# Patient Record
Sex: Male | Born: 1965 | Race: Black or African American | Hispanic: No | Marital: Married | Smoking: Former smoker
Health system: Southern US, Community
[De-identification: ages and names within clinical notes are randomized; demographics above are authoritative.]

## PROBLEM LIST (undated history)

## (undated) DIAGNOSIS — D472 Monoclonal gammopathy: Secondary | ICD-10-CM

## (undated) DIAGNOSIS — E669 Obesity, unspecified: Secondary | ICD-10-CM

## (undated) DIAGNOSIS — E785 Hyperlipidemia, unspecified: Secondary | ICD-10-CM

## (undated) DIAGNOSIS — I10 Essential (primary) hypertension: Secondary | ICD-10-CM

## (undated) HISTORY — PX: EYE SURGERY: SHX253

## (undated) HISTORY — DX: Hyperlipidemia, unspecified: E78.5

## (undated) HISTORY — DX: Obesity, unspecified: E66.9

## (undated) HISTORY — DX: Essential (primary) hypertension: I10

## (undated) HISTORY — PX: TONSILLECTOMY: SUR1361

## (undated) HISTORY — DX: Monoclonal gammopathy: D47.2

## (undated) HISTORY — PX: APPENDECTOMY: SHX54

---

## 2000-11-13 ENCOUNTER — Emergency Department (HOSPITAL_COMMUNITY): Admission: EM | Admit: 2000-11-13 | Discharge: 2000-11-13 | Payer: Self-pay

## 2001-05-12 ENCOUNTER — Inpatient Hospital Stay (HOSPITAL_COMMUNITY): Admission: EM | Admit: 2001-05-12 | Discharge: 2001-05-13 | Payer: Self-pay | Admitting: Emergency Medicine

## 2001-05-12 ENCOUNTER — Encounter: Payer: Self-pay | Admitting: Emergency Medicine

## 2001-05-12 ENCOUNTER — Encounter (INDEPENDENT_AMBULATORY_CARE_PROVIDER_SITE_OTHER): Payer: Self-pay | Admitting: *Deleted

## 2001-12-08 ENCOUNTER — Emergency Department (HOSPITAL_COMMUNITY): Admission: EM | Admit: 2001-12-08 | Discharge: 2001-12-08 | Payer: Self-pay | Admitting: Emergency Medicine

## 2006-12-20 ENCOUNTER — Ambulatory Visit (HOSPITAL_BASED_OUTPATIENT_CLINIC_OR_DEPARTMENT_OTHER): Admission: RE | Admit: 2006-12-20 | Discharge: 2006-12-20 | Payer: Self-pay | Admitting: Ophthalmology

## 2007-04-18 ENCOUNTER — Ambulatory Visit (HOSPITAL_BASED_OUTPATIENT_CLINIC_OR_DEPARTMENT_OTHER): Admission: RE | Admit: 2007-04-18 | Discharge: 2007-04-18 | Payer: Self-pay | Admitting: Ophthalmology

## 2008-12-16 ENCOUNTER — Ambulatory Visit: Payer: Self-pay | Admitting: Oncology

## 2008-12-24 LAB — CBC WITH DIFFERENTIAL/PLATELET
BASO%: 0.5 % (ref 0.0–2.0)
Eosinophils Absolute: 0.1 10*3/uL (ref 0.0–0.5)
HCT: 38.8 % (ref 38.4–49.9)
LYMPH%: 31.7 % (ref 14.0–49.0)
MCHC: 34.5 g/dL (ref 32.0–36.0)
MONO#: 0.5 10*3/uL (ref 0.1–0.9)
NEUT#: 2.9 10*3/uL (ref 1.5–6.5)
Platelets: 292 10*3/uL (ref 140–400)
RBC: 4.23 10*6/uL (ref 4.20–5.82)
WBC: 5.2 10*3/uL (ref 4.0–10.3)
lymph#: 1.6 10*3/uL (ref 0.9–3.3)

## 2008-12-24 LAB — CHCC SMEAR

## 2008-12-25 ENCOUNTER — Ambulatory Visit (HOSPITAL_COMMUNITY): Admission: RE | Admit: 2008-12-25 | Discharge: 2008-12-25 | Payer: Self-pay | Admitting: Oncology

## 2008-12-26 LAB — SPEP & IFE WITH QIG
Albumin ELP: 49.8 % — ABNORMAL LOW (ref 55.8–66.1)
Beta 2: 2.6 % — ABNORMAL LOW (ref 3.2–6.5)
Beta Globulin: 4 % — ABNORMAL LOW (ref 4.7–7.2)
Gamma Globulin: 30.1 % — ABNORMAL HIGH (ref 11.1–18.8)
IgA: 163 mg/dL (ref 68–378)
IgM, Serum: 2790 mg/dL — ABNORMAL HIGH (ref 60–263)

## 2008-12-26 LAB — COMPREHENSIVE METABOLIC PANEL
ALT: 18 U/L (ref 0–53)
AST: 15 U/L (ref 0–37)
Alkaline Phosphatase: 54 U/L (ref 39–117)
BUN: 14 mg/dL (ref 6–23)
Creatinine, Ser: 1.13 mg/dL (ref 0.40–1.50)
Total Bilirubin: 0.4 mg/dL (ref 0.3–1.2)

## 2008-12-26 LAB — KAPPA/LAMBDA LIGHT CHAINS
Kappa:Lambda Ratio: 20.22 — ABNORMAL HIGH (ref 0.26–1.65)
Lambda Free Lght Chn: 1.37 mg/dL (ref 0.57–2.63)

## 2008-12-26 LAB — BETA 2 MICROGLOBULIN, SERUM: Beta-2 Microglobulin: 1.59 mg/L (ref 1.01–1.73)

## 2008-12-31 ENCOUNTER — Ambulatory Visit (HOSPITAL_COMMUNITY): Admission: RE | Admit: 2008-12-31 | Discharge: 2008-12-31 | Payer: Self-pay | Admitting: Oncology

## 2009-01-13 ENCOUNTER — Ambulatory Visit: Payer: Self-pay | Admitting: Oncology

## 2009-01-14 LAB — RHEUMATOID FACTOR: Rhuematoid fact SerPl-aCnc: <20 IU/mL (ref 0–20)

## 2009-01-14 LAB — HEPATITIS B SURFACE ANTIGEN: Hepatitis B Surface Ag: NEGATIVE

## 2009-01-14 LAB — HEPATITIS B SURFACE ANTIBODY,QUALITATIVE: Hep B S Ab: NEGATIVE

## 2009-05-08 ENCOUNTER — Ambulatory Visit: Payer: Self-pay | Admitting: Oncology

## 2009-05-13 LAB — CBC WITH DIFFERENTIAL/PLATELET
Basophils Absolute: 0 10*3/uL (ref 0.0–0.1)
Eosinophils Absolute: 0.1 10*3/uL (ref 0.0–0.5)
HCT: 39.5 % (ref 38.4–49.9)
LYMPH%: 29.4 % (ref 14.0–49.0)
MCV: 91.2 fL (ref 79.3–98.0)
MONO%: 9.9 % (ref 0.0–14.0)
NEUT#: 3.1 10*3/uL (ref 1.5–6.5)
NEUT%: 57.8 % (ref 39.0–75.0)
Platelets: 277 10*3/uL (ref 140–400)
RBC: 4.34 10*6/uL (ref 4.20–5.82)

## 2009-05-15 LAB — SPEP & IFE WITH QIG
Beta 2: 3.5 % (ref 3.2–6.5)
Beta Globulin: 4.3 % — ABNORMAL LOW (ref 4.7–7.2)
Gamma Globulin: 31.3 % — ABNORMAL HIGH (ref 11.1–18.8)
IgA: 147 mg/dL (ref 68–378)
IgM, Serum: 2320 mg/dL — ABNORMAL HIGH (ref 60–263)

## 2009-05-15 LAB — KAPPA/LAMBDA LIGHT CHAINS
Kappa free light chain: 44 mg/dL — ABNORMAL HIGH (ref 0.33–1.94)
Kappa:Lambda Ratio: 18.8 — ABNORMAL HIGH (ref 0.26–1.65)
Lambda Free Lght Chn: 2.34 mg/dL (ref 0.57–2.63)

## 2009-05-15 LAB — COMPREHENSIVE METABOLIC PANEL
Alkaline Phosphatase: 50 U/L (ref 39–117)
BUN: 16 mg/dL (ref 6–23)
CO2: 23 mEq/L (ref 19–32)
Creatinine, Ser: 1.03 mg/dL (ref 0.40–1.50)
Glucose, Bld: 167 mg/dL — ABNORMAL HIGH (ref 70–99)
Sodium: 133 mEq/L — ABNORMAL LOW (ref 135–145)
Total Bilirubin: 0.4 mg/dL (ref 0.3–1.2)

## 2009-11-13 ENCOUNTER — Ambulatory Visit: Payer: Self-pay | Admitting: Oncology

## 2009-11-17 LAB — CBC WITH DIFFERENTIAL/PLATELET
Basophils Absolute: 0 10*3/uL (ref 0.0–0.1)
EOS%: 0.8 % (ref 0.0–7.0)
Eosinophils Absolute: 0.1 10*3/uL (ref 0.0–0.5)
HGB: 12.9 g/dL — ABNORMAL LOW (ref 13.0–17.1)
MCH: 30.9 pg (ref 27.2–33.4)
MONO#: 0.6 10*3/uL (ref 0.1–0.9)
NEUT#: 3.7 10*3/uL (ref 1.5–6.5)
RDW: 14.3 % (ref 11.0–14.6)
WBC: 5.9 10*3/uL (ref 4.0–10.3)
lymph#: 1.6 10*3/uL (ref 0.9–3.3)

## 2009-11-19 LAB — COMPREHENSIVE METABOLIC PANEL
AST: 21 U/L (ref 0–37)
Albumin: 4.3 g/dL (ref 3.5–5.2)
BUN: 21 mg/dL (ref 6–23)
CO2: 22 mEq/L (ref 19–32)
Calcium: 9.7 mg/dL (ref 8.4–10.5)
Chloride: 100 mEq/L (ref 96–112)
Potassium: 4.3 mEq/L (ref 3.5–5.3)

## 2009-11-19 LAB — SPEP & IFE WITH QIG
Albumin ELP: 49 % — ABNORMAL LOW (ref 55.8–66.1)
Alpha-1-Globulin: 5.4 % — ABNORMAL HIGH (ref 2.9–4.9)
IgM, Serum: 3090 mg/dL — ABNORMAL HIGH (ref 60–263)

## 2009-11-19 LAB — KAPPA/LAMBDA LIGHT CHAINS: Lambda Free Lght Chn: 2.19 mg/dL (ref 0.57–2.63)

## 2009-11-27 ENCOUNTER — Ambulatory Visit (HOSPITAL_COMMUNITY): Admission: RE | Admit: 2009-11-27 | Discharge: 2009-11-27 | Payer: Self-pay | Admitting: Oncology

## 2010-05-17 ENCOUNTER — Ambulatory Visit: Payer: Self-pay | Admitting: Oncology

## 2010-05-26 LAB — CBC WITH DIFFERENTIAL/PLATELET
BASO%: 0.5 % (ref 0.0–2.0)
LYMPH%: 32.3 % (ref 14.0–49.0)
MCHC: 34.6 g/dL (ref 32.0–36.0)
MCV: 91.9 fL (ref 79.3–98.0)
MONO#: 0.5 10*3/uL (ref 0.1–0.9)
MONO%: 10.4 % (ref 0.0–14.0)
NEUT#: 2.5 10*3/uL (ref 1.5–6.5)
Platelets: 249 10*3/uL (ref 140–400)
RBC: 4.04 10*6/uL — ABNORMAL LOW (ref 4.20–5.82)
RDW: 15.8 % — ABNORMAL HIGH (ref 11.0–14.6)
WBC: 4.6 10*3/uL (ref 4.0–10.3)

## 2010-05-28 LAB — SPEP & IFE WITH QIG
Albumin ELP: 49.4 % — ABNORMAL LOW (ref 55.8–66.1)
IgA: 361 mg/dL (ref 68–378)
IgM, Serum: 3150 mg/dL — ABNORMAL HIGH (ref 60–263)
Total Protein, Serum Electrophoresis: 8.9 g/dL — ABNORMAL HIGH (ref 6.0–8.3)

## 2010-05-28 LAB — COMPREHENSIVE METABOLIC PANEL
ALT: 15 U/L (ref 0–53)
Albumin: 4.1 g/dL (ref 3.5–5.2)
Alkaline Phosphatase: 52 U/L (ref 39–117)
Potassium: 4.2 mEq/L (ref 3.5–5.3)
Sodium: 137 mEq/L (ref 135–145)
Total Bilirubin: 0.4 mg/dL (ref 0.3–1.2)
Total Protein: 8.9 g/dL — ABNORMAL HIGH (ref 6.0–8.3)

## 2010-11-24 ENCOUNTER — Encounter (HOSPITAL_BASED_OUTPATIENT_CLINIC_OR_DEPARTMENT_OTHER): Payer: 59 | Admitting: Oncology

## 2010-11-24 ENCOUNTER — Other Ambulatory Visit: Payer: Self-pay | Admitting: Oncology

## 2010-11-24 DIAGNOSIS — R059 Cough, unspecified: Secondary | ICD-10-CM

## 2010-11-24 DIAGNOSIS — R0609 Other forms of dyspnea: Secondary | ICD-10-CM

## 2010-11-24 DIAGNOSIS — D892 Hypergammaglobulinemia, unspecified: Secondary | ICD-10-CM

## 2010-11-24 DIAGNOSIS — D472 Monoclonal gammopathy: Secondary | ICD-10-CM

## 2010-11-24 DIAGNOSIS — R0989 Other specified symptoms and signs involving the circulatory and respiratory systems: Secondary | ICD-10-CM

## 2010-11-24 DIAGNOSIS — R05 Cough: Secondary | ICD-10-CM

## 2010-11-24 LAB — CBC WITH DIFFERENTIAL/PLATELET
Basophils Absolute: 0 10*3/uL (ref 0.0–0.1)
HCT: 35.5 % — ABNORMAL LOW (ref 38.4–49.9)
HGB: 12.2 g/dL — ABNORMAL LOW (ref 13.0–17.1)
MONO#: 0.4 10*3/uL (ref 0.1–0.9)
NEUT%: 49.9 % (ref 39.0–75.0)
WBC: 4.1 10*3/uL (ref 4.0–10.3)
lymph#: 1.5 10*3/uL (ref 0.9–3.3)

## 2010-11-26 LAB — COMPREHENSIVE METABOLIC PANEL
ALT: 17 U/L (ref 0–53)
BUN: 18 mg/dL (ref 6–23)
CO2: 24 mEq/L (ref 19–32)
Calcium: 10.1 mg/dL (ref 8.4–10.5)
Chloride: 100 mEq/L (ref 96–112)
Creatinine, Ser: 1.15 mg/dL (ref 0.40–1.50)

## 2010-11-26 LAB — SPEP & IFE WITH QIG
Alpha-1-Globulin: 3.1 % (ref 2.9–4.9)
Alpha-2-Globulin: 6.7 % — ABNORMAL LOW (ref 7.1–11.8)
Beta Globulin: 3.8 % — ABNORMAL LOW (ref 4.7–7.2)
IgG (Immunoglobin G), Serum: 1270 mg/dL (ref 694–1618)
M-Spike, %: 2.71 g/dL
Total Protein, Serum Electrophoresis: 9.5 g/dL — ABNORMAL HIGH (ref 6.0–8.3)

## 2010-11-26 LAB — KAPPA/LAMBDA LIGHT CHAINS
Kappa free light chain: 76.7 mg/dL — ABNORMAL HIGH (ref 0.33–1.94)
Kappa:Lambda Ratio: 35.51 — ABNORMAL HIGH (ref 0.26–1.65)

## 2011-01-18 NOTE — Op Note (Signed)
[name], Edward Clarke             ACCOUNT NO.:  [id number]   MEDICAL RECORD NO.:  [id number]          PATIENT TYPE:  AMB   LOCATION:  [facility]                         FACILITY:  [facility]   PHYSICIAN:  Tyrone Apple. 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  This procedure is indicated to correct the  residual exotropia.  The risks and benefits of the procedure explained  to the patient prior to procedure and informed consent was obtained.   OPERATION PERFORMED:  A left superior rectus tuck, a left inferior  rectus tuck.   SURGEON:  Tyrone Apple. Karleen Hampshire, M.D.   ANESTHESIA:  General with intubation.   POSTOPERATIVE DIAGNOSIS:  Status post left inferior rectus, left  superior rectus tuck.   DESCRIPTION OF PROCEDURE:  The patient was taken to the operating room  and placed in supine position.  The entire face was prepped and draped  in the usual sterile manner.  After the induction of general anesthesia  and establishment of endotracheal airway, my attention was first  directed to the left eye.  A lid speculum was placed and forced duction  test performed and found to be positive. An incision was then made  inferiorly in the inferior fornix and taken down to the posterior  subtenons space in the direction along the transposed left inferior  rectus tendon.  Dissection was carried progressively posteriorly very  delicately and meticulously to eventually expose the transposed tendon  and expose the left medial rectus insertion.  Once the transposed tendon  was exposed and a mark was placed on the tendon at 4 mm from its  transposed position, it was then imbricated at this point with 6-0  Vicryl suture taking two locking bites at the  ends.  It was then tucked  to the transposed insertion with the preplaced sutures.  Attention was  then directed to the left superior rectus and incision was made in the  superior medial fornix and taken down to the posterior subtenon's space  and the left superior rectus transposed tendon was carefully and  meticulously exposed in its transposed position for a distance of  approximately 4 mm.  It was then imbricated at this point on 6-0 Vicryl  suture taking two locking bites in the ends and then the transposed  tendon was then tucked on itself and reattached by the tuck to its  transposed position.  The suture was tied securely.  The conjunctiva was  repositioned.  At the conclusion of the procedure, TobraDex ointment was  instilled in inferior fornices of the left eye and there were no  apparent complications.      Casimiro Needle A. Karleen Hampshire, M.D.  Electronically Signed     MAS/MEDQ  D:  04/18/2007  T:  04/19/2007  Job:  [id number]

## 2011-01-21 NOTE — Op Note (Signed)
[place]  Patient:    Edward Clarke, Edward Clarke Visit Number: [id number] MRN: [id number]          Service Type: SUR Location: [id number] Attending Physician:  Velora Heckler Md Proc. Date: 05/12/01 Admit Date:  05/12/2001 Discharge Date: 05/13/2001                             Operative Report  PREOPERATIVE DIAGNOSIS:  Acute appendicitis.  POSTOPERATIVE DIAGNOSIS:  Acute appendicitis.  PROCEDURE: 1. Laparoscopic appendectomy. 2. Repair of small bowel enterotomy, laparoscopic technique.  SURGEON:  Velora Heckler, M.D.  ANESTHESIA:  General per Janetta Hora. Gelene Mink, M.D.  ESTIMATED BLOOD LOSS:  Minimal.  PREPARATION:  Betadine.  COMPLICATIONS:  Inadvertent enterotomy to small bowel loop which became entrapped between stapling device and left lower quadrant port.  Enterotomy was identified on the antimesenteric border, and was successfully closed using a laparoscopic stapling device.  INDICATIONS:  The patient is a 45 year old black male who presents to the emergency department with less than 24 hour history of abdominal pain localizing to the right lower quadrant.  White blood cell count was minimally elevated at 10.6, but with a significant left shift on differential.  CT scan of the abdomen identified findings consistent with acute appendicitis.  The patient is now brought to the operating room for appendectomy.  DESCRIPTION OF PROCEDURE:  The procedure was done in OR #17 at the [facility].  The patient was brought to the operating room and placed in the supine position on the operating room table.  Following the administration of general anesthesia, the patient was prepped and draped in the usual strict aseptic fashion.  After ascertaining that an adequate level of anesthesia had been obtained, a supraumbilical incision was made in the midline with a #11 blade.  Dissection was carried down through the subcutaneous  tissues.  The fascia was incised in the midline and the peritoneal cavity was entered cautiously.  An 0 Vicryl pursestring suture was placed in the fascia.  A Hasson cannula was introduced and secured with the pursestring suture.  The abdomen was insufflated with carbon dioxide.  The laparoscope was introduced and the abdomen explored.  A operative port was placed in the right upper quadrant, just below the costal margin.  Using a Glassman clamp, the cecum was mobilized.  There is an obvious inflamed, indurated appendix without evidence of perforation.  It is freely mobile.  An operative port was placed in the left lower quadrant using a 12 mm port.  A window was created at the base of the appendix.  The Endo-GIA stapling device was inserted and used to transect the base of the appendix.  The mesentery was mobilized and dissected out carefully using the electrocautery for hemostasis. A second load of the Endo-GIA with a vascular cartdrige is inserted and used to partially transect the appendiceal mesentery.  On withdrawal of the stapler from the peritoneal cavity, there is some resistance.  After removal of the stapler, and inspection of the port site, a loop of small bowel was identified and having been entrapped between the stapling device and the left lower quadrant port.  A small less than 1 cm enterotomy had been created on the antimesenteric border of the loop of small bowel.  Next, the Endo-GIA was reinserted with another vascular low cartdrige.  This was used to complete transection of the appendiceal mesentery.  The appendix  was then placed into an Endocatch bag and left in the right lower quadrant.  Next, the Endo-GIA was reinserted with a regular Endo-GIA cartridge.  The antimesenteric border enterotomy on the small bowel is grasped with a Glassman clamp and elevated.  The Endo-GIA was positioned across the antimesenteric border of the small bowel just below the enterotomy and  fired successfully. There appears to be good staple closure of the enterotomy.  The damaged tissue is excised completely.  There is no compromise of the small bowel lumen.  Good hemostasis was noted along the staple line.  Both the enterotomy closure and the site of appendectomy were irrigated copiously with warm saline which was evacuated.  Good hemostasis was noted on all staple lines.  The appendix was then extracted in the Endocatch bag through the left lower quadrant port and the port removed.  The appendix was submitted to pathology for review.  The port sites are examined as ports are removed for hemostasis.  Pneumoperitoneum was released and all ports were removed.  All three port sites are anesthetized with local anesthetic.  All the 0 Vicryl pursestring sutures tied securely.  All three wounds were closed with interrupted 4-0 Vicryl subcuticular sutures.  The wounds were washed and dried, and Benzoin and Steri-Strips were applied.  Sterile gauze dressings are applied.  The patient was awakened from anesthesia and brought to the recovery room in stable condition.  The patient tolerated the procedure well. Attending Physician:  Velora Heckler Md DD:  05/12/01 TD:  05/13/01 Job: [id number]

## 2011-01-21 NOTE — Op Note (Signed)
Edward Clarke, Edward Clarke             ACCOUNT NO.:  [id number]   MEDICAL RECORD NO.:  [id number]          PATIENT TYPE:  AMB   LOCATION:  [facility]                         FACILITY:  [facility]   PHYSICIAN:  Tyrone Apple. Karleen Hampshire, M.D.DATE OF BIRTH:  [birth year]   DATE OF PROCEDURE:  12/20/2006  DATE OF DISCHARGE:                               OPERATIVE REPORT   PREOPERATIVE DIAGNOSIS:  Left Doyne syndrome.   POSTOPERATIVE DIAGNOSIS:  Left Doyne syndrome.   OPERATIONS:  Full tendon transposition of the left inferior rectus and  left superior rectus to the left medial rectus on the spiral of Tillaux  with Malen Gauze augmentation, right lateral rectus resection with posterior  pulley fixation sutures;left lateral rectus recession with y-splitting.   SURGEON:  Tyrone Apple. Karleen Hampshire, M.D.   ANESTHESIA:  General with laryngeal mask airway.   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  This procedure is indicated to restore alignment of the visual  axis and restore single binocular vision and also to restore normal  ductions and versions in his ocular movements.  The risks and benefits  of the procedure were explained to the patient prior to the procedure  and informed consent was obtained.   DESCRIPTION OF PROCEDURE:  The patient was taken into the operating room  and placed in the supine position.  The entire face was prepped and  draped in the usual sterile manner.  After the induction of general  anesthesia and establishment of endotracheal intubation, our attention  was first directed to the right eye.  A lid speculum was placed and  forced duction tests were performed and found to be negative.  The globe  was then held in the inferior temporal quadrant.  The eye was elevated  and adducted.  An incision was made through the inferior  temporal  fornix, taken down to the posterior sub Tenons space.  The left lateral  rectus muscle was then isolated on a Stevens hook and subsequently a  Green hook.  The muscle tendon was then carefully dissected free from  its overlying muscle fascia and intramuscular septum for a distance of  approximately 16 mm.  The posterior pulley apparatus was then engaged  under direct visualization with two Stevens hooks and incorporated into  a 6-0 Vicryl suture.  This was done on the superior side of the lateral  rectus muscle at a distance of 16 mm.  The same suture was then used to  incorporate approximately 4 mm of the tendon at this point.  The suture  was tied securely.  Attention was directed to the contralateral side  where the posterior pulley fixation apparatus from the superior portion  of the same tendon was engaged on a 6-0 Vicryl suture.  It was  incorporated with the ipsilateral 4 mm segment of the tendon.  The  suture was tied securely.  The conjunctiva was repositioned.  My  attention was then directed to the recession.  The right lateral rectus  tendon was then imbricated on a 6-0 Vicryl suture taking two locking  bites of the ends.  It was then recessed exactly 5 mm and reattached to  the globe using the preplaced suture and the suture tied securely and  conjunctiva was repositioned.  My attention was then directed to the  left eye.  A lid speculum was placed.  Forced duction tests were  performed and found to be positive to adduction of that eye.  The globe  was held in the inferior nasal quadrant.  An incision was made through  the inferior nasal fornix and taken down to the posterior sub Tenon  space.  The left medial rectus muscle was isolated on a Stevens hook.  Next, the left inferior rectus muscle was then isolated on a Stevens  hook and subsequently a Green hook.  It was carefully dissected free  from its overlying muscle fascia and intramuscular septum for a distance   of approximately 18 mm taking care to carefully dissect free the capsule  palpebral fascia from the tendon.  A mark was then placed on the tendon  at 16 mm from its insertion and a 6-0 Vicryl suture was placed  incorporating the ipsilateral 4 mm segment of the tendon at this point.  This was placed on a Steri-Strip.  Next, the tendon was imbricated on a  6-0 Vicryl suture taking two locking bites at its insertion.  It was  then detached from the globe and the sutures were placed on a Steri-  Strip.  My attention was then directed to the superior rectus.  An  incision was made in the superior nasal fornix, taken down to the  posterior sub Tenon space and the left superior rectus tendon was then  carefully dissected free from its overlying muscle fascia and  intramuscular septum after being engaged on a Stevens hook and  subsequently on a Green hook.  The superior oblique tendon was also  avoided during this maneuver and the tendon was exposed free of the  superior oblique.  A mark was then placed on the tendon at 16 mm from  its insertion.  A 6-0 Vicryl suture was then placed at the 16 mm mark of  the ipsilateral aspect of the tendon.  This suture was placed on a  serrefine.  Next, the superior rectus was then imbricated on a 6-0  Vicryl suture taking two locking bites of the ends.  It was then  detached from the globe and transposed on the spiral of Tillaux at the  superior insertion of the ipsilateral medial rectus muscle.  Once the  transposition was completed, the preplaced 6-0 Vicryl suture at 16 mm of  the ipsilateral side was then used to incorporate a 4 mm segment of the  medial rectus tendon in the same position and this suture was tied  securely thereby completing the augmentation.  Next, the inferior rectus  tendon was then transposed to the inferior aspect of the medial rectus tendon.  The sutures were tied securely.  The augmentation of the  preplaced suture at 16 mm mark was  completed of the inferior rectus and  the medial rectus.  The conjunctiva was repositioned.  My attention was  then directed to the left lateral rectus tendon.  The globe was held in  the inferior temporal quadrant.  The eye was elevated and adducted.  Incision was made through the inferior temporal fornix, taken down to  the posterior sub  Tenon space.  The left lateral rectus tendon was then  isolated on a Stevens hook and subsequently on a Green hook.  This  tendon was found to be extremely tight, perhaps 4+ tightness with  multiple fibrotic bands from the insertion at the lateral rectus to the  posterior aspect of the orbit.  These bands were transected and the  tendon was exposed for a distance of approximately 16 mm.  It was then  imbricated on two 6-0 Vicryl sutures at each suture bisecting the tendon  into 5 mm segments.  The tendon was then transected free from the globe.  It was then bifurcated with sharp dissection for a distance of  approximately 16 mm.  It was then reattached to the globe in a recess  position approximately 20 mm separating between the two heads, recessed  approximately 5 mm from the native insertion of the lateral rectus  muscle.  The suture was tied securely and the conjunctiva was  repositioned.  At the conclusion of the procedure, TobraDex ointment was  instilled on the inferior fornices of both eyes.  There were no apparent  complications.      Casimiro Needle A. Karleen Hampshire, M.D.  Electronically Signed     MAS/MEDQ  D:  12/20/2006  T:  12/20/2006  Job:  [id number]

## 2011-01-21 NOTE — H&P (Signed)
[place]  Patient:    Edward Clarke, Edward Clarke Visit Number: [id number] MRN: [id number]          Service Type: SUR Location: [id number] Attending Physician:  Bonnetta Barry Dictated by:   Velora Heckler, M.D. Admit Date:  05/12/2001   CC:         Dr. _______   History and Physical  REASON FOR ADMISSION:  Acute appendicitis.  REFERRING PHYSICIAN:  Dr. ______ .  BRIEF HISTORY OF PRESENT ILLNESS:  The patient is a 45 year old black male who presents to the emergency department with less than 24-hour history of lower abdominal pain localizing to the right lower quadrant. This was associated with nausea. The patient notes chills, although he did not take his temperature at home. The patient had a similar such episode 11 months ago which resolved spontaneously. On examination, the patient was felt to have right lower quadrant tenderness. White blood cell count was minimally elevated at 10.6, but differential showed 87% segmented neutrophils. CT scan of the abdomen and pelvis was obtained which demonstrated findings consistent with acute appendicitis with a thickened 2 cm appendix in the right lower quadrant which did not fill with rectal contrast. General surgery is consulted now for appendectomy.  PAST MEDICAL HISTORY/PAST SURGICAL HISTORY:  Status post tonsillectomy, history of depression.  CURRENT MEDICATIONS:  Zoloft.  ALLERGIES:  None known.  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 He smokes a pack of cigarettes a day. 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 He does not drink alcohol. He is accompanied by his wife and stepchild.  FAMILY HISTORY:  Notable for diabetes in the patients mother.  REVIEW OF SYSTEMS:  Fifteen system review without significant other abnormality.  PHYSICAL EXAMINATION:  GENERAL:  The patient is a 45 year old, well-developed, well-nourished, black male  on a stretcher in the emergency department in mild to moderate discomfort.  VITAL SIGNS:  Temperature 98.4, pulse 72, respirations 20, blood pressure 148/89, O2 saturation 97%.  HEENT:  Normocephalic, atraumatic. Sclerae are clear. Dentition is good.  NECK:  Supple without masses. Thyroid is normal without nodularity.  LUNGS:  Clear to auscultation bilaterally.  CARDIAC:  Regular rate and rhythm without murmur.  EXTREMITIES:  Peripheral pulses are full. Nontender without edema.  ABDOMEN:  Soft. There are no surgical wounds. There are no bowel sounds present. There is tenderness to percussion across the lower abdomen but maximal at McBurneys point. There is tenderness to palpation in the right lower quadrant. There is mild rebound tenderness in the right lower quadrant. There is referred rebound tenderness from the left lower quadrant. There is no palpable mass.  GENITOURINARY:  Normal.  NEUROLOGICAL:  The patient is alert and oriented to person, place, and time without focal neurologic deficit.  LABORATORY DATA:  Complete blood count shows white count 10.6, hemoglobin 16, hematocrit 46.6%, platelet count 328,000. Differential shows 87% neutrophils, 10% lymphocytes. Chemistry profile is notable only for a glucose of 155, creatinine is normal at 0.9. Urinalysis is benign. Micro on urinalysis shows 7 to 10 white cells per high powered field and 0 to 2 red cells per high powered field.  CT scan abdomen and pelvis read by Sherrine Maples T. Fredia Sorrow, M.D. shows thickened appendix approximately 2 cm consistent with appendicitis. The appendix does not fill with rectal contrast.  IMPRESSION:  Probable acute appendicitis.  PLAN:  I had a lengthy discussion with the patient and his wife  regarding the indications for surgery. I explained laparoscopic versus open appendectomy. I quoted them approximately a 75 to 80% success rate with laparoscopic technique but a 20 to 25% chance that he would  need to be converted to an open procedure. We discussed potential postoperative complications including bleeding and infection. We discussed the potential for the need of open wound care. The patient and his wife understand and wish to proceed. We will make arrangements with the operating room for laparoscopic appendectomy this morning. Dictated by:   Velora Heckler, M.D. Attending Physician:  Bonnetta Barry DD:  05/12/01 TD:  05/12/01 Job: [id number]

## 2011-06-20 LAB — POCT I-STAT 4, (NA,K, GLUC, HGB,HCT)
Potassium: 4.5
Sodium: 139

## 2011-08-18 ENCOUNTER — Telehealth: Payer: Self-pay | Admitting: Oncology

## 2011-08-18 NOTE — Telephone Encounter (Signed)
Lvm advising appt 10/04/11 @ 3.30pm.

## 2011-09-27 ENCOUNTER — Encounter: Payer: Self-pay | Admitting: *Deleted

## 2011-10-03 ENCOUNTER — Telehealth: Payer: Self-pay | Admitting: *Deleted

## 2011-10-03 ENCOUNTER — Telehealth: Payer: Self-pay | Admitting: Oncology

## 2011-10-03 NOTE — Telephone Encounter (Signed)
pt called and asked that his appt on 01-29 be cancelled.  pt states that he will rtn call to r/s once he has insurance

## 2011-10-03 NOTE — Telephone Encounter (Signed)
Received call from pt stating that he has an appt in our office tomorrow 10/04/11 but he called months ago to cancel the appt due to change in insurance. Pt states he believes he is going to have to get medicare/medicaid to cover office visits. Informed pt to notify our office when ready to r/s. Verbalized understanding. Will notify MD

## 2011-10-04 ENCOUNTER — Telehealth: Payer: Self-pay | Admitting: Oncology

## 2011-10-04 ENCOUNTER — Other Ambulatory Visit: Payer: 59 | Admitting: Lab

## 2011-10-04 ENCOUNTER — Ambulatory Visit: Payer: 59 | Admitting: Oncology

## 2011-10-04 NOTE — Telephone Encounter (Signed)
Pt cx'd 1/29 appt. Per message from nurse called pt to see when he wants to r/s appt for. lmonvm asking pt to call office to let us know when he would like to come in,

## 2012-12-12 DIAGNOSIS — E7800 Pure hypercholesterolemia, unspecified: Secondary | ICD-10-CM | POA: Diagnosis not present

## 2012-12-12 DIAGNOSIS — I10 Essential (primary) hypertension: Secondary | ICD-10-CM | POA: Diagnosis not present

## 2012-12-12 DIAGNOSIS — D472 Monoclonal gammopathy: Secondary | ICD-10-CM | POA: Diagnosis not present

## 2012-12-12 DIAGNOSIS — H349 Unspecified retinal vascular occlusion: Secondary | ICD-10-CM | POA: Diagnosis not present

## 2012-12-12 DIAGNOSIS — IMO0001 Reserved for inherently not codable concepts without codable children: Secondary | ICD-10-CM | POA: Diagnosis not present

## 2012-12-14 ENCOUNTER — Other Ambulatory Visit: Payer: Self-pay | Admitting: *Deleted

## 2012-12-14 ENCOUNTER — Telehealth: Payer: Self-pay | Admitting: Oncology

## 2012-12-14 NOTE — Progress Notes (Signed)
Patient calling to say he now has insurance coverage and would like to re-schedule cancelled appts with dr Clelia Croft. pof to schedulers for 1st available. Patient notified that it will be sometime in may.

## 2012-12-20 DIAGNOSIS — H348192 Central retinal vein occlusion, unspecified eye, stable: Secondary | ICD-10-CM | POA: Diagnosis not present

## 2012-12-20 DIAGNOSIS — H3581 Retinal edema: Secondary | ICD-10-CM | POA: Diagnosis not present

## 2012-12-31 DIAGNOSIS — H348192 Central retinal vein occlusion, unspecified eye, stable: Secondary | ICD-10-CM | POA: Diagnosis not present

## 2013-01-08 DIAGNOSIS — D649 Anemia, unspecified: Secondary | ICD-10-CM | POA: Diagnosis not present

## 2013-01-08 DIAGNOSIS — IMO0001 Reserved for inherently not codable concepts without codable children: Secondary | ICD-10-CM | POA: Diagnosis not present

## 2013-01-10 DIAGNOSIS — IMO0001 Reserved for inherently not codable concepts without codable children: Secondary | ICD-10-CM | POA: Diagnosis not present

## 2013-01-10 DIAGNOSIS — E7800 Pure hypercholesterolemia, unspecified: Secondary | ICD-10-CM | POA: Diagnosis not present

## 2013-01-10 DIAGNOSIS — G4730 Sleep apnea, unspecified: Secondary | ICD-10-CM | POA: Diagnosis not present

## 2013-01-10 DIAGNOSIS — I10 Essential (primary) hypertension: Secondary | ICD-10-CM | POA: Diagnosis not present

## 2013-01-10 DIAGNOSIS — D649 Anemia, unspecified: Secondary | ICD-10-CM | POA: Diagnosis not present

## 2013-01-10 DIAGNOSIS — D472 Monoclonal gammopathy: Secondary | ICD-10-CM | POA: Diagnosis not present

## 2013-01-11 ENCOUNTER — Telehealth: Payer: Self-pay | Admitting: Oncology

## 2013-01-18 ENCOUNTER — Other Ambulatory Visit (HOSPITAL_BASED_OUTPATIENT_CLINIC_OR_DEPARTMENT_OTHER): Payer: Medicare Other

## 2013-01-18 ENCOUNTER — Telehealth: Payer: Self-pay | Admitting: Oncology

## 2013-01-18 ENCOUNTER — Other Ambulatory Visit: Payer: Self-pay | Admitting: Oncology

## 2013-01-18 ENCOUNTER — Ambulatory Visit (HOSPITAL_BASED_OUTPATIENT_CLINIC_OR_DEPARTMENT_OTHER): Payer: Medicare Other | Admitting: Oncology

## 2013-01-18 ENCOUNTER — Encounter (HOSPITAL_COMMUNITY)
Admission: RE | Admit: 2013-01-18 | Discharge: 2013-01-18 | Disposition: A | Discharge status: Home / Self Care | Payer: Medicare Other | Source: Ambulatory Visit | Attending: Oncology | Admitting: Oncology

## 2013-01-18 ENCOUNTER — Ambulatory Visit (HOSPITAL_BASED_OUTPATIENT_CLINIC_OR_DEPARTMENT_OTHER): Payer: Medicare Other

## 2013-01-18 VITALS — BP 148/92 | HR 66 | Temp 97.1°F | Resp 18

## 2013-01-18 VITALS — BP 117/78 | HR 83 | Temp 96.9°F | Resp 18 | Ht 73.0 in | Wt 305.0 lb

## 2013-01-18 DIAGNOSIS — D539 Nutritional anemia, unspecified: Secondary | ICD-10-CM | POA: Diagnosis not present

## 2013-01-18 DIAGNOSIS — C8583 Other specified types of non-Hodgkin lymphoma, intra-abdominal lymph nodes: Secondary | ICD-10-CM | POA: Diagnosis not present

## 2013-01-18 DIAGNOSIS — D729 Disorder of white blood cells, unspecified: Secondary | ICD-10-CM

## 2013-01-18 DIAGNOSIS — D7289 Other specified disorders of white blood cells: Secondary | ICD-10-CM

## 2013-01-18 DIAGNOSIS — D721 Eosinophilia, unspecified: Secondary | ICD-10-CM | POA: Insufficient documentation

## 2013-01-18 DIAGNOSIS — D472 Monoclonal gammopathy: Secondary | ICD-10-CM

## 2013-01-18 LAB — ABO/RH: ABO/RH(D): O POS

## 2013-01-18 LAB — CBC WITH DIFFERENTIAL/PLATELET
BASO%: 0.3 % (ref 0.0–2.0)
Basophils Absolute: 0 10*3/uL (ref 0.0–0.1)
EOS%: 2 % (ref 0.0–7.0)
HCT: 21.4 % — ABNORMAL LOW (ref 38.4–49.9)
HGB: 7.4 g/dL — ABNORMAL LOW (ref 13.0–17.1)
LYMPH%: 27 % (ref 14.0–49.0)
MCH: 32.3 pg (ref 27.2–33.4)
MCHC: 34.7 g/dL (ref 32.0–36.0)
MONO#: 0.5 10*3/uL (ref 0.1–0.9)
NEUT%: 59.6 % (ref 39.0–75.0)
Platelets: 183 10*3/uL (ref 140–400)
lymph#: 1.1 10*3/uL (ref 0.9–3.3)

## 2013-01-18 LAB — COMPREHENSIVE METABOLIC PANEL (CC13)
ALT: 18 U/L (ref 0–55)
BUN: 16.7 mg/dL (ref 7.0–26.0)
CO2: 22 mEq/L (ref 22–29)
Calcium: 11.4 mg/dL — ABNORMAL HIGH (ref 8.4–10.4)
Chloride: 99 mEq/L (ref 98–107)
Creatinine: 1 mg/dL (ref 0.7–1.3)
Total Bilirubin: 0.3 mg/dL (ref 0.20–1.20)

## 2013-01-18 MED ORDER — SODIUM CHLORIDE 0.9 % IV SOLN
250.0000 mL | Freq: Once | INTRAVENOUS | Status: AC
  Administered 2013-01-18: 250 mL via INTRAVENOUS

## 2013-01-18 NOTE — Progress Notes (Signed)
Hematology and Oncology Follow Up Visit  Edward Clarke [id number] [birth year] 47 y.o. 01/18/2013 10:21 AM No primary provider on file.No ref. 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  There is no evidence to suggest multiple myeloma with his work up at that time.  He had presented with IgM about 3 g/dL and M-spike about 2 g/dL.  His workup that includes imaging studies, CT scan of the chest, abdomen and pelvis and skeletal survey had been really unrevealing.  Hepatitis workup has also been unrevealing as well as his HIV. He was was supposed to have a bone marrow biopsy but failed to show up in the last 3 years.    Interim History:  Edward Clarke presents today for a follow-up visit.  He is a very nice man I saw back in 2011 and have not been back due to lost insurance. He has been doing poorly since I saw him last in 2011. He is reporting weakness, fatigue and DOE.  He has not reported any major appetite changes.  He has not reported any major chest pain or difficulty breathing.  He has not reported any alternation in his mental status.  He did not report any neurological symptoms.  Overall, performance status and activity level remained reasonable. 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   Medications: I have reviewed the patient's current medications.  Current Outpatient Prescriptions  Medication Sig Dispense Refill  . ezetimibe-simvastatin (VYTORIN) 10-80 MG per tablet Take 1 tablet by mouth at bedtime.      [name] glimepiride (AMARYL) 4 MG tablet Take 4 mg by mouth daily before breakfast.      . insulin aspart (NOVOLOG) 100 UNIT/ML injection Inject 18 Units into the skin at bedtime. 5-20 units      . Insulin Glargine (LANTUS SOLOSTAR) 100 UNIT/ML SOPN Inject 32 Units into the skin daily.      [name] lisinopril (PRINIVIL,ZESTRIL) 10 MG tablet Take 10 mg by mouth daily.      . metFORMIN  (GLUCOPHAGE-XR) 500 MG 24 hr tablet Take 1,000 mg by mouth 2 (two) times daily.       . simvastatin (ZOCOR) 40 MG tablet Take 40 mg by mouth every evening.       No current facility-administered medications for this visit.     Allergies:  Allergies  Allergen Reactions  . Benazepril     Past Medical History, Surgical history, Social history, and Family History were reviewed and updated.  Review of Systems: Constitutional:  Negative for fever, chills, night sweats, anorexia, weight loss, pain. Cardiovascular: no chest pain or dyspnea on exertion Respiratory: negative Neurological: negative Dermatological: negative ENT: negative Skin: Negative. Gastrointestinal: negative Genito-Urinary: negative Hematological and Lymphatic: negative Breast: negative Musculoskeletal: negative Remaining ROS negative. Physical Exam: Blood pressure 117/78, pulse 83, temperature 96.9 F (36.1 C), temperature source Oral, resp. rate 18, height 6\' 1"  (1.854 m), weight 305 lb (138.347 kg). ECOG:  General appearance: alert Head: Normocephalic, without obvious abnormality, atraumatic Neck: no adenopathy, no carotid bruit, no JVD, supple, symmetrical, trachea midline and thyroid not enlarged, symmetric, no tenderness/mass/nodules Lymph nodes: Cervical, supraclavicular, and axillary nodes normal. Heart:regular rate and rhythm, S1, S2 normal, no murmur, click, rub or gallop Lung:chest clear, no wheezing, rales, normal symmetric air entry Abdomin: soft, non-tender, without masses or organomegaly EXT:no erythema, induration, or nodules   Lab Results: Lab Results  Component Value Date   WBC 4.1 01/18/2013  HGB 7.4* 01/18/2013   HCT 21.4* 01/18/2013   MCV 92.9 01/18/2013   PLT 183 01/18/2013     Chemistry      Component Value Date/Time   NA 134* 11/24/2010 1320   K 4.7 11/24/2010 1320   CL 100 11/24/2010 1320   CO2 24 11/24/2010 1320   BUN 18 11/24/2010 1320   CREATININE 1.15 11/24/2010 1320       Component Value Date/Time   CALCIUM 10.1 11/24/2010 1320   ALKPHOS 51 11/24/2010 1320   AST 18 11/24/2010 1320   ALT 17 11/24/2010 1320   BILITOT 0.4 11/24/2010 1320         Impression and Plan:  This is a pleasant 47 year old gentleman with the following issues: 1. IgM monoclonal paraproteinemia.  Again, the differential diagnosis includes lymphoproliferative disorder such as Waldenstrm's.  I do not think he has multiple myeloma.  His CT scan images from 2011 have been normal at this time. The plan at this time is to repeat his CT scans and a bone marrow biopsy to determine the etiology of his hematological disease.  2. Anemia: likely related to #1. I will set him up for a PRBC transfusion and quick follow up to discuss the result of his work up.   [facility], MD 5/16/201410:21 AM

## 2013-01-18 NOTE — Telephone Encounter (Signed)
gv and printeda ppt sched and avs for pt.Marland KitchenMarland KitchenKasie added Blood tx....gv pt barium

## 2013-01-18 NOTE — Patient Instructions (Addendum)
Blood Transfusion Information WHAT IS A BLOOD TRANSFUSION? A transfusion is the replacement of blood or some of its parts. Blood is made up of multiple cells which provide different functions.  Red blood cells carry oxygen and are used for blood loss replacement.  White blood cells fight against infection.  Platelets control bleeding.  Plasma helps clot blood.  Other blood products are available for specialized needs, such as hemophilia or other clotting disorders. BEFORE THE TRANSFUSION  Who gives blood for transfusions?   You may be able to donate blood to be used at a later date on yourself (autologous donation).  Relatives can be asked to donate blood. This is generally not any safer than if you have received blood from a stranger. The same precautions are taken to ensure safety when a relative's blood is donated.  Healthy volunteers who are fully evaluated to make sure their blood is safe. This is blood bank blood. Transfusion therapy is the safest it has ever been in the practice of medicine. Before blood is taken from a donor, a complete history is taken to make sure that person has no history of diseases nor engages in risky social behavior (examples are intravenous drug use or sexual activity with multiple partners). The donor's travel history is screened to minimize risk of transmitting infections, such as malaria. The donated blood is tested for signs of infectious diseases, such as HIV and hepatitis. The blood is then tested to be sure it is compatible with you in order to minimize the chance of a transfusion reaction. If you or a relative donates blood, this is often done in anticipation of surgery and is not appropriate for emergency situations. It takes many days to process the donated blood. RISKS AND COMPLICATIONS Although transfusion therapy is very safe and saves many lives, the main dangers of transfusion include:   Getting an infectious disease.  Developing a  transfusion reaction. This is an allergic reaction to something in the blood you were given. Every precaution is taken to prevent this. The decision to have a blood transfusion has been considered carefully by your caregiver before blood is given. Blood is not given unless the benefits outweigh the risks. AFTER THE TRANSFUSION  Right after receiving a blood transfusion, you will usually feel much better and more energetic. This is especially true if your red blood cells have gotten low (anemic). The transfusion raises the level of the red blood cells which carry oxygen, and this usually causes an energy increase.  The nurse administering the transfusion will monitor you carefully for complications. HOME CARE INSTRUCTIONS  No special instructions are needed after a transfusion. You may find your energy is better. Speak with your caregiver about any limitations on activity for underlying diseases you may have. SEEK MEDICAL CARE IF:   Your condition is not improving after your transfusion.  You develop redness or irritation at the intravenous (IV) site. SEEK IMMEDIATE MEDICAL CARE IF:  Any of the following symptoms occur over the next 12 hours:  Shaking chills.  You have a temperature by mouth above 102 F (38.9 C), not controlled by medicine.  Chest, back, or muscle pain.  People around you feel you are not acting correctly or are confused.  Shortness of breath or difficulty breathing.  Dizziness and fainting.  You get a rash or develop hives.  You have a decrease in urine output.  Your urine turns a dark color or changes to pink, red, or brown. Any of the following   symptoms occur over the next 10 days:  You have a temperature by mouth above 102 F (38.9 C), not controlled by medicine.  Shortness of breath.  Weakness after normal activity.  The white part of the eye turns yellow (jaundice).  You have a decrease in the amount of urine or are urinating less often.  Your  urine turns a dark color or changes to pink, red, or brown. Document Released: 08/19/2000 Document Revised: 11/14/2011 Document Reviewed: 04/07/2008 Exit[employer] Patient Information 2013 [employer].  

## 2013-01-20 LAB — TYPE AND SCREEN: Unit division: 0

## 2013-01-22 ENCOUNTER — Ambulatory Visit (HOSPITAL_COMMUNITY)
Admission: RE | Admit: 2013-01-22 | Discharge: 2013-01-22 | Disposition: A | Discharge status: Home / Self Care | Payer: Medicare Other | Source: Ambulatory Visit | Attending: Oncology | Admitting: Oncology

## 2013-01-22 DIAGNOSIS — C8583 Other specified types of non-Hodgkin lymphoma, intra-abdominal lymph nodes: Secondary | ICD-10-CM

## 2013-01-22 DIAGNOSIS — C8589 Other specified types of non-Hodgkin lymphoma, extranodal and solid organ sites: Secondary | ICD-10-CM | POA: Diagnosis not present

## 2013-01-22 DIAGNOSIS — R599 Enlarged lymph nodes, unspecified: Secondary | ICD-10-CM | POA: Diagnosis not present

## 2013-01-22 DIAGNOSIS — M47817 Spondylosis without myelopathy or radiculopathy, lumbosacral region: Secondary | ICD-10-CM | POA: Diagnosis not present

## 2013-01-22 DIAGNOSIS — D729 Disorder of white blood cells, unspecified: Secondary | ICD-10-CM

## 2013-01-22 LAB — SPEP & IFE WITH QIG
Gamma Globulin: 51.5 % — ABNORMAL HIGH (ref 11.1–18.8)
IgG (Immunoglobin G), Serum: 1180 mg/dL (ref 650–1600)
IgM, Serum: 7980 mg/dL — ABNORMAL HIGH (ref 41–251)
M-Spike, %: 6.51 g/dL
Total Protein, Serum Electrophoresis: 14.3 g/dL — ABNORMAL HIGH (ref 6.0–8.3)

## 2013-01-22 LAB — KAPPA/LAMBDA LIGHT CHAINS
Kappa free light chain: 177 mg/dL — ABNORMAL HIGH (ref 0.33–1.94)
Lambda Free Lght Chn: 1.33 mg/dL (ref 0.57–2.63)

## 2013-01-22 MED ORDER — IOHEXOL 300 MG/ML  SOLN
100.0000 mL | Freq: Once | INTRAMUSCULAR | Status: AC | PRN
  Administered 2013-01-22: 100 mL via INTRAVENOUS

## 2013-01-25 ENCOUNTER — Telehealth: Payer: Self-pay | Admitting: Oncology

## 2013-01-31 ENCOUNTER — Ambulatory Visit (HOSPITAL_BASED_OUTPATIENT_CLINIC_OR_DEPARTMENT_OTHER): Payer: Medicare Other | Admitting: Lab

## 2013-01-31 ENCOUNTER — Other Ambulatory Visit: Payer: Self-pay | Admitting: Oncology

## 2013-01-31 ENCOUNTER — Other Ambulatory Visit: Payer: Self-pay | Admitting: *Deleted

## 2013-01-31 ENCOUNTER — Telehealth: Payer: Self-pay | Admitting: Oncology

## 2013-01-31 ENCOUNTER — Encounter (HOSPITAL_COMMUNITY): Payer: Self-pay | Admitting: Pharmacy Technician

## 2013-01-31 DIAGNOSIS — D539 Nutritional anemia, unspecified: Secondary | ICD-10-CM | POA: Insufficient documentation

## 2013-01-31 DIAGNOSIS — E11349 Type 2 diabetes mellitus with severe nonproliferative diabetic retinopathy without macular edema: Secondary | ICD-10-CM | POA: Diagnosis not present

## 2013-01-31 DIAGNOSIS — D649 Anemia, unspecified: Secondary | ICD-10-CM

## 2013-01-31 DIAGNOSIS — H3581 Retinal edema: Secondary | ICD-10-CM | POA: Diagnosis not present

## 2013-01-31 DIAGNOSIS — E1139 Type 2 diabetes mellitus with other diabetic ophthalmic complication: Secondary | ICD-10-CM | POA: Diagnosis not present

## 2013-01-31 DIAGNOSIS — H348192 Central retinal vein occlusion, unspecified eye, stable: Secondary | ICD-10-CM | POA: Diagnosis not present

## 2013-01-31 LAB — CBC WITH DIFFERENTIAL/PLATELET
Basophils Absolute: 0 10*3/uL (ref 0.0–0.1)
EOS%: 2 % (ref 0.0–7.0)
Eosinophils Absolute: 0.1 10*3/uL (ref 0.0–0.5)
HGB: 7.9 g/dL — ABNORMAL LOW (ref 13.0–17.1)
MCH: 30.4 pg (ref 27.2–33.4)
NEUT#: 2 10*3/uL (ref 1.5–6.5)
RDW: 19 % — ABNORMAL HIGH (ref 11.0–14.6)
WBC: 3.9 10*3/uL — ABNORMAL LOW (ref 4.0–10.3)
lymph#: 1.4 10*3/uL (ref 0.9–3.3)

## 2013-01-31 NOTE — Telephone Encounter (Signed)
S/w pt today re coming in for lb. Pt will be here by 3pm.

## 2013-02-01 ENCOUNTER — Ambulatory Visit: Payer: Medicare Other | Admitting: Oncology

## 2013-02-02 ENCOUNTER — Other Ambulatory Visit (HOSPITAL_COMMUNITY): Payer: Self-pay | Admitting: Oncology

## 2013-02-02 ENCOUNTER — Ambulatory Visit (HOSPITAL_BASED_OUTPATIENT_CLINIC_OR_DEPARTMENT_OTHER): Payer: Medicare Other

## 2013-02-02 VITALS — BP 124/78 | HR 73 | Temp 97.8°F | Resp 20

## 2013-02-02 DIAGNOSIS — D649 Anemia, unspecified: Secondary | ICD-10-CM

## 2013-02-02 MED ORDER — DIPHENHYDRAMINE HCL 25 MG PO CAPS: 25.0000 mg | ORAL_CAPSULE | Freq: Once | ORAL | Status: DC

## 2013-02-02 MED ORDER — SODIUM CHLORIDE 0.9 % IV SOLN
INTRAVENOUS | Status: DC
  Administered 2013-02-02: 08:00:00 via INTRAVENOUS

## 2013-02-02 MED ORDER — ACETAMINOPHEN 325 MG PO TABS: 650.0000 mg | ORAL_TABLET | Freq: Once | ORAL | Status: DC

## 2013-02-02 MED ORDER — SODIUM CHLORIDE 0.9 % IJ SOLN
10.0000 mL | INTRAMUSCULAR | Status: DC | PRN
  Filled 2013-02-02: qty 10

## 2013-02-02 NOTE — Patient Instructions (Addendum)
Blood Transfusion  A blood transfusion replaces your blood or some of its parts. Blood is replaced when you have lost blood because of surgery, an accident, or for severe blood conditions like anemia. You can donate blood to be used on yourself if you have a planned surgery. If you lose blood during that surgery, your own blood can be given back to you. Any blood given to you is checked to make sure it matches your blood type. Your temperature, blood pressure, and heart rate (vital signs) will be checked often.  GET HELP RIGHT AWAY IF:   You feel sick to your stomach (nauseous) or throw up (vomit).  You have watery poop (diarrhea).  You have shortness of breath or trouble breathing.  You have blood in your pee (urine) or have dark colored pee.  You have chest pain or tightness.  Your eyes or skin turn yellow (jaundice).  You have a temperature by mouth above 102 F (38.9 C), not controlled by medicine.  You start to shake and have chills.  You develop a a red rash (hives) or feel itchy.  You develop lightheadedness or feel confused.  You develop back, joint, or muscle pain.  You do not feel hungry (lost appetite).  You feel tired, restless, or nervous.  You develop belly (abdominal) cramps. Document Released: 11/18/2008 Document Revised: 11/14/2011 Document Reviewed: 11/18/2008 ExitCare Patient Information 2014 [employer].  

## 2013-02-03 LAB — TYPE AND SCREEN
ABO/RH(D): O POS
Antibody Screen: POSITIVE
Unit division: 0

## 2013-02-04 ENCOUNTER — Other Ambulatory Visit: Payer: Self-pay | Admitting: Radiology

## 2013-02-07 ENCOUNTER — Encounter (HOSPITAL_COMMUNITY): Payer: Self-pay

## 2013-02-07 ENCOUNTER — Ambulatory Visit (HOSPITAL_COMMUNITY)
Admission: RE | Admit: 2013-02-07 | Discharge: 2013-02-07 | Disposition: A | Discharge status: Home / Self Care | Payer: Medicare Other | Source: Ambulatory Visit | Attending: Oncology | Admitting: Oncology

## 2013-02-07 DIAGNOSIS — C8583 Other specified types of non-Hodgkin lymphoma, intra-abdominal lymph nodes: Secondary | ICD-10-CM

## 2013-02-07 DIAGNOSIS — C8589 Other specified types of non-Hodgkin lymphoma, extranodal and solid organ sites: Secondary | ICD-10-CM | POA: Diagnosis not present

## 2013-02-07 DIAGNOSIS — I10 Essential (primary) hypertension: Secondary | ICD-10-CM | POA: Insufficient documentation

## 2013-02-07 DIAGNOSIS — Z794 Long term (current) use of insulin: Secondary | ICD-10-CM | POA: Diagnosis not present

## 2013-02-07 DIAGNOSIS — D61818 Other pancytopenia: Secondary | ICD-10-CM | POA: Diagnosis not present

## 2013-02-07 DIAGNOSIS — E785 Hyperlipidemia, unspecified: Secondary | ICD-10-CM | POA: Insufficient documentation

## 2013-02-07 DIAGNOSIS — D472 Monoclonal gammopathy: Secondary | ICD-10-CM | POA: Insufficient documentation

## 2013-02-07 DIAGNOSIS — D729 Disorder of white blood cells, unspecified: Secondary | ICD-10-CM

## 2013-02-07 DIAGNOSIS — Z6839 Body mass index (BMI) 39.0-39.9, adult: Secondary | ICD-10-CM | POA: Diagnosis not present

## 2013-02-07 DIAGNOSIS — D649 Anemia, unspecified: Secondary | ICD-10-CM | POA: Diagnosis not present

## 2013-02-07 DIAGNOSIS — E669 Obesity, unspecified: Secondary | ICD-10-CM | POA: Diagnosis not present

## 2013-02-07 DIAGNOSIS — E119 Type 2 diabetes mellitus without complications: Secondary | ICD-10-CM | POA: Diagnosis not present

## 2013-02-07 LAB — BONE MARROW EXAM: Bone Marrow Exam: 369

## 2013-02-07 LAB — GLUCOSE, CAPILLARY
Glucose-Capillary: 83 mg/dL (ref 70–99)
Glucose-Capillary: 109 mg/dL — ABNORMAL HIGH (ref 70–99)

## 2013-02-07 LAB — CBC
MCH: 30.5 pg (ref 26.0–34.0)
MCV: 91.3 fL (ref 78.0–100.0)
Platelets: 127 10*3/uL — ABNORMAL LOW (ref 150–400)
RDW: 18.8 % — ABNORMAL HIGH (ref 11.5–15.5)
WBC: 3.8 10*3/uL — ABNORMAL LOW (ref 4.0–10.5)

## 2013-02-07 MED ORDER — SODIUM CHLORIDE 0.9 % IV SOLN
INTRAVENOUS | Status: DC
  Administered 2013-02-07: 07:00:00 via INTRAVENOUS

## 2013-02-07 MED ORDER — MIDAZOLAM HCL 2 MG/2ML IJ SOLN
INTRAMUSCULAR | Status: AC | PRN
  Administered 2013-02-07 (×2): 2 mg via INTRAVENOUS

## 2013-02-07 MED ORDER — MIDAZOLAM HCL 2 MG/2ML IJ SOLN
INTRAMUSCULAR | Status: AC
  Filled 2013-02-07: qty 4

## 2013-02-07 MED ORDER — FENTANYL CITRATE 0.05 MG/ML IJ SOLN
INTRAMUSCULAR | Status: AC
  Filled 2013-02-07: qty 4

## 2013-02-07 MED ORDER — FENTANYL CITRATE 0.05 MG/ML IJ SOLN
INTRAMUSCULAR | Status: AC | PRN
  Administered 2013-02-07 (×2): 100 ug via INTRAVENOUS

## 2013-02-07 NOTE — H&P (Signed)
Chief Complaint: "I'm here for a bone marrow biopsy" Referring Physician:Shadad HPI: Edward Clarke is an 46 y.o. male with IgM monoclonal paraproteinemia and is referred to IR for bone marrow biopsy PMHx and meds reviewed.  Past Medical History:  Past Medical History  Diagnosis Date  . Monoclonal paraproteinemia   . Diabetes mellitus   . Hypertension   . Obesity   . Hyperlipidemia     Past Surgical History:  Past Surgical History  Procedure Laterality Date  . Appendectomy    . Eye surgery    . Tonsillectomy      Family History: History reviewed. No pertinent family history.  Social History:  reports that he has quit smoking. His smoking use included Cigarettes. He smoked 0.00 packs per day. He does not have any smokeless tobacco history on file. He reports that he drinks about 1.2 ounces of alcohol per week. He reports that he uses illicit drugs (Marijuana) about 7 times per week.  Allergies:  Allergies  Allergen Reactions  . Benazepril Other (See Comments)    Reaction unknown - may have been headaches    Medications: Insulin Glargine (LANTUS SOLOSTAR) 100 UNIT/ML SOPN (Taking) Sig - Route: Inject 32 Units into the skin every morning. - Subcutaneous Class: Historical Med Number of times this order has been changed since signing: 3 [address] lisinopril (PRINIVIL,ZESTRIL) 10 MG tablet (Taking) Sig - Route: Take 10 mg by mouth every morning. - Oral Class: Historical Med Number of times this order has been changed since signing: 3 Order [address] simvastatin (ZOCOR) 40 MG tablet (Taking) Sig - Route: Take 40 mg by mouth every morning. - Oral Class: Historical Med   Please HPI for pertinent positives, otherwise complete 10 system ROS negative.  Physical Exam: BP 137/88  Pulse 75  Temp(Src) 97.2 F (36.2 C) (Oral)  Resp 18  Ht 6\' 1"  (1.854 m)  Wt 297 lb (134.718 kg)  BMI 39.19 kg/m2  SpO2 95% Body mass index is 39.19 kg/(m^2).   General Appearance:  Alert,  cooperative, no distress, appears stated age  Head:  Normocephalic, without obvious abnormality, atraumatic  ENT: Unremarkable  Neck: Supple, symmetrical, trachea midline  Lungs:   Clear to auscultation bilaterally, no w/r/r, respirations unlabored without use of accessory muscles.  Chest Wall:  No tenderness or deformity  Heart:  Regular rate and rhythm, S1, S2 normal, no murmur, rub or gallop.  Abdomen:   Soft, non-tender, non distended.  Extremities: Extremities normal, atraumatic, no cyanosis or edema  Pulses: 2+ and symmetric  Neurologic: Normal affect, no gross deficits.   Results for orders placed during the hospital encounter of 02/07/13 (from the past 48 hour(s))  APTT     Status: Abnormal   Collection Time    02/07/13  7:25 AM      Result Value Range   aPTT 38 (*) 24 - 37 seconds   Comment:            IF BASELINE aPTT IS ELEVATED,     SUGGEST PATIENT RISK ASSESSMENT     BE USED TO DETERMINE APPROPRIATE     ANTICOAGULANT THERAPY.  CBC     Status: Abnormal   Collection Time    02/07/13  7:25 AM      Result Value Range   WBC 3.8 (*) 4.0 - 10.5 K/uL   RBC 2.75 (*) 4.22 - 5.81 MIL/uL   Hemoglobin 8.4 (*) 13.0 - 17.0 g/dL   HCT 69.6 (*) 29.5 - 28.4 %  MCV 91.3  78.0 - 100.0 fL   MCH 30.5  26.0 - 34.0 pg   MCHC 33.5  30.0 - 36.0 g/dL   RDW 13.0 (*) 86.5 - 78.4 %   Platelets 127 (*) 150 - 400 K/uL  PROTIME-INR     Status: None   Collection Time    02/07/13  7:25 AM      Result Value Range   Prothrombin Time 14.2  11.6 - 15.2 seconds   INR 1.11  0.00 - 1.49   No results found.  Assessment/Plan IgM monoclonal paraproteinemia  For CT guided BM biopsy Discussed procedure, risks, complications, use of sedation Labs reviewed. Consent signed in chart  Brayton El PA-C 02/07/2013, 8:45 AM

## 2013-02-07 NOTE — Procedures (Signed)
BM aspirate and core No comp 

## 2013-02-12 ENCOUNTER — Ambulatory Visit: Payer: Medicare Other | Admitting: Oncology

## 2013-02-13 ENCOUNTER — Telehealth: Payer: Self-pay | Admitting: Oncology

## 2013-02-13 ENCOUNTER — Telehealth: Payer: Self-pay | Admitting: *Deleted

## 2013-02-13 ENCOUNTER — Encounter: Payer: Self-pay | Admitting: *Deleted

## 2013-02-13 ENCOUNTER — Ambulatory Visit (HOSPITAL_BASED_OUTPATIENT_CLINIC_OR_DEPARTMENT_OTHER): Payer: Medicare Other | Admitting: Oncology

## 2013-02-13 ENCOUNTER — Other Ambulatory Visit: Payer: Medicare Other

## 2013-02-13 VITALS — BP 135/84 | HR 75 | Temp 97.0°F | Resp 18 | Ht 73.0 in | Wt 306.4 lb

## 2013-02-13 DIAGNOSIS — R0609 Other forms of dyspnea: Secondary | ICD-10-CM

## 2013-02-13 DIAGNOSIS — R5381 Other malaise: Secondary | ICD-10-CM

## 2013-02-13 DIAGNOSIS — D472 Monoclonal gammopathy: Secondary | ICD-10-CM | POA: Diagnosis not present

## 2013-02-13 DIAGNOSIS — R5383 Other fatigue: Secondary | ICD-10-CM

## 2013-02-13 DIAGNOSIS — C8299 Follicular lymphoma, unspecified, extranodal and solid organ sites: Secondary | ICD-10-CM

## 2013-02-13 DIAGNOSIS — R0989 Other specified symptoms and signs involving the circulatory and respiratory systems: Secondary | ICD-10-CM | POA: Diagnosis not present

## 2013-02-13 DIAGNOSIS — D649 Anemia, unspecified: Secondary | ICD-10-CM

## 2013-02-13 MED ORDER — PROCHLORPERAZINE MALEATE 10 MG PO TABS: 10.0000 mg | ORAL_TABLET | Freq: Four times a day (QID) | ORAL | Status: DC | PRN

## 2013-02-13 NOTE — Telephone Encounter (Signed)
Per staff message and POF I have scheduled appts. Due to the patient having another MD visit on 7/10, unable to schedule treatment. I have sent the scheduler a message to advise a new appt.  JMW

## 2013-02-13 NOTE — Telephone Encounter (Signed)
gv and printed appt sched and avs for pt...emailed MW to add tx... °

## 2013-02-13 NOTE — Progress Notes (Signed)
Hematology and Oncology Follow Up Visit  Edward Clarke [id number] 04/08/1966 47 y.o. 02/13/2013 9:48 AM No PCP Per PatientNo ref. 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  He had presented with IgM about 3 g/dL and M-spike about 2 g/dL.  His workup that includes imaging studies, CT scan of the chest, abdomen and pelvis and skeletal survey had been really unrevealing.  Hepatitis workup has also been unrevealing as well as his HIV. He was was supposed to have a bone marrow biopsy but failed to show up in the last 3 years.   Current therapy: He is about to start chemotherapy with Bendamustine and Rituxan.   Interim History:  Edward Clarke presents today for a follow-up visit.  He is a very nice man I saw back in 2011 and have not been back due to lost insurance.I saw him back in 5 of 2014 and underwent repeat work up including CT scans and bone marrow biopsy. He has been bettersince I saw him last. He is reporting weakness, fatigue and DOE.  He has not reported any major appetite changes.  He has not reported any major chest pain or difficulty breathing.  He has not reported any alternation in his mental status.  He did not report any neurological symptoms.  Overall, performance status and activity level remained reasonable. His image is getting better as well.    Medications: I have reviewed the patient's current medications.  Current Outpatient Prescriptions  Medication Sig Dispense Refill  . aspirin EC 81 MG tablet Take 81 mg by mouth every morning.      . Glycerin-Hypromellose-PEG 400 ([facility] DRY EYE RELIEF) 0.2-0.2-1 % SOLN Place 1-2 drops into both eyes daily as needed (For dry eyes.).      [name] ibuprofen (ADVIL,MOTRIN) 200 MG tablet Take 400-800 mg by mouth every 8 (eight) hours as needed for headache.      . insulin aspart (NOVOLOG FLEXPEN) 100 unit/mL SOLN FlexPen Inject  5-20 Units into the skin See admin instructions. He uses a sliding scale.      . Insulin Glargine (LANTUS SOLOSTAR) 100 UNIT/ML SOPN Inject 32 Units into the skin every morning.       [name] lisinopril (PRINIVIL,ZESTRIL) 10 MG tablet Take 10 mg by mouth every morning.       . metFORMIN (GLUCOPHAGE) 1000 MG tablet Take 1,000 mg by mouth 2 (two) times daily.       . Multiple Vitamin (MULTIVITAMIN WITH MINERALS) TABS Take 1 tablet by mouth every morning.      . prochlorperazine (COMPAZINE) 10 MG tablet Take 1 tablet (10 mg total) by mouth every 6 (six) hours as needed.  30 tablet  0  . simvastatin (ZOCOR) 40 MG tablet Take 40 mg by mouth every morning.       . tobramycin (TOBREX) 0.3 % ophthalmic solution Place 1 drop into both eyes See admin instructions. He uses for 2 days prior to eye procedures.       No current facility-administered medications for this visit.     Allergies:  Allergies  Allergen Reactions  . Benazepril Other (See Comments)    Reaction unknown - may have been headaches    Past Medical History, Surgical history, Social history, and Family History were reviewed and updated.  Review of Systems: Constitutional:  Negative for fever, chills, night sweats, anorexia, weight loss, pain. Cardiovascular: no chest pain or dyspnea on  exertion Respiratory: negative Neurological: negative Dermatological: negative ENT: negative Skin: Negative. Gastrointestinal: negative Genito-Urinary: negative Hematological and Lymphatic: negative Breast: negative Musculoskeletal: negative Remaining ROS negative. Physical Exam: Blood pressure 135/84, pulse 75, temperature 97 F (36.1 C), temperature source Oral, resp. rate 18, height 6\' 1"  (1.854 m), weight 306 lb 6.4 oz (138.982 kg). ECOG: 1 General appearance: alert Head: Normocephalic, without obvious abnormality, atraumatic Neck: no adenopathy, no carotid bruit, no JVD, supple, symmetrical, trachea midline and thyroid not enlarged,  symmetric, no tenderness/mass/nodules Lymph nodes: Cervical, supraclavicular, and axillary nodes normal. Heart:regular rate and rhythm, S1, S2 normal, no murmur, click, rub or gallop Lung:chest clear, no wheezing, rales, normal symmetric air entry Abdomin: soft, non-tender, without masses or organomegaly EXT:no erythema, induration, or nodules   Lab Results: Lab Results  Component Value Date   WBC 3.8* 02/07/2013   HGB 8.4* 02/07/2013   HCT 25.1* 02/07/2013   MCV 91.3 02/07/2013   PLT 127* 02/07/2013     Chemistry      Component Value Date/Time   NA 131* 01/18/2013 0916   NA 134* 11/24/2010 1320   K 5.2* 01/18/2013 0916   K 4.7 11/24/2010 1320   CL 99 01/18/2013 0916   CL 100 11/24/2010 1320   CO2 22 01/18/2013 0916   CO2 24 11/24/2010 1320   BUN 16.7 01/18/2013 0916   BUN 18 11/24/2010 1320   CREATININE 1.0 01/18/2013 0916   CREATININE 1.15 11/24/2010 1320      Component Value Date/Time   CALCIUM 11.4* 01/18/2013 0916   CALCIUM 10.1 11/24/2010 1320   ALKPHOS 46 01/18/2013 0916   ALKPHOS 51 11/24/2010 1320   AST 30 01/18/2013 0916   AST 18 11/24/2010 1320   ALT 18 01/18/2013 0916   ALT 17 11/24/2010 1320   BILITOT 0.30 01/18/2013 0916   BILITOT 0.4 11/24/2010 1320      Bone marrow biopsy: The results was discussed with the reviewing pathologists and confirmed the presence of possible plasma cell disorder with infiltration of the bone marrow close to 40-50%. 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  CT scan chest abdomen and pelvis done on 01/22/2013 was also discussed and showed diffuse adenopathy very minimally changed in the last 4 years. No evidence of any splenomegaly or any bulky adenopathy.  Impression and Plan:  This is a pleasant 47 year old gentleman with the following issues: 1. IgM monoclonal paraproteinemia. This is likely due to  lymphoproliferative disorder such as  Waldenstrm's or lymphoplasmacytic lymphoma.  I do not think he has multiple myeloma.  The results of the CT scan and the bone marrow biopsy were discussed today in detail with the patient and his wife. At this time, I feel this is most likely a lymphoma rather than a multiple myeloma-type picture. Options of treatment were discussed today I explained to him and his wife that the systemic chemotherapy in combination with rituximab is probably the best approach. I discussed with him today in details the combination of rituximab with bendamustine. Risks and benefits and complications were discussed today. Complications that includes nausea, vomiting, myelosuppression, neutropenia neutropenic sepsis and possible need for growth factor support and hospitalization was discussed. Also infusion today toxicity with rituximab were discussed as well as opportunistic infections. He is willing to proceed he understands that we need to do this is known as possible. We'll set him up with chemotherapy education class I will also given him  prescription for Compazine.  2. Anemia: likely related to #1. We will try to avoid transfusions at this time.  3. Hyperviscosity syndrome: I discussed as a potential dangers complication from an IgM paraproteinemia I also discussed with him the possible need for a plasma exchange in case he develops a hyperviscosity syndrome. At this time I see no evidence of that he has no neurological symptoms or cardiovascular symptoms to suggest that. However I have educated him about it I talked to him about the possible need for a plasma exchange should that happens. I might hope is with the start of chemotherapy this paraproteinemia will decline and that becomes a less of an issue.   All his questions were answered today and we will start chemotherapy as soon as possible and we will use preferably incidentally consider Port-A-Cath placement down the line.   [facility], MD 6/11/20149:48 AM

## 2013-02-14 ENCOUNTER — Other Ambulatory Visit (HOSPITAL_BASED_OUTPATIENT_CLINIC_OR_DEPARTMENT_OTHER): Payer: Medicare Other | Admitting: Lab

## 2013-02-14 ENCOUNTER — Ambulatory Visit (HOSPITAL_BASED_OUTPATIENT_CLINIC_OR_DEPARTMENT_OTHER): Payer: Medicare Other

## 2013-02-14 VITALS — BP 132/74 | HR 91 | Temp 98.2°F | Resp 18

## 2013-02-14 DIAGNOSIS — D472 Monoclonal gammopathy: Secondary | ICD-10-CM

## 2013-02-14 DIAGNOSIS — C8299 Follicular lymphoma, unspecified, extranodal and solid organ sites: Secondary | ICD-10-CM | POA: Diagnosis not present

## 2013-02-14 DIAGNOSIS — Z5111 Encounter for antineoplastic chemotherapy: Secondary | ICD-10-CM | POA: Diagnosis not present

## 2013-02-14 DIAGNOSIS — Z5112 Encounter for antineoplastic immunotherapy: Secondary | ICD-10-CM

## 2013-02-14 LAB — CBC WITH DIFFERENTIAL/PLATELET
BASO%: 0.3 % (ref 0.0–2.0)
LYMPH%: 35.2 % (ref 14.0–49.0)
MCHC: 31.6 g/dL — ABNORMAL LOW (ref 32.0–36.0)
MCV: 92.8 fL (ref 79.3–98.0)
MONO#: 0.4 10*3/uL (ref 0.1–0.9)
MONO%: 10.9 % (ref 0.0–14.0)
Platelets: 114 10*3/uL — ABNORMAL LOW (ref 140–400)
RBC: 3.04 10*6/uL — ABNORMAL LOW (ref 4.20–5.82)
WBC: 3.8 10*3/uL — ABNORMAL LOW (ref 4.0–10.3)
nRBC: 0 % (ref 0–0)

## 2013-02-14 LAB — COMPREHENSIVE METABOLIC PANEL (CC13)
ALT: 13 U/L (ref 0–55)
AST: 26 U/L (ref 5–34)
Creatinine: 1 mg/dL (ref 0.7–1.3)
Total Bilirubin: 0.42 mg/dL (ref 0.20–1.20)

## 2013-02-14 MED ORDER — DEXAMETHASONE SODIUM PHOSPHATE 10 MG/ML IJ SOLN
10.0000 mg | Freq: Once | INTRAMUSCULAR | Status: AC
  Administered 2013-02-14: 10 mg via INTRAVENOUS

## 2013-02-14 MED ORDER — SODIUM CHLORIDE 0.9 % IV SOLN
375.0000 mg/m2 | Freq: Once | INTRAVENOUS | Status: AC
  Administered 2013-02-14: 1000 mg via INTRAVENOUS
  Filled 2013-02-14: qty 100

## 2013-02-14 MED ORDER — DIPHENHYDRAMINE HCL 25 MG PO CAPS
50.0000 mg | ORAL_CAPSULE | Freq: Once | ORAL | Status: AC
  Administered 2013-02-14: 50 mg via ORAL

## 2013-02-14 MED ORDER — SODIUM CHLORIDE 0.9 % IV SOLN
90.0000 mg/m2 | Freq: Once | INTRAVENOUS | Status: AC
  Administered 2013-02-14: 240 mg via INTRAVENOUS
  Filled 2013-02-14: qty 48

## 2013-02-14 MED ORDER — SODIUM CHLORIDE 0.9 % IV SOLN
Freq: Once | INTRAVENOUS | Status: AC
  Administered 2013-02-14: 10:00:00 via INTRAVENOUS

## 2013-02-14 MED ORDER — ONDANSETRON 8 MG/50ML IVPB (CHCC)
8.0000 mg | Freq: Once | INTRAVENOUS | Status: AC
  Administered 2013-02-14: 8 mg via INTRAVENOUS

## 2013-02-14 MED ORDER — ACETAMINOPHEN 325 MG PO TABS
650.0000 mg | ORAL_TABLET | Freq: Once | ORAL | Status: AC
  Administered 2013-02-14: 650 mg via ORAL

## 2013-02-14 NOTE — Patient Instructions (Signed)
[facility] Discharge Instructions for Patients Receiving Chemotherapy  Today you received the following chemotherapy agents Rituxan/Treanda To help prevent nausea and vomiting after your treatment, we encourage you to take your nausea medication as prescribed.  If you develop nausea and vomiting that is not controlled by your nausea medication, call the clinic.   BELOW ARE SYMPTOMS THAT SHOULD BE REPORTED IMMEDIATELY:  *FEVER GREATER THAN 100.5 F  *CHILLS WITH OR WITHOUT FEVER  NAUSEA AND VOMITING THAT IS NOT CONTROLLED WITH YOUR NAUSEA MEDICATION  *UNUSUAL SHORTNESS OF BREATH  *UNUSUAL BRUISING OR BLEEDING  TENDERNESS IN MOUTH AND THROAT WITH OR WITHOUT PRESENCE OF ULCERS  *URINARY PROBLEMS  *BOWEL PROBLEMS  UNUSUAL RASH Items with * indicate a potential emergency and should be followed up as soon as possible. Feel free to call the clinic you have any questions or concerns. The clinic phone number is [phone].    Rituximab injection What is this medicine? RITUXIMAB (ri TUX i mab) is a monoclonal antibody. This medicine changes the way the body's immune system works. It is used commonly to treat non-Hodgkin's lymphoma and other conditions. In cancer cells, this drug targets a specific protein within cancer cells and stops the cancer cells from growing. It is also used to treat rhuematoid arthritis (RA). In RA, this medicine slow the inflammatory process and help reduce joint pain and swelling. This medicine is often used with other cancer or arthritis medications. This medicine may be used for other purposes; ask your health care provider or pharmacist if you have questions. What should I tell my health care provider before I take this medicine? They need to know if you have any of these conditions: -blood disorders -heart disease -history of hepatitis B -infection (especially a virus infection such as chickenpox, cold sores, or herpes) -irregular  heartbeat -kidney disease -lung or breathing disease, like asthma -lupus -an unusual or allergic reaction to rituximab, mouse proteins, other medicines, foods, dyes, or preservatives -pregnant or trying to get pregnant -breast-feeding How should I use this medicine? This medicine is for infusion into a vein. It is administered in a hospital or clinic by a specially trained health care professional. A special MedGuide will be given to you by the pharmacist with each prescription and refill. Be sure to read this information carefully each time. Talk to your pediatrician regarding the use of this medicine in children. This medicine is not approved for use in children. Overdosage: If you think you have taken too much of this medicine contact a poison control center or emergency room at once. NOTE: This medicine is only for you. Do not share this medicine with others. What if I miss a dose? It is important not to miss a dose. Call your doctor or health care professional if you are unable to keep an appointment. What may interact with this medicine? -cisplatin -medicines for blood pressure -some other medicines for arthritis -vaccines This list may not describe all possible interactions. Give your health care provider a list of all the medicines, herbs, non-prescription drugs, or dietary supplements you use. Also tell them if you smoke, drink alcohol, or use illegal drugs. Some items may interact with your medicine. What should I watch for while using this medicine? Report any side effects that you notice during your treatment right away, such as changes in your breathing, fever, chills, dizziness or lightheadedness. These effects are more common with the first dose. Visit your prescriber or health care professional for checks on  your progress. You will need to have regular blood work. Report any other side effects. The side effects of this medicine can continue after you finish your treatment.  Continue your course of treatment even though you feel ill unless your doctor tells you to stop. Call your doctor or health care professional for advice if you get a fever, chills or sore throat, or other symptoms of a cold or flu. Do not treat yourself. This drug decreases your body's ability to fight infections. Try to avoid being around people who are sick. This medicine may increase your risk to bruise or bleed. Call your doctor or health care professional if you notice any unusual bleeding. Be careful brushing and flossing your teeth or using a toothpick because you may get an infection or bleed more easily. If you have any dental work done, tell your dentist you are receiving this medicine. Avoid taking products that contain aspirin, acetaminophen, ibuprofen, naproxen, or ketoprofen unless instructed by your doctor. These medicines may hide a fever. Do not become pregnant while taking this medicine. Women should inform their doctor if they wish to become pregnant or think they might be pregnant. There is a potential for serious side effects to an unborn child. Talk to your health care professional or pharmacist for more information. Do not breast-feed an infant while taking this medicine. What side effects may I notice from receiving this medicine? Side effects that you should report to your doctor or health care professional as soon as possible: -allergic reactions like skin rash, itching or hives, swelling of the face, lips, or tongue -low blood counts - this medicine may decrease the number of white blood cells, red blood cells and platelets. You may be at increased risk for infections and bleeding. -signs of infection - fever or chills, cough, sore throat, pain or difficulty passing urine -signs of decreased platelets or bleeding - bruising, pinpoint red spots on the skin, black, tarry stools, blood in the urine -signs of decreased red blood cells - unusually weak or tired, fainting spells,  lightheadedness -breathing problems -confused, not responsive -chest pain -fast, irregular heartbeat -feeling faint or lightheaded, falls -mouth sores -redness, blistering, peeling or loosening of the skin, including inside the mouth -stomach pain -swelling of the ankles, feet, or hands -trouble passing urine or change in the amount of urine Side effects that usually do not require medical attention (report to your doctor or other health care professional if they continue or are bothersome): -anxiety -headache -loss of appetite -muscle aches -nausea -night sweats This list may not describe all possible side effects. Call your doctor for medical advice about side effects. You may report side effects to FDA at 1-800-FDA-1088. Where should I keep my medicine? This drug is given in a hospital or clinic and will not be stored at home. NOTE: This sheet is a summary. It may not cover all possible information. If you have questions about this medicine, talk to your doctor, pharmacist, or health care provider.  2013, Elsevier/Gold Standard. (04/21/2008 2:04:59 PM)      Bendamustine Injection What is this medicine? BENDAMUSTINE (BEN da MUS teen) is a chemotherapy drug. It is used to treat chronic lymphocytic leukemia and non-Hodgkin lymphoma. This medicine may be used for other purposes; ask your health care provider or pharmacist if you have questions. What should I tell my health care provider before I take this medicine? They need to know if you have any of these conditions: -kidney disease -liver disease -an  unusual or allergic reaction to bendamustine, mannitol, other medicines, foods, dyes, or preservatives -pregnant or trying to get pregnant -breast-feeding How should I use this medicine? This medicine is for infusion into a vein. It is given by a health care professional in a hospital or clinic setting. Talk to your pediatrician regarding the use of this medicine in children.  Special care may be needed. Overdosage: If you think you have taken too much of this medicine contact a poison control center or emergency room at once. NOTE: This medicine is only for you. Do not share this medicine with others. What if I miss a dose? It is important not to miss your dose. Call your doctor or health care professional if you are unable to keep an appointment. What may interact with this medicine? Do not take this medicine with any of the following medications: -clozapine This medicine may also interact with the following medications: -atazanavir -cimetidine -ciprofloxacin -enoxacin -fluvoxamine -medicines for seizures like carbamazepine and phenobarbital -mexiletine -rifampin -tacrine -thiabendazole -zileuton This list may not describe all possible interactions. Give your health care provider a list of all the medicines, herbs, non-prescription drugs, or dietary supplements you use. Also tell them if you smoke, drink alcohol, or use illegal drugs. Some items may interact with your medicine. What should I watch for while using this medicine? Your condition will be monitored carefully while you are receiving this medicine. This drug may make you feel generally unwell. This is not uncommon, as chemotherapy can affect healthy cells as well as cancer cells. Report any side effects. Continue your course of treatment even though you feel ill unless your doctor tells you to stop. Call your doctor or health care professional for advice if you get a fever, chills or sore throat, or other symptoms of a cold or flu. Do not treat yourself. This drug decreases your body's ability to fight infections. Try to avoid being around people who are sick. This medicine may increase your risk to bruise or bleed. Call your doctor or health care professional if you notice any unusual bleeding. Be careful brushing and flossing your teeth or using a toothpick because you may get an infection or bleed  more easily. If you have any dental work done, tell your dentist you are receiving this medicine. Avoid taking products that contain aspirin, acetaminophen, ibuprofen, naproxen, or ketoprofen unless instructed by your doctor. These medicines may hide a fever. Do not become pregnant while taking this medicine. Women should inform their doctor if they wish to become pregnant or think they might be pregnant. There is a potential for serious side effects to an unborn child. Men should inform their doctors if they wish to father a child. This medicine may lower sperm counts. Talk to your health care professional or pharmacist for more information. Do not breast-feed an infant while taking this medicine. What side effects may I notice from receiving this medicine? Side effects that you should report to your doctor or health care professional as soon as possible: -allergic reactions like skin rash, itching or hives, swelling of the face, lips, or tongue -low blood counts - this medicine may decrease the number of white blood cells, red blood cells and platelets. You may be at increased risk for infections and bleeding. -signs of infection - fever or chills, cough, sore throat, pain or difficulty passing urine -signs of decreased platelets or bleeding - bruising, pinpoint red spots on the skin, black, tarry stools, blood in the urine -signs of  decreased red blood cells - unusually weak or tired, fainting spells, lightheadedness -trouble passing urine or change in the amount of urine Side effects that usually do not require medical attention (report to your doctor or health care professional if they continue or are bothersome): -diarrhea This list may not describe all possible side effects. Call your doctor for medical advice about side effects. You may report side effects to FDA at 1-800-FDA-1088. Where should I keep my medicine? This drug is given in a hospital or clinic and will not be stored at  home. NOTE: This sheet is a summary. It may not cover all possible information. If you have questions about this medicine, talk to your doctor, pharmacist, or health care provider.  2013, Elsevier/Gold Standard. (11/18/2011 2:15:47 PM)

## 2013-02-15 ENCOUNTER — Ambulatory Visit (HOSPITAL_BASED_OUTPATIENT_CLINIC_OR_DEPARTMENT_OTHER): Payer: Medicare Other

## 2013-02-15 VITALS — BP 123/77 | HR 85 | Temp 97.2°F

## 2013-02-15 DIAGNOSIS — Z5111 Encounter for antineoplastic chemotherapy: Secondary | ICD-10-CM | POA: Diagnosis not present

## 2013-02-15 DIAGNOSIS — D472 Monoclonal gammopathy: Secondary | ICD-10-CM | POA: Diagnosis not present

## 2013-02-15 MED ORDER — SODIUM CHLORIDE 0.9 % IV SOLN
90.0000 mg/m2 | Freq: Once | INTRAVENOUS | Status: AC
  Administered 2013-02-15: 240 mg via INTRAVENOUS
  Filled 2013-02-15: qty 48

## 2013-02-15 MED ORDER — ONDANSETRON 8 MG/50ML IVPB (CHCC)
8.0000 mg | Freq: Once | INTRAVENOUS | Status: AC
  Administered 2013-02-15: 8 mg via INTRAVENOUS

## 2013-02-15 MED ORDER — DEXAMETHASONE SODIUM PHOSPHATE 10 MG/ML IJ SOLN
10.0000 mg | Freq: Once | INTRAMUSCULAR | Status: AC
  Administered 2013-02-15: 10 mg via INTRAVENOUS

## 2013-02-15 MED ORDER — SODIUM CHLORIDE 0.9 % IV SOLN
Freq: Once | INTRAVENOUS | Status: AC
  Administered 2013-02-15: 13:00:00 via INTRAVENOUS

## 2013-02-15 NOTE — Patient Instructions (Addendum)
[facility] Discharge Instructions for Patients Receiving Chemotherapy  Today you received the following chemotherapy agents treanda.  To help prevent nausea and vomiting after your treatment, we encourage you to take your nausea medication compazine.   If you develop nausea and vomiting that is not controlled by your nausea medication, call the clinic.   BELOW ARE SYMPTOMS THAT SHOULD BE REPORTED IMMEDIATELY:  *FEVER GREATER THAN 100.5 F  *CHILLS WITH OR WITHOUT FEVER  NAUSEA AND VOMITING THAT IS NOT CONTROLLED WITH YOUR NAUSEA MEDICATION  *UNUSUAL SHORTNESS OF BREATH  *UNUSUAL BRUISING OR BLEEDING  TENDERNESS IN MOUTH AND THROAT WITH OR WITHOUT PRESENCE OF ULCERS  *URINARY PROBLEMS  *BOWEL PROBLEMS  UNUSUAL RASH Items with * indicate a potential emergency and should be followed up as soon as possible.  Feel free to call the clinic you have any questions or concerns. The clinic phone number is [phone].    

## 2013-02-17 ENCOUNTER — Other Ambulatory Visit: Payer: Self-pay | Admitting: Oncology

## 2013-02-18 LAB — SPEP & IFE WITH QIG
Alpha-1-Globulin: 3.8 % (ref 2.9–4.9)
Alpha-2-Globulin: 6.7 % — ABNORMAL LOW (ref 7.1–11.8)
Total Protein, Serum Electrophoresis: 12.9 g/dL — ABNORMAL HIGH (ref 6.0–8.3)

## 2013-03-01 ENCOUNTER — Ambulatory Visit (HOSPITAL_BASED_OUTPATIENT_CLINIC_OR_DEPARTMENT_OTHER): Payer: Medicare Other | Admitting: Oncology

## 2013-03-01 ENCOUNTER — Other Ambulatory Visit (HOSPITAL_BASED_OUTPATIENT_CLINIC_OR_DEPARTMENT_OTHER): Payer: Medicare Other

## 2013-03-01 ENCOUNTER — Encounter: Payer: Self-pay | Admitting: Oncology

## 2013-03-01 VITALS — BP 146/90 | HR 98 | Temp 98.1°F | Resp 20 | Ht 73.0 in | Wt 301.2 lb

## 2013-03-01 DIAGNOSIS — D472 Monoclonal gammopathy: Secondary | ICD-10-CM | POA: Diagnosis not present

## 2013-03-01 DIAGNOSIS — C8299 Follicular lymphoma, unspecified, extranodal and solid organ sites: Secondary | ICD-10-CM | POA: Diagnosis not present

## 2013-03-01 LAB — CBC WITH DIFFERENTIAL/PLATELET
BASO%: 0.4 % (ref 0.0–2.0)
Eosinophils Absolute: 0.1 10*3/uL (ref 0.0–0.5)
LYMPH%: 14.1 % (ref 14.0–49.0)
MONO#: 0.5 10*3/uL (ref 0.1–0.9)
NEUT#: 2.2 10*3/uL (ref 1.5–6.5)
Platelets: 181 10*3/uL (ref 140–400)
RBC: 2.47 10*6/uL — ABNORMAL LOW (ref 4.20–5.82)
WBC: 3.3 10*3/uL — ABNORMAL LOW (ref 4.0–10.3)
lymph#: 0.5 10*3/uL — ABNORMAL LOW (ref 0.9–3.3)

## 2013-03-01 LAB — HOLD TUBE, BLOOD BANK

## 2013-03-01 NOTE — Patient Instructions (Signed)
Constipation:  Begin Colace (docusate sodium) 1 tab twice a day. Add Miralax 1 capful mixed in water daily.

## 2013-03-01 NOTE — Progress Notes (Signed)
Hematology and Oncology Follow Up Visit  Edward Clarke [id number] 1966/04/10 47 y.o. 03/01/2013 1:23 PM No PCP Per PatientNo ref. 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  He had presented with IgM about 3 g/dL and M-spike about 2 g/dL.  His workup that includes imaging studies, CT scan of the chest, abdomen and pelvis and skeletal survey had been really unrevealing.  Hepatitis workup has also been unrevealing as well as his HIV. He was was supposed to have a bone marrow biopsy but failed to show up in the last 3 years.   Current therapy: Started chemotherapy with Bendamustine and Rituxan on 02/14/13.   Interim History:  Edward Clarke presents today for a follow-up visit.  He is a very nice man seen back in 2011 and did not been back due to lost insurance. He cameback in 01/2013 and underwent repeat work up including CT scans and bone marrow biopsy. The patient started his chemotherapy earlier this month. States his energy level is better. He is sleeping less. He reports mild dyspnea on exertion, but no shortness of breath at rest. He experienced nausea with dry heaves her once she is following his chemotherapy. Using his anti-emetics as needed. Reports that he also had difficulty with constipation around the time of his chemotherapy. Constipation is now resolved.  He has not reported any major appetite changes.  Denies chest pain. No rashes.  He has not reported any alternation in his mental status.  He did not report any neurological symptoms.  Overall, performance status and activity level remained reasonable.   Medications: I have reviewed the patient's current medications.  Current Outpatient Prescriptions  Medication Sig Dispense Refill  . aspirin EC 81 MG tablet Take 81 mg by mouth every morning.      . Glycerin-Hypromellose-PEG 400 ([facility] DRY EYE RELIEF) 0.2-0.2-1 % SOLN  Place 1-2 drops into both eyes daily as needed (For dry eyes.).      [name] ibuprofen (ADVIL,MOTRIN) 200 MG tablet Take 400-800 mg by mouth every 8 (eight) hours as needed for headache.      . insulin aspart (NOVOLOG FLEXPEN) 100 unit/mL SOLN FlexPen Inject 5-20 Units into the skin See admin instructions. He uses a sliding scale.      . Insulin Glargine (LANTUS SOLOSTAR) 100 UNIT/ML SOPN Inject 32 Units into the skin every morning.       [name] lisinopril (PRINIVIL,ZESTRIL) 10 MG tablet Take 10 mg by mouth every morning.       . metFORMIN (GLUCOPHAGE) 1000 MG tablet Take 1,000 mg by mouth 2 (two) times daily.       . Multiple Vitamin (MULTIVITAMIN WITH MINERALS) TABS Take 1 tablet by mouth every morning.      . prochlorperazine (COMPAZINE) 10 MG tablet Take 1 tablet (10 mg total) by mouth every 6 (six) hours as needed.  30 tablet  0  . simvastatin (ZOCOR) 40 MG tablet Take 40 mg by mouth every morning.       . tobramycin (TOBREX) 0.3 % ophthalmic solution Place 1 drop into both eyes See admin instructions. He uses for 2 days prior to eye procedures.       No current facility-administered medications for this visit.     Allergies:  Allergies  Allergen Reactions  . Benazepril Other (See Comments)    Reaction unknown - may have been headaches    Past Medical History, Surgical history,  Social history, and Family History were reviewed and updated.  Review of Systems: Constitutional:  Negative for fever, chills, night sweats, anorexia, weight loss, pain. Cardiovascular: no chest pain or dyspnea on exertion Respiratory: negative Neurological: negative Dermatological: negative ENT: negative Skin: Negative. Gastrointestinal: negative Genito-Urinary: negative Hematological and Lymphatic: negative Breast: negative Musculoskeletal: negative Remaining ROS negative.  Physical Exam: Blood pressure 146/90, pulse 98, temperature 98.1 F (36.7 C), temperature source Oral, resp. rate 20, height 6\' 1"   (1.854 m), weight 301 lb 3.2 oz (136.623 kg). ECOG: 1 General appearance: alert Head: Normocephalic, without obvious abnormality, atraumatic Neck: no adenopathy, no carotid bruit, no JVD, supple, symmetrical, trachea midline and thyroid not enlarged, symmetric, no tenderness/mass/nodules Lymph nodes: Cervical, supraclavicular, and axillary nodes normal. Heart:regular rate and rhythm, S1, S2 normal, no murmur, click, rub or gallop Lung:chest clear, no wheezing, rales, normal symmetric air entry Abdomen: soft, non-tender, without masses or organomegaly EXT:no erythema, induration, or nodules   Lab Results: Lab Results  Component Value Date   WBC 3.3* 03/01/2013   HGB 8.1* 03/01/2013   HCT 22.5* 03/01/2013   MCV 90.9 03/01/2013   PLT 181 03/01/2013     Chemistry      Component Value Date/Time   NA 132* 02/14/2013 0758   NA 134* 11/24/2010 1320   K 4.2 02/14/2013 0758   K 4.7 11/24/2010 1320   CL 101 02/14/2013 0758   CL 100 11/24/2010 1320   CO2 21* 02/14/2013 0758   CO2 24 11/24/2010 1320   BUN 16.9 02/14/2013 0758   BUN 18 11/24/2010 1320   CREATININE 1.0 02/14/2013 0758   CREATININE 1.15 11/24/2010 1320      Component Value Date/Time   CALCIUM 11.0* 02/14/2013 0758   CALCIUM 10.1 11/24/2010 1320   ALKPHOS 50 02/14/2013 0758   ALKPHOS 51 11/24/2010 1320   AST 26 02/14/2013 0758   AST 18 11/24/2010 1320   ALT 13 02/14/2013 0758   ALT 17 11/24/2010 1320   BILITOT 0.42 02/14/2013 0758   BILITOT 0.4 11/24/2010 1320      Impression and Plan:  This is a pleasant 47 year old gentleman with the following issues: 1. IgM monoclonal paraproteinemia. This is likely due to  lymphoproliferative disorder such as Waldenstrm's or lymphoplasmacytic lymphoma.  I do not think he has multiple myeloma.  He is status post one cycle of bendamustine and Rituxan. He has grade 1 fatigue and grade 1 nausea. Overall he tolerated the chemotherapy well with an improvement in his performance status. He will return in  about 2 weeks for evaluation prior to cycle #2. 2. Anemia: likely related to #1 and recent chemotherapy. He has no active bleeding. No transfusion is indicated at this point in time.  3. Hyperviscosity syndrome: I discussed as a potential dangers complication from an IgM paraproteinemia I also discussed with him the possible need for a plasma exchange in case he develops a hyperviscosity syndrome. At this time I see no evidence of that he has no neurological symptoms or cardiovascular symptoms to suggest that. However I have educated him about it I talked to him about the possible need for a plasma exchange should that happens. The hope is with the start of chemotherapy this paraproteinemia will decline and that becomes a less of an issue. 4.  constipation: I have advised him on a bowel regimen. 5. Nausea/vomiting prophylaxis: He has Compazine at home. 6. followup: On 03/14/2013 for cycle 2 chemotherapy.    Clenton Pare 6/27/20141:23 PM

## 2013-03-04 DIAGNOSIS — E11349 Type 2 diabetes mellitus with severe nonproliferative diabetic retinopathy without macular edema: Secondary | ICD-10-CM | POA: Diagnosis not present

## 2013-03-04 DIAGNOSIS — H348192 Central retinal vein occlusion, unspecified eye, stable: Secondary | ICD-10-CM | POA: Diagnosis not present

## 2013-03-04 DIAGNOSIS — E1139 Type 2 diabetes mellitus with other diabetic ophthalmic complication: Secondary | ICD-10-CM | POA: Diagnosis not present

## 2013-03-04 DIAGNOSIS — H3581 Retinal edema: Secondary | ICD-10-CM | POA: Diagnosis not present

## 2013-03-04 DIAGNOSIS — E1165 Type 2 diabetes mellitus with hyperglycemia: Secondary | ICD-10-CM | POA: Diagnosis not present

## 2013-03-05 ENCOUNTER — Other Ambulatory Visit: Payer: Self-pay | Admitting: Endocrinology

## 2013-03-05 DIAGNOSIS — IMO0001 Reserved for inherently not codable concepts without codable children: Secondary | ICD-10-CM

## 2013-03-12 ENCOUNTER — Other Ambulatory Visit (HOSPITAL_BASED_OUTPATIENT_CLINIC_OR_DEPARTMENT_OTHER): Payer: Medicare Other

## 2013-03-12 DIAGNOSIS — C8299 Follicular lymphoma, unspecified, extranodal and solid organ sites: Secondary | ICD-10-CM

## 2013-03-12 DIAGNOSIS — IMO0001 Reserved for inherently not codable concepts without codable children: Secondary | ICD-10-CM

## 2013-03-12 LAB — BASIC METABOLIC PANEL
CO2: 28 mEq/L (ref 19–32)
Calcium: 10.3 mg/dL (ref 8.4–10.5)
Creatinine, Ser: 1.3 mg/dL (ref 0.4–1.5)
GFR: 78.44 mL/min (ref >60.00)
Glucose, Bld: 121 mg/dL — ABNORMAL HIGH (ref 70–99)

## 2013-03-14 ENCOUNTER — Ambulatory Visit: Payer: Medicare Other | Admitting: Endocrinology

## 2013-03-14 ENCOUNTER — Other Ambulatory Visit: Payer: Medicare Other

## 2013-03-14 ENCOUNTER — Telehealth: Payer: Self-pay | Admitting: Oncology

## 2013-03-14 ENCOUNTER — Ambulatory Visit (HOSPITAL_BASED_OUTPATIENT_CLINIC_OR_DEPARTMENT_OTHER): Payer: Medicare Other

## 2013-03-14 ENCOUNTER — Ambulatory Visit (HOSPITAL_BASED_OUTPATIENT_CLINIC_OR_DEPARTMENT_OTHER): Payer: Medicare Other | Admitting: Oncology

## 2013-03-14 VITALS — BP 138/82 | HR 81 | Temp 97.1°F | Resp 18 | Ht 73.0 in | Wt 304.0 lb

## 2013-03-14 VITALS — BP 135/90 | HR 67 | Temp 97.6°F | Resp 18

## 2013-03-14 DIAGNOSIS — Z5112 Encounter for antineoplastic immunotherapy: Secondary | ICD-10-CM | POA: Diagnosis not present

## 2013-03-14 DIAGNOSIS — R112 Nausea with vomiting, unspecified: Secondary | ICD-10-CM | POA: Diagnosis not present

## 2013-03-14 DIAGNOSIS — Z5111 Encounter for antineoplastic chemotherapy: Secondary | ICD-10-CM

## 2013-03-14 DIAGNOSIS — D649 Anemia, unspecified: Secondary | ICD-10-CM | POA: Diagnosis not present

## 2013-03-14 DIAGNOSIS — D472 Monoclonal gammopathy: Secondary | ICD-10-CM

## 2013-03-14 DIAGNOSIS — K5900 Constipation, unspecified: Secondary | ICD-10-CM

## 2013-03-14 DIAGNOSIS — C8590 Non-Hodgkin lymphoma, unspecified, unspecified site: Secondary | ICD-10-CM

## 2013-03-14 LAB — CBC WITH DIFFERENTIAL/PLATELET
Basophils Absolute: 0 10*3/uL (ref 0.0–0.1)
Eosinophils Absolute: 0.1 10*3/uL (ref 0.0–0.5)
HCT: 26.9 % — ABNORMAL LOW (ref 38.4–49.9)
HGB: 8.7 g/dL — ABNORMAL LOW (ref 13.0–17.1)
MCH: 30.2 pg (ref 27.2–33.4)
MONO#: 0.3 10*3/uL (ref 0.1–0.9)
NEUT#: 1.9 10*3/uL (ref 1.5–6.5)
NEUT%: 65.5 % (ref 39.0–75.0)
RDW: 18.3 % — ABNORMAL HIGH (ref 11.0–14.6)
WBC: 2.9 10*3/uL — ABNORMAL LOW (ref 4.0–10.3)
lymph#: 0.6 10*3/uL — ABNORMAL LOW (ref 0.9–3.3)

## 2013-03-14 MED ORDER — DEXAMETHASONE SODIUM PHOSPHATE 10 MG/ML IJ SOLN
10.0000 mg | Freq: Once | INTRAMUSCULAR | Status: AC
  Administered 2013-03-14: 10 mg via INTRAVENOUS

## 2013-03-14 MED ORDER — SODIUM CHLORIDE 0.9 % IV SOLN
Freq: Once | INTRAVENOUS | Status: AC
  Administered 2013-03-14: 10:00:00 via INTRAVENOUS

## 2013-03-14 MED ORDER — SODIUM CHLORIDE 0.9 % IV SOLN
375.0000 mg/m2 | Freq: Once | INTRAVENOUS | Status: AC
  Administered 2013-03-14: 1000 mg via INTRAVENOUS
  Filled 2013-03-14: qty 100

## 2013-03-14 MED ORDER — ONDANSETRON 8 MG/50ML IVPB (CHCC)
8.0000 mg | Freq: Once | INTRAVENOUS | Status: AC
  Administered 2013-03-14: 8 mg via INTRAVENOUS

## 2013-03-14 MED ORDER — DIPHENHYDRAMINE HCL 25 MG PO CAPS
50.0000 mg | ORAL_CAPSULE | Freq: Once | ORAL | Status: AC
  Administered 2013-03-14: 50 mg via ORAL

## 2013-03-14 MED ORDER — ACETAMINOPHEN 325 MG PO TABS
650.0000 mg | ORAL_TABLET | Freq: Once | ORAL | Status: AC
  Administered 2013-03-14: 650 mg via ORAL

## 2013-03-14 MED ORDER — SODIUM CHLORIDE 0.9 % IV SOLN
90.0000 mg/m2 | Freq: Once | INTRAVENOUS | Status: AC
  Administered 2013-03-14: 240 mg via INTRAVENOUS
  Filled 2013-03-14: qty 48

## 2013-03-14 NOTE — Patient Instructions (Addendum)
[facility] Discharge Instructions for Patients Receiving Chemotherapy  Today you received the following chemotherapy agents Rituxan and Treanda.  To help prevent nausea and vomiting after your treatment, we encourage you to take your nausea medication.   If you develop nausea and vomiting that is not controlled by your nausea medication, call the clinic.   BELOW ARE SYMPTOMS THAT SHOULD BE REPORTED IMMEDIATELY:  *FEVER GREATER THAN 100.5 F  *CHILLS WITH OR WITHOUT FEVER  NAUSEA AND VOMITING THAT IS NOT CONTROLLED WITH YOUR NAUSEA MEDICATION  *UNUSUAL SHORTNESS OF BREATH  *UNUSUAL BRUISING OR BLEEDING  TENDERNESS IN MOUTH AND THROAT WITH OR WITHOUT PRESENCE OF ULCERS  *URINARY PROBLEMS  *BOWEL PROBLEMS  UNUSUAL RASH Items with * indicate a potential emergency and should be followed up as soon as possible.  Feel free to call the clinic you have any questions or concerns. The clinic phone number is [phone].    

## 2013-03-14 NOTE — Telephone Encounter (Signed)
Gave pt appt for lab and ML for August 2014 emailed Marcelino Duster regarding chemo

## 2013-03-14 NOTE — Progress Notes (Signed)
Hematology and Oncology Follow Up Visit  Edward Clarke [id number] 09-Oct-1965 47 y.o. 03/14/2013 9:10 AM [name],AJAY, MDNo ref. 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  He had presented with IgM about 3 g/dL and M-spike about 2 g/dL.  His workup that includes imaging studies, CT scan of the chest, abdomen and pelvis and skeletal survey had been really unrevealing.  Bone marrow biopsy confirmed the diagnosis.   Current therapy: Started chemotherapy with Bendamustine and Rituxan on 02/14/13. He is here for cycle 2.   Interim History:  Mr. Edward Clarke presents today for a follow-up visit. The patient started his chemotherapy in 02/2013 without complications. States his energy level is better. He is sleeping less. He reports mild dyspnea on exertion, but no shortness of breath at rest. He experienced nausea with dry heaves her once she is following his chemotherapy. Using his anti-emetics as needed. Reports that he also had difficulty with constipation around the time of his chemotherapy. Constipation is now resolved.  He has not reported any major appetite changes.  Denies chest pain. No rashes.  He has not reported any alternation in his mental status.  He did not report any neurological symptoms.  Overall, performance status and activity level remained reasonable. He reports overall improvement in his health.   Medications: I have reviewed the patient's current medications.  Current Outpatient Prescriptions  Medication Sig Dispense Refill  . aspirin EC 81 MG tablet Take 81 mg by mouth every morning.      . Glycerin-Hypromellose-PEG 400 ([facility] DRY EYE RELIEF) 0.2-0.2-1 % SOLN Place 1-2 drops into both eyes daily as needed (For dry eyes.).      [name] ibuprofen (ADVIL,MOTRIN) 200 MG tablet Take 400-800 mg by mouth every 8 (eight) hours as needed for headache.      . insulin aspart  (NOVOLOG FLEXPEN) 100 unit/mL SOLN FlexPen Inject 5-20 Units into the skin See admin instructions. He uses a sliding scale.      . Insulin Glargine (LANTUS SOLOSTAR) 100 UNIT/ML SOPN Inject 32 Units into the skin every morning.       [name] lisinopril (PRINIVIL,ZESTRIL) 10 MG tablet Take 10 mg by mouth every morning.       . metFORMIN (GLUCOPHAGE) 1000 MG tablet Take 1,000 mg by mouth 2 (two) times daily.       . Multiple Vitamin (MULTIVITAMIN WITH MINERALS) TABS Take 1 tablet by mouth every morning.      . prochlorperazine (COMPAZINE) 10 MG tablet Take 1 tablet (10 mg total) by mouth every 6 (six) hours as needed.  30 tablet  0  . simvastatin (ZOCOR) 40 MG tablet Take 40 mg by mouth every morning.       . tobramycin (TOBREX) 0.3 % ophthalmic solution Place 1 drop into both eyes See admin instructions. He uses for 2 days prior to eye procedures.       No current facility-administered medications for this visit.     Allergies:  Allergies  Allergen Reactions  . Benazepril Other (See Comments)    Reaction unknown - may have been headaches    Past Medical History, Surgical history, Social history, and Family History were reviewed and updated.  Review of Systems: Constitutional:  Negative for fever, chills, night sweats, anorexia, weight loss, pain. Cardiovascular: no chest pain or dyspnea on exertion Respiratory: negative Neurological: negative Dermatological: negative ENT: negative Skin: Negative. Gastrointestinal: negative Genito-Urinary: negative Hematological and  Lymphatic: negative Breast: negative Musculoskeletal: negative Remaining ROS negative.  Physical Exam: Blood pressure 138/82, pulse 81, temperature 97.1 F (36.2 C), temperature source Oral, resp. rate 18, height 6\' 1"  (1.854 m), weight 304 lb (137.893 kg). ECOG: 1 General appearance: alert Head: Normocephalic, without obvious abnormality, atraumatic Neck: no adenopathy, no carotid bruit, no JVD, supple, symmetrical,  trachea midline and thyroid not enlarged, symmetric, no tenderness/mass/nodules Lymph nodes: Cervical, supraclavicular, and axillary nodes normal. Heart:regular rate and rhythm, S1, S2 normal, no murmur, click, rub or gallop Lung:chest clear, no wheezing, rales, normal symmetric air entry Abdomen: soft, non-tender, without masses or organomegaly EXT:no erythema, induration, or nodules   Lab Results: Lab Results  Component Value Date   WBC 2.9* 03/14/2013   HGB 8.7* 03/14/2013   HCT 26.9* 03/14/2013   MCV 93.4 03/14/2013   PLT 131* 03/14/2013     Chemistry      Component Value Date/Time   NA 136 03/12/2013 1025   NA 132* 02/14/2013 0758   K 4.4 03/12/2013 1025   K 4.2 02/14/2013 0758   CL 102 03/12/2013 1025   CL 101 02/14/2013 0758   CO2 28 03/12/2013 1025   CO2 21* 02/14/2013 0758   BUN 14 03/12/2013 1025   BUN 16.9 02/14/2013 0758   CREATININE 1.3 03/12/2013 1025   CREATININE 1.0 02/14/2013 0758      Component Value Date/Time   CALCIUM 10.3 03/12/2013 1025   CALCIUM 11.0* 02/14/2013 0758   ALKPHOS 50 02/14/2013 0758   ALKPHOS 51 11/24/2010 1320   AST 26 02/14/2013 0758   AST 18 11/24/2010 1320   ALT 13 02/14/2013 0758   ALT 17 11/24/2010 1320   BILITOT 0.42 02/14/2013 0758   BILITOT 0.4 11/24/2010 1320      Impression and Plan:  This is a pleasant 47 year old gentleman with the following issues: 1. IgM monoclonal paraproteinemia. This is likely due to  lymphoproliferative disorder such as Waldenstrm's or lymphoplasmacytic lymphoma.  I do not think he has multiple myeloma.  He is status post one cycle of bendamustine and Rituxan. He has grade 1 fatigue and grade 1 nausea. Overall he tolerated the chemotherapy well with an improvement in his performance status. He will Proceed with cycle #2 today with any delay. 2. Anemia: likely related to #1 and recent chemotherapy. He has no active bleeding. No transfusion is indicated at this point in time.  3. Hyperviscosity syndrome: I discussed as a  potential dangers complication from an IgM paraproteinemia I also discussed with him the possible need for a plasma exchange in case he develops a hyperviscosity syndrome. At this time I see no evidence of that he has no neurological symptoms or cardiovascular symptoms to suggest that. However I have educated him about it I talked to him about the possible need for a plasma exchange should that happens.  4.  Constipation: I have advised him on a bowel regimen. 5. Nausea/vomiting prophylaxis: He has Compazine at home. 6. followup: On 04/11/2013 for cycle 3 chemotherapy.    Ether Goebel 7/10/20149:10 AM

## 2013-03-15 ENCOUNTER — Ambulatory Visit (HOSPITAL_BASED_OUTPATIENT_CLINIC_OR_DEPARTMENT_OTHER): Payer: Medicare Other

## 2013-03-15 ENCOUNTER — Ambulatory Visit (INDEPENDENT_AMBULATORY_CARE_PROVIDER_SITE_OTHER): Payer: Medicare Other | Admitting: Endocrinology

## 2013-03-15 ENCOUNTER — Encounter: Payer: Self-pay | Admitting: Endocrinology

## 2013-03-15 ENCOUNTER — Telehealth: Payer: Self-pay | Admitting: *Deleted

## 2013-03-15 VITALS — BP 130/70 | HR 77 | Temp 98.5°F | Ht 74.0 in | Wt 309.9 lb

## 2013-03-15 VITALS — BP 165/84 | HR 76 | Temp 98.1°F | Resp 20

## 2013-03-15 DIAGNOSIS — IMO0002 Reserved for concepts with insufficient information to code with codable children: Secondary | ICD-10-CM | POA: Insufficient documentation

## 2013-03-15 DIAGNOSIS — E1165 Type 2 diabetes mellitus with hyperglycemia: Secondary | ICD-10-CM | POA: Insufficient documentation

## 2013-03-15 DIAGNOSIS — Z5111 Encounter for antineoplastic chemotherapy: Secondary | ICD-10-CM | POA: Diagnosis not present

## 2013-03-15 DIAGNOSIS — D472 Monoclonal gammopathy: Secondary | ICD-10-CM | POA: Diagnosis not present

## 2013-03-15 DIAGNOSIS — I10 Essential (primary) hypertension: Secondary | ICD-10-CM

## 2013-03-15 DIAGNOSIS — IMO0001 Reserved for inherently not codable concepts without codable children: Secondary | ICD-10-CM

## 2013-03-15 DIAGNOSIS — E785 Hyperlipidemia, unspecified: Secondary | ICD-10-CM

## 2013-03-15 MED ORDER — ONDANSETRON 8 MG/50ML IVPB (CHCC)
8.0000 mg | Freq: Once | INTRAVENOUS | Status: AC
  Administered 2013-03-15: 8 mg via INTRAVENOUS

## 2013-03-15 MED ORDER — BENDAMUSTINE HCL (LYOPHILIZED PWD) CHEMO INJECTION 100MG
90.0000 mg/m2 | Freq: Once | INTRAVENOUS | Status: AC
  Administered 2013-03-15: 240 mg via INTRAVENOUS
  Filled 2013-03-15: qty 48

## 2013-03-15 MED ORDER — SODIUM CHLORIDE 0.9 % IV SOLN
Freq: Once | INTRAVENOUS | Status: AC
  Administered 2013-03-15: 10:00:00 via INTRAVENOUS

## 2013-03-15 MED ORDER — DEXAMETHASONE SODIUM PHOSPHATE 10 MG/ML IJ SOLN
10.0000 mg | Freq: Once | INTRAMUSCULAR | Status: AC
  Administered 2013-03-15: 10 mg via INTRAVENOUS

## 2013-03-15 NOTE — Patient Instructions (Signed)
May use Relion brand Regular insulin instead of Novolog'   Stay on Lantus 32 unless AM SUGAR is >130 or <90 for 3 days in a row

## 2013-03-15 NOTE — Progress Notes (Signed)
Patient ID: Edward Clarke, male   DOB: Jan 21, 1966, 47 y.o.   MRN: [id number] Reason for Appointment: Diabetes follow-up   History of Present Illness   Diagnosis: Type 2 diabetes mellitus, date of diagnosis: 2005.  Complications: none.  Difficulties with medication adherence: Frequent, financial limitations, not on prescribed metformin. Out of Novolog for 4-6 weeks Proper timing of medications in relation to meals: Yes.  Monitors blood glucose: Once a day.  Glucometer: One Touch.  Blood Glucose readings: 80-193, usually pc 140-150 .   INSULIN: Lantus 32-36 units in the evening, variable doses, NovoLog none recently  Hypoglycemia frequency: Rarely now.  Meals: Usually 2 meals a day (supper 5-9 pm) and low fat, mostly vegetables.  Carbohydrate intake: variable.  Food preferences: usually low fat.  Physical activity: exercise: minimal  Retinal exam: Most recent: 2014.   The patient is seen today in follow up of T2 DM. His glucose control is again suboptimal because of issues with compliance and not being able to afford his medications and insulin. A1c has ranged between 9 and 11.6 in the last year. He now says that he has been taking his Lantus but not the NovoLog. This is again because of financial difficulties and depending on samples. Does have some readings over 200 after meals and is taking a little more than usual. However when his blood sugar is high he takes extra Lantus and did not report problems with insulin supply. He also has had chemotherapy and on those days does not eat as much this. This did not increase his blood sugars also  The last HbgA1c was reported as 7.9, previously 8.3 in 4/14.  HYPERTENSION:  has had long-standing hypertension and this has been recently this has been easier to control. Currently on lisinopril only with good control  HYPERLIPIDEMIA:         The lipid abnormality consists of elevated LDL treated with simvastatin .    Appointment on 03/12/2013   Component Date Value Range Status  . WBC 03/14/2013 2.9* 4.0 - 10.3 10e3/uL Final  . NEUT# 03/14/2013 1.9  1.5 - 6.5 10e3/uL Final  . HGB 03/14/2013 8.7* 13.0 - 17.1 g/dL Final  . HCT 78/29/5621 26.9* 38.4 - 49.9 % Final  . Platelets 03/14/2013 131* 140 - 400 10e3/uL Final  . MCV 03/14/2013 93.4  79.3 - 98.0 fL Final  . MCH 03/14/2013 30.2  27.2 - 33.4 pg Final  . MCHC 03/14/2013 32.3  32.0 - 36.0 g/dL Final  . RBC 30/86/5784 2.88* 4.20 - 5.82 10e6/uL Final  . RDW 03/14/2013 18.3* 11.0 - 14.6 % Final  . lymph# 03/14/2013 0.6* 0.9 - 3.3 10e3/uL Final  . MONO# 03/14/2013 0.3  0.1 - 0.9 10e3/uL Final  . Eosinophils Absolute 03/14/2013 0.1  0.0 - 0.5 10e3/uL Final  . Basophils Absolute 03/14/2013 0.0  0.0 - 0.1 10e3/uL Final  . NEUT% 03/14/2013 65.5  39.0 - 75.0 % Final  . LYMPH% 03/14/2013 18.8  14.0 - 49.0 % Final  . MONO% 03/14/2013 11.6  0.0 - 14.0 % Final  . EOS% 03/14/2013 3.1  0.0 - 7.0 % Final  . BASO% 03/14/2013 1.0  0.0 - 2.0 % Final  . nRBC 03/14/2013 0  0 - 0 % Final  . Sodium 03/14/2013 136  136 - 145 mEq/L Final  . Potassium 03/14/2013 4.4  3.5 - 5.1 mEq/L Final  . Chloride 03/14/2013 103  98 - 109 mEq/L Final  . CO2 03/14/2013 24  22 - 29 mEq/L Final  .  Glucose 03/14/2013 113  70 - 140 mg/dl Final  . BUN 16/06/9603 17.4  7.0 - 26.0 mg/dL Final  . Creatinine 54/05/8118 1.4* 0.7 - 1.3 mg/dL Final  . Total Bilirubin 03/14/2013 0.30  0.20 - 1.20 mg/dL Final  . Alkaline Phosphatase 03/14/2013 46  40 - 150 U/L Final  . AST 03/14/2013 23  5 - 34 U/L Final  . ALT 03/14/2013 22  0 - 55 U/L Final  . Total Protein 03/14/2013 10.8* 6.4 - 8.3 g/dL Final  . Albumin 14/78/2956 3.4* 3.5 - 5.0 g/dL Final  . Calcium 21/30/8657 11.2* 8.4 - 10.4 mg/dL Final  . Hemoglobin Q4O 03/12/2013 7.9* 4.6 - 6.5 % Final   Glycemic Control Guidelines for People with Diabetes:Non Diabetic:  <6%Goal of Therapy: <7%Additional Action Suggested:  >8%   . Sodium 03/12/2013 136  135 - 145 mEq/L Final  .  Potassium 03/12/2013 4.4  3.5 - 5.1 mEq/L Final  . Chloride 03/12/2013 102  96 - 112 mEq/L Final  . CO2 03/12/2013 28  19 - 32 mEq/L Final  . Glucose, Bld 03/12/2013 121* 70 - 99 mg/dL Final  . BUN 96/29/5284 14  6 - 23 mg/dL Final  . Creatinine, Ser 03/12/2013 1.3  0.4 - 1.5 mg/dL Final  . Calcium 13/24/4010 10.3  8.4 - 10.5 mg/dL Final  . GFR 27/25/3664 78.44  >60.00 mL/min Final      Medication List       This list is accurate as of: 03/15/13  4:05 PM.  Always use your most recent med list.               aspirin EC 81 MG tablet  Take 81 mg by mouth every morning.     [facility] DRY EYE RELIEF 0.2-0.2-1 % Soln  Generic drug:  Glycerin-Hypromellose-PEG 400  Place 1-2 drops into both eyes daily as needed (For dry eyes.).     ibuprofen 200 MG tablet  Commonly known as:  ADVIL,MOTRIN  Take 400-800 mg by mouth every 8 (eight) hours as needed for headache.     LANTUS SOLOSTAR 100 UNIT/ML Sopn  Generic drug:  Insulin Glargine  Inject 32 Units into the skin every morning.     lisinopril 10 MG tablet  Commonly known as:  PRINIVIL,ZESTRIL  Take 10 mg by mouth every morning.     metFORMIN 1000 MG tablet  Commonly known as:  GLUCOPHAGE  Take 1,000 mg by mouth 2 (two) times daily.     multivitamin with minerals Tabs  Take 1 tablet by mouth every morning.     NOVOLOG FLEXPEN 100 UNIT/ML Sopn FlexPen  Generic drug:  insulin aspart  Inject 5-20 Units into the skin See admin instructions. He uses a sliding scale.     prochlorperazine 10 MG tablet  Commonly known as:  COMPAZINE  Take 1 tablet (10 mg total) by mouth every 6 (six) hours as needed.     simvastatin 40 MG tablet  Commonly known as:  ZOCOR  Take 40 mg by mouth every morning.     tobramycin 0.3 % ophthalmic solution  Commonly known as:  TOBREX  Place 1 drop into both eyes See admin instructions. He uses for 2 days prior to eye procedures.        Allergies:  Allergies  Allergen Reactions  . Benazepril Other (See  Comments)    Reaction unknown - may have been headaches    Past Medical History  Diagnosis Date  . Monoclonal paraproteinemia   .  Diabetes mellitus   . Hypertension   . Obesity   . Hyperlipidemia     Past Surgical History  Procedure Laterality Date  . Appendectomy    . Eye surgery    . Tonsillectomy      No family history on file.  Social History:  reports that he has quit smoking. His smoking use included Cigarettes. He smoked 0.00 packs per day. He does not have any smokeless tobacco history on file. He reports that he drinks about 1.2 ounces of alcohol per week. He reports that he uses illicit drugs (Marijuana) about 7 times per week.  Review of Systems - Cardiovascular ROS: positive for - hypertension Started chemotherapy for myeloma with Bendamustine and Rituxan on 02/14/13. Total protein level still high No history of numbness or tingling in his feet No history of recent bowel changes Physical complaining of significant fatigue   Examination:   BP 130/70  Pulse 77  Temp(Src) 98.5 F (36.9 C)  Ht 6\' 2"  (1.88 m)  Wt 309 lb 14.4 oz (140.57 kg)  BMI 39.77 kg/m2  SpO2 98%  Body mass index is 39.77 kg/(m^2).   Assesment:   1. Diabetes type 2, uncontrolled - 250.02  The patient's diabetes control appears to be reasonably well controlled despite not taking any mealtime insulin.  PROBLEMS identified: Currently not taking any mealtime coverage and blood sugars are often over 200 Overall control is still suboptimal with A1c 7.9 Not exercising Taking extra Lantus insulin instead of NovoLog when blood sugars are high Not adjusting Lantus based on fasting reading Sometimes eating high fat items like hotdogs  2. Mild increase in creatinine likely to be from multifactorial etiologies with his myeloma and hypertension and will need to be watched. Consider reducing metformin if any higher  3. Hypertension: Well controlled now, to continue same  medications  PLAN:  Discussed the problems identified above and measures to improve them  Advised him to take mealtime insulin consistently either with brand name NovoLog or OTC [employer] brand regular insulin, discussed blood sugar targets and need for checking readings before and after meals to help insulin adjustment Discussed adjusting Lantus insulin based on fasting readings by 2-4 units Continue to take NovoLog or regular insulin before meals based on the size Reduce high-fat meals and have balanced meals with reduce carbohydrate Start exercise when able to  Counseling time over 50% of today's 25 minute visit  Lynnell Fiumara 03/15/2013, 4:05 PM

## 2013-03-15 NOTE — Telephone Encounter (Signed)
Per staff message and POF I tried to schedule appts. The lab/MD visit are to late in the day for  7hr treatments. Sent scheduler to advise new appts.   JMW

## 2013-03-15 NOTE — Patient Instructions (Signed)
[facility] Discharge Instructions for Patients Receiving Chemotherapy  Today you received the following chemotherapy agents Treanda To help prevent nausea and vomiting after your treatment, we encourage you to take your nausea medication as needed   If you develop nausea and vomiting that is not controlled by your nausea medication, call the clinic.   BELOW ARE SYMPTOMS THAT SHOULD BE REPORTED IMMEDIATELY:  *FEVER GREATER THAN 100.5 F  *CHILLS WITH OR WITHOUT FEVER  NAUSEA AND VOMITING THAT IS NOT CONTROLLED WITH YOUR NAUSEA MEDICATION  *UNUSUAL SHORTNESS OF BREATH  *UNUSUAL BRUISING OR BLEEDING  TENDERNESS IN MOUTH AND THROAT WITH OR WITHOUT PRESENCE OF ULCERS  *URINARY PROBLEMS  *BOWEL PROBLEMS  UNUSUAL RASH Items with * indicate a potential emergency and should be followed up as soon as possible.  Feel free to call the clinic you have any questions or concerns. The clinic phone number is [phone].

## 2013-03-18 ENCOUNTER — Telehealth: Payer: Self-pay | Admitting: *Deleted

## 2013-03-18 DIAGNOSIS — I10 Essential (primary) hypertension: Secondary | ICD-10-CM | POA: Insufficient documentation

## 2013-03-18 DIAGNOSIS — E785 Hyperlipidemia, unspecified: Secondary | ICD-10-CM | POA: Insufficient documentation

## 2013-03-18 LAB — SPEP & IFE WITH QIG
Albumin ELP: 40.6 % — ABNORMAL LOW (ref 55.8–66.1)
Beta Globulin: 3.3 % — ABNORMAL LOW (ref 4.7–7.2)
IgA: 91 mg/dL (ref 68–379)
IgG (Immunoglobin G), Serum: 989 mg/dL (ref 650–1600)
IgM, Serum: 5570 mg/dL — ABNORMAL HIGH (ref 41–251)
M-Spike, %: 3.55 g/dL
Total Protein, Serum Electrophoresis: 10.3 g/dL — ABNORMAL HIGH (ref 6.0–8.3)

## 2013-03-18 NOTE — Telephone Encounter (Signed)
Per scheduler appts for 8/6 and 9/4 have been moved to earlier appts. I have scheduled treatment for 9/4, but MD appt for 8/6 still to late in the day. Scheduler notified new appt needed. JMW

## 2013-03-19 ENCOUNTER — Telehealth: Payer: Self-pay | Admitting: *Deleted

## 2013-03-19 ENCOUNTER — Telehealth: Payer: Self-pay | Admitting: Oncology

## 2013-03-19 NOTE — Telephone Encounter (Signed)
Per staff phone call and POF I have schedueld appts.  JMW  

## 2013-03-19 NOTE — Telephone Encounter (Signed)
Called pt and left message for 04/09/13 lab, chemo and ML advise pt to come get calendar for September

## 2013-04-01 DIAGNOSIS — H3581 Retinal edema: Secondary | ICD-10-CM | POA: Diagnosis not present

## 2013-04-01 DIAGNOSIS — H348192 Central retinal vein occlusion, unspecified eye, stable: Secondary | ICD-10-CM | POA: Diagnosis not present

## 2013-04-10 ENCOUNTER — Ambulatory Visit (HOSPITAL_BASED_OUTPATIENT_CLINIC_OR_DEPARTMENT_OTHER): Payer: Medicare Other | Admitting: Oncology

## 2013-04-10 ENCOUNTER — Other Ambulatory Visit (HOSPITAL_BASED_OUTPATIENT_CLINIC_OR_DEPARTMENT_OTHER): Payer: Medicare Other | Admitting: Lab

## 2013-04-10 ENCOUNTER — Encounter: Payer: Self-pay | Admitting: Oncology

## 2013-04-10 VITALS — BP 118/74 | HR 74 | Temp 98.0°F | Resp 20 | Ht 74.0 in | Wt 301.3 lb

## 2013-04-10 DIAGNOSIS — C8589 Other specified types of non-Hodgkin lymphoma, extranodal and solid organ sites: Secondary | ICD-10-CM | POA: Diagnosis not present

## 2013-04-10 DIAGNOSIS — D472 Monoclonal gammopathy: Secondary | ICD-10-CM | POA: Diagnosis not present

## 2013-04-10 DIAGNOSIS — C8590 Non-Hodgkin lymphoma, unspecified, unspecified site: Secondary | ICD-10-CM

## 2013-04-10 LAB — CBC WITH DIFFERENTIAL/PLATELET
BASO%: 0.4 % (ref 0.0–2.0)
Basophils Absolute: 0 10*3/uL (ref 0.0–0.1)
EOS%: 2.9 % (ref 0.0–7.0)
HCT: 28.8 % — ABNORMAL LOW (ref 38.4–49.9)
HGB: 9.6 g/dL — ABNORMAL LOW (ref 13.0–17.1)
LYMPH%: 20.6 % (ref 14.0–49.0)
MCH: 31.2 pg (ref 27.2–33.4)
MCHC: 33.3 g/dL (ref 32.0–36.0)
NEUT%: 67.3 % (ref 39.0–75.0)
Platelets: 151 10*3/uL (ref 140–400)
lymph#: 0.5 10*3/uL — ABNORMAL LOW (ref 0.9–3.3)

## 2013-04-10 LAB — COMPREHENSIVE METABOLIC PANEL (CC13)
Albumin: 3.7 g/dL (ref 3.5–5.0)
Alkaline Phosphatase: 60 U/L (ref 40–150)
BUN: 21.9 mg/dL (ref 7.0–26.0)
Calcium: 10.3 mg/dL (ref 8.4–10.4)
Chloride: 105 mEq/L (ref 98–109)
Creatinine: 1.4 mg/dL — ABNORMAL HIGH (ref 0.7–1.3)
Glucose: 161 mg/dl — ABNORMAL HIGH (ref 70–140)
Potassium: 5 mEq/L (ref 3.5–5.1)

## 2013-04-10 NOTE — Progress Notes (Signed)
Hematology and Oncology Follow Up Visit  Edward Clarke [id number] 1966/03/23 47 y.o. 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  He had presented with IgM about 3 g/dL and M-spike about 2 g/dL.  His workup that includes imaging studies, CT scan of the chest, abdomen and pelvis and skeletal survey had been really unrevealing.  Bone marrow biopsy confirmed the diagnosis.   Current therapy: Started chemotherapy with Bendamustine and Rituxan on 02/14/13. He is here for cycle 3.   Interim History:  Edward Clarke presents today for a follow-up visit. The patient started his chemotherapy in 02/2013 without complications. States his energy level is better. He is sleeping less. He reports mild dyspnea on exertion, but no shortness of breath at rest. He experienced nausea with dry heaves her once she is following his chemotherapy. Using his anti-emetics as needed. Reports that he also had difficulty with constipation around the time of his chemotherapy. Constipation is now resolved.  He has not reported any major appetite changes.  Denies chest pain. No rashes.  He has not reported any alternation in his mental status.  He did not report any neurological symptoms.  Overall, performance status and activity level remained reasonable. He reports overall improvement in his health.   Medications: I have reviewed the patient's current medications.  Current Outpatient Prescriptions  Medication Sig Dispense Refill  . aspirin EC 81 MG tablet Take 81 mg by mouth every morning.      . Bendamustine HCl (TREANDA IV) Inject 240 mg into the vein. 2 times per month      . Glycerin-Hypromellose-PEG 400 ([facility] DRY EYE RELIEF) 0.2-0.2-1 % SOLN Place 1-2 drops into both eyes daily as needed (For dry eyes.).      [name] ibuprofen (ADVIL,MOTRIN) 200 MG tablet Take 400-800 mg by  mouth every 8 (eight) hours as needed for headache.      . insulin aspart (NOVOLOG FLEXPEN) 100 unit/mL SOLN FlexPen Inject 5-20 Units into the skin See admin instructions. He uses a sliding scale.      . Insulin Glargine (LANTUS SOLOSTAR) 100 UNIT/ML SOPN Inject 32 Units into the skin every morning.       [name] lisinopril (PRINIVIL,ZESTRIL) 10 MG tablet Take 10 mg by mouth every morning.       . metFORMIN (GLUCOPHAGE) 1000 MG tablet Take 1,000 mg by mouth 2 (two) times daily.       . Multiple Vitamin (MULTIVITAMIN WITH MINERALS) TABS Take 1 tablet by mouth every morning.      . prochlorperazine (COMPAZINE) 10 MG tablet Take 1 tablet (10 mg total) by mouth every 6 (six) hours as needed.  30 tablet  0  . RiTUXimab (RITUXAN IV) Inject into the vein.      . simvastatin (ZOCOR) 40 MG tablet Take 40 mg by mouth every morning.       . tobramycin (TOBREX) 0.3 % ophthalmic solution Place 1 drop into both eyes See admin instructions. He uses for 2 days prior to eye procedures.       No current facility-administered medications for this visit.     Allergies:  Allergies  Allergen Reactions  . Benazepril Other (See Comments)    Reaction unknown - may have been headaches    Past Medical History, Surgical history, Social history, and Family History were reviewed and updated.  Review of Systems: Constitutional:  Negative for  fever, chills, night sweats, anorexia, weight loss, pain. Cardiovascular: no chest pain or dyspnea on exertion Respiratory: negative Neurological: negative Dermatological: negative ENT: negative Skin: Negative. Gastrointestinal: negative Genito-Urinary: negative Hematological and Lymphatic: negative Breast: negative Musculoskeletal: negative Remaining ROS negative.  Physical Exam: Blood pressure 118/74, pulse 74, temperature 98 F (36.7 C), temperature source Oral, resp. rate 20, height 6\' 2"  (1.88 m), weight 301 lb 4.8 oz (136.669 kg). ECOG: 1 General appearance:  alert Head: Normocephalic, without obvious abnormality, atraumatic Neck: no adenopathy, no carotid bruit, no JVD, supple, symmetrical, trachea midline and thyroid not enlarged, symmetric, no tenderness/mass/nodules Lymph nodes: Cervical, supraclavicular, and axillary nodes normal. Heart:regular rate and rhythm, S1, S2 normal, no murmur, click, rub or gallop Lung:chest clear, no wheezing, rales, normal symmetric air entry Abdomen: soft, non-tender, without masses or organomegaly EXT:no erythema, induration, or nodules   Lab Results: Lab Results  Component Value Date   WBC 2.4* 04/10/2013   HGB 9.6* 04/10/2013   HCT 28.8* 04/10/2013   MCV 93.5 04/10/2013   PLT 151 04/10/2013     Chemistry      Component Value Date/Time   NA 136 03/12/2013 1025   NA 132* 02/14/2013 0758   K 4.4 03/12/2013 1025   K 4.2 02/14/2013 0758   CL 102 03/12/2013 1025   CL 101 02/14/2013 0758   CO2 28 03/12/2013 1025   CO2 21* 02/14/2013 0758   BUN 14 03/12/2013 1025   BUN 16.9 02/14/2013 0758   CREATININE 1.3 03/12/2013 1025   CREATININE 1.0 02/14/2013 0758      Component Value Date/Time   CALCIUM 10.3 03/12/2013 1025   CALCIUM 11.0* 02/14/2013 0758   ALKPHOS 50 02/14/2013 0758   ALKPHOS 51 11/24/2010 1320   AST 26 02/14/2013 0758   AST 18 11/24/2010 1320   ALT 13 02/14/2013 0758   ALT 17 11/24/2010 1320   BILITOT 0.42 02/14/2013 0758   BILITOT 0.4 11/24/2010 1320      Impression and Plan:  This is a pleasant 47 year old gentleman with the following issues: 1. IgM monoclonal paraproteinemia. This is likely due to  lymphoproliferative disorder such as Waldenstrm's or lymphoplasmacytic lymphoma.  I do not think he has multiple myeloma.  He is status post two cycles of bendamustine and Rituxan. He has grade 1 fatigue and grade 1 nausea. Overall he tolerated the chemotherapy well with an improvement in his performance status. He will Proceed with cycle #3 tomorrow without any delay. 2. Anemia: likely related to #1 and recent  chemotherapy. He has no active bleeding. No transfusion is indicated at this point in time.  3. Hyperviscosity syndrome: I discussed as a potential dangers complication from an IgM paraproteinemia I also discussed with him the possible need for a plasma exchange in case he develops a hyperviscosity syndrome. At this time I see no evidence of that he has no neurological symptoms or cardiovascular symptoms to suggest that. However I have educated him about it I talked to him about the possible need for a plasma exchange should that happens.  4.  Constipation: Resolved. 5. Nausea/vomiting prophylaxis: He has Compazine at home. 6. followup: 05/09/13 for cycle 4.    [place], [place] 8/6/201411:21 AM

## 2013-04-11 ENCOUNTER — Ambulatory Visit: Payer: Medicare Other | Admitting: Oncology

## 2013-04-11 ENCOUNTER — Other Ambulatory Visit: Payer: Medicare Other | Admitting: Lab

## 2013-04-11 ENCOUNTER — Ambulatory Visit (HOSPITAL_BASED_OUTPATIENT_CLINIC_OR_DEPARTMENT_OTHER): Payer: Medicare Other

## 2013-04-11 VITALS — BP 114/67 | HR 67 | Temp 97.9°F | Resp 18

## 2013-04-11 DIAGNOSIS — Z5112 Encounter for antineoplastic immunotherapy: Secondary | ICD-10-CM | POA: Diagnosis not present

## 2013-04-11 DIAGNOSIS — D472 Monoclonal gammopathy: Secondary | ICD-10-CM

## 2013-04-11 DIAGNOSIS — Z5111 Encounter for antineoplastic chemotherapy: Secondary | ICD-10-CM | POA: Diagnosis not present

## 2013-04-11 MED ORDER — SODIUM CHLORIDE 0.9 % IV SOLN
Freq: Once | INTRAVENOUS | Status: AC
  Administered 2013-04-11: 09:00:00 via INTRAVENOUS

## 2013-04-11 MED ORDER — DIPHENHYDRAMINE HCL 25 MG PO CAPS
50.0000 mg | ORAL_CAPSULE | Freq: Once | ORAL | Status: AC
  Administered 2013-04-11: 50 mg via ORAL

## 2013-04-11 MED ORDER — SODIUM CHLORIDE 0.9 % IV SOLN
90.0000 mg/m2 | Freq: Once | INTRAVENOUS | Status: AC
  Administered 2013-04-11: 240 mg via INTRAVENOUS
  Filled 2013-04-11: qty 48

## 2013-04-11 MED ORDER — SODIUM CHLORIDE 0.9 % IV SOLN
375.0000 mg/m2 | Freq: Once | INTRAVENOUS | Status: AC
  Administered 2013-04-11: 1000 mg via INTRAVENOUS
  Filled 2013-04-11: qty 100

## 2013-04-11 MED ORDER — ONDANSETRON 8 MG/50ML IVPB (CHCC)
8.0000 mg | Freq: Once | INTRAVENOUS | Status: AC
  Administered 2013-04-11: 8 mg via INTRAVENOUS

## 2013-04-11 MED ORDER — ACETAMINOPHEN 325 MG PO TABS
650.0000 mg | ORAL_TABLET | Freq: Once | ORAL | Status: AC
  Administered 2013-04-11: 650 mg via ORAL

## 2013-04-11 MED ORDER — DEXAMETHASONE SODIUM PHOSPHATE 10 MG/ML IJ SOLN
10.0000 mg | Freq: Once | INTRAMUSCULAR | Status: AC
  Administered 2013-04-11: 10 mg via INTRAVENOUS

## 2013-04-11 NOTE — Patient Instructions (Addendum)
[facility] Discharge Instructions for Patients Receiving Chemotherapy  Today you received the following chemotherapy agents Rituxan and Treanda.  To help prevent nausea and vomiting after your treatment, we encourage you to take your nausea medication.   If you develop nausea and vomiting that is not controlled by your nausea medication, call the clinic.   BELOW ARE SYMPTOMS THAT SHOULD BE REPORTED IMMEDIATELY:  *FEVER GREATER THAN 100.5 F  *CHILLS WITH OR WITHOUT FEVER  NAUSEA AND VOMITING THAT IS NOT CONTROLLED WITH YOUR NAUSEA MEDICATION  *UNUSUAL SHORTNESS OF BREATH  *UNUSUAL BRUISING OR BLEEDING  TENDERNESS IN MOUTH AND THROAT WITH OR WITHOUT PRESENCE OF ULCERS  *URINARY PROBLEMS  *BOWEL PROBLEMS  UNUSUAL RASH Items with * indicate a potential emergency and should be followed up as soon as possible.  Feel free to call the clinic you have any questions or concerns. The clinic phone number is [phone].    

## 2013-04-12 ENCOUNTER — Ambulatory Visit (HOSPITAL_BASED_OUTPATIENT_CLINIC_OR_DEPARTMENT_OTHER): Payer: Medicare Other

## 2013-04-12 VITALS — BP 127/71 | HR 63 | Temp 98.1°F | Resp 20

## 2013-04-12 DIAGNOSIS — Z5111 Encounter for antineoplastic chemotherapy: Secondary | ICD-10-CM | POA: Diagnosis not present

## 2013-04-12 DIAGNOSIS — D472 Monoclonal gammopathy: Secondary | ICD-10-CM | POA: Diagnosis not present

## 2013-04-12 MED ORDER — SODIUM CHLORIDE 0.9 % IV SOLN
90.0000 mg/m2 | Freq: Once | INTRAVENOUS | Status: AC
  Administered 2013-04-12: 240 mg via INTRAVENOUS
  Filled 2013-04-12: qty 48

## 2013-04-12 MED ORDER — DEXAMETHASONE SODIUM PHOSPHATE 10 MG/ML IJ SOLN
10.0000 mg | Freq: Once | INTRAMUSCULAR | Status: AC
  Administered 2013-04-12: 10 mg via INTRAVENOUS

## 2013-04-12 MED ORDER — SODIUM CHLORIDE 0.9 % IV SOLN
Freq: Once | INTRAVENOUS | Status: AC
  Administered 2013-04-12: 17:00:00 via INTRAVENOUS

## 2013-04-12 MED ORDER — ONDANSETRON 8 MG/50ML IVPB (CHCC)
8.0000 mg | Freq: Once | INTRAVENOUS | Status: AC
  Administered 2013-04-12: 8 mg via INTRAVENOUS

## 2013-04-12 NOTE — Patient Instructions (Addendum)
[facility] Discharge Instructions for Patients Receiving Chemotherapy  Today you received the following chemotherapy agents: Treanda  To help prevent nausea and vomiting after your treatment, we encourage you to take your nausea medication as directed by your physician.   If you develop nausea and vomiting that is not controlled by your nausea medication, call the clinic.   BELOW ARE SYMPTOMS THAT SHOULD BE REPORTED IMMEDIATELY:  *FEVER GREATER THAN 100.5 F  *CHILLS WITH OR WITHOUT FEVER  NAUSEA AND VOMITING THAT IS NOT CONTROLLED WITH YOUR NAUSEA MEDICATION  *UNUSUAL SHORTNESS OF BREATH  *UNUSUAL BRUISING OR BLEEDING  TENDERNESS IN MOUTH AND THROAT WITH OR WITHOUT PRESENCE OF ULCERS  *URINARY PROBLEMS  *BOWEL PROBLEMS  UNUSUAL RASH Items with * indicate a potential emergency and should be followed up as soon as possible.  Feel free to call the clinic you have any questions or concerns. The clinic phone number is [phone].    

## 2013-04-12 NOTE — Progress Notes (Signed)
Disability form requiring MD signature given to [facility] for review and to obtain signature. Patient's requests to be called when form is signed, and if not signed, to receive further instruction. Clayborn Heron, RN

## 2013-04-15 ENCOUNTER — Encounter: Payer: Self-pay | Admitting: Oncology

## 2013-04-15 NOTE — Progress Notes (Signed)
Put disability form on nurse's desk. °

## 2013-04-16 ENCOUNTER — Telehealth: Payer: Self-pay | Admitting: *Deleted

## 2013-04-16 NOTE — Telephone Encounter (Signed)
lmoam to call this office.

## 2013-04-16 NOTE — Telephone Encounter (Signed)
Per staff message and POF I have scheduled appts.  JMW  

## 2013-04-29 ENCOUNTER — Telehealth: Payer: Self-pay | Admitting: *Deleted

## 2013-04-29 DIAGNOSIS — H3320 Serous retinal detachment, unspecified eye: Secondary | ICD-10-CM | POA: Diagnosis not present

## 2013-04-29 DIAGNOSIS — H3581 Retinal edema: Secondary | ICD-10-CM | POA: Diagnosis not present

## 2013-04-29 DIAGNOSIS — H348192 Central retinal vein occlusion, unspecified eye, stable: Secondary | ICD-10-CM | POA: Diagnosis not present

## 2013-04-29 DIAGNOSIS — E1139 Type 2 diabetes mellitus with other diabetic ophthalmic complication: Secondary | ICD-10-CM | POA: Diagnosis not present

## 2013-04-29 NOTE — Telephone Encounter (Signed)
Patient calling about insurance papers. Left at front for patient p/u.

## 2013-05-09 ENCOUNTER — Other Ambulatory Visit (HOSPITAL_BASED_OUTPATIENT_CLINIC_OR_DEPARTMENT_OTHER): Payer: Medicare Other | Admitting: Lab

## 2013-05-09 ENCOUNTER — Other Ambulatory Visit: Payer: Medicare Other | Admitting: Lab

## 2013-05-09 ENCOUNTER — Ambulatory Visit (HOSPITAL_BASED_OUTPATIENT_CLINIC_OR_DEPARTMENT_OTHER): Payer: Medicare Other

## 2013-05-09 ENCOUNTER — Ambulatory Visit: Payer: Medicare Other | Admitting: Oncology

## 2013-05-09 ENCOUNTER — Telehealth: Payer: Self-pay | Admitting: *Deleted

## 2013-05-09 ENCOUNTER — Ambulatory Visit (HOSPITAL_BASED_OUTPATIENT_CLINIC_OR_DEPARTMENT_OTHER): Payer: Medicare Other | Admitting: Oncology

## 2013-05-09 ENCOUNTER — Telehealth: Payer: Self-pay | Admitting: Oncology

## 2013-05-09 VITALS — BP 140/86 | HR 76 | Temp 97.9°F | Resp 18

## 2013-05-09 VITALS — BP 128/85 | HR 67 | Temp 97.8°F | Resp 18 | Ht 74.0 in | Wt 310.6 lb

## 2013-05-09 DIAGNOSIS — D472 Monoclonal gammopathy: Secondary | ICD-10-CM | POA: Diagnosis not present

## 2013-05-09 DIAGNOSIS — Z5112 Encounter for antineoplastic immunotherapy: Secondary | ICD-10-CM

## 2013-05-09 DIAGNOSIS — D6481 Anemia due to antineoplastic chemotherapy: Secondary | ICD-10-CM | POA: Diagnosis not present

## 2013-05-09 DIAGNOSIS — Z5111 Encounter for antineoplastic chemotherapy: Secondary | ICD-10-CM | POA: Diagnosis not present

## 2013-05-09 DIAGNOSIS — C8590 Non-Hodgkin lymphoma, unspecified, unspecified site: Secondary | ICD-10-CM

## 2013-05-09 LAB — CBC WITH DIFFERENTIAL/PLATELET
Basophils Absolute: 0 10*3/uL (ref 0.0–0.1)
Eosinophils Absolute: 0.1 10*3/uL (ref 0.0–0.5)
HCT: 33 % — ABNORMAL LOW (ref 38.4–49.9)
LYMPH%: 24.4 % (ref 14.0–49.0)
MCV: 93.2 fL (ref 79.3–98.0)
MONO#: 0.4 10*3/uL (ref 0.1–0.9)
MONO%: 23.3 % — ABNORMAL HIGH (ref 0.0–14.0)
NEUT#: 0.8 10*3/uL — ABNORMAL LOW (ref 1.5–6.5)
NEUT%: 45 % (ref 39.0–75.0)
Platelets: 190 10*3/uL (ref 140–400)
RBC: 3.54 10*6/uL — ABNORMAL LOW (ref 4.20–5.82)

## 2013-05-09 LAB — COMPREHENSIVE METABOLIC PANEL (CC13)
Alkaline Phosphatase: 70 U/L (ref 40–150)
Creatinine: 1.1 mg/dL (ref 0.7–1.3)
Glucose: 160 mg/dl — ABNORMAL HIGH (ref 70–140)
Sodium: 138 mEq/L (ref 136–145)
Total Bilirubin: 0.28 mg/dL (ref 0.20–1.20)
Total Protein: 8 g/dL (ref 6.4–8.3)

## 2013-05-09 MED ORDER — SODIUM CHLORIDE 0.9 % IV SOLN
Freq: Once | INTRAVENOUS | Status: AC
  Administered 2013-05-09: 10:00:00 via INTRAVENOUS

## 2013-05-09 MED ORDER — RITUXIMAB CHEMO INJECTION 10 MG/ML
375.0000 mg/m2 | Freq: Once | INTRAVENOUS | Status: AC
  Administered 2013-05-09: 1000 mg via INTRAVENOUS
  Filled 2013-05-09: qty 100

## 2013-05-09 MED ORDER — ONDANSETRON 8 MG/50ML IVPB (CHCC)
8.0000 mg | Freq: Once | INTRAVENOUS | Status: AC
  Administered 2013-05-09: 8 mg via INTRAVENOUS

## 2013-05-09 MED ORDER — ACETAMINOPHEN 325 MG PO TABS
650.0000 mg | ORAL_TABLET | Freq: Once | ORAL | Status: AC
  Administered 2013-05-09: 650 mg via ORAL

## 2013-05-09 MED ORDER — SODIUM CHLORIDE 0.9 % IV SOLN
90.0000 mg/m2 | Freq: Once | INTRAVENOUS | Status: AC
  Administered 2013-05-09: 240 mg via INTRAVENOUS
  Filled 2013-05-09: qty 48

## 2013-05-09 MED ORDER — DEXAMETHASONE SODIUM PHOSPHATE 10 MG/ML IJ SOLN
10.0000 mg | Freq: Once | INTRAMUSCULAR | Status: AC
  Administered 2013-05-09: 10 mg via INTRAVENOUS

## 2013-05-09 MED ORDER — DIPHENHYDRAMINE HCL 25 MG PO CAPS
50.0000 mg | ORAL_CAPSULE | Freq: Once | ORAL | Status: AC
  Administered 2013-05-09: 50 mg via ORAL

## 2013-05-09 NOTE — Patient Instructions (Addendum)
[facility] Discharge Instructions for Patients Receiving Chemotherapy  Today you received the following chemotherapy agents: Rituxan, Treanda   To help prevent nausea and vomiting after your treatment, we encourage you to take your nausea medication as directed by your physician.   If you develop nausea and vomiting that is not controlled by your nausea medication, call the clinic.   BELOW ARE SYMPTOMS THAT SHOULD BE REPORTED IMMEDIATELY:  *FEVER GREATER THAN 100.5 F  *CHILLS WITH OR WITHOUT FEVER  NAUSEA AND VOMITING THAT IS NOT CONTROLLED WITH YOUR NAUSEA MEDICATION  *UNUSUAL SHORTNESS OF BREATH  *UNUSUAL BRUISING OR BLEEDING  TENDERNESS IN MOUTH AND THROAT WITH OR WITHOUT PRESENCE OF ULCERS  *URINARY PROBLEMS  *BOWEL PROBLEMS  UNUSUAL RASH Items with * indicate a potential emergency and should be followed up as soon as possible.  Feel free to call the clinic you have any questions or concerns. The clinic phone number is [phone].

## 2013-05-09 NOTE — Telephone Encounter (Signed)
gv adn printed appt sched and avs for pt for Sept OCT and NOV....MW added tx

## 2013-05-09 NOTE — Progress Notes (Signed)
Hematology and Oncology Follow Up Visit  Edward Clarke [id number] 21-Aug-1966 47 y.o. 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  He had presented with IgM about 3 g/dL and M-spike about 2 g/dL.  His workup that includes imaging studies, CT scan of the chest, abdomen and pelvis and skeletal survey had been really unrevealing. Bone marrow biopsy confirmed the diagnosis.   Current therapy: Started chemotherapy with Bendamustine and Rituxan on 02/14/13. He is here for cycle 4.   Interim History:  Edward Clarke presents today for a follow-up visit. The patient started his chemotherapy in 02/2013 without complications. States his energy level is much better. He is sleeping less. He reports no dyspnea on exertion, but no shortness of breath at rest. He reports no side effects due to chemotherapy. Constipation is now resolved.  He has not reported any major appetite changes.  Denies chest pain. No rashes.  He has not reported any alternation in his mental status.  He did not report any neurological symptoms.  Overall, performance status and activity level remained reasonable and continue to improve.    Medications: I have reviewed the patient's current medications.  Current Outpatient Prescriptions  Medication Sig Dispense Refill  . aspirin EC 81 MG tablet Take 81 mg by mouth every morning.      . Bendamustine HCl (TREANDA IV) Inject 240 mg into the vein. 2 times per month      . Glycerin-Hypromellose-PEG 400 ([facility] DRY EYE RELIEF) 0.2-0.2-1 % SOLN Place 1-2 drops into both eyes daily as needed (For dry eyes.).      [name] ibuprofen (ADVIL,MOTRIN) 200 MG tablet Take 400-800 mg by mouth every 8 (eight) hours as needed for headache.      . insulin aspart (NOVOLOG FLEXPEN) 100 unit/mL SOLN FlexPen Inject 5-20 Units into the skin See admin instructions. He  uses a sliding scale.      . Insulin Glargine (LANTUS SOLOSTAR) 100 UNIT/ML SOPN Inject 32 Units into the skin every morning.       . metFORMIN (GLUCOPHAGE) 1000 MG tablet Take 1,000 mg by mouth 2 (two) times daily.       . Multiple Vitamin (MULTIVITAMIN WITH MINERALS) TABS Take 1 tablet by mouth every morning.      . prochlorperazine (COMPAZINE) 10 MG tablet Take 1 tablet (10 mg total) by mouth every 6 (six) hours as needed.  30 tablet  0  . RiTUXimab (RITUXAN IV) Inject into the vein.      . simvastatin (ZOCOR) 40 MG tablet Take 40 mg by mouth every morning.       [name] lisinopril (PRINIVIL,ZESTRIL) 10 MG tablet Take 10 mg by mouth every morning.       . tobramycin (TOBREX) 0.3 % ophthalmic solution Place 1 drop into both eyes See admin instructions. He uses for 2 days prior to eye procedures.       No current facility-administered medications for this visit.     Allergies:  Allergies  Allergen Reactions  . Benazepril Other (See Comments)    Reaction unknown - may have been headaches    Past Medical History, Surgical history, Social history, and Family History were reviewed and updated.  Review of Systems:  Remaining ROS negative.  Physical Exam: Blood pressure 128/85, pulse 67, temperature 97.8 F (36.6 C), temperature source Oral, resp. rate 18, height 6\' 2"  (1.88 m),  weight 310 lb 9.6 oz (140.887 kg). ECOG: 1 General appearance: alert Head: Normocephalic, without obvious abnormality, atraumatic Neck: no adenopathy, no carotid bruit, no JVD, supple, symmetrical, trachea midline and thyroid not enlarged, symmetric, no tenderness/mass/nodules Lymph nodes: Cervical, supraclavicular, and axillary nodes normal. Heart:regular rate and rhythm, S1, S2 normal, no murmur, click, rub or gallop Lung:chest clear, no wheezing, rales, normal symmetric air entry Abdomen: soft, non-tender, without masses or organomegaly EXT:no erythema, induration, or nodules   Lab Results: Lab Results   Component Value Date   WBC 1.8* 05/09/2013   HGB 11.2* 05/09/2013   HCT 33.0* 05/09/2013   MCV 93.2 05/09/2013   PLT 190 05/09/2013     Chemistry      Component Value Date/Time   NA 138 04/10/2013 1041   NA 136 03/12/2013 1025   K 5.0 04/10/2013 1041   K 4.4 03/12/2013 1025   CL 102 03/12/2013 1025   CL 101 02/14/2013 0758   CO2 25 04/10/2013 1041   CO2 28 03/12/2013 1025   BUN 21.9 04/10/2013 1041   BUN 14 03/12/2013 1025   CREATININE 1.4* 04/10/2013 1041   CREATININE 1.3 03/12/2013 1025      Component Value Date/Time   CALCIUM 10.3 04/10/2013 1041   CALCIUM 10.3 03/12/2013 1025   ALKPHOS 60 04/10/2013 1041   ALKPHOS 51 11/24/2010 1320   AST 19 04/10/2013 1041   AST 18 11/24/2010 1320   ALT 15 04/10/2013 1041   ALT 17 11/24/2010 1320   BILITOT 0.27 04/10/2013 1041   BILITOT 0.4 11/24/2010 1320     Results for Edward Clarke, Edward Clarke (MRN [id number]) as of 05/09/2013 08:15  Ref. Range 02/14/2013 07:58 03/14/2013 08:38  IgM, Serum Latest Range: 41-251 mg/dL 4098 (H) 1191 (H)   Impression and Plan:  This is a pleasant 47 year old gentleman with the following issues: 1. IgM monoclonal paraproteinemia. This is likely due to  lymphoproliferative disorder such as Waldenstrm's or lymphoplasmacytic lymphoma.  He is status post three cycles of bendamustine and Rituxan. He has grade 1 fatigue and grade 1 nausea. Overall he tolerated the chemotherapy well with an improvement in his performance status. He will Proceed with cycle #4 without any delay. 2. Anemia: likely related to #1 and recent chemotherapy. He has no active bleeding. No transfusion is indicated at this point in time. Hgb is improving rapidly.  3. Hyperviscosity syndrome: I discussed as a potential dangers complication from an IgM paraproteinemia I also discussed with him the possible need for a plasma exchange in case he develops a hyperviscosity syndrome. At this time I see no evidence of that he has no neurological symptoms or cardiovascular symptoms to suggest that.  However I have educated him about it I talked to him about the possible need for a plasma exchange should that happens.  4.  Neutropenia prophylaxis: I added Neulasta to each cycle. 5. Nausea/vomiting prophylaxis: He has Compazine at home. 6. followup: 06/06/13 for cycle 5.    SHADAD,FIRAS 9/4/20148:35 AM

## 2013-05-09 NOTE — Telephone Encounter (Signed)
Per staff message and POF I have scheduled appts.  JMW  

## 2013-05-09 NOTE — Progress Notes (Signed)
1308 Dr. Clelia Croft aware of ANC 0.8 today. Per MD, proceed with treatment as planned. Clayborn Heron, RN

## 2013-05-10 ENCOUNTER — Ambulatory Visit (HOSPITAL_BASED_OUTPATIENT_CLINIC_OR_DEPARTMENT_OTHER): Payer: Medicare Other

## 2013-05-10 VITALS — BP 128/82 | HR 89 | Temp 97.0°F | Resp 18

## 2013-05-10 DIAGNOSIS — D472 Monoclonal gammopathy: Secondary | ICD-10-CM

## 2013-05-10 DIAGNOSIS — Z5111 Encounter for antineoplastic chemotherapy: Secondary | ICD-10-CM | POA: Diagnosis not present

## 2013-05-10 MED ORDER — SODIUM CHLORIDE 0.9 % IV SOLN
90.0000 mg/m2 | Freq: Once | INTRAVENOUS | Status: AC
  Administered 2013-05-10: 240 mg via INTRAVENOUS
  Filled 2013-05-10: qty 48

## 2013-05-10 MED ORDER — DEXAMETHASONE SODIUM PHOSPHATE 10 MG/ML IJ SOLN
10.0000 mg | Freq: Once | INTRAMUSCULAR | Status: AC
  Administered 2013-05-10: 10 mg via INTRAVENOUS

## 2013-05-10 MED ORDER — ONDANSETRON 8 MG/NS 50 ML IVPB
INTRAVENOUS | Status: AC
  Filled 2013-05-10: qty 8

## 2013-05-10 MED ORDER — SODIUM CHLORIDE 0.9 % IV SOLN
Freq: Once | INTRAVENOUS | Status: AC
  Administered 2013-05-10: 12:00:00 via INTRAVENOUS

## 2013-05-10 MED ORDER — ONDANSETRON 8 MG/50ML IVPB (CHCC)
8.0000 mg | Freq: Once | INTRAVENOUS | Status: AC
  Administered 2013-05-10: 8 mg via INTRAVENOUS

## 2013-05-10 MED ORDER — DEXAMETHASONE SODIUM PHOSPHATE 10 MG/ML IJ SOLN
INTRAMUSCULAR | Status: AC
  Filled 2013-05-10: qty 1

## 2013-05-10 NOTE — Patient Instructions (Addendum)
[facility] Discharge Instructions for Patients Receiving Chemotherapy  Today you received the following chemotherapy agents:  Treanda  To help prevent nausea and vomiting after your treatment, we encourage you to take your nausea medication as ordered per MD.   If you develop nausea and vomiting that is not controlled by your nausea medication, call the clinic.   BELOW ARE SYMPTOMS THAT SHOULD BE REPORTED IMMEDIATELY:  *FEVER GREATER THAN 100.5 F  *CHILLS WITH OR WITHOUT FEVER  NAUSEA AND VOMITING THAT IS NOT CONTROLLED WITH YOUR NAUSEA MEDICATION  *UNUSUAL SHORTNESS OF BREATH  *UNUSUAL BRUISING OR BLEEDING  TENDERNESS IN MOUTH AND THROAT WITH OR WITHOUT PRESENCE OF ULCERS  *URINARY PROBLEMS  *BOWEL PROBLEMS  UNUSUAL RASH Items with * indicate a potential emergency and should be followed up as soon as possible.  Feel free to call the clinic you have any questions or concerns. The clinic phone number is [phone].    

## 2013-05-11 ENCOUNTER — Ambulatory Visit (HOSPITAL_BASED_OUTPATIENT_CLINIC_OR_DEPARTMENT_OTHER): Payer: Medicare Other

## 2013-05-11 VITALS — BP 163/72 | HR 76 | Temp 97.9°F | Resp 20

## 2013-05-11 DIAGNOSIS — D472 Monoclonal gammopathy: Secondary | ICD-10-CM

## 2013-05-11 MED ORDER — PEGFILGRASTIM INJECTION 6 MG/0.6ML
6.0000 mg | Freq: Once | SUBCUTANEOUS | Status: AC
  Administered 2013-05-11: 6 mg via SUBCUTANEOUS

## 2013-05-17 ENCOUNTER — Encounter: Payer: Self-pay | Admitting: Endocrinology

## 2013-05-17 ENCOUNTER — Ambulatory Visit (INDEPENDENT_AMBULATORY_CARE_PROVIDER_SITE_OTHER): Payer: Medicare Other | Admitting: Endocrinology

## 2013-05-17 VITALS — BP 122/62 | HR 90 | Temp 98.1°F | Resp 12 | Ht 73.0 in | Wt 311.0 lb

## 2013-05-17 DIAGNOSIS — IMO0001 Reserved for inherently not codable concepts without codable children: Secondary | ICD-10-CM

## 2013-05-17 LAB — URINALYSIS, ROUTINE W REFLEX MICROSCOPIC
Bilirubin Urine: NEGATIVE
Hgb urine dipstick: NEGATIVE
Ketones, ur: NEGATIVE
Nitrite: NEGATIVE
Total Protein, Urine: NEGATIVE
pH: 5.5 (ref 5.0–8.0)

## 2013-05-17 NOTE — Progress Notes (Signed)
Patient ID: Edward Clarke, male   DOB: 23-Nov-1965, 47 y.o.   MRN: [id number]  Reason for Appointment: Diabetes follow-up   History of Present Illness   Diagnosis: Type 2 diabetes mellitus, date of diagnosis: 2005.   He has resumed his NovoLog insulin at mealtimes since his last visit and he thinks his blood sugars are fairly good. However checking blood sugars mostly before meals and occasionally after supper In the morning he takes his Lantus at variable times depending on when he is eating breakfast If his blood sugar is fairly good he may not take NovoLog in the morning No hypoglycemia recently  Complications: none.  Difficulties with medication adherence: Frequent, financial limitations,  Proper timing of medications in relation to meals: Yes.  Monitors blood glucose: Once a day.  Glucometer: One Touch.  Blood Glucose readings: am 100-110 , acs usually pc 140-150and did not bring monitor .   INSULIN: Lantus 32 units in the morning before breakfast, NovoLog before meals, 5 units in the morning usually and 20 at supper  Hypoglycemia frequency: Rarely now.  Meals: Usually 2 meals a day (supper 5-9 pm) and low fat, mostly vegetables.  Carbohydrate intake: variable.  Food preferences: usually low fat.  Physical activity: exercise: none  Retinal exam: Most recent: 2014.   The  HbgA1c results this year have been 7.9, previously 8.3 in 4/14, last year as high as 11.6.  Lab Results  Component Value Date   HGBA1C 7.9* 03/12/2013     HYPERTENSION:  has had long-standing hypertension and this has been recently this has been easier to control. Currently off lisinopril with good control  HYPERLIPIDEMIA:         The lipid abnormality consists of elevated LDL treated with simvastatin .    No visits with results within 1 Week(s) from this visit. Latest known visit with results is:  Appointment on 05/09/2013  Component Date Value Range Status  . Sodium 05/09/2013 138  136 - 145 mEq/L  Final  . Potassium 05/09/2013 4.8  3.5 - 5.1 mEq/L Final  . Chloride 05/09/2013 109  98 - 109 mEq/L Final  . CO2 05/09/2013 21* 22 - 29 mEq/L Final  . Glucose 05/09/2013 160* 70 - 140 mg/dl Final  . BUN 40/98/1191 17.7  7.0 - 26.0 mg/dL Final  . Creatinine 47/82/9562 1.1  0.7 - 1.3 mg/dL Final  . Total Bilirubin 05/09/2013 0.28  0.20 - 1.20 mg/dL Final  . Alkaline Phosphatase 05/09/2013 70  40 - 150 U/L Final  . AST 05/09/2013 19  5 - 34 U/L Final  . ALT 05/09/2013 18  0 - 55 U/L Final  . Total Protein 05/09/2013 8.0  6.4 - 8.3 g/dL Final  . Albumin 13/04/6577 4.0  3.5 - 5.0 g/dL Final  . Calcium 46/96/2952 9.9  8.4 - 10.4 mg/dL Final  . WBC 84/13/2440 1.8* 4.0 - 10.3 10e3/uL Final  . NEUT# 05/09/2013 0.8* 1.5 - 6.5 10e3/uL Final  . HGB 05/09/2013 11.2* 13.0 - 17.1 g/dL Final  . HCT 07/02/2535 33.0* 38.4 - 49.9 % Final  . Platelets 05/09/2013 190  140 - 400 10e3/uL Final  . MCV 05/09/2013 93.2  79.3 - 98.0 fL Final  . MCH 05/09/2013 31.6  27.2 - 33.4 pg Final  . MCHC 05/09/2013 33.9  32.0 - 36.0 g/dL Final  . RBC 64/40/3474 3.54* 4.20 - 5.82 10e6/uL Final  . RDW 05/09/2013 15.0* 11.0 - 14.6 % Final  . lymph# 05/09/2013 0.4* 0.9 - 3.3 10e3/uL Final  .  MONO# 05/09/2013 0.4  0.1 - 0.9 10e3/uL Final  . Eosinophils Absolute 05/09/2013 0.1  0.0 - 0.5 10e3/uL Final  . Basophils Absolute 05/09/2013 0.0  0.0 - 0.1 10e3/uL Final  . NEUT% 05/09/2013 45.0  39.0 - 75.0 % Final  . LYMPH% 05/09/2013 24.4  14.0 - 49.0 % Final  . MONO% 05/09/2013 23.3* 0.0 - 14.0 % Final  . EOS% 05/09/2013 5.6  0.0 - 7.0 % Final  . BASO% 05/09/2013 1.7  0.0 - 2.0 % Final  . nRBC 05/09/2013 0  0 - 0 % Final      Medication List       This list is accurate as of: 05/17/13  4:04 PM.  Always use your most recent med list.               aspirin EC 81 MG tablet  Take 81 mg by mouth every morning.     [facility] DRY EYE RELIEF 0.2-0.2-1 % Soln  Generic drug:  Glycerin-Hypromellose-PEG 400  Place 1-2 drops into  both eyes daily as needed (For dry eyes.).     ibuprofen 200 MG tablet  Commonly known as:  ADVIL,MOTRIN  Take 400-800 mg by mouth every 8 (eight) hours as needed for headache.     LANTUS SOLOSTAR 100 UNIT/ML Sopn  Generic drug:  Insulin Glargine  Inject 32 Units into the skin every morning.     lisinopril 10 MG tablet  Commonly known as:  PRINIVIL,ZESTRIL  Take 10 mg by mouth every morning.     metFORMIN 1000 MG tablet  Commonly known as:  GLUCOPHAGE  Take 1,000 mg by mouth 2 (two) times daily.     multivitamin with minerals Tabs tablet  Take 1 tablet by mouth every morning.     NOVOLOG FLEXPEN 100 UNIT/ML Sopn FlexPen  Generic drug:  insulin aspart  Inject 5-20 Units into the skin See admin instructions. He uses a sliding scale.     prochlorperazine 10 MG tablet  Commonly known as:  COMPAZINE  Take 1 tablet (10 mg total) by mouth every 6 (six) hours as needed.     RITUXAN IV  Inject into the vein.     simvastatin 40 MG tablet  Commonly known as:  ZOCOR  Take 40 mg by mouth every morning.     tobramycin 0.3 % ophthalmic solution  Commonly known as:  TOBREX  Place 1 drop into both eyes See admin instructions. He uses for 2 days prior to eye procedures.     TREANDA IV  Inject 240 mg into the vein. 2 times per month        Allergies:  Allergies  Allergen Reactions  . Benazepril Other (See Comments)    Reaction unknown - may have been headaches    Past Medical History  Diagnosis Date  . Monoclonal paraproteinemia   . Diabetes mellitus   . Hypertension   . Obesity   . Hyperlipidemia     Past Surgical History  Procedure Laterality Date  . Appendectomy    . Eye surgery    . Tonsillectomy      No family history on file.  Social History:  reports that he has quit smoking. His smoking use included Cigarettes. He smoked 0.00 packs per day. He does not have any smokeless tobacco history on file. He reports that he drinks about 1.2 ounces of alcohol per  week. He reports that he uses illicit drugs (Marijuana) about 7 times per week.  Review of  Systems - Cardiovascular ROS: positive for - hypertension Started chemotherapy for myeloma with Bendamustine and Rituxan on 02/14/13.   Hyperlipidemia: He ran out of his Zocor, advised him to refill his   Examination:   BP 122/62  Pulse 90  Temp(Src) 98.1 F (36.7 C)  Resp 12  Ht 6\' 1"  (1.854 m)  Wt 311 lb (141.069 kg)  BMI 41.04 kg/m2  SpO2 96%  Body mass index is 41.04 kg/(m^2).   Assesment/plan:   1. Diabetes type 2, uncontrolled - 250.02  The patient's diabetes control appears to be reasonably well controlled and he is still taking relatively small doses of insulin, and now may be benefiting from resuming metformin also He is taking his morning Lantus at variable times and blood sugar appears to be higher in the lab late morning. He was advised to take his Lantus in the morning on waking up regardless of breakfast time Take NovoLog at breakfast if eating any carbohydrate even if blood sugar normal Also needs to take more readings after meals  2. Currently not on antihypertensives and will continue to monitor   [facility] 05/17/2013, 4:04 PM

## 2013-05-17 NOTE — Patient Instructions (Addendum)
Lantus early am  Please check blood sugars at least half the time about 2 hours after any meal and as directed on waking up. Please bring blood sugar monitor to each visit

## 2013-05-27 DIAGNOSIS — H348192 Central retinal vein occlusion, unspecified eye, stable: Secondary | ICD-10-CM | POA: Diagnosis not present

## 2013-05-27 DIAGNOSIS — E11349 Type 2 diabetes mellitus with severe nonproliferative diabetic retinopathy without macular edema: Secondary | ICD-10-CM | POA: Diagnosis not present

## 2013-05-27 DIAGNOSIS — H3320 Serous retinal detachment, unspecified eye: Secondary | ICD-10-CM | POA: Diagnosis not present

## 2013-05-27 DIAGNOSIS — H3581 Retinal edema: Secondary | ICD-10-CM | POA: Diagnosis not present

## 2013-06-04 ENCOUNTER — Other Ambulatory Visit: Payer: Self-pay | Admitting: Oncology

## 2013-06-06 ENCOUNTER — Ambulatory Visit (HOSPITAL_BASED_OUTPATIENT_CLINIC_OR_DEPARTMENT_OTHER): Payer: Medicare Other

## 2013-06-06 ENCOUNTER — Other Ambulatory Visit (HOSPITAL_BASED_OUTPATIENT_CLINIC_OR_DEPARTMENT_OTHER): Payer: Medicare Other | Admitting: Lab

## 2013-06-06 ENCOUNTER — Ambulatory Visit (HOSPITAL_BASED_OUTPATIENT_CLINIC_OR_DEPARTMENT_OTHER): Payer: Medicare Other | Admitting: Oncology

## 2013-06-06 VITALS — BP 146/94 | HR 66 | Temp 98.5°F | Resp 18

## 2013-06-06 VITALS — BP 150/90 | HR 69 | Temp 97.4°F | Resp 18 | Ht 73.0 in | Wt 311.7 lb

## 2013-06-06 DIAGNOSIS — D472 Monoclonal gammopathy: Secondary | ICD-10-CM

## 2013-06-06 DIAGNOSIS — Z5111 Encounter for antineoplastic chemotherapy: Secondary | ICD-10-CM

## 2013-06-06 DIAGNOSIS — Z5112 Encounter for antineoplastic immunotherapy: Secondary | ICD-10-CM

## 2013-06-06 DIAGNOSIS — D649 Anemia, unspecified: Secondary | ICD-10-CM | POA: Diagnosis not present

## 2013-06-06 LAB — CBC WITH DIFFERENTIAL/PLATELET
BASO%: 1.7 % (ref 0.0–2.0)
Basophils Absolute: 0 10*3/uL (ref 0.0–0.1)
Eosinophils Absolute: 0 10*3/uL (ref 0.0–0.5)
HCT: 35.8 % — ABNORMAL LOW (ref 38.4–49.9)
HGB: 12.3 g/dL — ABNORMAL LOW (ref 13.0–17.1)
LYMPH%: 32.2 % (ref 14.0–49.0)
MONO#: 0.4 10*3/uL (ref 0.1–0.9)
NEUT#: 0.7 10*3/uL — ABNORMAL LOW (ref 1.5–6.5)
NEUT%: 40.8 % (ref 39.0–75.0)
Platelets: 169 10*3/uL (ref 140–400)
WBC: 1.7 10*3/uL — ABNORMAL LOW (ref 4.0–10.3)
lymph#: 0.6 10*3/uL — ABNORMAL LOW (ref 0.9–3.3)

## 2013-06-06 LAB — COMPREHENSIVE METABOLIC PANEL (CC13)
AST: 20 U/L (ref 5–34)
Albumin: 4 g/dL (ref 3.5–5.0)
BUN: 9 mg/dL (ref 7.0–26.0)
CO2: 23 mEq/L (ref 22–29)
Calcium: 9.8 mg/dL (ref 8.4–10.4)
Chloride: 105 mEq/L (ref 98–109)
Creatinine: 1.1 mg/dL (ref 0.7–1.3)
Glucose: 175 mg/dl — ABNORMAL HIGH (ref 70–140)
Potassium: 4.5 mEq/L (ref 3.5–5.1)

## 2013-06-06 MED ORDER — SODIUM CHLORIDE 0.9 % IV SOLN
90.0000 mg/m2 | Freq: Once | INTRAVENOUS | Status: AC
  Administered 2013-06-06: 240 mg via INTRAVENOUS
  Filled 2013-06-06: qty 48

## 2013-06-06 MED ORDER — DEXAMETHASONE SODIUM PHOSPHATE 10 MG/ML IJ SOLN
INTRAMUSCULAR | Status: AC
  Filled 2013-06-06: qty 1

## 2013-06-06 MED ORDER — DEXAMETHASONE SODIUM PHOSPHATE 10 MG/ML IJ SOLN
10.0000 mg | Freq: Once | INTRAMUSCULAR | Status: AC
  Administered 2013-06-06: 10 mg via INTRAVENOUS

## 2013-06-06 MED ORDER — DIPHENHYDRAMINE HCL 25 MG PO CAPS
ORAL_CAPSULE | ORAL | Status: AC
  Filled 2013-06-06: qty 2

## 2013-06-06 MED ORDER — ACETAMINOPHEN 325 MG PO TABS
ORAL_TABLET | ORAL | Status: AC
  Filled 2013-06-06: qty 2

## 2013-06-06 MED ORDER — ONDANSETRON 8 MG/NS 50 ML IVPB
INTRAVENOUS | Status: AC
  Filled 2013-06-06: qty 8

## 2013-06-06 MED ORDER — DIPHENHYDRAMINE HCL 25 MG PO CAPS
50.0000 mg | ORAL_CAPSULE | Freq: Once | ORAL | Status: AC
  Administered 2013-06-06: 50 mg via ORAL

## 2013-06-06 MED ORDER — ONDANSETRON 8 MG/50ML IVPB (CHCC)
8.0000 mg | Freq: Once | INTRAVENOUS | Status: AC
  Administered 2013-06-06: 8 mg via INTRAVENOUS

## 2013-06-06 MED ORDER — SODIUM CHLORIDE 0.9 % IV SOLN
Freq: Once | INTRAVENOUS | Status: AC
Start: 2013-06-06 — End: 2013-06-06
  Administered 2013-06-06: 11:00:00 via INTRAVENOUS

## 2013-06-06 MED ORDER — SODIUM CHLORIDE 0.9 % IV SOLN
375.0000 mg/m2 | Freq: Once | INTRAVENOUS | Status: AC
  Administered 2013-06-06: 1000 mg via INTRAVENOUS
  Filled 2013-06-06: qty 100

## 2013-06-06 MED ORDER — ACETAMINOPHEN 325 MG PO TABS
650.0000 mg | ORAL_TABLET | Freq: Once | ORAL | Status: AC
  Administered 2013-06-06: 650 mg via ORAL

## 2013-06-06 NOTE — Progress Notes (Signed)
Hematology and Oncology Follow Up Visit  Edward Clarke [id number] 1966/04/20 47 y.o. 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  He had presented with IgM about 3 g/dL and M-spike about 2 g/dL.  His workup that includes imaging studies, CT scan of the chest, abdomen and pelvis and skeletal survey had been really unrevealing. Bone marrow biopsy confirmed the diagnosis.   Current therapy: Started chemotherapy with Bendamustine and Rituxan on 02/14/13. He is here for cycle 5.   Interim History:  [name] presents today for a follow-up visit. He continues to tolerate this chemotherapy without complications. States his energy level is much better and has continued to improve. He reports no dyspnea on exertion. He reports no side effects due to chemotherapy. Constipation is now resolved.  He has not reported any major appetite changes.  Denies chest pain. No rashes.  He has not reported any alternation in his mental status. He did not report any neurological symptoms.  Overall, performance status and activity level remained reasonable and continue to improve. He is not reporting any fevers chills or sweats. He is reporting grade 1 arthralgia and myalgias.  Medications: I have reviewed the patient's current medications.  Current Outpatient Prescriptions  Medication Sig Dispense Refill  . aspirin EC 81 MG tablet Take 81 mg by mouth every morning.      . Bendamustine HCl (TREANDA IV) Inject 240 mg into the vein. 2 times per month      . Glycerin-Hypromellose-PEG 400 ([facility] DRY EYE RELIEF) 0.2-0.2-1 % SOLN Place 1-2 drops into both eyes daily as needed (For dry eyes.).      [name] ibuprofen (ADVIL,MOTRIN) 200 MG tablet Take 400-800 mg by mouth every 8 (eight) hours as needed for headache.      . insulin aspart (NOVOLOG FLEXPEN) 100 unit/mL SOLN  FlexPen Inject 5-20 Units into the skin See admin instructions. He uses a sliding scale.      . Insulin Glargine (LANTUS SOLOSTAR) 100 UNIT/ML SOPN Inject 32 Units into the skin every morning.       [name] lisinopril (PRINIVIL,ZESTRIL) 10 MG tablet Take 10 mg by mouth every morning.       . metFORMIN (GLUCOPHAGE) 1000 MG tablet Take 1,000 mg by mouth 2 (two) times daily.       . Multiple Vitamin (MULTIVITAMIN WITH MINERALS) TABS Take 1 tablet by mouth every morning.      . RiTUXimab (RITUXAN IV) Inject into the vein.      . simvastatin (ZOCOR) 40 MG tablet Take 40 mg by mouth every morning.       . tobramycin (TOBREX) 0.3 % ophthalmic solution Place 1 drop into both eyes See admin instructions. He uses for 2 days prior to eye procedures.       No current facility-administered medications for this visit.     Allergies:  Allergies  Allergen Reactions  . Benazepril Other (See Comments)    Reaction unknown - may have been headaches    Past Medical History, Surgical history, Social history, and Family History were reviewed and updated.  Review of Systems:  Remaining ROS negative.  Physical Exam: Blood pressure 150/90, pulse 69, temperature 97.4 F (36.3 C), temperature source Oral, resp. rate 18, height 6\' 1"  (1.854 m), weight 311 lb 11.2 oz (141.386 kg). ECOG: 1 General appearance: alert Head: Normocephalic, without obvious abnormality, atraumatic Neck:  no adenopathy, no carotid bruit, no JVD, supple, symmetrical, trachea midline and thyroid not enlarged, symmetric, no tenderness/mass/nodules Lymph nodes: Cervical, supraclavicular, and axillary nodes normal. Heart:regular rate and rhythm, S1, S2 normal, no murmur, click, rub or gallop Lung:chest clear, no wheezing, rales, normal symmetric air entry Abdomen: soft, non-tender, without masses or organomegaly EXT:no erythema, induration, or nodules   Lab Results: Lab Results  Component Value Date   WBC 1.7* 06/06/2013   HGB 12.3*  06/06/2013   HCT 35.8* 06/06/2013   MCV 90.4 06/06/2013   PLT 169 06/06/2013     Chemistry      Component Value Date/Time   NA 138 05/09/2013 0759   NA 136 03/12/2013 1025   K 4.8 05/09/2013 0759   K 4.4 03/12/2013 1025   CL 102 03/12/2013 1025   CL 101 02/14/2013 0758   CO2 21* 05/09/2013 0759   CO2 28 03/12/2013 1025   BUN 17.7 05/09/2013 0759   BUN 14 03/12/2013 1025   CREATININE 1.1 05/09/2013 0759   CREATININE 1.3 03/12/2013 1025      Component Value Date/Time   CALCIUM 9.9 05/09/2013 0759   CALCIUM 10.3 03/12/2013 1025   ALKPHOS 70 05/09/2013 0759   ALKPHOS 51 11/24/2010 1320   AST 19 05/09/2013 0759   AST 18 11/24/2010 1320   ALT 18 05/09/2013 0759   ALT 17 11/24/2010 1320   BILITOT 0.28 05/09/2013 0759   BILITOT 0.4 11/24/2010 1320       Impression and Plan:  This is a pleasant 47 year old gentleman with the following issues: 1. IgM monoclonal paraproteinemia. This is likely due to  lymphoproliferative disorder such as Waldenstrm's or lymphoplasmacytic lymphoma.  He is status post four cycles of bendamustine and Rituxan. He has grade 1 fatigue and grade 1 nausea and grade 1 myalgias. Overall he tolerated the chemotherapy well with an improvement in his performance status. He will Proceed with cycle #5 without any delay. 2. Anemia: likely related to #1 and recent chemotherapy. He has no active bleeding. No transfusion is indicated at this point in time. Hgb is improving rapidly.  3. Hyperviscosity syndrome: I discussed as a potential dangers complication from an IgM paraproteinemia I also discussed with him the possible need for a plasma exchange in case he develops a hyperviscosity syndrome. At this time I see no evidence of that he has no neurological symptoms or cardiovascular symptoms to suggest that. However I have educated him about it I talked to him about the possible need for a plasma exchange should that happens. His IgM is dropping rapidly which makes this less likely possibility. 4.  Neutropenia  prophylaxis: I added Neulasta to each cycle. 5. Nausea/vomiting prophylaxis: He has Compazine at home. 6. followup: 07/04/13 for cycle 6.    Edward Clarke 10/2/20149:20 AM

## 2013-06-06 NOTE — Patient Instructions (Addendum)
[facility] Discharge Instructions for Patients Receiving Chemotherapy  Today you received the following chemotherapy agents Rituxan/Treanda.  To help prevent nausea and vomiting after your treatment, we encourage you to take your nausea medication as prescribed.   If you develop nausea and vomiting that is not controlled by your nausea medication, call the clinic.   BELOW ARE SYMPTOMS THAT SHOULD BE REPORTED IMMEDIATELY:  *FEVER GREATER THAN 100.5 F  *CHILLS WITH OR WITHOUT FEVER  NAUSEA AND VOMITING THAT IS NOT CONTROLLED WITH YOUR NAUSEA MEDICATION  *UNUSUAL SHORTNESS OF BREATH  *UNUSUAL BRUISING OR BLEEDING  TENDERNESS IN MOUTH AND THROAT WITH OR WITHOUT PRESENCE OF ULCERS  *URINARY PROBLEMS  *BOWEL PROBLEMS  UNUSUAL RASH Items with * indicate a potential emergency and should be followed up as soon as possible.  Feel free to call the clinic you have any questions or concerns. The clinic phone number is [phone].    

## 2013-06-07 ENCOUNTER — Ambulatory Visit (HOSPITAL_BASED_OUTPATIENT_CLINIC_OR_DEPARTMENT_OTHER): Payer: Medicare Other

## 2013-06-07 VITALS — BP 160/97 | HR 72 | Temp 98.3°F | Resp 20

## 2013-06-07 DIAGNOSIS — D472 Monoclonal gammopathy: Secondary | ICD-10-CM

## 2013-06-07 DIAGNOSIS — Z5111 Encounter for antineoplastic chemotherapy: Secondary | ICD-10-CM

## 2013-06-07 MED ORDER — SODIUM CHLORIDE 0.9 % IV SOLN
Freq: Once | INTRAVENOUS | Status: AC
  Administered 2013-06-07: 15:00:00 via INTRAVENOUS

## 2013-06-07 MED ORDER — ONDANSETRON 8 MG/NS 50 ML IVPB
INTRAVENOUS | Status: AC
  Filled 2013-06-07: qty 8

## 2013-06-07 MED ORDER — SODIUM CHLORIDE 0.9 % IV SOLN
90.0000 mg/m2 | Freq: Once | INTRAVENOUS | Status: AC
  Administered 2013-06-07: 240 mg via INTRAVENOUS
  Filled 2013-06-07: qty 48

## 2013-06-07 MED ORDER — ONDANSETRON 8 MG/50ML IVPB (CHCC)
8.0000 mg | Freq: Once | INTRAVENOUS | Status: AC
  Administered 2013-06-07: 8 mg via INTRAVENOUS

## 2013-06-07 MED ORDER — DEXAMETHASONE SODIUM PHOSPHATE 10 MG/ML IJ SOLN
INTRAMUSCULAR | Status: AC
  Filled 2013-06-07: qty 1

## 2013-06-07 MED ORDER — SODIUM CHLORIDE 0.9 % IJ SOLN
10.0000 mL | INTRAMUSCULAR | Status: DC | PRN
  Filled 2013-06-07: qty 10

## 2013-06-07 MED ORDER — HEPARIN SOD (PORK) LOCK FLUSH 100 UNIT/ML IV SOLN
500.0000 [IU] | Freq: Once | INTRAVENOUS | Status: DC | PRN
Start: 2013-06-07 — End: 2013-06-07
  Filled 2013-06-07: qty 5

## 2013-06-07 MED ORDER — DEXAMETHASONE SODIUM PHOSPHATE 10 MG/ML IJ SOLN
10.0000 mg | Freq: Once | INTRAMUSCULAR | Status: AC
  Administered 2013-06-07: 10 mg via INTRAVENOUS

## 2013-06-08 ENCOUNTER — Ambulatory Visit (HOSPITAL_BASED_OUTPATIENT_CLINIC_OR_DEPARTMENT_OTHER): Payer: Medicare Other

## 2013-06-08 VITALS — BP 161/94 | HR 65 | Temp 98.3°F

## 2013-06-08 DIAGNOSIS — D472 Monoclonal gammopathy: Secondary | ICD-10-CM | POA: Diagnosis not present

## 2013-06-08 DIAGNOSIS — Z5189 Encounter for other specified aftercare: Secondary | ICD-10-CM

## 2013-06-08 MED ORDER — PEGFILGRASTIM INJECTION 6 MG/0.6ML
6.0000 mg | Freq: Once | SUBCUTANEOUS | Status: AC
  Administered 2013-06-08: 6 mg via SUBCUTANEOUS

## 2013-06-08 NOTE — Patient Instructions (Signed)

## 2013-06-10 LAB — SPEP & IFE WITH QIG
Alpha-1-Globulin: 8 % — ABNORMAL HIGH (ref 2.9–4.9)
Alpha-2-Globulin: 8.7 % (ref 7.1–11.8)
Beta 2: 3.4 % (ref 3.2–6.5)
Gamma Globulin: 15 % (ref 11.1–18.8)
IgG (Immunoglobin G), Serum: 844 mg/dL (ref 650–1600)
IgM, Serum: 661 mg/dL — ABNORMAL HIGH (ref 41–251)
M-Spike, %: 0.53 g/dL

## 2013-07-03 ENCOUNTER — Other Ambulatory Visit: Payer: Self-pay | Admitting: Oncology

## 2013-07-04 ENCOUNTER — Other Ambulatory Visit (HOSPITAL_BASED_OUTPATIENT_CLINIC_OR_DEPARTMENT_OTHER): Payer: Medicare Other | Admitting: Lab

## 2013-07-04 ENCOUNTER — Ambulatory Visit (HOSPITAL_BASED_OUTPATIENT_CLINIC_OR_DEPARTMENT_OTHER): Payer: Medicare Other | Admitting: Oncology

## 2013-07-04 ENCOUNTER — Telehealth: Payer: Self-pay | Admitting: Oncology

## 2013-07-04 ENCOUNTER — Ambulatory Visit (HOSPITAL_BASED_OUTPATIENT_CLINIC_OR_DEPARTMENT_OTHER): Payer: Medicare Other

## 2013-07-04 VITALS — BP 161/96 | HR 63 | Temp 97.4°F | Resp 20 | Ht 73.0 in | Wt 327.3 lb

## 2013-07-04 VITALS — BP 142/97 | HR 60 | Temp 98.0°F | Resp 20

## 2013-07-04 DIAGNOSIS — E119 Type 2 diabetes mellitus without complications: Secondary | ICD-10-CM

## 2013-07-04 DIAGNOSIS — Z5111 Encounter for antineoplastic chemotherapy: Secondary | ICD-10-CM | POA: Diagnosis not present

## 2013-07-04 DIAGNOSIS — D472 Monoclonal gammopathy: Secondary | ICD-10-CM

## 2013-07-04 DIAGNOSIS — Z5112 Encounter for antineoplastic immunotherapy: Secondary | ICD-10-CM | POA: Diagnosis not present

## 2013-07-04 DIAGNOSIS — D649 Anemia, unspecified: Secondary | ICD-10-CM

## 2013-07-04 DIAGNOSIS — C880 Waldenstrom macroglobulinemia: Secondary | ICD-10-CM

## 2013-07-04 LAB — CBC WITH DIFFERENTIAL/PLATELET
BASO%: 1 % (ref 0.0–2.0)
Basophils Absolute: 0 10*3/uL (ref 0.0–0.1)
Eosinophils Absolute: 0.2 10*3/uL (ref 0.0–0.5)
HGB: 12.7 g/dL — ABNORMAL LOW (ref 13.0–17.1)
MCH: 31 pg (ref 27.2–33.4)
MCV: 89.3 fL (ref 79.3–98.0)
MONO%: 23.8 % — ABNORMAL HIGH (ref 0.0–14.0)
NEUT#: 1.7 10*3/uL (ref 1.5–6.5)
Platelets: 191 10*3/uL (ref 140–400)
RDW: 13.7 % (ref 11.0–14.6)
nRBC: 0 % (ref 0–0)

## 2013-07-04 LAB — COMPREHENSIVE METABOLIC PANEL (CC13)
AST: 21 U/L (ref 5–34)
Albumin: 3.9 g/dL (ref 3.5–5.0)
Anion Gap: 10 mEq/L (ref 3–11)
CO2: 23 mEq/L (ref 22–29)
Calcium: 10.1 mg/dL (ref 8.4–10.4)
Glucose: 150 mg/dl — ABNORMAL HIGH (ref 70–140)
Sodium: 139 mEq/L (ref 136–145)
Total Bilirubin: 0.57 mg/dL (ref 0.20–1.20)
Total Protein: 7.3 g/dL (ref 6.4–8.3)

## 2013-07-04 MED ORDER — DIPHENHYDRAMINE HCL 25 MG PO CAPS
50.0000 mg | ORAL_CAPSULE | Freq: Once | ORAL | Status: AC
  Administered 2013-07-04: 50 mg via ORAL

## 2013-07-04 MED ORDER — SODIUM CHLORIDE 0.9 % IV SOLN
90.0000 mg/m2 | Freq: Once | INTRAVENOUS | Status: AC
  Administered 2013-07-04: 240 mg via INTRAVENOUS
  Filled 2013-07-04: qty 48

## 2013-07-04 MED ORDER — ACETAMINOPHEN 325 MG PO TABS
ORAL_TABLET | ORAL | Status: AC
  Filled 2013-07-04: qty 2

## 2013-07-04 MED ORDER — ONDANSETRON 8 MG/50ML IVPB (CHCC)
8.0000 mg | Freq: Once | INTRAVENOUS | Status: AC
  Administered 2013-07-04: 8 mg via INTRAVENOUS

## 2013-07-04 MED ORDER — ACETAMINOPHEN 325 MG PO TABS
650.0000 mg | ORAL_TABLET | Freq: Once | ORAL | Status: AC
  Administered 2013-07-04: 650 mg via ORAL

## 2013-07-04 MED ORDER — DIPHENHYDRAMINE HCL 25 MG PO CAPS
ORAL_CAPSULE | ORAL | Status: AC
  Filled 2013-07-04: qty 2

## 2013-07-04 MED ORDER — DEXAMETHASONE SODIUM PHOSPHATE 10 MG/ML IJ SOLN
INTRAMUSCULAR | Status: AC
  Filled 2013-07-04: qty 1

## 2013-07-04 MED ORDER — ONDANSETRON 8 MG/NS 50 ML IVPB
INTRAVENOUS | Status: AC
  Filled 2013-07-04: qty 8

## 2013-07-04 MED ORDER — SODIUM CHLORIDE 0.9 % IV SOLN
375.0000 mg/m2 | Freq: Once | INTRAVENOUS | Status: AC
  Administered 2013-07-04: 1000 mg via INTRAVENOUS
  Filled 2013-07-04: qty 100

## 2013-07-04 MED ORDER — DEXAMETHASONE SODIUM PHOSPHATE 10 MG/ML IJ SOLN
10.0000 mg | Freq: Once | INTRAMUSCULAR | Status: AC
  Administered 2013-07-04: 10 mg via INTRAVENOUS

## 2013-07-04 MED ORDER — SODIUM CHLORIDE 0.9 % IV SOLN
Freq: Once | INTRAVENOUS | Status: AC
  Administered 2013-07-04: 09:00:00 via INTRAVENOUS

## 2013-07-04 NOTE — Telephone Encounter (Signed)
Gave pt appt for lab,md chemo for November and December 2014

## 2013-07-04 NOTE — Patient Instructions (Signed)
[facility] Discharge Instructions for Patients Receiving Chemotherapy  Today you received the following chemotherapy agents Treanda and Rituxan.  To help prevent nausea and vomiting after your treatment, we encourage you to take your nausea medication as prescribed.   If you develop nausea and vomiting that is not controlled by your nausea medication, call the clinic.   BELOW ARE SYMPTOMS THAT SHOULD BE REPORTED IMMEDIATELY:  *FEVER GREATER THAN 100.5 F  *CHILLS WITH OR WITHOUT FEVER  NAUSEA AND VOMITING THAT IS NOT CONTROLLED WITH YOUR NAUSEA MEDICATION  *UNUSUAL SHORTNESS OF BREATH  *UNUSUAL BRUISING OR BLEEDING  TENDERNESS IN MOUTH AND THROAT WITH OR WITHOUT PRESENCE OF ULCERS  *URINARY PROBLEMS  *BOWEL PROBLEMS  UNUSUAL RASH Items with * indicate a potential emergency and should be followed up as soon as possible.  Feel free to call the clinic you have any questions or concerns. The clinic phone number is [phone].

## 2013-07-04 NOTE — Progress Notes (Signed)
Hematology and Oncology Follow Up Visit  Edward Clarke [id number] [birth year] 47 y.o. 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  He had presented with IgM about 3 g/dL and M-spike about 2 g/dL.  His workup that includes imaging studies, CT scan of the chest, abdomen and pelvis and skeletal survey had been really unrevealing. Bone marrow biopsy confirmed the diagnosis.   Current therapy: Started chemotherapy with Bendamustine and Rituxan on 02/14/13. He is here for cycle 6.   Interim History:  Edward Clarke presents today for a follow-up visit. He continues to tolerate this chemotherapy without complications. He states his energy level is much better and has continued to improve. He reports no dyspnea on exertion. He reports no side effects due to chemotherapy. Constipation is now resolved.  He has not reported any major appetite changes.  Denies chest pain. No rashes.  He has not reported any alternation in his mental status. He did not report any neurological symptoms.  Overall, performance status and activity level remained reasonable and continue to improve. He is not reporting any fevers chills or sweats. He is reporting grade 1 arthralgia and myalgias. He reports no hospitalization or illnesses. He reports his blood sugars still rather elevated at times.  Medications: I have reviewed the patient's current medications.  Current Outpatient Prescriptions  Medication Sig Dispense Refill  . aspirin EC 81 MG tablet Take 81 mg by mouth every morning.      . Bendamustine HCl (TREANDA IV) Inject 240 mg into the vein. 2 times per month      . Glycerin-Hypromellose-PEG 400 ([facility] DRY EYE RELIEF) 0.2-0.2-1 % SOLN Place 1-2 drops into both eyes daily as needed (For dry eyes.).      [name] ibuprofen (ADVIL,MOTRIN) 200 MG tablet Take 400-800 mg by mouth  every 8 (eight) hours as needed for headache.      . insulin aspart (NOVOLOG FLEXPEN) 100 unit/mL SOLN FlexPen Inject 5-20 Units into the skin See admin instructions. He uses a sliding scale.      . Insulin Glargine (LANTUS SOLOSTAR) 100 UNIT/ML SOPN Inject 32 Units into the skin every morning.       [name] lisinopril (PRINIVIL,ZESTRIL) 10 MG tablet Take 10 mg by mouth every morning.       . metFORMIN (GLUCOPHAGE) 1000 MG tablet Take 1,000 mg by mouth 2 (two) times daily.       . Multiple Vitamin (MULTIVITAMIN WITH MINERALS) TABS Take 1 tablet by mouth every morning.      . RiTUXimab (RITUXAN IV) Inject into the vein.      . simvastatin (ZOCOR) 40 MG tablet Take 40 mg by mouth every morning.       . tobramycin (TOBREX) 0.3 % ophthalmic solution Place 1 drop into both eyes See admin instructions. He uses for 2 days prior to eye procedures.       No current facility-administered medications for this visit.     Allergies:  Allergies  Allergen Reactions  . Benazepril Other (See Comments)    Reaction unknown - may have been headaches    Past Medical History, Surgical history, Social history, and Family History were reviewed and updated.  Review of Systems:  Remaining ROS negative.  Physical Exam: Blood pressure 161/96, pulse 63, temperature 97.4 F (36.3 C), temperature source Oral, resp. rate 20, height 6\' 1"  (1.854 m), weight 327  lb 4.8 oz (148.462 kg). ECOG: 1 General appearance: alert Head: Normocephalic, without obvious abnormality, atraumatic Neck: no adenopathy, no carotid bruit, no JVD, supple, symmetrical, trachea midline and thyroid not enlarged, symmetric, no tenderness/mass/nodules Lymph nodes: Cervical, supraclavicular, and axillary nodes normal. Heart:regular rate and rhythm, S1, S2 normal, no murmur, click, rub or gallop Lung:chest clear, no wheezing, rales, normal symmetric air entry Abdomen: soft, non-tender, without masses or organomegaly EXT:no erythema, induration, or  nodules   Lab Results: Lab Results  Component Value Date   WBC 2.9* 07/04/2013   HGB 12.7* 07/04/2013   HCT 36.6* 07/04/2013   MCV 89.3 07/04/2013   PLT 191 07/04/2013     Chemistry      Component Value Date/Time   NA 139 06/06/2013 0856   NA 136 03/12/2013 1025   K 4.5 06/06/2013 0856   K 4.4 03/12/2013 1025   CL 102 03/12/2013 1025   CL 101 02/14/2013 0758   CO2 23 06/06/2013 0856   CO2 28 03/12/2013 1025   BUN 9.0 06/06/2013 0856   BUN 14 03/12/2013 1025   CREATININE 1.1 06/06/2013 0856   CREATININE 1.3 03/12/2013 1025      Component Value Date/Time   CALCIUM 9.8 06/06/2013 0856   CALCIUM 10.3 03/12/2013 1025   ALKPHOS 78 06/06/2013 0856   ALKPHOS 51 11/24/2010 1320   AST 20 06/06/2013 0856   AST 18 11/24/2010 1320   ALT 19 06/06/2013 0856   ALT 17 11/24/2010 1320   BILITOT 0.41 06/06/2013 0856   BILITOT 0.4 11/24/2010 1320     Results for SABA, NEUMAN (MRN [id number]) as of 07/04/2013 08:22  Ref. Range 02/14/2013 07:58 03/14/2013 08:38 06/06/2013 08:56  IgM, Serum Latest Range: 41-251 mg/dL 5621 (H) 3086 (H) 578 (H)   Results for Edward, Clarke (MRN [id number]) as of 07/04/2013 08:22  Ref. Range 02/14/2013 07:58 03/14/2013 08:38 06/06/2013 08:56  M-SPIKE, % No range found 5.62 3.55 0.53   Impression and Plan:  This is a pleasant 47 year old gentleman with the following issues: 1. IgM monoclonal paraproteinemia. This is likely due to  lymphoproliferative disorder such as Waldenstrm's or lymphoplasmacytic lymphoma.  He is status post five cycles of bendamustine and Rituxan. He has grade 1 fatigue and grade 1 nausea and grade 1 myalgias. Overall he tolerated the chemotherapy well with an improvement in his performance status. His protein studies were reviewed today and appeared to be nearing complete response after 5 cycles of chemotherapy. The plan is to proceed with the sixth cycle and continue with observation after that. 2. Anemia: likely related to #1 and recent chemotherapy. He has no  active bleeding. No transfusion is indicated at this point in time. Hgb is improving rapidly.  3. Hyperviscosity syndrome: This will less likely to happen at this time. 4.  Neutropenia prophylaxis: I added Neulasta to each cycle. 5. Nausea/vomiting prophylaxis: He has Compazine at home. 6.         Diabetes: Followed by his primary care physician. 7. Followup: In 6 weeks for recheck at that time.    SHADAD,FIRAS 10/30/20148:34 AM

## 2013-07-04 NOTE — Progress Notes (Signed)
Chaplain made initial visit with patient. Patient was friendly and in very good spirits. He immediately told chaplain that this was his second to last visit and that he was extremely excited about finishing treatment. He stated that his doctor said he "would be going into remission," because of the chemo. Patient went on to say that he had struggled with cancer for the past three years and that there had been a gap in his treatment because he lost his insurance after the company he worked for went out of business. Patient stated that he was in bad shape when he resumed treatment six months ago and that he had to be "almost carried" into the Cancer Center. He stated that now he is doing very well.  Patient described the difficulties of having to stay at home all the time and having little energy when he is used to being very active and social. He explained that since losing his job, he has been delivering pizzas part-time, but that he had to quit when his health declined. Patient also expressed that he has had trouble with his eyesight, especially his night vision, but that that is also improving. Patient is looking forward to working and having an active social life again. Chaplain practiced active listening and empathic presence.

## 2013-07-05 ENCOUNTER — Ambulatory Visit (HOSPITAL_BASED_OUTPATIENT_CLINIC_OR_DEPARTMENT_OTHER): Payer: Medicare Other

## 2013-07-05 VITALS — BP 156/93 | HR 63 | Temp 97.0°F | Resp 18

## 2013-07-05 DIAGNOSIS — Z5111 Encounter for antineoplastic chemotherapy: Secondary | ICD-10-CM

## 2013-07-05 DIAGNOSIS — D472 Monoclonal gammopathy: Secondary | ICD-10-CM | POA: Diagnosis not present

## 2013-07-05 MED ORDER — ONDANSETRON 8 MG/50ML IVPB (CHCC)
8.0000 mg | Freq: Once | INTRAVENOUS | Status: AC
  Administered 2013-07-05: 8 mg via INTRAVENOUS

## 2013-07-05 MED ORDER — DEXAMETHASONE SODIUM PHOSPHATE 10 MG/ML IJ SOLN
10.0000 mg | Freq: Once | INTRAMUSCULAR | Status: AC
  Administered 2013-07-05: 10 mg via INTRAVENOUS

## 2013-07-05 MED ORDER — SODIUM CHLORIDE 0.9 % IV SOLN
90.0000 mg/m2 | Freq: Once | INTRAVENOUS | Status: AC
  Administered 2013-07-05: 240 mg via INTRAVENOUS
  Filled 2013-07-05: qty 48

## 2013-07-05 MED ORDER — DEXAMETHASONE SODIUM PHOSPHATE 10 MG/ML IJ SOLN
INTRAMUSCULAR | Status: AC
  Filled 2013-07-05: qty 1

## 2013-07-05 MED ORDER — SODIUM CHLORIDE 0.9 % IV SOLN
Freq: Once | INTRAVENOUS | Status: AC
  Administered 2013-07-05: 15:00:00 via INTRAVENOUS

## 2013-07-05 MED ORDER — ONDANSETRON 8 MG/NS 50 ML IVPB
INTRAVENOUS | Status: AC
  Filled 2013-07-05: qty 8

## 2013-07-05 NOTE — Patient Instructions (Signed)
[facility] Discharge Instructions for Patients Receiving Chemotherapy  Today you received the following chemotherapy agent: Treanda   To help prevent nausea and vomiting after your treatment, we encourage you to take your nausea medication as prescribed.    If you develop nausea and vomiting that is not controlled by your nausea medication, call the clinic.   BELOW ARE SYMPTOMS THAT SHOULD BE REPORTED IMMEDIATELY:  *FEVER GREATER THAN 100.5 F  *CHILLS WITH OR WITHOUT FEVER  NAUSEA AND VOMITING THAT IS NOT CONTROLLED WITH YOUR NAUSEA MEDICATION  *UNUSUAL SHORTNESS OF BREATH  *UNUSUAL BRUISING OR BLEEDING  TENDERNESS IN MOUTH AND THROAT WITH OR WITHOUT PRESENCE OF ULCERS  *URINARY PROBLEMS  *BOWEL PROBLEMS  UNUSUAL RASH Items with * indicate a potential emergency and should be followed up as soon as possible.  Feel free to call the clinic you have any questions or concerns. The clinic phone number is [phone].    

## 2013-07-05 NOTE — Progress Notes (Signed)
Patient here today for last planned chemotherapy treatment. Upon assessment, this RN found his BP to be 156/93. Patient reports being unable to afford to refill several of his home meds, such as his BP med, metformin, and cholesterol medication. This RN called Leotis Shames, Social worker for Dr. Alver Fisher patients and left voicemail requesting she visit patient while he is here for treatment today. This RN will await call back from social work.   Per financial advocate, Salvatore Marvel, financial assistance is not available unless medication is prescribed by a cancer center MD. Patient states his BP, DM, and cholesterol meds are prescribed by his diabetes doctor, Dr. Lucianne Muss. This was explained to the patient and he voices understanding. He states he will call this doctor's office to see if financial assistance is available to him.   This RN emphasized the importance of taking his medication as prescribed and practicing proper medication safety. Patient verbalized understanding and reports he will try to find finances to refill his meds.

## 2013-07-06 ENCOUNTER — Ambulatory Visit (HOSPITAL_BASED_OUTPATIENT_CLINIC_OR_DEPARTMENT_OTHER): Payer: Medicare Other

## 2013-07-06 VITALS — BP 173/86 | HR 65 | Temp 97.8°F

## 2013-07-06 DIAGNOSIS — D472 Monoclonal gammopathy: Secondary | ICD-10-CM | POA: Diagnosis not present

## 2013-07-06 MED ORDER — PEGFILGRASTIM INJECTION 6 MG/0.6ML
6.0000 mg | Freq: Once | SUBCUTANEOUS | Status: AC
  Administered 2013-07-06: 6 mg via SUBCUTANEOUS

## 2013-07-08 LAB — SPEP & IFE WITH QIG
Alpha-2-Globulin: 8.3 % (ref 7.1–11.8)
Beta 2: 3.6 % (ref 3.2–6.5)
Beta Globulin: 4.6 % — ABNORMAL LOW (ref 4.7–7.2)
Gamma Globulin: 13.9 % (ref 11.1–18.8)
IgG (Immunoglobin G), Serum: 791 mg/dL (ref 650–1600)
M-Spike, %: 0.42 g/dL

## 2013-07-11 ENCOUNTER — Other Ambulatory Visit: Payer: Self-pay

## 2013-07-22 ENCOUNTER — Encounter: Payer: Self-pay | Admitting: Endocrinology

## 2013-07-22 ENCOUNTER — Ambulatory Visit (INDEPENDENT_AMBULATORY_CARE_PROVIDER_SITE_OTHER): Payer: Medicare Other | Admitting: Endocrinology

## 2013-07-22 VITALS — BP 134/84 | HR 65 | Temp 98.6°F | Resp 12 | Ht 73.0 in | Wt 326.0 lb

## 2013-07-22 DIAGNOSIS — IMO0001 Reserved for inherently not codable concepts without codable children: Secondary | ICD-10-CM | POA: Diagnosis not present

## 2013-07-22 DIAGNOSIS — D729 Disorder of white blood cells, unspecified: Secondary | ICD-10-CM

## 2013-07-22 DIAGNOSIS — C8583 Other specified types of non-Hodgkin lymphoma, intra-abdominal lymph nodes: Secondary | ICD-10-CM

## 2013-07-22 DIAGNOSIS — I10 Essential (primary) hypertension: Secondary | ICD-10-CM

## 2013-07-22 DIAGNOSIS — D7289 Other specified disorders of white blood cells: Secondary | ICD-10-CM | POA: Diagnosis not present

## 2013-07-22 MED ORDER — LISINOPRIL 10 MG PO TABS: 10.0000 mg | ORAL_TABLET | Freq: Every morning | ORAL | Status: DC

## 2013-07-22 NOTE — Patient Instructions (Signed)
Restart Lisinopril 10 mg daily  Please check blood sugars at least half the time about 2 hours after any meal and as directed on waking up. Please bring blood sugar monitor to each visit  Reduce Novolog at meals if sugars < 130 about 2 hrs later  Extra insulin 3-6 Novolog if > 180 before meals

## 2013-07-22 NOTE — Progress Notes (Signed)
Patient ID: Edward Clarke, male   DOB: [birth year], 47 y.o.   MRN: [id number]  Reason for Appointment: Diabetes follow-up   History of Present Illness    Diagnosis: Type 2 diabetes mellitus, date of diagnosis: 2005.   He has been on basal bolus insulin regimen the last few years along with metformin Most of his difficulty with control is related to his noncompliance with diet, medications, glucose monitoring and financial issues He is usually running out of one or the other medication and blood sugars will not be well controlled He has been out of his metformin and he thinks his blood sugars were higher with this. He has increased his NovoLog insulin significantly and not clear why. Also has gained a significant amount of weight He also did not do any blood sugar monitoring and has just picked up his test strips INSULIN: Lantus 32 units in the morning before breakfast, NovoLog before meals, 20-25 ac  No hypoglycemia recently Difficulties with medication adherence: Frequent, financial limitations,  Proper timing of medications in relation to meals: Yes.  Monitors blood glucose: none,  no strips  Glucometer: One Touch.  Blood Glucose readings: None   Hypoglycemia frequency: Rarely now.  Meals: Usually 2 meals a day (supper 5-9 pm)    Carbohydrate intake: variable.  Food preferences: usually low fat.  Physical activity: exercise: some walking  Wt Readings from Last 3 Encounters:  07/22/13 326 lb (147.873 kg)  07/04/13 327 lb 4.8 oz (148.462 kg)  06/06/13 311 lb 11.2 oz (141.386 kg)   Complications: none.  Retinal exam: Most recent: 2014.   The  HbgA1c results this year have been 7.9, previously 8.3 in 4/14, last year as high as 11.6.  Lab Results  Component Value Date   HGBA1C 9.2* 07/22/2013   HGBA1C 7.9* 03/12/2013   Lab Results  Component Value Date   CREATININE 1.0 07/22/2013    HYPERTENSION:  has had long-standing hypertension and recently has been off his lisinopril;  however blood pressure appears to be higher with his weight gain  HYPERLIPIDEMIA:  The lipid abnormality consists of elevated LDL treated with simvastatin .        Medication List       This list is accurate as of: 07/22/13 11:59 PM.  Always use your most recent med list.               aspirin EC 81 MG tablet  Take 81 mg by mouth every morning.     [facility] DRY EYE RELIEF 0.2-0.2-1 % Soln  Generic drug:  Glycerin-Hypromellose-PEG 400  Place 1-2 drops into both eyes daily as needed (For dry eyes.).     ibuprofen 200 MG tablet  Commonly known as:  ADVIL,MOTRIN  Take 400-800 mg by mouth every 8 (eight) hours as needed for headache.     LANTUS SOLOSTAR 100 UNIT/ML Sopn  Generic drug:  Insulin Glargine  Inject 32 Units into the skin every morning.     lisinopril 10 MG tablet  Commonly known as:  PRINIVIL,ZESTRIL  Take 1 tablet (10 mg total) by mouth every morning.     metFORMIN 1000 MG tablet  Commonly known as:  GLUCOPHAGE  Take 1,000 mg by mouth 2 (two) times daily.     multivitamin with minerals Tabs tablet  Take 1 tablet by mouth every morning.     NOVOLOG FLEXPEN 100 UNIT/ML Sopn FlexPen  Generic drug:  insulin aspart  Inject 5-20 Units into the skin See admin instructions. He uses  a sliding scale.     RITUXAN IV  Inject into the vein.     simvastatin 40 MG tablet  Commonly known as:  ZOCOR  Take 40 mg by mouth every morning.     tobramycin 0.3 % ophthalmic solution  Commonly known as:  TOBREX  Place 1 drop into both eyes See admin instructions. He uses for 2 days prior to eye procedures.     TREANDA IV  Inject 240 mg into the vein. 2 times per month        Allergies:  Allergies  Allergen Reactions  . Benazepril Other (See Comments)    Reaction unknown - may have been headaches    Past Medical History  Diagnosis Date  . Monoclonal paraproteinemia   . Diabetes mellitus   . Hypertension   . Obesity   . Hyperlipidemia     Past Surgical History   Procedure Laterality Date  . Appendectomy    . Eye surgery    . Tonsillectomy      No family history on file.  Social History:  reports that he has quit smoking. His smoking use included Cigarettes. He smoked 0.00 packs per day. He does not have any smokeless tobacco history on file. He reports that he drinks about 1.2 ounces of alcohol per week. He reports that he uses illicit drugs (Marijuana) about 7 times per week.  Review of Systems - Cardiovascular ROS: positive for - hypertension On chemotherapy for myeloma with Bendamustine and Rituxan on 02/14/13.   Hyperlipidemia: He has been periodically running out of his medication   Examination:   BP 134/84  Pulse 65  Temp(Src) 98.6 F (37 C)  Resp 12  Ht 6\' 1"  (1.854 m)  Wt 326 lb (147.873 kg)  BMI 43.02 kg/m2  SpO2 97%  Body mass index is 43.02 kg/(m^2).   Assesment/plan:   1. Diabetes type 2, uncontrolled - 250.02  The patient's diabetes control is difficult to assess because of lack of home glucose monitoring He has gained a lot of weight and this is likely to be from not taking his metformin as well as inconsistent diet Discussed timing of glucose monitoring, blood sugar targets and adjustment of both Lantus and NovoLog insulin  2. Currently not on antihypertensives and will resume lisinopril with blood pressure going up higher   Nanako Stopher 07/23/2013, 3:53 PM

## 2013-07-23 LAB — HEMOGLOBIN A1C: Hgb A1c MFr Bld: 9.2 % — ABNORMAL HIGH (ref 4.6–6.5)

## 2013-07-23 LAB — BASIC METABOLIC PANEL
BUN: 9 mg/dL (ref 6–23)
Calcium: 9.7 mg/dL (ref 8.4–10.5)
Chloride: 104 mEq/L (ref 96–112)
Creatinine, Ser: 1 mg/dL (ref 0.4–1.5)
GFR: 107.19 mL/min (ref >60.00)
Glucose, Bld: 143 mg/dL — ABNORMAL HIGH (ref 70–99)
Potassium: 4.2 mEq/L (ref 3.5–5.1)

## 2013-08-15 DIAGNOSIS — E1139 Type 2 diabetes mellitus with other diabetic ophthalmic complication: Secondary | ICD-10-CM | POA: Diagnosis not present

## 2013-08-15 DIAGNOSIS — E11349 Type 2 diabetes mellitus with severe nonproliferative diabetic retinopathy without macular edema: Secondary | ICD-10-CM | POA: Diagnosis not present

## 2013-08-15 DIAGNOSIS — E1039 Type 1 diabetes mellitus with other diabetic ophthalmic complication: Secondary | ICD-10-CM | POA: Diagnosis not present

## 2013-08-15 DIAGNOSIS — E11359 Type 2 diabetes mellitus with proliferative diabetic retinopathy without macular edema: Secondary | ICD-10-CM | POA: Diagnosis not present

## 2013-08-20 ENCOUNTER — Other Ambulatory Visit (HOSPITAL_BASED_OUTPATIENT_CLINIC_OR_DEPARTMENT_OTHER): Payer: Medicare Other

## 2013-08-20 DIAGNOSIS — D472 Monoclonal gammopathy: Secondary | ICD-10-CM

## 2013-08-20 LAB — CBC WITH DIFFERENTIAL/PLATELET
BASO%: 1.4 % (ref 0.0–2.0)
Basophils Absolute: 0 10*3/uL (ref 0.0–0.1)
HCT: 36.9 % — ABNORMAL LOW (ref 38.4–49.9)
HGB: 12.9 g/dL — ABNORMAL LOW (ref 13.0–17.1)
LYMPH%: 10.6 % — ABNORMAL LOW (ref 14.0–49.0)
MCH: 31.8 pg (ref 27.2–33.4)
MCHC: 35 g/dL (ref 32.0–36.0)
MONO#: 0.4 10*3/uL (ref 0.1–0.9)
MONO%: 18.2 % — ABNORMAL HIGH (ref 0.0–14.0)
NEUT%: 65.6 % (ref 39.0–75.0)
Platelets: 254 10*3/uL (ref 140–400)
RBC: 4.05 10*6/uL — ABNORMAL LOW (ref 4.20–5.82)
RDW: 14.7 % — ABNORMAL HIGH (ref 11.0–14.6)
WBC: 2.1 10*3/uL — ABNORMAL LOW (ref 4.0–10.3)
lymph#: 0.2 10*3/uL — ABNORMAL LOW (ref 0.9–3.3)

## 2013-08-20 LAB — COMPREHENSIVE METABOLIC PANEL (CC13)
Anion Gap: 9 mEq/L (ref 3–11)
BUN: 11.5 mg/dL (ref 7.0–26.0)
CO2: 26 mEq/L (ref 22–29)
Calcium: 9.8 mg/dL (ref 8.4–10.4)
Chloride: 105 mEq/L (ref 98–109)
Creatinine: 1.1 mg/dL (ref 0.7–1.3)
Total Bilirubin: 0.38 mg/dL (ref 0.20–1.20)

## 2013-08-21 ENCOUNTER — Telehealth: Payer: Self-pay | Admitting: Oncology

## 2013-08-21 ENCOUNTER — Ambulatory Visit (HOSPITAL_BASED_OUTPATIENT_CLINIC_OR_DEPARTMENT_OTHER): Payer: Medicare Other | Admitting: Oncology

## 2013-08-21 VITALS — BP 143/97 | HR 75 | Temp 98.2°F | Resp 20 | Ht 73.0 in | Wt 340.3 lb

## 2013-08-21 DIAGNOSIS — D472 Monoclonal gammopathy: Secondary | ICD-10-CM | POA: Diagnosis not present

## 2013-08-21 NOTE — Progress Notes (Signed)
Hematology and Oncology Follow Up Visit  Christophe Rising [id number] 1966/03/22 47 y.o. 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  He had presented with IgM about 3 g/dL and M-spike about 2 g/dL.  His workup that includes imaging studies, CT scan of the chest, abdomen and pelvis and skeletal survey had been really unrevealing. Bone marrow biopsy confirmed the diagnosis.    Previous therapy: Started chemotherapy with Bendamustine and Rituxan on 02/14/13. He is S/P cycle 6 on 07/04/2013 with CR.   Current therapy: Observation and surveillance.  Interim History:  Mr. Mancinas presents today for a follow-up visit. He tolerated  chemotherapy without complications. He states his energy level is much better and has continued to improve. He reports no dyspnea on exertion. He reports no side effects due to chemotherapy. Constipation is now resolved.  He has not reported any major appetite changes.  Denies chest pain. No rashes.  He has not reported any alternation in his mental status. He did not report any neurological symptoms.  Overall, performance status and activity level remained reasonable and continue to improve. He is not reporting any fevers chills or sweats. He is reporting grade 1 arthralgia and myalgias. He reports no hospitalization or illnesses. He reports his blood sugars still rather elevated at times and continues to followup with Dr. Lucianne Muss regarding that.  Medications: I have reviewed the patient's current medications.  Current Outpatient Prescriptions  Medication Sig Dispense Refill  . aspirin EC 81 MG tablet Take 81 mg by mouth every morning.      . Bendamustine HCl (TREANDA IV) Inject 240 mg into the vein. 2 times per month      . Glycerin-Hypromellose-PEG 400 ([facility] DRY EYE RELIEF) 0.2-0.2-1 % SOLN Place 1-2 drops into both  eyes daily as needed (For dry eyes.).      [name] ibuprofen (ADVIL,MOTRIN) 200 MG tablet Take 400-800 mg by mouth every 8 (eight) hours as needed for headache.      . insulin aspart (NOVOLOG FLEXPEN) 100 unit/mL SOLN FlexPen Inject 5-20 Units into the skin See admin instructions. He uses a sliding scale.      . Insulin Glargine (LANTUS SOLOSTAR) 100 UNIT/ML SOPN Inject 32 Units into the skin every morning.       [name] lisinopril (PRINIVIL,ZESTRIL) 10 MG tablet Take 1 tablet (10 mg total) by mouth every morning.  30 tablet  3  . metFORMIN (GLUCOPHAGE) 1000 MG tablet Take 1,000 mg by mouth 2 (two) times daily.       . Multiple Vitamin (MULTIVITAMIN WITH MINERALS) TABS Take 1 tablet by mouth every morning.      . RiTUXimab (RITUXAN IV) Inject into the vein.      . simvastatin (ZOCOR) 40 MG tablet Take 40 mg by mouth every morning.       . tobramycin (TOBREX) 0.3 % ophthalmic solution Place 1 drop into both eyes See admin instructions. He uses for 2 days prior to eye procedures.       No current facility-administered medications for this visit.     Allergies:  Allergies  Allergen Reactions  . Benazepril Other (See Comments)    Reaction unknown - may have been headaches    Past Medical History, Surgical history, Social history, and Family History were reviewed and updated.  Review of Systems:  Remaining ROS negative.  Physical Exam: Blood pressure 143/97,  pulse 75, temperature 98.2 F (36.8 C), temperature source Oral, resp. rate 20, height 6\' 1"  (1.854 m), weight 340 lb 4.8 oz (154.359 kg), SpO2 100.00%. ECOG: 1 General appearance: alert Head: Normocephalic, without obvious abnormality, atraumatic Neck: no adenopathy, no carotid bruit, no JVD, supple, symmetrical, trachea midline and thyroid not enlarged, symmetric, no tenderness/mass/nodules Lymph nodes: Cervical, supraclavicular, and axillary nodes normal. Heart:regular rate and rhythm, S1, S2 normal, no murmur, click, rub or  gallop Lung:chest clear, no wheezing, rales, normal symmetric air entry Abdomen: soft, non-tender, without masses or organomegaly EXT:no erythema, induration, or nodules   Lab Results: Lab Results  Component Value Date   WBC 2.1* 08/20/2013   HGB 12.9* 08/20/2013   HCT 36.9* 08/20/2013   MCV 91.0 08/20/2013   PLT 254 08/20/2013     Chemistry      Component Value Date/Time   NA 140 08/20/2013 1043   NA 138 07/22/2013 1645   K 4.4 08/20/2013 1043   K 4.2 07/22/2013 1645   CL 104 07/22/2013 1645   CL 101 02/14/2013 0758   CO2 26 08/20/2013 1043   CO2 29 07/22/2013 1645   BUN 11.5 08/20/2013 1043   BUN 9 07/22/2013 1645   CREATININE 1.1 08/20/2013 1043   CREATININE 1.0 07/22/2013 1645      Component Value Date/Time   CALCIUM 9.8 08/20/2013 1043   CALCIUM 9.7 07/22/2013 1645   ALKPHOS 74 08/20/2013 1043   ALKPHOS 51 11/24/2010 1320   AST 26 08/20/2013 1043   AST 18 11/24/2010 1320   ALT 29 08/20/2013 1043   ALT 17 11/24/2010 1320   BILITOT 0.38 08/20/2013 1043   BILITOT 0.4 11/24/2010 1320     Results for OSWALD, POTT (MRN [id number]) as of 08/21/2013 09:42  Ref. Range 03/14/2013 08:38 06/06/2013 08:56 07/04/2013 08:09  M-SPIKE, % No range found 3.55 0.53 0.42  Results for CYRUS, RAMSBURG (MRN [id number]) as of 08/21/2013 09:42  Ref. Range 03/14/2013 08:38 06/06/2013 08:56 07/04/2013 08:09  IgM, Serum Latest Range: 41-251 mg/dL 7829 (H) 562 (H) 130 (H)    Impression and Plan:  This is a pleasant 47 year old gentleman with the following issues: 1. IgM monoclonal paraproteinemia. This is likely due to  lymphoproliferative disorder such as Waldenstrm's or lymphoplasmacytic lymphoma.  He is status post 6 cycles of bendamustine and Rituxan. Therapy has been well tolerated and he had a near complete response with normalization of his IgM level and near disappearance of his M spike. For the time being we'll continue active surveillance and repeat protein studies in 3 months and  restart chemotherapy on relapse. 2. Anemia: likely related to #1 and recent chemotherapy.  His hemoglobin has normalized. 3. Hyperviscosity syndrome: This will less likely to happen at this time. 4.  Neutropenia prophylaxis: I added Neulasta to each cycle. 5. Nausea/vomiting prophylaxis: He has Compazine at home. 6.         Diabetes: Followed by his primary care physician. 7. Followup: In 12 weeks for recheck at that time.    Hiro Vipond 12/17/20149:40 AM

## 2013-08-21 NOTE — Telephone Encounter (Signed)
appts made per 12/17 POF AVS and CAL printed Office note not in yet to compare POF to shh

## 2013-08-23 LAB — SPEP & IFE WITH QIG
Beta 2: 4 % (ref 3.2–6.5)
Beta Globulin: 4.7 % (ref 4.7–7.2)
Gamma Globulin: 12.3 % (ref 11.1–18.8)
IgA: 27 mg/dL — ABNORMAL LOW (ref 68–379)
IgG (Immunoglobin G), Serum: 797 mg/dL (ref 650–1600)
IgM, Serum: 286 mg/dL — ABNORMAL HIGH (ref 41–251)
M-Spike, %: 0.37 g/dL

## 2013-09-25 ENCOUNTER — Ambulatory Visit: Payer: Medicare Other | Admitting: Endocrinology

## 2013-09-27 ENCOUNTER — Encounter: Payer: Self-pay | Admitting: Endocrinology

## 2013-09-27 ENCOUNTER — Ambulatory Visit (INDEPENDENT_AMBULATORY_CARE_PROVIDER_SITE_OTHER): Payer: Medicare Other | Admitting: Endocrinology

## 2013-09-27 VITALS — BP 142/88 | HR 71 | Temp 98.2°F | Resp 14 | Ht 73.5 in | Wt 337.4 lb

## 2013-09-27 DIAGNOSIS — I10 Essential (primary) hypertension: Secondary | ICD-10-CM

## 2013-09-27 DIAGNOSIS — IMO0001 Reserved for inherently not codable concepts without codable children: Secondary | ICD-10-CM

## 2013-09-27 DIAGNOSIS — E1165 Type 2 diabetes mellitus with hyperglycemia: Principal | ICD-10-CM

## 2013-09-27 NOTE — Progress Notes (Addendum)
Patient ID: Edward Clarke, male   DOB: Jul 26, 1966, 48 y.o.   MRN: [id number]  Reason for Appointment: Diabetes follow-up   History of Present Illness    Diagnosis: Type 2 diabetes mellitus, date of diagnosis: 2005.   He has been on basal bolus insulin regimen in the last few years along with metformin Most of the difficulty with control is related to his noncompliance with diet, medications, glucose monitoring and financial issues Previously had been  out of one or the other medication or insulin  and blood sugars will not be well controlled Recent history:  He  was  out of his metformin  on his last visit but has gone back on this and he thinks he is taking it regularly He also did not do any blood sugar monitoring before and did not bring his monitor for review today. Also appears to be doing only fasting readings recently and  Not clear how often  He still has difficulty with dietary compliance but has managed to lose 3 pounds since last visit FASTING blood sugars are still not at target and he is not understanding the need to increase Lantus to improve fasting readings Also unclear if he is getting appropriate dose of NOVOLOG insulin for his evening meal, not adjusting dose based on meal size or blood sugar level No recent labs available to assess his diabetes control but A1c was 9.2 in November  INSULIN: Lantus 32 units in the morning before breakfast, NovoLog before meals, 20  ac He may have a snack midday but does not take any NovoLog for this  Proper timing of medications in relation to meals: Yes.   Monitors blood glucose: Occasionally, has 7 readings in the last month   Glucometer: One Touch.  Blood Glucose readings: 151-180 early morning, midday 230, 215, and afternoon 163  No hypoglycemia recently  Meals: Usually 2 meals a day (supper 5-9 pm)    Carbohydrate intake: variable.  Food preferences: usually low fat.  Physical activity: exercise: none  Wt Readings from Last 3  Encounters:  09/27/13 337 lb 6.4 oz (153.044 kg)  08/21/13 340 lb 4.8 oz (154.359 kg)  07/22/13 326 lb (010.932 kg)   Complications: none.  Retinal exam: Most recent: 2014.   The  HbgA1c has been as high as 11.6 previously .  Lab Results  Component Value Date   HGBA1C 9.2* 07/22/2013   HGBA1C 7.9* 03/12/2013   Lab Results  Component Value Date   CREATININE 1.1 08/20/2013    HYPERTENSION:  has had long-standing hypertension and he thinks he is taking a pink pill, probably lisinopril; however blood pressure appears to be  still high   HYPERLIPIDEMIA:  The lipid abnormality consists of elevated LDL treated with simvastatin. Does not know if he is taking this .        Medication List       This list is accurate as of: 09/27/13  4:21 PM.  Always use your most recent med list.               aspirin EC 81 MG tablet  Take 81 mg by mouth every morning.     ibuprofen 200 MG tablet  Commonly known as:  ADVIL,MOTRIN  Take 400-800 mg by mouth every 8 (eight) hours as needed for headache.     LANTUS SOLOSTAR 100 UNIT/ML Solostar Pen  Generic drug:  Insulin Glargine  Inject 32 Units into the skin every morning.     lisinopril 10 MG  tablet  Commonly known as:  PRINIVIL,ZESTRIL  Take 1 tablet (10 mg total) by mouth every morning.     metFORMIN 1000 MG tablet  Commonly known as:  GLUCOPHAGE  Take 1,000 mg by mouth 2 (two) times daily.     multivitamin with minerals Tabs tablet  Take 1 tablet by mouth every morning.     NOVOLOG FLEXPEN 100 UNIT/ML FlexPen  Generic drug:  insulin aspart  Inject 5-20 Units into the skin See admin instructions. He uses a sliding scale.     simvastatin 40 MG tablet  Commonly known as:  ZOCOR  Take 40 mg by mouth every morning.     tobramycin 0.3 % ophthalmic solution  Commonly known as:  TOBREX  Place 1 drop into both eyes See admin instructions. He uses for 2 days prior to eye procedures.        Allergies:  Allergies  Allergen  Reactions  . Benazepril Other (See Comments)    Reaction unknown - may have been headaches    Past Medical History  Diagnosis Date  . Monoclonal paraproteinemia   . Diabetes mellitus   . Hypertension   . Obesity   . Hyperlipidemia     Past Surgical History  Procedure Laterality Date  . Appendectomy    . Eye surgery    . Tonsillectomy      No family history on file.  Social History:  reports that he has quit smoking. His smoking use included Cigarettes. He smoked 0.00 packs per day. He does not have any smokeless tobacco history on file. He reports that he drinks about 1.2 ounces of alcohol per week. He reports that he uses illicit drugs (Marijuana) about 7 times per week.  Review of Systems - Cardiovascular ROS: positive for - hypertension On chemotherapy for myeloma with Bendamustine and Rituxan on 02/14/13.   Hyperlipidemia: He has been periodically running out of his medication   Examination:   BP 142/88  Pulse 71  Temp(Src) 98.2 F (36.8 C)  Resp 14  Ht 6' 1.5" (1.867 m)  Wt 337 lb 6.4 oz (153.044 kg)  BMI 43.91 kg/m2  SpO2 97%  Body mass index is 43.91 kg/(m^2).   Assesment/plan:   1. Diabetes type 2, uncontrolled - 250.02  The patient's diabetes control is difficult to assess because of inadequate home glucose monitoring recent A1c not available Problems identified:  Although he has lost a little weight he is still not consistent with diet   Still not motivated to exercise  No post prandial monitoring  Not adjusting Lantus based on fasting blood sugars, does not understand but sugar targets  May be having high readings after his daytime snacks when he does not take NovoLog  For now he will increase his Lantus to 36 and discussed fasting blood sugar targets To start monitoring after meals and adjust his NovoLog if postprandial readings are high Continue metformin  2. Currently  blood pressure is high and may need to increase his lisinopril to 20 mg,  he will confirm that he is taking this 3. Also will need to restart his simvastatin 4. Check A1c and lipids in 6 weeks   Laird Runnion 09/27/2013, 4:21 PM

## 2013-09-27 NOTE — Patient Instructions (Addendum)
Lantus 36 units daily Please check blood sugars at least half the time about 2 hours after any meal and as directed on waking up. Please bring blood sugar monitor to each visit  Confirm doses of Lisinopril and Simvastatin  Walking or Connect!

## 2013-10-04 ENCOUNTER — Other Ambulatory Visit: Payer: Self-pay | Admitting: *Deleted

## 2013-10-04 DIAGNOSIS — D729 Disorder of white blood cells, unspecified: Secondary | ICD-10-CM

## 2013-10-04 DIAGNOSIS — C8583 Other specified types of non-Hodgkin lymphoma, intra-abdominal lymph nodes: Secondary | ICD-10-CM

## 2013-10-04 MED ORDER — INSULIN GLARGINE 100 UNIT/ML SOLOSTAR PEN: 32.0000 [IU] | PEN_INJECTOR | Freq: Every morning | SUBCUTANEOUS | Status: DC

## 2013-10-04 MED ORDER — LISINOPRIL 40 MG PO TABS: 40.0000 mg | ORAL_TABLET | Freq: Every day | ORAL | Status: DC

## 2013-10-04 MED ORDER — SIMVASTATIN 40 MG PO TABS: 40.0000 mg | ORAL_TABLET | Freq: Every morning | ORAL | Status: DC

## 2013-10-04 MED ORDER — METFORMIN HCL 1000 MG PO TABS: 1000.0000 mg | ORAL_TABLET | Freq: Two times a day (BID) | ORAL | Status: DC

## 2013-10-04 MED ORDER — INSULIN ASPART 100 UNIT/ML FLEXPEN: 5.0000 [IU] | PEN_INJECTOR | SUBCUTANEOUS | Status: DC

## 2013-11-04 ENCOUNTER — Other Ambulatory Visit (INDEPENDENT_AMBULATORY_CARE_PROVIDER_SITE_OTHER): Payer: Medicare Other

## 2013-11-04 DIAGNOSIS — IMO0001 Reserved for inherently not codable concepts without codable children: Secondary | ICD-10-CM | POA: Diagnosis not present

## 2013-11-04 DIAGNOSIS — E1165 Type 2 diabetes mellitus with hyperglycemia: Principal | ICD-10-CM

## 2013-11-04 LAB — URINALYSIS, ROUTINE W REFLEX MICROSCOPIC
BILIRUBIN URINE: NEGATIVE
Hgb urine dipstick: NEGATIVE
Ketones, ur: NEGATIVE
LEUKOCYTES UA: NEGATIVE
NITRITE: NEGATIVE
PH: 6 (ref 5.0–8.0)
Specific Gravity, Urine: 1.01 (ref 1.000–1.030)
Total Protein, Urine: NEGATIVE
UROBILINOGEN UA: 0.2 (ref 0.0–1.0)
Urine Glucose: NEGATIVE

## 2013-11-04 LAB — MICROALBUMIN / CREATININE URINE RATIO
Creatinine,U: 103.4 mg/dL
Microalb Creat Ratio: 1.1 mg/g (ref 0.0–30.0)
Microalb, Ur: 1.1 mg/dL (ref 0.0–1.9)

## 2013-11-05 LAB — LIPID PANEL
CHOLESTEROL: 173 mg/dL (ref 0–200)
HDL: 36.8 mg/dL — ABNORMAL LOW (ref >39.00)
LDL CALC: 99 mg/dL (ref 0–99)
TRIGLYCERIDES: 188 mg/dL — AB (ref 0.0–149.0)
Total CHOL/HDL Ratio: 5
VLDL: 37.6 mg/dL (ref 0.0–40.0)

## 2013-11-05 LAB — HEMOGLOBIN A1C: HEMOGLOBIN A1C: 8.2 % — AB (ref 4.6–6.5)

## 2013-11-05 LAB — GLUCOSE, RANDOM: Glucose, Bld: 111 mg/dL — ABNORMAL HIGH (ref 70–99)

## 2013-11-08 ENCOUNTER — Ambulatory Visit (INDEPENDENT_AMBULATORY_CARE_PROVIDER_SITE_OTHER): Payer: Medicare Other | Admitting: Endocrinology

## 2013-11-08 ENCOUNTER — Encounter: Payer: Self-pay | Admitting: Endocrinology

## 2013-11-08 VITALS — BP 132/88 | HR 78 | Temp 98.6°F | Resp 16 | Ht 73.5 in | Wt 339.6 lb

## 2013-11-08 DIAGNOSIS — I10 Essential (primary) hypertension: Secondary | ICD-10-CM

## 2013-11-08 DIAGNOSIS — E1165 Type 2 diabetes mellitus with hyperglycemia: Principal | ICD-10-CM

## 2013-11-08 DIAGNOSIS — IMO0001 Reserved for inherently not codable concepts without codable children: Secondary | ICD-10-CM

## 2013-11-08 NOTE — Patient Instructions (Signed)
Lantus to 36 or 38 to keep am sugar <130  Extra 1 Novolog per 20 pts over 140  Walk daily

## 2013-11-08 NOTE — Progress Notes (Signed)
Patient ID: Edward Clarke, male   DOB: 02-08-1966, 48 y.o.   MRN: [id number]   Reason for Appointment: Diabetes follow-up   History of Present Illness    Diagnosis: Type 2 diabetes mellitus, date of diagnosis: 2005.   He has been on basal bolus insulin regimen in the last few years along with metformin Most of the difficulty with control is related to his noncompliance with diet, medications, glucose monitoring and financial issues Previously had been  out of one or the other medication or insulin  and blood sugars will not be well controlled  Recent history:  He thinks he has been compliant with his insulin and metformin Although his A1c is relatively better still it is not at target and over 8% He has not checked many readings after meals lately and probably still has periodic high readings after meals and overnight Also not clear if he is taking NovoLog with all his meals and snacks consistently; however will take 10 units with his morning coffee use he has a high fasting reading Still not able to lose weight because of lack of exercise and inconsistent diet Has done a little better with taking readings later in the day He says he has no prescription coverage for his medications  INSULIN: Lantus 34 units in the morning before breakfast, NovoLog before meals, 10-20 ac Proper timing of medications in relation to meals: Yes.   Monitors blood glucose: Once daily, has 14 readings in the last month   Glucometer: One Touch.  Blood Glucose readings:   PREMEAL Breakfast Lunch Dinner Bedtime Overall  Glucose range:  135-157   129, 112   131   149,228    median:      140     No hypoglycemia recently  Meals: Usually 2 meals a day (supper 5-9 pm)    Carbohydrate intake: variable.  Food preferences: usually low fat, eating out at [place], rarely fast food  Physical activity: exercise: none  Wt Readings from Last 3 Encounters:  11/08/13 339 lb 9.6 oz (154.042 kg)  09/27/13 337 lb 6.4 oz  (153.044 kg)  08/21/13 340 lb 4.8 oz (154.359 kg)    Retinal exam: Most recent: 2014.   The  HbgA1c has been as high as 11.6 previously .  Lab Results  Component Value Date   HGBA1C 8.2* 11/04/2013   HGBA1C 9.2* 07/22/2013   HGBA1C 7.9* 03/12/2013   Lab Results  Component Value Date   MICROALBUR 1.1 11/04/2013   LDLCALC 99 11/04/2013   CREATININE 1.1 08/20/2013    HYPERTENSION:  has had long-standing hypertension and blood pressure is not adequately controlled currently with taking 40 mg lisinopril. He thinks he is compliant with this Does not always watch his salt intake but no pedal edema  HYPERLIPIDEMIA:  The lipid abnormality consists of elevated LDL treated with simvastatin. Recently taking this with good control       Medication List       This list is accurate as of: 11/08/13  4:35 PM.  Always use your most recent med list.               aspirin EC 81 MG tablet  Take 81 mg by mouth every morning.     ibuprofen 200 MG tablet  Commonly known as:  ADVIL,MOTRIN  Take 400-800 mg by mouth every 8 (eight) hours as needed for headache.     insulin aspart 100 UNIT/ML FlexPen  Commonly known as:  NOVOLOG FLEXPEN  Inject 5-20  Units into the skin See admin instructions. He uses a sliding scale.     Insulin Glargine 100 UNIT/ML Solostar Pen  Commonly known as:  LANTUS  Inject 34 Units into the skin every morning.     lisinopril 40 MG tablet  Commonly known as:  PRINIVIL,ZESTRIL  Take 1 tablet (40 mg total) by mouth daily.     metFORMIN 1000 MG tablet  Commonly known as:  GLUCOPHAGE  Take 1 tablet (1,000 mg total) by mouth 2 (two) times daily.     multivitamin with minerals Tabs tablet  Take 1 tablet by mouth every morning.     simvastatin 40 MG tablet  Commonly known as:  ZOCOR  Take 1 tablet (40 mg total) by mouth every morning.     tobramycin 0.3 % ophthalmic solution  Commonly known as:  TOBREX  Place 1 drop into both eyes See admin instructions. He uses for 2  days prior to eye procedures.        Allergies:  Allergies  Allergen Reactions  . Benazepril Other (See Comments)    Reaction unknown - may have been headaches    Past Medical History  Diagnosis Date  . Monoclonal paraproteinemia   . Diabetes mellitus   . Hypertension   . Obesity   . Hyperlipidemia     Past Surgical History  Procedure Laterality Date  . Appendectomy    . Eye surgery    . Tonsillectomy      No family history on file.  Social History:  reports that he has quit smoking. His smoking use included Cigarettes. He smoked 0.00 packs per day. He does not have any smokeless tobacco history on file. He reports that he drinks about 1.2 ounces of alcohol per week. He reports that he uses illicit drugs (Marijuana) about 7 times per week.  Review of Systems:  Has had chemotherapy for myeloma   No numbness or tingling in his feet   Examination:   BP 132/88  Pulse 78  Temp(Src) 98.6 F (37 C)  Resp 16  Ht 6' 1.5" (1.867 m)  Wt 339 lb 9.6 oz (154.042 kg)  BMI 44.19 kg/m2  SpO2 97%  Body mass index is 44.19 kg/(m^2).   Repeat blood pressure: 130/88  No pedal edema  Assesment/Plan:   1. Diabetes type 2, uncontrolled  The patient's diabetes control is somewhat better but he still has a high A1c See history of present illness for detailed discussion of current regimen, blood sugar patterns, and difficultiesidentified Still having issues as follows:  He has not been monitoring blood sugars frequently and only occasionally after meals   Still not motivated to exercise  Take NovoLog consistently based on carbohydrate intake at each meal and snack  Not adjusting Lantus based on fasting blood sugars, needs to go up at least 2 units and take insulin consistently at the same time  Some of his high A1c readings are related to periodic noncompliance with his medications and insulin because of financial reasons  For now he will increase his Lantus to 36 and  discussed fasting blood sugar targets To start monitoring after meals and adjust his NovoLog if postprandial readings are high Continue metformin He will be enrolled in the assistance program for insulin and appropriate forms were filled out  2. Currently  blood pressure is high and may need to add Norvasc   Farron Watrous 11/08/2013, 4:35 PM

## 2013-11-12 ENCOUNTER — Other Ambulatory Visit (HOSPITAL_BASED_OUTPATIENT_CLINIC_OR_DEPARTMENT_OTHER): Payer: Medicare Other

## 2013-11-12 DIAGNOSIS — D472 Monoclonal gammopathy: Secondary | ICD-10-CM

## 2013-11-12 LAB — CBC WITH DIFFERENTIAL/PLATELET
BASO%: 1.2 % (ref 0.0–2.0)
BASOS ABS: 0 10*3/uL (ref 0.0–0.1)
EOS%: 3.7 % (ref 0.0–7.0)
Eosinophils Absolute: 0.1 10*3/uL (ref 0.0–0.5)
HEMATOCRIT: 37.8 % — AB (ref 38.4–49.9)
HGB: 12.8 g/dL — ABNORMAL LOW (ref 13.0–17.1)
LYMPH%: 9.7 % — ABNORMAL LOW (ref 14.0–49.0)
MCH: 32.2 pg (ref 27.2–33.4)
MCHC: 34 g/dL (ref 32.0–36.0)
MCV: 94.7 fL (ref 79.3–98.0)
MONO#: 0.5 10*3/uL (ref 0.1–0.9)
MONO%: 13.8 % (ref 0.0–14.0)
NEUT#: 2.5 10*3/uL (ref 1.5–6.5)
NEUT%: 71.6 % (ref 39.0–75.0)
Platelets: 252 10*3/uL (ref 140–400)
RBC: 3.99 10*6/uL — ABNORMAL LOW (ref 4.20–5.82)
RDW: 13.7 % (ref 11.0–14.6)
WBC: 3.5 10*3/uL — ABNORMAL LOW (ref 4.0–10.3)
lymph#: 0.3 10*3/uL — ABNORMAL LOW (ref 0.9–3.3)

## 2013-11-12 LAB — COMPREHENSIVE METABOLIC PANEL (CC13)
ALK PHOS: 73 U/L (ref 40–150)
ALT: 19 U/L (ref 0–55)
AST: 21 U/L (ref 5–34)
Albumin: 4.2 g/dL (ref 3.5–5.0)
Anion Gap: 11 mEq/L (ref 3–11)
BILIRUBIN TOTAL: 0.33 mg/dL (ref 0.20–1.20)
BUN: 20.8 mg/dL (ref 7.0–26.0)
CALCIUM: 9.5 mg/dL (ref 8.4–10.4)
CHLORIDE: 106 meq/L (ref 98–109)
CO2: 26 mEq/L (ref 22–29)
CREATININE: 1.2 mg/dL (ref 0.7–1.3)
Glucose: 103 mg/dl (ref 70–140)
Potassium: 4.1 mEq/L (ref 3.5–5.1)
Sodium: 142 mEq/L (ref 136–145)
Total Protein: 7 g/dL (ref 6.4–8.3)

## 2013-11-14 LAB — SPEP & IFE WITH QIG
ALPHA-1-GLOBULIN: 5.2 % — AB (ref 2.9–4.9)
Albumin ELP: 64.6 % (ref 55.8–66.1)
Alpha-2-Globulin: 9.7 % (ref 7.1–11.8)
BETA 2: 4.4 % (ref 3.2–6.5)
Beta Globulin: 4.8 % (ref 4.7–7.2)
Gamma Globulin: 11.3 % (ref 11.1–18.8)
IGA: 23 mg/dL — AB (ref 68–379)
IgG (Immunoglobin G), Serum: 696 mg/dL (ref 650–1600)
IgM, Serum: 170 mg/dL (ref 41–251)
M-Spike, %: 0.26 g/dL
TOTAL PROTEIN, SERUM ELECTROPHOR: 6.6 g/dL (ref 6.0–8.3)

## 2013-11-19 ENCOUNTER — Telehealth: Payer: Self-pay | Admitting: Oncology

## 2013-11-19 ENCOUNTER — Ambulatory Visit (HOSPITAL_BASED_OUTPATIENT_CLINIC_OR_DEPARTMENT_OTHER): Payer: Medicare Other | Admitting: Oncology

## 2013-11-19 ENCOUNTER — Encounter: Payer: Self-pay | Admitting: Oncology

## 2013-11-19 VITALS — BP 146/91 | HR 69 | Temp 97.6°F | Resp 18 | Ht 73.0 in | Wt 341.9 lb

## 2013-11-19 DIAGNOSIS — D649 Anemia, unspecified: Secondary | ICD-10-CM | POA: Diagnosis not present

## 2013-11-19 DIAGNOSIS — C880 Waldenstrom macroglobulinemia: Secondary | ICD-10-CM

## 2013-11-19 DIAGNOSIS — D472 Monoclonal gammopathy: Secondary | ICD-10-CM | POA: Diagnosis not present

## 2013-11-19 DIAGNOSIS — E119 Type 2 diabetes mellitus without complications: Secondary | ICD-10-CM

## 2013-11-19 NOTE — Telephone Encounter (Signed)
gave pt appt for lab and MD for June and July 2015

## 2013-11-19 NOTE — Progress Notes (Signed)
Hematology and Oncology Follow Up Visit  Edward Clarke [id number] [birth year] 48 y.o. 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  He had presented with IgM about 3 g/dL and M-spike about 2 g/dL.  His workup that includes imaging studies, CT scan of the chest, abdomen and pelvis and skeletal survey had been really unrevealing. Bone marrow biopsy confirmed the diagnosis.    Previous therapy: Started chemotherapy with Bendamustine and Rituxan on 02/14/13. He is S/P cycle 6 on 07/04/2013 with CR.   Current therapy: Observation and surveillance.  Interim History:  Edward Clarke presents today for a follow-up visit. Since his last visit he continues to improve. He states his energy level is much better. He reports no dyspnea on exertion. He reports no delayed side effects due to chemotherapy. Denies chest pain. No rashes.  He has not reported any alternation in his mental status. He did not report any neurological symptoms.  Overall, performance status and activity level remained reasonable and continue to improve. He is not reporting any fevers chills or sweats. He is reporting grade 1 arthralgia and myalgias. He reports no hospitalization or illnesses. He has not reported any constitutional symptoms of fevers or chills or sweats. Has not reported any alteration of mental status. The report any confusion or neurological deficits. He still have some vision issues and follows with ophthalmology.  Medications: I have reviewed the patient's current medications.  Current Outpatient Prescriptions  Medication Sig Dispense Refill  . aspirin EC 81 MG tablet Take 81 mg by mouth every morning.      [name] ibuprofen (ADVIL,MOTRIN) 200 MG tablet Take 400-800 mg by mouth every 8 (eight) hours as needed for headache.      . insulin aspart (NOVOLOG FLEXPEN) 100  UNIT/ML FlexPen Inject 5-20 Units into the skin See admin instructions. He uses a sliding scale.  15 mL  3  . Insulin Glargine (LANTUS) 100 UNIT/ML Solostar Pen Inject 34 Units into the skin every morning.      [name] lisinopril (PRINIVIL,ZESTRIL) 40 MG tablet Take 1 tablet (40 mg total) by mouth daily.  30 tablet  3  . metFORMIN (GLUCOPHAGE) 1000 MG tablet Take 1 tablet (1,000 mg total) by mouth 2 (two) times daily.  60 tablet  3  . Multiple Vitamin (MULTIVITAMIN WITH MINERALS) TABS Take 1 tablet by mouth every morning.      . simvastatin (ZOCOR) 40 MG tablet Take 1 tablet (40 mg total) by mouth every morning.  30 tablet  3  . tobramycin (TOBREX) 0.3 % ophthalmic solution Place 1 drop into both eyes See admin instructions. He uses for 2 days prior to eye procedures.       No current facility-administered medications for this visit.     Allergies:  Allergies  Allergen Reactions  . Benazepril Other (See Comments)    Reaction unknown - may have been headaches    Past Medical History, Surgical history, Social history, and Family History were reviewed and updated.  Review of Systems:  Remaining ROS negative.  Physical Exam: Blood pressure 146/91, pulse 69, temperature 97.6 F (36.4 C), temperature source Oral, resp. rate 18, height '6\' 1"'  (1.854 m), weight 341 lb 14.4 oz (155.085 kg), SpO2 97.00%. ECOG: 1 General appearance: alert Head: Normocephalic, without obvious abnormality, atraumatic Neck: no adenopathy, no carotid bruit, no JVD, supple, symmetrical, trachea midline and thyroid not  enlarged, symmetric, no tenderness/mass/nodules Lymph nodes: Cervical, supraclavicular, and axillary nodes normal. Heart:regular rate and rhythm, S1, S2 normal, no murmur, click, rub or gallop Lung:chest clear, no wheezing, rales, normal symmetric air entry Abdomen: soft, non-tender, without masses or organomegaly EXT:no erythema, induration, or nodules   Lab Results: Lab Results  Component Value Date    WBC 3.5* 11/12/2013   HGB 12.8* 11/12/2013   HCT 37.8* 11/12/2013   MCV 94.7 11/12/2013   PLT 252 11/12/2013     Chemistry      Component Value Date/Time   NA 142 11/12/2013 0956   NA 138 07/22/2013 1645   K 4.1 11/12/2013 0956   K 4.2 07/22/2013 1645   CL 104 07/22/2013 1645   CL 101 02/14/2013 0758   CO2 26 11/12/2013 0956   CO2 29 07/22/2013 1645   BUN 20.8 11/12/2013 0956   BUN 9 07/22/2013 1645   CREATININE 1.2 11/12/2013 0956   CREATININE 1.0 07/22/2013 1645      Component Value Date/Time   CALCIUM 9.5 11/12/2013 0956   CALCIUM 9.7 07/22/2013 1645   ALKPHOS 73 11/12/2013 0956   ALKPHOS 51 11/24/2010 1320   AST 21 11/12/2013 0956   AST 18 11/24/2010 1320   ALT 19 11/12/2013 0956   ALT 17 11/24/2010 1320   BILITOT 0.33 11/12/2013 0956   BILITOT 0.4 11/24/2010 1320      Results for Edward Clarke, Edward Clarke (MRN [id number]) as of 11/19/2013 10:01  Ref. Range 07/04/2013 08:09 08/20/2013 10:43 11/12/2013 09:56  IgM, Serum Latest Range: 41-251 mg/dL 383 (H) 286 (H) 170   Results for Edward Clarke, Edward Clarke (MRN [id number]) as of 11/19/2013 10:01  Ref. Range 08/20/2013 10:43 11/12/2013 09:56  M-SPIKE, % No range found 0.37 0.26   Impression and Plan:  This is a pleasant 48 year old gentleman with the following issues: 1. IgM monoclonal paraproteinemia. This is likely due to  lymphoproliferative disorder such as Waldenstrm's or lymphoplasmacytic lymphoma.  He is status post 6 cycles of bendamustine and Rituxan. Therapy has been well tolerated and he had a near complete response with normalization of his IgM level and near disappearance of his M spike. For the time being we'll continue active surveillance and repeat protein studies in 3 months and restart chemotherapy on relapse. 2. Anemia: likely related to #1 and recent chemotherapy.  His hemoglobin has normalized. 3. Hyperviscosity syndrome: This will less likely to happen at this time. 4. Nausea/vomiting prophylaxis: He has Compazine at home. 5.          Diabetes: Followed by his primary care physician. 6. Follow up in 3 months.     Edward Clarke 3/17/201510:10 AM

## 2013-12-12 ENCOUNTER — Telehealth: Payer: Self-pay | Admitting: Endocrinology

## 2013-12-12 DIAGNOSIS — H348192 Central retinal vein occlusion, unspecified eye, stable: Secondary | ICD-10-CM | POA: Diagnosis not present

## 2013-12-12 DIAGNOSIS — E1065 Type 1 diabetes mellitus with hyperglycemia: Secondary | ICD-10-CM | POA: Diagnosis not present

## 2013-12-12 DIAGNOSIS — E1039 Type 1 diabetes mellitus with other diabetic ophthalmic complication: Secondary | ICD-10-CM | POA: Diagnosis not present

## 2013-12-12 DIAGNOSIS — E11349 Type 2 diabetes mellitus with severe nonproliferative diabetic retinopathy without macular edema: Secondary | ICD-10-CM | POA: Diagnosis not present

## 2013-12-12 NOTE — Telephone Encounter (Signed)
No samples, patient is aware

## 2013-12-12 NOTE — Telephone Encounter (Signed)
Pt would like to know if any samples of novolog   Call back: [phone]  Thank You :)

## 2013-12-18 IMAGING — CT CT BIOPSY
1 of 6 series · 14 of 32 positions shown, 19 images · non-contrast
Comparison: none

Clinical Data/Indication: MONOCLONAL GAMMOPATHY

CT-GUIDED BONE MARROW ASPIRATE AND CORE BIOPSY.
Sedation: Versed four mg, Fentanyl 200 mcg.
Total Moderate Sedation Time: 10 minutes.
Procedure: The procedure, risks, benefits, and alternatives were
explained to the patient. Questions regarding the procedure were
encouraged and answered. The patient understands and consents to
the procedure.
The back was prepped with betadine in a sterile fashion, and a
sterile drape was applied covering the operative field. A sterile
gown and sterile gloves were used for the procedure.
Under CT guidance, an 11 gauge needle was inserted into the right
iliac bone via posterior approach.  Aspirates were obtained.  A
core was obtained. Final imaging was performed.
Patient tolerated the procedure well without complication.  Vital
sign monitoring by nursing staff during the procedure will continue
as patient is in the special procedures unit for post procedure
observation.

[Series 2: rtn a/p w/o · axial · non-contrast · 0.64mm/px · z∈[-285,-185]mm · 14 of 24 slices shown, 19 images]
[im 2/24  soft-tissue]
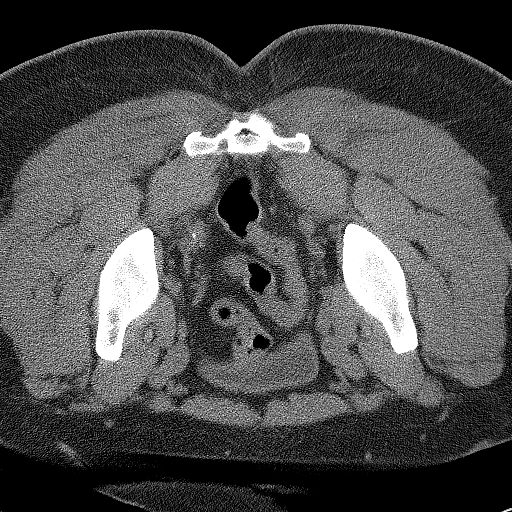
[im 2/24  bone]
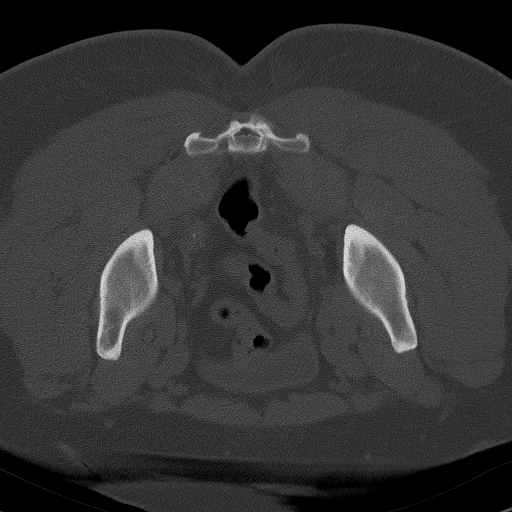
[im 3/24  soft-tissue]
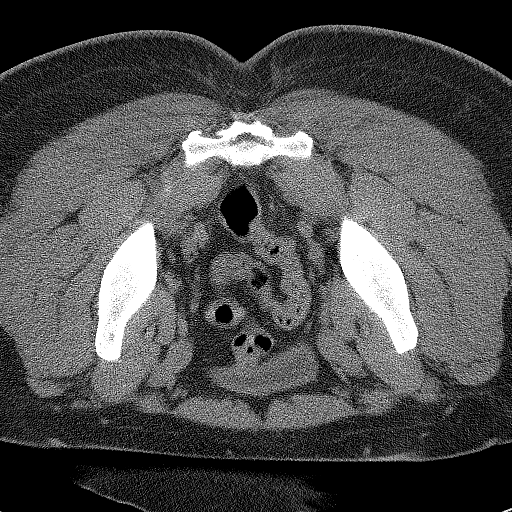
[im 6/24  soft-tissue]
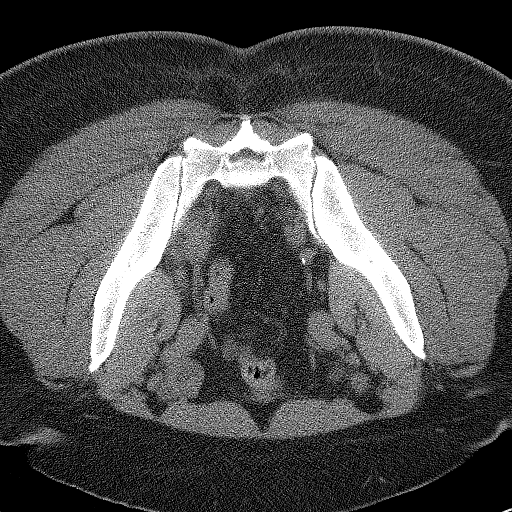
[im 7/24  soft-tissue]
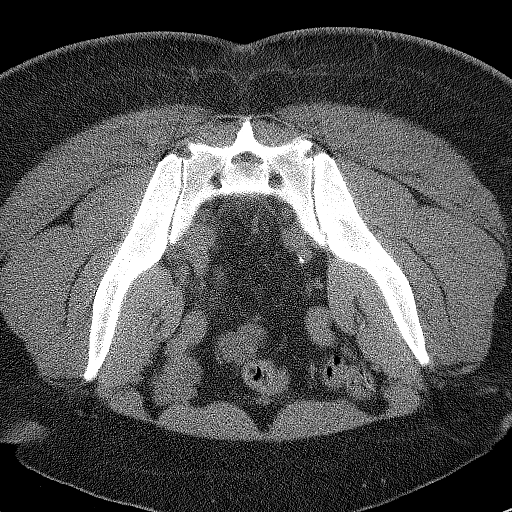
[im 8/24  soft-tissue]
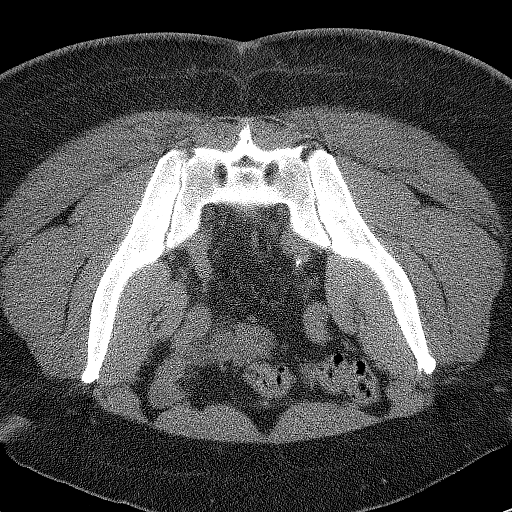
[im 11/24  soft-tissue]
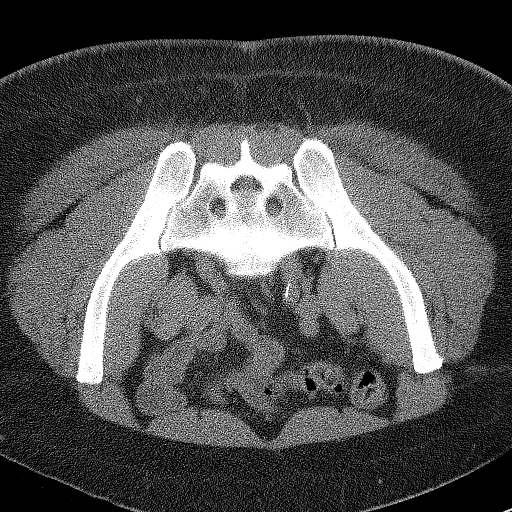
[im 12/24  soft-tissue]
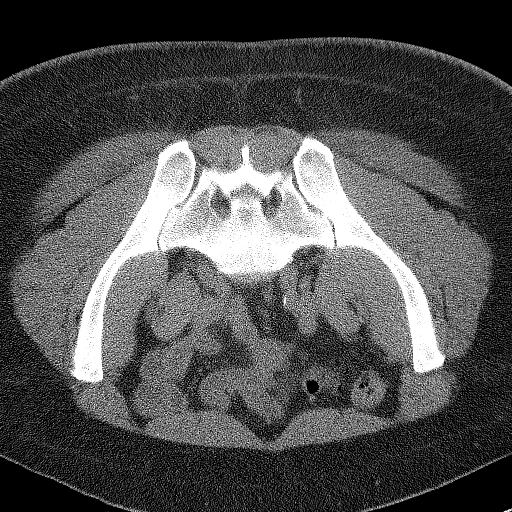
[im 13/24  soft-tissue]
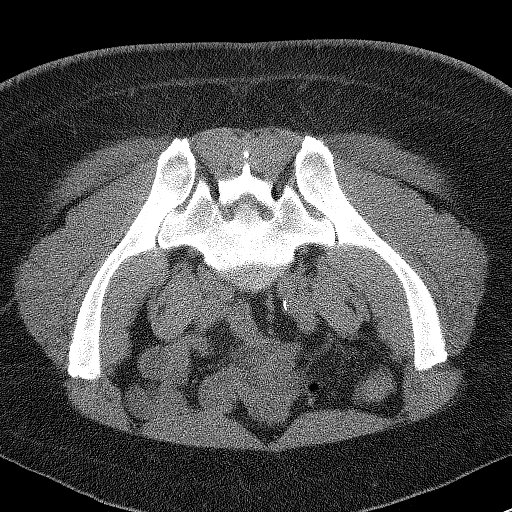
[im 16/24  soft-tissue]
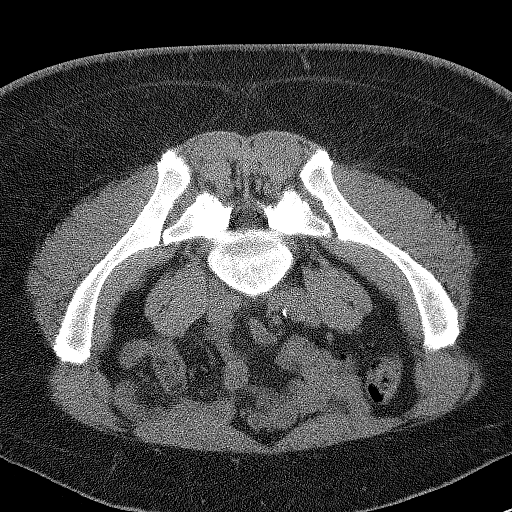
[im 16/24  bone]
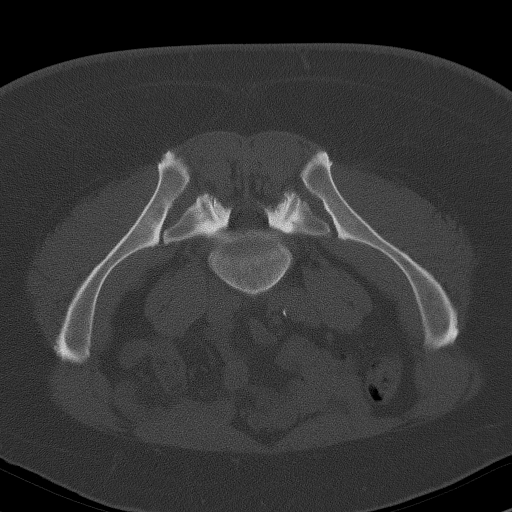
[im 17/24  soft-tissue]
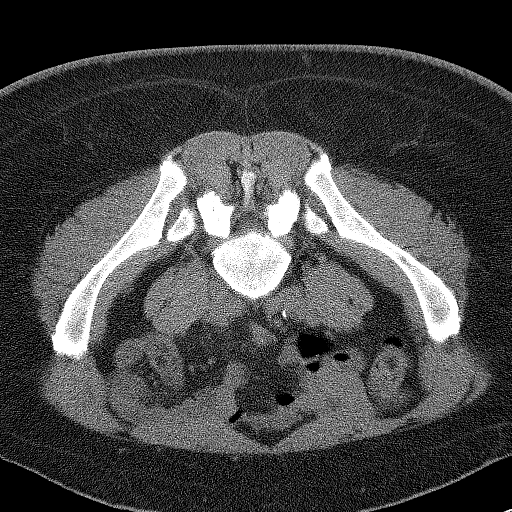
[im 18/24  soft-tissue]
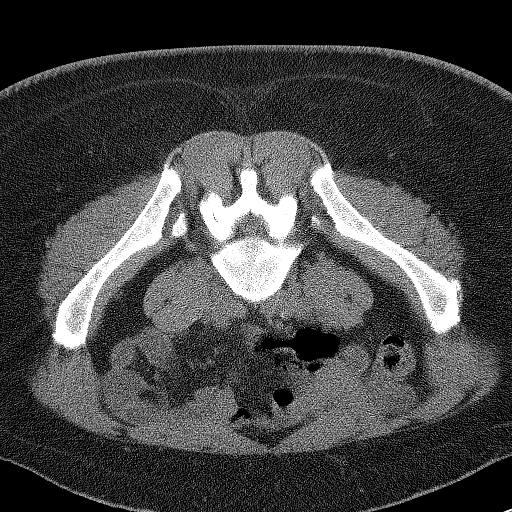
[im 18/24  lung]
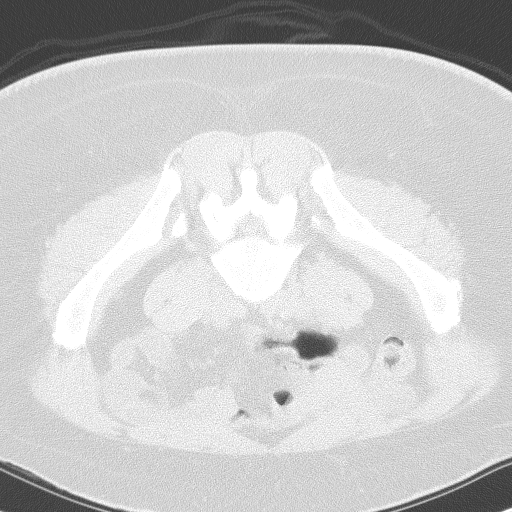
[im 20/24  lung]
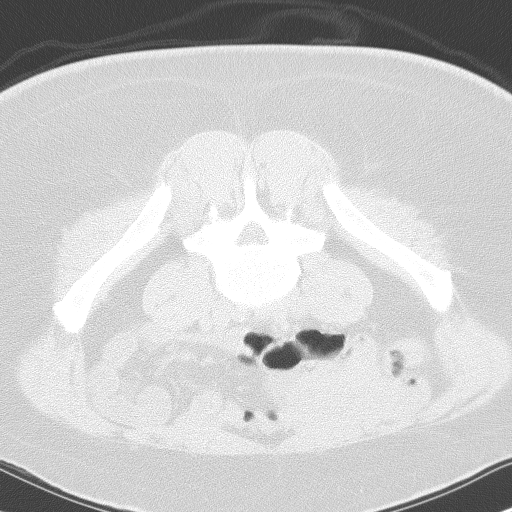
[im 21/24  soft-tissue]
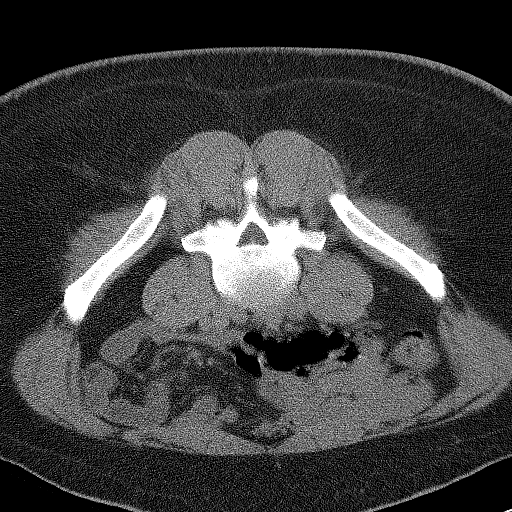
[im 21/24  lung]
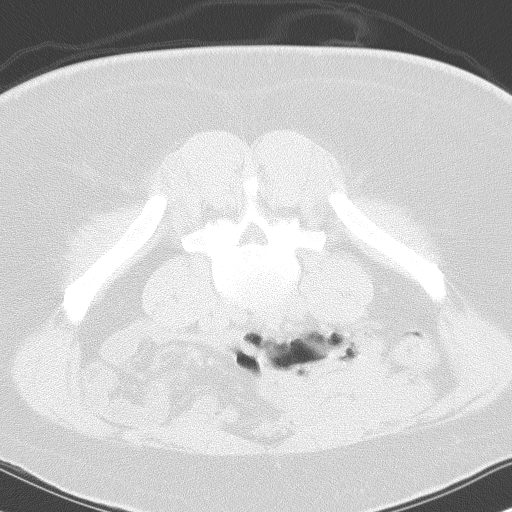
[im 22/24  soft-tissue]
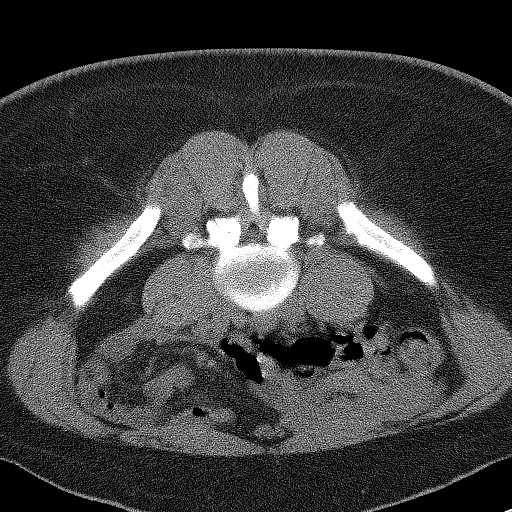
[im 22/24  lung]
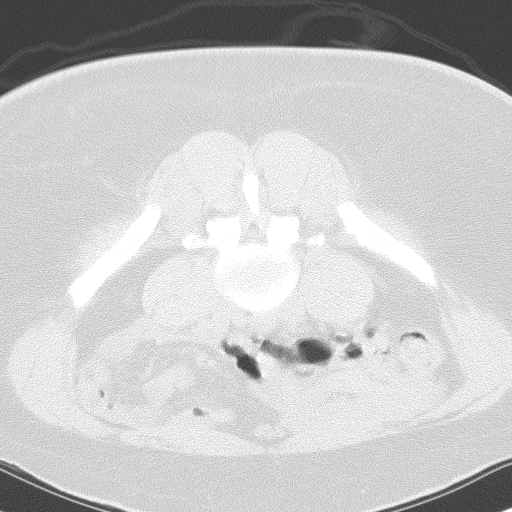

[14 of 32 positions shown; findings below may reference images not displayed]

FINDINGS: The images document guide needle placement within the
right iliac bone.  Post biopsy images demonstrate no hemorrhage.
IMPRESSION: Successful CT-guided bone marrow aspirate and core.

## 2014-02-07 ENCOUNTER — Other Ambulatory Visit (INDEPENDENT_AMBULATORY_CARE_PROVIDER_SITE_OTHER): Payer: Medicare Other

## 2014-02-07 ENCOUNTER — Other Ambulatory Visit: Payer: Medicare Other

## 2014-02-07 DIAGNOSIS — IMO0001 Reserved for inherently not codable concepts without codable children: Secondary | ICD-10-CM | POA: Diagnosis not present

## 2014-02-07 DIAGNOSIS — E1165 Type 2 diabetes mellitus with hyperglycemia: Principal | ICD-10-CM

## 2014-02-07 LAB — HEMOGLOBIN A1C: Hgb A1c MFr Bld: 10.2 % — ABNORMAL HIGH (ref 4.6–6.5)

## 2014-02-07 LAB — BASIC METABOLIC PANEL
BUN: 10 mg/dL (ref 6–23)
CALCIUM: 10.3 mg/dL (ref 8.4–10.5)
CO2: 31 mEq/L (ref 19–32)
Chloride: 101 mEq/L (ref 96–112)
Creatinine, Ser: 1.2 mg/dL (ref 0.4–1.5)
GFR: 83.47 mL/min (ref >60.00)
GLUCOSE: 185 mg/dL — AB (ref 70–99)
Potassium: 4.6 mEq/L (ref 3.5–5.1)
Sodium: 142 mEq/L (ref 135–145)

## 2014-02-10 ENCOUNTER — Other Ambulatory Visit: Payer: Self-pay | Admitting: *Deleted

## 2014-02-10 ENCOUNTER — Encounter: Payer: Self-pay | Admitting: Endocrinology

## 2014-02-10 ENCOUNTER — Ambulatory Visit (INDEPENDENT_AMBULATORY_CARE_PROVIDER_SITE_OTHER): Payer: Medicare Other | Admitting: Endocrinology

## 2014-02-10 VITALS — BP 140/100 | HR 72 | Temp 97.8°F | Resp 14 | Ht 73.5 in | Wt 335.0 lb

## 2014-02-10 DIAGNOSIS — I10 Essential (primary) hypertension: Secondary | ICD-10-CM

## 2014-02-10 DIAGNOSIS — E785 Hyperlipidemia, unspecified: Secondary | ICD-10-CM

## 2014-02-10 DIAGNOSIS — E1165 Type 2 diabetes mellitus with hyperglycemia: Principal | ICD-10-CM

## 2014-02-10 DIAGNOSIS — IMO0001 Reserved for inherently not codable concepts without codable children: Secondary | ICD-10-CM | POA: Diagnosis not present

## 2014-02-10 LAB — GLUCOSE, POCT (MANUAL RESULT ENTRY): POC Glucose: 304 mg/dl — AB (ref 70–99)

## 2014-02-10 MED ORDER — INSULIN ISOPHANE HUMAN 100 UNIT/ML KWIKPEN: PEN_INJECTOR | SUBCUTANEOUS | Status: DC

## 2014-02-10 MED ORDER — INSULIN LISPRO 100 UNIT/ML (KWIKPEN): 10.0000 [IU] | PEN_INJECTOR | Freq: Three times a day (TID) | SUBCUTANEOUS | Status: DC

## 2014-02-10 NOTE — Progress Notes (Signed)
Patient ID: Edward Clarke, male   DOB: 1966/05/11, 48 y.o.   MRN: [id number]   Reason for Appointment: Diabetes follow-up   History of Present Illness    Diagnosis: Type 2 diabetes mellitus, date of diagnosis: 2005.   He has been on basal bolus insulin regimen in the last few years along with metformin Most of the difficulty with control is related to his noncompliance with diet, medications, glucose monitoring and financial issues Previously had been  out of one or the other medication or insulin  and blood sugars will not be well controlled  Recent history: He again has run out of his medications and also NovoLog insulin and blood sugars out of control He has not been able to afford it is Humalog insulin and is getting his Lantus from a relative Also did not fill his metformin even though it is less expensive He has been thirsty with a high sugars and has lost some weight. However he is trying to drink only water A1c has gone up as expected Compliance with exercise also poor and he has no motivation Also has not checked his blood sugar as he did not get his strips recently He says he has no prescription coverage for his medications   INSULIN: Lantus 36 units in the morning before breakfast, previously taking NovoLog before meals, 10-20 units ac Proper timing of medications in relation to meals: Yes   Monitors blood glucose: None recently  Glucometer: One Touch.  Blood Glucose readings: Not done recently   No hypoglycemia recently  Meals: Usually 2 meals a day (supper 5-9 pm)    Carbohydrate intake: variable.  Food preferences: usually low fat, eating out at [place], rarely fast food  Physical activity: exercise: none   Wt Readings from Last 3 Encounters:  02/10/14 335 lb (151.955 kg)  11/19/13 341 lb 14.4 oz (155.085 kg)  11/08/13 339 lb 9.6 oz (154.042 kg)    Retinal exam: Most recent: 2014.   The HbgA1c has been as high as 11.6 previously .  Lab Results  Component  Value Date   HGBA1C 10.2* 02/07/2014   HGBA1C 8.2* 11/04/2013   HGBA1C 9.2* 07/22/2013   Lab Results  Component Value Date   MICROALBUR 1.1 11/04/2013   LDLCALC 99 11/04/2013   CREATININE 1.2 02/07/2014        Medication List       This list is accurate as of: 02/10/14  3:41 PM.  Always use your most recent med list.               aspirin EC 81 MG tablet  Take 81 mg by mouth every morning.     ibuprofen 200 MG tablet  Commonly known as:  ADVIL,MOTRIN  Take 400-800 mg by mouth every 8 (eight) hours as needed for headache.     insulin aspart 100 UNIT/ML FlexPen  Commonly known as:  NOVOLOG FLEXPEN  Inject 5-20 Units into the skin See admin instructions. He uses a sliding scale.     Insulin Glargine 100 UNIT/ML Solostar Pen  Commonly known as:  LANTUS  Inject 34 Units into the skin every morning.     lisinopril 40 MG tablet  Commonly known as:  PRINIVIL,ZESTRIL  Take 1 tablet (40 mg total) by mouth daily.     metFORMIN 1000 MG tablet  Commonly known as:  GLUCOPHAGE  Take 1 tablet (1,000 mg total) by mouth 2 (two) times daily.     multivitamin with minerals Tabs tablet  Take  1 tablet by mouth every morning.     simvastatin 40 MG tablet  Commonly known as:  ZOCOR  Take 1 tablet (40 mg total) by mouth every morning.     tobramycin 0.3 % ophthalmic solution  Commonly known as:  TOBREX  Place 1 drop into both eyes See admin instructions. He uses for 2 days prior to eye procedures.        Allergies:  Allergies  Allergen Reactions  . Benazepril Other (See Comments)    Reaction unknown - may have been headaches    Past Medical History  Diagnosis Date  . Monoclonal paraproteinemia   . Diabetes mellitus   . Hypertension   . Obesity   . Hyperlipidemia     Past Surgical History  Procedure Laterality Date  . Appendectomy    . Eye surgery    . Tonsillectomy      No family history on file.  Social History:  reports that he has quit smoking. His smoking use  included Cigarettes. He smoked 0.00 packs per day. He does not have any smokeless tobacco history on file. He reports that he drinks about 1.2 ounces of alcohol per week. He reports that he uses illicit drugs (Marijuana) about 7 times per week.  Review of Systems:  HYPERTENSION:  has had long-standing hypertension and blood pressure is not adequately controlled. Again has not out of his medication and blood pressure is significantly higher Does not always watch his salt intake but no pedal edema  HYPERLIPIDEMIA:  The lipid abnormality consists of elevated LDL treated with simvastatin. Recently not taking this, previously was compliant with good control  Lab Results  Component Value Date   CHOL 173 11/04/2013   HDL 36.80* 11/04/2013   LDLCALC 99 11/04/2013   TRIG 188.0* 11/04/2013   CHOLHDL 5 11/04/2013    Has had chemotherapy for myeloma   No numbness or tingling in his feet   Examination:   BP 140/100  Pulse 72  Temp(Src) 97.8 F (36.6 C)  Resp 14  Ht 6' 1.5" (1.867 m)  Wt 335 lb (151.955 kg)  BMI 43.59 kg/m2  SpO2 95%  Body mass index is 43.59 kg/(m^2).   No pedal edema  Assesment/Plan:   1. Diabetes type 2, uncontrolled  The patient's diabetes control is poor now with noncompliance with his mealtime insulin and metformin Even though he has been given assistance forms for support from pharmaceutical companies he is not following up on this He is symptomatic with high readings  For now he will continue his Lantus unchanged but have given him a coupon to get free Humalog and Humulin NPH NPH will be started when he runs out of Lantus Discussed how NPH works, need for taking it on waking up in bedtime I advised him on how to adjust the bedtime dose based on fasting blood sugar every 3-4 days For now will empirically start him on 26 in the morning and 14 at night He will probably start off with the same dose of Humalog as NovoLog After his medication runs out he can get supplies  from the pharmaceutical company directly  To start monitoring blood sugars as his strips was recovered, he will need to do some readings after meals also and adjust his NovoLog if postprandial readings are high Restart metformin Encouraged him to start walking regularly He will be enrolled in the assistance program for insulin and appropriate forms were filled out  2. Currently  blood pressure is high and  advised him to start back on lisinopril and call if it is not [facility]4 plan  3. He will will also need to refill his Zocor when able to  Counseling time over 50% of today's 25 minute visit  Elayne Snare 02/10/2014, 3:41 PM

## 2014-02-10 NOTE — Patient Instructions (Addendum)
Walk 20 min daily  Get strips, Please check blood sugars at least half the time about 2 hours after any meal and as directed on waking up. Please bring blood sugar monitor to each visit  Novolin N, 26 units in am waking  and 14 at bedtime   Humalog replaces Novolog  Restart Lisinopril and METFORMIN

## 2014-02-25 ENCOUNTER — Telehealth: Payer: Self-pay | Admitting: Oncology

## 2014-02-25 NOTE — Telephone Encounter (Signed)
returned pt call and r/s appt per pt request...pt aware of new d.t °

## 2014-02-26 ENCOUNTER — Other Ambulatory Visit (HOSPITAL_BASED_OUTPATIENT_CLINIC_OR_DEPARTMENT_OTHER): Payer: Medicare Other

## 2014-02-26 ENCOUNTER — Other Ambulatory Visit: Payer: Medicare Other

## 2014-02-26 DIAGNOSIS — D472 Monoclonal gammopathy: Secondary | ICD-10-CM | POA: Diagnosis not present

## 2014-02-26 LAB — COMPREHENSIVE METABOLIC PANEL (CC13)
ALT: 15 U/L (ref 0–55)
AST: 16 U/L (ref 5–34)
Albumin: 3.9 g/dL (ref 3.5–5.0)
Alkaline Phosphatase: 74 U/L (ref 40–150)
Anion Gap: 8 mEq/L (ref 3–11)
BILIRUBIN TOTAL: 0.39 mg/dL (ref 0.20–1.20)
BUN: 5.9 mg/dL — ABNORMAL LOW (ref 7.0–26.0)
CO2: 28 mEq/L (ref 22–29)
Calcium: 9.2 mg/dL (ref 8.4–10.4)
Chloride: 102 mEq/L (ref 98–109)
Creatinine: 1.2 mg/dL (ref 0.7–1.3)
Glucose: 214 mg/dl — ABNORMAL HIGH (ref 70–140)
Potassium: 3.6 mEq/L (ref 3.5–5.1)
SODIUM: 139 meq/L (ref 136–145)
TOTAL PROTEIN: 6.5 g/dL (ref 6.4–8.3)

## 2014-02-26 LAB — CBC WITH DIFFERENTIAL/PLATELET
BASO%: 0.9 % (ref 0.0–2.0)
BASOS ABS: 0 10*3/uL (ref 0.0–0.1)
EOS%: 3.6 % (ref 0.0–7.0)
Eosinophils Absolute: 0.1 10*3/uL (ref 0.0–0.5)
HCT: 36.3 % — ABNORMAL LOW (ref 38.4–49.9)
HGB: 12.6 g/dL — ABNORMAL LOW (ref 13.0–17.1)
LYMPH%: 18.3 % (ref 14.0–49.0)
MCH: 30.8 pg (ref 27.2–33.4)
MCHC: 34.7 g/dL (ref 32.0–36.0)
MCV: 88.8 fL (ref 79.3–98.0)
MONO#: 0.2 10*3/uL (ref 0.1–0.9)
MONO%: 10.3 % (ref 0.0–14.0)
NEUT#: 1.5 10*3/uL (ref 1.5–6.5)
NEUT%: 66.9 % (ref 39.0–75.0)
NRBC: 0 % (ref 0–0)
Platelets: 217 10*3/uL (ref 140–400)
RBC: 4.09 10*6/uL — AB (ref 4.20–5.82)
RDW: 12.9 % (ref 11.0–14.6)
WBC: 2.2 10*3/uL — ABNORMAL LOW (ref 4.0–10.3)
lymph#: 0.4 10*3/uL — ABNORMAL LOW (ref 0.9–3.3)

## 2014-02-28 LAB — KAPPA/LAMBDA LIGHT CHAINS
Kappa free light chain: 1.52 mg/dL (ref 0.33–1.94)
Kappa:Lambda Ratio: 1.77 — ABNORMAL HIGH (ref 0.26–1.65)
Lambda Free Lght Chn: 0.86 mg/dL (ref 0.57–2.63)

## 2014-02-28 LAB — SPEP & IFE WITH QIG
Albumin ELP: 64.3 % (ref 55.8–66.1)
Alpha-1-Globulin: 8.4 % — ABNORMAL HIGH (ref 2.9–4.9)
Alpha-2-Globulin: 9.1 % (ref 7.1–11.8)
BETA GLOBULIN: 4.9 % (ref 4.7–7.2)
Beta 2: 3.2 % (ref 3.2–6.5)
GAMMA GLOBULIN: 10.1 % — AB (ref 11.1–18.8)
IGA: 26 mg/dL — AB (ref 68–379)
IGG (IMMUNOGLOBIN G), SERUM: 727 mg/dL (ref 650–1600)
IGM, SERUM: 146 mg/dL (ref 41–251)
M-SPIKE, %: 0.22 g/dL
Total Protein, Serum Electrophoresis: 6.3 g/dL (ref 6.0–8.3)

## 2014-03-04 ENCOUNTER — Telehealth: Payer: Self-pay | Admitting: Oncology

## 2014-03-04 NOTE — Telephone Encounter (Signed)
s.w pt and confirmed appt. °

## 2014-03-05 ENCOUNTER — Ambulatory Visit: Payer: Medicare Other | Admitting: Oncology

## 2014-03-06 ENCOUNTER — Ambulatory Visit (HOSPITAL_BASED_OUTPATIENT_CLINIC_OR_DEPARTMENT_OTHER): Payer: Medicare Other | Admitting: Oncology

## 2014-03-06 ENCOUNTER — Encounter: Payer: Self-pay | Admitting: Oncology

## 2014-03-06 VITALS — BP 152/87 | HR 78 | Temp 97.8°F | Resp 18 | Ht 73.5 in | Wt 334.8 lb

## 2014-03-06 DIAGNOSIS — D47Z9 Other specified neoplasms of uncertain behavior of lymphoid, hematopoietic and related tissue: Secondary | ICD-10-CM | POA: Diagnosis not present

## 2014-03-06 DIAGNOSIS — I10 Essential (primary) hypertension: Secondary | ICD-10-CM

## 2014-03-06 DIAGNOSIS — D472 Monoclonal gammopathy: Secondary | ICD-10-CM

## 2014-03-06 NOTE — Progress Notes (Signed)
Hematology and Oncology Follow Up Visit  Edward Clarke [id number] August 13, 1966 48 y.o. 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  He had presented with IgM about 3 g/dL and M-spike about 2 g/dL.  His workup that includes imaging studies, CT scan of the chest, abdomen and pelvis and skeletal survey had been really unrevealing. Bone marrow biopsy confirmed the diagnosis.    Previous therapy: Started chemotherapy with Bendamustine and Rituxan on 02/14/13. He is S/P cycle 6 on 07/04/2013 with CR.   Current therapy: Observation and surveillance.  Interim History:  Mr. Achor presents today for a follow-up visit by him self. Since his last visit, he is not reporting any new symptoms. He states his energy level is back to normal. He reports no dyspnea on exertion. He reports no delayed side effects due to chemotherapy. Denies chest pain. No rashes.  He has not reported any alternation in his mental status. He did not report any neurological symptoms.  Overall, performance status and activity level is back to baseline. He is not reporting any fevers chills or sweats. He reports no hospitalization or illnesses. He has not reported any constitutional symptoms of fevers or chills or sweats. Has not reported any alteration of mental status. The report any confusion or neurological deficits. Rest of his review of system is unremarkable.  Medications: I have reviewed the patient's current medications.  Current Outpatient Prescriptions  Medication Sig Dispense Refill  . aspirin EC 81 MG tablet Take 81 mg by mouth every morning.      [name] ibuprofen (ADVIL,MOTRIN) 200 MG tablet Take 400-800 mg by mouth every 8 (eight) hours as needed for headache.      . insulin aspart (NOVOLOG FLEXPEN) 100 UNIT/ML FlexPen Inject 5-20 Units into the skin See admin  instructions. He uses a sliding scale.  15 mL  3  . Insulin Glargine (LANTUS) 100 UNIT/ML Solostar Pen Inject 34 Units into the skin every morning.      . insulin lispro (HUMALOG) 100 UNIT/ML KiwkPen Inject 0.1-0.2 mLs (10-20 Units total) into the skin 3 (three) times daily.  15 mL  1  . Insulin NPH, Human,, Isophane, (HUMULIN N) 100 UNIT/ML Kiwkpen Inject 26 units in the morning and 14 units at night  15 mL  3  . lisinopril (PRINIVIL,ZESTRIL) 40 MG tablet Take 1 tablet (40 mg total) by mouth daily.  30 tablet  3  . metFORMIN (GLUCOPHAGE) 1000 MG tablet Take 1 tablet (1,000 mg total) by mouth 2 (two) times daily.  60 tablet  3  . Multiple Vitamin (MULTIVITAMIN WITH MINERALS) TABS Take 1 tablet by mouth every morning.      . simvastatin (ZOCOR) 40 MG tablet Take 1 tablet (40 mg total) by mouth every morning.  30 tablet  3  . tobramycin (TOBREX) 0.3 % ophthalmic solution Place 1 drop into both eyes See admin instructions. He uses for 2 days prior to eye procedures.       No current facility-administered medications for this visit.     Allergies:  Allergies  Allergen Reactions  . Benazepril Other (See Comments)    Reaction unknown - may have been headaches    Past Medical History, Surgical history, Social history, and Family History were reviewed and updated.  Review of Systems:  Remaining ROS negative.  Physical Exam: Blood pressure 152/87, pulse 78, temperature 97.8 F (  36.6 C), temperature source Oral, resp. rate 18, height 6' 1.5" (1.867 m), weight 334 lb 12.8 oz (151.864 kg). ECOG: 1 General appearance: alert Head: Normocephalic, without obvious abnormality, atraumatic Neck: no adenopathy Lymph nodes: Cervical, supraclavicular, and axillary nodes normal. Heart:regular rate and rhythm, S1, S2 normal, no murmur, click, rub or gallop Lung:chest clear, no wheezing, rales, normal symmetric air entry Abdomen: soft, non-tender, without masses or organomegaly EXT:no erythema,  induration, or nodules   Lab Results: Lab Results  Component Value Date   WBC 2.2* 02/26/2014   HGB 12.6* 02/26/2014   HCT 36.3* 02/26/2014   MCV 88.8 02/26/2014   PLT 217 02/26/2014     Chemistry      Component Value Date/Time   NA 139 02/26/2014 1509   NA 142 02/07/2014 1455   K 3.6 02/26/2014 1509   K 4.6 02/07/2014 1455   CL 101 02/07/2014 1455   CL 101 02/14/2013 0758   CO2 28 02/26/2014 1509   CO2 31 02/07/2014 1455   BUN 5.9* 02/26/2014 1509   BUN 10 02/07/2014 1455   CREATININE 1.2 02/26/2014 1509   CREATININE 1.2 02/07/2014 1455      Component Value Date/Time   CALCIUM 9.2 02/26/2014 1509   CALCIUM 10.3 02/07/2014 1455   ALKPHOS 74 02/26/2014 1509   ALKPHOS 51 11/24/2010 1320   AST 16 02/26/2014 1509   AST 18 11/24/2010 1320   ALT 15 02/26/2014 1509   ALT 17 11/24/2010 1320   BILITOT 0.39 02/26/2014 1509   BILITOT 0.4 11/24/2010 1320      Results for PAOLO, OKANE (MRN [id number]) as of 03/06/2014 13:13  Ref. Range 08/20/2013 10:43 11/12/2013 09:56 02/26/2014 15:09  M-SPIKE, % No range found 0.37 0.26 0.22  Results for TIDUS, UPCHURCH (MRN [id number]) as of 03/06/2014 13:13  Ref. Range 11/12/2013 09:56 02/26/2014 15:09  IgG (Immunoglobin G), Serum Latest Range: [phone] mg/dL [phone]  IgA Latest Range: 68-379 mg/dL 23 (L) 26 (L)  IgM, Serum Latest Range: 41-251 mg/dL 170 146    Impression and Plan:  This is a 48 year old gentleman with the following issues: 1. IgM monoclonal paraproteinemia. This is due to  lymphoproliferative disorder such as Waldenstrm's or lymphoplasmacytic lymphoma.  He is status post 6 cycles of bendamustine and Rituxan. Therapy has been well tolerated and he had a near complete response with normalization of his IgM level and near disappearance of his M spike. For the time being we'll continue active surveillance and repeat protein studies in 4 months and restart chemotherapy on relapse. 2. Anemia: likely related to #1 and recent chemotherapy.  His hemoglobin has  normalized. 3. Hyperviscosity syndrome: This will less likely to happen at this time. He has no clinical signs of symptoms at this time. 4. Follow up in 4 months.     [facility] 7/2/20151:29 PM

## 2014-03-10 ENCOUNTER — Telehealth: Payer: Self-pay | Admitting: Oncology

## 2014-03-10 NOTE — Telephone Encounter (Signed)
Lft msg for pt per 03/06/14 P[facility] Labs/11/03 OV 11/10, also mailed ltr KJ

## 2014-03-22 ENCOUNTER — Other Ambulatory Visit: Payer: Self-pay | Admitting: Endocrinology

## 2014-03-24 ENCOUNTER — Other Ambulatory Visit: Payer: Self-pay | Admitting: *Deleted

## 2014-03-24 MED ORDER — SIMVASTATIN 40 MG PO TABS: ORAL_TABLET | ORAL | Status: DC

## 2014-04-01 ENCOUNTER — Telehealth: Payer: Self-pay | Admitting: Endocrinology

## 2014-04-01 NOTE — Telephone Encounter (Signed)
Pt needs call back regarding progam for the humalog

## 2014-04-11 ENCOUNTER — Other Ambulatory Visit (INDEPENDENT_AMBULATORY_CARE_PROVIDER_SITE_OTHER): Payer: Medicare Other

## 2014-04-11 DIAGNOSIS — IMO0001 Reserved for inherently not codable concepts without codable children: Secondary | ICD-10-CM

## 2014-04-11 DIAGNOSIS — E1165 Type 2 diabetes mellitus with hyperglycemia: Principal | ICD-10-CM

## 2014-04-11 LAB — BASIC METABOLIC PANEL
BUN: 13 mg/dL (ref 6–23)
CALCIUM: 9.3 mg/dL (ref 8.4–10.5)
CO2: 27 meq/L (ref 19–32)
CREATININE: 1.1 mg/dL (ref 0.4–1.5)
Chloride: 99 mEq/L (ref 96–112)
GFR: 92.31 mL/min (ref >60.00)
Glucose, Bld: 234 mg/dL — ABNORMAL HIGH (ref 70–99)
Potassium: 4 mEq/L (ref 3.5–5.1)
SODIUM: 133 meq/L — AB (ref 135–145)

## 2014-04-11 LAB — LDL CHOLESTEROL, DIRECT: Direct LDL: 185.5 mg/dL

## 2014-04-14 ENCOUNTER — Ambulatory Visit (INDEPENDENT_AMBULATORY_CARE_PROVIDER_SITE_OTHER): Payer: Medicare Other | Admitting: Endocrinology

## 2014-04-14 ENCOUNTER — Encounter: Payer: Self-pay | Admitting: Endocrinology

## 2014-04-14 ENCOUNTER — Encounter: Payer: Medicare Other | Attending: Endocrinology | Admitting: Nutrition

## 2014-04-14 VITALS — BP 148/90 | HR 64 | Temp 98.2°F | Resp 16 | Ht 73.5 in | Wt 338.2 lb

## 2014-04-14 DIAGNOSIS — I10 Essential (primary) hypertension: Secondary | ICD-10-CM

## 2014-04-14 DIAGNOSIS — E785 Hyperlipidemia, unspecified: Secondary | ICD-10-CM

## 2014-04-14 DIAGNOSIS — IMO0001 Reserved for inherently not codable concepts without codable children: Secondary | ICD-10-CM

## 2014-04-14 DIAGNOSIS — E1165 Type 2 diabetes mellitus with hyperglycemia: Secondary | ICD-10-CM

## 2014-04-14 DIAGNOSIS — Z713 Dietary counseling and surveillance: Secondary | ICD-10-CM | POA: Insufficient documentation

## 2014-04-14 DIAGNOSIS — Z794 Long term (current) use of insulin: Secondary | ICD-10-CM | POA: Diagnosis not present

## 2014-04-14 LAB — FRUCTOSAMINE: Fructosamine: 405 umol/L — ABNORMAL HIGH (ref 190–270)

## 2014-04-14 LAB — GLUCOSE, POCT (MANUAL RESULT ENTRY): POC GLUCOSE: 348 mg/dL — AB (ref 70–99)

## 2014-04-14 NOTE — Patient Instructions (Signed)
Walk daily Please check blood sugars at least half the time about 2 hours after any meal and 3 times per week on waking up.  Please bring blood sugar monitor to each visit  Restart all Meds Increase pm Levemir to 22 units  Start VICTOZA injection with the pen once daily at the same time of the day.  Dial the dose to 0.6 mg for the first week.  You may possibly experience nausea in the first few days which usually gets better in a couple of days After 1 week increase the dose to 1.2mg  daily if no nausea present.  You may inject in the stomach, thigh or arm.   You will feel fullness of the stomach with starting the medication and should try to keep portions of food small.  Call us or the [facility] helpline at [phone] or visit [email or web address] for any questions

## 2014-04-14 NOTE — Patient Instructions (Addendum)
Increase the evening dose of Levemir by 2u every 4 days until the fasting blood sugar drops below 130. Subtract 100 from your blood sugar, divide that number by 10, and that is the correction dose of Novolog that you take, plus your meal time insulin dose of Novolog.  Take 0.6 Victoza once a day for 7 days.  If no nausea, increase the dose to 1.2.

## 2014-04-14 NOTE — Progress Notes (Signed)
Patient and his wife were instructed on the use of the Victoza pen.  We discussed the frequency, side effects and how to adjust the dose.  He had no questions, and reported good understanding of this. He was given a starter kit with this information as well.  He promised to start this as soon as he hears from [employer], if he can get financial assistance.  We also discussed how to adjust his levemir insulin.  He says that his machine is never charged, and so he can not get a reading some times.  He was given a Verio meter to use, since this is battery powered.  He can use either one, and was told to bring in both meters at his next visit, if he is using both of them.  He agreed to do this.  He was told to increase his PM dose of Levemir by 2u q 3 days, until his FBSs come down below 130.  He reported good understanding of this.   We also discussed the new injection sites, and the need to rotate those sites.  He says he is getting insulin leaking back from the sites of injection, and that it is sometimes hard to inject the medication under his skin.  He is not changing his needles at all.  He is using one needle for the use of the pen, and leaving it on the pen all of the time.  I explained that this can cause the insulin/blood whatever, to clog the needle, and encouraged him to use a new needle each injection.  He was given some pen needles to use for this.    We also discussed how to adjust the Novolog dose for meal time coverage.  We reviewed what carbs were, and the need to take more meal time insulin for meals that have more carbs in them.  He also questioned how to do a correction dose.  He says that Dr. Dwyane Dee said that he is 1u for 10 point blood sugar rise, but did not know how to do this.  We reviewed how to do this, and he was given written instructions for how to do a correction dose.    They had no final questions.

## 2014-04-14 NOTE — Progress Notes (Signed)
Patient ID: Edward Clarke, male   DOB: October 28, 1965, 48 y.o.   MRN: [id number]   Reason for Appointment: Diabetes follow-up   History of Present Illness    Diagnosis: Type 2 diabetes mellitus, date of diagnosis: 2005.   He has been on basal bolus insulin regimen in the last few years along with metformin Most of the difficulty with control is related to his noncompliance with diet, medications, glucose monitoring and financial issues Previously had been  out of one or the other medication or insulin  and blood sugars will not be well controlled  Recent history: He has again has been inconsistent with his medications He was going to get NPH insulin to replace his Lantus However he has not been able to get Levemir from the company indigent program; he has misunderstood the instructions for the dose and is taking the dose that was supposed to be for NPH However even with the higher dose of basal insulin his blood sugars are out of control including fasting Not clear what his postprandial readings are since he does not monitor after meals He continues to be noncompliant and not motivated to check his blood sugar even though he knows that he can get his test strips on Medicare Last A1c was 10.2 Also did not fill his metformin even though it is less expensive Compliance with exercise is also poor and he has no motivation, has been reminded to start walking on each visit  INSULIN: Levemir 26 units--16 units p.m.,  taking NovoLog before meals, 15-20 units ac Proper timing of medications in relation to meals: Yes   Monitors blood glucose: Minimal recently  Glucometer: One Touch.  Blood Glucose readings:  done recently, 210 in am a few days ago  No hypoglycemia recently  Meals: Usually 2 meals a day (supper 5-9 pm)    Carbohydrate intake: not controlled.  Food preferences: High carbohydrate and high fat, eating out at [facility] sometimes but not controlling portions  Physical activity: exercise:  minimal  Wt Readings from Last 3 Encounters:  04/14/14 338 lb 3.2 oz (153.407 kg)  03/06/14 334 lb 12.8 oz (151.864 kg)  02/10/14 335 lb (151.955 kg)    Retinal exam: Most recent: 2014.   The HbgA1c has been as high as 11.6 previously .  Lab Results  Component Value Date   HGBA1C 10.2* 02/07/2014   HGBA1C 8.2* 11/04/2013   HGBA1C 9.2* 07/22/2013   Lab Results  Component Value Date   MICROALBUR 1.1 11/04/2013   LDLCALC 99 11/04/2013   CREATININE 1.1 04/11/2014        Medication List       This list is accurate as of: 04/14/14  8:39 AM.  Always use your most recent med list.               aspirin EC 81 MG tablet  Take 81 mg by mouth every morning.     ibuprofen 200 MG tablet  Commonly known as:  ADVIL,MOTRIN  Take 400-800 mg by mouth every 8 (eight) hours as needed for headache.     insulin aspart 100 UNIT/ML FlexPen  Commonly known as:  NOVOLOG FLEXPEN  Inject 5-20 Units into the skin See admin instructions. He uses a sliding scale.     Insulin Glargine 100 UNIT/ML Solostar Pen  Commonly known as:  LANTUS  Inject 34 Units into the skin every morning.     insulin lispro 100 UNIT/ML KiwkPen  Commonly known as:  HUMALOG  Inject 0.1-0.2 mLs (  10-20 Units total) into the skin 3 (three) times daily.     Insulin NPH (Human) (Isophane) 100 UNIT/ML Kiwkpen  Commonly known as:  HUMULIN N  Inject 26 units in the morning and 14 units at night     lisinopril 40 MG tablet  Commonly known as:  PRINIVIL,ZESTRIL  TAKE ONE TABLET BY MOUTH ONCE DAILY     metFORMIN 1000 MG tablet  Commonly known as:  GLUCOPHAGE  TAKE ONE TABLET BY MOUTH TWICE DAILY     multivitamin with minerals Tabs tablet  Take 1 tablet by mouth every morning.     simvastatin 40 MG tablet  Commonly known as:  ZOCOR  Take one tablet by mouth at NIGHT.     tobramycin 0.3 % ophthalmic solution  Commonly known as:  TOBREX  Place 1 drop into both eyes See admin instructions. He uses for 2 days prior to eye  procedures.        Allergies:  Allergies  Allergen Reactions  . Benazepril Other (See Comments)    Reaction unknown - may have been headaches    Past Medical History  Diagnosis Date  . Monoclonal paraproteinemia   . Diabetes mellitus   . Hypertension   . Obesity   . Hyperlipidemia     Past Surgical History  Procedure Laterality Date  . Appendectomy    . Eye surgery    . Tonsillectomy      No family history on file.  Social History:  reports that he has quit smoking. His smoking use included Cigarettes. He smoked 0.00 packs per day. He does not have any smokeless tobacco history on file. He reports that he drinks about 1.2 ounces of alcohol per week. He reports that he uses illicit drugs (Marijuana) about 7 times per week.  Review of Systems:  HYPERTENSION:  has had long-standing hypertension and blood pressure is not adequately controlled.  Again has not taken his lisinopril because of cost and blood pressure is significantly higher  HYPERLIPIDEMIA:  The lipid abnormality consists of elevated LDL treated with simvastatin.  Again not taking this, previously was compliant with good control   Lab Results  Component Value Date   CHOL 173 11/04/2013   HDL 36.80* 11/04/2013   LDLCALC 99 11/04/2013   LDLDIRECT 185.5 04/11/2014   TRIG 188.0* 11/04/2013   CHOLHDL 5 11/04/2013    Has had chemotherapy for myeloma     Examination:   BP 148/90  Pulse 64  Temp(Src) 98.2 F (36.8 C)  Resp 16  Ht 6' 1.5" (1.867 m)  Wt 338 lb 3.2 oz (153.407 kg)  BMI 44.01 kg/m2  SpO2 96%  Body mass index is 44.01 kg/(m^2).   No pedal edema  Assesment/Plan:   1. Diabetes type 2, uncontrolled  The patient's diabetes control is poor again with noncompliance with self-care and metformin Even though he has started back on his insulin with Levemir and NovoLog his blood sugars appear to be significantly high including in the morning Not clear if he is completely compliant with all his insulin  doses See history of present illness for discussion on current problems He has difficulty losing weight He appears to be responding less well to Levemir compared to Lantus; also may be benefiting from metformin when he takes it regularly Since his main difficulty has been with consistent compliance with diet and weight loss he is a good candidate for Victoza which he has not tried before. He should be able to get this from  the manufacturer now Discussed with the patient the nature of GLP-1 drugs, the action on various organ systems, how they benefit blood glucose control, as well as the benefit of weight loss and  increase satiety . Explained possible side effects especially nausea and vomiting; discussed safety information in package insert. Described injection technique and dosage titration of Victoza  starting with 0.6 mg once a day at the same time for the first week and then increasing to 1.2 mg if no symptoms of nausea. Patient brochure on Victoza and co-pay card given  Today he will be discussing his regimen with the nurse educator and also his wife will be present to help him understand and remember what he needs to do He needs to start checking his blood sugar consistently Restart metformin  Recommendations made as in patient instructions  Patient Instructions  Walk daily Please check blood sugars at least half the time about 2 hours after any meal and 3 times per week on waking up.  Please bring blood sugar monitor to each visit  Restart all Meds Increase pm Levemir to 22 units  Start VICTOZA injection with the pen once daily at the same time of the day.  Dial the dose to 0.6 mg for the first week.  You may possibly experience nausea in the first few days which usually gets better in a couple of days After 1 week increase the dose to 1.2mg  daily if no nausea present.  You may inject in the stomach, thigh or arm.   You will feel fullness of the stomach with starting the medication and  should try to keep portions of food small.  Call us or the [place] helpline at [phone] or visit [email or web address] for any questions      2. Currently  blood pressure is high and advised him to start back on lisinopril   3. He will will also need to refill his Zocor    Counseling time over 50% of today's 25 minute visit  Makyla Bye 04/14/2014, 8:39 AM

## 2014-04-24 DIAGNOSIS — E11349 Type 2 diabetes mellitus with severe nonproliferative diabetic retinopathy without macular edema: Secondary | ICD-10-CM | POA: Diagnosis not present

## 2014-04-24 DIAGNOSIS — E1165 Type 2 diabetes mellitus with hyperglycemia: Secondary | ICD-10-CM | POA: Diagnosis not present

## 2014-04-24 DIAGNOSIS — H348192 Central retinal vein occlusion, unspecified eye, stable: Secondary | ICD-10-CM | POA: Diagnosis not present

## 2014-04-24 DIAGNOSIS — H3581 Retinal edema: Secondary | ICD-10-CM | POA: Diagnosis not present

## 2014-04-24 DIAGNOSIS — E1139 Type 2 diabetes mellitus with other diabetic ophthalmic complication: Secondary | ICD-10-CM | POA: Diagnosis not present

## 2014-04-29 ENCOUNTER — Other Ambulatory Visit: Payer: Self-pay | Admitting: *Deleted

## 2014-04-29 MED ORDER — METFORMIN HCL 1000 MG PO TABS: ORAL_TABLET | ORAL | Status: DC

## 2014-04-29 MED ORDER — SIMVASTATIN 40 MG PO TABS: ORAL_TABLET | ORAL | Status: DC

## 2014-04-29 MED ORDER — LISINOPRIL 40 MG PO TABS: ORAL_TABLET | ORAL | Status: DC

## 2014-05-08 ENCOUNTER — Telehealth: Payer: Self-pay | Admitting: Endocrinology

## 2014-05-08 ENCOUNTER — Other Ambulatory Visit: Payer: Self-pay | Admitting: *Deleted

## 2014-05-08 MED ORDER — GLUCOSE BLOOD VI STRP: ORAL_STRIP | Status: DC

## 2014-05-08 NOTE — Telephone Encounter (Addendum)
Patient need prescription fax to pharmacy for Verio one touch test strips in order for insurance to cover it,

## 2014-05-08 NOTE — Telephone Encounter (Signed)
Lm for patient to call back and let me know exactly what kind of test strips he needs

## 2014-05-21 ENCOUNTER — Other Ambulatory Visit (INDEPENDENT_AMBULATORY_CARE_PROVIDER_SITE_OTHER): Payer: Medicare Other

## 2014-05-21 DIAGNOSIS — IMO0001 Reserved for inherently not codable concepts without codable children: Secondary | ICD-10-CM

## 2014-05-21 DIAGNOSIS — E1165 Type 2 diabetes mellitus with hyperglycemia: Principal | ICD-10-CM

## 2014-05-21 LAB — HEMOGLOBIN A1C: HEMOGLOBIN A1C: 8.5 % — AB (ref 4.6–6.5)

## 2014-05-22 LAB — BASIC METABOLIC PANEL
BUN: 11 mg/dL (ref 6–23)
CALCIUM: 9.4 mg/dL (ref 8.4–10.5)
CO2: 25 mEq/L (ref 19–32)
CREATININE: 1.4 mg/dL (ref 0.4–1.5)
Chloride: 104 mEq/L (ref 96–112)
GFR: 68 mL/min (ref >60.00)
Glucose, Bld: 98 mg/dL (ref 70–99)
Potassium: 4.4 mEq/L (ref 3.5–5.1)
SODIUM: 138 meq/L (ref 135–145)

## 2014-05-26 ENCOUNTER — Ambulatory Visit: Payer: Medicare Other | Admitting: Endocrinology

## 2014-05-29 DIAGNOSIS — E1165 Type 2 diabetes mellitus with hyperglycemia: Secondary | ICD-10-CM | POA: Diagnosis not present

## 2014-05-29 DIAGNOSIS — E11339 Type 2 diabetes mellitus with moderate nonproliferative diabetic retinopathy without macular edema: Secondary | ICD-10-CM | POA: Diagnosis not present

## 2014-05-29 DIAGNOSIS — E1139 Type 2 diabetes mellitus with other diabetic ophthalmic complication: Secondary | ICD-10-CM | POA: Diagnosis not present

## 2014-05-29 DIAGNOSIS — E11311 Type 2 diabetes mellitus with unspecified diabetic retinopathy with macular edema: Secondary | ICD-10-CM | POA: Diagnosis not present

## 2014-06-10 ENCOUNTER — Encounter: Payer: Self-pay | Admitting: Endocrinology

## 2014-06-10 ENCOUNTER — Ambulatory Visit (INDEPENDENT_AMBULATORY_CARE_PROVIDER_SITE_OTHER): Payer: Medicare Other | Admitting: Endocrinology

## 2014-06-10 ENCOUNTER — Ambulatory Visit: Payer: Medicare Other | Admitting: Endocrinology

## 2014-06-10 ENCOUNTER — Other Ambulatory Visit: Payer: Self-pay | Admitting: *Deleted

## 2014-06-10 VITALS — BP 139/90 | HR 66 | Temp 97.7°F | Resp 16 | Ht 73.5 in | Wt 337.0 lb

## 2014-06-10 DIAGNOSIS — IMO0002 Reserved for concepts with insufficient information to code with codable children: Secondary | ICD-10-CM

## 2014-06-10 DIAGNOSIS — E1165 Type 2 diabetes mellitus with hyperglycemia: Secondary | ICD-10-CM | POA: Diagnosis not present

## 2014-06-10 DIAGNOSIS — I10 Essential (primary) hypertension: Secondary | ICD-10-CM

## 2014-06-10 DIAGNOSIS — E785 Hyperlipidemia, unspecified: Secondary | ICD-10-CM

## 2014-06-10 NOTE — Patient Instructions (Signed)
Victoza 0.6mg  daily  Levemir 20 in pm; change dose only if am sugar stays < 90 or 140   Please check blood sugars at least half the time about 2 hours after any meal and times per week on waking up. Please bring blood sugar monitor to each visit  Do not run out of BP Meds

## 2014-06-10 NOTE — Progress Notes (Signed)
Patient ID: Edward Clarke, male   DOB: April 02, 1966, 48 y.o.   MRN: [id number]   Reason for Appointment: Diabetes follow-up   History of Present Illness    Diagnosis: Type 2 diabetes mellitus, date of diagnosis: 2005.   He has been on basal bolus insulin regimen in the last few years along with metformin Most of the difficulty with control is related to his noncompliance with diet, medications, glucose monitoring and financial issues Previously had been running out of one or the other medication or insulin  and blood sugars would not be well controlled  Recent history:  He has finally been able to get financial assistance for his medications and has been on his basal bolus insulin regimen consistently Also he was given a trial of Victoza which he titrated to 1.2 mg. Because of nausea and vomiting he stopped this about a week ago; however he was having some nausea even with the 0.6 mg but increased the dose despite instructions not to do so He thinks he was losing weight with Victoza and was having good portion control but now is tending to have large meals and snacks He will take NovoLog before meals and even for his morning coffee he will take 10-20 units Blood sugars are somewhat variable after evening meals but not consistently high except recently Currently taking Levemir twice a day but he is adjusting the evening dose based on his blood sugar at night rather than fasting Last A1c was better in 9/15 Also back on his metformin  Has started checking his blood sugars since he has no difficulty getting these covered also Compliance with exercise is also poor and he has only a little better motivation   INSULIN: Levemir 20 units-- 20/25 units p.m.,  taking NovoLog before meals, 20-25 units ac Proper timing of medications in relation to meals: Yes   Monitors blood glucose: Minimal recently  Glucometer: One Touch.  Blood Glucose readings:    PREMEAL Breakfast Lunch  3-6 PM  Bedtime  Overall  Glucose range:  110- 146   122, 307   95-188   111-264    Mean/median:  130      135     No hypoglycemia recently  Meals: Usually 2 meals a day (supper 5-9 pm)    Carbohydrate intake: not controlled.  Food preferences: High carbohydrate; eating out at [facility] sometimes but not controlling portions  Physical activity: exercise: a little  Wt Readings from Last 3 Encounters:  06/10/14 337 lb (152.862 kg)  04/14/14 338 lb 3.2 oz (153.407 kg)  03/06/14 334 lb 12.8 oz (151.864 kg)    Retinal exam: Most recent: 2014.   The HbgA1c has been as high as 11.6 previously .  Lab Results  Component Value Date   HGBA1C 8.5* 05/21/2014   HGBA1C 10.2* 02/07/2014   HGBA1C 8.2* 11/04/2013   Lab Results  Component Value Date   MICROALBUR 1.1 11/04/2013   LDLCALC 99 11/04/2013   CREATININE 1.4 05/21/2014        Medication List       This list is accurate as of: 06/10/14  1:09 PM.  Always use your most recent med list.               aspirin EC 81 MG tablet  Take 81 mg by mouth every morning.     glucose blood test strip  Commonly known as:  ONETOUCH VERIO  Use as instructed to check blood sugar 4 times per day dx code  250.02     ibuprofen 200 MG tablet  Commonly known as:  ADVIL,MOTRIN  Take 400-800 mg by mouth every 8 (eight) hours as needed for headache.     insulin aspart 100 UNIT/ML FlexPen  Commonly known as:  NOVOLOG FLEXPEN  Inject 5-20 Units into the skin See admin instructions. He uses a sliding scale.     LEVEMIR FLEXTOUCH [place]  Inject 20 Units into the skin 2 (two) times daily.     lisinopril 40 MG tablet  Commonly known as:  PRINIVIL,ZESTRIL  TAKE ONE TABLET BY MOUTH ONCE DAILY     metFORMIN 1000 MG tablet  Commonly known as:  GLUCOPHAGE  TAKE ONE TABLET BY MOUTH TWICE DAILY     multivitamin with minerals Tabs tablet  Take 1 tablet by mouth every morning.     simvastatin 40 MG tablet  Commonly known as:  ZOCOR  Take one tablet by mouth at NIGHT.      tobramycin 0.3 % ophthalmic solution  Commonly known as:  TOBREX  Place 1 drop into both eyes See admin instructions. He uses for 2 days prior to eye procedures.        Allergies:  Allergies  Allergen Reactions  . Benazepril Other (See Comments)    Reaction unknown - may have been headaches    Past Medical History  Diagnosis Date  . Monoclonal paraproteinemia   . Diabetes mellitus   . Hypertension   . Obesity   . Hyperlipidemia     Past Surgical History  Procedure Laterality Date  . Appendectomy    . Eye surgery    . Tonsillectomy      No family history on file.  Social History:  reports that he has quit smoking. His smoking use included Cigarettes. He smoked 0.00 packs per day. He does not have any smokeless tobacco history on file. He reports that he drinks about 1.2 ounces of alcohol per week. He reports that he uses illicit drugs (Marijuana) about 7 times per week.  Review of Systems:  HYPERTENSION:  has had long-standing hypertension and blood pressure is not adequately controlled.  Again has not taken his lisinopril for 2 days and blood pressure is significantly high  HYPERLIPIDEMIA:  The lipid abnormality consists of elevated LDL treated with simvastatin.  Again not taking this as of 8/15, previously was compliant with good control   Lab Results  Component Value Date   CHOL 173 11/04/2013   HDL 36.80* 11/04/2013   LDLCALC 99 11/04/2013   LDLDIRECT 185.5 04/11/2014   TRIG 188.0* 11/04/2013   CHOLHDL 5 11/04/2013    Has had chemotherapy for myeloma previously    Examination:   BP 139/90  Pulse 66  Temp(Src) 97.7 F (36.5 C)  Resp 16  Ht 6' 1.5" (1.867 m)  Wt 337 lb (152.862 kg)  BMI 43.85 kg/m2  SpO2 97%  Body mass index is 43.85 kg/(m^2).   No pedal edema  Assesment/Plan:   1. Diabetes type 2, uncontrolled  The patient's diabetes control is improving overall with his being able to get his insulin supplies  However he still has some high readings  after meals mostly from inadequate control of diet  Fasting blood sugars are usually consistent  He has difficulty losing weight He does not appear to understand the concept of adjusting evening Levemir based on fasting blood sugars and is adjusting it based more on the nighttime readings Currently taking relatively large amounts of mealtime insulin up to 25 units  He was given Victoza and since he is intolerant to 1.2 mg he may do well with a smaller dose  Recommendations made today include: Try Victoza again starting with less than the 0.6 mg for the first 2 days and then continue with 0.6 mg until the nausea resolves; he may try additional 5 clicks subsequently Consistent dose of Levemir, to try taking 20 units twice a day for now and increase evening dose only fasting blood sugars are higher than target Adjust the NovoLog based on meal size and may take higher doses at dinner if the readings are over 180 after eating He needs to walk more consistently and this was emphasized Continue monitoring blood sugars at various times  Patient Instructions  Victoza 0.6mg  daily  Levemir 20 in pm; change dose only if am sugar stays < 90 or 140   Please check blood sugars at least half the time about 2 hours after any meal and times per week on waking up. Please bring blood sugar monitor to each visit  Do not run out of BP Meds  2. Currently  blood pressure is high and advised him to start back on lisinopril and be consistent with this   3. He will will also need to be regular with his Zocor  and have his lipids checked on the next visit  Counseling time over 50% of today's 25 minute visit  Sosha Shepherd 06/10/2014, 1:09 PM

## 2014-06-26 DIAGNOSIS — E11331 Type 2 diabetes mellitus with moderate nonproliferative diabetic retinopathy with macular edema: Secondary | ICD-10-CM | POA: Diagnosis not present

## 2014-07-08 ENCOUNTER — Other Ambulatory Visit (HOSPITAL_BASED_OUTPATIENT_CLINIC_OR_DEPARTMENT_OTHER): Payer: Medicare Other

## 2014-07-08 DIAGNOSIS — D472 Monoclonal gammopathy: Secondary | ICD-10-CM | POA: Diagnosis not present

## 2014-07-08 LAB — CBC WITH DIFFERENTIAL/PLATELET
BASO%: 0.8 % (ref 0.0–2.0)
BASOS ABS: 0 10*3/uL (ref 0.0–0.1)
EOS ABS: 0.2 10*3/uL (ref 0.0–0.5)
EOS%: 3.8 % (ref 0.0–7.0)
HEMATOCRIT: 36.2 % — AB (ref 38.4–49.9)
HEMOGLOBIN: 12.1 g/dL — AB (ref 13.0–17.1)
LYMPH%: 20.9 % (ref 14.0–49.0)
MCH: 30.8 pg (ref 27.2–33.4)
MCHC: 33.5 g/dL (ref 32.0–36.0)
MCV: 91.8 fL (ref 79.3–98.0)
MONO#: 0.4 10*3/uL (ref 0.1–0.9)
MONO%: 10.3 % (ref 0.0–14.0)
NEUT#: 2.8 10*3/uL (ref 1.5–6.5)
NEUT%: 64.2 % (ref 39.0–75.0)
Platelets: 257 10*3/uL (ref 140–400)
RBC: 3.94 10*6/uL — ABNORMAL LOW (ref 4.20–5.82)
RDW: 14.4 % (ref 11.0–14.6)
WBC: 4.3 10*3/uL (ref 4.0–10.3)
lymph#: 0.9 10*3/uL (ref 0.9–3.3)

## 2014-07-08 LAB — COMPREHENSIVE METABOLIC PANEL (CC13)
ALT: 19 U/L (ref 0–55)
ANION GAP: 8 meq/L (ref 3–11)
AST: 16 U/L (ref 5–34)
Albumin: 3.8 g/dL (ref 3.5–5.0)
Alkaline Phosphatase: 67 U/L (ref 40–150)
BUN: 6.9 mg/dL — ABNORMAL LOW (ref 7.0–26.0)
CALCIUM: 8.6 mg/dL (ref 8.4–10.4)
CHLORIDE: 108 meq/L (ref 98–109)
CO2: 22 mEq/L (ref 22–29)
Creatinine: 1.1 mg/dL (ref 0.7–1.3)
GLUCOSE: 150 mg/dL — AB (ref 70–140)
Potassium: 4.1 mEq/L (ref 3.5–5.1)
Sodium: 138 mEq/L (ref 136–145)
Total Bilirubin: 0.49 mg/dL (ref 0.20–1.20)
Total Protein: 6.4 g/dL (ref 6.4–8.3)

## 2014-07-10 LAB — SPEP & IFE WITH QIG
ALPHA-1-GLOBULIN: 8.4 % — AB (ref 2.9–4.9)
Albumin ELP: 62.4 % (ref 55.8–66.1)
Alpha-2-Globulin: 9.6 % (ref 7.1–11.8)
BETA 2: 4.5 % (ref 3.2–6.5)
Beta Globulin: 4.7 % (ref 4.7–7.2)
GAMMA GLOBULIN: 10.4 % — AB (ref 11.1–18.8)
IGA: 17 mg/dL — AB (ref 68–379)
IGM, SERUM: 89 mg/dL (ref 41–251)
IgG (Immunoglobin G), Serum: 710 mg/dL (ref 650–1600)
M-Spike, %: 0.17 g/dL
Total Protein, Serum Electrophoresis: 6.1 g/dL (ref 6.0–8.3)

## 2014-07-15 ENCOUNTER — Ambulatory Visit (HOSPITAL_BASED_OUTPATIENT_CLINIC_OR_DEPARTMENT_OTHER): Payer: Medicare Other | Admitting: Oncology

## 2014-07-15 ENCOUNTER — Telehealth: Payer: Self-pay | Admitting: Oncology

## 2014-07-15 VITALS — BP 132/77 | HR 67 | Temp 97.8°F | Resp 18 | Ht 73.5 in | Wt 340.1 lb

## 2014-07-15 DIAGNOSIS — D649 Anemia, unspecified: Secondary | ICD-10-CM

## 2014-07-15 DIAGNOSIS — D472 Monoclonal gammopathy: Secondary | ICD-10-CM | POA: Diagnosis not present

## 2014-07-15 NOTE — Telephone Encounter (Signed)
gv adn printed appt sched and avs foer pt for May 2016

## 2014-07-15 NOTE — Progress Notes (Signed)
Hematology and Oncology Follow Up Visit  Edward Clarke [id number] 04/21/1966 48 y.o. 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  He had presented with IgM about 3 g/dL and M-spike about 2 g/dL.  His workup that includes imaging studies, CT scan of the chest, abdomen and pelvis and skeletal survey had been really unrevealing. Bone marrow biopsy confirmed the diagnosis.    Previous therapy: Started chemotherapy with Bendamustine and Rituxan on 02/14/13. He is S/P cycle 6 on 07/04/2013 with CR.   Current therapy: Observation and surveillance.  Interim History:  [name] presents today for a follow-up visit with his wife. Since his last visit, he is not reporting any new symptoms. He states his energy level is back to normal. He reports no dyspnea on exertion. He is reporting erectile dysfunction and will discuss that with his primary care physician.Denies chest pain. No rashes.  He has not reported any alternation in his mental status. He did not report any neurological symptoms.  Overall, performance status and activity level is back to baseline. He is not reporting any fevers chills or sweats. He reports no hospitalization or illnesses. He has not reported any constitutional symptoms of fevers or chills or sweats. Has not reported any alteration of mental status. The report any confusion or neurological deficits. Rest of his review of system is unremarkable.  Medications: I have reviewed the patient's current medications.  Current Outpatient Prescriptions  Medication Sig Dispense Refill  . Liraglutide (VICTOZA [place]) Inject 0.6 Units into the skin daily. Pt unsure of dosage    . aspirin EC 81 MG tablet Take 81 mg by mouth every morning.    [name] glucose blood (ONETOUCH VERIO) test strip Use as instructed to check blood sugar 4 times per  day dx code 250.02 125 each 5  . ibuprofen (ADVIL,MOTRIN) 200 MG tablet Take 400-800 mg by mouth every 8 (eight) hours as needed for headache.    . insulin aspart (NOVOLOG FLEXPEN) 100 UNIT/ML FlexPen Inject 5-20 Units into the skin See admin instructions. He uses a sliding scale. 15 mL 3  . Insulin Detemir (LEVEMIR FLEXTOUCH ) Inject 20 Units into the skin 2 (two) times daily.    [name] lisinopril (PRINIVIL,ZESTRIL) 40 MG tablet TAKE ONE TABLET BY MOUTH ONCE DAILY 30 tablet 3  . metFORMIN (GLUCOPHAGE) 1000 MG tablet TAKE ONE TABLET BY MOUTH TWICE DAILY 60 tablet 3  . Multiple Vitamin (MULTIVITAMIN WITH MINERALS) TABS Take 1 tablet by mouth every morning.    . simvastatin (ZOCOR) 40 MG tablet Take one tablet by mouth at NIGHT. 30 tablet 3  . tobramycin (TOBREX) 0.3 % ophthalmic solution Place 1 drop into both eyes See admin instructions. He uses for 2 days prior to eye procedures.     No current facility-administered medications for this visit.     Allergies:  Allergies  Allergen Reactions  . Benazepril Other (See Comments)    Reaction unknown - may have been headaches    Past Medical History, Surgical history, Social history, and Family History were reviewed and updated.  Review of Systems:  Remaining ROS negative.  Physical Exam: Blood pressure 132/77, pulse 67, temperature 97.8 F (36.6 C), temperature source Oral, resp. rate 18, height 6' 1.5" (1.867 m), weight 340 lb 1.6 oz (154.268 kg). ECOG: 1 General appearance: alert Head: Normocephalic, without obvious abnormality, atraumatic Neck: no adenopathy  Lymph nodes: Cervical, supraclavicular, and axillary nodes normal. Heart:regular rate and rhythm, S1, S2 normal, no murmur, click, rub or gallop Lung:chest clear, no wheezing, rales, normal symmetric air entry Abdomen: soft, non-tender, without masses or organomegaly EXT:no erythema, induration, or nodules   Lab Results: Lab Results  Component Value Date   WBC 4.3 07/08/2014    HGB 12.1* 07/08/2014   HCT 36.2* 07/08/2014   MCV 91.8 07/08/2014   PLT 257 07/08/2014     Chemistry      Component Value Date/Time   NA 138 07/08/2014 1036   NA 138 05/21/2014 0958   K 4.1 07/08/2014 1036   K 4.4 05/21/2014 0958   CL 104 05/21/2014 0958   CL 101 02/14/2013 0758   CO2 22 07/08/2014 1036   CO2 25 05/21/2014 0958   BUN 6.9* 07/08/2014 1036   BUN 11 05/21/2014 0958   CREATININE 1.1 07/08/2014 1036   CREATININE 1.4 05/21/2014 0958      Component Value Date/Time   CALCIUM 8.6 07/08/2014 1036   CALCIUM 9.4 05/21/2014 0958   ALKPHOS 67 07/08/2014 1036   ALKPHOS 51 11/24/2010 1320   AST 16 07/08/2014 1036   AST 18 11/24/2010 1320   ALT 19 07/08/2014 1036   ALT 17 11/24/2010 1320   BILITOT 0.49 07/08/2014 1036   BILITOT 0.4 11/24/2010 1320      Results for Edward, Clarke (MRN [id number]) as of 07/15/2014 10:22  Ref. Range 02/26/2014 15:09 07/08/2014 10:36  M-SPIKE, % No range found 0.22 0.17  SPE Interp. No range found * *  IgG (Immunoglobin G), Serum Latest Range: 212-412-0935 mg/dL 727 710   Results for Edward, Clarke (MRN [id number]) as of 07/15/2014 10:22  Ref. Range 08/20/2013 10:43 11/12/2013 09:56 02/26/2014 15:09 07/08/2014 10:36  IgM, Serum Latest Range: 41-251 mg/dL 286 (H) 170 146 89    Impression and Plan:  This is a 48 year old gentleman with the following issues: 1. IgM monoclonal paraproteinemia. This is due to  lymphoproliferative disorder such as [place]rm's or lymphoplasmacytic lymphoma.  He is status post 6 cycles of bendamustine and Rituxan. Therapy has been well tolerated and he had a near complete response with normalization of his IgM level and near disappearance of his M spike. His protein studies were reviewed today and continues to show decline in his M spike. It is barely detectable at this time. His IgM level of also normalized. 2. Anemia: likely related to #1 and recent chemotherapy.  His hemoglobin has  normalized. 3. Hyperviscosity syndrome: This will less likely to happen at this time. He has no clinical signs of symptoms at this time. 4. Follow up in 6 months.     ZHGDJM,EQAST 11/10/201510:34 AM

## 2014-07-17 ENCOUNTER — Other Ambulatory Visit (INDEPENDENT_AMBULATORY_CARE_PROVIDER_SITE_OTHER): Payer: Medicare Other

## 2014-07-17 DIAGNOSIS — E785 Hyperlipidemia, unspecified: Secondary | ICD-10-CM | POA: Diagnosis not present

## 2014-07-17 DIAGNOSIS — E1165 Type 2 diabetes mellitus with hyperglycemia: Secondary | ICD-10-CM | POA: Diagnosis not present

## 2014-07-17 DIAGNOSIS — IMO0002 Reserved for concepts with insufficient information to code with codable children: Secondary | ICD-10-CM

## 2014-07-17 DIAGNOSIS — E119 Type 2 diabetes mellitus without complications: Secondary | ICD-10-CM | POA: Diagnosis not present

## 2014-07-17 LAB — LIPID PANEL
CHOL/HDL RATIO: 8
Cholesterol: 255 mg/dL — ABNORMAL HIGH (ref 0–200)
HDL: 30.5 mg/dL — ABNORMAL LOW (ref >39.00)
LDL CALC: 191 mg/dL — AB (ref 0–99)
NonHDL: 224.5
Triglycerides: 170 mg/dL — ABNORMAL HIGH (ref 0.0–149.0)
VLDL: 34 mg/dL (ref 0.0–40.0)

## 2014-07-17 LAB — COMPREHENSIVE METABOLIC PANEL
ALBUMIN: 3.7 g/dL (ref 3.5–5.2)
ALT: 16 U/L (ref 0–53)
AST: 15 U/L (ref 0–37)
Alkaline Phosphatase: 69 U/L (ref 39–117)
BUN: 11 mg/dL (ref 6–23)
CHLORIDE: 105 meq/L (ref 96–112)
CO2: 21 mEq/L (ref 19–32)
Calcium: 9 mg/dL (ref 8.4–10.5)
Creatinine, Ser: 1.2 mg/dL (ref 0.4–1.5)
GFR: 80.96 mL/min (ref >60.00)
Glucose, Bld: 175 mg/dL — ABNORMAL HIGH (ref 70–99)
Potassium: 4 mEq/L (ref 3.5–5.1)
SODIUM: 136 meq/L (ref 135–145)
Total Bilirubin: 0.6 mg/dL (ref 0.2–1.2)
Total Protein: 6.8 g/dL (ref 6.0–8.3)

## 2014-07-17 LAB — MICROALBUMIN / CREATININE URINE RATIO
CREATININE, U: 104.5 mg/dL
Microalb Creat Ratio: 0.9 mg/g (ref 0.0–30.0)
Microalb, Ur: 0.9 mg/dL (ref 0.0–1.9)

## 2014-07-21 ENCOUNTER — Telehealth: Payer: Self-pay | Admitting: Endocrinology

## 2014-07-21 NOTE — Telephone Encounter (Signed)
Pt needs levemir sent in through pt assistance please

## 2014-07-22 ENCOUNTER — Encounter: Payer: Self-pay | Admitting: Endocrinology

## 2014-07-22 ENCOUNTER — Ambulatory Visit (INDEPENDENT_AMBULATORY_CARE_PROVIDER_SITE_OTHER): Payer: Medicare Other | Admitting: Endocrinology

## 2014-07-22 VITALS — BP 171/106 | HR 74 | Temp 98.3°F | Resp 16 | Ht 73.5 in | Wt 341.6 lb

## 2014-07-22 DIAGNOSIS — E785 Hyperlipidemia, unspecified: Secondary | ICD-10-CM | POA: Diagnosis not present

## 2014-07-22 DIAGNOSIS — I10 Essential (primary) hypertension: Secondary | ICD-10-CM | POA: Diagnosis not present

## 2014-07-22 DIAGNOSIS — IMO0002 Reserved for concepts with insufficient information to code with codable children: Secondary | ICD-10-CM

## 2014-07-22 DIAGNOSIS — E1165 Type 2 diabetes mellitus with hyperglycemia: Secondary | ICD-10-CM | POA: Diagnosis not present

## 2014-07-22 LAB — FRUCTOSAMINE: Fructosamine: 288 umol/L — ABNORMAL HIGH (ref 190–270)

## 2014-07-22 MED ORDER — HYDROCHLOROTHIAZIDE 25 MG PO TABS: 25.0000 mg | ORAL_TABLET | Freq: Every day | ORAL | Status: DC

## 2014-07-22 NOTE — Patient Instructions (Signed)
Start exercise  EXTRA POTASSIUM in diet  Please check blood sugars at least half the time about 2 hours after any meal and times per week on waking up. Please bring blood sugar monitor to each visit

## 2014-07-22 NOTE — Progress Notes (Deleted)
Patient ID: Edward Clarke, male   DOB: [birth year], 48 y.o.   MRN: [id number]   Reason for Appointment: Diabetes follow-up   History of Present Illness    Diagnosis: Type 2 diabetes mellitus, date of diagnosis: 2005.   He has been on basal bolus insulin regimen in the last few years along with metformin Most of the difficulty with control is related to his noncompliance with diet, medications, glucose monitoring and financial issues Previously had been running out of one or the other medication or insulin  and blood sugars would not be well controlled  Recent history:  He has been able to get financial assistance for his medications and has been on his basal bolus insulin regimen Also he was started on Victoza which he titrated to 1.2 mg. Which caused significant anorexia and some nausea and he stopped it Although he thinks his blood sugars are fairly good he did not bring his monitor for review today He was told to restart Victoza which she has increased from the 0.6 mg up to nearly halfway up to the 1.2 Mark However he still does not think it has reduced his appetite and he is unable to control portions again Levemir: He is taking this twice a day and has increased evening dose to 25 units as directed but ran out 2 days ago Mealtime insulin: He is still taking 20-25 units but does not monitor postprandial readings to assess level of postprandial control Recently has not been able to afford his metformin and has not taken it His wife says that he is not doing any exercise and has not been motivated despite reminders No change in weight  INSULIN: Levemir 20 units--  25 units p.m.,  taking NovoLog before meals, 20-25 units ac Proper timing of medications in relation to meals: Yes   Monitors blood glucose: occasionally recently  Glucometer: One Touch.  Blood Glucose readings by recall:   Am 120-150; non fasting 150; ?pcs    No hypoglycemia recently  Meals: Usually 2 meals a day (supper  5-9 pm)    Carbohydrate intake: not controlled.  Food preferences: High carbohydrate; eating out at [facility] sometimes but not controlling portions  Physical activity: exercise: minimal  Wt Readings from Last 3 Encounters:  07/22/14 341 lb 9.6 oz (154.949 kg)  07/15/14 340 lb 1.6 oz (154.268 kg)  06/10/14 337 lb (152.862 kg)    Retinal exam: Most recent: 2014.   The HbgA1c has been as high as 11.6 previously .  Lab Results  Component Value Date   HGBA1C 8.5* 05/21/2014   HGBA1C 10.2* 02/07/2014   HGBA1C 8.2* 11/04/2013   Lab Results  Component Value Date   MICROALBUR 0.9 07/17/2014   LDLCALC 191* 07/17/2014   CREATININE 1.2 07/17/2014        Medication List       This list is accurate as of: 07/22/14  7:15 PM.  Always use your most recent med list.               aspirin EC 81 MG tablet  Take 81 mg by mouth every morning.     glucose blood test strip  Commonly known as:  ONETOUCH VERIO  Use as instructed to check blood sugar 4 times per day dx code 250.02     hydrochlorothiazide 25 MG tablet  Commonly known as:  HYDRODIURIL  Take 1 tablet (25 mg total) by mouth daily.     ibuprofen 200 MG tablet  Commonly known as:  ADVIL,MOTRIN  Take 400-800 mg by mouth every 8 (eight) hours as needed for headache.     insulin aspart 100 UNIT/ML FlexPen  Commonly known as:  NOVOLOG FLEXPEN  Inject 5-20 Units into the skin See admin instructions. He uses a sliding scale.     LEVEMIR FLEXTOUCH [place]  Inject 20 Units into the skin 2 (two) times daily.     lisinopril 40 MG tablet  Commonly known as:  PRINIVIL,ZESTRIL  TAKE ONE TABLET BY MOUTH ONCE DAILY     metFORMIN 1000 MG tablet  Commonly known as:  GLUCOPHAGE  TAKE ONE TABLET BY MOUTH TWICE DAILY     multivitamin with minerals Tabs tablet  Take 1 tablet by mouth every morning.     simvastatin 40 MG tablet  Commonly known as:  ZOCOR  Take one tablet by mouth at NIGHT.     tobramycin 0.3 % ophthalmic solution   Commonly known as:  TOBREX  Place 1 drop into both eyes See admin instructions. He uses for 2 days prior to eye procedures.     VICTOZA [place]  Inject 1.2 Units into the skin daily. Pt unsure of dosage        Allergies:  Allergies  Allergen Reactions  . Benazepril Other (See Comments)    Reaction unknown - may have been headaches    Past Medical History  Diagnosis Date  . Monoclonal paraproteinemia   . Diabetes mellitus   . Hypertension   . Obesity   . Hyperlipidemia     Past Surgical History  Procedure Laterality Date  . Appendectomy    . Eye surgery    . Tonsillectomy      No family history on file.  Social History:  reports that he has quit smoking. His smoking use included Cigarettes. He smoked 0.00 packs per day. He does not have any smokeless tobacco history on file. He reports that he drinks about 1.2 oz of alcohol per week. He reports that he uses illicit drugs (Marijuana) about 7 times per week.  Review of Systems:  HYPERTENSION:  has had long-standing hypertension and blood pressure is not controlled.  Again has not taken his lisinopril for several days because of cost and blood pressure is significantly high  HYPERLIPIDEMIA:  The lipid abnormality consists of elevated LDL treated with simvastatin.  Again not taking this and lipids are poorly controlled  Lab Results  Component Value Date   CHOL 255* 07/17/2014   HDL 30.50* 07/17/2014   LDLCALC 191* 07/17/2014   LDLDIRECT 185.5 04/11/2014   TRIG 170.0* 07/17/2014   CHOLHDL 8 07/17/2014    Has had chemotherapy for myeloma previously and has had good results from recent lab studies by hematologist    Examination:   BP 171/106 mmHg  Pulse 74  Temp(Src) 98.3 F (36.8 C)  Resp 16  Ht 6' 1.5" (1.867 m)  Wt 341 lb 9.6 oz (154.949 kg)  BMI 44.45 kg/m2  SpO2 97%  Body mass index is 44.45 kg/(m^2).   No pedal edema  Assesment/Plan:   1. Diabetes type 2, uncontrolled  The patient's diabetes  control is difficult to assess as he did not bring his monitor for download Howeverimproving overall with his being able to get his insulin supplies  However he still has some high readings after meals mostly from inadequate control of diet  Fasting blood sugars are usually consistent  He has difficulty losing weight He does not appear to understand the concept of adjusting evening Levemir  based on fasting blood sugars and is adjusting it based more on the nighttime readings Currently taking relatively large amounts of mealtime insulin up to 25 units He was given Victoza and since he is intolerant to 1.2 mg he may do well with a smaller dose  Recommendations made today include: Try Victoza again starting with less than the 0.6 mg for the first 2 days and then continue with 0.6 mg until the nausea resolves; he may try additional 5 clicks subsequently Consistent dose of Levemir, to try taking 20 units twice a day for now and increase evening dose only fasting blood sugars are higher than target Adjust the NovoLog based on meal size and may take higher doses at dinner if the readings are over 180 after eating He needs to walk more consistently and this was emphasized Continue monitoring blood sugars at various times  Patient Instructions  Start exercise  EXTRA POTASSIUM in diet  Please check blood sugars at least half the time about 2 hours after any meal and times per week on waking up. Please bring blood sugar monitor to each visit   2. Currently  blood pressure is high and advised him to start back on lisinopril and be consistent with this   3. He will will also need to be regular with his Zocor  and have his lipids checked on the next visit  Counseling time over 50% of today's 25 minute visit  Malike Foglio 07/22/2014, 7:15 PM

## 2014-07-22 NOTE — Progress Notes (Signed)
Patient ID: Edward Clarke, male   DOB: [birth year], 48 y.o.   MRN: [id number]   Reason for Appointment: Diabetes follow-up   History of Present Illness    Diagnosis: Type 2 diabetes mellitus, date of diagnosis: 2005.   He has been on basal bolus insulin regimen in the last few years along with metformin Most of the difficulty with control is related to his noncompliance with diet, medications, glucose monitoring and financial issues Previously had been running out of one or the other medication or insulin  and blood sugars would not be well controlled  Recent history:  He has been able to get financial assistance for his medications and has been on his basal bolus insulin regimen Also he was started on Victoza which he titrated to 1.2 mg. Which caused significant anorexia and some nausea and he stopped it Although he thinks his blood sugars are fairly good he did not bring his monitor for review today He was told to restart Victoza which she has increased from the 0.6 mg up to nearly halfway up to the 1.2 Mark However he still does not think it has reduced his appetite and he is unable to control portions again Levemir: He is taking this twice a day and has increased evening dose to 25 units as directed but ran out 2 days ago Mealtime insulin: He is still taking 20-25 units but does not monitor postprandial readings to assess level of postprandial control Recently has not been able to afford his metformin and has not taken it His wife says that he is not doing any exercise and has not been motivated despite reminders No change in weight  INSULIN: Levemir 20 units--  25 units p.m.,  taking NovoLog before meals, 20-25 units ac Proper timing of medications in relation to meals: Yes   Monitors blood glucose: occasionally recently  Glucometer: One Touch.  Blood Glucose readings by recall:   Am 120-150; non fasting 150; ?pcs    No hypoglycemia recently  Meals: Usually 2 meals a day (supper  5-9 pm)    Carbohydrate intake: not controlled.  Food preferences: High carbohydrate; eating out at [facility] sometimes but not controlling portions  Physical activity: exercise: minimal  Wt Readings from Last 3 Encounters:  07/22/14 341 lb 9.6 oz (154.949 kg)  07/15/14 340 lb 1.6 oz (154.268 kg)  06/10/14 337 lb (152.862 kg)    Retinal exam: Most recent: 2014.   The HbgA1c has been as high as 11.6 previously .  Lab Results  Component Value Date   HGBA1C 8.5* 05/21/2014   HGBA1C 10.2* 02/07/2014   HGBA1C 8.2* 11/04/2013   Lab Results  Component Value Date   MICROALBUR 0.9 07/17/2014   LDLCALC 191* 07/17/2014   CREATININE 1.2 07/17/2014        Medication List       This list is accurate as of: 07/22/14  7:24 PM.  Always use your most recent med list.               aspirin EC 81 MG tablet  Take 81 mg by mouth every morning.     glucose blood test strip  Commonly known as:  ONETOUCH VERIO  Use as instructed to check blood sugar 4 times per day dx code 250.02     hydrochlorothiazide 25 MG tablet  Commonly known as:  HYDRODIURIL  Take 1 tablet (25 mg total) by mouth daily.     ibuprofen 200 MG tablet  Commonly known as:  ADVIL,MOTRIN  Take 400-800 mg by mouth every 8 (eight) hours as needed for headache.     insulin aspart 100 UNIT/ML FlexPen  Commonly known as:  NOVOLOG FLEXPEN  Inject 5-20 Units into the skin See admin instructions. He uses a sliding scale.     LEVEMIR FLEXTOUCH [place]  Inject 20 Units into the skin 2 (two) times daily.     lisinopril 40 MG tablet  Commonly known as:  PRINIVIL,ZESTRIL  TAKE ONE TABLET BY MOUTH ONCE DAILY     metFORMIN 1000 MG tablet  Commonly known as:  GLUCOPHAGE  TAKE ONE TABLET BY MOUTH TWICE DAILY     multivitamin with minerals Tabs tablet  Take 1 tablet by mouth every morning.     simvastatin 40 MG tablet  Commonly known as:  ZOCOR  Take one tablet by mouth at NIGHT.     tobramycin 0.3 % ophthalmic solution   Commonly known as:  TOBREX  Place 1 drop into both eyes See admin instructions. He uses for 2 days prior to eye procedures.     VICTOZA [place]  Inject 1.2 Units into the skin daily. Pt unsure of dosage        Allergies:  Allergies  Allergen Reactions  . Benazepril Other (See Comments)    Reaction unknown - may have been headaches    Past Medical History  Diagnosis Date  . Monoclonal paraproteinemia   . Diabetes mellitus   . Hypertension   . Obesity   . Hyperlipidemia     Past Surgical History  Procedure Laterality Date  . Appendectomy    . Eye surgery    . Tonsillectomy      No family history on file.  Social History:  reports that he has quit smoking. His smoking use included Cigarettes. He smoked 0.00 packs per day. He does not have any smokeless tobacco history on file. He reports that he drinks about 1.2 oz of alcohol per week. He reports that he uses illicit drugs (Marijuana) about 7 times per week.  Review of Systems:  HYPERTENSION:  has had long-standing hypertension and blood pressure is not controlled.  Again has not taken his lisinopril for several days because of cost and blood pressure is significantly high  HYPERLIPIDEMIA:  The lipid abnormality consists of elevated LDL treated with simvastatin.  Again not taking this and lipids are poorly controlled  Lab Results  Component Value Date   CHOL 255* 07/17/2014   HDL 30.50* 07/17/2014   LDLCALC 191* 07/17/2014   LDLDIRECT 185.5 04/11/2014   TRIG 170.0* 07/17/2014   CHOLHDL 8 07/17/2014    Has had chemotherapy for myeloma previously and has had good results from recent lab studies by hematologist    Examination:   BP 171/106 mmHg  Pulse 74  Temp(Src) 98.3 F (36.8 C)  Resp 16  Ht 6' 1.5" (1.867 m)  Wt 341 lb 9.6 oz (154.949 kg)  BMI 44.45 kg/m2  SpO2 97%  Body mass index is 44.45 kg/(m^2).   No pedal edema  Assesment/Plan:   1. Diabetes type 2, uncontrolled  The patient's diabetes  control is fair overall with his improved compliance with injectables  He has difficulty losing weight and needs to titrate up the Victoza Currently taking relatively large amounts of mealtime insulin up to 25 units  Recommendations made today include: Titrate Victoza gradually upto maximally tolerated dose or 1.2 mg  Adjust the NovoLog based on meal size and may take higher doses at dinner if  the readings are over 180 after eating He needs to walk or do home video exercises; again this was emphasized Need to be monitoring blood sugars at various times including after meals  2. HYPERTENSION: Currently  blood pressure is significantly high and since he cannot afford lisinopril will have him start HCTZ which will be inexpensive  He will also increase oral potassium intake  3. Hyperlipidemia:He will will also need to be regular with his Zocor once he is able to get this on insurance and have his lipids checked on the next visit  Patient Instructions  Start exercise  EXTRA POTASSIUM in diet  Please check blood sugars at least half the time about 2 hours after any meal and times per week on waking up. Please bring blood sugar monitor to each visit      Armanii Pressnell 07/22/2014, 7:24 PM

## 2014-07-22 NOTE — Progress Notes (Deleted)
Patient ID: Edward Clarke, male   DOB: [birth year], 48 y.o.   MRN: [id number]   Reason for Appointment: Diabetes follow-up   History of Present Illness    Diagnosis: Type 2 diabetes mellitus, date of diagnosis: 2005.   He has been on basal bolus insulin regimen in the last few years along with metformin Most of the difficulty with control is related to his noncompliance with diet, medications, glucose monitoring and financial issues Previously had been running out of one or the other medication or insulin  and blood sugars would not be well controlled  Recent history:  He has finally been able to get financial assistance for his medications and has been on his basal bolus insulin regimen consistently Also he was given a trial of Victoza which he titrated to 1.2 mg. Victoza 0.9  He thinks he was losing weight with Victoza and was having good portion control but now is tending to have large meals and snacks He will take NovoLog before meals and even for his morning coffee he will take 10-20 units Blood sugars are somewhat variable after evening meals but not consistently high except recently Currently taking Levemir twice a day but he is adjusting the evening dose based on his blood sugar at night rather than fasting Last A1c was better in 9/15 Also back on his metformin  Has started checking his blood sugars since he has no difficulty getting these covered also Compliance with exercise is also poor and he has only a little better motivation   INSULIN: Levemir 20 units--  25 units p.m.,  taking NovoLog before meals, 20-25 units ac Proper timing of medications in relation to meals: Yes   Monitors blood glucose: Minimal recently  Glucometer: One Touch.  Blood Glucose readings:   Am 120-150; non fasting 150; ?pcs    PREMEAL Breakfast Lunch  3-6 PM  Bedtime Overall  Glucose range:  110- 146   122, 307   95-188   111-264    Mean/median:  130      135     No hypoglycemia  recently  Meals: Usually 2 meals a day (supper 5-9 pm)    Carbohydrate intake: not controlled.  Food preferences: High carbohydrate; eating out at [facility] sometimes but not controlling portions  Physical activity: exercise: minimal  Wt Readings from Last 3 Encounters:  07/22/14 341 lb 9.6 oz (154.949 kg)  07/15/14 340 lb 1.6 oz (154.268 kg)  06/10/14 337 lb (152.862 kg)    Retinal exam: Most recent: 2014.   The HbgA1c has been as high as 11.6 previously .  Lab Results  Component Value Date   HGBA1C 8.5* 05/21/2014   HGBA1C 10.2* 02/07/2014   HGBA1C 8.2* 11/04/2013   Lab Results  Component Value Date   MICROALBUR 0.9 07/17/2014   LDLCALC 191* 07/17/2014   CREATININE 1.2 07/17/2014        Medication List       This list is accurate as of: 07/22/14  7:20 PM.  Always use your most recent med list.               aspirin EC 81 MG tablet  Take 81 mg by mouth every morning.     glucose blood test strip  Commonly known as:  ONETOUCH VERIO  Use as instructed to check blood sugar 4 times per day dx code 250.02     hydrochlorothiazide 25 MG tablet  Commonly known as:  HYDRODIURIL  Take 1 tablet (25 mg total)  by mouth daily.     ibuprofen 200 MG tablet  Commonly known as:  ADVIL,MOTRIN  Take 400-800 mg by mouth every 8 (eight) hours as needed for headache.     insulin aspart 100 UNIT/ML FlexPen  Commonly known as:  NOVOLOG FLEXPEN  Inject 5-20 Units into the skin See admin instructions. He uses a sliding scale.     LEVEMIR FLEXTOUCH [place]  Inject 20 Units into the skin 2 (two) times daily.     lisinopril 40 MG tablet  Commonly known as:  PRINIVIL,ZESTRIL  TAKE ONE TABLET BY MOUTH ONCE DAILY     metFORMIN 1000 MG tablet  Commonly known as:  GLUCOPHAGE  TAKE ONE TABLET BY MOUTH TWICE DAILY     multivitamin with minerals Tabs tablet  Take 1 tablet by mouth every morning.     simvastatin 40 MG tablet  Commonly known as:  ZOCOR  Take one tablet by mouth at NIGHT.      tobramycin 0.3 % ophthalmic solution  Commonly known as:  TOBREX  Place 1 drop into both eyes See admin instructions. He uses for 2 days prior to eye procedures.     VICTOZA [place]  Inject 1.2 Units into the skin daily. Pt unsure of dosage        Allergies:  Allergies  Allergen Reactions  . Benazepril Other (See Comments)    Reaction unknown - may have been headaches    Past Medical History  Diagnosis Date  . Monoclonal paraproteinemia   . Diabetes mellitus   . Hypertension   . Obesity   . Hyperlipidemia     Past Surgical History  Procedure Laterality Date  . Appendectomy    . Eye surgery    . Tonsillectomy      No family history on file.  Social History:  reports that he has quit smoking. His smoking use included Cigarettes. He smoked 0.00 packs per day. He does not have any smokeless tobacco history on file. He reports that he drinks about 1.2 oz of alcohol per week. He reports that he uses illicit drugs (Marijuana) about 7 times per week.  Review of Systems:  HYPERTENSION:  has had long-standing hypertension and blood pressure is not adequately controlled.  Again has not taken his lisinopril for 2 days and blood pressure is significantly high  HYPERLIPIDEMIA:  The lipid abnormality consists of elevated LDL treated with simvastatin.  Again not taking this as of 8/15, previously was compliant with good control   Lab Results  Component Value Date   CHOL 255* 07/17/2014   HDL 30.50* 07/17/2014   LDLCALC 191* 07/17/2014   LDLDIRECT 185.5 04/11/2014   TRIG 170.0* 07/17/2014   CHOLHDL 8 07/17/2014    Has had chemotherapy for myeloma previously    Examination:   BP 171/106 mmHg  Pulse 74  Temp(Src) 98.3 F (36.8 C)  Resp 16  Ht 6' 1.5" (1.867 m)  Wt 341 lb 9.6 oz (154.949 kg)  BMI 44.45 kg/m2  SpO2 97%  Body mass index is 44.45 kg/(m^2).   No pedal edema  Assesment/Plan:     Patient Instructions  Start exercise  EXTRA POTASSIUM in  diet  Please check blood sugars at least half the time about 2 hours after any meal and times per week on waking up. Please bring blood sugar monitor to each visit   2. Currently  blood pressure is high and will give him HCTZ 25 mg as he cannot afford  to start back  on lisinopril       Kell Ferris 07/22/2014, 7:20 PM

## 2014-07-22 NOTE — Progress Notes (Deleted)
Patient ID: Edward Clarke, male   DOB: 09-10-1965, 49 y.o.   MRN: [id number]   Reason for Appointment: Diabetes follow-up   History of Present Illness    Diagnosis: Type 2 diabetes mellitus, date of diagnosis: 2005.   He has been on basal bolus insulin regimen in the last few years along with metformin Most of the difficulty with control is related to his noncompliance with diet, medications, glucose monitoring and financial issues Previously had been running out of one or the other medication or insulin  and blood sugars would not be well controlled  Recent history:  He has finally been able to get financial assistance for his medications and has been on his basal bolus insulin regimen consistently Also he was given a trial of Victoza which he titrated to 1.2 mg. Victoza 0.9  He thinks he was losing weight with Victoza and was having good portion control but now is tending to have large meals and snacks He will take NovoLog before meals and even for his morning coffee he will take 10-20 units Blood sugars are somewhat variable after evening meals but not consistently high except recently Currently taking Levemir twice a day but he is adjusting the evening dose based on his blood sugar at night rather than fasting Last A1c was better in 9/15 Also back on his metformin  Has started checking his blood sugars since he has no difficulty getting these covered also Compliance with exercise is also poor and he has only a little better motivation   INSULIN: Levemir 20 units--  25 units p.m.,  taking NovoLog before meals, 20-25 units ac Proper timing of medications in relation to meals: Yes   Monitors blood glucose: Minimal recently  Glucometer: One Touch.  Blood Glucose readings:   Am 120-150; non fasting 150; ?pcs    PREMEAL Breakfast Lunch  3-6 PM  Bedtime Overall  Glucose range:  110- 146   122, 307   95-188   111-264    Mean/median:  130      135     No hypoglycemia  recently  Meals: Usually 2 meals a day (supper 5-9 pm)    Carbohydrate intake: not controlled.  Food preferences: High carbohydrate; eating out at [facility] sometimes but not controlling portions  Physical activity: exercise: minimal  Wt Readings from Last 3 Encounters:  07/22/14 341 lb 9.6 oz (154.949 kg)  07/15/14 340 lb 1.6 oz (154.268 kg)  06/10/14 337 lb (152.862 kg)    Retinal exam: Most recent: 2014.   The HbgA1c has been as high as 11.6 previously .  Lab Results  Component Value Date   HGBA1C 8.5* 05/21/2014   HGBA1C 10.2* 02/07/2014   HGBA1C 8.2* 11/04/2013   Lab Results  Component Value Date   MICROALBUR 0.9 07/17/2014   LDLCALC 191* 07/17/2014   CREATININE 1.2 07/17/2014        Medication List       This list is accurate as of: 07/22/14  7:08 PM.  Always use your most recent med list.               aspirin EC 81 MG tablet  Take 81 mg by mouth every morning.     glucose blood test strip  Commonly known as:  ONETOUCH VERIO  Use as instructed to check blood sugar 4 times per day dx code 250.02     hydrochlorothiazide 25 MG tablet  Commonly known as:  HYDRODIURIL  Take 1 tablet (25 mg total)  by mouth daily.     ibuprofen 200 MG tablet  Commonly known as:  ADVIL,MOTRIN  Take 400-800 mg by mouth every 8 (eight) hours as needed for headache.     insulin aspart 100 UNIT/ML FlexPen  Commonly known as:  NOVOLOG FLEXPEN  Inject 5-20 Units into the skin See admin instructions. He uses a sliding scale.     LEVEMIR FLEXTOUCH [place]  Inject 20 Units into the skin 2 (two) times daily.     lisinopril 40 MG tablet  Commonly known as:  PRINIVIL,ZESTRIL  TAKE ONE TABLET BY MOUTH ONCE DAILY     metFORMIN 1000 MG tablet  Commonly known as:  GLUCOPHAGE  TAKE ONE TABLET BY MOUTH TWICE DAILY     multivitamin with minerals Tabs tablet  Take 1 tablet by mouth every morning.     simvastatin 40 MG tablet  Commonly known as:  ZOCOR  Take one tablet by mouth at NIGHT.      tobramycin 0.3 % ophthalmic solution  Commonly known as:  TOBREX  Place 1 drop into both eyes See admin instructions. He uses for 2 days prior to eye procedures.     VICTOZA [place]  Inject 1.2 Units into the skin daily. Pt unsure of dosage        Allergies:  Allergies  Allergen Reactions  . Benazepril Other (See Comments)    Reaction unknown - may have been headaches    Past Medical History  Diagnosis Date  . Monoclonal paraproteinemia   . Diabetes mellitus   . Hypertension   . Obesity   . Hyperlipidemia     Past Surgical History  Procedure Laterality Date  . Appendectomy    . Eye surgery    . Tonsillectomy      No family history on file.  Social History:  reports that he has quit smoking. His smoking use included Cigarettes. He smoked 0.00 packs per day. He does not have any smokeless tobacco history on file. He reports that he drinks about 1.2 oz of alcohol per week. He reports that he uses illicit drugs (Marijuana) about 7 times per week.  Review of Systems:  HYPERTENSION:  has had long-standing hypertension and blood pressure is not adequately controlled.  Again has not taken his lisinopril for 2 days and blood pressure is significantly high  HYPERLIPIDEMIA:  The lipid abnormality consists of elevated LDL treated with simvastatin.  Again not taking this as of 8/15, previously was compliant with good control   Lab Results  Component Value Date   CHOL 255* 07/17/2014   HDL 30.50* 07/17/2014   LDLCALC 191* 07/17/2014   LDLDIRECT 185.5 04/11/2014   TRIG 170.0* 07/17/2014   CHOLHDL 8 07/17/2014    Has had chemotherapy for myeloma previously    Examination:   BP 171/106 mmHg  Pulse 74  Temp(Src) 98.3 F (36.8 C)  Resp 16  Ht 6' 1.5" (1.867 m)  Wt 341 lb 9.6 oz (154.949 kg)  BMI 44.45 kg/m2  SpO2 97%  Body mass index is 44.45 kg/(m^2).   No pedal edema  Assesment/Plan:   1. Diabetes type 2, uncontrolled  The patient's diabetes control is fair  overall with his improved compliance with injectables  He has difficulty losing weight and needs to titrate up the Victoza Currently taking relatively large amounts of mealtime insulin up to 25 units  Recommendations made today include: Titrate Victoza gradually upto maximally tolerated dose or 1.2 mg  Adjust the NovoLog based on meal size  and may take higher doses at dinner if the readings are over 180 after eating He needs to walk or do home video exercises; again this was emphasized Need to be monitoring blood sugars at various times including after meals  Patient Instructions  Start exercise  EXTRA POTASSIUM in diet  Please check blood sugars at least half the time about 2 hours after any meal and times per week on waking up. Please bring blood sugar monitor to each visit   2. Currently  blood pressure is high and will give him HCTZ 25 mg as he cannot afford  to start back on lisinopril       Leilynn Pilat 07/22/2014, 7:08 PM

## 2014-08-25 ENCOUNTER — Other Ambulatory Visit: Payer: Medicare Other

## 2014-09-02 ENCOUNTER — Ambulatory Visit: Payer: Medicare Other | Admitting: Endocrinology

## 2014-09-15 ENCOUNTER — Other Ambulatory Visit (INDEPENDENT_AMBULATORY_CARE_PROVIDER_SITE_OTHER): Payer: Medicare Other

## 2014-09-15 DIAGNOSIS — E1165 Type 2 diabetes mellitus with hyperglycemia: Secondary | ICD-10-CM | POA: Diagnosis not present

## 2014-09-15 DIAGNOSIS — IMO0002 Reserved for concepts with insufficient information to code with codable children: Secondary | ICD-10-CM

## 2014-09-15 LAB — BASIC METABOLIC PANEL
BUN: 18 mg/dL (ref 6–23)
CHLORIDE: 106 meq/L (ref 96–112)
CO2: 24 mEq/L (ref 19–32)
CREATININE: 1.4 mg/dL (ref 0.4–1.5)
Calcium: 9.6 mg/dL (ref 8.4–10.5)
GFR: 69.6 mL/min (ref >60.00)
Glucose, Bld: 130 mg/dL — ABNORMAL HIGH (ref 70–99)
Potassium: 4.7 mEq/L (ref 3.5–5.1)
Sodium: 137 mEq/L (ref 135–145)

## 2014-09-15 LAB — HEMOGLOBIN A1C: HEMOGLOBIN A1C: 7.6 % — AB (ref 4.6–6.5)

## 2014-09-18 ENCOUNTER — Encounter: Payer: Self-pay | Admitting: Endocrinology

## 2014-09-18 ENCOUNTER — Ambulatory Visit (INDEPENDENT_AMBULATORY_CARE_PROVIDER_SITE_OTHER): Payer: Medicare Other | Admitting: Endocrinology

## 2014-09-18 VITALS — BP 118/72 | HR 78 | Temp 98.0°F | Resp 14 | Ht 73.5 in | Wt 337.8 lb

## 2014-09-18 DIAGNOSIS — E785 Hyperlipidemia, unspecified: Secondary | ICD-10-CM | POA: Diagnosis not present

## 2014-09-18 DIAGNOSIS — E1165 Type 2 diabetes mellitus with hyperglycemia: Secondary | ICD-10-CM

## 2014-09-18 DIAGNOSIS — IMO0002 Reserved for concepts with insufficient information to code with codable children: Secondary | ICD-10-CM

## 2014-09-18 DIAGNOSIS — I10 Essential (primary) hypertension: Secondary | ICD-10-CM

## 2014-09-18 DIAGNOSIS — Z23 Encounter for immunization: Secondary | ICD-10-CM

## 2014-09-18 MED ORDER — SILDENAFIL CITRATE 50 MG PO TABS
50.0000 mg | ORAL_TABLET | Freq: Every day | ORAL | Status: DC | PRN
Start: 2014-09-18 — End: 2016-01-11

## 2014-09-18 NOTE — Patient Instructions (Addendum)
Check coverage for Invokana and Tanzeum  Victoza 0.6mg  for 1 week and then go up 1 click every 3 days till maximally tolerated dose  Levemir 30 on pm dose to keep amsugar in 90-130 range  If sugars are up >180 after meals next week go up 5 on Novolog  HCTZ take 1/2 daily

## 2014-09-18 NOTE — Progress Notes (Signed)
Patient ID: Edward Clarke, male   DOB: Oct 03, 1965, 49 y.o.   MRN: [id number]   Reason for Appointment: Diabetes follow-up   History of Present Illness    Diagnosis: Type 2 diabetes mellitus, date of diagnosis: 2005.   He has been on basal bolus insulin regimen in the last few years along with metformin Most of the difficulty with control is related to his noncompliance with diet, medications, glucose monitoring and financial issues Previously had been running out of one or the other medication or insulin  and blood sugars would not be well controlled  Recent history:  He has been able to get for his medications and has been on his basal bolus insulin regimen Also he was started on Victoza which he he has had difficulty taking consistently On his visit in 11/17 he had been taking about 0.9 mg without side effects However with going up to 1.2 mg he was having significant nausea and anorexia and he stopped it 2 weeks ago However his blood sugars were significantly better when he was taking it and has been higher since then He has not adjusted his Levemir and NovoLog regimen despite recent high sugars and only takes extra NovoLog when blood sugars are high He was able to lose weight previously but now is gaining this back. He continues to take metformin but not clear if this is benefiting him A1c is excellent now at 7.6 compared to previous readings Glucose readings: He is again checking blood sugars mostly fasting and has only a few readings later in the day, mostly high late at night Has been reminded several times to exercise but he is not doing any formal exercise  INSULIN: Levemir 20 units--  25 units p.m.,  taking NovoLog before meals, 20-25 units ac Proper timing of medications in relation to meals: Yes   Monitors blood glucose:  1.3 times a day  Glucometer: One Touch.  Blood Glucose readings by download:   Am 025-427 with average about 150 Before lunch and supper 102-176 and  bedtime recently 183-351 OVERALL AVERAGE 155   No hypoglycemia recently  Meals: Usually 2 meals a day (supper 5-9 pm)    Carbohydrate intake: not controlled.  Food preferences: High carbohydrate; eating out at [facility] sometimes but not controlling portions  Physical activity: exercise: minimal  Wt Readings from Last 3 Encounters:  09/18/14 337 lb 12.8 oz (153.225 kg)  07/22/14 341 lb 9.6 oz (154.949 kg)  07/15/14 340 lb 1.6 oz (154.268 kg)    Retinal exam: Most recent: 2014.   The HbgA1c has been as high as 11.6 previously .  Lab Results  Component Value Date   HGBA1C 7.6* 09/15/2014   HGBA1C 8.5* 05/21/2014   HGBA1C 10.2* 02/07/2014   Lab Results  Component Value Date   MICROALBUR 0.9 07/17/2014   LDLCALC 191* 07/17/2014   CREATININE 1.4 09/15/2014        Medication List       This list is accurate as of: 09/18/14 11:59 PM.  Always use your most recent med list.               aspirin EC 81 MG tablet  Take 81 mg by mouth every morning.     glucose blood test strip  Commonly known as:  ONETOUCH VERIO  Use as instructed to check blood sugar 4 times per day dx code 250.02     hydrochlorothiazide 25 MG tablet  Commonly known as:  HYDRODIURIL  Take 1 tablet (25  mg total) by mouth daily.     ibuprofen 200 MG tablet  Commonly known as:  ADVIL,MOTRIN  Take 400-800 mg by mouth every 8 (eight) hours as needed for headache.     insulin aspart 100 UNIT/ML FlexPen  Commonly known as:  NOVOLOG FLEXPEN  Inject 5-20 Units into the skin See admin instructions. He uses a sliding scale.     LEVEMIR FLEXTOUCH [place]  Inject 25 Units into the skin 2 (two) times daily.     lisinopril 40 MG tablet  Commonly known as:  PRINIVIL,ZESTRIL  TAKE ONE TABLET BY MOUTH ONCE DAILY     metFORMIN 1000 MG tablet  Commonly known as:  GLUCOPHAGE  TAKE ONE TABLET BY MOUTH TWICE DAILY     multivitamin with minerals Tabs tablet  Take 1 tablet by mouth every morning.     sildenafil 50 MG  tablet  Commonly known as:  VIAGRA  Take 1 tablet (50 mg total) by mouth daily as needed for erectile dysfunction.     simvastatin 40 MG tablet  Commonly known as:  ZOCOR  Take one tablet by mouth at NIGHT.     tobramycin 0.3 % ophthalmic solution  Commonly known as:  TOBREX  Place 1 drop into both eyes See admin instructions. He uses for 2 days prior to eye procedures.     VICTOZA [place]  Inject 1.2 Units into the skin daily. Pt unsure of dosage        Allergies:  Allergies  Allergen Reactions  . Benazepril Other (See Comments)    Reaction unknown - may have been headaches    Past Medical History  Diagnosis Date  . Monoclonal paraproteinemia   . Diabetes mellitus   . Hypertension   . Obesity   . Hyperlipidemia     Past Surgical History  Procedure Laterality Date  . Appendectomy    . Eye surgery    . Tonsillectomy      No family history on file.  Social History:  reports that he has quit smoking. His smoking use included Cigarettes. He does not have any smokeless tobacco history on file. He reports that he drinks about 1.2 oz of alcohol per week. He reports that he uses illicit drugs (Marijuana) about 7 times per week.  Review of Systems:  HYPERTENSION:  has had long-standing hypertension and blood pressure is now controlled with resuming his medications He is taking HCTZ and maximum dose lisinopril  He is asking about treatment for erectile dysfunction which he has had for the last several months and he thinks it started after chemotherapy No change in libido  HYPERLIPIDEMIA:  The lipid abnormality consists of elevated LDL treated with simvastatin.  Has not had levels checked since he resumed his medication   Lab Results  Component Value Date   CHOL 255* 07/17/2014   HDL 30.50* 07/17/2014   LDLCALC 191* 07/17/2014   LDLDIRECT 185.5 04/11/2014   TRIG 170.0* 07/17/2014   CHOLHDL 8 07/17/2014    Has had chemotherapy for myeloma previously and has had good  results from recent lab studies by hematologist    Examination:   BP 118/72 mmHg  Pulse 78  Temp(Src) 98 F (36.7 C)  Resp 14  Ht 6' 1.5" (1.867 m)  Wt 337 lb 12.8 oz (153.225 kg)  BMI 43.96 kg/m2  SpO2 96%  Body mass index is 43.96 kg/(m^2).   No pedal edema  Assesment/Plan:   1. Diabetes type 2, uncontrolled  The patient's diabetes control  is fair overall with his improved compliance with insulin However with stopping his Victoza his blood sugars appear to be higher, recently over 200 fasting and as much as 358 postprandial He does not watch his diet consistently with not taking Victoza Asking about Invokana but this will not help him change his eating patterns Also unclear whether he has coverage for brand-name drugs with his current plan Discussed that unless he is able to take Victoza we will have to keep increasing his Levemir at least in the evening and probably suppertime NovoLog also.  Recommendations made today include: Titrate Victoza gradually starting with 0.6 mg upto maximally tolerated dose or 1.2 mg  Adjust the NovoLog based on meal size and may take higher doses at dinner if the readings are over 180 after eating He needs to walk or start Xbox video exercises; again reminded him about the importance of this Needs to be monitoring blood sugars at various times especially including after evening meal  2. HYPERTENSION: Currently  blood pressure is better controlled with having him take all his medications  3. Hyperlipidemia:He will will also need to be rechecked since he is able to get his Zocor now  4.  Erectile dysfunction.  Discussed treatment with Viagra and how this works and how to take it.  He will start with 50 mg.  Patient Instructions  Check coverage for Invokana and Tanzeum  Victoza 0.6mg  for 1 week and then go up 1 click every 3 days till maximally tolerated dose  Levemir 30 on pm dose to keep amsugar in 90-130 range  If sugars are up >180  after meals next week go up 5 on Novolog  HCTZ take 1/2 daily     Nakaila Freeze 09/19/2014, 8:07 AM

## 2014-10-10 ENCOUNTER — Telehealth: Payer: Self-pay | Admitting: Endocrinology

## 2014-10-10 ENCOUNTER — Other Ambulatory Visit: Payer: Self-pay | Admitting: Endocrinology

## 2014-10-10 NOTE — Telephone Encounter (Signed)
Please call in the replacement med for his metformin to [facility]

## 2014-10-10 NOTE — Telephone Encounter (Signed)
I looked in his last OV note but couldn't find what med you wanted to replace metformin with?  Please advise

## 2014-10-10 NOTE — Telephone Encounter (Signed)
Pt also needs new rx for hydrocorithiazide

## 2014-10-13 ENCOUNTER — Other Ambulatory Visit: Payer: Self-pay | Admitting: *Deleted

## 2014-10-13 MED ORDER — HYDROCHLOROTHIAZIDE 25 MG PO TABS: ORAL_TABLET | ORAL | Status: DC

## 2014-10-13 MED ORDER — METFORMIN HCL 1000 MG PO TABS: ORAL_TABLET | ORAL | Status: DC

## 2014-10-13 NOTE — Telephone Encounter (Signed)
He needs to continue metformin unchanged, HCTZ was supposed to be half of 25 mg or 12.5 mg daily

## 2014-10-20 ENCOUNTER — Telehealth: Payer: Self-pay | Admitting: Endocrinology

## 2014-10-20 NOTE — Telephone Encounter (Signed)
Pt is stopping Victoza he cannot take it anymore it makes him sick everyday

## 2014-10-23 NOTE — Telephone Encounter (Signed)
Called pt and advised him per Dr Arman Filter note. Pt understood. Pt to call on Monday with his b/s readings. Be advised.

## 2014-10-23 NOTE — Telephone Encounter (Signed)
OK, let us know about sugars on Monday. May need compensatory increase in insulin.

## 2014-10-23 NOTE — Telephone Encounter (Signed)
Please advise in Dr Ronnie Derby absence. Thank you.

## 2014-10-27 ENCOUNTER — Other Ambulatory Visit: Payer: Self-pay | Admitting: *Deleted

## 2014-10-27 MED ORDER — INSULIN DETEMIR 100 UNIT/ML FLEXPEN: 25.0000 [IU] | PEN_INJECTOR | Freq: Two times a day (BID) | SUBCUTANEOUS | Status: DC

## 2014-10-27 MED ORDER — INSULIN ASPART 100 UNIT/ML FLEXPEN
10.0000 [IU] | PEN_INJECTOR | SUBCUTANEOUS | Status: DC
Start: 2014-10-27 — End: 2014-10-27

## 2014-10-27 MED ORDER — INSULIN ASPART 100 UNIT/ML FLEXPEN: PEN_INJECTOR | SUBCUTANEOUS | Status: DC

## 2014-10-28 ENCOUNTER — Telehealth: Payer: Self-pay

## 2014-10-28 NOTE — Telephone Encounter (Signed)
Received a fax from [employer]. Novolog is not longer covered preferred alternative is Humalog.  Ok to change?

## 2014-10-28 NOTE — Telephone Encounter (Signed)
yes

## 2014-10-28 NOTE — Telephone Encounter (Signed)
Pt called to give prices to rhonda That rhonda requested levenmer 79.95 (copay)  And thew novo;;pg thaey couldn't give price because there wasn't a pecific unit pr day put on script

## 2014-10-31 ENCOUNTER — Telehealth: Payer: Self-pay | Admitting: Endocrinology

## 2014-10-31 MED ORDER — INSULIN LISPRO 100 UNIT/ML (KWIKPEN)
PEN_INJECTOR | SUBCUTANEOUS | Status: DC
Start: 2014-10-31 — End: 2014-11-11

## 2014-10-31 NOTE — Addendum Note (Signed)
Addended by: Moody Bruins E on: 10/31/2014 09:51 AM   Modules accepted: Orders

## 2014-10-31 NOTE — Telephone Encounter (Signed)
Pt out of levemir and with insurance it cost $75. Please suggest alternate. Please advise

## 2014-10-31 NOTE — Telephone Encounter (Signed)
See note below and please advise, thanks!  

## 2014-10-31 NOTE — Telephone Encounter (Signed)
Check the cost of Lantus or Toujeo

## 2014-10-31 NOTE — Telephone Encounter (Signed)
Lvom advising pt rx for Humalog has been sent because insurance no longer covers Novolog.

## 2014-11-04 NOTE — Telephone Encounter (Signed)
See below, I was unable to contact about price of Lantus or Toujeo.

## 2014-11-11 ENCOUNTER — Other Ambulatory Visit: Payer: Self-pay | Admitting: *Deleted

## 2014-11-11 MED ORDER — HYDROCHLOROTHIAZIDE 25 MG PO TABS: ORAL_TABLET | ORAL | Status: DC

## 2014-11-11 MED ORDER — SIMVASTATIN 40 MG PO TABS: ORAL_TABLET | ORAL | Status: DC

## 2014-11-11 MED ORDER — INSULIN LISPRO 100 UNIT/ML (KWIKPEN): PEN_INJECTOR | SUBCUTANEOUS | Status: DC

## 2014-11-11 MED ORDER — LISINOPRIL 40 MG PO TABS: ORAL_TABLET | ORAL | Status: DC

## 2014-11-11 NOTE — Telephone Encounter (Signed)
Patient will call to check and see if those meds are covered.

## 2014-11-12 ENCOUNTER — Telehealth: Payer: Self-pay | Admitting: Endocrinology

## 2014-11-12 NOTE — Telephone Encounter (Signed)
Call pt back about humalog says its too expensive

## 2014-11-12 NOTE — Telephone Encounter (Signed)
Check the cost of NovoLog and [facility] brand of Novolin R

## 2014-11-12 NOTE — Telephone Encounter (Signed)
Any advice?

## 2014-11-12 NOTE — Telephone Encounter (Signed)
lmtcb

## 2014-11-13 ENCOUNTER — Other Ambulatory Visit (INDEPENDENT_AMBULATORY_CARE_PROVIDER_SITE_OTHER): Payer: Medicare Other

## 2014-11-13 DIAGNOSIS — E785 Hyperlipidemia, unspecified: Secondary | ICD-10-CM | POA: Diagnosis not present

## 2014-11-13 DIAGNOSIS — E119 Type 2 diabetes mellitus without complications: Secondary | ICD-10-CM | POA: Diagnosis not present

## 2014-11-13 DIAGNOSIS — IMO0002 Reserved for concepts with insufficient information to code with codable children: Secondary | ICD-10-CM

## 2014-11-13 DIAGNOSIS — E1165 Type 2 diabetes mellitus with hyperglycemia: Secondary | ICD-10-CM | POA: Diagnosis not present

## 2014-11-13 LAB — BASIC METABOLIC PANEL
BUN: 13 mg/dL (ref 6–23)
CALCIUM: 9.8 mg/dL (ref 8.4–10.5)
CO2: 29 mEq/L (ref 19–32)
Chloride: 101 mEq/L (ref 96–112)
Creatinine, Ser: 1.45 mg/dL (ref 0.40–1.50)
GFR: 66.25 mL/min (ref >60.00)
GLUCOSE: 163 mg/dL — AB (ref 70–99)
POTASSIUM: 4.9 meq/L (ref 3.5–5.1)
SODIUM: 134 meq/L — AB (ref 135–145)

## 2014-11-13 LAB — LIPID PANEL
CHOL/HDL RATIO: 5
Cholesterol: 187 mg/dL (ref 0–200)
HDL: 40.1 mg/dL (ref >39.00)
NONHDL: 146.9
Triglycerides: 248 mg/dL — ABNORMAL HIGH (ref 0.0–149.0)
VLDL: 49.6 mg/dL — ABNORMAL HIGH (ref 0.0–40.0)

## 2014-11-13 LAB — LDL CHOLESTEROL, DIRECT: LDL DIRECT: 118 mg/dL

## 2014-11-17 LAB — FRUCTOSAMINE: Fructosamine: 307 umol/L — ABNORMAL HIGH (ref 190–270)

## 2014-11-20 ENCOUNTER — Encounter: Payer: Self-pay | Admitting: Endocrinology

## 2014-11-20 ENCOUNTER — Ambulatory Visit (INDEPENDENT_AMBULATORY_CARE_PROVIDER_SITE_OTHER): Payer: Medicare Other | Admitting: Endocrinology

## 2014-11-20 VITALS — BP 132/82 | HR 61 | Temp 97.7°F | Ht 73.5 in | Wt 334.0 lb

## 2014-11-20 DIAGNOSIS — I10 Essential (primary) hypertension: Secondary | ICD-10-CM

## 2014-11-20 DIAGNOSIS — E785 Hyperlipidemia, unspecified: Secondary | ICD-10-CM | POA: Diagnosis not present

## 2014-11-20 DIAGNOSIS — E1165 Type 2 diabetes mellitus with hyperglycemia: Secondary | ICD-10-CM | POA: Diagnosis not present

## 2014-11-20 DIAGNOSIS — IMO0002 Reserved for concepts with insufficient information to code with codable children: Secondary | ICD-10-CM

## 2014-11-20 NOTE — Progress Notes (Signed)
Patient ID: Edward Clarke, male   DOB: Aug 12, 1966, 49 y.o.   MRN: [id number]   Reason for Appointment: Diabetes follow-up   History of Present Illness    Diagnosis: Type 2 diabetes mellitus, date of diagnosis: 2005.   He has been on basal bolus insulin regimen in the last few years along with metformin Most of the difficulty with control is related to his noncompliance with diet, medications, glucose monitoring and financial issues Previously had been running out of one or the other medication or insulin  and blood sugars would not be well controlled  Recent history:  He has again had issues with getting his insulin because of cost factors He was previously taking Levemir and NovoLog No he says that he cannot afford his insulin and for the last month or so has not taken any mealtime insulin He is not taking only Lantus and he is getting this from a relative Because of not taking his mealtime insulin and increase the Lantus to 3 times a day Victoza: He thinks he was having side effects from this even at 0.6 mg and could not tolerate it because of nausea, has not taken this  His blood sugars have been quite erratic; was having readings as high as 311 about 10 days ago but more recently sugars have been mostly below 200 He has checked his blood sugars randomly and has only a few high readings in the last week His fructosamine still indicates overall high readings  However he has been able to start exercising for about a month and his weight is coming down. Diet: He thinks he is generally watching his meats but is using a lot of cheese regularly  INSULIN: Lantus 26 tid; was taking NovoLog before meals, 20-25 units ac Proper timing of medications in relation to meals: Yes   Monitors blood glucose:  1.3 times a day  Glucometer: One Touch.  Blood Glucose readings by download:    PRE-MEAL Breakfast Lunch Dinner Bedtime Overall  Glucose range: 114-311   153-243   156-238   139-205     Median:  181      179     No hypoglycemia recently  Meals: Usually 2 meals a day (supper 5-9 pm)    Carbohydrate intake: not controlled.  Food preferences: High carbohydrate; eating out at [facility] sometimes but not controlling portions  Physical activity: exercise: Recumbent bike for about a month almost daily  Wt Readings from Last 3 Encounters:  11/20/14 334 lb (151.501 kg)  09/18/14 337 lb 12.8 oz (153.225 kg)  07/22/14 341 lb 9.6 oz (154.949 kg)    Retinal exam: Most recent: 2014.   The HbgA1c has been as high as 11.6 previously .  Lab Results  Component Value Date   HGBA1C 7.6* 09/15/2014   HGBA1C 8.5* 05/21/2014   HGBA1C 10.2* 02/07/2014   Lab Results  Component Value Date   MICROALBUR 0.9 07/17/2014   LDLCALC 191* 07/17/2014   CREATININE 1.45 11/13/2014   No visits with results within 1 Week(s) from this visit. Latest known visit with results is:  Lab on 11/13/2014  Component Date Value Ref Range Status  . Sodium 11/13/2014 134* 135 - 145 mEq/L Final  . Potassium 11/13/2014 4.9  3.5 - 5.1 mEq/L Final  . Chloride 11/13/2014 101  96 - 112 mEq/L Final  . CO2 11/13/2014 29  19 - 32 mEq/L Final  . Glucose, Bld 11/13/2014 163* 70 - 99 mg/dL Final  . BUN 11/13/2014 13  6 - 23 mg/dL Final  . Creatinine, Ser 11/13/2014 1.45  0.40 - 1.50 mg/dL Final  . Calcium 11/13/2014 9.8  8.4 - 10.5 mg/dL Final  . GFR 11/13/2014 66.25  >60.00 mL/min Final  . Fructosamine 11/13/2014 307* 190 - 270 umol/L Final  . Cholesterol 11/13/2014 187  0 - 200 mg/dL Final   ATP III Classification       Desirable:  < 200 mg/dL               Borderline High:  200 - 239 mg/dL          High:  > = 240 mg/dL  . Triglycerides 11/13/2014 248.0* 0.0 - 149.0 mg/dL Final   Normal:  <150 mg/dLBorderline High:  150 - 199 mg/dL  . HDL 11/13/2014 40.10  >39.00 mg/dL Final  . VLDL 11/13/2014 49.6* 0.0 - 40.0 mg/dL Final  . Total CHOL/HDL Ratio 11/13/2014 5   Final                  Men          Women1/2  Average Risk     3.4          3.3Average Risk          5.0          4.42X Average Risk          9.6          7.13X Average Risk          15.0          11.0                      . NonHDL 11/13/2014 146.90   Final   NOTE:  Non-HDL goal should be 30 mg/dL higher than patient's LDL goal (i.e. LDL goal of < 70 mg/dL, would have non-HDL goal of < 100 mg/dL)  . Direct LDL 11/13/2014 118.0   Final   Optimal:  <100 mg/dLNear or Above Optimal:  100-129 mg/dLBorderline High:  130-159 mg/dLHigh:  160-189 mg/dLVery High:  >190 mg/dL        Medication List       This list is accurate as of: 11/20/14  5:11 PM.  Always use your most recent med list.               aspirin EC 81 MG tablet  Take 81 mg by mouth every morning.     glucose blood test strip  Commonly known as:  ONETOUCH VERIO  Use as instructed to check blood sugar 4 times per day dx code 250.02     hydrochlorothiazide 25 MG tablet  Commonly known as:  HYDRODIURIL  Take 1/2 tablet daily     ibuprofen 200 MG tablet  Commonly known as:  ADVIL,MOTRIN  Take 400-800 mg by mouth every 8 (eight) hours as needed for headache.     insulin aspart 100 UNIT/ML FlexPen  Commonly known as:  NOVOLOG FLEXPEN  Use max 60 units per day     Insulin Detemir 100 UNIT/ML Pen  Commonly known as:  LEVEMIR FLEXTOUCH  Inject 25 Units into the skin 2 (two) times daily.     insulin lispro 100 UNIT/ML KiwkPen  Commonly known as:  HUMALOG KWIKPEN  Use max of 60 units per day.     lisinopril 40 MG tablet  Commonly known as:  PRINIVIL,ZESTRIL  TAKE ONE TABLET BY MOUTH ONCE DAILY     metFORMIN  1000 MG tablet  Commonly known as:  GLUCOPHAGE  TAKE ONE TABLET BY MOUTH TWICE DAILY     multivitamin with minerals Tabs tablet  Take 1 tablet by mouth every morning.     sildenafil 50 MG tablet  Commonly known as:  VIAGRA  Take 1 tablet (50 mg total) by mouth daily as needed for erectile dysfunction.     simvastatin 40 MG tablet  Commonly known as:   ZOCOR  Take one tablet by mouth at NIGHT.     tobramycin 0.3 % ophthalmic solution  Commonly known as:  TOBREX  Place 1 drop into both eyes See admin instructions. He uses for 2 days prior to eye procedures.        Allergies:  Allergies  Allergen Reactions  . Benazepril Other (See Comments)    Reaction unknown - may have been headaches    Past Medical History  Diagnosis Date  . Monoclonal paraproteinemia   . Diabetes mellitus   . Hypertension   . Obesity   . Hyperlipidemia     Past Surgical History  Procedure Laterality Date  . Appendectomy    . Eye surgery    . Tonsillectomy      No family history on file.  Social History:  reports that he has quit smoking. His smoking use included Cigarettes. He does not have any smokeless tobacco history on file. He reports that he drinks about 1.2 oz of alcohol per week. He reports that he uses illicit drugs (Marijuana) about 7 times per week.  Review of Systems:  HYPERTENSION:  has had long-standing hypertension and blood pressure is now controlled with resuming his medications He is taking HCTZ and maximum dose lisinopril  He is asking about treatment for erectile dysfunction which he has had for the last several months and he thinks it started after chemotherapy No change in libido  HYPERLIPIDEMIA:  The lipid abnormality consists of elevated LDL treated with simvastatin.  Has not had levels checked since he resumed his medication   Lab Results  Component Value Date   CHOL 187 11/13/2014   HDL 40.10 11/13/2014   LDLCALC 191* 07/17/2014   LDLDIRECT 118.0 11/13/2014   TRIG 248.0* 11/13/2014   CHOLHDL 5 11/13/2014    Has had chemotherapy for myeloma previously and has had good results from recent lab studies by hematologist    Examination:   BP 132/82 mmHg  Pulse 61  Temp(Src) 97.7 F (36.5 C) (Oral)  Ht 6' 1.5" (1.867 m)  Wt 334 lb (151.501 kg)  BMI 43.46 kg/m2  SpO2 97%  Body mass index is 43.46 kg/(m^2).    No pedal edema  Assesment/Plan:   1. Diabetes type 2, uncontrolled  The patient's diabetes control is fair overall but has difficulty again getting his insulin consistently He has now stopped taking his mealtime insulin and taking higher doses of basal insulin without consistent control However he appears to be benefiting from starting his exercise program with less tendency to hyperglycemia and some weight loss He does not want to take Victoza which she thinks causes nausea even at the lowest dose See history of present illness for current blood sugar patterns and problems identified  Recommendations made today:  Start mealtime insulin as before; discussed postprandial targets and may adjust the dose based on his size and carbohydrate intake  Most likely needs about 15-20 units at meals based on the meal size Reduce Lantus to twice daily and discussed how to adjust the dose based  on fasting reading Lower fat diet. Check coverage for Bydureon which may be helpful with his diabetes control and weight loss especially with his having difficulty controlling portions Avoid excessive snacks at night but may need coverage for large or snacks if he has any, this was discussed also  2. HYPERTENSION: Currently  blood pressure is  controlled with his being better compliant with his medications  3. Hyperlipidemia:He will possibly need to change to Lipitor as he still has a high LDL despite reporting better compliance with his medication  4.  Renal function is borderline and will need to continue monitoring this.  Consider reducing lisinopril   Patient Instructions  Check coverage for Bydureon  Get all low fat cheeses  Humalog before meals and large snacks  Please check blood sugars at least half the time about 2 hours after any meal and 3 times per week on waking up. Please bring blood sugar monitor to each visit. Recommended blood sugar levels about 2 hours after meal is 140-180 and on  waking up 90-130  Reduce lantus when am sugar <110   Counseling time over 50% of today's 25 minute visit  Corbin Falck 11/20/2014, 5:11 PM

## 2014-11-20 NOTE — Patient Instructions (Addendum)
Check coverage for Bydureon  Get all low fat cheeses  Humalog before meals and large snacks  Please check blood sugars at least half the time about 2 hours after any meal and 3 times per week on waking up. Please bring blood sugar monitor to each visit. Recommended blood sugar levels about 2 hours after meal is 140-180 and on waking up 90-130  Reduce lantus when am sugar <110

## 2014-12-12 DIAGNOSIS — H34813 Central retinal vein occlusion, bilateral: Secondary | ICD-10-CM | POA: Diagnosis not present

## 2014-12-12 DIAGNOSIS — E11349 Type 2 diabetes mellitus with severe nonproliferative diabetic retinopathy without macular edema: Secondary | ICD-10-CM | POA: Diagnosis not present

## 2014-12-12 DIAGNOSIS — H3581 Retinal edema: Secondary | ICD-10-CM | POA: Diagnosis not present

## 2015-01-14 ENCOUNTER — Ambulatory Visit (HOSPITAL_BASED_OUTPATIENT_CLINIC_OR_DEPARTMENT_OTHER): Payer: Medicare Other | Admitting: Oncology

## 2015-01-14 ENCOUNTER — Other Ambulatory Visit (HOSPITAL_BASED_OUTPATIENT_CLINIC_OR_DEPARTMENT_OTHER): Payer: Medicare Other

## 2015-01-14 VITALS — BP 133/92 | HR 60 | Temp 97.7°F | Resp 18 | Ht 73.5 in | Wt 336.8 lb

## 2015-01-14 DIAGNOSIS — D472 Monoclonal gammopathy: Secondary | ICD-10-CM

## 2015-01-14 DIAGNOSIS — D6481 Anemia due to antineoplastic chemotherapy: Secondary | ICD-10-CM | POA: Diagnosis not present

## 2015-01-14 DIAGNOSIS — C880 Waldenstrom macroglobulinemia: Secondary | ICD-10-CM

## 2015-01-14 LAB — COMPREHENSIVE METABOLIC PANEL (CC13)
ALT: 21 U/L (ref 0–55)
ANION GAP: 7 meq/L (ref 3–11)
AST: 19 U/L (ref 5–34)
Albumin: 3.9 g/dL (ref 3.5–5.0)
Alkaline Phosphatase: 80 U/L (ref 40–150)
BILIRUBIN TOTAL: 0.44 mg/dL (ref 0.20–1.20)
BUN: 8.1 mg/dL (ref 7.0–26.0)
CO2: 27 meq/L (ref 22–29)
CREATININE: 1.2 mg/dL (ref 0.7–1.3)
Calcium: 9 mg/dL (ref 8.4–10.4)
Chloride: 105 mEq/L (ref 98–109)
EGFR: 79 mL/min/{1.73_m2} — ABNORMAL LOW (ref >90)
GLUCOSE: 109 mg/dL (ref 70–140)
Potassium: 4.3 mEq/L (ref 3.5–5.1)
Sodium: 139 mEq/L (ref 136–145)
Total Protein: 6.4 g/dL (ref 6.4–8.3)

## 2015-01-14 LAB — CBC WITH DIFFERENTIAL/PLATELET
BASO%: 0.9 % (ref 0.0–2.0)
BASOS ABS: 0 10*3/uL (ref 0.0–0.1)
EOS ABS: 0.3 10*3/uL (ref 0.0–0.5)
EOS%: 8.2 % — ABNORMAL HIGH (ref 0.0–7.0)
HCT: 37.6 % — ABNORMAL LOW (ref 38.4–49.9)
HEMOGLOBIN: 12.9 g/dL — AB (ref 13.0–17.1)
LYMPH%: 35.3 % (ref 14.0–49.0)
MCH: 31.1 pg (ref 27.2–33.4)
MCHC: 34.3 g/dL (ref 32.0–36.0)
MCV: 90.6 fL (ref 79.3–98.0)
MONO#: 0.5 10*3/uL (ref 0.1–0.9)
MONO%: 13.8 % (ref 0.0–14.0)
NEUT%: 41.8 % (ref 39.0–75.0)
NEUTROS ABS: 1.4 10*3/uL — AB (ref 1.5–6.5)
Platelets: 198 10*3/uL (ref 140–400)
RBC: 4.15 10*6/uL — ABNORMAL LOW (ref 4.20–5.82)
RDW: 13.1 % (ref 11.0–14.6)
WBC: 3.4 10*3/uL — ABNORMAL LOW (ref 4.0–10.3)
lymph#: 1.2 10*3/uL (ref 0.9–3.3)

## 2015-01-14 NOTE — Progress Notes (Signed)
Hematology and Oncology Follow Up Visit  Edward Clarke [id number] 03-24-1966 49 y.o. 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  He had presented with IgM about 3 g/dL and M-spike about 2 g/dL.  His workup that includes imaging studies, CT scan of the chest, abdomen and pelvis and skeletal survey had been really unrevealing. Bone marrow biopsy confirmed the diagnosis.    Previous therapy: Started chemotherapy with Bendamustine and Rituxan on 02/14/13. He is S/P cycle 6 on 07/04/2013 with CR.   Current therapy: Observation and surveillance.  Interim History:  Edward Clarke presents today for a follow-up visit with his wife. Since his last visit, he continues to do very well. He is exercising more at this time and of lost some weight. He is only on Lantus for his diabetes and his blood sugars have been in excellent control. He states his energy level is back to normal. He reports no dyspnea on exertion. He Denies chest pain. No rashes.  He has not reported any alternation in his mental status. He did not report any neurological symptoms.  Overall, performance status and activity level is back to baseline. He is not reporting any fevers chills or sweats. He reports no hospitalization or illnesses. He has not reported any constitutional symptoms of fevers or chills or sweats. Has not reported any alteration of mental status. The report any confusion or neurological deficits. Rest of his review of system is unremarkable.  Medications: I have reviewed the patient's current medications.  Current Outpatient Prescriptions  Medication Sig Dispense Refill  . aspirin EC 81 MG tablet Take 81 mg by mouth every morning.    [name] glucose blood (ONETOUCH VERIO) test strip Use as instructed to check blood sugar 4 times per day dx code 250.02 125 each 5  .  hydrochlorothiazide (HYDRODIURIL) 25 MG tablet Take 1/2 tablet daily 15 tablet 3  . ibuprofen (ADVIL,MOTRIN) 200 MG tablet Take 400-800 mg by mouth every 8 (eight) hours as needed for headache.    . Insulin Detemir (LEVEMIR FLEXTOUCH) 100 UNIT/ML Pen Inject 25 Units into the skin 2 (two) times daily. 15 mL 3  . insulin lispro (HUMALOG KWIKPEN) 100 UNIT/ML KiwkPen Use max of 60 units per day. 30 mL 2  . lisinopril (PRINIVIL,ZESTRIL) 40 MG tablet TAKE ONE TABLET BY MOUTH ONCE DAILY 30 tablet 3  . metFORMIN (GLUCOPHAGE) 1000 MG tablet TAKE ONE TABLET BY MOUTH TWICE DAILY 60 tablet 3  . Multiple Vitamin (MULTIVITAMIN WITH MINERALS) TABS Take 1 tablet by mouth every morning.    . sildenafil (VIAGRA) 50 MG tablet Take 1 tablet (50 mg total) by mouth daily as needed for erectile dysfunction. 10 tablet 0  . simvastatin (ZOCOR) 40 MG tablet Take one tablet by mouth at NIGHT. 30 tablet 3  . tobramycin (TOBREX) 0.3 % ophthalmic solution Place 1 drop into both eyes See admin instructions. He uses for 2 days prior to eye procedures.     No current facility-administered medications for this visit.     Allergies:  Allergies  Allergen Reactions  . Benazepril Other (See Comments)    Reaction unknown - may have been headaches    Past Medical History, Surgical history, Social history, and Family History were reviewed and updated.    Physical Exam: Blood pressure 133/92, pulse 60, temperature 97.7 F (36.5 C), temperature source Oral, resp. rate 18,  height 6' 1.5" (1.867 m), weight 336 lb 12.8 oz (152.771 kg), SpO2 99 %. ECOG: 1 General appearance: alert awake not in any distress. Head: Normocephalic, without obvious abnormality, atraumatic Neck: no adenopathy Lymph nodes: Cervical, supraclavicular, and axillary nodes normal. Heart:regular rate and rhythm, S1, S2 normal, no murmur, click, rub or gallop Lung:chest clear, no wheezing, rales, normal symmetric air entry Abdomen: soft, non-tender, without  masses or organomegaly EXT:no erythema, induration, or nodules   Lab Results: Lab Results  Component Value Date   WBC 3.4* 01/14/2015   HGB 12.9* 01/14/2015   HCT 37.6* 01/14/2015   MCV 90.6 01/14/2015   PLT 198 01/14/2015     Chemistry      Component Value Date/Time   NA 134* 11/13/2014 1548   NA 138 07/08/2014 1036   K 4.9 11/13/2014 1548   K 4.1 07/08/2014 1036   CL 101 11/13/2014 1548   CL 101 02/14/2013 0758   CO2 29 11/13/2014 1548   CO2 22 07/08/2014 1036   BUN 13 11/13/2014 1548   BUN 6.9* 07/08/2014 1036   CREATININE 1.45 11/13/2014 1548   CREATININE 1.1 07/08/2014 1036      Component Value Date/Time   CALCIUM 9.8 11/13/2014 1548   CALCIUM 8.6 07/08/2014 1036   ALKPHOS 69 07/17/2014 1139   ALKPHOS 67 07/08/2014 1036   AST 15 07/17/2014 1139   AST 16 07/08/2014 1036   ALT 16 07/17/2014 1139   ALT 19 07/08/2014 1036   BILITOT 0.6 07/17/2014 1139   BILITOT 0.49 07/08/2014 1036      Results for Edward Clarke (MRN [id number]) as of Clarke 10:32  Ref. Range 08/20/2013 10:43 11/12/2013 09:56 02/26/2014 15:09 07/08/2014 10:36  M-SPIKE, % Latest Units: g/dL 0.37 0.26 0.22 0.17  SPE Interp. Unknown * * * *  IgG (Immunoglobin G), Serum Latest Ref Range: [phone] mg/dL [phone]     Impression and Plan:  This is a 49 year old gentleman with the following issues: 1. 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  He is status post 6 cycles of bendamustine and Rituxan completed in October 2014 and achieved complete response. His protein studies from November 2015 showed complete normalization at this time. The plan is to continue with active surveillance and institute chemotherapy only upon relapse. 2. Anemia: likely related to #1 and recent chemotherapy.  His hemoglobin has normalized. 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 4. Follow up in 12 months.     Edward Clarke 5/11/201610:47 AM

## 2015-01-16 LAB — SPEP & IFE WITH QIG
ALPHA-2-GLOBULIN: 0.6 g/dL (ref 0.5–0.9)
Abnormal Protein Band1: 0.2 g/dL
Albumin ELP: 4.4 g/dL (ref 3.8–4.8)
Alpha-1-Globulin: 0.3 g/dL (ref 0.2–0.3)
BETA 2: 0.3 g/dL (ref 0.2–0.5)
Beta Globulin: 0.3 g/dL — ABNORMAL LOW (ref 0.4–0.6)
Gamma Globulin: 0.6 g/dL — ABNORMAL LOW (ref 0.8–1.7)
IGA: 19 mg/dL — AB (ref 68–379)
IGG (IMMUNOGLOBIN G), SERUM: 766 mg/dL (ref 650–1600)
IgM, Serum: 73 mg/dL (ref 41–251)
Total Protein, Serum Electrophoresis: 6.5 g/dL (ref 6.1–8.1)

## 2015-01-19 ENCOUNTER — Other Ambulatory Visit: Payer: Medicare Other

## 2015-01-22 ENCOUNTER — Ambulatory Visit (INDEPENDENT_AMBULATORY_CARE_PROVIDER_SITE_OTHER): Payer: Medicare Other | Admitting: Endocrinology

## 2015-01-22 ENCOUNTER — Encounter: Payer: Self-pay | Admitting: Endocrinology

## 2015-01-22 VITALS — BP 152/98 | HR 65 | Temp 97.8°F | Resp 16 | Ht 73.5 in | Wt 334.0 lb

## 2015-01-22 DIAGNOSIS — I10 Essential (primary) hypertension: Secondary | ICD-10-CM

## 2015-01-22 DIAGNOSIS — E785 Hyperlipidemia, unspecified: Secondary | ICD-10-CM | POA: Diagnosis not present

## 2015-01-22 DIAGNOSIS — IMO0002 Reserved for concepts with insufficient information to code with codable children: Secondary | ICD-10-CM

## 2015-01-22 DIAGNOSIS — E1165 Type 2 diabetes mellitus with hyperglycemia: Secondary | ICD-10-CM | POA: Diagnosis not present

## 2015-01-22 LAB — HEMOGLOBIN A1C: Hgb A1c MFr Bld: 7.9 % — ABNORMAL HIGH (ref 4.6–6.5)

## 2015-01-22 NOTE — Progress Notes (Signed)
Patient ID: Edward Clarke, male   DOB: Nov 25, 1965, 49 y.o.   MRN: [id number]   Reason for Appointment: Diabetes follow-up   History of Present Illness    Diagnosis: Type 2 diabetes mellitus, date of diagnosis: 2005.   He has been on basal bolus insulin regimen in the last few years along with metformin Most of the difficulty with control is related to his noncompliance with diet, medications, glucose monitoring and financial issues Previously had been running out of one or the other medication or insulin  and blood sugars would not be well controlled  Recent history:  He has again has issues with getting his insulin because of cost factors He was previously taking LANTUS and NovoLog No he says that he cannot afford his insulin and since his last visit has not the Novolog He is still on only Lantus and he is getting this from a relative Because of not taking his mealtime insulin he is taking the Lantus 3 times a day He was told to check on the cost of Bydureon but he did not do so and most likely cannot afford any brand-name medications  Current blood sugar patterns and problems:  He is checking blood sugars primarily in the morning and only randomly later in the day at night  He does not watch his diet and portions especially carbohydrates and blood sugar this morning was 307 from poor diet last night.  Sometimes will eat a lot of cheese or high-fat meals or snacks  However occasionally his blood sugars can be as low as 99 fasting  Blood sugars are variable nonfasting but mostly high  No hypoglycemia  He still not motivated to watch his diet or lose weight although he thinks he is trying to exercise His A1c is pending but generally has been over 8%  His wife is present today and was able to contribute to his discussion  INSULIN: Lantus 26 tid; was taking NovoLog before meals, 20-25 units ac Proper timing of medications in relation to meals: Yes   Monitors blood  glucose:  1.3 times a day  Glucometer: One Touch.  Blood Glucose readings by download:    PRE-MEAL Breakfast Lunch Dinner Bedtime Overall  Glucose range:  99-307     114-336    Mean/median:  170     260   164     No hypoglycemia recently  Meals: Usually 2 meals a day (supper 5-9 pm)    Carbohydrate intake: not controlled.  Food preferences: variable eating out at [facility] sometimes but not controlling portions  Physical activity: exercise: Recumbent bike almost daily  Wt Readings from Last 3 Encounters:  01/22/15 334 lb (151.501 kg)  01/14/15 336 lb 12.8 oz (152.771 kg)  11/20/14 334 lb (151.501 kg)     The HbgA1c has been as high as 11.6 previously .  Lab Results  Component Value Date   HGBA1C 7.9* 01/22/2015   HGBA1C 7.6* 09/15/2014   HGBA1C 8.5* 05/21/2014   Lab Results  Component Value Date   MICROALBUR 0.9 07/17/2014   LDLCALC 191* 07/17/2014   CREATININE 1.2 01/14/2015         Medication List       This list is accurate as of: 01/22/15  5:05 PM.  Always use your most recent med list.               aspirin EC 81 MG tablet  Take 81 mg by mouth every morning.  glucose blood test strip  Commonly known as:  ONETOUCH VERIO  Use as instructed to check blood sugar 4 times per day dx code 250.02     ibuprofen 200 MG tablet  Commonly known as:  ADVIL,MOTRIN  Take 400-800 mg by mouth every 8 (eight) hours as needed for headache.     Insulin Detemir 100 UNIT/ML Pen  Commonly known as:  LEVEMIR FLEXTOUCH  Inject 25 Units into the skin 2 (two) times daily.     Insulin Glargine 100 UNIT/ML Solostar Pen  Commonly known as:  LANTUS  Inject 25 Units into the skin 2 (two) times daily.     insulin lispro 100 UNIT/ML KiwkPen  Commonly known as:  HUMALOG KWIKPEN  Use max of 60 units per day.     sildenafil 50 MG tablet  Commonly known as:  VIAGRA  Take 1 tablet (50 mg total) by mouth daily as needed for erectile dysfunction.     tobramycin 0.3 % ophthalmic  solution  Commonly known as:  TOBREX  Place 1 drop into both eyes See admin instructions. He uses for 2 days prior to eye procedures.        Allergies:  Allergies  Allergen Reactions  . Benazepril Other (See Comments)    Reaction unknown - may have been headaches    Past Medical History  Diagnosis Date  . Monoclonal paraproteinemia   . Diabetes mellitus   . Hypertension   . Obesity   . Hyperlipidemia     Past Surgical History  Procedure Laterality Date  . Appendectomy    . Eye surgery    . Tonsillectomy      Family History  Problem Relation Age of Onset  . Diabetes Sister     Social History:  reports that he has quit smoking. His smoking use included Cigarettes. He does not have any smokeless tobacco history on file. He reports that he drinks about 1.2 oz of alcohol per week. He reports that he uses illicit drugs (Marijuana) about 7 times per week.  Review of Systems:  HYPERTENSION:  has had long-standing hypertension and blood pressure is now not controlled with his stopping his medications because he could not afford them even though they are genetic He was taking HCTZ and maximum dose lisinopril  HYPERLIPIDEMIA:  The lipid abnormality consists of elevated LDL treated with simvastatin.  Has relatively better level in March but still not at target   Lab Results  Component Value Date   CHOL 187 11/13/2014   HDL 40.10 11/13/2014   LDLCALC 191* 07/17/2014   LDLDIRECT 118.0 11/13/2014   TRIG 248.0* 11/13/2014   CHOLHDL 5 11/13/2014    Has had chemotherapy for myeloma previously and has had good results from recent lab studies by hematologist    Examination:   BP 152/98 mmHg  Pulse 65  Temp(Src) 97.8 F (36.6 C)  Resp 16  Ht 6' 1.5" (1.867 m)  Wt 334 lb (151.501 kg)  BMI 43.46 kg/m2  SpO2 97%  Body mass index is 43.46 kg/(m^2).   No pedal edema  Assesment/Plan:   1. Diabetes type 2, uncontrolled 2.  See history of present illness for current  blood sugar patterns and problems identified  The patient's diabetes control is inconsistent He is taking large doses of basal insulin and not taking any mealtime coverage causing mostly hypoattenuating readings Also as discussed in detail above he is having variable readings in the mornings and most of his testing is in the morning  only He has not been taking mealtime coverage and discussed need to do this.  He is agreeable to taking the [employer] brand regular insulin Also discussed option of V-go pump and described this in detail as an better option for him and how this would work  Recommendations made today:  Start mealtime insulin with Regular Insulin when he can afford it. Again discussed postprandial targets and may adjust the dose based on his size and carbohydrate intake  Most likely needs about 15-20 units at meals based on the meal size Reduce Lantus to twice daily when starting regular insulin Lower fat diet.  Check coverage for V-go pump, the form was faxed to the company and he can start this if it is affordable  2. HYPERTENSION: Currently  blood pressure is poorly controlled with his being poorly compliant with his medications and nourished to restart them  3. Hyperlipidemia: He will need repeat levels    Patient Instructions  Check blood sugars on waking up ..  .. times a week Also check blood sugars about 2 hours after a meal and do this after different meals by rotation  Recommended blood sugar levels on waking up is 90-130 and about 2 hours after meal is 140-180 Please bring blood sugar monitor to each visit.  Regular insulin 15-20 before meals and take Lantus 2x daily  Schedule V-go pump with Vaughan Basta     Counseling time over 50% of today's 25 minute visit on diabetes management and compliance and other issues as above  Arleigh Odowd 01/22/2015, 5:05 PM   Note: This office note was prepared with Estate agent. Any transcriptional  errors that result from this process are unintentional.

## 2015-01-22 NOTE — Patient Instructions (Addendum)
Check blood sugars on waking up ..  .. times a week Also check blood sugars about 2 hours after a meal and do this after different meals by rotation  Recommended blood sugar levels on waking up is 90-130 and about 2 hours after meal is 140-180 Please bring blood sugar monitor to each visit.  Regular insulin 15-20 before meals and take Lantus 2x daily  Schedule V-go pump with Edward Clarke

## 2015-02-05 ENCOUNTER — Ambulatory Visit: Payer: Medicare Other | Admitting: Endocrinology

## 2015-02-10 ENCOUNTER — Other Ambulatory Visit: Payer: Self-pay | Admitting: *Deleted

## 2015-02-10 MED ORDER — GLUCOSE BLOOD VI STRP: ORAL_STRIP | Status: DC

## 2015-02-13 ENCOUNTER — Other Ambulatory Visit: Payer: Self-pay | Admitting: *Deleted

## 2015-02-13 ENCOUNTER — Encounter: Payer: Self-pay | Admitting: Endocrinology

## 2015-02-13 ENCOUNTER — Ambulatory Visit (INDEPENDENT_AMBULATORY_CARE_PROVIDER_SITE_OTHER): Payer: Medicare Other | Admitting: Endocrinology

## 2015-02-13 VITALS — BP 160/103 | HR 67 | Temp 98.0°F | Resp 16 | Ht 73.5 in | Wt 334.6 lb

## 2015-02-13 DIAGNOSIS — I10 Essential (primary) hypertension: Secondary | ICD-10-CM

## 2015-02-13 DIAGNOSIS — E1165 Type 2 diabetes mellitus with hyperglycemia: Secondary | ICD-10-CM

## 2015-02-13 DIAGNOSIS — E785 Hyperlipidemia, unspecified: Secondary | ICD-10-CM | POA: Diagnosis not present

## 2015-02-13 DIAGNOSIS — IMO0002 Reserved for concepts with insufficient information to code with codable children: Secondary | ICD-10-CM

## 2015-02-13 MED ORDER — LISINOPRIL 10 MG PO TABS: 10.0000 mg | ORAL_TABLET | Freq: Every day | ORAL | Status: DC

## 2015-02-13 MED ORDER — METFORMIN HCL 1000 MG PO TABS: 1000.0000 mg | ORAL_TABLET | Freq: Two times a day (BID) | ORAL | Status: DC

## 2015-02-13 NOTE — Patient Instructions (Signed)
Take 35 units Lantus twice daily  Check blood sugars on waking up .. 4 .. times a week Also check blood sugars about 2 hours after a meal and do this after different meals by rotation  Recommended blood sugar levels on waking up is 90-130 and about 2 hours after meal is 140-180 Please bring blood sugar monitor to each visit.

## 2015-02-13 NOTE — Progress Notes (Signed)
Patient ID: Edward Clarke, male   DOB: 06/20/1966, 49 y.o.   MRN: [id number]   Reason for Appointment: Diabetes follow-up   History of Present Illness    Diagnosis: Type 2 diabetes mellitus, date of diagnosis: 2005.   He has been on basal bolus insulin regimen in the last few years along with metformin Most of the difficulty with control is related to his noncompliance with diet, medications, glucose monitoring and financial issues Previously had been running out of one or the other medication or insulin  and blood sugars would not be well controlled  Recent history:  He was previously taking LANTUS and NovoLog along with metformin Again he says that he cannot afford his insulin and still has not started on mealtime insulin, was advised to start taking the [employer] brand He is still on only Lantus and he is getting this from a relative; despite asking him to take this only twice a day he is still taking it 3 times a day and will adjust the dose and timing based on his blood sugar at various times Again not able to afford a GLP-1 drug which likely would help him Weight loss is still difficult for him  Current blood sugar patterns and problems:  He is checking blood sugars more often but these are quite variable in the evenings  However his blood sugars are surprisingly overall better with average 144  He and his wife think that he is probably getting too many carbohydrates at times and occasionally has blood sugars over 300 after evening meal  Usually not hypoglycemia with current regimen of large doses of Lantus but occasionally before evening meal he may get a little shaky   primarily in the morning and only randomly later in the day at night  He does not watch his diet and portions especially carbohydrates and blood sugar this morning was 307 from poor diet last night.  Sometimes will eat a lot of cheese or high-fat meals or snacks  However occasionally his blood sugars  can be as low as 99 fasting  Blood sugars are variable nonfasting but mostly high  No hypoglycemia  He still not motivated to watch his diet or lose weight although he thinks he is trying to exercise His A1c is pending but generally has been over 8%  His wife is present today and was able to contribute to his discussion  INSULIN: Lantus 26 tid; was taking NovoLog before meals, 20-25 units ac Proper timing of medications in relation to meals: Yes   Monitors blood glucose: About 2 or more times a day Glucometer: One Touch.  Blood Glucose readings by download:    Mean values apply above for all meters except median for One Touch  PRE-MEAL Fasting Lunch Dinner Bedtime Overall  Glucose range:  97-170   90-226   96-239   74-319    Mean/median:  130     191   127+/-54    Meals: Usually 2 meals a day (supper 5-9 pm)    Carbohydrate intake: Variable  Food preferences: variable eating out at [place] sometimes but not controlling portions  Physical activity: exercise: Recumbent bike periodically  Wt Readings from Last 3 Encounters:  02/13/15 334 lb 9.6 oz (151.774 kg)  01/22/15 334 lb (151.501 kg)  01/14/15 336 lb 12.8 oz (152.771 kg)     The HbgA1c has been as high as 11.6 previously .  Lab Results  Component Value Date   HGBA1C 7.9* 01/22/2015  HGBA1C 7.6* 09/15/2014   HGBA1C 8.5* 05/21/2014   Lab Results  Component Value Date   MICROALBUR 0.9 07/17/2014   LDLCALC 191* 07/17/2014   CREATININE 1.2 01/14/2015         Medication List       This list is accurate as of: 02/13/15 11:59 PM.  Always use your most recent med list.               aspirin EC 81 MG tablet  Take 81 mg by mouth every morning.     glucose blood test strip  Commonly known as:  ONETOUCH VERIO  Use as instructed to check blood sugar 4 times per day dx code E11.65     ibuprofen 200 MG tablet  Commonly known as:  ADVIL,MOTRIN  Take 400-800 mg by mouth every 8 (eight) hours as needed for  headache.     Insulin Detemir 100 UNIT/ML Pen  Commonly known as:  LEVEMIR FLEXTOUCH  Inject 25 Units into the skin 2 (two) times daily.     Insulin Glargine 100 UNIT/ML Solostar Pen  Commonly known as:  LANTUS  Inject 25 Units into the skin 2 (two) times daily.     insulin lispro 100 UNIT/ML KiwkPen  Commonly known as:  HUMALOG KWIKPEN  Use max of 60 units per day.     lisinopril 10 MG tablet  Commonly known as:  PRINIVIL,ZESTRIL  Take 1 tablet (10 mg total) by mouth daily.     metFORMIN 1000 MG tablet  Commonly known as:  GLUCOPHAGE  Take 1 tablet (1,000 mg total) by mouth 2 (two) times daily with a meal.     sildenafil 50 MG tablet  Commonly known as:  VIAGRA  Take 1 tablet (50 mg total) by mouth daily as needed for erectile dysfunction.     tobramycin 0.3 % ophthalmic solution  Commonly known as:  TOBREX  Place 1 drop into both eyes See admin instructions. He uses for 2 days prior to eye procedures.        Allergies:  Allergies  Allergen Reactions  . Benazepril Other (See Comments)    Reaction unknown - may have been headaches    Past Medical History  Diagnosis Date  . Monoclonal paraproteinemia   . Diabetes mellitus   . Hypertension   . Obesity   . Hyperlipidemia     Past Surgical History  Procedure Laterality Date  . Appendectomy    . Eye surgery    . Tonsillectomy      Family History  Problem Relation Age of Onset  . Diabetes Sister     Social History:  reports that he has quit smoking. His smoking use included Cigarettes. He does not have any smokeless tobacco history on file. He reports that he drinks about 1.2 oz of alcohol per week. He reports that he uses illicit drugs (Marijuana) about 7 times per week.  Review of Systems:  HYPERTENSION:  has had long-standing hypertension and blood pressure is now not controlled with his still not getting his medications refilled He says he cannot afford the generic co-pay also He was taking HCTZ and  maximum dose lisinopril previously  HYPERLIPIDEMIA:  The lipid abnormality consists of elevated LDL treated with simvastatin.  Has relatively better level in March but still not at target   Lab Results  Component Value Date   CHOL 187 11/13/2014   HDL 40.10 11/13/2014   LDLCALC 191* 07/17/2014   LDLDIRECT 118.0 11/13/2014   TRIG  248.0* 11/13/2014   CHOLHDL 5 11/13/2014    Has had chemotherapy for myeloma previously and has had good results from recent lab studies by hematologist    Examination:   BP 160/103 mmHg  Pulse 67  Temp(Src) 98 F (36.7 C)  Resp 16  Ht 6' 1.5" (1.867 m)  Wt 334 lb 9.6 oz (151.774 kg)  BMI 43.54 kg/m2  SpO2 95%  Body mass index is 43.54 kg/(m^2).   No pedal edema  Assesment/Plan:   1. Diabetes type 2, uncontrolled  See history of present illness for current blood sugar patterns and problems identified  The patient's diabetes control is still inadequate with inconsistent control of postprandial readings by not taking any mealtime insulin Also still taking large amounts of Lantus without any insulin sensitizer and has difficulty losing weight Since he is getting Lantus free currently he wants to continue this but does not want to pay for any other medications even generic He also does not want to try the V-go pump Last A1c was 7.9  Recommendations made today:  Start mealtime insulin with Regular Insulin when he can afford it. Again discussed postprandial targets and may adjust the dose based on his size and carbohydrate intake  Most likely needs about 15-20 units at meals based on the meal size Reduce Lantus to twice daily starting with 35 units twice a day, discussed adjusting the morning dose based on after known readings and evening dose based on fasting blood sugar trend Discussed that he may still need mealtime coverage of his postprandial readings are still consistently high and he can take Novolin N from [place] He does need to restart  metformin and discussed benefits of doing this along with insulin and recent studies supporting this Lower carbohydrate intake More consistent exercise.  2. HYPERTENSION: Currently  blood pressure is poorly controlled with his being poorly compliant with his medications He agrees to start lisinopril Will need to continue monitoring renal function  3. Hyperlipidemia: He will need repeat levels on the next visit to ensure compliance; reminded him of the need to consistently control lipids and use statin drugs    Patient Instructions  Take 35 units Lantus twice daily  Check blood sugars on waking up .. 4 .. times a week Also check blood sugars about 2 hours after a meal and do this after different meals by rotation  Recommended blood sugar levels on waking up is 90-130 and about 2 hours after meal is 140-180 Please bring blood sugar monitor to each visit.    Counseling time over 50% of today's 25 minute visit on diabetes management and compliance and medications, glucose monitoring and other issues as above  Asya Derryberry 02/16/2015, 8:01 AM   Note: This office note was prepared with Estate agent. Any transcriptional errors that result from this process are unintentional.

## 2015-03-16 ENCOUNTER — Telehealth: Payer: Self-pay | Admitting: Endocrinology

## 2015-03-16 NOTE — Telephone Encounter (Signed)
Regular if he is going to combine it with Lantus

## 2015-03-16 NOTE — Telephone Encounter (Signed)
Noted, patient aware

## 2015-03-16 NOTE — Telephone Encounter (Signed)
Please advise pt of an alternate that he can get otc with no rx, for the insulin

## 2015-03-16 NOTE — Telephone Encounter (Signed)
Please advise if he needs relion R or N?

## 2015-04-13 ENCOUNTER — Other Ambulatory Visit: Payer: Self-pay | Admitting: *Deleted

## 2015-04-13 ENCOUNTER — Telehealth: Payer: Self-pay | Admitting: Endocrinology

## 2015-04-13 MED ORDER — GLUCOSE BLOOD VI STRP: ORAL_STRIP | Status: DC

## 2015-04-13 NOTE — Telephone Encounter (Signed)
rx resent  

## 2015-04-13 NOTE — Telephone Encounter (Signed)
Patient called stating that he needs a refill on his medication   Rx: Verio One Touch test strips   Pharmacy: [facility]   Thank you

## 2015-04-30 ENCOUNTER — Other Ambulatory Visit: Payer: Self-pay | Admitting: *Deleted

## 2015-04-30 MED ORDER — INSULIN REGULAR HUMAN 100 UNIT/ML IJ SOLN: INTRAMUSCULAR | Status: DC

## 2015-05-13 ENCOUNTER — Ambulatory Visit (INDEPENDENT_AMBULATORY_CARE_PROVIDER_SITE_OTHER): Payer: Medicare Other | Admitting: Endocrinology

## 2015-05-13 ENCOUNTER — Other Ambulatory Visit: Payer: Self-pay | Admitting: *Deleted

## 2015-05-13 ENCOUNTER — Encounter: Payer: Self-pay | Admitting: Endocrinology

## 2015-05-13 VITALS — BP 126/84 | HR 80 | Temp 98.1°F | Resp 16 | Ht 73.5 in | Wt 337.0 lb

## 2015-05-13 DIAGNOSIS — E1165 Type 2 diabetes mellitus with hyperglycemia: Secondary | ICD-10-CM | POA: Diagnosis not present

## 2015-05-13 DIAGNOSIS — IMO0002 Reserved for concepts with insufficient information to code with codable children: Secondary | ICD-10-CM

## 2015-05-13 LAB — POCT GLYCOSYLATED HEMOGLOBIN (HGB A1C): Hemoglobin A1C: 8

## 2015-05-13 MED ORDER — INSULIN REGULAR HUMAN 100 UNIT/ML IJ SOLN: INTRAMUSCULAR | Status: DC

## 2015-05-13 MED ORDER — SIMVASTATIN 40 MG PO TABS: ORAL_TABLET | ORAL | Status: DC

## 2015-05-13 NOTE — Patient Instructions (Addendum)
Novolin relion N insulin , 20 in am and bedtime Adjust bedtime dose based on am sugars  Restart Simvastatin

## 2015-05-13 NOTE — Progress Notes (Signed)
Patient ID: Edward Clarke, male   DOB: 1966-01-15, 49 y.o.   MRN: [id number]   Reason for Appointment: Diabetes follow-up   History of Present Illness    Diagnosis: Type 2 diabetes mellitus, date of diagnosis: 2005.   He has been on basal bolus insulin regimen in the last few years along with metformin Most of the difficulty with control is related to his noncompliance with diet, medications, glucose monitoring and financial issues Frequently will had been running out of one or the other medication or insulin  and blood sugars would not be well controlled  Recent history:  He is supposed to be taking LANTUS and NovoLog along with metformin  Again he says that he cannot afford his insulin and was using Lantus that he was getting from his sister and was taking this 3 times a day without any mealtime coverage Now he has run out of Lantus about 3 weeks ago and is taking only the [employer] brand Regular Insulin usually twice a day Again not able to afford a GLP-1 drug which likely would help him Weight loss is still out of reach for him  Current blood sugar patterns and problems:  He is checking blood sugars usually about twice a day  Without Lantus insulin his fasting readings are fairly consistently high but also has somewhat higher readings in the evenings before supper  He is taking 20-25 units of regular insulin based on his blood sugar level at mealtimes  He had been previously exercising but has not been motivated to do so  Again his compliance with diet is variable more often but these are quite variable in the evenings his average blood sugar is now 192 compared to 127 previously with taking relatively large doses of Lantus only   Rarely will have improved blood sugars with taking his morning insulin but no hypoglycemia  His wife is present today and  had some questions also  INSULIN: Lantus 26 tid; was taking NovoLog before meals, 20-25 units ac Proper timing of  medications in relation to meals: Yes   Monitors blood glucose: About 2 or more times a day Glucometer: One Touch.  Blood Glucose readings by download:    Mean values apply above for all meters except median for One Touch  PRE-MEAL Fasting Lunch Dinner Bedtime Overall  Glucose range: 135-263  193-267 162-264   Mean/median: 180  225  193+/-41   Meals: Usually 2 meals a day ( lunch 1 PM, supper 7-9 pm)    Carbohydrate intake: Variable  Food preferences: variable eating out at [place] sometimes but not controlling portions  Physical activity: exercise: was on Recumbent bike   Wt Readings from Last 3 Encounters:  05/13/15 337 lb (152.862 kg)  02/13/15 334 lb 9.6 oz (151.774 kg)  01/22/15 334 lb (151.501 kg)     The HbgA1c has been as high as 11.6 previously .  Lab Results  Component Value Date   HGBA1C 8.0 05/13/2015   HGBA1C 7.9* 01/22/2015   HGBA1C 7.6* 09/15/2014   Lab Results  Component Value Date   MICROALBUR 0.9 07/17/2014   LDLCALC 191* 07/17/2014   CREATININE 1.2 01/14/2015        Medication List       This list is accurate as of: 05/13/15  8:55 PM.  Always use your most recent med list.               aspirin EC 81 MG tablet  Take 81 mg by  mouth every morning.     glucose blood test strip  Commonly known as:  ONETOUCH VERIO  Use as instructed to check blood sugar 4 times per day dx code E11.65     ibuprofen 200 MG tablet  Commonly known as:  ADVIL,MOTRIN  Take 400-800 mg by mouth every 8 (eight) hours as needed for headache.     Insulin Glargine 100 UNIT/ML Solostar Pen  Commonly known as:  LANTUS  Inject 25 Units into the skin 2 (two) times daily.     insulin regular 100 units/mL injection  Commonly known as:  NOVOLIN R RELION  Inject 20 units two times a day with meals     lisinopril 10 MG tablet  Commonly known as:  PRINIVIL,ZESTRIL  Take 1 tablet (10 mg total) by mouth daily.     metFORMIN 1000 MG tablet  Commonly known as:  GLUCOPHAGE    Take 1 tablet (1,000 mg total) by mouth 2 (two) times daily with a meal.     sildenafil 50 MG tablet  Commonly known as:  VIAGRA  Take 1 tablet (50 mg total) by mouth daily as needed for erectile dysfunction.     simvastatin 40 MG tablet  Commonly known as:  ZOCOR  Take one tablet daily     tobramycin 0.3 % ophthalmic solution  Commonly known as:  TOBREX  Place 1 drop into both eyes See admin instructions. He uses for 2 days prior to eye procedures.        Allergies:  Allergies  Allergen Reactions  . Benazepril Other (See Comments)    Reaction unknown - may have been headaches    Past Medical History  Diagnosis Date  . Monoclonal paraproteinemia   . Diabetes mellitus   . Hypertension   . Obesity   . Hyperlipidemia     Past Surgical History  Procedure Laterality Date  . Appendectomy    . Eye surgery    . Tonsillectomy      Family History  Problem Relation Age of Onset  . Diabetes Sister     Social History:  reports that he has quit smoking. His smoking use included Cigarettes. He does not have any smokeless tobacco history on file. He reports that he drinks about 1.2 oz of alcohol per week. He reports that he uses illicit drugs (Marijuana) about 7 times per week.  Review of Systems:  HYPERTENSION:  has had long-standing hypertension and blood pressure is now  better controlled with starting back on lisinopril   HYPERLIPIDEMIA:  The lipid abnormality consists of elevated LDL treated with simvastatin.  He says he does not have any insurance and cannot afford to buy any simvastatin    Lab Results  Component Value Date   CHOL 187 11/13/2014   HDL 40.10 11/13/2014   LDLCALC 191* 07/17/2014   LDLDIRECT 118.0 11/13/2014   TRIG 248.0* 11/13/2014   CHOLHDL 5 11/13/2014    Has had chemotherapy for myeloma previously and has had good results, last protein level normal    Examination:   BP 126/84 mmHg  Pulse 80  Temp(Src) 98.1 F (36.7 C)  Resp 16  Ht  6' 1.5" (1.867 m)  Wt 337 lb (152.862 kg)  BMI 43.85 kg/m2  SpO2 95%  Body mass index is 43.85 kg/(m^2).   No pedal edema  Assesment/Plan:   1. Diabetes type 2, uncontrolled  See history of present illness for current blood sugar patterns, management  and problems identified  The patient's diabetes  control is  again for because of not getting basal insulin Although at one time he was taking NPH insulin he did not realize he could go back on it in addition to his regular insulin Previously with Lantus alone he had reasonably good control even without much mealtime doses Although his A1c is still around 8% his average blood sugar is nearly 200 at home recently He is fairly non-motivated to exercise or try to lose weight  Recommendations made today:  Discussed that  he can take Novolin N from [place] which will cost him about the same with the regular insulin Discussed actions of NPH insulin, timing of doses, adjustment of bedtime dose based on fasting readings and given him flowsheet to work on the titration.  He can mix to morning dose with the breakfast insulin otherwise if he is not eating his first meal early with taking NPH anyway on waking up Assistance form for getting Lantus was filled out today for him Continue regular insulin unchanged  He does need to  continue metformin Lower carbohydrate intake Need to start  consistent exercise.  2. HYPERTENSION: Currently  blood pressure is Better controlled with lisinopril   3. Hyperlipidemia: He will need  order a statin drug taken get on the $4 [employer] plan Advised him to check on this and call us back Continue metformin also    Patient Instructions  Novolin relion N insulin , 20 in am and bedtime Adjust bedtime dose based on am sugars  Restart Simvastatin   Counseling time over 50% of today's 25 minute visit   Nyema Hachey 05/13/2015, 8:55 PM   Note: This office note was prepared with Teacher, early years/pre. Any transcriptional errors that result from this process are unintentional.

## 2015-06-20 ENCOUNTER — Other Ambulatory Visit: Payer: Self-pay | Admitting: Endocrinology

## 2015-06-22 ENCOUNTER — Telehealth: Payer: Self-pay | Admitting: Endocrinology

## 2015-06-22 ENCOUNTER — Other Ambulatory Visit: Payer: Self-pay | Admitting: *Deleted

## 2015-06-22 MED ORDER — GLUCOSE BLOOD VI STRP: ORAL_STRIP | Status: DC

## 2015-06-22 NOTE — Telephone Encounter (Signed)
rx resent  

## 2015-06-22 NOTE — Telephone Encounter (Signed)
Pt needs new RX for verio one touch test strips with diagnostic code on it please so they will fill it  Please call into [facility] [address]

## 2015-06-23 ENCOUNTER — Other Ambulatory Visit (INDEPENDENT_AMBULATORY_CARE_PROVIDER_SITE_OTHER): Payer: Medicare Other

## 2015-06-23 ENCOUNTER — Other Ambulatory Visit: Payer: Medicare Other

## 2015-06-23 DIAGNOSIS — IMO0002 Reserved for concepts with insufficient information to code with codable children: Secondary | ICD-10-CM

## 2015-06-23 DIAGNOSIS — E1165 Type 2 diabetes mellitus with hyperglycemia: Secondary | ICD-10-CM

## 2015-06-23 LAB — COMPREHENSIVE METABOLIC PANEL
ALBUMIN: 4.6 g/dL (ref 3.5–5.2)
ALK PHOS: 68 U/L (ref 39–117)
ALT: 18 U/L (ref 0–53)
AST: 15 U/L (ref 0–37)
BUN: 13 mg/dL (ref 6–23)
CHLORIDE: 102 meq/L (ref 96–112)
CO2: 31 mEq/L (ref 19–32)
CREATININE: 1.33 mg/dL (ref 0.40–1.50)
Calcium: 9.8 mg/dL (ref 8.4–10.5)
GFR: 73.01 mL/min (ref >60.00)
GLUCOSE: 187 mg/dL — AB (ref 70–99)
POTASSIUM: 4.6 meq/L (ref 3.5–5.1)
Sodium: 137 mEq/L (ref 135–145)
TOTAL PROTEIN: 7.1 g/dL (ref 6.0–8.3)
Total Bilirubin: 0.4 mg/dL (ref 0.2–1.2)

## 2015-06-23 LAB — LIPID PANEL
CHOLESTEROL: 215 mg/dL — AB (ref 0–200)
HDL: 34.5 mg/dL — ABNORMAL LOW (ref >39.00)
LDL CALC: 144 mg/dL — AB (ref 0–99)
NONHDL: 180.98
Total CHOL/HDL Ratio: 6
Triglycerides: 184 mg/dL — ABNORMAL HIGH (ref 0.0–149.0)
VLDL: 36.8 mg/dL (ref 0.0–40.0)

## 2015-06-23 LAB — MICROALBUMIN / CREATININE URINE RATIO
Creatinine,U: 58 mg/dL
Microalb Creat Ratio: 1.2 mg/g (ref 0.0–30.0)

## 2015-06-26 ENCOUNTER — Ambulatory Visit (INDEPENDENT_AMBULATORY_CARE_PROVIDER_SITE_OTHER): Payer: Medicare Other | Admitting: Endocrinology

## 2015-06-26 ENCOUNTER — Encounter: Payer: Self-pay | Admitting: Endocrinology

## 2015-06-26 VITALS — BP 150/95 | HR 67 | Temp 98.4°F | Resp 16 | Ht 73.5 in | Wt 331.2 lb

## 2015-06-26 DIAGNOSIS — R6882 Decreased libido: Secondary | ICD-10-CM

## 2015-06-26 DIAGNOSIS — E1165 Type 2 diabetes mellitus with hyperglycemia: Secondary | ICD-10-CM | POA: Diagnosis not present

## 2015-06-26 DIAGNOSIS — I10 Essential (primary) hypertension: Secondary | ICD-10-CM

## 2015-06-26 DIAGNOSIS — Z794 Long term (current) use of insulin: Secondary | ICD-10-CM

## 2015-06-26 NOTE — Progress Notes (Signed)
Patient ID: Edward Clarke, male   DOB: 1965/09/13, 49 y.o.   MRN: [id number]   Reason for Appointment: Diabetes follow-up   History of Present Illness    Diagnosis: Type 2 diabetes mellitus, date of diagnosis: 2005.   He has been on basal bolus insulin regimen in the last few years along with metformin Most of the difficulty with control is related to his noncompliance with diet, medications, glucose monitoring and financial issues Frequently will had been running out of one or the other medication or insulin  and blood sugars would not be well controlled  Recent history:   INSULIN: Lantus 35 units bid; regular insulin rarely  He now has been able to get Lantus through the patient assistance program However he is very confused about whether he is taking another insulin when his blood sugar is high; not clear if he is taking regular or NPH but most likely is not taking any other insulin He is not understanding the actions of various kinds of insulin Last A1c was 8%  Current blood sugar patterns and problems:  He is checking blood sugars less frequently and mostly in the morning or late at night,: Not clear which readings are after meals at night  His fasting blood sugars have come down to near normal with Lantus  However he is taking 70 units a day, previously Lantus he was taking 52  He thinks that he is cutting back on starchy foods like notables and is able to lose a little weight  However is still not motivated to exercise  He takes his metformin irregularly and keeps running out of it  Proper timing of medications in relation to meals: Yes   Monitors blood glucose: About 1-2 times a day Glucometer: One Touch.  Blood Glucose readings by download:    Mean values apply above for all meters except median for One Touch  PRE-MEAL Fasting Lunch  3-6 PM  Bedtime Overall  Glucose range:  92-135    152, 193   167-265    Mean/median:     127 +/-50     Meals:  Usually 2 meals a day ( lunch 1 PM, supper 9 pm)     Carbohydrate intake: Variable  Food preferences: variable eating out at [place] sometimes but not controlling portions  Physical activity: exercise: not now, was on Recumbent bike previously his wife says that he is too busy playing video games   Wt Readings from Last 3 Encounters:  06/26/15 331 lb 3.2 oz (150.231 kg)  05/13/15 337 lb (152.862 kg)  02/13/15 334 lb 9.6 oz (151.774 kg)     The HbgA1c has been as high as 11.6 previously .  Lab Results  Component Value Date   HGBA1C 8.0 05/13/2015   HGBA1C 7.9* 01/22/2015   HGBA1C 7.6* 09/15/2014   Lab Results  Component Value Date   MICROALBUR <0.7 06/23/2015   LDLCALC 144* 06/23/2015   CREATININE 1.33 06/23/2015        Medication List       This list is accurate as of: 06/26/15  1:54 PM.  Always use your most recent med list.               aspirin EC 81 MG tablet  Take 81 mg by mouth every morning.     glucose blood test strip  Commonly known as:  ONETOUCH VERIO  Use as instructed to check blood sugar 4 times per day dx code E11.65  ibuprofen 200 MG tablet  Commonly known as:  ADVIL,MOTRIN  Take 400-800 mg by mouth every 8 (eight) hours as needed for headache.     Insulin Glargine 100 UNIT/ML Solostar Pen  Commonly known as:  LANTUS  Inject 35 Units into the skin 2 (two) times daily.     insulin regular 100 units/mL injection  Commonly known as:  NOVOLIN R RELION  Inject 20 units two times a day with meals     lisinopril 10 MG tablet  Commonly known as:  PRINIVIL,ZESTRIL  TAKE ONE TABLET BY MOUTH ONCE DAILY     metFORMIN 1000 MG tablet  Commonly known as:  GLUCOPHAGE  TAKE ONE TABLET BY MOUTH TWICE DAILY WITH MEALS     sildenafil 50 MG tablet  Commonly known as:  VIAGRA  Take 1 tablet (50 mg total) by mouth daily as needed for erectile dysfunction.     simvastatin 40 MG tablet  Commonly known as:  ZOCOR  Take one tablet daily     tobramycin  0.3 % ophthalmic solution  Commonly known as:  TOBREX  Place 1 drop into both eyes See admin instructions. He uses for 2 days prior to eye procedures.        Allergies:  Allergies  Allergen Reactions  . Benazepril Other (See Comments)    Reaction unknown - may have been headaches    Past Medical History  Diagnosis Date  . Monoclonal paraproteinemia   . Diabetes mellitus   . Hypertension   . Obesity   . Hyperlipidemia     Past Surgical History  Procedure Laterality Date  . Appendectomy    . Eye surgery    . Tonsillectomy      Family History  Problem Relation Age of Onset  . Diabetes Sister     Social History:  reports that he has quit smoking. His smoking use included Cigarettes. He does not have any smokeless tobacco history on file. He reports that he drinks about 1.2 oz of alcohol per week. He reports that he uses illicit drugs (Marijuana) about 7 times per week.  Review of Systems:  He has been complaining about lack of motivation and recently he thinks that his muscle strength and masses reduced.  Also has significantly decreased libido.  Has not had a testosterone level checked previously  HYPERTENSION:  has had long-standing hypertension and blood pressure is again higher as he ran out of lisinopril, was better controlled on his last visit  HYPERLIPIDEMIA:  The lipid abnormality consists of elevated LDL treated with simvastatin.  He does not take his medication consistently and does not refill it when it runs out  Lab Results  Component Value Date   CHOL 215* 06/23/2015   HDL 34.50* 06/23/2015   LDLCALC 144* 06/23/2015   LDLDIRECT 118.0 11/13/2014   TRIG 184.0* 06/23/2015   CHOLHDL 6 06/23/2015    Has had chemotherapy for myeloma previously and has had good results, now on annual follow-up    Examination:   BP 150/95 mmHg  Pulse 67  Temp(Src) 98.4 F (36.9 C)  Resp 16  Ht 6' 1.5" (1.867 m)  Wt 331 lb 3.2 oz (150.231 kg)  BMI 43.10 kg/m2  SpO2  98%  Body mass index is 43.1 kg/(m^2).   No pedal edema  Assesment/Plan:   1. Diabetes type 2, uncontrolled  See history of present illness for current blood sugar patterns, management  and problems identified  His blood sugars are somewhat better with starting  Lantus consistently However his postprandial readings are consistently high and he does not take any mealtime insulin As discussed above he does not understand the actions of various kind of insulins Although he is trying to lose weight but somewhat improved diet but he is not motivated to exercise even though he has a recumbent, bike at home  The patient's diabetes control is  again for because of not getting basal insulin Although at one time he was taking NPH insulin he did not realize he could go back on it in addition to his regular insulin Previously with Lantus alone he had reasonably good control even without much mealtime doses Although his A1c is still around 8% his average blood sugar is nearly 200 at home recently He is fairly non-motivated to exercise or try to lose weight  Recommendations made today:  Start taking regular insulin as directed, most likely will need 10-15 units based on carbohydrate intake.  May need larger doses for his evening meal Reduce morning Lantus by 5 units and may need to reduce evening dose also with starting evening Regular Insulin Follow-up with nurse educator Start exercising regularly Continue improving diet More blood sugars 2 hours after meals  2. HYPERTENSION: Currently  blood pressure is not controlled with his running out of lisinopril   3. Hyperlipidemia: He will need to take his medication more consistently as LDL is again high.  4.  Decreased energy, weakness and decreased libido.  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 and will have them check fasting labs, discussed possible treatments    Patient Instructions  LANTUS 30 in am and 35 in pm  Regular  insulin 10-15 units, 30 min before meals based on Carb intake  Start walking or biking  Check blood sugars on waking up 3-4  times a week Also check blood sugars about 2 hours after a meal and do this after different meals by rotation  Recommended blood sugar levels on waking up is 90-130 and about 2 hours after meal is 130-160  Please bring your blood sugar monitor to each visit, thank you    Counseling time on subjects discussed above is over 50% of today's 25 minute visit  Domonic Hiscox 06/26/2015, 1:54 PM   Note: This office note was prepared with Dragon voice recognition system technology. Any transcriptional errors that result from this process are unintentional.

## 2015-06-26 NOTE — Patient Instructions (Addendum)
LANTUS 30 in am and 35 in pm  Regular insulin 10-15 units, 30 min before meals based on Carb intake  Start walking or biking  Check blood sugars on waking up 3-4  times a week Also check blood sugars about 2 hours after a meal and do this after different meals by rotation  Recommended blood sugar levels on waking up is 90-130 and about 2 hours after meal is 130-160  Please bring your blood sugar monitor to each visit, thank you

## 2015-06-29 ENCOUNTER — Other Ambulatory Visit (INDEPENDENT_AMBULATORY_CARE_PROVIDER_SITE_OTHER): Payer: Medicare Other

## 2015-06-29 ENCOUNTER — Ambulatory Visit: Payer: Medicare Other | Admitting: Endocrinology

## 2015-06-29 DIAGNOSIS — R6882 Decreased libido: Secondary | ICD-10-CM

## 2015-06-29 LAB — LUTEINIZING HORMONE: LH: 5.66 m[IU]/mL (ref 1.50–9.30)

## 2015-06-30 LAB — TESTOSTERONE, FREE, TOTAL, SHBG
Sex Hormone Binding: 48 nmol/L (ref 10–50)
TESTOSTERONE-% FREE: 1.7 % (ref 1.6–2.9)
Testosterone, Free: 97.2 pg/mL (ref 47.0–244.0)
Testosterone: 577 ng/dL (ref 300–890)

## 2015-06-30 LAB — PROLACTIN: PROLACTIN: 7.7 ng/mL (ref 2.1–17.1)

## 2015-06-30 NOTE — Progress Notes (Signed)
Quick Note:  Please let patient know that the testosterone levels are normal ______

## 2015-07-23 ENCOUNTER — Telehealth: Payer: Self-pay | Admitting: *Deleted

## 2015-07-23 NOTE — Telephone Encounter (Signed)
I left a message on Mr. Edward Clarke phone letting him know he needs to check more sugars after meals and less in the morning per Dr. Dwyane Dee.

## 2015-08-03 ENCOUNTER — Telehealth: Payer: Self-pay | Admitting: Endocrinology

## 2015-08-03 NOTE — Telephone Encounter (Signed)
Pt is almost out of lantus please assist him with the pt assistance asap he is almost out

## 2015-08-04 NOTE — Telephone Encounter (Signed)
Refill form faxed to [employer] on 08/03/15

## 2015-08-13 ENCOUNTER — Telehealth: Payer: Self-pay | Admitting: Endocrinology

## 2015-08-13 NOTE — Telephone Encounter (Signed)
Error

## 2015-08-25 ENCOUNTER — Ambulatory Visit: Payer: Medicare Other | Admitting: Endocrinology

## 2015-09-09 ENCOUNTER — Ambulatory Visit (INDEPENDENT_AMBULATORY_CARE_PROVIDER_SITE_OTHER): Payer: Medicare Other | Admitting: Endocrinology

## 2015-09-09 ENCOUNTER — Encounter: Payer: Self-pay | Admitting: Endocrinology

## 2015-09-09 VITALS — BP 120/82 | HR 85 | Temp 98.2°F | Resp 16 | Ht 73.5 in | Wt 337.8 lb

## 2015-09-09 DIAGNOSIS — E1165 Type 2 diabetes mellitus with hyperglycemia: Secondary | ICD-10-CM | POA: Diagnosis not present

## 2015-09-09 DIAGNOSIS — E669 Obesity, unspecified: Secondary | ICD-10-CM

## 2015-09-09 DIAGNOSIS — Z794 Long term (current) use of insulin: Secondary | ICD-10-CM | POA: Diagnosis not present

## 2015-09-09 DIAGNOSIS — I10 Essential (primary) hypertension: Secondary | ICD-10-CM

## 2015-09-09 DIAGNOSIS — IMO0001 Reserved for inherently not codable concepts without codable children: Secondary | ICD-10-CM

## 2015-09-09 LAB — POCT GLYCOSYLATED HEMOGLOBIN (HGB A1C): Hemoglobin A1C: 7.2

## 2015-09-09 NOTE — Progress Notes (Signed)
Patient ID: Edward Clarke, male   DOB: [birth year], 50 y.o.   MRN: [id number]   Reason for Appointment: Diabetes follow-up   History of Present Illness    Diagnosis: Type 2 diabetes mellitus, date of diagnosis: 2005.   He has been on basal bolus insulin regimen in the last few years along with metformin Most of the difficulty with control is related to his noncompliance with diet, medications, glucose monitoring and financial issues Frequently will had been running out of one or the other medication or insulin  and blood sugars would not be well controlled  Recent history:   INSULIN: Lantus 20-35 units bid; regular insulin prn  His blood sugar control has been dependent on his getting his insulin supplies consistently He has been able to get Lantus through the patient assistance program although he was out of this for about 3 days His A1c is surprisingly better than usual today at 7.2, usually 7.6 % or more  Current blood sugar patterns and problems:  He is checking blood sugars mostly when he wakes up and sporadically later in the day, mostly in the evenings  His monitor does not allow him to marked the postprandial readings and not clear which readings are after eating  He consistently does not understand how to adjust basal and bolus insulin regimens and is using arbitrary doses.  He will adjust his Lantus in the morning based on his fasting reading and will take anywhere between 20-35 units  He thinks that if he takes 35 units for normal readings in the morning he will get hypoglycemic.  Usually not adjusting evening Lantus  He does not understand the need to take REGULAR insulin before eating and he will take it only if the blood sugar is high which he is doing randomly in the evenings  He has gained weight  However is still not motivated to exercise  He takes his metformin regularly and this may be helping his control   Proper timing of medications in relation  to meals: Yes   Monitors blood glucose: About 1-2 times a day Glucometer: One Touch Verio.  Blood Glucose readings by download:    Mean values apply above for all meters except median for One Touch  PRE-MEAL Fasting Lunch Dinner  8- 11:30 PM  Overall  Glucose range:  80-220    146   113-290    Mean/median:  107     176     Meals: Usually 2 meals a day ( lunch 1 PM, supper 9 pm)     Carbohydrate intake: Variable  Food preferences: variable eating out at [place] sometimes but not controlling portions   Physical activity: exercise: not now, was on Recumbent bike previously his wife says that he is too busy playing video games   Wt Readings from Last 3 Encounters:  09/09/15 337 lb 12.8 oz (153.225 kg)  06/26/15 331 lb 3.2 oz (150.231 kg)  05/13/15 337 lb (152.862 kg)     The HbgA1c has been as high as 11.6 previously .  Lab Results  Component Value Date   HGBA1C 7.2 09/09/2015   HGBA1C 8.0 05/13/2015   HGBA1C 7.9* 01/22/2015   Lab Results  Component Value Date   MICROALBUR <0.7 06/23/2015   LDLCALC 144* 06/23/2015   CREATININE 1.33 06/23/2015        Medication List       This list is accurate as of: 09/09/15  8:23 PM.  Always use your most recent  med list.               aspirin EC 81 MG tablet  Take 81 mg by mouth every morning.     glucose blood test strip  Commonly known as:  ONETOUCH VERIO  Use as instructed to check blood sugar 4 times per day dx code E11.65     ibuprofen 200 MG tablet  Commonly known as:  ADVIL,MOTRIN  Take 400-800 mg by mouth every 8 (eight) hours as needed for headache.     Insulin Glargine 100 UNIT/ML Solostar Pen  Commonly known as:  LANTUS  Inject 35 Units into the skin 2 (two) times daily.     insulin regular 100 units/mL injection  Commonly known as:  NOVOLIN R RELION  Inject 20 units two times a day with meals     lisinopril 10 MG tablet  Commonly known as:  PRINIVIL,ZESTRIL  TAKE ONE TABLET BY MOUTH ONCE DAILY      metFORMIN 1000 MG tablet  Commonly known as:  GLUCOPHAGE  TAKE ONE TABLET BY MOUTH TWICE DAILY WITH MEALS     sildenafil 50 MG tablet  Commonly known as:  VIAGRA  Take 1 tablet (50 mg total) by mouth daily as needed for erectile dysfunction.     simvastatin 40 MG tablet  Commonly known as:  ZOCOR  Take one tablet daily     tobramycin 0.3 % ophthalmic solution  Commonly known as:  TOBREX  Place 1 drop into both eyes See admin instructions. He uses for 2 days prior to eye procedures.        Allergies:  Allergies  Allergen Reactions  . Benazepril Other (See Comments)    Reaction unknown - may have been headaches    Past Medical History  Diagnosis Date  . Monoclonal paraproteinemia   . Diabetes mellitus   . Hypertension   . Obesity   . Hyperlipidemia     Past Surgical History  Procedure Laterality Date  . Appendectomy    . Eye surgery    . Tonsillectomy      Family History  Problem Relation Age of Onset  . Diabetes Sister     Social History:  reports that he has quit smoking. His smoking use included Cigarettes. He does not have any smokeless tobacco history on file. He reports that he drinks about 1.2 oz of alcohol per week. He reports that he uses illicit drugs (Marijuana) about 7 times per week.  Review of Systems:   HYPERTENSION:  has had long-standing hypertension and blood pressure is better with restarting lisinopril  HYPERLIPIDEMIA:  The lipid abnormality consists of elevated LDL treated with simvastatin.  He does not take his medication consistently and does not refill it when it runs out  Lab Results  Component Value Date   CHOL 215* 06/23/2015   HDL 34.50* 06/23/2015   LDLCALC 144* 06/23/2015   LDLDIRECT 118.0 11/13/2014   TRIG 184.0* 06/23/2015   CHOLHDL 6 06/23/2015       Examination:   BP 120/82 mmHg  Pulse 85  Temp(Src) 98.2 F (36.8 C)  Resp 16  Ht 6' 1.5" (1.867 m)  Wt 337 lb 12.8 oz (153.225 kg)  BMI 43.96 kg/m2  SpO2 98%   Body mass index is 43.96 kg/(m^2).    Assesment/Plan:   1. Diabetes type 2, uncontrolled  See history of present illness for current blood sugar patterns, management  and problems identified  His blood sugars are somewhat better with adding  metformin and also at least correcting his high readings with Regular Insulin However his blood sugars are as high as 290 when he does not take Regular Insulin proactively before meals His diet has been very inconsistent recently and is trying to limit carbohydrates and more recently but still has not lost weight Does not exercise  Despite consistent instructions on how to adjust Lantus insulin and what this does he is still arbitrarily adjusting the morning dose based on fasting reading Not taking mealtime dose before eating based on his meal and taking only occasionally at times when blood sugars are high; also did not follow-up with nurse educator as directed  Recommendations made today:   Start taking regular insulin as directed, most likely will need 15-20 units based on carbohydrate intake  Given him a low chart to adjust the Lantus insulin starting with 30 units twice a day and he will have his wife adjust this the dose based on every 3 day blood sugar patterns  Balanced meals with some protein and carbohydrate, given a booklet on carbohydrate counting  Check blood sugar at least once either after lunch or supper and sometimes before supper  2. HYPERTENSION: Currently  blood pressure is better Continue lisinopril  3. Hyperlipidemia: He will need to have lipids rechecked on the next visit    Patient Instructions  Take 30 Lantus twice daily for 3 days straight then adjust  Reg insulin for Carbs and hi fat meals  Check blood sugars on waking up 3-4  times a week Also check blood sugars about 2 hours after a meal and do this after different meals by rotation  Recommended blood sugar levels on waking up is 90-130 and about 2 hours  after meal is 130-160  Please bring your blood sugar monitor to each visit, thank you    Counseling time on subjects discussed above is over 50% of today's 25 minute visit  Edward Clarke 09/09/2015, 8:23 PM   Note: This office note was prepared with Dragon voice recognition system technology. Any transcriptional errors that result from this process are unintentional.

## 2015-09-09 NOTE — Patient Instructions (Signed)
Take 30 Lantus twice daily for 3 days straight then adjust  Reg insulin for Carbs and hi fat meals  Check blood sugars on waking up 3-4  times a week Also check blood sugars about 2 hours after a meal and do this after different meals by rotation  Recommended blood sugar levels on waking up is 90-130 and about 2 hours after meal is 130-160  Please bring your blood sugar monitor to each visit, thank you

## 2015-09-23 ENCOUNTER — Encounter: Payer: Medicare Other | Attending: Endocrinology | Admitting: Nutrition

## 2015-09-23 ENCOUNTER — Telehealth: Payer: Self-pay | Admitting: Nutrition

## 2015-09-23 DIAGNOSIS — E1165 Type 2 diabetes mellitus with hyperglycemia: Secondary | ICD-10-CM

## 2015-09-23 NOTE — Telephone Encounter (Signed)
Ok--same dose. 

## 2015-09-23 NOTE — Telephone Encounter (Signed)
Patient is on R insulin before meals, and is not waiting 30 min. After injecting his insulin. He is getting his Lantus from [facility], and was wondering if we can order him some Apidra as well

## 2015-09-30 NOTE — Telephone Encounter (Signed)
Suanne Marker, can you order this for him?

## 2015-09-30 NOTE — Patient Instructions (Signed)
1.  Test blood sugar for me before breakfast-(2PM), and test acS, and test HS 2.  Call me results in 1 week 3.  Start exercising on stationary bike for 10-15 min. 2X/day and work up to 68min. Twice a day.

## 2015-09-30 NOTE — Progress Notes (Signed)
Patient is here with his wife, and did not bring his meter.   SBGM: Says is testing 1-2 times/day--FBSs 80-120, acS: 160-170s.  Exercise: none  Plays video games all day. Insulin dose:  Lantus 30u q AM, and PM, Humulin R-20u 5-10 min before meals.   Typical day: 8-10 get up, coffee with creamer  Takes Lantus  2-3PM; takes Humalin R and eats:  2 sandwiches, Bologna and cheese with mayo and mustard, no chips, water to drink 5PM: fruit, or chips , or few cookies, or sandwich--no insulin 6-7Supper, Pasta with Veg.  Or 2 sandwiches (PB&J) 10PM: snack: cookies, or sandwich, or chips--no insulin Plan:  1.  Test blood sugar for me before breakfast-(2PM), and test acS, and test HS 2.  Call me results in 1 weeks.  Verio test strips given for this.   3.  Discussed the importance of exercise, to prevent stroke, and HA.  He agreed and agreed to start at 15 min. On his stationary bike 2X/day and work up to 44min. BID.

## 2015-10-01 NOTE — Telephone Encounter (Signed)
His apidra came in today from patient assistance, I left him a message to come pick it up

## 2015-10-16 ENCOUNTER — Other Ambulatory Visit (INDEPENDENT_AMBULATORY_CARE_PROVIDER_SITE_OTHER): Payer: Medicare Other

## 2015-10-16 DIAGNOSIS — E1165 Type 2 diabetes mellitus with hyperglycemia: Secondary | ICD-10-CM | POA: Diagnosis not present

## 2015-10-16 DIAGNOSIS — Z794 Long term (current) use of insulin: Secondary | ICD-10-CM

## 2015-10-16 DIAGNOSIS — E785 Hyperlipidemia, unspecified: Secondary | ICD-10-CM | POA: Diagnosis not present

## 2015-10-16 LAB — BASIC METABOLIC PANEL
BUN: 15 mg/dL (ref 6–23)
CO2: 27 meq/L (ref 19–32)
CREATININE: 1.34 mg/dL (ref 0.40–1.50)
Calcium: 10 mg/dL (ref 8.4–10.5)
Chloride: 101 mEq/L (ref 96–112)
GFR: 72.29 mL/min (ref >60.00)
Glucose, Bld: 122 mg/dL — ABNORMAL HIGH (ref 70–99)
Potassium: 4.8 mEq/L (ref 3.5–5.1)
Sodium: 137 mEq/L (ref 135–145)

## 2015-10-16 LAB — LIPID PANEL
Cholesterol: 228 mg/dL — ABNORMAL HIGH (ref 0–200)
HDL: 37.6 mg/dL — ABNORMAL LOW (ref >39.00)
NONHDL: 190.35
TRIGLYCERIDES: 377 mg/dL — AB (ref 0.0–149.0)
Total CHOL/HDL Ratio: 6
VLDL: 75.4 mg/dL — ABNORMAL HIGH (ref 0.0–40.0)

## 2015-10-17 LAB — LDL CHOLESTEROL, DIRECT: Direct LDL: 147 mg/dL

## 2015-10-21 ENCOUNTER — Encounter: Payer: Self-pay | Admitting: Endocrinology

## 2015-10-21 ENCOUNTER — Ambulatory Visit (INDEPENDENT_AMBULATORY_CARE_PROVIDER_SITE_OTHER): Payer: Medicare Other | Admitting: Endocrinology

## 2015-10-21 VITALS — BP 110/82 | HR 85 | Temp 98.0°F | Resp 16 | Ht 73.5 in | Wt 338.4 lb

## 2015-10-21 DIAGNOSIS — E1165 Type 2 diabetes mellitus with hyperglycemia: Secondary | ICD-10-CM | POA: Diagnosis not present

## 2015-10-21 DIAGNOSIS — E785 Hyperlipidemia, unspecified: Secondary | ICD-10-CM

## 2015-10-21 DIAGNOSIS — Z794 Long term (current) use of insulin: Secondary | ICD-10-CM | POA: Diagnosis not present

## 2015-10-21 NOTE — Progress Notes (Signed)
Patient ID: Edward Clarke, male   DOB: 1966/05/12, 50 y.o.   MRN: [id number]   Reason for Appointment: Diabetes follow-up   History of Present Illness    Diagnosis: Type 2 diabetes mellitus, date of diagnosis: 2005.   He has been on basal bolus insulin regimen in the last few years along with metformin Most of the difficulty with control is related to his noncompliance with diet, medications, glucose monitoring and financial issues Frequently will had been running out of one or the other medication or insulin  and blood sugars would not be well controlled  Recent history:   INSULIN: Lantus units 30  bid; Apidra insulin 20 units before meals  His blood sugar control has been dependent on his getting his insulin supplies consistently He has been able to get Lantus through the patient assistance program and now is able to get Apidra also However again has not had consistent supplies of Apidra Last A1c was 7.2  Current blood sugar patterns and problems:  He is checking blood sugars some after meals as directed but not clear how often  He did not bring his monitor for download today  He is not adjusting his Apidra based on how much he is eating and is generally taking about the same amount  Not clear what his level of control is overall recently, no labs available for this  Although he is trying to start some exercise with a recumbent bike he has not lost any weight  He takes his metformin regularly and this may be helping his control   He has been periodically improving his diet and cutting back on meats, getting more rice and beans  Proper timing of medications in relation to meals: Yes   Monitors blood glucose: About 1-2 times a day Glucometer: One Touch Verio.  Blood Glucose readings by recall: Fasting recently as low as 120, higher when he ran out of Lantus After meals generally about 160, occasionally 180 HYPOGLYCEMIA: He had 1 episode at 3 AM with eating  lighter meal the night before   Meals: Usually 2 meals a day ( lunch 1 PM, supper 9 pm)     Carbohydrate intake: Variable  Food preferences: variable eating out at [facility] sometimes   Physical activity: exercise:  Recumbent bike   Wt Readings from Last 3 Encounters:  10/21/15 338 lb 6.4 oz (153.497 kg)  09/09/15 337 lb 12.8 oz (153.225 kg)  06/26/15 331 lb 3.2 oz (150.231 kg)     The HbgA1c has been as high as 11.6 previously .  Lab Results  Component Value Date   HGBA1C 7.2 09/09/2015   HGBA1C 8.0 05/13/2015   HGBA1C 7.9* 01/22/2015   Lab Results  Component Value Date   MICROALBUR <0.7 06/23/2015   LDLCALC 144* 06/23/2015   CREATININE 1.34 10/16/2015        Medication List       This list is accurate as of: 10/21/15  8:20 PM.  Always use your most recent med list.               APIDRA SOLOSTAR 100 UNIT/ML Solostar Pen  Generic drug:  Insulin Glulisine  Inject 20 Units into the skin 2 (two) times daily.     aspirin EC 81 MG tablet  Take 81 mg by mouth every morning.     glucose blood test strip  Commonly known as:  ONETOUCH VERIO  Use as instructed to check blood sugar 4 times per day dx  code E11.65     ibuprofen 200 MG tablet  Commonly known as:  ADVIL,MOTRIN  Take 400-800 mg by mouth every 8 (eight) hours as needed for headache.     Insulin Glargine 100 UNIT/ML Solostar Pen  Commonly known as:  LANTUS  Inject 30 Units into the skin 2 (two) times daily.     lisinopril 10 MG tablet  Commonly known as:  PRINIVIL,ZESTRIL  TAKE ONE TABLET BY MOUTH ONCE DAILY     metFORMIN 1000 MG tablet  Commonly known as:  GLUCOPHAGE  TAKE ONE TABLET BY MOUTH TWICE DAILY WITH MEALS     sildenafil 50 MG tablet  Commonly known as:  VIAGRA  Take 1 tablet (50 mg total) by mouth daily as needed for erectile dysfunction.     simvastatin 40 MG tablet  Commonly known as:  ZOCOR  Take one tablet daily     tobramycin 0.3 % ophthalmic solution  Commonly known as:   TOBREX  Place 1 drop into both eyes See admin instructions. He uses for 2 days prior to eye procedures.        Allergies:  Allergies  Allergen Reactions  . Benazepril Other (See Comments)    Reaction unknown - may have been headaches    Past Medical History  Diagnosis Date  . Monoclonal paraproteinemia   . Diabetes mellitus   . Hypertension   . Obesity   . Hyperlipidemia     Past Surgical History  Procedure Laterality Date  . Appendectomy    . Eye surgery    . Tonsillectomy      Family History  Problem Relation Age of Onset  . Diabetes Sister     Social History:  reports that he has quit smoking. His smoking use included Cigarettes. He does not have any smokeless tobacco history on file. He reports that he drinks about 1.2 oz of alcohol per week. He reports that he uses illicit drugs (Marijuana) about 7 times per week.  Review of Systems:   HYPERTENSION:  has had long-standing hypertension and blood pressure is better with restarting lisinopril  HYPERLIPIDEMIA:  The lipid abnormality consists of elevated LDL treated with simvastatin.  He does not take his medication consistently and does not refill it when it runs out He says he cannot afford the co-pay  Lab Results  Component Value Date   CHOL 228* 10/16/2015   HDL 37.60* 10/16/2015   LDLCALC 144* 06/23/2015   LDLDIRECT 147.0 10/16/2015   TRIG 377.0* 10/16/2015   CHOLHDL 6 10/16/2015       Examination:   BP 110/82 mmHg  Pulse 85  Temp(Src) 98 F (36.7 C)  Resp 16  Ht 6' 1.5" (1.867 m)  Wt 338 lb 6.4 oz (153.497 kg)  BMI 44.04 kg/m2  SpO2 95%  Body mass index is 44.04 kg/(m^2).    Assesment/Plan:   1. Diabetes type 2, uncontrolled  See history of present illness for current blood sugar patterns, management  and problems identified He thinks his blood sugars are better although he did not bring his monitor for download He has been somewhat better with diet although not consistent Also trying  a little exercise However has not lost any weight He thinks his postprandial readings are also good but not clear how often is monitoring them Since his fasting readings are reportedly better recently will continue the same dose of Lantus Continue take Apidra at mealtimes and discussed blood sugar targets  Also discussed treatments for hypoglycemia but he  says he cannot buy glucose tablets Encouraged him to exercise more A1c on the next visit  2. HYPERTENSION:  Continue lisinopril  3. Hyperlipidemia: He will last the pharmacist for cheaper alternatives to simvastatin and call us back  He will need to have lipids rechecked on the next visit    Patient Instructions  Check cost of Lipitor and Crestor  For cholesterol  Check blood sugars on waking up 3  times a week Also check blood sugars about 2 hours after a meal and do this after different meals by rotation  Recommended blood sugar levels on waking up is 90-130 and about 2 hours after meal is 130-160  Please bring your blood sugar monitor to each visit, thank you    Counseling time on subjects discussed above is over 50% of today's 25 minute visit  Ellery Meroney 10/21/2015, 8:20 PM   Note: This office note was prepared with Dragon voice recognition system technology. Any transcriptional errors that result from this process are unintentional.

## 2015-10-21 NOTE — Patient Instructions (Addendum)
Check cost of Lipitor and Crestor  For cholesterol  Check blood sugars on waking up 3  times a week Also check blood sugars about 2 hours after a meal and do this after different meals by rotation  Recommended blood sugar levels on waking up is 90-130 and about 2 hours after meal is 130-160  Please bring your blood sugar monitor to each visit, thank you

## 2015-11-11 ENCOUNTER — Other Ambulatory Visit: Payer: Self-pay | Admitting: Endocrinology

## 2015-12-03 ENCOUNTER — Telehealth: Payer: Self-pay

## 2015-12-03 NOTE — Telephone Encounter (Signed)
Left message for patient to return call regarding flu shot call/survey

## 2015-12-11 ENCOUNTER — Telehealth: Payer: Self-pay | Admitting: Endocrinology

## 2015-12-11 NOTE — Telephone Encounter (Signed)
Refill form was faxed to [facility].

## 2015-12-11 NOTE — Telephone Encounter (Signed)
Patient need refill of Lantus.  Send to his address.

## 2015-12-21 ENCOUNTER — Other Ambulatory Visit: Payer: Medicare Other

## 2015-12-24 ENCOUNTER — Ambulatory Visit: Payer: Medicare Other | Admitting: Endocrinology

## 2016-01-07 ENCOUNTER — Other Ambulatory Visit (INDEPENDENT_AMBULATORY_CARE_PROVIDER_SITE_OTHER): Payer: Medicare Other

## 2016-01-07 DIAGNOSIS — E1165 Type 2 diabetes mellitus with hyperglycemia: Secondary | ICD-10-CM

## 2016-01-07 DIAGNOSIS — E785 Hyperlipidemia, unspecified: Secondary | ICD-10-CM | POA: Diagnosis not present

## 2016-01-07 DIAGNOSIS — Z794 Long term (current) use of insulin: Secondary | ICD-10-CM

## 2016-01-07 LAB — LIPID PANEL
CHOLESTEROL: 238 mg/dL — AB (ref 0–200)
HDL: 42.3 mg/dL (ref >39.00)
NonHDL: 195.56
Total CHOL/HDL Ratio: 6
Triglycerides: 221 mg/dL — ABNORMAL HIGH (ref 0.0–149.0)
VLDL: 44.2 mg/dL — AB (ref 0.0–40.0)

## 2016-01-07 LAB — BASIC METABOLIC PANEL
BUN: 19 mg/dL (ref 6–23)
CO2: 26 mEq/L (ref 19–32)
Calcium: 9.8 mg/dL (ref 8.4–10.5)
Chloride: 102 mEq/L (ref 96–112)
Creatinine, Ser: 1.4 mg/dL (ref 0.40–1.50)
GFR: 68.67 mL/min (ref >60.00)
Glucose, Bld: 121 mg/dL — ABNORMAL HIGH (ref 70–99)
POTASSIUM: 4.2 meq/L (ref 3.5–5.1)
Sodium: 138 mEq/L (ref 135–145)

## 2016-01-07 LAB — LDL CHOLESTEROL, DIRECT: Direct LDL: 160 mg/dL

## 2016-01-07 LAB — HEMOGLOBIN A1C: Hgb A1c MFr Bld: 7.2 % — ABNORMAL HIGH (ref 4.6–6.5)

## 2016-01-11 ENCOUNTER — Ambulatory Visit (INDEPENDENT_AMBULATORY_CARE_PROVIDER_SITE_OTHER): Payer: Medicare Other | Admitting: Endocrinology

## 2016-01-11 ENCOUNTER — Encounter: Payer: Self-pay | Admitting: Endocrinology

## 2016-01-11 ENCOUNTER — Telehealth: Payer: Self-pay | Admitting: Endocrinology

## 2016-01-11 VITALS — BP 134/82 | HR 82 | Temp 97.9°F | Ht 73.5 in | Wt 344.0 lb

## 2016-01-11 DIAGNOSIS — E1165 Type 2 diabetes mellitus with hyperglycemia: Secondary | ICD-10-CM

## 2016-01-11 DIAGNOSIS — Z794 Long term (current) use of insulin: Secondary | ICD-10-CM

## 2016-01-11 MED ORDER — PRAVASTATIN SODIUM 40 MG PO TABS: 40.0000 mg | ORAL_TABLET | Freq: Every day | ORAL | Status: DC

## 2016-01-11 MED ORDER — CANAGLIFLOZIN-METFORMIN HCL ER 50-1000 MG PO TB24: 2.0000 | ORAL_TABLET | Freq: Every day | ORAL | Status: DC

## 2016-01-11 NOTE — Telephone Encounter (Signed)
Prescription sent but he needs to let us know if he starts taking it for instructions on his medications and insulin and follow-up

## 2016-01-11 NOTE — Telephone Encounter (Signed)
Please read and advise

## 2016-01-11 NOTE — Telephone Encounter (Signed)
rx has to be called into [facility] on [address] for the invokamet for him to see how much it is

## 2016-01-11 NOTE — Progress Notes (Signed)
Patient ID: Edward Clarke, male   DOB: Oct 26, 1965, 50 y.o.   MRN: [id number]   Reason for Appointment: Diabetes follow-up   History of Present Illness    Diagnosis: Type 2 diabetes mellitus, date of diagnosis: 2005.   He has been on basal bolus insulin regimen in the last few years along with metformin Most of the difficulty with control is related to his noncompliance with diet, medications, glucose monitoring and financial issues Frequently will had been running out of one or the other medication or insulin  and blood sugars would not be well controlled  Recent history:   INSULIN: Lantus units 30  bid; Apidra insulin 20 units before meals  His blood sugar control has been dependent on his getting his insulin supplies consistently He has been able to get Lantus and Apidra through the patient assistance program  However again has not had consistent supplies of Lantus, blood sugars were higher last month because of this Last A1c was 7.2, unchanged  Current management, blood sugar patterns and problems:  He did have high readings both 3 morning and evening last month when he was out of Lantus  He now says that he will occasionally feel hungry during the day but does not check his blood sugar before having a snack  Has had symptomatic low blood sugar once at 5:30 AM otherwise usually none.  More recently he has check blood sugars primarily fasting and these are excellent usually  He has a scattered couple of readings before lunch and supper which are also near normal  He thinks that he is adjusting his daytime Apidra based on his carbohydrate intake and will take 10 units if eating just a small bowl of cereal.  Usually for full meal he will take 20 units  Proper timing of medications in relation to meals: Yes   Monitors blood glucose: About 1-2 times a day Glucometer: One Touch Verio.  Blood Glucose readings by download for the last 4 weeks:  Mean values apply above  for all meters except median for One Touch  PRE-MEAL Fasting Lunch Dinner Bedtime Overall  Glucose range: 65-192   108, 190  135-236    Mean/median:     142   HYPOGLYCEMIA: He had 1 episode at 5 AM, symptoms are: dark spots, dizzy feeling. Treatment of hypoglycemia: Usually coffee creamer or food.  Does have glucose tablets at home but does not use them  Meals: Usually 2 meals a day ( lunch 1 PM, supper 9 pm)     Carbohydrate intake: Variable   Physical activity: exercise:  Recumbent bike, some walking, less recently   Wt Readings from Last 3 Encounters:  01/11/16 344 lb (156.037 kg)  10/21/15 338 lb 6.4 oz (153.497 kg)  09/09/15 337 lb 12.8 oz (153.225 kg)     The HbgA1c has been as high as 11.6 previously .  Lab Results  Component Value Date   HGBA1C 7.2* 01/07/2016   HGBA1C 7.2 09/09/2015   HGBA1C 8.0 05/13/2015   Lab Results  Component Value Date   MICROALBUR <0.7 06/23/2015   LDLCALC 144* 06/23/2015   CREATININE 1.40 01/07/2016        Medication List       This list is accurate as of: 01/11/16  5:02 PM.  Always use your most recent med list.               APIDRA SOLOSTAR 100 UNIT/ML Solostar Pen  Generic drug:  Insulin Glulisine  Inject 20 Units into the skin 2 (two) times daily.     aspirin EC 81 MG tablet  Take 81 mg by mouth every morning.     glucose blood test strip  Commonly known as:  ONETOUCH VERIO  Use as instructed to check blood sugar 4 times per day dx code E11.65     ibuprofen 200 MG tablet  Commonly known as:  ADVIL,MOTRIN  Take 400-800 mg by mouth every 8 (eight) hours as needed for headache.     Insulin Glargine 100 UNIT/ML Solostar Pen  Commonly known as:  LANTUS  Inject 30 Units into the skin 2 (two) times daily.     lisinopril 10 MG tablet  Commonly known as:  PRINIVIL,ZESTRIL  TAKE ONE TABLET BY MOUTH ONCE DAILY     metFORMIN 1000 MG tablet  Commonly known as:  GLUCOPHAGE  TAKE ONE TABLET BY MOUTH TWICE DAILY WITH MEALS       pravastatin 40 MG tablet  Commonly known as:  PRAVACHOL  Take 1 tablet (40 mg total) by mouth daily.     tobramycin 0.3 % ophthalmic solution  Commonly known as:  TOBREX  Place 1 drop into both eyes See admin instructions. Reported on 01/11/2016        Allergies:  Allergies  Allergen Reactions  . Benazepril Other (See Comments)    Reaction unknown - may have been headaches    Past Medical History  Diagnosis Date  . Monoclonal paraproteinemia   . Diabetes mellitus   . Hypertension   . Obesity   . Hyperlipidemia     Past Surgical History  Procedure Laterality Date  . Appendectomy    . Eye surgery    . Tonsillectomy      Family History  Problem Relation Age of Onset  . Diabetes Sister     Social History:  reports that he has quit smoking. His smoking use included Cigarettes. He does not have any smokeless tobacco history on file. He reports that he drinks about 1.2 oz of alcohol per week. He reports that he uses illicit drugs (Marijuana) about 7 times per week.  Review of Systems:   HYPERTENSION:  has had long-standing hypertension and blood pressure is Controlled with lisinopril alone  HYPERLIPIDEMIA:  The lipid abnormality consists of elevated LDL treated previously with simvastatin.  He does not take his medication He says he cannot afford the co-pay  Lab Results  Component Value Date   CHOL 238* 01/07/2016   HDL 42.30 01/07/2016   LDLCALC 144* 06/23/2015   LDLDIRECT 160.0 01/07/2016   TRIG 221.0* 01/07/2016   CHOLHDL 6 01/07/2016    He is due to get his eye exam next month     Examination:   BP 134/82 mmHg  Pulse 82  Temp(Src) 97.9 F (36.6 C) (Oral)  Ht 6' 1.5" (1.867 m)  Wt 344 lb (156.037 kg)  BMI 44.77 kg/m2  SpO2 96%  Body mass index is 44.77 kg/(m^2).    Assesment/Plan:   1. Diabetes type 2, uncontrolled with obesity  See history of present illness for current blood sugar patterns, management  and problems identified His A1c  is reasonably good at 7.2 and stable He does have periodic high readings when he is not able to get his insulin consistently  Currently difficult to know if he has any postprandial hyperglycemia as he is checking blood sugars primarily fasting Also has gained weight; has difficulty with compliance with consistent exercise and diet May also have  some tendency to low blood sugars at breakfast and lunchtime from his Lantus regimen of 30 units twice a day  Encouraged him to exercise more consistently He will see the dietitian for formal meal planning Discussed options for treatment for hypoglycemia instead of food or coffee creamer  Medication changes: He will reduce Lantus to 28 units twice a day He will at least try to check his blood sugars at bedtime to help adjust the Apidra Given information on Invokamet and he will check on the insurance coverage for this  2. HYPERTENSION:  Continue lisinopril  3. Hyperlipidemia: He will try to get pravastatin as he cannot pay for the cost of Zocor and cholesterol is consistently high    Patient Instructions  Lantus 28 twice daily     Counseling time on subjects discussed above is over 50% of today's 25 minute visit  Glendi Mohiuddin 01/11/2016, 5:02 PM   Note: This office note was prepared with Dragon voice recognition system technology. Any transcriptional errors that result from this process are unintentional.

## 2016-01-11 NOTE — Patient Instructions (Signed)
Lantus 28 twice daily

## 2016-01-12 NOTE — Telephone Encounter (Signed)
Patient was unable to get the invokamet.  He does not have prescription drug coverage and it was going to cost over $400.  I am mailing him a patient assistance application.

## 2016-01-15 ENCOUNTER — Telehealth: Payer: Self-pay | Admitting: Endocrinology

## 2016-01-15 NOTE — Telephone Encounter (Signed)
I called and spoke with him and answered his questions.

## 2016-01-15 NOTE — Telephone Encounter (Signed)
PT has questions about the forms for PT assistance that he received in the mail.

## 2016-02-05 ENCOUNTER — Other Ambulatory Visit: Payer: Self-pay | Admitting: *Deleted

## 2016-02-05 MED ORDER — CANAGLIFLOZIN-METFORMIN HCL ER 50-1000 MG PO TB24: 2.0000 | ORAL_TABLET | Freq: Every day | ORAL | Status: DC

## 2016-02-18 ENCOUNTER — Encounter: Payer: Medicare Other | Attending: Endocrinology | Admitting: Dietician

## 2016-02-18 ENCOUNTER — Encounter: Payer: Self-pay | Admitting: Dietician

## 2016-02-18 VITALS — Ht 73.5 in | Wt 338.0 lb

## 2016-02-18 DIAGNOSIS — E1165 Type 2 diabetes mellitus with hyperglycemia: Secondary | ICD-10-CM | POA: Insufficient documentation

## 2016-02-18 DIAGNOSIS — Z794 Long term (current) use of insulin: Secondary | ICD-10-CM

## 2016-02-18 DIAGNOSIS — E118 Type 2 diabetes mellitus with unspecified complications: Secondary | ICD-10-CM

## 2016-02-18 NOTE — Progress Notes (Signed)
Medical Nutrition Therapy:  Appt start time: 1330 end time:  1500. Late due to patient arriving at wrong location.  Assessment:  Primary concerns today: Patient is here today with his wife.  He states that he knows what to do but does not have the habits and wants increased motivation.  Hx includes type 2 Diabetes since 2005, Hyperlipidemia, and HTN. He had a hx of lymphoma 2 years ago and has been on disability since.  He also has increased problems with his vision.   His last HgbA1C was 7.2%, cholesterol 238, triglycerides 221, HDL 42 (01/07/2016).  He checks his blood sugar consistently each a (88-150) but generally not other ties of the day.  He is not taking any of his medications other than the insulin at this time.  He states that his wife is getting ready to go back to school and they are saving money wherever they can.  Weight hx: 310 lbs prior to chemo 2 years ago 270's during chemo 338 lbs highest weight (today) 240 lbs lowest weight in 20's  Patient lives with his wife.  She does the shopping and the cooking.  He is on disability and plays video games most of the day.  He lacks motivation for exercise and gets very little.  He has no meal schedule and skips breakfast as he has no taste for food in the morning (most likely due to increased intake at night).  His wife is Vegan which influences his overall diet.  Preferred Learning Style:   Auditory  Visual  Hands on  No preference indicated   Learning Readiness:   Not ready  Contemplating  Ready  Change in progress  MEDICATIONS: He is only taking Insulin Glulisine 20 units with dinner if he is eating "enough carbohydrates" and Lantus 28 units bid.  He has been prescribed Invokamet and has not started this yet and is not taking the Metformin.  Message given to [facility] to check his financial coverage for the Invokamet.   DIETARY INTAKE: Dental problem make it hard to tolerate fresh fruits and vegetables. Usual eating  pattern includes 1-2 meals and 1-2 snacks per day.  Avoided foods include soda.    24-hr recall:  B ( AM): coffee with sugar free creamer or occasional "breakfast cookies" or muffin "eating breakfast makes me eat less throughout the day" Snk ( AM): none  L (2-3 PM): leftovers, fruit OR peanut butter sandwich, fruit Snk (PM):  none D (9 PM): pasta, rice and beans, vegetables Snk ( PM): peanut butter and jelly sandwich, pasta,  Beverages: water, drinks sugar free creamer for low blood sugar,   Usual physical activity:  recumbent  bike 2 times per week 5-15 minutes  Estimated energy needs: 2200 calories 248 g carbohydrates 138 g protein 73 g fat  Progress Towards Goal(s):  In progress.   Nutritional Diagnosis:  NB-1.1 Food and nutrition-related knowledge deficit As related to balance of carbohydrate, protein, and fat.  As evidenced by diet hx.    Intervention:  Nutrition counseling/education to increase motivation for increased self care (exercise, regular meal schedule).   Educated on carbohydrates and portion sizes, importance of regular exercise and regularly scheduled meals as well as medication compliance.    Get a routine.  Meals, medicine, etc. What motivates you for exercise?  Be as active as possible most days of the week.  Start slowly and increase gradually.  Start with 5 minutes 3 times per week and increase to 10 minutes the following  week etc.   Consider adding breakfast Aim for 4 Carb Choices per meal (60 grams) +/- 1 either way  Aim for 0-2 Carbs per snack if hungry  Include protein in moderation with your meals and snacks Consider reading food labels for Total Carbohydrate and Fat Grams of foods Consider checking BG at alternate times per day as directed by MD  Consider taking medication as directed by MD  Teaching Method Utilized:  Visual Auditory Hands on  Handouts given during visit include: Living Well with Diabetes Carb Counting and Food Label  handouts Meal Plan Card Label reading  Snack list  Barriers to learning/adherence to lifestyle change: lack of motivation  Demonstrated degree of understanding via:  Teach Back   Monitoring/Evaluation:  Dietary intake, exercise, label reading, and body weight prn.

## 2016-02-18 NOTE — Patient Instructions (Addendum)
Get a routine.  Meals, medicine, etc. What motivates you for exercise?  Be as active as possible most days of the week.  Start slowly and increase gradually.  Start with 5 minutes 3 times per week and increase to 10 minutes the following week etc.   Consider adding breakfast Aim for 4 Carb Choices per meal (60 grams) +/- 1 either way  Aim for 0-2 Carbs per snack if hungry  Include protein in moderation with your meals and snacks Consider reading food labels for Total Carbohydrate and Fat Grams of foods Consider checking BG at alternate times per day as directed by MD  Consider taking medication as directed by MD

## 2016-03-01 ENCOUNTER — Telehealth: Payer: Self-pay | Admitting: Endocrinology

## 2016-03-01 MED ORDER — CANAGLIFLOZIN-METFORMIN HCL ER 50-1000 MG PO TB24: 2.0000 | ORAL_TABLET | Freq: Every day | ORAL | Status: DC

## 2016-03-01 NOTE — Telephone Encounter (Signed)
Patient was just approved for a discounted medication called Invokamet, but he needs for the prescription to be sent to his pharmacy [facility] on [address]). Patient also wants to know if the new medication replaces the Metformin.

## 2016-03-01 NOTE — Telephone Encounter (Signed)
I contacted the pt and advised of note and instructions below. Pt voiced understanding, but declined to make an appointment at this time. Pt stated he had to check on his wife's schedule.

## 2016-03-01 NOTE — Telephone Encounter (Signed)
He can get Invokamet XR 50/1000, 2 tablets daily and this was replaced metformin He will need to reduce his insulin doses by about 4 units if blood sugars start coming down. Also needs to reschedule his follow-up for 3 weeks from now since will need to see how this medication is working

## 2016-03-17 MED ORDER — INSULIN GLARGINE 100 UNIT/ML SOLOSTAR PEN: PEN_INJECTOR | SUBCUTANEOUS | Status: DC

## 2016-03-17 NOTE — Telephone Encounter (Signed)
Rx submitted per pt's request.  

## 2016-03-17 NOTE — Telephone Encounter (Signed)
Patient stated that he is ready to order his Insulin Glargine (LANTUS) 100 UNIT/ML Solostar Pen.

## 2016-03-29 ENCOUNTER — Telehealth: Payer: Self-pay | Admitting: Endocrinology

## 2016-03-29 ENCOUNTER — Other Ambulatory Visit: Payer: Self-pay

## 2016-03-29 ENCOUNTER — Telehealth: Payer: Self-pay

## 2016-03-29 NOTE — Telephone Encounter (Signed)
Called patient back to ask about insulin issues. I advised patient that we had sent it to the pharmacy, patient stated he would go pick it up; he was just curious as to where to go pick it up. No other questions or concerns.

## 2016-03-29 NOTE — Telephone Encounter (Signed)
PT is on the Patient Assistance for his Lantus, he stated he is down to two pens now and would like to know the status of where his insulin is. Requests call back.

## 2016-03-29 NOTE — Telephone Encounter (Signed)
Patient states he can not get the medication from pharmacy, I will try and call the manufacture of lantus to get them to send the medication here because patient is on the assistance program and it is more expensive.

## 2016-04-12 ENCOUNTER — Other Ambulatory Visit: Payer: Medicare Other

## 2016-04-13 ENCOUNTER — Other Ambulatory Visit: Payer: Medicare Other

## 2016-04-14 ENCOUNTER — Ambulatory Visit: Payer: Medicare Other | Admitting: Endocrinology

## 2016-05-12 ENCOUNTER — Other Ambulatory Visit: Payer: Medicare Other

## 2016-05-16 ENCOUNTER — Ambulatory Visit: Payer: Medicare Other | Admitting: Endocrinology

## 2016-05-27 ENCOUNTER — Other Ambulatory Visit (INDEPENDENT_AMBULATORY_CARE_PROVIDER_SITE_OTHER): Payer: Medicare Other

## 2016-05-27 DIAGNOSIS — E1165 Type 2 diabetes mellitus with hyperglycemia: Secondary | ICD-10-CM

## 2016-05-27 DIAGNOSIS — Z794 Long term (current) use of insulin: Secondary | ICD-10-CM | POA: Diagnosis not present

## 2016-05-27 LAB — COMPREHENSIVE METABOLIC PANEL
ALBUMIN: 4.2 g/dL (ref 3.5–5.2)
ALT: 15 U/L (ref 0–53)
AST: 15 U/L (ref 0–37)
Alkaline Phosphatase: 73 U/L (ref 39–117)
BUN: 16 mg/dL (ref 6–23)
CALCIUM: 9.3 mg/dL (ref 8.4–10.5)
CHLORIDE: 104 meq/L (ref 96–112)
CO2: 30 meq/L (ref 19–32)
Creatinine, Ser: 1.42 mg/dL (ref 0.40–1.50)
GFR: 67.45 mL/min (ref >60.00)
Glucose, Bld: 104 mg/dL — ABNORMAL HIGH (ref 70–99)
POTASSIUM: 4.9 meq/L (ref 3.5–5.1)
Sodium: 139 mEq/L (ref 135–145)
Total Bilirubin: 0.4 mg/dL (ref 0.2–1.2)
Total Protein: 6.9 g/dL (ref 6.0–8.3)

## 2016-05-27 LAB — LIPID PANEL
CHOL/HDL RATIO: 7
CHOLESTEROL: 244 mg/dL — AB (ref 0–200)
HDL: 35.1 mg/dL — AB (ref >39.00)
NonHDL: 208.66
Triglycerides: 287 mg/dL — ABNORMAL HIGH (ref 0.0–149.0)
VLDL: 57.4 mg/dL — AB (ref 0.0–40.0)

## 2016-05-27 LAB — LDL CHOLESTEROL, DIRECT: LDL DIRECT: 174 mg/dL

## 2016-05-27 LAB — HEMOGLOBIN A1C: HEMOGLOBIN A1C: 6.4 % (ref 4.6–6.5)

## 2016-06-01 ENCOUNTER — Ambulatory Visit (INDEPENDENT_AMBULATORY_CARE_PROVIDER_SITE_OTHER): Payer: Medicare Other | Admitting: Endocrinology

## 2016-06-01 ENCOUNTER — Encounter: Payer: Self-pay | Admitting: Endocrinology

## 2016-06-01 VITALS — BP 116/82 | HR 79 | Temp 98.0°F | Resp 16 | Ht 74.0 in | Wt 322.4 lb

## 2016-06-01 DIAGNOSIS — Z23 Encounter for immunization: Secondary | ICD-10-CM | POA: Diagnosis not present

## 2016-06-01 DIAGNOSIS — E1165 Type 2 diabetes mellitus with hyperglycemia: Secondary | ICD-10-CM

## 2016-06-01 DIAGNOSIS — Z794 Long term (current) use of insulin: Secondary | ICD-10-CM | POA: Diagnosis not present

## 2016-06-01 DIAGNOSIS — E785 Hyperlipidemia, unspecified: Secondary | ICD-10-CM | POA: Diagnosis not present

## 2016-06-01 MED ORDER — PRAVASTATIN SODIUM 40 MG PO TABS: 40.0000 mg | ORAL_TABLET | Freq: Every day | ORAL | 3 refills | Status: DC

## 2016-06-01 NOTE — Progress Notes (Signed)
Patient ID: Edward Clarke, male   DOB: [birth year], 50 y.o.   MRN: [id number]   Reason for Appointment: Diabetes follow-up   History of Present Illness    Diagnosis: Type 2 diabetes mellitus, date of diagnosis: 2005.   He has been on basal bolus insulin regimen in the last few years along with metformin Most of the difficulty with control is related to his noncompliance with diet, medications, glucose monitoring and financial issues Frequently will had been running out of one or the other medication or insulin  and blood sugars would not be well controlled  Recent history:   INSULIN: Lantus units 28  bid; Apidra insulin 20 units before meals  He has been able to get Lantus and Apidra through the patient assistance program consistently now  His A1c is surprisingly better at 6.4, previously 7.2 He was able to get Invokamet covered and is taking this since about June 2017  Current management, blood sugar patterns and problems:  He says that he has really tried to improve his diet with the help of his wife and is cutting back on meats and cheeses  Also is trying to be consistent with exercise although he is mostly doing jumping jacks rather than walking ordered, and bike.  He has lost significant amount of weight with starting Invokamet and tolerated this well  Has not changed his insulin dose significantly, only down 2 units on his Lantus  He says that if he takes his Lantus in the morning and does not eat for some time he may feel a little hypoglycemic midday  Again quite irregular with his mealtimes  Most of his blood sugars are being checked in the mornings on these are on an average fairly good but sporadically higher   Proper timing of medications in relation to meals: Yes   Monitors blood glucose: About 1-2 times a day Glucometer: One Touch Verio.  Blood Glucose readings by download for the last 4 weeks:  Mean values apply above for all meters except median  for One Touch  PRE-MEAL Fasting Lunch Dinner Bedtime Overall  Glucose range: 91-188   181  112    Mean/median: 122    118     HYPOGLYCEMIA:   Treatment of hypoglycemia: Usually coffee creamer or food.  Does have glucose tablets at home but does not use them  Meals: Usually 2 meals a day ( lunch 12-1 PM, supper 9 pm)     Carbohydrate intake: Variable, less meat and cheese.  Physical activity: exercise:  Recumbent bike, some walking, jumping jacks   Wt Readings from Last 3 Encounters:  06/01/16 (!) 322 lb 6.4 oz (146.2 kg)  02/18/16 (!) 338 lb (153.3 kg)  01/11/16 (!) 344 lb (156 kg)     The HbgA1c has been as high as 11.6 previously .  Lab Results  Component Value Date   HGBA1C 6.4 05/27/2016   HGBA1C 7.2 (H) 01/07/2016   HGBA1C 7.2 09/09/2015   Lab Results  Component Value Date   MICROALBUR <0.7 06/23/2015   LDLCALC 144 (H) 06/23/2015   CREATININE 1.42 05/27/2016    Lab on 05/27/2016  Component Date Value Ref Range Status  . Hgb A1c MFr Bld 05/27/2016 6.4  4.6 - 6.5 % Final  . Cholesterol 05/27/2016 244* 0 - 200 mg/dL Final  . Triglycerides 05/27/2016 287.0* 0.0 - 149.0 mg/dL Final  . HDL 05/27/2016 35.10* >39.00 mg/dL Final  . VLDL 05/27/2016 57.4* 0.0 - 40.0 mg/dL Final  .  Total CHOL/HDL Ratio 05/27/2016 7   Final  . NonHDL 05/27/2016 208.66   Final  . Sodium 05/27/2016 139  135 - 145 mEq/L Final  . Potassium 05/27/2016 4.9  3.5 - 5.1 mEq/L Final  . Chloride 05/27/2016 104  96 - 112 mEq/L Final  . CO2 05/27/2016 30  19 - 32 mEq/L Final  . Glucose, Bld 05/27/2016 104* 70 - 99 mg/dL Final  . BUN 65/78/469609/22/2017 16  6 - 23 mg/dL Final  . Creatinine, Ser 05/27/2016 1.42  0.40 - 1.50 mg/dL Final  . Total Bilirubin 05/27/2016 0.4  0.2 - 1.2 mg/dL Final  . Alkaline Phosphatase 05/27/2016 73  39 - 117 U/L Final  . AST 05/27/2016 15  0 - 37 U/L Final  . ALT 05/27/2016 15  0 - 53 U/L Final  . Total Protein 05/27/2016 6.9  6.0 - 8.3 g/dL Final  . Albumin 29/52/841309/22/2017 4.2   3.5 - 5.2 g/dL Final  . Calcium 24/40/102709/22/2017 9.3  8.4 - 10.5 mg/dL Final  . GFR 25/36/644009/22/2017 67.45  >60.00 mL/min Final  . Direct LDL 05/27/2016 174.0  mg/dL Final       Medication List       Accurate as of 06/01/16 11:59 PM. Always use your most recent med list.          APIDRA SOLOSTAR 100 UNIT/ML Solostar Pen Generic drug:  Insulin Glulisine Inject 20 Units into the skin 2 (two) times daily.   aspirin EC 81 MG tablet Take 81 mg by mouth every morning. Reported on 02/18/2016   Canagliflozin-Metformin HCl ER 50-1000 MG Tb24 Commonly known as:  INVOKAMET XR Take 2 tablets by mouth daily.   glucose blood test strip Commonly known as:  ONETOUCH VERIO Use as instructed to check blood sugar 4 times per day dx code E11.65   ibuprofen 200 MG tablet Commonly known as:  ADVIL,MOTRIN Take 400-800 mg by mouth every 8 (eight) hours as needed for headache. Reported on 02/18/2016   Insulin Glargine 100 UNIT/ML Solostar Pen Commonly known as:  LANTUS 30 units 2 times per day.   lisinopril 10 MG tablet Commonly known as:  PRINIVIL,ZESTRIL TAKE ONE TABLET BY MOUTH ONCE DAILY   metFORMIN 1000 MG tablet Commonly known as:  GLUCOPHAGE TAKE ONE TABLET BY MOUTH TWICE DAILY WITH MEALS   pravastatin 40 MG tablet Commonly known as:  PRAVACHOL Take 1 tablet (40 mg total) by mouth daily.   tobramycin 0.3 % ophthalmic solution Commonly known as:  TOBREX Place 1 drop into both eyes See admin instructions. Reported on 02/18/2016       Allergies:  Allergies  Allergen Reactions  . Benazepril Other (See Comments)    Reaction unknown - may have been headaches    Past Medical History:  Diagnosis Date  . Diabetes mellitus   . Hyperlipidemia   . Hypertension   . Monoclonal paraproteinemia   . Obesity     Past Surgical History:  Procedure Laterality Date  . APPENDECTOMY    . EYE SURGERY    . TONSILLECTOMY      Family History  Problem Relation Age of Onset  . Diabetes Sister      Social History:  reports that he has quit smoking. His smoking use included Cigarettes. He does not have any smokeless tobacco history on file. He reports that he drinks about 1.2 oz of alcohol per week . He reports that he uses drugs, including Marijuana, about 7 times per week.  Review of Systems:  HYPERTENSION:  has had long-standing hypertension and blood pressure is Controlled with Invokana now  HYPERLIPIDEMIA:  The lipid abnormality consists of elevated LDL treated previously with simvastatin.  He cannot afford the co-pay and probably did not afford the pravastatin that was called in but not clear why he has not taken this  Lab Results  Component Value Date   CHOL 244 (H) 05/27/2016   HDL 35.10 (L) 05/27/2016   LDLCALC 144 (H) 06/23/2015   LDLDIRECT 174.0 05/27/2016   TRIG 287.0 (H) 05/27/2016   CHOLHDL 7 05/27/2016       Examination:   BP 116/82 (Cuff Size: Large)   Pulse 79   Temp 98 F (36.7 C)   Resp 16   Ht 6\' 2"  (1.88 m)   Wt (!) 322 lb 6.4 oz (146.2 kg)   SpO2 96%   BMI 41.39 kg/m   Body mass index is 41.39 kg/m.    Assesment/Plan:   1. Diabetes type 2,  with obesity  See history of present illness for current blood sugar patterns, management  and problems identified His A1c is Much better at 6.4 and he has lost weight with starting Invokana However is taking about the same amount of insulin Currently checking mostly fasting readings and not enough after meals He also thinks that he tends to feel hypoglycemic midday feet takes Lantus in the morning His renal function and blood pressure are excellent with continuing Invokana  He is good candidate for switching from Lantus to Toujeo and this was discussed in detail Most likely he will need about 50 units of this once a day He will continue Apidra unchanged but discussed need to adjust this based on what he is eating and also try to do more postprandial readings which should be at least under 180 He  does need to check more readings after meals Encouraged him to do more aerobic physical activity   2. HYPERTENSION: Not needing blood pressure medications currently but will need to continue monitoring microalbumin    3. Hyperlipidemia: Poorly controlled This is despite his trying to improve his diet He will try to get pravastatin and let us know if he cannot afford this Discussed need for addressing lipids aggressively with his other risk factors    Patient Instructions  Check blood sugars on waking up 4-5x per week  Also check blood sugars about 2 hours after a meal and do this after different meals by rotation  Recommended blood sugar levels on waking up is 90-130 and about 2 hours after meal is 130-160  Please bring your blood sugar monitor to each visit, thank you  Stay on same Lantus daily   Counseling time on subjects discussed above is over 50% of today's 25 minute visit  Kaemon Barnett 06/02/2016, 11:04 AM   Note: This office note was prepared with Estate agent. Any transcriptional errors that result from this process are unintentional.

## 2016-06-01 NOTE — Patient Instructions (Addendum)
Check blood sugars on waking up 4-5x per week  Also check blood sugars about 2 hours after a meal and do this after different meals by rotation  Recommended blood sugar levels on waking up is 90-130 and about 2 hours after meal is 130-160  Please bring your blood sugar monitor to each visit, thank you  Stay on same Lantus daily

## 2016-07-03 ENCOUNTER — Other Ambulatory Visit: Payer: Self-pay | Admitting: Endocrinology

## 2016-07-07 ENCOUNTER — Telehealth: Payer: Self-pay

## 2016-07-07 NOTE — Telephone Encounter (Signed)
Error

## 2016-07-07 NOTE — Telephone Encounter (Signed)
I contacted the patient and advised we have received 4 boxes of the toujeo patient assistance. Patient was advised per Dr. Ronnie Derby last office note on 06/01/2016 to D/C the lantus and to start 50 units of the toujeo daily. Patient verbalized understanding on this and had no further questions at this time.

## 2016-07-14 ENCOUNTER — Other Ambulatory Visit: Payer: Self-pay

## 2016-07-14 MED ORDER — GLUCOSE BLOOD VI STRP: ORAL_STRIP | 5 refills | Status: DC

## 2016-07-22 ENCOUNTER — Telehealth: Payer: Self-pay | Admitting: Endocrinology

## 2016-07-22 NOTE — Telephone Encounter (Signed)
Toujeo 50 units once daily to replace Lantus.  Has he been able to get this ?

## 2016-07-22 NOTE — Telephone Encounter (Signed)
Please clarify the directions for the pt to take the toujeo, in the note it states for toujeo to replace lantus but also a little further down in the note it says to stay on the same lantus dose, please advise

## 2016-07-25 NOTE — Telephone Encounter (Signed)
Pt was notified of instructions, pt verbalized understanding.   

## 2016-08-20 ENCOUNTER — Other Ambulatory Visit: Payer: Self-pay | Admitting: Endocrinology

## 2016-09-02 ENCOUNTER — Other Ambulatory Visit: Payer: Medicare Other

## 2016-09-07 ENCOUNTER — Ambulatory Visit: Payer: Medicare Other | Admitting: Endocrinology

## 2016-09-12 ENCOUNTER — Other Ambulatory Visit: Payer: Self-pay | Admitting: Nurse Practitioner

## 2016-09-13 ENCOUNTER — Telehealth: Payer: Self-pay | Admitting: Endocrinology

## 2016-09-13 NOTE — Telephone Encounter (Signed)
Pt is requesting a call back for clarification of meds please

## 2016-09-19 NOTE — Telephone Encounter (Signed)
Pt needs pravastatin called into [facility]

## 2016-09-19 NOTE — Telephone Encounter (Signed)
Pt clarification question resolved, he needed to see what med he wanted refilled as below

## 2016-09-23 ENCOUNTER — Other Ambulatory Visit: Payer: Self-pay

## 2016-09-23 MED ORDER — INSULIN GLARGINE 300 UNIT/ML ~~LOC~~ SOPN: 50.0000 [IU] | PEN_INJECTOR | Freq: Every day | SUBCUTANEOUS | 2 refills | Status: DC

## 2016-09-26 ENCOUNTER — Telehealth: Payer: Self-pay | Admitting: Endocrinology

## 2016-09-26 NOTE — Telephone Encounter (Signed)
Please call in the pravastatin, the relion pen needles 31 g, invokamet, call into [facility] please

## 2016-09-27 ENCOUNTER — Other Ambulatory Visit: Payer: Self-pay

## 2016-09-27 MED ORDER — PRAVASTATIN SODIUM 40 MG PO TABS: 40.0000 mg | ORAL_TABLET | Freq: Every day | ORAL | 3 refills | Status: DC

## 2016-09-27 MED ORDER — CANAGLIFLOZIN-METFORMIN HCL ER 50-1000 MG PO TB24: 2.0000 | ORAL_TABLET | Freq: Every day | ORAL | 1 refills | Status: DC

## 2016-09-27 MED ORDER — INSULIN PEN NEEDLE 31G X 5 MM MISC: 3 refills | Status: DC

## 2016-09-27 NOTE — Telephone Encounter (Signed)
Ordered 09/27/16 

## 2016-10-10 ENCOUNTER — Other Ambulatory Visit (INDEPENDENT_AMBULATORY_CARE_PROVIDER_SITE_OTHER): Payer: Medicare Other

## 2016-10-10 DIAGNOSIS — E1165 Type 2 diabetes mellitus with hyperglycemia: Secondary | ICD-10-CM

## 2016-10-10 DIAGNOSIS — Z794 Long term (current) use of insulin: Secondary | ICD-10-CM | POA: Diagnosis not present

## 2016-10-10 DIAGNOSIS — E785 Hyperlipidemia, unspecified: Secondary | ICD-10-CM | POA: Diagnosis not present

## 2016-10-10 LAB — URINALYSIS, ROUTINE W REFLEX MICROSCOPIC
Bilirubin Urine: NEGATIVE
HGB URINE DIPSTICK: NEGATIVE
Ketones, ur: NEGATIVE
Leukocytes, UA: NEGATIVE
NITRITE: NEGATIVE
RBC / HPF: NONE SEEN (ref 0–?)
Specific Gravity, Urine: 1.015 (ref 1.000–1.030)
Total Protein, Urine: NEGATIVE
Urobilinogen, UA: 0.2 (ref 0.0–1.0)
WBC UA: NONE SEEN (ref 0–?)
pH: 5.5 (ref 5.0–8.0)

## 2016-10-10 LAB — BASIC METABOLIC PANEL
BUN: 20 mg/dL (ref 6–23)
CO2: 27 meq/L (ref 19–32)
Calcium: 9.6 mg/dL (ref 8.4–10.5)
Chloride: 107 mEq/L (ref 96–112)
Creatinine, Ser: 1.45 mg/dL (ref 0.40–1.50)
GFR: 65.74 mL/min (ref >60.00)
GLUCOSE: 107 mg/dL — AB (ref 70–99)
POTASSIUM: 5 meq/L (ref 3.5–5.1)
SODIUM: 138 meq/L (ref 135–145)

## 2016-10-10 LAB — LIPID PANEL
CHOL/HDL RATIO: 6
Cholesterol: 210 mg/dL — ABNORMAL HIGH (ref 0–200)
HDL: 36.2 mg/dL — AB (ref >39.00)
LDL CALC: 151 mg/dL — AB (ref 0–99)
NONHDL: 174
Triglycerides: 115 mg/dL (ref 0.0–149.0)
VLDL: 23 mg/dL (ref 0.0–40.0)

## 2016-10-10 LAB — MICROALBUMIN / CREATININE URINE RATIO
Creatinine,U: 99.6 mg/dL
Microalb Creat Ratio: 0.7 mg/g (ref 0.0–30.0)
Microalb, Ur: <0.7 mg/dL (ref 0.0–1.9)

## 2016-10-10 LAB — HEMOGLOBIN A1C: Hgb A1c MFr Bld: 6.7 % — ABNORMAL HIGH (ref 4.6–6.5)

## 2016-10-11 DIAGNOSIS — F6381 Intermittent explosive disorder: Secondary | ICD-10-CM | POA: Diagnosis not present

## 2016-10-14 ENCOUNTER — Encounter: Payer: Self-pay | Admitting: Endocrinology

## 2016-10-14 ENCOUNTER — Other Ambulatory Visit: Payer: Self-pay

## 2016-10-14 ENCOUNTER — Ambulatory Visit (INDEPENDENT_AMBULATORY_CARE_PROVIDER_SITE_OTHER): Payer: Medicare Other | Admitting: Endocrinology

## 2016-10-14 VITALS — BP 140/88 | HR 61 | Ht 74.0 in | Wt 323.0 lb

## 2016-10-14 DIAGNOSIS — Z794 Long term (current) use of insulin: Secondary | ICD-10-CM

## 2016-10-14 DIAGNOSIS — I10 Essential (primary) hypertension: Secondary | ICD-10-CM | POA: Diagnosis not present

## 2016-10-14 DIAGNOSIS — E782 Mixed hyperlipidemia: Secondary | ICD-10-CM | POA: Diagnosis not present

## 2016-10-14 DIAGNOSIS — E1165 Type 2 diabetes mellitus with hyperglycemia: Secondary | ICD-10-CM

## 2016-10-14 MED ORDER — PRAVASTATIN SODIUM 40 MG PO TABS: 40.0000 mg | ORAL_TABLET | Freq: Every day | ORAL | 3 refills | Status: DC

## 2016-10-14 MED ORDER — LISINOPRIL 20 MG PO TABS: 20.0000 mg | ORAL_TABLET | Freq: Every day | ORAL | 3 refills | Status: DC

## 2016-10-14 NOTE — Progress Notes (Signed)
Patient ID: Edward Clarke, male   DOB: 1966/04/10, 51 y.o.   MRN: [id number]   Reason for Appointment: Diabetes follow-up   History of Present Illness    Diagnosis: Type 2 diabetes mellitus, date of diagnosis: 2005.   He has been on basal bolus insulin regimen in the last few years along with metformin Most of the difficulty with control is related to his noncompliance with diet, medications, glucose monitoring and financial issues Frequently will had been running out of one or the other medication or insulin  and blood sugars would not be well controlled  Recent history:   INSULIN: Toujeo units 50 units once daily ; Apidra insulin 20 units before meals  He has been able to get Toujeo and Apidra through the patient assistance program consistently   His A1c is slightly higher at 6.7, previously 6.4 He was able to get Invokamet consistently also and is taking this since about June 2017  Current management, blood sugar patterns and problems:  He does not have his monitor today for download  Although he had previously started losing weight with improved diet this has leveled off; he appears to be getting large portions  Doing only brief exercises and no consistent aerobic program  Currently taking the same dose of APIDRA with his meals without adjustment based on his portions of carbohydrate; in the morning if he is eating less bread he will feel a little shaky a couple of later  FASTING readings reportedly are fairly consistently good without overnight hypoglycemia on his regimen of once a day Toujeo now; previously taking 56 units of Lantus of day  He does not remember his readings after meals but sporadically may have readings well above 200 based on his compliance with diet or occasionally forgetting his insulin before eating  Taking Invokamet, 2 tablets consistently without side effects   Proper timing of medications in relation to meals: Yes   Monitors blood  glucose: About 1-2 times a day Glucometer: One Touch Verio.  Blood Glucose readings by recall:   Am 78-107 After meals upto 270  HYPOGLYCEMIA:   Treatment of hypoglycemia: Usually coffee creamer or food.  Does have glucose tablets at home    Meals: Usually 2 meals a day ( lunch 12-1 PM, supper 9 pm)     Carbohydrate intake: Variable   Physical activity: exercise: some walking, jumping jacks   Wt Readings from Last 3 Encounters:  10/14/16 (!) 323 lb (146.5 kg)  06/01/16 (!) 322 lb 6.4 oz (146.2 kg)  02/18/16 (!) 338 lb (153.3 kg)     The HbgA1c has been as high as 11.6 previously .  Lab Results  Component Value Date   HGBA1C 6.7 (H) 10/10/2016   HGBA1C 6.4 05/27/2016   HGBA1C 7.2 (H) 01/07/2016   Lab Results  Component Value Date   MICROALBUR <0.7 10/10/2016   LDLCALC 151 (H) 10/10/2016   CREATININE 1.45 10/10/2016    Lab on 10/10/2016  Component Date Value Ref Range Status  . Hgb A1c MFr Bld 10/10/2016 6.7* 4.6 - 6.5 % Final  . Sodium 10/10/2016 138  135 - 145 mEq/L Final  . Potassium 10/10/2016 5.0  3.5 - 5.1 mEq/L Final  . Chloride 10/10/2016 107  96 - 112 mEq/L Final  . CO2 10/10/2016 27  19 - 32 mEq/L Final  . Glucose, Bld 10/10/2016 107* 70 - 99 mg/dL Final  . BUN 10/10/2016 20  6 - 23 mg/dL Final  . Creatinine, Ser 10/10/2016  1.45  0.40 - 1.50 mg/dL Final  . Calcium 10/10/2016 9.6  8.4 - 10.5 mg/dL Final  . GFR 10/10/2016 65.74  >60.00 mL/min Final  . Cholesterol 10/10/2016 210* 0 - 200 mg/dL Final  . Triglycerides 10/10/2016 115.0  0.0 - 149.0 mg/dL Final  . HDL 10/10/2016 36.20* >39.00 mg/dL Final  . VLDL 10/10/2016 23.0  0.0 - 40.0 mg/dL Final  . LDL Cholesterol 10/10/2016 151* 0 - 99 mg/dL Final  . Total CHOL/HDL Ratio 10/10/2016 6   Final  . NonHDL 10/10/2016 174.00   Final  . Microalb, Ur 10/10/2016 <0.7  0.0 - 1.9 mg/dL Final  . Creatinine,U 10/10/2016 99.6  mg/dL Final  . Microalb Creat Ratio 10/10/2016 0.7  0.0 - 30.0 mg/g Final  . Color,  Urine 10/10/2016 YELLOW  Yellow;Lt. Yellow Final  . APPearance 10/10/2016 CLEAR  Clear Final  . Specific Gravity, Urine 10/10/2016 1.015  1.000 - 1.030 Final  . pH 10/10/2016 5.5  5.0 - 8.0 Final  . Total Protein, Urine 10/10/2016 NEGATIVE  Negative Final  . Urine Glucose 10/10/2016 >=1000* Negative Final  . Ketones, ur 10/10/2016 NEGATIVE  Negative Final  . Bilirubin Urine 10/10/2016 NEGATIVE  Negative Final  . Hgb urine dipstick 10/10/2016 NEGATIVE  Negative Final  . Urobilinogen, UA 10/10/2016 0.2  0.0 - 1.0 Final  . Leukocytes, UA 10/10/2016 NEGATIVE  Negative Final  . Nitrite 10/10/2016 NEGATIVE  Negative Final  . WBC, UA 10/10/2016 none seen  0-2/hpf Final  . RBC / HPF 10/10/2016 none seen  0-2/hpf Final  . Squamous Epithelial / LPF 10/10/2016 Rare(0-4/hpf)  Rare(0-4/hpf) Final     Allergies as of 10/14/2016      Reactions   Benazepril Other (See Comments)   Reaction unknown - may have been headaches      Medication List       Accurate as of 10/14/16 11:59 PM. Always use your most recent med list.          APIDRA SOLOSTAR 100 UNIT/ML Solostar Pen Generic drug:  Insulin Glulisine Inject 20 Units into the skin 2 (two) times daily.   aspirin EC 81 MG tablet Take 81 mg by mouth every morning. Reported on 02/18/2016   Canagliflozin-Metformin HCl ER 50-1000 MG Tb24 Commonly known as:  INVOKAMET XR Take 2 tablets by mouth daily.   glucose blood test strip Commonly known as:  ONETOUCH VERIO USE ONE STRIP TO CHECK GLUCOSE 4 TIMES DAILY Dx code E11.65   ibuprofen 200 MG tablet Commonly known as:  ADVIL,MOTRIN Take 400-800 mg by mouth every 8 (eight) hours as needed for headache. Reported on 02/18/2016   Insulin Glargine 300 UNIT/ML Sopn Commonly known as:  TOUJEO SOLOSTAR Inject 50 Units into the skin daily.   Insulin Pen Needle 31G X 5 MM Misc Use to inject insulin once daily   lisinopril 20 MG tablet Commonly known as:  PRINIVIL,ZESTRIL Take 1 tablet (20 mg total)  by mouth daily.   pravastatin 40 MG tablet Commonly known as:  PRAVACHOL Take 1 tablet (40 mg total) by mouth daily.   tobramycin 0.3 % ophthalmic solution Commonly known as:  TOBREX Place 1 drop into both eyes See admin instructions. Reported on 02/18/2016       Allergies:  Allergies  Allergen Reactions  . Benazepril Other (See Comments)    Reaction unknown - may have been headaches    Past Medical History:  Diagnosis Date  . Diabetes mellitus   . Hyperlipidemia   . Hypertension   .  Monoclonal paraproteinemia   . Obesity     Past Surgical History:  Procedure Laterality Date  . APPENDECTOMY    . EYE SURGERY    . TONSILLECTOMY      Family History  Problem Relation Age of Onset  . Diabetes Sister     Social History:  reports that he has quit smoking. His smoking use included Cigarettes. He has never used smokeless tobacco. He reports that he drinks about 1.2 oz of alcohol per week . He reports that he uses drugs, including Marijuana, about 7 times per week.  Review of Systems:  His vision is getting somewhat better  HYPERTENSION:  has had long-standing hypertension and blood pressure is being treated with low-dose lisinopril and blood pressure.  Relatively higher than usual today This is despite continuing Invokana  HYPERLIPIDEMIA:  The lipid abnormality consists of elevated LDL treated previously with simvastatin.  He cannot afford the co-pay and did not pick up the prescription of pravastatin that was called into [facility]   Lab Results  Component Value Date   CHOL 210 (H) 10/10/2016   HDL 36.20 (L) 10/10/2016   LDLCALC 151 (H) 10/10/2016   LDLDIRECT 174.0 05/27/2016   TRIG 115.0 10/10/2016   CHOLHDL 6 10/10/2016       Examination:   BP 140/88   Pulse 61   Ht 6\' 2"  (1.88 m)   Wt (!) 323 lb (146.5 kg)   SpO2 97%   BMI 41.47 kg/m   Body mass index is 41.47 kg/m.    Assesment/Plan:   1. Diabetes type 2,  with obesity  See history of present  illness for current blood sugar patterns, management  and problems identified  His A1c is still reasonably controlled at 6.7 Overall has been able to control blood sugars better with continuing Invokana Unable to review his blood sugars at home and not clear what his blood sugar patterns are Also doing better with TOUJEO compared to Lantus and fasting readings are reportedly fairly stable  RECOMMENDATIONS:  Cut back on portions, this may enable him to take relatively less insulin at mealtimes also  More consistent exercise.  More readings after meals and bring monitor for download on each visit  No change in Toujeo as long as fasting readings are consistent.    2. HYPERTENSION: Not controlled, he thinks he is taking lisinopril 10 mg daily Will go up to 20 mg Also reminded him to be consistent with sodium intake especially with packaged foods    3. Hyperlipidemia: Poorly controlled Discussed need to take cholesterol-lowering medications long-term and he will try to get the pravastatin or alternatives from his drugstore now  Discussed LDL targets   Counseling time on subjects discussed above is over 50% of today's 25 minute visit   Patient Instructions  2 Lisinopril  More exercise  Check on Pravastain     [facility] 10/16/2016, 11:07 AM   Note: This office note was prepared with Estate agent. Any transcriptional errors that result from this process are unintentional.

## 2016-10-14 NOTE — Patient Instructions (Signed)
2 Lisinopril  More exercise  Check on Pravastain

## 2016-10-18 ENCOUNTER — Other Ambulatory Visit: Payer: Self-pay

## 2016-10-18 ENCOUNTER — Telehealth: Payer: Self-pay | Admitting: Endocrinology

## 2016-10-18 MED ORDER — INSULIN GLULISINE 100 UNIT/ML SOLOSTAR PEN: 20.0000 [IU] | PEN_INJECTOR | Freq: Two times a day (BID) | SUBCUTANEOUS | 2 refills | Status: DC

## 2016-10-18 NOTE — Telephone Encounter (Signed)
Ordered 10/18/16 

## 2016-10-18 NOTE — Telephone Encounter (Signed)
Patient need refill of medication of Insulin Glulisine (APIDRA SOLOSTAR) 100 UNIT/ML Solostar Pen

## 2016-10-31 NOTE — Telephone Encounter (Signed)
Pending the receipt of the apidra and toujeo from his pt assistance program please call him as soon as this arrives, he is completely out.

## 2016-10-31 NOTE — Telephone Encounter (Signed)
Spoke with [employer] rep and she informed me that patient asistance has run out for apidra and I am waiting for a form for the toujeo- I will fax a new application for this patient today

## 2016-11-17 ENCOUNTER — Telehealth: Payer: Self-pay | Admitting: Endocrinology

## 2016-11-17 NOTE — Telephone Encounter (Signed)
I called patient to inform him that his Nelva Nay is here in the office, his Apidra is still not in.

## 2016-11-17 NOTE — Telephone Encounter (Signed)
Pt called in to see if his order had arrived, he said he has been out of the Toujeo a week ago and now the Apidra since yesterday.  He wants to know what to do about it since his levels will be so far off.  He would also like to know if home delivery is an option.

## 2016-12-12 ENCOUNTER — Other Ambulatory Visit: Payer: Medicare Other

## 2016-12-12 ENCOUNTER — Other Ambulatory Visit: Payer: Self-pay

## 2016-12-12 ENCOUNTER — Telehealth: Payer: Self-pay | Admitting: Endocrinology

## 2016-12-12 MED ORDER — CANAGLIFLOZIN-METFORMIN HCL ER 50-1000 MG PO TB24: 2.0000 | ORAL_TABLET | Freq: Every day | ORAL | 1 refills | Status: DC

## 2016-12-12 NOTE — Telephone Encounter (Signed)
Pt called today about a refill for Canagliflozin-Metformin HCl ER (INVOKAMET XR) 50-1000 MG TB24.  He wants to make sure that it is sent in.

## 2016-12-12 NOTE — Telephone Encounter (Signed)
Pt also stated that he hasn't heard about picking up his Insulin Glulisine (APIDRA SOLOSTAR) 100 UNIT/ML Solostar Pen since he is on [employer] assistance program.

## 2016-12-12 NOTE — Telephone Encounter (Signed)
Invokamet has been ordered and Apidra is not here yet

## 2016-12-15 ENCOUNTER — Ambulatory Visit: Payer: Medicare Other | Admitting: Endocrinology

## 2017-01-02 ENCOUNTER — Other Ambulatory Visit (INDEPENDENT_AMBULATORY_CARE_PROVIDER_SITE_OTHER): Payer: Medicare Other

## 2017-01-02 DIAGNOSIS — E1165 Type 2 diabetes mellitus with hyperglycemia: Secondary | ICD-10-CM | POA: Diagnosis not present

## 2017-01-02 DIAGNOSIS — Z794 Long term (current) use of insulin: Secondary | ICD-10-CM

## 2017-01-02 LAB — LIPID PANEL
CHOL/HDL RATIO: 6
Cholesterol: 222 mg/dL — ABNORMAL HIGH (ref 0–200)
HDL: 36.5 mg/dL — AB (ref >39.00)
LDL Cholesterol: 166 mg/dL — ABNORMAL HIGH (ref 0–99)
NONHDL: 185.78
Triglycerides: 101 mg/dL (ref 0.0–149.0)
VLDL: 20.2 mg/dL (ref 0.0–40.0)

## 2017-01-02 LAB — COMPREHENSIVE METABOLIC PANEL
ALBUMIN: 4.4 g/dL (ref 3.5–5.2)
ALK PHOS: 65 U/L (ref 39–117)
ALT: 14 U/L (ref 0–53)
AST: 14 U/L (ref 0–37)
BILIRUBIN TOTAL: 0.5 mg/dL (ref 0.2–1.2)
BUN: 17 mg/dL (ref 6–23)
CALCIUM: 9.9 mg/dL (ref 8.4–10.5)
CHLORIDE: 103 meq/L (ref 96–112)
CO2: 29 mEq/L (ref 19–32)
CREATININE: 1.5 mg/dL (ref 0.40–1.50)
GFR: 63.16 mL/min (ref >60.00)
Glucose, Bld: 148 mg/dL — ABNORMAL HIGH (ref 70–99)
POTASSIUM: 5.2 meq/L — AB (ref 3.5–5.1)
Sodium: 137 mEq/L (ref 135–145)
Total Protein: 7 g/dL (ref 6.0–8.3)

## 2017-01-03 LAB — FRUCTOSAMINE: FRUCTOSAMINE: 337 umol/L — AB (ref 0–285)

## 2017-01-04 ENCOUNTER — Ambulatory Visit (INDEPENDENT_AMBULATORY_CARE_PROVIDER_SITE_OTHER): Payer: Medicare Other | Admitting: Endocrinology

## 2017-01-04 ENCOUNTER — Encounter: Payer: Self-pay | Admitting: Endocrinology

## 2017-01-04 ENCOUNTER — Other Ambulatory Visit: Payer: Self-pay

## 2017-01-04 VITALS — BP 124/88 | HR 68 | Ht 74.0 in | Wt 319.4 lb

## 2017-01-04 DIAGNOSIS — E1165 Type 2 diabetes mellitus with hyperglycemia: Secondary | ICD-10-CM | POA: Diagnosis not present

## 2017-01-04 DIAGNOSIS — Z794 Long term (current) use of insulin: Secondary | ICD-10-CM

## 2017-01-04 MED ORDER — PRAVASTATIN SODIUM 80 MG PO TABS: 80.0000 mg | ORAL_TABLET | Freq: Every day | ORAL | 0 refills | Status: DC

## 2017-01-04 MED ORDER — CANAGLIFLOZIN-METFORMIN HCL ER 50-1000 MG PO TB24: 2.0000 | ORAL_TABLET | Freq: Every day | ORAL | 0 refills | Status: DC

## 2017-01-04 MED ORDER — GLUCOSE BLOOD VI STRP: ORAL_STRIP | 0 refills | Status: DC

## 2017-01-04 MED ORDER — LISINOPRIL 20 MG PO TABS: 20.0000 mg | ORAL_TABLET | Freq: Every day | ORAL | 0 refills | Status: DC

## 2017-01-04 NOTE — Progress Notes (Signed)
Patient ID: Edward Clarke, male   DOB: 23-Dec-1965, 51 y.o.   MRN: [id number]   Reason for Appointment: Diabetes follow-up   History of Present Illness    Diagnosis: Type 2 diabetes mellitus, date of diagnosis: 2005.   He has been on basal bolus insulin regimen in the last few years along with metformin Most of the difficulty with control is related to his noncompliance with diet, medications, glucose monitoring and financial issues Frequently will had been running out of one or the other medication or insulin  and blood sugars would not be well controlled  Recent history:   INSULIN: Toujeo units 50-70 units once daily ; Apidra insulin 0 units before meals  He has been able to get Toujeo but not Apidra through the patient assistance program consistently   His A1c on the last visit but 6.7 but his fructosamine is hired 337 none  He was able to get Invokamet consistently but ran out about a week ago  Current management, blood sugar patterns and problems:  He does have his monitor today for download  He says that he has not been able to get his Apidra to the assistance program for about 3 months  Not checking blood sugars after meals  Blood sugars are variable and he will take extra 20 units of Toujeo in the evening at times  He has not been motivated to exercise  He thinks he is watching his diet except eating a lot of cheese  Taking Invokamet, 2 tablets without side effects but not for the last week or so  Monitors blood glucose: About 1-2 times a day Glucometer: One Touch Verio.   Blood Glucose readings by   Am blood sugar range 94-1 91, bedtime 214 and overall MEDIAN 148  HYPOGLYCEMIA:   Treatment of hypoglycemia: Usually coffee creamer or food.  Does have glucose tablets at home    Meals: Usually 2 meals a day ( lunch 12-1 PM, supper 9 pm)     Carbohydrate intake: Variable   Physical activity: exercise: some jumping jacks   Wt Readings from Last 3  Encounters:  01/04/17 (!) 319 lb 6.4 oz (144.9 kg)  10/14/16 (!) 323 lb (146.5 kg)  06/01/16 (!) 322 lb 6.4 oz (146.2 kg)     The HbgA1c has been as high as 11.6 previously .  Lab Results  Component Value Date   HGBA1C 6.7 (H) 10/10/2016   HGBA1C 6.4 05/27/2016   HGBA1C 7.2 (H) 01/07/2016   Lab Results  Component Value Date   MICROALBUR <0.7 10/10/2016   LDLCALC 166 (H) 01/02/2017   CREATININE 1.50 01/02/2017    Lab on 01/02/2017  Component Date Value Ref Range Status  . Sodium 01/02/2017 137  135 - 145 mEq/L Final  . Potassium 01/02/2017 5.2* 3.5 - 5.1 mEq/L Final  . Chloride 01/02/2017 103  96 - 112 mEq/L Final  . CO2 01/02/2017 29  19 - 32 mEq/L Final  . Glucose, Bld 01/02/2017 148* 70 - 99 mg/dL Final  . BUN 01/02/2017 17  6 - 23 mg/dL Final  . Creatinine, Ser 01/02/2017 1.50  0.40 - 1.50 mg/dL Final  . Total Bilirubin 01/02/2017 0.5  0.2 - 1.2 mg/dL Final  . Alkaline Phosphatase 01/02/2017 65  39 - 117 U/L Final  . AST 01/02/2017 14  0 - 37 U/L Final  . ALT 01/02/2017 14  0 - 53 U/L Final  . Total Protein 01/02/2017 7.0  6.0 - 8.3 g/dL Final  .  Albumin 01/02/2017 4.4  3.5 - 5.2 g/dL Final  . Calcium 01/02/2017 9.9  8.4 - 10.5 mg/dL Final  . GFR 01/02/2017 63.16  >60.00 mL/min Final  . Fructosamine 01/02/2017 337* 0 - 285 umol/L Final   Comment: Published reference interval for apparently healthy subjects between age 24 and 39 is 76 - 285 umol/L and in a poorly controlled diabetic population is 228 - 563 umol/L with a mean of 396 umol/L.   [name] Cholesterol 01/02/2017 222* 0 - 200 mg/dL Final  . Triglycerides 01/02/2017 101.0  0.0 - 149.0 mg/dL Final  . HDL 01/02/2017 36.50* >39.00 mg/dL Final  . VLDL 01/02/2017 20.2  0.0 - 40.0 mg/dL Final  . LDL Cholesterol 01/02/2017 166* 0 - 99 mg/dL Final  . Total CHOL/HDL Ratio 01/02/2017 6   Final  . NonHDL 01/02/2017 185.78   Final     Allergies as of 01/04/2017      Reactions   Benazepril Other (See Comments)    Reaction unknown - may have been headaches      Medication List       Accurate as of 01/04/17 10:58 AM. Always use your most recent med list.          aspirin EC 81 MG tablet Take 81 mg by mouth every morning. Reported on 02/18/2016   Canagliflozin-Metformin HCl ER 50-1000 MG Tb24 Commonly known as:  INVOKAMET XR Take 2 tablets by mouth daily.   glucose blood test strip Commonly known as:  ONETOUCH VERIO USE ONE STRIP TO CHECK GLUCOSE 4 TIMES DAILY Dx code E11.65   ibuprofen 200 MG tablet Commonly known as:  ADVIL,MOTRIN Take 400-800 mg by mouth every 8 (eight) hours as needed for headache. Reported on 02/18/2016   Insulin Glargine 300 UNIT/ML Sopn Commonly known as:  TOUJEO SOLOSTAR Inject 50 Units into the skin daily.   Insulin Glulisine 100 UNIT/ML Solostar Pen Commonly known as:  APIDRA SOLOSTAR Inject 20 Units into the skin 2 (two) times daily.   Insulin Pen Needle 31G X 5 MM Misc Use to inject insulin once daily   lisinopril 20 MG tablet Commonly known as:  PRINIVIL,ZESTRIL Take 1 tablet (20 mg total) by mouth daily.   pravastatin 40 MG tablet Commonly known as:  PRAVACHOL Take 1 tablet (40 mg total) by mouth daily.   tobramycin 0.3 % ophthalmic solution Commonly known as:  TOBREX Place 1 drop into both eyes See admin instructions. Reported on 02/18/2016       Allergies:  Allergies  Allergen Reactions  . Benazepril Other (See Comments)    Reaction unknown - may have been headaches    Past Medical History:  Diagnosis Date  . Diabetes mellitus   . Hyperlipidemia   . Hypertension   . Monoclonal paraproteinemia   . Obesity     Past Surgical History:  Procedure Laterality Date  . APPENDECTOMY    . EYE SURGERY    . TONSILLECTOMY      Family History  Problem Relation Age of Onset  . Diabetes Sister     Social History:  reports that he has quit smoking. His smoking use included Cigarettes. He has never used smokeless tobacco. He reports that he  drinks about 1.2 oz of alcohol per week . He reports that he uses drugs, including Marijuana, about 7 times per week.  Review of Systems:   HYPERTENSION:  has had long-standing hypertension and blood pressure is being treated with low-dose lisinopril and blood pressure.   Relatively  higher blood pressure but he has been out of his lisinopril for at least a week   HYPERLIPIDEMIA:  The lipid abnormality consists of elevated LDL treated with pravastatin However he thinks he has not taken this for at least a week and his LDL is significantly high    Lab Results  Component Value Date   CHOL 222 (H) 01/02/2017   HDL 36.50 (L) 01/02/2017   LDLCALC 166 (H) 01/02/2017   LDLDIRECT 174.0 05/27/2016   TRIG 101.0 01/02/2017   CHOLHDL 6 01/02/2017       Examination:   BP (!) 148/112   Pulse 68   Ht 6\' 2"  (1.88 m)   Wt (!) 319 lb 6.4 oz (144.9 kg)   SpO2 95%   BMI 41.01 kg/m   Body mass index is 41.01 kg/m.    Assesment/Plan:   1. Diabetes type 2,  with obesity  See history of present illness for current blood sugar patterns, management  and problems identified  His Blood sugars are not controlled because of running out of Apidra Fasting readings are variable probably dependent on how his blood sugars at the night before Although he has lost a little weight and is benefiting from Invokamet he is not exercising Also can be consistent with diet also  RECOMMENDATIONS:  More consistent exercise.  More readings after meals and bring monitor for download on each visit  No change in Toujeo unless fasting blood sugars start getting out of)  Will try to get his Apidra reordered   2. HYPERTENSION: Not controlled, he needs to take his lisinopril consistently   3. Hyperlipidemia: Poorly controlled  Although he says he has been taking pravastatin not clear how long he has not taken this and LDL is 166 Will increase the dose to 80 mg    Patient Instructions  Walk briskly  daily  Check blood sugars on waking up 3x weekly  Also check blood sugars about 2 hours after a meal and do this after different meals by rotation  Recommended blood sugar levels on waking up is 90-130 and about 2 hours after meal is 130-160  Please bring your blood sugar monitor to each visit, thank you    [facility] 01/04/2017, 10:58 AM   Note: This office note was prepared with Dragon voice recognition system technology. Any transcriptional errors that result from this process are unintentional.

## 2017-01-04 NOTE — Patient Instructions (Signed)
Walk briskly daily  Check blood sugars on waking up 3x weekly  Also check blood sugars about 2 hours after a meal and do this after different meals by rotation  Recommended blood sugar levels on waking up is 90-130 and about 2 hours after meal is 130-160  Please bring your blood sugar monitor to each visit, thank you

## 2017-01-13 DIAGNOSIS — F6381 Intermittent explosive disorder: Secondary | ICD-10-CM | POA: Diagnosis not present

## 2017-02-04 DIAGNOSIS — E119 Type 2 diabetes mellitus without complications: Secondary | ICD-10-CM | POA: Diagnosis not present

## 2017-02-04 DIAGNOSIS — H40033 Anatomical narrow angle, bilateral: Secondary | ICD-10-CM | POA: Diagnosis not present

## 2017-02-16 ENCOUNTER — Telehealth: Payer: Self-pay | Admitting: Endocrinology

## 2017-02-16 ENCOUNTER — Telehealth: Payer: Self-pay

## 2017-02-16 NOTE — Telephone Encounter (Signed)
Called patient and left a voice message to let him know that his insulin Toujeo pens came in to our office and they are ready for him to pick up.

## 2017-02-16 NOTE — Telephone Encounter (Signed)
**  Remind patient they can make refill requests via MyChart**  Medication refill request (Name & Dosage):  Insulin Glargine (TOUJEO SOLOSTAR) 300 UNIT/ML SOPN  Preferred pharmacy (Name & Address):  Contact manufacturer to have order placed and delivered to [facility].    Other comments (if applicable):   Call patient once the rx is at the office for pick up

## 2017-02-17 NOTE — Telephone Encounter (Signed)
Have spoken to the patient and he will be bringing in the necessary paperwork on Monday so that it can be filled out and faxed

## 2017-02-20 DIAGNOSIS — F6381 Intermittent explosive disorder: Secondary | ICD-10-CM | POA: Diagnosis not present

## 2017-03-16 ENCOUNTER — Telehealth: Payer: Self-pay

## 2017-03-16 NOTE — Telephone Encounter (Signed)
No

## 2017-03-16 NOTE — Telephone Encounter (Signed)
Please advise. Do you have the paperwork?

## 2017-03-16 NOTE — Telephone Encounter (Signed)
Patient called in states that he dropped off some paperwork for his patient assistance forms (from manufacture) for the medication Apidra. (he has the Toujeo already), states he has not heard back from this and he is completely out and has not taken it in a couple of days. Also patient states that the pharmacy advised him when he went to get his refill for the invokamet that it also needed to have authorization done as they have sent [name] notifications and nothing has been done. He is almost out of this as well.   Patient advised that he could no longer take the Pravastatin medication as it was too expensive, that he would have to start eating healthier because he could not pay the amount on it every month. He wanted Dr.[name] to be aware.   Please advise as soon as possible on this paperwork and get it submitted, and notify patient when it is faxed and completed.

## 2017-03-17 NOTE — Telephone Encounter (Signed)
Called patient and let him know that I have not seen the paperwork and if he has a copy to please bring to me on Monday and I will have Dr. Dwyane Dee fill out for him.

## 2017-03-20 NOTE — Telephone Encounter (Signed)
Called patient back after [facility] re-run his invokamet xr and it is still not covered I explained to him that he needs to call insurance company and find out what is covered- patient stated an understanding and stated he would call back tomorrow with that information

## 2017-03-20 NOTE — Telephone Encounter (Signed)
Patient called me this morning and stated that he did not find his paperwork. He thinks from [employer]. Once I check through the paperwork again, if I do not find the packet I will call and let him know.

## 2017-03-20 NOTE — Telephone Encounter (Signed)
Spoke with the patient and he is aware that his paperwork was already faxed out on 02/01/17 he asked if we can re-fax it and he will wait a couple of days and call [employer] and Wynetta Emery and [employer] to make sure these went through- patient also state that the invokamet prescription that is asking for a PA only needs to have the pharmacy re-run it under medicare- this issue has been resolved

## 2017-03-21 ENCOUNTER — Telehealth: Payer: Self-pay

## 2017-03-21 NOTE — Telephone Encounter (Signed)
Called patient and left a voice message that I have made copies of the [employer] and the [employer] paperwork and they are waiting up front for him to pick up and also I have re-faxed both of these paperwork to each place again.

## 2017-03-21 NOTE — Telephone Encounter (Signed)
Patient called to let us know that he called Medicare and they told us to call the Provider Line to see what is covered if the Invokamet is not covered. Not sure what other information Edward Clarke has on this. Please advise.

## 2017-03-23 NOTE — Telephone Encounter (Signed)
Left vm informing patient that he needs to find out from insurance company what is covered

## 2017-03-31 ENCOUNTER — Telehealth: Payer: Self-pay | Admitting: Endocrinology

## 2017-03-31 NOTE — Telephone Encounter (Signed)
Jake from [facility] calling in reference to check the status of medical records being sent for patient.   Fax number [phone]

## 2017-04-03 ENCOUNTER — Other Ambulatory Visit: Payer: Self-pay

## 2017-04-03 MED ORDER — CANAGLIFLOZIN-METFORMIN HCL ER 50-1000 MG PO TB24: 2.0000 | ORAL_TABLET | Freq: Every day | ORAL | 0 refills | Status: DC

## 2017-04-03 NOTE — Telephone Encounter (Signed)
Colletta [place], please advise. I accidentally sent to Dr. Dwyane Dee. Thanks!

## 2017-04-03 NOTE — Telephone Encounter (Signed)
Do you know if these were sent? I am not sure if this was faxed to medical records?

## 2017-04-05 ENCOUNTER — Telehealth: Payer: Self-pay | Admitting: Endocrinology

## 2017-04-05 NOTE — Telephone Encounter (Signed)
[facility] second time calling to check on status of medical records for patient. Please call and advise.

## 2017-04-07 ENCOUNTER — Other Ambulatory Visit: Payer: Medicare Other

## 2017-04-07 NOTE — Telephone Encounter (Signed)
Please forward this call to the Medical Records dept so they can address the concern. I do not have the number for [employer] in order to tell them this.

## 2017-04-12 ENCOUNTER — Ambulatory Visit (INDEPENDENT_AMBULATORY_CARE_PROVIDER_SITE_OTHER): Payer: Medicare Other | Admitting: Endocrinology

## 2017-04-12 ENCOUNTER — Encounter: Payer: Self-pay | Admitting: Endocrinology

## 2017-04-12 ENCOUNTER — Other Ambulatory Visit (INDEPENDENT_AMBULATORY_CARE_PROVIDER_SITE_OTHER): Payer: Medicare Other

## 2017-04-12 VITALS — BP 115/82 | HR 69 | Ht 74.0 in | Wt 311.8 lb

## 2017-04-12 DIAGNOSIS — E1165 Type 2 diabetes mellitus with hyperglycemia: Secondary | ICD-10-CM

## 2017-04-12 DIAGNOSIS — Z794 Long term (current) use of insulin: Secondary | ICD-10-CM | POA: Diagnosis not present

## 2017-04-12 DIAGNOSIS — I10 Essential (primary) hypertension: Secondary | ICD-10-CM | POA: Diagnosis not present

## 2017-04-12 DIAGNOSIS — E782 Mixed hyperlipidemia: Secondary | ICD-10-CM | POA: Diagnosis not present

## 2017-04-12 LAB — LIPID PANEL
CHOL/HDL RATIO: 5
Cholesterol: 237 mg/dL — ABNORMAL HIGH (ref 0–200)
HDL: 44.1 mg/dL (ref >39.00)
LDL Cholesterol: 170 mg/dL — ABNORMAL HIGH (ref 0–99)
NonHDL: 192.78
TRIGLYCERIDES: 112 mg/dL (ref 0.0–149.0)
VLDL: 22.4 mg/dL (ref 0.0–40.0)

## 2017-04-12 LAB — POCT GLYCOSYLATED HEMOGLOBIN (HGB A1C): Hemoglobin A1C: 6.8

## 2017-04-12 LAB — COMPREHENSIVE METABOLIC PANEL
ALBUMIN: 4.4 g/dL (ref 3.5–5.2)
ALK PHOS: 56 U/L (ref 39–117)
ALT: 12 U/L (ref 0–53)
AST: 14 U/L (ref 0–37)
BUN: 27 mg/dL — ABNORMAL HIGH (ref 6–23)
CALCIUM: 9.9 mg/dL (ref 8.4–10.5)
CO2: 29 meq/L (ref 19–32)
CREATININE: 1.58 mg/dL — AB (ref 0.40–1.50)
Chloride: 101 mEq/L (ref 96–112)
GFR: 59.42 mL/min — AB (ref >60.00)
Glucose, Bld: 139 mg/dL — ABNORMAL HIGH (ref 70–99)
Potassium: 4.7 mEq/L (ref 3.5–5.1)
Sodium: 135 mEq/L (ref 135–145)
TOTAL PROTEIN: 7 g/dL (ref 6.0–8.3)
Total Bilirubin: 0.4 mg/dL (ref 0.2–1.2)

## 2017-04-12 MED ORDER — METFORMIN HCL 1000 MG PO TABS: 1000.0000 mg | ORAL_TABLET | Freq: Two times a day (BID) | ORAL | 3 refills | Status: DC

## 2017-04-12 MED ORDER — ATORVASTATIN CALCIUM 10 MG PO TABS: 10.0000 mg | ORAL_TABLET | Freq: Every day | ORAL | 3 refills | Status: DC

## 2017-04-12 NOTE — Patient Instructions (Signed)
Check blood sugars on waking up  3/7 days  Also check blood sugars about 2 hours after a meal and do this after different meals by rotation  Recommended blood sugar levels on waking up is 90-130 and about 2 hours after meal is 130-160  Please bring your blood sugar monitor to each visit, thank you   

## 2017-04-12 NOTE — Progress Notes (Signed)
Patient ID: Edward Clarke, male   DOB: May 25, 1966, 51 y.o.   MRN: [id number]   Reason for Appointment: Diabetes follow-up   History of Present Illness    Diagnosis: Type 2 diabetes mellitus, date of diagnosis: 2005.   He has been on basal bolus insulin regimen in the last few years along with metformin Most of the difficulty with control is related to his noncompliance with diet, medications, glucose monitoring and financial issues Frequently will had been running out of one or the other medication or insulin  and blood sugars would not be well controlled  Recent history:   INSULIN: Toujeo units 50 units once daily ; Apidra insulin 0 units before meals  He has been able to get Toujeo but not Apidra through the patient assistance program consistently    His A1c on the last visit but 6.7 now 6.8  He was not able to get Invokamet for about the last month  Current management, blood sugar patterns and problems:  Even with not taking Invokamet and mealtime insulin his blood sugars overall are not higher  However he does not check readings after meals  He is continuing to lose weight although not clear if he lost any weight in the last month without Invokana  Still not motivated to exercise  However has been generally watching his portions and high-fat meals more recently  FASTING readings are generally fairly good with median reading under 130  Monitors blood glucose: About 1-2 times a day Glucometer: One Touch Verio.   Blood Glucose readings by   Am blood sugar range 87-1 69 with median about 129, late afternoon 125, 154  HYPOGLYCEMIA:  None Treatment of hypoglycemia: Usually coffee creamer or food.  Does have glucose tablets at home    Meals: Usually 2 meals a day ( lunch 12-1 PM, supper 9 pm) Rare fast food  Physical activity: exercise: some   Wt Readings from Last 3 Encounters:  04/12/17 (!) 311 lb 12.8 oz (141.4 kg)  01/04/17 (!) 319 lb 6.4 oz (144.9  kg)  10/14/16 (!) 323 lb (146.5 kg)     The HbgA1c has been as high as 11.6 previously .  Lab Results  Component Value Date   HGBA1C 6.8 04/12/2017   HGBA1C 6.7 (H) 10/10/2016   HGBA1C 6.4 05/27/2016   Lab Results  Component Value Date   MICROALBUR <0.7 10/10/2016   LDLCALC 170 (H) 04/12/2017   CREATININE 1.58 (H) 04/12/2017       Allergies as of 04/12/2017      Reactions   Benazepril Other (See Comments)   Reaction unknown - may have been headaches      Medication List       Accurate as of 04/12/17  8:59 PM. Always use your most recent med list.          aspirin EC 81 MG tablet Take 81 mg by mouth every morning. Reported on 02/18/2016   Canagliflozin-Metformin HCl ER 50-1000 MG Tb24 Commonly known as:  INVOKAMET XR Take 2 tablets by mouth daily.   glucose blood test strip Commonly known as:  ONETOUCH VERIO USE ONE STRIP TO CHECK GLUCOSE 4 TIMES DAILY Dx code E11.65   ibuprofen 200 MG tablet Commonly known as:  ADVIL,MOTRIN Take 400-800 mg by mouth every 8 (eight) hours as needed for headache. Reported on 02/18/2016   Insulin Glargine 300 UNIT/ML Sopn Commonly known as:  TOUJEO SOLOSTAR Inject 50 Units into the skin daily.   Insulin Glulisine  100 UNIT/ML Solostar Pen Commonly known as:  APIDRA SOLOSTAR Inject 20 Units into the skin 2 (two) times daily.   Insulin Pen Needle 31G X 5 MM Misc Use to inject insulin once daily   lisinopril 20 MG tablet Commonly known as:  PRINIVIL,ZESTRIL Take 1 tablet (20 mg total) by mouth daily.       Allergies:  Allergies  Allergen Reactions  . Benazepril Other (See Comments)    Reaction unknown - may have been headaches    Past Medical History:  Diagnosis Date  . Diabetes mellitus   . Hyperlipidemia   . Hypertension   . Monoclonal paraproteinemia   . Obesity     Past Surgical History:  Procedure Laterality Date  . APPENDECTOMY    . EYE SURGERY    . TONSILLECTOMY      Family History  Problem  Relation Age of Onset  . Diabetes Sister     Social History:  reports that he has quit smoking. His smoking use included Cigarettes. He has never used smokeless tobacco. He reports that he drinks about 1.2 oz of alcohol per week . He reports that he uses drugs, including Marijuana, about 7 times per week.  Review of Systems:   HYPERTENSION:  has had long-standing hypertension and blood pressure is being treated with lisinopril and blood pressure Is fairly good.   Blood pressure does not appear to have increased with stopping Invokana  HYPERLIPIDEMIA:  The lipid abnormality consists of elevated LDL treated with pravastatin However he thinks he has not taken this because of high cost LDL is higher as expected  Lab Results  Component Value Date   CHOL 237 (H) 04/12/2017   HDL 44.10 04/12/2017   LDLCALC 170 (H) 04/12/2017   LDLDIRECT 174.0 05/27/2016   TRIG 112.0 04/12/2017   CHOLHDL 5 04/12/2017       Examination:   BP 115/82 (Cuff Size: Large)   Pulse 69   Ht 6\' 2"  (1.88 m)   Wt (!) 311 lb 12.8 oz (141.4 kg)   SpO2 97%   BMI 40.03 kg/m   Body mass index is 40.03 kg/m.   No pedal edema present  Assesment/Plan:   1. Diabetes type 2,  with obesity  See history of present illness for current blood sugar patterns, management  and problems identified  Surprisingly his A1c is still under 7% with only taking basal insulin recently Previously on mealtime insulin also He may be more insulin sensitive with his weight loss and continued improved diet  RECOMMENDATIONS:  Encouraged him to start consistent exercise.  He needs to start checking blood sugar readings after meals especially in the evening when he is eating more  Explained to him that [employer] does not cover evening postprandial readings and need to be clear if he has high readings after eating  Discussed postprandial blood sugar targets  He needs to have consistent diet with some protein at each meal and low-fat  intake  He will watch snacks late at night  He will again try to get Apidra from the patient assistance program  Will start metformin for now and consider adding Invokana later if covered    2. HYPERTENSION: Better controlled with taking lisinopril consistently Blood pressure needs to be checked with large cuff   3. Hyperlipidemia: Poorly controlled.  Will try prescribing Lipitor 10 mg instead of pravastatin Discussed importance of good LDL controlled for prevention of cardiovascular events with his diabetes    Patient Instructions  Check  blood sugars on waking up  3/7 days  Also check blood sugars about 2 hours after a meal and do this after different meals by rotation  Recommended blood sugar levels on waking up is 90-130 and about 2 hours after meal is 130-160  Please bring your blood sugar monitor to each visit, thank you   Counseling time on subjects discussed in assessment and plan sections is over 50% of today's 25 minute visit  Zackari Ruane 04/12/2017, 8:59 PM   Note: This office note was prepared with Dragon voice recognition system technology. Any transcriptional errors that result from this process are unintentional.  Addendum: Creatinine 1.58, will start metformin 2 g daily

## 2017-04-17 ENCOUNTER — Telehealth: Payer: Self-pay | Admitting: Endocrinology

## 2017-04-17 ENCOUNTER — Other Ambulatory Visit: Payer: Self-pay

## 2017-04-17 NOTE — Telephone Encounter (Signed)
MEDICATION: Insulin Glulisine (Apidra solostar) 100 unit/ml sopn  PHARMACY: Patient Assistance (you fax Patient Assistance and they mail script to our office)   IS THIS A 90 DAY SUPPLY: yes(might be more)   IS PATIENT OUT OF MEDICTAION: yes  IF NOT; HOW MUCH IS LEFT:   LAST APPOINTMENT DATE: 08/08  NEXT APPOINTMENT DATE:  11/08  OTHER COMMENTS: Patient would like a call back from [facility]   **Let patient know to contact pharmacy at the end of the day to make sure medication is ready. **  ** Please notify patient to allow 48-72 hours to process**  **Encourage patient to contact the pharmacy for refills or they can request refills through [facility]**

## 2017-04-17 NOTE — Telephone Encounter (Signed)
Called patient and left a voice message to please call me back with the Patient Assistance program fax number so I can have it in the notes to send in his prescription. I have looked back in previous notes and I do not see it. I will also call him back tomorrow.

## 2017-04-18 ENCOUNTER — Telehealth: Payer: Self-pay | Admitting: Oncology

## 2017-04-18 NOTE — Telephone Encounter (Signed)
Faxed records to consumer attorney records services [phone]

## 2017-04-20 ENCOUNTER — Other Ambulatory Visit: Payer: Self-pay

## 2017-04-20 MED ORDER — INSULIN GLULISINE 100 UNIT/ML SOLOSTAR PEN: 20.0000 [IU] | PEN_INJECTOR | Freq: Two times a day (BID) | SUBCUTANEOUS | 2 refills | Status: DC

## 2017-04-20 NOTE — Telephone Encounter (Signed)
Called patient and I let him know that I faxed this prescription to the Assist Patient Program for him.

## 2017-04-20 NOTE — Telephone Encounter (Signed)
Fax number for patient assistance is [phone]. Pleas send.  Thanks!

## 2017-04-21 DIAGNOSIS — F6381 Intermittent explosive disorder: Secondary | ICD-10-CM | POA: Diagnosis not present

## 2017-05-26 ENCOUNTER — Other Ambulatory Visit: Payer: Self-pay

## 2017-05-26 ENCOUNTER — Telehealth: Payer: Self-pay | Admitting: Endocrinology

## 2017-05-26 MED ORDER — INSULIN GLARGINE 300 UNIT/ML ~~LOC~~ SOPN: 50.0000 [IU] | PEN_INJECTOR | Freq: Every day | SUBCUTANEOUS | 2 refills | Status: DC

## 2017-05-26 MED ORDER — INSULIN GLULISINE 100 UNIT/ML SOLOSTAR PEN: 20.0000 [IU] | PEN_INJECTOR | Freq: Two times a day (BID) | SUBCUTANEOUS | 2 refills | Status: DC

## 2017-05-26 NOTE — Telephone Encounter (Signed)
Called patient and left a voice message to let him know that I have faxed over both of these prescriptions for him to the fax number that was provided a while back for the patient assistance program.

## 2017-05-26 NOTE — Telephone Encounter (Signed)
MEDICATION: Insulin Glargine (TOUJEO SOLOSTAR) 300 UNIT/ML SOPN Insulin Glulisine (APIDRA SOLOSTAR) 100 UNIT/ML Sol[facility] Pen  PHARMACY:  [facility]  IS THIS A 90 DAY SUPPLY : N/A  IS PATIENT OUT OF MEDICATION: N  IF NOT; HOW MUCH IS LEFT: 2.5 pens left   LAST APPOINTMENT DATE: 04/12/17  NEXT APPOINTMENT DATE: 07/13/17  OTHER COMMENTS: Patient stated he is on patient assistance program and does not have any info to get this sent to [facility]. Please contact patient with any questions. OK to leave message.    **Let patient know to contact pharmacy at the end of the day to make sure medication is ready. **  ** Please notify patient to allow 48-72 hours to process**  **Encourage patient to contact the pharmacy for refills or they can request refills through [facility]**

## 2017-06-16 ENCOUNTER — Telehealth: Payer: Self-pay | Admitting: Endocrinology

## 2017-06-16 NOTE — Telephone Encounter (Signed)
Pt is almost out of his toujeo and apidra  This is thru patient assistance

## 2017-06-19 NOTE — Telephone Encounter (Signed)
Patient is caliing on the status of his medication and when is it ready to be picked up??  The apidra and toujeo  Please advise

## 2017-06-21 ENCOUNTER — Other Ambulatory Visit: Payer: Self-pay

## 2017-06-21 ENCOUNTER — Telehealth: Payer: Self-pay | Admitting: Endocrinology

## 2017-06-21 MED ORDER — INSULIN GLARGINE 300 UNIT/ML ~~LOC~~ SOPN: 50.0000 [IU] | PEN_INJECTOR | Freq: Every day | SUBCUTANEOUS | 2 refills | Status: DC

## 2017-06-21 MED ORDER — INSULIN GLULISINE 100 UNIT/ML SOLOSTAR PEN: 20.0000 [IU] | PEN_INJECTOR | Freq: Two times a day (BID) | SUBCUTANEOUS | 2 refills | Status: AC

## 2017-06-21 NOTE — Telephone Encounter (Signed)
Tammy from [employer] need a letter with this patient information to go forward. Fax # [phone]

## 2017-06-21 NOTE — Telephone Encounter (Signed)
Called patient and let him know that I have faxed over his prescriptions to the patient assistance program and he stated that he is currently out of insulin so I am leaving Humalog and Toujeo in the refrigerator for him to pick up tomorrow until he can get his regular supplies.

## 2017-06-22 NOTE — Telephone Encounter (Signed)
Pt signed medical records release for [name] to send records to this company. It has been faxed to [employer]

## 2017-06-22 NOTE — Telephone Encounter (Signed)
Spoke to the patient and let him know we will not fax records without a signed release from the patient and he stated an understanding

## 2017-06-30 ENCOUNTER — Telehealth: Payer: Self-pay

## 2017-06-30 NOTE — Telephone Encounter (Signed)
If he is not having any insulin currently he will need to go to [facility] and use the [employer] brand NPH and regular insulin with a syringe.  NPH insulin will be half the Toujeo dose twice a day

## 2017-06-30 NOTE — Telephone Encounter (Signed)
Left voice mail to call back 

## 2017-06-30 NOTE — Telephone Encounter (Signed)
Left voice mail to call back it is time for patient to apply for [facility] Patient Assistance Progam Call [phone] or visit [email or web address]

## 2017-06-30 NOTE — Telephone Encounter (Addendum)
Spoke with patient advised he was due to re-apply for patient assistance .  He stated that he had not received any medication from patient assistance.   I gave him phone number for patient assistance for [employer] and printed out patient assistance application.   He stated that he had been out of Toujeo and and Apidra except for samples that office had been provided him .    Next office visit 07/10/17

## 2017-07-10 ENCOUNTER — Other Ambulatory Visit: Payer: Medicare Other

## 2017-07-11 ENCOUNTER — Other Ambulatory Visit (INDEPENDENT_AMBULATORY_CARE_PROVIDER_SITE_OTHER): Payer: Medicare Other

## 2017-07-11 DIAGNOSIS — Z794 Long term (current) use of insulin: Secondary | ICD-10-CM

## 2017-07-11 DIAGNOSIS — E1165 Type 2 diabetes mellitus with hyperglycemia: Secondary | ICD-10-CM

## 2017-07-11 LAB — COMPREHENSIVE METABOLIC PANEL
ALBUMIN: 4.3 g/dL (ref 3.5–5.2)
ALK PHOS: 56 U/L (ref 39–117)
ALT: 12 U/L (ref 0–53)
AST: 14 U/L (ref 0–37)
BILIRUBIN TOTAL: 0.5 mg/dL (ref 0.2–1.2)
BUN: 13 mg/dL (ref 6–23)
CALCIUM: 10.1 mg/dL (ref 8.4–10.5)
CO2: 28 meq/L (ref 19–32)
CREATININE: 1.33 mg/dL (ref 0.40–1.50)
Chloride: 103 mEq/L (ref 96–112)
GFR: 72.42 mL/min (ref >60.00)
Glucose, Bld: 139 mg/dL — ABNORMAL HIGH (ref 70–99)
Potassium: 4.8 mEq/L (ref 3.5–5.1)
Sodium: 136 mEq/L (ref 135–145)
Total Protein: 6.7 g/dL (ref 6.0–8.3)

## 2017-07-11 LAB — LIPID PANEL
CHOL/HDL RATIO: 6
Cholesterol: 206 mg/dL — ABNORMAL HIGH (ref 0–200)
HDL: 34.7 mg/dL — AB (ref >39.00)
LDL Cholesterol: 146 mg/dL — ABNORMAL HIGH (ref 0–99)
NonHDL: 171.73
TRIGLYCERIDES: 127 mg/dL (ref 0.0–149.0)
VLDL: 25.4 mg/dL (ref 0.0–40.0)

## 2017-07-11 LAB — HEMOGLOBIN A1C: Hgb A1c MFr Bld: 7.2 % — ABNORMAL HIGH (ref 4.6–6.5)

## 2017-07-13 ENCOUNTER — Encounter: Payer: Self-pay | Admitting: Endocrinology

## 2017-07-13 ENCOUNTER — Telehealth: Payer: Self-pay | Admitting: Endocrinology

## 2017-07-13 ENCOUNTER — Ambulatory Visit (INDEPENDENT_AMBULATORY_CARE_PROVIDER_SITE_OTHER): Payer: Medicare Other | Admitting: Endocrinology

## 2017-07-13 VITALS — BP 138/88 | HR 73 | Wt 308.0 lb

## 2017-07-13 DIAGNOSIS — E1165 Type 2 diabetes mellitus with hyperglycemia: Secondary | ICD-10-CM | POA: Diagnosis not present

## 2017-07-13 DIAGNOSIS — E782 Mixed hyperlipidemia: Secondary | ICD-10-CM

## 2017-07-13 DIAGNOSIS — I10 Essential (primary) hypertension: Secondary | ICD-10-CM

## 2017-07-13 DIAGNOSIS — Z794 Long term (current) use of insulin: Secondary | ICD-10-CM | POA: Diagnosis not present

## 2017-07-13 DIAGNOSIS — Z23 Encounter for immunization: Secondary | ICD-10-CM | POA: Diagnosis not present

## 2017-07-13 NOTE — Telephone Encounter (Signed)
Patient stated he talked with you, about an application patient assistance form. something sent in wrong. He asked you to call him.

## 2017-07-13 NOTE — Progress Notes (Signed)
Patient ID: Edward Clarke, male   DOB: 08-25-1966, 51 y.o.   MRN: [id number]   Reason for Appointment: Diabetes follow-up   History of Present Illness    Diagnosis: Type 2 diabetes mellitus, date of diagnosis: 2005.   He has been on basal bolus insulin regimen in the last few years along with metformin Most of the difficulty with control is related to his noncompliance with diet, medications, glucose monitoring and financial issues Frequently will had been running out of one or the other medication or insulin  and blood sugars would not be well controlled  Recent history:   INSULIN: Toujeo units 50 units once daily ; Apidra insulin 0 units before meals  He has not been able to get his insulin supplies through the patient assistance program consistently    His A1c on the last visit was 6.8 and now 7.2  Current management, blood sugar patterns and problems:  Again he did not bring his monitor for download  Although he claims that his blood sugars are fairly good after meals his A1c has gone up  He states that he does not take Apidra now because he is afraid he will run out of his Toujeo and will need Apidra at that time  His fasting readings are reportedly high and he has not changed his Toujeo insulin   Lab glucose was 139 fasting as before  He claims his blood sugars are not high after his meals but not clear how often he is checking these  Is still not motivated to exercise  However despite not taking Invokana currently because of lack of patient assistance he has started losing weight  He thinks he is generally trying to watch high fat foods and eating less frequently however occasionally may snack during the night including last night causing his morning sugar to be 180  Monitors blood glucose: About 1-2 times a day Glucometer: One Touch Verio.   Blood Glucose readings by recall  Am 140 av PC 150  Am blood sugar range 87-1 69 with median about 129, late  afternoon 125, 154  HYPOGLYCEMIA:  None Treatment of hypoglycemia: Usually coffee creamer or food.  Does have glucose tablets at home    Meals: Usually 2 meals a day ( lunch 12-1 PM, supper 9 pm)   Physical activity: exercise: some   Wt Readings from Last 3 Encounters:  07/13/17 (!) 308 lb (139.7 kg)  04/12/17 (!) 311 lb 12.8 oz (141.4 kg)  01/04/17 (!) 319 lb 6.4 oz (144.9 kg)     The HbgA1c has been as high as 11.6 previously .  Lab Results  Component Value Date   HGBA1C 7.2 (H) 07/11/2017   HGBA1C 6.8 04/12/2017   HGBA1C 6.7 (H) 10/10/2016   Lab Results  Component Value Date   MICROALBUR <0.7 10/10/2016   LDLCALC 146 (H) 07/11/2017   CREATININE 1.33 07/11/2017       Allergies as of 07/13/2017      Reactions   Benazepril Other (See Comments)   Reaction unknown - may have been headaches      Medication List        Accurate as of 07/13/17 12:55 PM. Always use your most recent med list.          aspirin EC 81 MG tablet Take 81 mg by mouth every morning. Reported on 02/18/2016   atorvastatin 10 MG tablet Commonly known as:  LIPITOR Take 1 tablet (10 mg total) by mouth daily.  glucose blood test strip Commonly known as:  ONETOUCH VERIO USE ONE STRIP TO CHECK GLUCOSE 4 TIMES DAILY Dx code E11.65   ibuprofen 200 MG tablet Commonly known as:  ADVIL,MOTRIN Take 400-800 mg by mouth every 8 (eight) hours as needed for headache. Reported on 02/18/2016   Insulin Glargine 300 UNIT/ML Sopn Commonly known as:  TOUJEO SOLOSTAR Inject 50 Units into the skin daily.   Insulin Glulisine 100 UNIT/ML Solostar Pen Commonly known as:  APIDRA SOLOSTAR Inject 20 Units into the skin 2 (two) times daily.   Insulin Pen Needle 31G X 5 MM Misc Use to inject insulin once daily   lisinopril 20 MG tablet Commonly known as:  PRINIVIL,ZESTRIL Take 1 tablet (20 mg total) by mouth daily.   metFORMIN 1000 MG tablet Commonly known as:  GLUCOPHAGE Take 1 tablet (1,000 mg total)  by mouth 2 (two) times daily with a meal.       Allergies:  Allergies  Allergen Reactions  . Benazepril Other (See Comments)    Reaction unknown - may have been headaches    Past Medical History:  Diagnosis Date  . Diabetes mellitus   . Hyperlipidemia   . Hypertension   . Monoclonal paraproteinemia   . Obesity     Past Surgical History:  Procedure Laterality Date  . APPENDECTOMY    . EYE SURGERY    . TONSILLECTOMY      Family History  Problem Relation Age of Onset  . Diabetes Sister     Social History:  reports that he has quit smoking. His smoking use included cigarettes. he has never used smokeless tobacco. He reports that he drinks about 1.2 oz of alcohol per week. He reports that he uses drugs. Drug: Marijuana. Frequency: 7.00 times per week.  Review of Systems:   HYPERTENSION:  has had long-standing hypertension and blood pressure is being treated with lisinopril and blood pressure Is fairly good.   Blood pressure does not appear to have increased with stopping Invokana  HYPERLIPIDEMIA:  The lipid abnormality consists of elevated LDL treated with  LDL is higher as expected  Lab Results  Component Value Date   CHOL 206 (H) 07/11/2017   HDL 34.70 (L) 07/11/2017   LDLCALC 146 (H) 07/11/2017   LDLDIRECT 174.0 05/27/2016   TRIG 127.0 07/11/2017   CHOLHDL 6 07/11/2017   Renal dysfunction: His creatinine was 1.58 on the last visit and now improved    Examination:   BP 138/88 (BP Location: Left Arm, Patient Position: Sitting)   Pulse 73   Wt (!) 308 lb (139.7 kg)   SpO2 95%   BMI 39.54 kg/m   Body mass index is 39.54 kg/m.      Assesment/Plan:   1. Diabetes type 2,  with obesity  See history of present illness for current blood sugar patterns, management  and problems identified  His A1c is 7.2 However not clear what his blood sugar patterns are as he did not bring his monitor Currently with only taking metformin and basal insulin if blood  sugars are still reasonably controlled but most likely must be having high postprandial readings which does not monitor regularly; today glucose was high 1 80 at home after eating pasta He does appear to be benefiting from continuing to improve diet and losing weight  RECOMMENDATIONS:  Encouraged him to start consistent exercise using exercise bike.  He needs to start checking blood sugar readings more regularly after meals  He may need to increase Toujeo  if fasting blood sugars continue to stay high  He will bring his monitor for download tomorrow and will review his blood sugars  Discussed blood sugar targets and to avoid high carbohydrate unbalanced meals    2. HYPERTENSION: Blood pressure is higher today from his running out of lisinopril Discussed need to take this consistently   3. Hyperlipidemia: Still not controlled.   Will try Lipitor 20 mg instead of 10 mg to get his lipids to target and emphasized the need to take this regularly as he does not always refill his prescription on time Most likely since he is trying to eat lower fat foods and lose weight his diet is adequate  4.  Preventive care: Emphasized the need to see his ophthalmologist for follow-up as he has not been regular with this and is at high-risk for retinopathy and macular edema  Patient Instructions  Get eye exam, [facility]  Retina Dr. Baird [facility]  Exercise Bike daily  Take 6-10 Apidra at meals  Counseling time on subjects discussed in assessment and plan sections is over 50% of today's 25 minute visit  Gazelle Towe 07/13/2017, 12:55 PM   Note: This office note was prepared with Dragon voice recognition system technology. Any transcriptional errors that result from this process are unintentional.

## 2017-07-13 NOTE — Patient Instructions (Addendum)
Get eye exam, [facility]  Retina Dr. Baird Cancer  Exercise Bike daily  Take 6-10 Apidra at meals

## 2017-07-14 NOTE — Telephone Encounter (Signed)
Did you work on this when you were rooming for Dwyane Dee I did not see this

## 2017-07-18 NOTE — Telephone Encounter (Signed)
I don't recall his name being familiar. I see where Cyril Mourning had given him the number for [employer] for patient assistance, we would normally received it back from the patient assistance if something was not done correctly. We may need to call and see if they could send it back to [name] so we can fix the issue.

## 2017-07-19 ENCOUNTER — Other Ambulatory Visit: Payer: Self-pay | Admitting: Oncology

## 2017-07-19 ENCOUNTER — Telehealth: Payer: Self-pay | Admitting: *Deleted

## 2017-07-19 DIAGNOSIS — D472 Monoclonal gammopathy: Secondary | ICD-10-CM

## 2017-07-19 NOTE — Telephone Encounter (Signed)
Received voice mail message from patient stating,"I think it's time to have an appointment with  Dr. Alen Blew. (Last appointment was 01/14/15). Also, I have paperwork to be filled out. My return number is [phone]."

## 2017-07-19 NOTE — Telephone Encounter (Signed)
Please send a message to scheduling. Lab + MD in 2 months.

## 2017-07-22 ENCOUNTER — Other Ambulatory Visit: Payer: Self-pay | Admitting: Endocrinology

## 2017-07-25 ENCOUNTER — Telehealth: Payer: Self-pay | Admitting: Oncology

## 2017-07-25 ENCOUNTER — Telehealth: Payer: Self-pay | Admitting: Endocrinology

## 2017-07-25 NOTE — Telephone Encounter (Signed)
Patient requests you call him at ph# [phone] to discuss future blood work AND He needs Lantis-he ran out on Friday and his numbers have tripled since

## 2017-07-25 NOTE — Telephone Encounter (Signed)
I called the patient and left a voice message to make sure he received his Toujeo when he came into the office today and to make sure his question was answered about his blood work.

## 2017-07-25 NOTE — Telephone Encounter (Signed)
Left voicemail for patient regarding appt added per 11/16 sch msg.

## 2017-08-01 NOTE — Telephone Encounter (Signed)
Form on Dr. Ronnie Derby desk awaiting signature

## 2017-08-02 NOTE — Telephone Encounter (Signed)
This was faxed yesterday

## 2017-08-03 NOTE — Telephone Encounter (Signed)
Spoke with [employer] and they need prescription for lantus but I do not see this in his chart please advise

## 2017-08-03 NOTE — Telephone Encounter (Signed)
He is supposed to be taking Toujeo but they can be interchangeable

## 2017-08-07 ENCOUNTER — Telehealth: Payer: Self-pay | Admitting: Endocrinology

## 2017-08-07 NOTE — Telephone Encounter (Signed)
This has been done and refaxed

## 2017-08-07 NOTE — Telephone Encounter (Signed)
APIDRA application was received with the ICD10 code missing. Please send new applicaton with this code on it for processing. Call with questions [phone].

## 2017-08-07 NOTE — Telephone Encounter (Signed)
Please see message. °

## 2017-08-07 NOTE — Telephone Encounter (Signed)
Please fax ICD10 code for Edward Clarke (LANTIS also needs dosage and frequency). Fax# [phone]

## 2017-08-18 NOTE — Telephone Encounter (Signed)
Please see message. Not sure if you have sent again?  Thank you!

## 2017-08-18 NOTE — Telephone Encounter (Signed)
Application sent into [employer]  Pt stated One of the medication sent on it was sent wrong.    Please advise.

## 2017-08-21 NOTE — Telephone Encounter (Signed)
This has been resolved- Toujeo paperwork sent to [employer]

## 2017-09-14 ENCOUNTER — Telehealth: Payer: Self-pay | Admitting: Endocrinology

## 2017-09-18 NOTE — Telephone Encounter (Signed)
This patient was approved for Apidra through [place] thru 08/09/18

## 2017-09-19 ENCOUNTER — Other Ambulatory Visit: Payer: Medicare Other

## 2017-09-20 ENCOUNTER — Other Ambulatory Visit (INDEPENDENT_AMBULATORY_CARE_PROVIDER_SITE_OTHER): Payer: Medicare Other

## 2017-09-20 DIAGNOSIS — Z794 Long term (current) use of insulin: Secondary | ICD-10-CM | POA: Diagnosis not present

## 2017-09-20 DIAGNOSIS — E1165 Type 2 diabetes mellitus with hyperglycemia: Secondary | ICD-10-CM | POA: Diagnosis not present

## 2017-09-20 LAB — COMPREHENSIVE METABOLIC PANEL
ALBUMIN: 4.4 g/dL (ref 3.5–5.2)
ALK PHOS: 52 U/L (ref 39–117)
ALT: 11 U/L (ref 0–53)
AST: 14 U/L (ref 0–37)
BUN: 23 mg/dL (ref 6–23)
CO2: 28 mEq/L (ref 19–32)
Calcium: 9.3 mg/dL (ref 8.4–10.5)
Chloride: 103 mEq/L (ref 96–112)
Creatinine, Ser: 1.42 mg/dL (ref 0.40–1.50)
GFR: 67.1 mL/min (ref >60.00)
GLUCOSE: 169 mg/dL — AB (ref 70–99)
Potassium: 4.8 mEq/L (ref 3.5–5.1)
SODIUM: 138 meq/L (ref 135–145)
TOTAL PROTEIN: 6.8 g/dL (ref 6.0–8.3)
Total Bilirubin: 0.4 mg/dL (ref 0.2–1.2)

## 2017-09-20 LAB — MICROALBUMIN / CREATININE URINE RATIO
Creatinine,U: 112.2 mg/dL
MICROALB/CREAT RATIO: 0.6 mg/g (ref 0.0–30.0)

## 2017-09-20 LAB — LIPID PANEL
CHOLESTEROL: 235 mg/dL — AB (ref 0–200)
HDL: 40 mg/dL (ref >39.00)
LDL Cholesterol: 162 mg/dL — ABNORMAL HIGH (ref 0–99)
NonHDL: 194.51
Total CHOL/HDL Ratio: 6
Triglycerides: 163 mg/dL — ABNORMAL HIGH (ref 0.0–149.0)
VLDL: 32.6 mg/dL (ref 0.0–40.0)

## 2017-09-21 LAB — FRUCTOSAMINE: Fructosamine: 317 umol/L — ABNORMAL HIGH (ref 0–285)

## 2017-09-22 ENCOUNTER — Ambulatory Visit (INDEPENDENT_AMBULATORY_CARE_PROVIDER_SITE_OTHER): Payer: Medicare Other | Admitting: Endocrinology

## 2017-09-22 ENCOUNTER — Encounter: Payer: Self-pay | Admitting: Endocrinology

## 2017-09-22 VITALS — BP 108/74 | HR 62 | Ht 74.0 in | Wt 308.0 lb

## 2017-09-22 DIAGNOSIS — E1165 Type 2 diabetes mellitus with hyperglycemia: Secondary | ICD-10-CM

## 2017-09-22 DIAGNOSIS — Z794 Long term (current) use of insulin: Secondary | ICD-10-CM

## 2017-09-22 NOTE — Patient Instructions (Addendum)
Exercise daily  Refill atorvastatin  see ophthalmologist for follow-up

## 2017-09-22 NOTE — Progress Notes (Addendum)
Patient ID: Edward Clarke, male   DOB: [birth year], 52 y.o.   MRN: [id number]   Reason for Appointment: Diabetes follow-up   History of Present Illness    Diagnosis: Type 2 diabetes mellitus, date of diagnosis: 2005.   He has been on basal bolus insulin regimen in the last few years along with metformin Most of the difficulty with control is related to his noncompliance with diet, medications, glucose monitoring and financial issues Frequently will had been running out of one or the other medication or insulin  and blood sugars would not be well controlled  Recent history:   INSULIN: Toujeo units 50 units once daily ; Apidra insulin 0 units before meals  He has not been able to get his insulin supplies through the patient assistance program consistently, currently does not have Apidra    His A1c was last 7.2 Fructosamine was high at 317  Current management, blood sugar patterns and problems:  He states that he does not take Apidra still because of lack of patient assistance supply  Despite this his blood sugars are only around 200 postprandial but not consistently  Difficult to assess his patterns as he checks his blood sugars mostly in the morning and only 0.6 times a day  Still does not like to exercise despite repeated reminders  Also his weight has improved  He is somewhat inconsistent with getting his prescription refill especially if he has a co-pay    Glucometer: One Probation [employer].   Blood Glucose readings by download analysis:  Mean values apply above for all meters except median for One Touch  PRE-MEAL Fasting Lunch Dinner Bedtime Overall  Glucose range: 87-191     127-230   Mean/median: 135     155    Am blood sugar range 87-1 69 with median about 129, late afternoon 125, 154  HYPOGLYCEMIA:  None Treatment of hypoglycemia: Usually coffee creamer or food.  Does have glucose tablets at home    Meals: Usually 2 meals a day ( lunch 12-1 PM, supper 9  pm)   Physical activity: exercise: some   Wt Readings from Last 3 Encounters:  09/22/17 (!) 308 lb (139.7 kg)  07/13/17 (!) 308 lb (139.7 kg)  04/12/17 (!) 311 lb 12.8 oz (141.4 kg)     The HbgA1c has been as high as 11.6 previously .  Lab Results  Component Value Date   HGBA1C 7.2 (H) 07/11/2017   HGBA1C 6.8 04/12/2017   HGBA1C 6.7 (H) 10/10/2016   Lab Results  Component Value Date   MICROALBUR <0.7 09/20/2017   LDLCALC 162 (H) 09/20/2017   CREATININE 1.42 09/20/2017    Lab Results  Component Value Date   FRUCTOSAMINE 317 (H) 09/20/2017   FRUCTOSAMINE 337 (H) 01/02/2017   FRUCTOSAMINE 307 (H) 11/13/2014      Allergies as of 09/22/2017      Reactions   Benazepril Other (See Comments)   Reaction unknown - may have been headaches      Medication List        Accurate as of 09/22/17 11:59 PM. Always use your most recent med list.          aspirin EC 81 MG tablet Take 81 mg by mouth every morning. Reported on 02/18/2016   atorvastatin 10 MG tablet Commonly known as:  LIPITOR Take 1 tablet (10 mg total) by mouth daily.   glucose blood test strip Commonly known as:  ONETOUCH VERIO USE ONE STRIP TO CHECK GLUCOSE  4 TIMES DAILY Dx code E11.65   ibuprofen 200 MG tablet Commonly known as:  ADVIL,MOTRIN Take 400-800 mg by mouth every 8 (eight) hours as needed for headache. Reported on 02/18/2016   Insulin Glargine 300 UNIT/ML Sopn Commonly known as:  TOUJEO SOLOSTAR Inject 50 Units into the skin daily.   Insulin Glulisine 100 UNIT/ML Solostar Pen Commonly known as:  APIDRA SOLOSTAR Inject 20 Units into the skin 2 (two) times daily.   Insulin Pen Needle 31G X 5 MM Misc Use to inject insulin once daily   lisinopril 20 MG tablet Commonly known as:  PRINIVIL,ZESTRIL TAKE 1 TABLET BY MOUTH ONCE DAILY   metFORMIN 1000 MG tablet Commonly known as:  GLUCOPHAGE Take 1 tablet (1,000 mg total) by mouth 2 (two) times daily with a meal.       Allergies:    Allergies  Allergen Reactions  . Benazepril Other (See Comments)    Reaction unknown - may have been headaches    Past Medical History:  Diagnosis Date  . Diabetes mellitus   . Hyperlipidemia   . Hypertension   . Monoclonal paraproteinemia   . Obesity     Past Surgical History:  Procedure Laterality Date  . APPENDECTOMY    . EYE SURGERY    . TONSILLECTOMY      Family History  Problem Relation Age of Onset  . Diabetes Sister     Social History:  reports that he has quit smoking. His smoking use included cigarettes. he has never used smokeless tobacco. He reports that he drinks about 1.2 oz of alcohol per week. He reports that he uses drugs. Drug: Marijuana. Frequency: 7.00 times per week.  Review of Systems:   HYPERTENSION:  has had long-standing hypertension and blood pressure is being treated with lisinopril and recent blood pressure Is fairly good.     HYPERLIPIDEMIA:  The lipid abnormality consists of elevated LDL treated with Lipitor He is again irregular with getting his refills  LDL is higher as expected  Lab Results  Component Value Date   CHOL 235 (H) 09/20/2017   HDL 40.00 09/20/2017   LDLCALC 162 (H) 09/20/2017   LDLDIRECT 174.0 05/27/2016   TRIG 163.0 (H) 09/20/2017   CHOLHDL 6 09/20/2017   Renal dysfunction: His creatinine was 1.58 in 2018 but now improved   Lab Results  Component Value Date   CREATININE 1.42 09/20/2017   CREATININE 1.33 07/11/2017   CREATININE 1.58 (H) 04/12/2017    Still has not gone for his eye exam because of cost    Examination:   BP 108/74   Pulse 62   Ht 6\' 2"  (1.88 m)   Wt (!) 308 lb (139.7 kg)   SpO2 97%   BMI 39.54 kg/m   Body mass index is 39.54 kg/m.      Assesment/Plan:   1. Diabetes type 2,  with morbid obesity  See history of present illness for current blood sugar patterns, management  and problems identified  He is on insulin and metformin Previously had benefited from [place] but cannot  afford brand name medications Although his blood sugars are reasonably good overall in the morning  He has high post dinner readings from not taking Apidra He also has not been exercising or being consistent with his diet  RECOMMENDATIONS:  He needs to start checking his blood sugars more consistently at different times  Encouraged him to be consistent with exercise also even if he does jumping jacks or exercise bike  He will look into getting his Apidra from the patient assistance program  Will also apply for patient assistance for Invokana as this will help him with control and weight loss also    2. HYPERTENSION: Blood pressure is controlled when he takes his lisinopril regimen regularly Microalbumin is again negative  3. Hyperlipidemia: Still not controlled, also sometimes irregular with his Lipitor refills.    Will try Lipitor 20 mg instead of 10 mg to get his lipids to target     4.  Preventive care: He will be referred for an eye exam    Patient Instructions  Exercise daily  Refill atorvastatin  see ophthalmologist for follow-up        Elayne Snare 09/24/2017, 11:29 AM   Note: This office note was prepared with Dragon voice recognition system technology. Any transcriptional errors that result from this process are unintentional.

## 2017-09-24 MED ORDER — METFORMIN HCL 1000 MG PO TABS: 1000.0000 mg | ORAL_TABLET | Freq: Two times a day (BID) | ORAL | 3 refills | Status: DC

## 2017-09-26 ENCOUNTER — Other Ambulatory Visit: Payer: Medicare Other

## 2017-09-26 ENCOUNTER — Inpatient Hospital Stay (HOSPITAL_BASED_OUTPATIENT_CLINIC_OR_DEPARTMENT_OTHER): Payer: Medicare Other | Admitting: Oncology

## 2017-09-26 ENCOUNTER — Telehealth: Payer: Self-pay | Admitting: Endocrinology

## 2017-09-26 ENCOUNTER — Ambulatory Visit: Payer: Medicare Other | Admitting: Oncology

## 2017-09-26 ENCOUNTER — Telehealth: Payer: Self-pay | Admitting: Oncology

## 2017-09-26 ENCOUNTER — Inpatient Hospital Stay: Payer: Medicare Other | Attending: Oncology

## 2017-09-26 VITALS — BP 131/88 | HR 64 | Temp 97.8°F | Resp 18 | Ht 74.0 in | Wt 308.3 lb

## 2017-09-26 DIAGNOSIS — Z8572 Personal history of non-Hodgkin lymphomas: Secondary | ICD-10-CM

## 2017-09-26 DIAGNOSIS — D649 Anemia, unspecified: Secondary | ICD-10-CM | POA: Insufficient documentation

## 2017-09-26 DIAGNOSIS — Z794 Long term (current) use of insulin: Secondary | ICD-10-CM | POA: Diagnosis not present

## 2017-09-26 DIAGNOSIS — Z9221 Personal history of antineoplastic chemotherapy: Secondary | ICD-10-CM | POA: Insufficient documentation

## 2017-09-26 DIAGNOSIS — E119 Type 2 diabetes mellitus without complications: Secondary | ICD-10-CM | POA: Diagnosis not present

## 2017-09-26 DIAGNOSIS — D472 Monoclonal gammopathy: Secondary | ICD-10-CM

## 2017-09-26 DIAGNOSIS — D696 Thrombocytopenia, unspecified: Secondary | ICD-10-CM

## 2017-09-26 DIAGNOSIS — Z7982 Long term (current) use of aspirin: Secondary | ICD-10-CM | POA: Insufficient documentation

## 2017-09-26 DIAGNOSIS — Z79899 Other long term (current) drug therapy: Secondary | ICD-10-CM | POA: Insufficient documentation

## 2017-09-26 LAB — CBC WITH DIFFERENTIAL/PLATELET
BASOS ABS: 0 10*3/uL (ref 0.0–0.1)
Basophils Relative: 1 %
EOS ABS: 0.2 10*3/uL (ref 0.0–0.5)
Eosinophils Relative: 5 %
HCT: 39.9 % (ref 38.4–49.9)
HEMOGLOBIN: 13.8 g/dL (ref 13.0–17.1)
LYMPHS PCT: 36 %
Lymphs Abs: 1.4 10*3/uL (ref 0.9–3.3)
MCH: 29.5 pg (ref 27.2–33.4)
MCHC: 34.6 g/dL (ref 32.0–36.0)
MCV: 85.3 fL (ref 79.3–98.0)
Monocytes Absolute: 0.3 10*3/uL (ref 0.1–0.9)
Monocytes Relative: 8 %
NEUTROS PCT: 50 %
Neutro Abs: 2.1 10*3/uL (ref 1.5–6.5)
PLATELETS: 1 10*3/uL — AB (ref 140–400)
RBC: 4.68 MIL/uL (ref 4.20–5.82)
RDW: 13.6 % (ref 11.0–15.6)
WBC: 4 10*3/uL (ref 4.0–10.3)

## 2017-09-26 LAB — COMPREHENSIVE METABOLIC PANEL
ALT: 15 U/L (ref 0–55)
AST: 17 U/L (ref 5–34)
Albumin: 4.3 g/dL (ref 3.5–5.0)
Alkaline Phosphatase: 58 U/L (ref 40–150)
Anion gap: 7 (ref 3–11)
BILIRUBIN TOTAL: 0.5 mg/dL (ref 0.2–1.2)
BUN: 14 mg/dL (ref 7–26)
CHLORIDE: 104 mmol/L (ref 98–109)
CO2: 26 mmol/L (ref 22–29)
CREATININE: 1.53 mg/dL — AB (ref 0.70–1.30)
Calcium: 10.1 mg/dL (ref 8.4–10.4)
GFR, EST AFRICAN AMERICAN: 59 mL/min — AB (ref >60)
GFR, EST NON AFRICAN AMERICAN: 51 mL/min — AB (ref >60)
Glucose, Bld: 155 mg/dL — ABNORMAL HIGH (ref 70–140)
POTASSIUM: 5 mmol/L (ref 3.5–5.1)
Sodium: 137 mmol/L (ref 136–145)
TOTAL PROTEIN: 6.9 g/dL (ref 6.4–8.3)

## 2017-09-26 MED ORDER — PREDNISONE 20 MG PO TABS: ORAL_TABLET | ORAL | 1 refills | Status: DC

## 2017-09-26 NOTE — Progress Notes (Signed)
Hematology and Oncology Follow Up Visit  Edward Clarke [id number] [birth year] 52 y.o. 09/26/2017 3:39 PM Patient, No Pcp PerNo ref. provider found   Principle Diagnosis: 52 year old gentleman with:  1. Lymphoplasmacytic lymphoma agonist in 2014.  He had presented with IgM about 3 g/dL and M-spike about 2 g/dL.  His workup that includes imaging studies, CT scan of the chest, abdomen and pelvis and skeletal survey had been really unrevealing. Bone marrow biopsy confirmed the diagnosis.   2.  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  Previous therapy: Chemotherapy with Bendamustine and Rituxan on 02/14/13. He is S/P cycle 6 on 07/04/2013 with CR.   Current therapy: Observation and surveillance.  Interim History:  Edward Clarke is here for a follow-up with his wife.  He has not been seen here since 2016 but wanted reestablish care.  Since his last visit, he reported feeling well without any recent complaints.  He did notice a dark bruise on the inner aspect of his right arm while he was attempting to carry a speaker.  He did have some easy bruising after a phlebotomy but no active bleeding.  He denies any hematochezia or melena.  He denies any hemoptysis.  His activity level, performance status remained within normal range.  He denies any any neurological deficits or change in his mentation.  His diabetes has been under reasonable control.  He does not report any headaches, blurry vision, syncope or seizures. Does not report any fevers, chills or sweats.  Does not report any cough, wheezing or hemoptysis.  Does not report any chest pain, palpitation, orthopnea or leg edema.  Does not report any nausea, vomiting or abdominal pain.  She does not report any constipation or diarrhea.  Does not report any skeletal complaints.    Does not report frequency, urgency or hematuria.  Does not report any skin rashes or lesions. Does not report any heat or cold intolerance.  Does not report any lymphadenopathy or  petechiae.  Does not report any anxiety or depression.  Remaining review of systems is negative.  .  Medications: I have reviewed the patient's current medications.  Current Outpatient Medications  Medication Sig Dispense Refill  . aspirin EC 81 MG tablet Take 81 mg by mouth every morning. Reported on 02/18/2016    . atorvastatin (LIPITOR) 10 MG tablet Take 1 tablet (10 mg total) by mouth daily. 30 tablet 3  . glucose blood (ONETOUCH VERIO) test strip USE ONE STRIP TO CHECK GLUCOSE 4 TIMES DAILY Dx code E11.65 400 each 0  . ibuprofen (ADVIL,MOTRIN) 200 MG tablet Take 400-800 mg by mouth every 8 (eight) hours as needed for headache. Reported on 02/18/2016    . Insulin Glargine (TOUJEO SOLOSTAR) 300 UNIT/ML SOPN Inject 50 Units into the skin daily. 5 pen 2  . Insulin Glulisine (APIDRA SOLOSTAR) 100 UNIT/ML Solostar Pen Inject 20 Units into the skin 2 (two) times daily. 5 pen 2  . Insulin Pen Needle 31G X 5 MM MISC Use to inject insulin once daily 30 each 3  . lisinopril (PRINIVIL,ZESTRIL) 20 MG tablet TAKE 1 TABLET BY MOUTH ONCE DAILY 90 tablet 3  . metFORMIN (GLUCOPHAGE) 1000 MG tablet Take 1 tablet (1,000 mg total) by mouth 2 (two) times daily with a meal. 60 tablet 3  . predniSONE (DELTASONE) 20 MG tablet Take 4 tablets daily with food. 120 tablet 1   No current facility-administered medications for this visit.      Allergies:  Allergies  Allergen Reactions  . Benazepril Other (See  Comments)    Reaction unknown - may have been headaches    Past Medical History, Surgical history, Social history, and Family History were reviewed and updated.    Physical Exam: Blood pressure 131/88, pulse 64, temperature 97.8 F (36.6 C), temperature source Oral, resp. rate 18, height '6\' 2"'  (1.88 m), weight (!) 308 lb 4.8 oz (139.8 kg), SpO2 100 %. ECOG: 1 General appearance: Well-appearing gentleman appeared without distress. Head: Normocephalic, without obvious abnormality.  Oral mucosa: Without  thrush or ulcers. Eyes: No scleral icterus. Lymph nodes: Cervical, supraclavicular, and axillary nodes normal. Heart:regular rate and rhythm, S1, S2 normal, no murmur, click, rub or gallop Lung:chest clear, no wheezing, rales, normal symmetric air entry Abdomen: soft, non-tender, without masses or organomegaly Skin: 1 large ecchymosis noted on his right arm.  No petechial rash noted. Neurological: Appropriate mental status.  No confusion or weakness noted.   Lab Results: Lab Results  Component Value Date   WBC 4.0 09/26/2017   HGB 13.8 09/26/2017   HCT 39.9 09/26/2017   MCV 85.3 09/26/2017   PLT 1 (LL) 09/26/2017     Chemistry      Component Value Date/Time   NA 137 09/26/2017 1433   NA 139 01/14/2015 1003   K 5.0 09/26/2017 1433   K 4.3 01/14/2015 1003   CL 104 09/26/2017 1433   CL 101 02/14/2013 0758   CO2 26 09/26/2017 1433   CO2 27 01/14/2015 1003   BUN 14 09/26/2017 1433   BUN 8.1 01/14/2015 1003   CREATININE 1.53 (H) 09/26/2017 1433   CREATININE 1.2 01/14/2015 1003      Component Value Date/Time   CALCIUM 10.1 09/26/2017 1433   CALCIUM 9.0 01/14/2015 1003   ALKPHOS 58 09/26/2017 1433   ALKPHOS 80 01/14/2015 1003   AST 17 09/26/2017 1433   AST 19 01/14/2015 1003   ALT 15 09/26/2017 1433   ALT 21 01/14/2015 1003   BILITOT 0.5 09/26/2017 1433   BILITOT 0.44 01/14/2015 1003      Results for Edward Clarke (MRN [id number]) as of 09/26/2017 15:10  Ref. Range 07/08/2014 10:36 01/14/2015 10:03  IgG (Immunoglobin G), Serum Latest Ref Range: 650 - 1,600 mg/dL 710 766  IgA Latest Ref Range: 68 - 379 mg/dL 17 (L) 19 (L)  IgM, Serum Latest Ref Range: 41 - 251 mg/dL 89 73  Total Protein, Serum Electrophoresis Latest Ref Range: 6.1 - 8.1 g/dL 6.1 6.5     Impression and Plan:  52 year old gentleman with the following issues: 1. Lymphoplasmacytic lymphoma diagnosed in 2014.  He is status post 6 cycles of bendamustine and Rituxan completed in October 2014 and achieved  complere response.  He continues to be in remission at this time without any evidence of relapse.  His protein studies in 2016 all within normal range.  His hemoglobin is within normal range at this time with no evidence of endorgan damage.  2. Anemia: His hemoglobin has normalized and have been within normal range since he achieved remission.  3.Thrombocytopenia: This is a new finding noted today.  He presented with a large ecchymosis on his arm and his platelet count today is very low.  He has no active bleeding noted at this time.  His peripheral smear was personally reviewed today and showed profoundly decreased platelet count.  No evidence of platelet clumping.  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  This is elated to his lymphoproliferative disorder and should be treated as such.  Treatment options  would include steroids, IVIG or rituximab.  We will start with 80 mg of prednisone and monitor his platelet counts closely.  If he achieves a remission, we will start prednisone taper after that.  Additional therapy can be recommended if he does not achieve remission.  He was given clear instructions to present to the emergency department if he develops any active bleeding.  4.  Diabetes: We will continue to monitor his blood sugar closely on steroids.  5.  Follow-up: Will be weekly for labs and in 3 weeks for MD follow-up.  25  minutes was spent with the patient face-to-face today.  More than 50% of time was dedicated to patient counseling, education and coordination of the patient's multifaceted care.     Zola Button 1/22/20193:39 PM

## 2017-09-26 NOTE — Telephone Encounter (Signed)
Please call Patient assistance for [employer] at ph# [phone] [facility]) per patient

## 2017-09-26 NOTE — Telephone Encounter (Signed)
Gave patient AVS  W/ Calendar appts

## 2017-09-28 ENCOUNTER — Telehealth: Payer: Self-pay | Admitting: Oncology

## 2017-09-28 LAB — MULTIPLE MYELOMA PANEL, SERUM
ALBUMIN SERPL ELPH-MCNC: 3.8 g/dL (ref 2.9–4.4)
ALBUMIN/GLOB SERPL: 1.5 (ref 0.7–1.7)
ALPHA 1: 0.3 g/dL (ref 0.0–0.4)
Alpha2 Glob SerPl Elph-Mcnc: 0.6 g/dL (ref 0.4–1.0)
B-Globulin SerPl Elph-Mcnc: 1 g/dL (ref 0.7–1.3)
Gamma Glob SerPl Elph-Mcnc: 0.7 g/dL (ref 0.4–1.8)
Globulin, Total: 2.6 g/dL (ref 2.2–3.9)
IGA: 12 mg/dL — AB (ref 90–386)
IGM (IMMUNOGLOBULIN M), SRM: 42 mg/dL (ref 20–172)
IgG (Immunoglobin G), Serum: 750 mg/dL (ref 700–1600)
Total Protein ELP: 6.4 g/dL (ref 6.0–8.5)

## 2017-09-28 NOTE — Telephone Encounter (Signed)
09/28/17 @ 1:14 pm called and spoke with patient informing him his Disability paperwork was complete and ready for pick-up.  It will be held at front desk reception area for patient to pick up and he's aware of this.

## 2017-10-03 ENCOUNTER — Other Ambulatory Visit: Payer: Medicare Other

## 2017-10-03 ENCOUNTER — Telehealth: Payer: Self-pay | Admitting: *Deleted

## 2017-10-03 ENCOUNTER — Ambulatory Visit: Payer: Medicare Other | Admitting: Oncology

## 2017-10-03 ENCOUNTER — Inpatient Hospital Stay: Payer: Medicare Other

## 2017-10-03 DIAGNOSIS — D649 Anemia, unspecified: Secondary | ICD-10-CM | POA: Diagnosis not present

## 2017-10-03 DIAGNOSIS — E119 Type 2 diabetes mellitus without complications: Secondary | ICD-10-CM | POA: Diagnosis not present

## 2017-10-03 DIAGNOSIS — Z9221 Personal history of antineoplastic chemotherapy: Secondary | ICD-10-CM | POA: Diagnosis not present

## 2017-10-03 DIAGNOSIS — Z7982 Long term (current) use of aspirin: Secondary | ICD-10-CM | POA: Diagnosis not present

## 2017-10-03 DIAGNOSIS — D696 Thrombocytopenia, unspecified: Secondary | ICD-10-CM | POA: Diagnosis not present

## 2017-10-03 DIAGNOSIS — Z8572 Personal history of non-Hodgkin lymphomas: Secondary | ICD-10-CM | POA: Diagnosis not present

## 2017-10-03 LAB — CBC WITH DIFFERENTIAL (CANCER CENTER ONLY)
BASOS ABS: 0 10*3/uL (ref 0.0–0.1)
Basophils Relative: 1 %
Eosinophils Absolute: 0 10*3/uL (ref 0.0–0.5)
Eosinophils Relative: 0 %
HEMATOCRIT: 41.8 % (ref 38.4–49.9)
HEMOGLOBIN: 14 g/dL (ref 13.0–17.1)
LYMPHS ABS: 2.8 10*3/uL (ref 0.9–3.3)
LYMPHS PCT: 31 %
MCH: 29.3 pg (ref 27.2–33.4)
MCHC: 33.4 g/dL (ref 32.0–36.0)
MCV: 87.7 fL (ref 79.3–98.0)
Monocytes Absolute: 0.7 10*3/uL (ref 0.1–0.9)
Monocytes Relative: 8 %
NEUTROS ABS: 5.5 10*3/uL (ref 1.5–6.5)
Neutrophils Relative %: 60 %
Platelet Count: 13 10*3/uL — ABNORMAL LOW (ref 140–400)
RBC: 4.77 MIL/uL (ref 4.20–5.82)
RDW: 14.1 % (ref 11.0–15.6)
WBC: 9.1 10*3/uL (ref 4.0–10.3)

## 2017-10-03 NOTE — Telephone Encounter (Signed)
Lm on patient s cell to call me tomorrow after 8:30 am. Will discuss lab results then.

## 2017-10-03 NOTE — Telephone Encounter (Signed)
-----   Message from Wyatt Portela, MD sent at 10/03/2017 12:05 PM EST ----- Please let him know his platelets are improving on schedule. Keep prednisone the same.

## 2017-10-04 ENCOUNTER — Telehealth: Payer: Self-pay | Admitting: *Deleted

## 2017-10-04 NOTE — Telephone Encounter (Signed)
-----   Message from Wyatt Portela, MD sent at 10/03/2017 12:05 PM EST ----- Please let him know his platelets are improving on schedule. Keep prednisone the same.

## 2017-10-04 NOTE — Telephone Encounter (Signed)
Lm for patient to call me Re: labs

## 2017-10-04 NOTE — Telephone Encounter (Signed)
Spoke with patient gave results of last lab

## 2017-10-10 ENCOUNTER — Inpatient Hospital Stay: Payer: Medicare Other | Attending: Oncology

## 2017-10-10 ENCOUNTER — Telehealth: Payer: Self-pay | Admitting: *Deleted

## 2017-10-10 DIAGNOSIS — Z7982 Long term (current) use of aspirin: Secondary | ICD-10-CM | POA: Diagnosis not present

## 2017-10-10 DIAGNOSIS — D693 Immune thrombocytopenic purpura: Secondary | ICD-10-CM | POA: Insufficient documentation

## 2017-10-10 DIAGNOSIS — D696 Thrombocytopenia, unspecified: Secondary | ICD-10-CM

## 2017-10-10 DIAGNOSIS — Z794 Long term (current) use of insulin: Secondary | ICD-10-CM | POA: Diagnosis not present

## 2017-10-10 DIAGNOSIS — E1165 Type 2 diabetes mellitus with hyperglycemia: Secondary | ICD-10-CM | POA: Insufficient documentation

## 2017-10-10 DIAGNOSIS — Z9221 Personal history of antineoplastic chemotherapy: Secondary | ICD-10-CM | POA: Diagnosis not present

## 2017-10-10 DIAGNOSIS — Z79899 Other long term (current) drug therapy: Secondary | ICD-10-CM | POA: Diagnosis not present

## 2017-10-10 DIAGNOSIS — Z8572 Personal history of non-Hodgkin lymphomas: Secondary | ICD-10-CM | POA: Diagnosis not present

## 2017-10-10 LAB — CBC WITH DIFFERENTIAL (CANCER CENTER ONLY)
BASOS ABS: 0 10*3/uL (ref 0.0–0.1)
BASOS PCT: 0 %
EOS ABS: 0 10*3/uL (ref 0.0–0.5)
EOS PCT: 0 %
HCT: 41.1 % (ref 38.4–49.9)
Hemoglobin: 13.7 g/dL (ref 13.0–17.1)
LYMPHS PCT: 33 %
Lymphs Abs: 3.2 10*3/uL (ref 0.9–3.3)
MCH: 29.1 pg (ref 27.2–33.4)
MCHC: 33.2 g/dL (ref 32.0–36.0)
MCV: 87.6 fL (ref 79.3–98.0)
MONO ABS: 0.8 10*3/uL (ref 0.1–0.9)
Monocytes Relative: 8 %
Neutro Abs: 5.6 10*3/uL (ref 1.5–6.5)
Neutrophils Relative %: 59 %
PLATELETS: 31 10*3/uL — AB (ref 140–400)
RBC: 4.69 MIL/uL (ref 4.20–5.82)
RDW: 14.1 % (ref 11.0–14.6)
WBC: 9.6 10*3/uL (ref 4.0–10.3)

## 2017-10-10 NOTE — Telephone Encounter (Signed)
-----   Message from Wyatt Portela, MD sent at 10/10/2017 11:35 AM EST ----- Please let him know his platelets are improving quick. No change in prednisone yet.

## 2017-10-10 NOTE — Telephone Encounter (Signed)
As noted below by Dr. Alen Blew, I informed patient of his platelet count. Also, there is no change in his prednisone dose. He verbalized understanding.

## 2017-10-16 ENCOUNTER — Inpatient Hospital Stay (HOSPITAL_BASED_OUTPATIENT_CLINIC_OR_DEPARTMENT_OTHER): Payer: Medicare Other | Admitting: Oncology

## 2017-10-16 ENCOUNTER — Telehealth: Payer: Self-pay

## 2017-10-16 ENCOUNTER — Inpatient Hospital Stay: Payer: Medicare Other

## 2017-10-16 DIAGNOSIS — Z9221 Personal history of antineoplastic chemotherapy: Secondary | ICD-10-CM | POA: Diagnosis not present

## 2017-10-16 DIAGNOSIS — Z7982 Long term (current) use of aspirin: Secondary | ICD-10-CM

## 2017-10-16 DIAGNOSIS — Z79899 Other long term (current) drug therapy: Secondary | ICD-10-CM

## 2017-10-16 DIAGNOSIS — Z794 Long term (current) use of insulin: Secondary | ICD-10-CM | POA: Diagnosis not present

## 2017-10-16 DIAGNOSIS — Z8572 Personal history of non-Hodgkin lymphomas: Secondary | ICD-10-CM | POA: Diagnosis not present

## 2017-10-16 DIAGNOSIS — D693 Immune thrombocytopenic purpura: Secondary | ICD-10-CM | POA: Insufficient documentation

## 2017-10-16 DIAGNOSIS — E1165 Type 2 diabetes mellitus with hyperglycemia: Secondary | ICD-10-CM | POA: Diagnosis not present

## 2017-10-16 DIAGNOSIS — D696 Thrombocytopenia, unspecified: Secondary | ICD-10-CM

## 2017-10-16 LAB — CBC WITH DIFFERENTIAL (CANCER CENTER ONLY)
BASOS PCT: 0 %
Basophils Absolute: 0 10*3/uL (ref 0.0–0.1)
EOS ABS: 0.1 10*3/uL (ref 0.0–0.5)
EOS PCT: 1 %
HCT: 39.8 % (ref 38.4–49.9)
Hemoglobin: 13.7 g/dL (ref 13.0–17.1)
LYMPHS ABS: 4.9 10*3/uL — AB (ref 0.9–3.3)
Lymphocytes Relative: 40 %
MCH: 29.5 pg (ref 27.2–33.4)
MCHC: 34.4 g/dL (ref 32.0–36.0)
MCV: 85.6 fL (ref 79.3–98.0)
MONOS PCT: 6 %
Monocytes Absolute: 0.7 10*3/uL (ref 0.1–0.9)
Neutro Abs: 6.7 10*3/uL — ABNORMAL HIGH (ref 1.5–6.5)
Neutrophils Relative %: 53 %
PLATELETS: 6 10*3/uL — AB (ref 140–400)
RBC: 4.65 MIL/uL (ref 4.20–5.82)
RDW: 13.8 % (ref 11.0–14.6)
WBC: 12.4 10*3/uL — AB (ref 4.0–10.3)

## 2017-10-16 NOTE — Telephone Encounter (Signed)
Printed avs and calender for upcoming appointment.per 2/11 los

## 2017-10-16 NOTE — Telephone Encounter (Signed)
Can you start this for the patient to make sure it is complete? Thank you!

## 2017-10-16 NOTE — Progress Notes (Signed)
Hematology and Oncology Follow Up Visit  Edward Clarke [id number] Jun 13, 1966 52 y.o. 10/16/2017 10:16 AM Patient, No Pcp PerNo ref. provider found   Principle Diagnosis: 52 year old man with:  1. Lymphoplasmacytic lymphoma agonist in 2014.  His disease currently in remission after he presented with IgM about 3 g/dL and M-spike about 2 g/dL.  Protein studies in January 2019 showed no M spike observed and normal quantitative immunoglobulins.  2.  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  He presented with a platelet count of 1000 on September 26, 2017.  Previous therapy:   Chemotherapy with Bendamustine and Rituxan on 02/14/13. He is S/P cycle 6 on 07/04/2013 with CR.   Current therapy: Prednisone 80 mg daily started on September 26, 2017.  Interim History:  Edward Clarke is here for a follow-up by himself.  Since the last visit, he started prednisone at 80 mg daily and tolerated very well.  He is performance status and activity level remain excellent.  He reported his energy as well as appetite remained reasonable.  His blood sugar slightly elevated but remained manageable.  He denies any active bleeding or easy bruising.  He denies any changes in his mental status.  He reported his activity has increased on prednisone.   He does not report any headaches, blurry vision, syncope or seizures. Does not report any fevers, chills or sweats.  Does not report any cough, wheezing or hemoptysis.  Does not report any chest pain, palpitation, orthopnea or leg edema.  Does not report any nausea, vomiting or abdominal pain.  She does not report any constipation or diarrhea.  Does not report any bone pain or pathological fractures.   Does not report frequency, urgency or hematuria.  Does not report any skin rashes or lesions. Does not report any heat or cold intolerance.  Does not report any lymphadenopathy.  He does report easy bruising.  Does not report any anxiety or depression.  Remaining review of systems is negative.   .  Medications: I have reviewed the patient's current medications.  Current Outpatient Medications  Medication Sig Dispense Refill  . aspirin EC 81 MG tablet Take 81 mg by mouth every morning. Reported on 02/18/2016    . atorvastatin (LIPITOR) 10 MG tablet Take 1 tablet (10 mg total) by mouth daily. 30 tablet 3  . glucose blood (ONETOUCH VERIO) test strip USE ONE STRIP TO CHECK GLUCOSE 4 TIMES DAILY Dx code E11.65 400 each 0  . ibuprofen (ADVIL,MOTRIN) 200 MG tablet Take 400-800 mg by mouth every 8 (eight) hours as needed for headache. Reported on 02/18/2016    . Insulin Glargine (TOUJEO SOLOSTAR) 300 UNIT/ML SOPN Inject 50 Units into the skin daily. 5 pen 2  . Insulin Glulisine (APIDRA SOLOSTAR) 100 UNIT/ML Solostar Pen Inject 20 Units into the skin 2 (two) times daily. 5 pen 2  . Insulin Pen Needle 31G X 5 MM MISC Use to inject insulin once daily 30 each 3  . lisinopril (PRINIVIL,ZESTRIL) 20 MG tablet TAKE 1 TABLET BY MOUTH ONCE DAILY 90 tablet 3  . metFORMIN (GLUCOPHAGE) 1000 MG tablet Take 1 tablet (1,000 mg total) by mouth 2 (two) times daily with a meal. 60 tablet 3  . predniSONE (DELTASONE) 20 MG tablet Take 4 tablets daily with food. 120 tablet 1   No current facility-administered medications for this visit.      Allergies:  Allergies  Allergen Reactions  . Benazepril Other (See Comments)    Reaction unknown - may have been headaches  Past Medical History, Surgical history, Social history, and Family History reviewed today.    Physical Exam: Blood pressure 123/84, pulse 63, temperature 98.2 F (36.8 C), temperature source Oral, resp. rate 20, height 6\' 2"  (1.88 m), weight (!) 305 lb 4.8 oz (138.5 kg), SpO2 100 %. ECOG: 1 General appearance: Alert, awake pleasant gentleman without distress. Head: Normocephalic, atraumatic. Mucous membranes without blood blisters or erythema.   Eyes: Pupils are equal and round and reactive to light. Lymph nodes: Cervical,  supraclavicular, and axillary nodes normal. Heart:regular rate and rhythm, S1, S2 normal, no murmur, click, rub or gallop Lung:chest clear, no wheezing, rales, normal symmetric air entry Abdomen: soft, non-tender, without masses or organomegaly without shifting dullness or ascites.  Good bowel sounds. Skin: Could not appreciate any ecchymosis or petechiae. Neurological: Alert and oriented without any motor or sensory deficits.   Lab Results: Lab Results  Component Value Date   WBC 12.4 (H) 10/16/2017   HGB 13.8 09/26/2017   HCT 39.8 10/16/2017   MCV 85.6 10/16/2017   PLT 6 (LL) 10/16/2017     Chemistry      Component Value Date/Time   NA 137 09/26/2017 1433   NA 139 01/14/2015 1003   K 5.0 09/26/2017 1433   K 4.3 01/14/2015 1003   CL 104 09/26/2017 1433   CL 101 02/14/2013 0758   CO2 26 09/26/2017 1433   CO2 27 01/14/2015 1003   BUN 14 09/26/2017 1433   BUN 8.1 01/14/2015 1003   CREATININE 1.53 (H) 09/26/2017 1433   CREATININE 1.2 01/14/2015 1003      Component Value Date/Time   CALCIUM 10.1 09/26/2017 1433   CALCIUM 9.0 01/14/2015 1003   ALKPHOS 58 09/26/2017 1433   ALKPHOS 80 01/14/2015 1003   AST 17 09/26/2017 1433   AST 19 01/14/2015 1003   ALT 15 09/26/2017 1433   ALT 21 01/14/2015 1003   BILITOT 0.5 09/26/2017 1433   BILITOT 0.44 01/14/2015 1003       Results for Edward Clarke, Edward Clarke (MRN [id number]) as of 10/16/2017 10:17  Ref. Range 01/14/2015 10:03 09/26/2017 14:32  M Protein SerPl Elph-Mcnc Latest Ref Range: Not Observed g/dL  Not Observed  COMMENT (PROTEIN ELECTROPHOR) Unknown *   IFE 1 Unknown  Comment  Globulin, Total Latest Ref Range: 2.2 - 3.9 g/dL  2.6  Alpha-1-Globulin Latest Ref Range: 0.2 - 0.3 g/dL 0.3   Alpha-2-Globulin Latest Ref Range: 0.5 - 0.9 g/dL 0.6   Beta Globulin Latest Ref Range: 0.4 - 0.6 g/dL 0.3 (L)   B-Globulin SerPl Elph-Mcnc Latest Ref Range: 0.7 - 1.3 g/dL  1.0  Beta 2 Latest Ref Range: 0.2 - 0.5 g/dL 0.3   Gamma Globulin Latest  Ref Range: 0.8 - 1.7 g/dL 0.6 (L)   Abnormal Protein Band1 Latest Units: g/dL 0.2   Abnormal Protein Band2 Latest Units: g/dL NOT DET   Abnormal Protein Band3 Latest Units: g/dL NOT DET   SPE Interp. Unknown *   IgG (Immunoglobin G), Serum Latest Ref Range: 700 - 1,600 mg/dL 766 750  IgA Latest Ref Range: 90 - 386 mg/dL 19 (L) 12 (L)  IgM, Serum Latest Ref Range: 41 - 251 mg/dL 73   IgM (Immunoglobulin M), Srm Latest Ref Range: 20 - 172 mg/dL  42  Total Protein, Serum Electrophoresis Latest Ref Range: 6.1 - 8.1 g/dL 6.5      Impression and Plan:  52 year old gentleman with the following issues:  1. Lymphoplasmacytic lymphoma diagnosed in 2014.  Remains in remission  after completing 6 cycles of bendamustine and Rituxan in October 2014.  Protein studies obtained on September 26, 2017 were personally reviewed and discussed with the patient today.  Continues to show complete response.  2.ITP: Acute in nature diagnosed on September 26, 2017.  He presented with a pleural count of 1000.  He started prednisone at 80 mg daily and had an excellent response initially platelet count up to 31,000 on October 10, 2017.  His platelet count today on 10/16/2017 was 6000.  Treatment options were reviewed again given his quick relapse which include IVIG, rituximab and splenectomy.  Given his quick response to prednisone and the possibility that he might still respond to prednisone, I have recommended proceeding with IVIG initially and continue to monitor him very closely.  Rituximab will be his next option.  Complication associated with IVIG were reviewed today which include infusion related complications, arthralgias, myalgias and rarely anaphylaxis.  I gave him clear instructions to present to emergency department if he develops any active bleeding symptoms.  3.  Diabetes: His blood sugar has been slightly elevated but I urged him to continue to monitor this closely.  4.  Follow-up: Will be weekly for labs and  MD follow-up in the next few weeks.  25  minutes was spent with the patient face-to-face today.  More than 50% of time was dedicated to patient counseling, education and answering question regarding his 2 medical conditions.  Also significant time was spent on coordination of his multifaceted care.     Zola Button 2/11/201910:16 AM

## 2017-10-17 NOTE — Telephone Encounter (Signed)
Have filled out form and placed it on Dr. Ronnie Derby desk

## 2017-10-19 ENCOUNTER — Inpatient Hospital Stay: Payer: Medicare Other

## 2017-10-19 VITALS — BP 117/63 | HR 70 | Temp 98.1°F | Resp 20

## 2017-10-19 DIAGNOSIS — Z7982 Long term (current) use of aspirin: Secondary | ICD-10-CM | POA: Diagnosis not present

## 2017-10-19 DIAGNOSIS — Z8572 Personal history of non-Hodgkin lymphomas: Secondary | ICD-10-CM | POA: Diagnosis not present

## 2017-10-19 DIAGNOSIS — E1165 Type 2 diabetes mellitus with hyperglycemia: Secondary | ICD-10-CM | POA: Diagnosis not present

## 2017-10-19 DIAGNOSIS — D693 Immune thrombocytopenic purpura: Secondary | ICD-10-CM

## 2017-10-19 DIAGNOSIS — Z9221 Personal history of antineoplastic chemotherapy: Secondary | ICD-10-CM | POA: Diagnosis not present

## 2017-10-19 DIAGNOSIS — Z79899 Other long term (current) drug therapy: Secondary | ICD-10-CM | POA: Diagnosis not present

## 2017-10-19 MED ORDER — IMMUNE GLOBULIN (HUMAN) 20 GM/200ML IV SOLN
1.0000 g/kg | INTRAVENOUS | Status: DC
  Administered 2017-10-19: 140 g via INTRAVENOUS
  Filled 2017-10-19: qty 1200

## 2017-10-19 MED ORDER — SODIUM CHLORIDE 0.9 % IV SOLN
Freq: Once | INTRAVENOUS | Status: AC
  Administered 2017-10-19: 10:00:00 via INTRAVENOUS

## 2017-10-19 MED ORDER — ACETAMINOPHEN 325 MG PO TABS
650.0000 mg | ORAL_TABLET | Freq: Once | ORAL | Status: AC
  Administered 2017-10-19: 650 mg via ORAL

## 2017-10-19 MED ORDER — DIPHENHYDRAMINE HCL 25 MG PO TABS
25.0000 mg | ORAL_TABLET | Freq: Once | ORAL | Status: AC
  Administered 2017-10-19: 25 mg via ORAL
  Filled 2017-10-19: qty 1

## 2017-10-19 NOTE — Patient Instructions (Signed)

## 2017-10-20 ENCOUNTER — Other Ambulatory Visit: Payer: Self-pay

## 2017-10-20 ENCOUNTER — Inpatient Hospital Stay: Payer: Medicare Other

## 2017-10-20 ENCOUNTER — Telehealth: Payer: Self-pay

## 2017-10-20 DIAGNOSIS — E1165 Type 2 diabetes mellitus with hyperglycemia: Secondary | ICD-10-CM | POA: Diagnosis not present

## 2017-10-20 DIAGNOSIS — Z9221 Personal history of antineoplastic chemotherapy: Secondary | ICD-10-CM | POA: Diagnosis not present

## 2017-10-20 DIAGNOSIS — Z7982 Long term (current) use of aspirin: Secondary | ICD-10-CM | POA: Diagnosis not present

## 2017-10-20 DIAGNOSIS — D693 Immune thrombocytopenic purpura: Secondary | ICD-10-CM | POA: Diagnosis not present

## 2017-10-20 DIAGNOSIS — Z79899 Other long term (current) drug therapy: Secondary | ICD-10-CM | POA: Diagnosis not present

## 2017-10-20 DIAGNOSIS — Z8572 Personal history of non-Hodgkin lymphomas: Secondary | ICD-10-CM | POA: Diagnosis not present

## 2017-10-20 MED ORDER — DIPHENHYDRAMINE HCL 25 MG PO CAPS
ORAL_CAPSULE | ORAL | Status: AC
  Filled 2017-10-20: qty 1

## 2017-10-20 MED ORDER — ACETAMINOPHEN 325 MG PO TABS
650.0000 mg | ORAL_TABLET | Freq: Once | ORAL | Status: AC
  Administered 2017-10-20: 650 mg via ORAL

## 2017-10-20 MED ORDER — DIPHENHYDRAMINE HCL 25 MG PO TABS
25.0000 mg | ORAL_TABLET | Freq: Once | ORAL | Status: AC
  Administered 2017-10-20: 25 mg via ORAL
  Filled 2017-10-20: qty 1

## 2017-10-20 MED ORDER — ACETAMINOPHEN 325 MG PO TABS
ORAL_TABLET | ORAL | Status: AC
  Filled 2017-10-20: qty 2

## 2017-10-20 MED ORDER — IMMUNE GLOBULIN (HUMAN) 40 GM/400ML IV SOLN
140.0000 g | Freq: Once | INTRAVENOUS | Status: AC
  Administered 2017-10-20: 140 g via INTRAVENOUS
  Filled 2017-10-20: qty 200

## 2017-10-20 MED ORDER — SODIUM CHLORIDE 0.9 % IV SOLN
INTRAVENOUS | Status: DC
  Administered 2017-10-20: 11:00:00 via INTRAVENOUS

## 2017-10-20 NOTE — Patient Instructions (Signed)

## 2017-10-20 NOTE — Telephone Encounter (Signed)
Pt is aware that his forms can not be sent until he fills out his copy of personal information. Form is in my Dont Forget pile waiting on him to return the forms to then fax. Pt is aware of all of this information and mailed him a copy of his form today 10/20/17

## 2017-10-20 NOTE — Progress Notes (Signed)
1150-IVIG increased to 138 ml/hr x 35 ml.-pt without complaints at this time 1205-IVIG increased to 276 ml/hr x 69 ml-pt without complaints at this time 1220-IVIG increased to 552 ml/hr x 138 ml.'s-pt without complaints at this time 1500-IVIG complete

## 2017-10-23 ENCOUNTER — Inpatient Hospital Stay: Payer: Medicare Other

## 2017-10-23 ENCOUNTER — Telehealth: Payer: Self-pay | Admitting: Medical Oncology

## 2017-10-23 ENCOUNTER — Encounter: Payer: Self-pay | Admitting: Oncology

## 2017-10-23 DIAGNOSIS — E1165 Type 2 diabetes mellitus with hyperglycemia: Secondary | ICD-10-CM | POA: Diagnosis not present

## 2017-10-23 DIAGNOSIS — Z8572 Personal history of non-Hodgkin lymphomas: Secondary | ICD-10-CM | POA: Diagnosis not present

## 2017-10-23 DIAGNOSIS — Z9221 Personal history of antineoplastic chemotherapy: Secondary | ICD-10-CM | POA: Diagnosis not present

## 2017-10-23 DIAGNOSIS — D693 Immune thrombocytopenic purpura: Secondary | ICD-10-CM | POA: Diagnosis not present

## 2017-10-23 DIAGNOSIS — Z79899 Other long term (current) drug therapy: Secondary | ICD-10-CM | POA: Diagnosis not present

## 2017-10-23 DIAGNOSIS — Z7982 Long term (current) use of aspirin: Secondary | ICD-10-CM | POA: Diagnosis not present

## 2017-10-23 DIAGNOSIS — D696 Thrombocytopenia, unspecified: Secondary | ICD-10-CM

## 2017-10-23 LAB — CBC WITH DIFFERENTIAL (CANCER CENTER ONLY)
Basophils Absolute: 0 10*3/uL (ref 0.0–0.1)
Basophils Relative: 0 %
EOS ABS: 0 10*3/uL (ref 0.0–0.5)
Eosinophils Relative: 1 %
HCT: 36.9 % — ABNORMAL LOW (ref 38.4–49.9)
HEMOGLOBIN: 12.3 g/dL — AB (ref 13.0–17.1)
LYMPHS ABS: 2.6 10*3/uL (ref 0.9–3.3)
LYMPHS PCT: 33 %
MCH: 29 pg (ref 27.2–33.4)
MCHC: 33.2 g/dL (ref 32.0–36.0)
MCV: 87.3 fL (ref 79.3–98.0)
MONOS PCT: 7 %
Monocytes Absolute: 0.6 10*3/uL (ref 0.1–0.9)
NEUTROS PCT: 59 %
Neutro Abs: 4.6 10*3/uL (ref 1.5–6.5)
Platelet Count: 146 10*3/uL (ref 140–400)
RBC: 4.23 MIL/uL (ref 4.20–5.82)
RDW: 14.5 % (ref 11.0–14.6)
WBC Count: 7.8 10*3/uL (ref 4.0–10.3)

## 2017-10-23 NOTE — Telephone Encounter (Signed)
Wants cbc results. I told pt to check mychart tomorrow for cbc results as Dr Alen Blew not available today.

## 2017-10-24 ENCOUNTER — Telehealth: Payer: Self-pay | Admitting: *Deleted

## 2017-10-24 NOTE — Telephone Encounter (Signed)
As noted below by Dr. Alen Blew, I informed patient of his platelet count. Also, continue on prednisone for now. He verbalized understanding.

## 2017-10-24 NOTE — Telephone Encounter (Signed)
-----   Message from Wyatt Portela, MD sent at 10/24/2017  7:36 AM EST ----- Please let him know his platelets are normal now. Keep on prednisone for now as is.

## 2017-10-30 ENCOUNTER — Inpatient Hospital Stay: Payer: Medicare Other

## 2017-10-30 DIAGNOSIS — Z8572 Personal history of non-Hodgkin lymphomas: Secondary | ICD-10-CM | POA: Diagnosis not present

## 2017-10-30 DIAGNOSIS — D693 Immune thrombocytopenic purpura: Secondary | ICD-10-CM | POA: Diagnosis not present

## 2017-10-30 DIAGNOSIS — Z79899 Other long term (current) drug therapy: Secondary | ICD-10-CM | POA: Diagnosis not present

## 2017-10-30 DIAGNOSIS — E1165 Type 2 diabetes mellitus with hyperglycemia: Secondary | ICD-10-CM | POA: Diagnosis not present

## 2017-10-30 DIAGNOSIS — D696 Thrombocytopenia, unspecified: Secondary | ICD-10-CM

## 2017-10-30 DIAGNOSIS — Z7982 Long term (current) use of aspirin: Secondary | ICD-10-CM | POA: Diagnosis not present

## 2017-10-30 DIAGNOSIS — Z9221 Personal history of antineoplastic chemotherapy: Secondary | ICD-10-CM | POA: Diagnosis not present

## 2017-10-30 LAB — CBC WITH DIFFERENTIAL (CANCER CENTER ONLY)
BASOS ABS: 0 10*3/uL (ref 0.0–0.1)
Basophils Relative: 1 %
EOS PCT: 1 %
Eosinophils Absolute: 0 10*3/uL (ref 0.0–0.5)
HCT: 38 % — ABNORMAL LOW (ref 38.4–49.9)
HEMOGLOBIN: 12.7 g/dL — AB (ref 13.0–17.1)
LYMPHS ABS: 2.4 10*3/uL (ref 0.9–3.3)
LYMPHS PCT: 33 %
MCH: 29 pg (ref 27.2–33.4)
MCHC: 33.4 g/dL (ref 32.0–36.0)
MCV: 86.8 fL (ref 79.3–98.0)
Monocytes Absolute: 0.5 10*3/uL (ref 0.1–0.9)
Monocytes Relative: 7 %
NEUTROS PCT: 58 %
Neutro Abs: 4.2 10*3/uL (ref 1.5–6.5)
PLATELETS: 74 10*3/uL — AB (ref 140–400)
RBC: 4.37 MIL/uL (ref 4.20–5.82)
RDW: 14.3 % (ref 11.0–14.6)
WBC: 7.2 10*3/uL (ref 4.0–10.3)

## 2017-10-31 ENCOUNTER — Telehealth: Payer: Self-pay | Admitting: *Deleted

## 2017-10-31 NOTE — Telephone Encounter (Signed)
Spoke with patient, let him know his platelets are down but still good. Keep prednisone dose the same. Patient verbalized understanding.

## 2017-10-31 NOTE — Telephone Encounter (Signed)
-----   Message from Wyatt Portela, MD sent at 10/31/2017  8:05 AM EST ----- Please let him know his platelets are down but still good. Keep prednisone the same for now.

## 2017-11-06 ENCOUNTER — Inpatient Hospital Stay: Payer: Medicare Other | Attending: Oncology

## 2017-11-06 DIAGNOSIS — Z9221 Personal history of antineoplastic chemotherapy: Secondary | ICD-10-CM | POA: Diagnosis not present

## 2017-11-06 DIAGNOSIS — D696 Thrombocytopenia, unspecified: Secondary | ICD-10-CM

## 2017-11-06 DIAGNOSIS — E119 Type 2 diabetes mellitus without complications: Secondary | ICD-10-CM | POA: Insufficient documentation

## 2017-11-06 DIAGNOSIS — D693 Immune thrombocytopenic purpura: Secondary | ICD-10-CM | POA: Diagnosis not present

## 2017-11-06 DIAGNOSIS — Z79899 Other long term (current) drug therapy: Secondary | ICD-10-CM | POA: Diagnosis not present

## 2017-11-06 DIAGNOSIS — Z794 Long term (current) use of insulin: Secondary | ICD-10-CM | POA: Insufficient documentation

## 2017-11-06 DIAGNOSIS — Z7982 Long term (current) use of aspirin: Secondary | ICD-10-CM | POA: Diagnosis not present

## 2017-11-06 DIAGNOSIS — Z8572 Personal history of non-Hodgkin lymphomas: Secondary | ICD-10-CM | POA: Diagnosis not present

## 2017-11-06 LAB — CBC WITH DIFFERENTIAL (CANCER CENTER ONLY)
Basophils Absolute: 0 10*3/uL (ref 0.0–0.1)
Basophils Relative: 0 %
EOS PCT: 1 %
Eosinophils Absolute: 0.1 10*3/uL (ref 0.0–0.5)
HEMATOCRIT: 40.4 % (ref 38.4–49.9)
Hemoglobin: 13.3 g/dL (ref 13.0–17.1)
LYMPHS ABS: 2.8 10*3/uL (ref 0.9–3.3)
LYMPHS PCT: 43 %
MCH: 28.5 pg (ref 27.2–33.4)
MCHC: 32.8 g/dL (ref 32.0–36.0)
MCV: 86.9 fL (ref 79.3–98.0)
MONO ABS: 0.5 10*3/uL (ref 0.1–0.9)
MONOS PCT: 7 %
Neutro Abs: 3.2 10*3/uL (ref 1.5–6.5)
Neutrophils Relative %: 49 %
PLATELETS: 46 10*3/uL — AB (ref 140–400)
RBC: 4.65 MIL/uL (ref 4.20–5.82)
RDW: 14.3 % (ref 11.0–14.6)
WBC Count: 6.5 10*3/uL (ref 4.0–10.3)

## 2017-11-09 ENCOUNTER — Telehealth: Payer: Self-pay | Admitting: *Deleted

## 2017-11-09 NOTE — Telephone Encounter (Signed)
Received call from pt stating that he has not seen results on MyChart for CBC & asked what his platelets are.  Reviewed chart & informed that platelets were 47k on Monday.  He reports no bleeding but he thought that his count was down by the way he felt.  He states he just doesn't feel as well as he did.  He has an appt on Mon to see Dr Alen Blew.  Informed to call if any bleeding/bruising noted.  Message to Dr Corliss Parish RN

## 2017-11-13 ENCOUNTER — Inpatient Hospital Stay (HOSPITAL_BASED_OUTPATIENT_CLINIC_OR_DEPARTMENT_OTHER): Payer: Medicare Other | Admitting: Oncology

## 2017-11-13 ENCOUNTER — Inpatient Hospital Stay: Payer: Medicare Other

## 2017-11-13 ENCOUNTER — Telehealth: Payer: Self-pay | Admitting: Oncology

## 2017-11-13 VITALS — BP 131/88 | HR 65 | Temp 98.1°F | Resp 18 | Ht 74.0 in | Wt 305.1 lb

## 2017-11-13 DIAGNOSIS — D696 Thrombocytopenia, unspecified: Secondary | ICD-10-CM

## 2017-11-13 DIAGNOSIS — Z79899 Other long term (current) drug therapy: Secondary | ICD-10-CM

## 2017-11-13 DIAGNOSIS — E119 Type 2 diabetes mellitus without complications: Secondary | ICD-10-CM | POA: Diagnosis not present

## 2017-11-13 DIAGNOSIS — Z7982 Long term (current) use of aspirin: Secondary | ICD-10-CM | POA: Diagnosis not present

## 2017-11-13 DIAGNOSIS — Z794 Long term (current) use of insulin: Secondary | ICD-10-CM

## 2017-11-13 DIAGNOSIS — Z8572 Personal history of non-Hodgkin lymphomas: Secondary | ICD-10-CM | POA: Diagnosis not present

## 2017-11-13 DIAGNOSIS — D693 Immune thrombocytopenic purpura: Secondary | ICD-10-CM

## 2017-11-13 DIAGNOSIS — Z9221 Personal history of antineoplastic chemotherapy: Secondary | ICD-10-CM

## 2017-11-13 LAB — CBC WITH DIFFERENTIAL (CANCER CENTER ONLY)
BASOS ABS: 0 10*3/uL (ref 0.0–0.1)
BASOS PCT: 0 %
EOS ABS: 0 10*3/uL (ref 0.0–0.5)
Eosinophils Relative: 1 %
HEMATOCRIT: 39.1 % (ref 38.4–49.9)
Hemoglobin: 13.5 g/dL (ref 13.0–17.1)
Lymphocytes Relative: 39 %
Lymphs Abs: 2.7 10*3/uL (ref 0.9–3.3)
MCH: 29.6 pg (ref 27.2–33.4)
MCHC: 34.5 g/dL (ref 32.0–36.0)
MCV: 85.7 fL (ref 79.3–98.0)
MONO ABS: 0.5 10*3/uL (ref 0.1–0.9)
MONOS PCT: 7 %
NEUTROS ABS: 3.7 10*3/uL (ref 1.5–6.5)
Neutrophils Relative %: 53 %
Platelet Count: 55 10*3/uL — ABNORMAL LOW (ref 140–400)
RBC: 4.56 MIL/uL (ref 4.20–5.82)
RDW: 14.2 % (ref 11.0–14.6)
WBC Count: 6.9 10*3/uL (ref 4.0–10.3)

## 2017-11-13 NOTE — Telephone Encounter (Signed)
Appointments scheduled AVS/Calendar printed per 3/11 los °

## 2017-11-13 NOTE — Progress Notes (Signed)
Hematology and Oncology Follow Up Visit  Edward Clarke [id number] 1966-08-13 52 y.o. 11/13/2017 10:01 AM Patient, No Pcp PerNo ref. provider found   Principle Diagnosis: 51 year old man with:  1. Lymphoplasmacytic lymphoma (Waldenstrm's macroglobulinemia) diagnosed in 2014.  He presented with IgM about 3 g/dL and M-spike about 2 g/dL. He remains in remission at this time.  2.  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  He has achieved partial response on steroid and IVIG.  Previous therapy:   Chemotherapy with Bendamustine and Rituxan on 02/14/13. He is S/P cycle 6 on 07/04/2013 with CR.  Status post IVIG completed on 10/20/2017.  Current therapy:   Prednisone 80 mg daily started on September 26, 2017.  Interim History:  Edward Clarke presents today for a follow-up visit. Since the last visit, he receives IVIG and continues to be on prednisone. He reports no active bleeding at this time and has tolerated prednisone reasonably well. He is more hyper and have been more energetic. He denied any insomnia or palpitation. He is a blood sugar continues to be maintained at this time. His performance status and quality of life is not changed. He denied any lymphadenopathy or petechiae.  He does not report any headaches, blurry vision, syncope or seizures. Does not report any fevers, chills or sweats.  Does not report any cough, wheezing or hemoptysis.  Does not report any chest pain, palpitation, orthopnea or leg edema.  Does not report any nausea, vomiting or abdominal pain.  She does not report any constipation or diarrhea.  Does not report any bone pain or pathological fractures.   Does not report frequency, urgency or hematuria.  Does not report any skin rashes or lesions. Does not report any heat or cold intolerance.  Does not report any lymphadenopathy.  He does report easy bruising.  Does not report any anxiety or depression.  Remaining review of systems is negative.  .  Medications: I have reviewed the  patient's current medications.  Current Outpatient Medications  Medication Sig Dispense Refill  . aspirin EC 81 MG tablet Take 81 mg by mouth every morning. Reported on 02/18/2016    . atorvastatin (LIPITOR) 10 MG tablet Take 1 tablet (10 mg total) by mouth daily. 30 tablet 3  . glucose blood (ONETOUCH VERIO) test strip USE ONE STRIP TO CHECK GLUCOSE 4 TIMES DAILY Dx code E11.65 400 each 0  . ibuprofen (ADVIL,MOTRIN) 200 MG tablet Take 400-800 mg by mouth every 8 (eight) hours as needed for headache. Reported on 02/18/2016    . Insulin Glargine (TOUJEO SOLOSTAR) 300 UNIT/ML SOPN Inject 50 Units into the skin daily. 5 pen 2  . Insulin Glulisine (APIDRA SOLOSTAR) 100 UNIT/ML Solostar Pen Inject 20 Units into the skin 2 (two) times daily. 5 pen 2  . Insulin Pen Needle 31G X 5 MM MISC Use to inject insulin once daily 30 each 3  . lisinopril (PRINIVIL,ZESTRIL) 20 MG tablet TAKE 1 TABLET BY MOUTH ONCE DAILY 90 tablet 3  . metFORMIN (GLUCOPHAGE) 1000 MG tablet Take 1 tablet (1,000 mg total) by mouth 2 (two) times daily with a meal. 60 tablet 3  . predniSONE (DELTASONE) 20 MG tablet Take 4 tablets daily with food. 120 tablet 1   No current facility-administered medications for this visit.      Allergies:  Allergies  Allergen Reactions  . Benazepril Other (See Comments)    Reaction unknown - may have been headaches    Past Medical History, Surgical history, Social history, and Family History reviewed  today.    Physical Exam: He also follows personally reviewed today. Blood pressure is 131/80, pulse is 65 and he is afebrile.  ECOG: 1 General appearance: Well-appearing gentleman appeared comfortable. Head: Without any abnormalities. Oropharynx: Without thrush or ulcers. Eyes: Sclerae anicteric. Lymph nodes: Cervical, supraclavicular, and axillary nodes normal. Heart: Regular rate and rhythm without murmurs or gallops. Lung: Clear to auscultation without any rhonchi, wheezes or nodules to  percussion. Abdomen: Soft, nontender without any rebound or guarding. No shifting dullness or ascites. Skin: No petechiae or rash. Neurological: No motor or sensory deficits.   Lab Results: Lab Results  Component Value Date   WBC 6.9 11/13/2017   HGB 13.8 09/26/2017   HCT 39.1 11/13/2017   MCV 85.7 11/13/2017   PLT 55 (L) 11/13/2017     Chemistry      Component Value Date/Time   NA 137 09/26/2017 1433   NA 139 01/14/2015 1003   K 5.0 09/26/2017 1433   K 4.3 01/14/2015 1003   CL 104 09/26/2017 1433   CL 101 02/14/2013 0758   CO2 26 09/26/2017 1433   CO2 27 01/14/2015 1003   BUN 14 09/26/2017 1433   BUN 8.1 01/14/2015 1003   CREATININE 1.53 (H) 09/26/2017 1433   CREATININE 1.2 01/14/2015 1003      Component Value Date/Time   CALCIUM 10.1 09/26/2017 1433   CALCIUM 9.0 01/14/2015 1003   ALKPHOS 58 09/26/2017 1433   ALKPHOS 80 01/14/2015 1003   AST 17 09/26/2017 1433   AST 19 01/14/2015 1003   ALT 15 09/26/2017 1433   ALT 21 01/14/2015 1003   BILITOT 0.5 09/26/2017 1433   BILITOT 0.44 01/14/2015 1003         Impression and Plan:  52 year old man with:  1. Lymphoplasmacytic lymphoma diagnosed in 2014.  He achieved remission after definitive therapy outlined above. Protein studies in January 2019 showed no evidence of relapse.   2. Acute ITP: He responded reasonably well to IVIG and prednisone. His platelet count currently has been stable around 50,000. The plan is to continue with steroid and start taper of 10 mg every week. He will take 70 mg this week and will check his platelets weekly.   I continue to emphasize of reporting any active bleeding between visits.  3.  Diabetes: Blood sugar has been maintained reasonably well at this time.  4.  Follow-up: Will be weekly for labs and MD follow-up in 5 weeks.  15  minutes was spent with the patient face-to-face today.  More than 50% of time was dedicated to patient counseling, education and coordination of his  care.    Roxy Cedar Khasir Woodrome 3/11/201910:01 AM

## 2017-11-21 ENCOUNTER — Inpatient Hospital Stay: Payer: Medicare Other

## 2017-11-21 DIAGNOSIS — Z7982 Long term (current) use of aspirin: Secondary | ICD-10-CM | POA: Diagnosis not present

## 2017-11-21 DIAGNOSIS — D696 Thrombocytopenia, unspecified: Secondary | ICD-10-CM

## 2017-11-21 DIAGNOSIS — D693 Immune thrombocytopenic purpura: Secondary | ICD-10-CM | POA: Diagnosis not present

## 2017-11-21 DIAGNOSIS — Z9221 Personal history of antineoplastic chemotherapy: Secondary | ICD-10-CM | POA: Diagnosis not present

## 2017-11-21 DIAGNOSIS — E119 Type 2 diabetes mellitus without complications: Secondary | ICD-10-CM | POA: Diagnosis not present

## 2017-11-21 DIAGNOSIS — Z794 Long term (current) use of insulin: Secondary | ICD-10-CM | POA: Diagnosis not present

## 2017-11-21 DIAGNOSIS — Z8572 Personal history of non-Hodgkin lymphomas: Secondary | ICD-10-CM | POA: Diagnosis not present

## 2017-11-21 LAB — CBC WITH DIFFERENTIAL (CANCER CENTER ONLY)
Basophils Absolute: 0 10*3/uL (ref 0.0–0.1)
Basophils Relative: 0 %
EOS PCT: 1 %
Eosinophils Absolute: 0.1 10*3/uL (ref 0.0–0.5)
HEMATOCRIT: 41.1 % (ref 38.4–49.9)
Hemoglobin: 14.2 g/dL (ref 13.0–17.1)
LYMPHS PCT: 37 %
Lymphs Abs: 2.9 10*3/uL (ref 0.9–3.3)
MCH: 29.8 pg (ref 27.2–33.4)
MCHC: 34.5 g/dL (ref 32.0–36.0)
MCV: 86.2 fL (ref 79.3–98.0)
MONO ABS: 0.6 10*3/uL (ref 0.1–0.9)
MONOS PCT: 8 %
NEUTROS ABS: 4.3 10*3/uL (ref 1.5–6.5)
Neutrophils Relative %: 54 %
PLATELETS: 34 10*3/uL — AB (ref 140–400)
RBC: 4.77 MIL/uL (ref 4.20–5.82)
RDW: 14.1 % (ref 11.0–14.6)
WBC Count: 7.8 10*3/uL (ref 4.0–10.3)

## 2017-11-27 ENCOUNTER — Other Ambulatory Visit: Payer: Self-pay

## 2017-11-27 ENCOUNTER — Telehealth: Payer: Self-pay | Admitting: Endocrinology

## 2017-11-27 MED ORDER — INSULIN PEN NEEDLE 31G X 5 MM MISC: 3 refills | Status: DC

## 2017-11-27 MED ORDER — ATORVASTATIN CALCIUM 10 MG PO TABS: 10.0000 mg | ORAL_TABLET | Freq: Every day | ORAL | 3 refills | Status: DC

## 2017-11-27 MED ORDER — METFORMIN HCL 1000 MG PO TABS: 1000.0000 mg | ORAL_TABLET | Freq: Two times a day (BID) | ORAL | 3 refills | Status: DC

## 2017-11-27 MED ORDER — GLUCOSE BLOOD VI STRP: ORAL_STRIP | 0 refills | Status: DC

## 2017-11-27 NOTE — Telephone Encounter (Signed)
This has been done.

## 2017-11-27 NOTE — Telephone Encounter (Signed)
Patient need dx code for test strips  Original Order:  glucose blood [facility] VERIO) test strip [[id number]]    Pharmacy:  [facility], [place]     asap he asked

## 2017-11-28 ENCOUNTER — Inpatient Hospital Stay: Payer: Medicare Other

## 2017-11-28 ENCOUNTER — Telehealth: Payer: Self-pay | Admitting: *Deleted

## 2017-11-28 DIAGNOSIS — Z794 Long term (current) use of insulin: Secondary | ICD-10-CM | POA: Diagnosis not present

## 2017-11-28 DIAGNOSIS — D693 Immune thrombocytopenic purpura: Secondary | ICD-10-CM | POA: Diagnosis not present

## 2017-11-28 DIAGNOSIS — Z8572 Personal history of non-Hodgkin lymphomas: Secondary | ICD-10-CM | POA: Diagnosis not present

## 2017-11-28 DIAGNOSIS — D696 Thrombocytopenia, unspecified: Secondary | ICD-10-CM

## 2017-11-28 DIAGNOSIS — Z9221 Personal history of antineoplastic chemotherapy: Secondary | ICD-10-CM | POA: Diagnosis not present

## 2017-11-28 DIAGNOSIS — E119 Type 2 diabetes mellitus without complications: Secondary | ICD-10-CM | POA: Diagnosis not present

## 2017-11-28 DIAGNOSIS — Z7982 Long term (current) use of aspirin: Secondary | ICD-10-CM | POA: Diagnosis not present

## 2017-11-28 LAB — CBC WITH DIFFERENTIAL (CANCER CENTER ONLY)
Basophils Absolute: 0.1 10*3/uL (ref 0.0–0.1)
Basophils Relative: 1 %
EOS PCT: 1 %
Eosinophils Absolute: 0.1 10*3/uL (ref 0.0–0.5)
HEMATOCRIT: 39.8 % (ref 38.4–49.9)
HEMOGLOBIN: 13.4 g/dL (ref 13.0–17.1)
LYMPHS ABS: 3.5 10*3/uL — AB (ref 0.9–3.3)
LYMPHS PCT: 42 %
MCH: 29.1 pg (ref 27.2–33.4)
MCHC: 33.7 g/dL (ref 32.0–36.0)
MCV: 86.6 fL (ref 79.3–98.0)
Monocytes Absolute: 0.5 10*3/uL (ref 0.1–0.9)
Monocytes Relative: 7 %
NEUTROS ABS: 4.1 10*3/uL (ref 1.5–6.5)
NEUTROS PCT: 49 %
Platelet Count: 38 10*3/uL — ABNORMAL LOW (ref 140–400)
RBC: 4.6 MIL/uL (ref 4.20–5.82)
RDW: 14.8 % — ABNORMAL HIGH (ref 11.0–14.6)
WBC: 8.3 10*3/uL (ref 4.0–10.3)

## 2017-11-28 NOTE — Telephone Encounter (Signed)
-----   Message from Wyatt Portela, MD sent at 11/28/2017 10:22 AM EDT ----- Please let him know Platelets are stable. Continue with Prednisone taper.

## 2017-11-28 NOTE — Telephone Encounter (Signed)
Spoke with patient, per dr Alen Blew, platelets stable, continue with prednisone taper.

## 2017-11-30 ENCOUNTER — Encounter: Payer: Self-pay | Admitting: Oncology

## 2017-12-01 ENCOUNTER — Other Ambulatory Visit: Payer: Self-pay | Admitting: Oncology

## 2017-12-01 ENCOUNTER — Telehealth: Payer: Self-pay | Admitting: *Deleted

## 2017-12-01 NOTE — Telephone Encounter (Signed)
Patient sent message in my chart stating he has low energy and skin feels funny, per dr Alen Blew, this is due to the prednisone taper and dr Alen Blew plans to try something else soon. Patient verbalized understanding.

## 2017-12-05 ENCOUNTER — Other Ambulatory Visit: Payer: Self-pay | Admitting: Oncology

## 2017-12-05 ENCOUNTER — Telehealth: Payer: Self-pay | Admitting: *Deleted

## 2017-12-05 ENCOUNTER — Inpatient Hospital Stay: Payer: Medicare Other | Attending: Oncology

## 2017-12-05 DIAGNOSIS — Z5112 Encounter for antineoplastic immunotherapy: Secondary | ICD-10-CM | POA: Insufficient documentation

## 2017-12-05 DIAGNOSIS — Z79899 Other long term (current) drug therapy: Secondary | ICD-10-CM | POA: Diagnosis not present

## 2017-12-05 DIAGNOSIS — Z8572 Personal history of non-Hodgkin lymphomas: Secondary | ICD-10-CM | POA: Diagnosis not present

## 2017-12-05 DIAGNOSIS — D693 Immune thrombocytopenic purpura: Secondary | ICD-10-CM | POA: Diagnosis not present

## 2017-12-05 DIAGNOSIS — Z794 Long term (current) use of insulin: Secondary | ICD-10-CM | POA: Insufficient documentation

## 2017-12-05 DIAGNOSIS — E119 Type 2 diabetes mellitus without complications: Secondary | ICD-10-CM | POA: Insufficient documentation

## 2017-12-05 DIAGNOSIS — D696 Thrombocytopenia, unspecified: Secondary | ICD-10-CM

## 2017-12-05 LAB — CBC WITH DIFFERENTIAL (CANCER CENTER ONLY)
BASOS PCT: 0 %
Basophils Absolute: 0 10*3/uL (ref 0.0–0.1)
EOS ABS: 0.1 10*3/uL (ref 0.0–0.5)
EOS PCT: 1 %
HCT: 40.9 % (ref 38.4–49.9)
Hemoglobin: 14.2 g/dL (ref 13.0–17.1)
Lymphocytes Relative: 36 %
Lymphs Abs: 3.5 10*3/uL — ABNORMAL HIGH (ref 0.9–3.3)
MCH: 29.8 pg (ref 27.2–33.4)
MCHC: 34.7 g/dL (ref 32.0–36.0)
MCV: 85.7 fL (ref 79.3–98.0)
MONOS PCT: 8 %
Monocytes Absolute: 0.8 10*3/uL (ref 0.1–0.9)
NEUTROS PCT: 55 %
Neutro Abs: 5.3 10*3/uL (ref 1.5–6.5)
PLATELETS: 23 10*3/uL — AB (ref 140–400)
RBC: 4.77 MIL/uL (ref 4.20–5.82)
RDW: 14 % (ref 11.0–14.6)
WBC Count: 9.7 10*3/uL (ref 4.0–10.3)

## 2017-12-05 NOTE — Progress Notes (Signed)
DISCONTINUE ON PATHWAY REGIMEN - [Other Dx]     START OFF PATHWAY REGIMEN - [Other Dx]   [id number]:Rituximab (Weekly):   Administer weekly:     Rituximab   **Always confirm dose/schedule in your pharmacy ordering system**    Patient Characteristics: Intent of Therapy: Curative Intent, Discussed with Patient 

## 2017-12-05 NOTE — Telephone Encounter (Signed)
-----   Message from Wyatt Portela, MD sent at 12/05/2017  9:55 AM EDT ----- Please let him know the results. He is to continue steroid taper.  We will schedule Rituxan treatment. Weekly for 4 treatment.

## 2017-12-05 NOTE — Telephone Encounter (Signed)
As noted below by Dr. Alen Blew, I informed him of his platelet count and told him to continue the steroid taper. Also, scheduling will be calling him to schedule weekly Rituxan treatments for four weeks. He verbalized understanding.

## 2017-12-06 ENCOUNTER — Encounter: Payer: Self-pay | Admitting: Oncology

## 2017-12-07 ENCOUNTER — Other Ambulatory Visit: Payer: Self-pay | Admitting: *Deleted

## 2017-12-07 MED ORDER — PREDNISONE 20 MG PO TABS: ORAL_TABLET | ORAL | 0 refills | Status: DC

## 2017-12-11 ENCOUNTER — Other Ambulatory Visit: Payer: Self-pay

## 2017-12-11 DIAGNOSIS — E1165 Type 2 diabetes mellitus with hyperglycemia: Secondary | ICD-10-CM

## 2017-12-11 DIAGNOSIS — Z794 Long term (current) use of insulin: Secondary | ICD-10-CM

## 2017-12-11 MED ORDER — GLUCOSE BLOOD VI STRP
ORAL_STRIP | 0 refills | Status: AC
Start: 2017-12-11 — End: ?

## 2017-12-11 MED ORDER — INSULIN PEN NEEDLE 31G X 5 MM MISC: 3 refills | Status: AC

## 2017-12-12 ENCOUNTER — Encounter: Payer: Self-pay | Admitting: Oncology

## 2017-12-12 ENCOUNTER — Inpatient Hospital Stay: Payer: Medicare Other

## 2017-12-12 VITALS — BP 140/75 | HR 81 | Temp 98.8°F | Resp 20

## 2017-12-12 DIAGNOSIS — Z8572 Personal history of non-Hodgkin lymphomas: Secondary | ICD-10-CM | POA: Diagnosis not present

## 2017-12-12 DIAGNOSIS — Z794 Long term (current) use of insulin: Secondary | ICD-10-CM | POA: Diagnosis not present

## 2017-12-12 DIAGNOSIS — D696 Thrombocytopenia, unspecified: Secondary | ICD-10-CM

## 2017-12-12 DIAGNOSIS — D693 Immune thrombocytopenic purpura: Secondary | ICD-10-CM | POA: Diagnosis not present

## 2017-12-12 DIAGNOSIS — E119 Type 2 diabetes mellitus without complications: Secondary | ICD-10-CM | POA: Diagnosis not present

## 2017-12-12 DIAGNOSIS — Z5112 Encounter for antineoplastic immunotherapy: Secondary | ICD-10-CM | POA: Diagnosis not present

## 2017-12-12 DIAGNOSIS — Z79899 Other long term (current) drug therapy: Secondary | ICD-10-CM | POA: Diagnosis not present

## 2017-12-12 LAB — CBC WITH DIFFERENTIAL (CANCER CENTER ONLY)
BASOS ABS: 0.1 10*3/uL (ref 0.0–0.1)
BASOS PCT: 1 %
Eosinophils Absolute: 0.1 10*3/uL (ref 0.0–0.5)
Eosinophils Relative: 1 %
HCT: 39.8 % (ref 38.4–49.9)
Hemoglobin: 13.8 g/dL (ref 13.0–17.1)
Lymphocytes Relative: 31 %
Lymphs Abs: 2.6 10*3/uL (ref 0.9–3.3)
MCH: 29.7 pg (ref 27.2–33.4)
MCHC: 34.7 g/dL (ref 32.0–36.0)
MCV: 85.6 fL (ref 79.3–98.0)
MONO ABS: 0.7 10*3/uL (ref 0.1–0.9)
MONOS PCT: 8 %
NEUTROS ABS: 5 10*3/uL (ref 1.5–6.5)
Neutrophils Relative %: 59 %
PLATELETS: 23 10*3/uL — AB (ref 140–400)
RBC: 4.65 MIL/uL (ref 4.20–5.82)
RDW: 14 % (ref 11.0–14.6)
WBC Count: 8.4 10*3/uL (ref 4.0–10.3)

## 2017-12-12 MED ORDER — SODIUM CHLORIDE 0.9 % IV SOLN
Freq: Once | INTRAVENOUS | Status: AC
  Administered 2017-12-12: 09:00:00 via INTRAVENOUS

## 2017-12-12 MED ORDER — ACETAMINOPHEN 325 MG PO TABS
650.0000 mg | ORAL_TABLET | Freq: Once | ORAL | Status: AC
  Administered 2017-12-12: 650 mg via ORAL

## 2017-12-12 MED ORDER — DIPHENHYDRAMINE HCL 25 MG PO CAPS
ORAL_CAPSULE | ORAL | Status: AC
  Filled 2017-12-12: qty 2

## 2017-12-12 MED ORDER — ACETAMINOPHEN 325 MG PO TABS
ORAL_TABLET | ORAL | Status: AC
  Filled 2017-12-12: qty 2

## 2017-12-12 MED ORDER — SODIUM CHLORIDE 0.9 % IV SOLN
375.0000 mg/m2 | Freq: Once | INTRAVENOUS | Status: AC
  Administered 2017-12-12: 1000 mg via INTRAVENOUS
  Filled 2017-12-12: qty 100

## 2017-12-12 MED ORDER — DIPHENHYDRAMINE HCL 25 MG PO CAPS
50.0000 mg | ORAL_CAPSULE | Freq: Once | ORAL | Status: AC
  Administered 2017-12-12: 50 mg via ORAL

## 2017-12-12 NOTE — Progress Notes (Signed)
1230.  Pt states his throat feels a little scratchy and his temp is increased.  Will keep rate the same for another 30 minutes and observe closely.

## 2017-12-12 NOTE — Patient Instructions (Signed)
Rituximab injection What is this medicine? RITUXIMAB (ri TUX i mab) is a monoclonal antibody. It is used to treat certain types of cancer like non-Hodgkin lymphoma and chronic lymphocytic leukemia. It is also used to treat rheumatoid arthritis, granulomatosis with polyangiitis (or Wegener's granulomatosis), and microscopic polyangiitis. This medicine may be used for other purposes; ask your health care provider or pharmacist if you have questions. COMMON BRAND NAME(S): Rituxan What should I tell my health care provider before I take this medicine? They need to know if you have any of these conditions: -heart disease -infection (especially a virus infection such as hepatitis B, chickenpox, cold sores, or herpes) -immune system problems -irregular heartbeat -kidney disease -lung or breathing disease, like asthma -recently received or scheduled to receive a vaccine -an unusual or allergic reaction to rituximab, mouse proteins, other medicines, foods, dyes, or preservatives -pregnant or trying to get pregnant -breast-feeding How should I use this medicine? This medicine is for infusion into a vein. It is administered in a hospital or clinic by a specially trained health care professional. A special MedGuide will be given to you by the pharmacist with each prescription and refill. Be sure to read this information carefully each time. Talk to your pediatrician regarding the use of this medicine in children. This medicine is not approved for use in children. Overdosage: If you think you have taken too much of this medicine contact a poison control center or emergency room at once. NOTE: This medicine is only for you. Do not share this medicine with others. What if I miss a dose? It is important not to miss a dose. Call your doctor or health care professional if you are unable to keep an appointment. What may interact with this medicine? -cisplatin -other medicines for arthritis like disease  modifying antirheumatic drugs or tumor necrosis factor inhibitors -live virus vaccines This list may not describe all possible interactions. Give your health care provider a list of all the medicines, herbs, non-prescription drugs, or dietary supplements you use. Also tell them if you smoke, drink alcohol, or use illegal drugs. Some items may interact with your medicine. What should I watch for while using this medicine? Your condition will be monitored carefully while you are receiving this medicine. You may need blood work done while you are taking this medicine. This medicine can cause serious allergic reactions. To reduce your risk you may need to take medicine before treatment with this medicine. Take your medicine as directed. In some patients, this medicine may cause a serious brain infection that may cause death. If you have any problems seeing, thinking, speaking, walking, or standing, tell your doctor right away. If you cannot reach your doctor, urgently seek other source of medical care. Call your doctor or health care professional for advice if you get a fever, chills or sore throat, or other symptoms of a cold or flu. Do not treat yourself. This drug decreases your body's ability to fight infections. Try to avoid being around people who are sick. Do not become pregnant while taking this medicine or for 12 months after stopping it. Women should inform their doctor if they wish to become pregnant or think they might be pregnant. There is a potential for serious side effects to an unborn child. Talk to your health care professional or pharmacist for more information. What side effects may I notice from receiving this medicine? Side effects that you should report to your doctor or health care professional as soon as possible: -breathing   problems -chest pain -dizziness or feeling faint -fast, irregular heartbeat -low blood counts - this medicine may decrease the number of white blood cells,  red blood cells and platelets. You may be at increased risk for infections and bleeding. -mouth sores -redness, blistering, peeling or loosening of the skin, including inside the mouth (this can be added for any serious or exfoliative rash that could lead to hospitalization) -signs of infection - fever or chills, cough, sore throat, pain or difficulty passing urine -signs and symptoms of kidney injury like trouble passing urine or change in the amount of urine -signs and symptoms of liver injury like dark yellow or brown urine; general ill feeling or flu-like symptoms; light-colored stools; loss of appetite; nausea; right upper belly pain; unusually weak or tired; yellowing of the eyes or skin -stomach pain -vomiting Side effects that usually do not require medical attention (report to your doctor or health care professional if they continue or are bothersome): -headache -joint pain -muscle cramps or muscle pain This list may not describe all possible side effects. Call your doctor for medical advice about side effects. You may report side effects to FDA at 1-800-FDA-1088. Where should I keep my medicine? This drug is given in a hospital or clinic and will not be stored at home. NOTE: This sheet is a summary. It may not cover all possible information. If you have questions about this medicine, talk to your doctor, pharmacist, or health care provider.  2018 Elsevier/Gold Standard (2016-03-30 15:28:09) [facility] Discharge Instructions for Patients Receiving Chemotherapy  Today you received the following chemotherapy agents rituxan.  To help prevent nausea and vomiting after your treatment, we encourage you to take your nausea medidication.   If you develop nausea and vomiting that is not controlled by your nausea medication, call the clinic.   BELOW ARE SYMPTOMS THAT SHOULD BE REPORTED IMMEDIATELY:  *FEVER GREATER THAN 100.5 F  *CHILLS WITH OR WITHOUT FEVER  NAUSEA AND  VOMITING THAT IS NOT CONTROLLED WITH YOUR NAUSEA MEDICATION  *UNUSUAL SHORTNESS OF BREATH  *UNUSUAL BRUISING OR BLEEDING  TENDERNESS IN MOUTH AND THROAT WITH OR WITHOUT PRESENCE OF ULCERS  *URINARY PROBLEMS  *BOWEL PROBLEMS  UNUSUAL RASH Items with * indicate a potential emergency and should be followed up as soon as possible.  Feel free to call the clinic should you have any questions or concerns. The clinic phone number is [phone].  Please show the [facility] at check-in to the Emergency Department and triage nurse.

## 2017-12-18 ENCOUNTER — Inpatient Hospital Stay (HOSPITAL_BASED_OUTPATIENT_CLINIC_OR_DEPARTMENT_OTHER): Payer: Medicare Other | Admitting: Oncology

## 2017-12-18 ENCOUNTER — Telehealth: Payer: Self-pay

## 2017-12-18 ENCOUNTER — Inpatient Hospital Stay: Payer: Medicare Other

## 2017-12-18 VITALS — BP 145/88 | HR 67 | Temp 97.7°F | Resp 22 | Ht 74.0 in | Wt 310.3 lb

## 2017-12-18 DIAGNOSIS — Z5112 Encounter for antineoplastic immunotherapy: Secondary | ICD-10-CM | POA: Diagnosis not present

## 2017-12-18 DIAGNOSIS — Z79899 Other long term (current) drug therapy: Secondary | ICD-10-CM | POA: Diagnosis not present

## 2017-12-18 DIAGNOSIS — D693 Immune thrombocytopenic purpura: Secondary | ICD-10-CM

## 2017-12-18 DIAGNOSIS — D696 Thrombocytopenia, unspecified: Secondary | ICD-10-CM

## 2017-12-18 DIAGNOSIS — Z794 Long term (current) use of insulin: Secondary | ICD-10-CM | POA: Diagnosis not present

## 2017-12-18 DIAGNOSIS — E119 Type 2 diabetes mellitus without complications: Secondary | ICD-10-CM

## 2017-12-18 DIAGNOSIS — Z8572 Personal history of non-Hodgkin lymphomas: Secondary | ICD-10-CM

## 2017-12-18 LAB — CBC WITH DIFFERENTIAL (CANCER CENTER ONLY)
Basophils Absolute: 0 10*3/uL (ref 0.0–0.1)
Basophils Relative: 0 %
Eosinophils Absolute: 0 10*3/uL (ref 0.0–0.5)
Eosinophils Relative: 0 %
HEMATOCRIT: 39.4 % (ref 38.4–49.9)
HEMOGLOBIN: 13.4 g/dL (ref 13.0–17.1)
LYMPHS ABS: 0.9 10*3/uL (ref 0.9–3.3)
LYMPHS PCT: 12 %
MCH: 29.2 pg (ref 27.2–33.4)
MCHC: 34 g/dL (ref 32.0–36.0)
MCV: 85.8 fL (ref 79.3–98.0)
MONO ABS: 0.5 10*3/uL (ref 0.1–0.9)
MONOS PCT: 7 %
NEUTROS ABS: 6 10*3/uL (ref 1.5–6.5)
NEUTROS PCT: 81 %
Platelet Count: 45 10*3/uL — ABNORMAL LOW (ref 140–400)
RBC: 4.59 MIL/uL (ref 4.20–5.82)
RDW: 14 % (ref 11.0–14.6)
WBC Count: 7.4 10*3/uL (ref 4.0–10.3)

## 2017-12-18 LAB — CMP (CANCER CENTER ONLY)
ALK PHOS: 46 U/L (ref 40–150)
ALT: 28 U/L (ref 0–55)
ANION GAP: 8 (ref 3–11)
AST: 19 U/L (ref 5–34)
Albumin: 3.7 g/dL (ref 3.5–5.0)
BUN: 15 mg/dL (ref 7–26)
CALCIUM: 9.7 mg/dL (ref 8.4–10.4)
CO2: 27 mmol/L (ref 22–29)
Chloride: 101 mmol/L (ref 98–109)
Creatinine: 1.4 mg/dL — ABNORMAL HIGH (ref 0.70–1.30)
GFR, EST NON AFRICAN AMERICAN: 56 mL/min — AB (ref >60)
GLUCOSE: 172 mg/dL — AB (ref 70–140)
POTASSIUM: 5.3 mmol/L — AB (ref 3.5–5.1)
Sodium: 136 mmol/L (ref 136–145)
TOTAL PROTEIN: 6.5 g/dL (ref 6.4–8.3)
Total Bilirubin: 0.3 mg/dL (ref 0.2–1.2)

## 2017-12-18 NOTE — Telephone Encounter (Signed)
Printed avs and calender of upcoming appointment. Per 4/15 los 

## 2017-12-18 NOTE — Progress Notes (Signed)
Hematology and Oncology Follow Up Visit  Edward Clarke [id number] 12-08-1965 52 y.o. 12/18/2017 9:08 AM Patient, No Pcp PerNo ref. provider found   Principle Diagnosis: 52 year old man with:  1. Lymphoplasmacytic lymphoma (Waldenstrm's macroglobulinemia) presented with IgM about 3 g/dL and M-spike about 2 g/dL in 2014 and has been in remission after treatment. .  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  Previous therapy:   Chemotherapy with Bendamustine and Rituxan on 02/14/13. He is S/P cycle 6 on 07/04/2013 with CR.   Status post IVIG completed on 10/20/2017.  Current therapy:   Prednisone 80 mg daily started on September 26, 2017.  He is currently on ongoing taper he is taking 20 mg daily.  Rituximab weekly status post first week of treatment on December 12, 2017.  Interim History:  Edward Clarke is here for a follow-up.  Since the last visit he received the first treatment and tolerated it well.  He denies any nausea, fatigue or arthralgias.  He denies any infusion related complications.  He continues to have issues with prednisone taper including mood disturbances and tiredness at times.  His blood sugar remained elevated at times but overall controlled.  He denies any active bleeding including hematochezia or melena.  He denies any bruising.  He denies any ecchymosis or petechiae.  He does not report any headaches, blurry vision, syncope or seizures. Does not report any fevers, chills or sweats.  Does not report any cough, wheezing or hemoptysis.  Does not report any chest pain, palpitation, orthopnea or leg edema.  Does not report any nausea, vomiting or abdominal pain.  She does not report any constipation or diarrhea.  Does not report any bone pain or pathological fractures.   Does not report frequency, urgency or hematuria.   Does not report any lymphadenopathy.  Does not report any anxiety or depression.  Remaining review of systems is negative.  .  Medications: I have reviewed the patient's  current medications.  Current Outpatient Medications  Medication Sig Dispense Refill  . atorvastatin (LIPITOR) 10 MG tablet Take 1 tablet (10 mg total) by mouth daily. 30 tablet 3  . glucose blood (ONETOUCH VERIO) test strip USE ONE STRIP TO CHECK GLUCOSE 4 TIMES DAILY Dx code E11.65 400 each 0  . ibuprofen (ADVIL,MOTRIN) 200 MG tablet Take 400-800 mg by mouth every 8 (eight) hours as needed for headache. Reported on 02/18/2016    . Insulin Glargine (TOUJEO SOLOSTAR) 300 UNIT/ML SOPN Inject 50 Units into the skin daily. 5 pen 2  . Insulin Glulisine (APIDRA SOLOSTAR) 100 UNIT/ML Solostar Pen Inject 20 Units into the skin 2 (two) times daily. 5 pen 2  . Insulin Pen Needle 31G X 5 MM MISC Use to inject insulin once daily DX code E11.65 90 each 3  . lisinopril (PRINIVIL,ZESTRIL) 20 MG tablet TAKE 1 TABLET BY MOUTH ONCE DAILY 90 tablet 3  . metFORMIN (GLUCOPHAGE) 1000 MG tablet Take 1 tablet (1,000 mg total) by mouth 2 (two) times daily with a meal. 60 tablet 3  . predniSONE (DELTASONE) 20 MG tablet Take as directed. 45 tablet 0   No current facility-administered medications for this visit.      Allergies:  Allergies  Allergen Reactions  . Benazepril Other (See Comments)    Reaction unknown - may have been headaches    Past Medical History, Surgical history, Social history, and Family History reviewed today.    Physical Exam: Blood pressure (!) 145/88, pulse 67, temperature 97.7 F (36.5 C), temperature source  Oral, resp. rate (!) 22, height 6\' 2"  (1.88 m), weight (!) 310 lb 4.8 oz (140.8 kg), SpO2 100 %.    ECOG: 1 General appearance: Alert, awake gentleman appeared comfortable. Head: Atraumatic without abnormalities. Oropharynx: No ulcers or thrush. Eyes: Pupils are equal and round reactive to light. Lymph nodes: No lymphadenopathy palpated today in the cervical, axillary or supraclavicular regions. Heart: Regular rate without murmurs rubs or gallops. Lung: Clear without any  rhonchi, wheezes or dullness to percussion. Abdomen: Soft, no rebound or guarding.  Good bowel sounds auscultated. Skin: No ecchymosis or petechiae. Neurological: No deficits noted on exam including motor, sensory or deep tendon reflex.  Fluent speech and normal gait.   Lab Results: Lab Results  Component Value Date   WBC 7.4 12/18/2017   HGB 13.8 09/26/2017   HCT 39.4 12/18/2017   MCV 85.8 12/18/2017   PLT 45 (L) 12/18/2017     Chemistry      Component Value Date/Time   NA 137 09/26/2017 1433   NA 139 01/14/2015 1003   K 5.0 09/26/2017 1433   K 4.3 01/14/2015 1003   CL 104 09/26/2017 1433   CL 101 02/14/2013 0758   CO2 26 09/26/2017 1433   CO2 27 01/14/2015 1003   BUN 14 09/26/2017 1433   BUN 8.1 01/14/2015 1003   CREATININE 1.53 (H) 09/26/2017 1433   CREATININE 1.2 01/14/2015 1003      Component Value Date/Time   CALCIUM 10.1 09/26/2017 1433   CALCIUM 9.0 01/14/2015 1003   ALKPHOS 58 09/26/2017 1433   ALKPHOS 80 01/14/2015 1003   AST 17 09/26/2017 1433   AST 19 01/14/2015 1003   ALT 15 09/26/2017 1433   ALT 21 01/14/2015 1003   BILITOT 0.5 09/26/2017 1433   BILITOT 0.44 01/14/2015 1003         Impression and Plan:  52 year old man with:  1. 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  He had a quick relapse after prednisone therapy and currently receiving prednisone taper with his prednisone dose of 20 mg daily.  He is receiving rituximab weekly without complications so far.  Risks and benefits of continuing rituximab was discussed today and is agreeable to continue.  He will receive week 2 on 12/19/2017 and will complete 4 treatments on January 02, 2018.  His CBC was personally reviewed today and showed improvement in his platelet count.  We will continue to monitor his counts weekly.  I see no need for transfusion or growth factor support. 2. Lymphoplasmacytic lymphoma diagnosed in 2014 and remains in remission after receiving definitive therapy at the time.  Protein  studies will continue to be monitored periodically.  3.  Diabetes: Blood sugar h monitored by Dr. Dwyane Dee and has a follow-up in the near future.  4.  Follow-up: Will be weekly for rituximab treatment and 5 and MD follow-up after completing his course of rituximab.  25  minutes was spent with the patient face-to-face today.  More than 50% of time was dedicated to patient counseling, education and answering questions regarding his future plan of care.    Zola Button 4/15/20199:08 AM

## 2017-12-19 ENCOUNTER — Inpatient Hospital Stay: Payer: Medicare Other

## 2017-12-19 ENCOUNTER — Other Ambulatory Visit: Payer: Medicare Other

## 2017-12-19 VITALS — BP 129/76 | HR 70 | Temp 98.8°F | Resp 18

## 2017-12-19 DIAGNOSIS — Z794 Long term (current) use of insulin: Secondary | ICD-10-CM | POA: Diagnosis not present

## 2017-12-19 DIAGNOSIS — Z79899 Other long term (current) drug therapy: Secondary | ICD-10-CM | POA: Diagnosis not present

## 2017-12-19 DIAGNOSIS — E119 Type 2 diabetes mellitus without complications: Secondary | ICD-10-CM | POA: Diagnosis not present

## 2017-12-19 DIAGNOSIS — D693 Immune thrombocytopenic purpura: Secondary | ICD-10-CM

## 2017-12-19 DIAGNOSIS — Z8572 Personal history of non-Hodgkin lymphomas: Secondary | ICD-10-CM | POA: Diagnosis not present

## 2017-12-19 DIAGNOSIS — Z5112 Encounter for antineoplastic immunotherapy: Secondary | ICD-10-CM | POA: Diagnosis not present

## 2017-12-19 MED ORDER — RITUXIMAB CHEMO INJECTION 500 MG/50ML
375.0000 mg/m2 | Freq: Once | INTRAVENOUS | Status: AC
  Administered 2017-12-19: 1000 mg via INTRAVENOUS
  Filled 2017-12-19: qty 100

## 2017-12-19 MED ORDER — DIPHENHYDRAMINE HCL 25 MG PO CAPS
ORAL_CAPSULE | ORAL | Status: AC
  Filled 2017-12-19: qty 2

## 2017-12-19 MED ORDER — DIPHENHYDRAMINE HCL 25 MG PO CAPS
50.0000 mg | ORAL_CAPSULE | Freq: Once | ORAL | Status: AC
  Administered 2017-12-19: 50 mg via ORAL

## 2017-12-19 MED ORDER — SODIUM CHLORIDE 0.9 % IV SOLN
Freq: Once | INTRAVENOUS | Status: AC
  Administered 2017-12-19: 10:00:00 via INTRAVENOUS

## 2017-12-19 MED ORDER — ACETAMINOPHEN 325 MG PO TABS
650.0000 mg | ORAL_TABLET | Freq: Once | ORAL | Status: AC
  Administered 2017-12-19: 650 mg via ORAL

## 2017-12-19 MED ORDER — ACETAMINOPHEN 325 MG PO TABS
ORAL_TABLET | ORAL | Status: AC
  Filled 2017-12-19: qty 2

## 2017-12-19 NOTE — Patient Instructions (Signed)
[facility] Discharge Instructions for Patients Receiving Chemotherapy  Today you received the following chemotherapy agent:  Rituxan  To help prevent nausea and vomiting after your treatment, we encourage you to take your nausea medication as prescribed.   If you develop nausea and vomiting that is not controlled by your nausea medication, call the clinic.   BELOW ARE SYMPTOMS THAT SHOULD BE REPORTED IMMEDIATELY:  *FEVER GREATER THAN 100.5 F  *CHILLS WITH OR WITHOUT FEVER  NAUSEA AND VOMITING THAT IS NOT CONTROLLED WITH YOUR NAUSEA MEDICATION  *UNUSUAL SHORTNESS OF BREATH  *UNUSUAL BRUISING OR BLEEDING  TENDERNESS IN MOUTH AND THROAT WITH OR WITHOUT PRESENCE OF ULCERS  *URINARY PROBLEMS  *BOWEL PROBLEMS  UNUSUAL RASH Items with * indicate a potential emergency and should be followed up as soon as possible.  Feel free to call the clinic should you have any questions or concerns. The clinic phone number is [phone].  Please show the CHEMO ALERT CARD at check-in to the Emergency Department and triage nurse.   

## 2017-12-20 ENCOUNTER — Ambulatory Visit (INDEPENDENT_AMBULATORY_CARE_PROVIDER_SITE_OTHER): Payer: Medicare Other | Admitting: Endocrinology

## 2017-12-20 ENCOUNTER — Ambulatory Visit: Payer: Medicare Other | Admitting: Internal Medicine

## 2017-12-20 ENCOUNTER — Telehealth: Payer: Self-pay

## 2017-12-20 ENCOUNTER — Encounter: Payer: Self-pay | Admitting: Endocrinology

## 2017-12-20 ENCOUNTER — Encounter: Payer: Self-pay | Admitting: Oncology

## 2017-12-20 VITALS — BP 136/80 | HR 76 | Ht 74.0 in | Wt 311.6 lb

## 2017-12-20 DIAGNOSIS — Z794 Long term (current) use of insulin: Secondary | ICD-10-CM | POA: Diagnosis not present

## 2017-12-20 DIAGNOSIS — E1165 Type 2 diabetes mellitus with hyperglycemia: Secondary | ICD-10-CM

## 2017-12-20 LAB — POCT GLYCOSYLATED HEMOGLOBIN (HGB A1C): HEMOGLOBIN A1C: 8.7

## 2017-12-20 MED ORDER — ATORVASTATIN CALCIUM 20 MG PO TABS: 20.0000 mg | ORAL_TABLET | Freq: Every day | ORAL | 3 refills | Status: DC

## 2017-12-20 NOTE — Patient Instructions (Addendum)
Check blood sugars on waking up  4/7 days  Also check blood sugars about 2 hours after a meal and do this after different meals by rotation  Recommended blood sugar levels on waking up is 90-130 and about 2 hours after meal is 130-160  Please bring your blood sugar monitor to each visit, thank you  Take 30 units with each meal, and more if high

## 2017-12-20 NOTE — Progress Notes (Signed)
Patient ID: Edward Clarke, male   DOB: [birth year], 52 y.o.   MRN: [id number]   Reason for Appointment: Diabetes follow-up   History of Present Illness    Diagnosis: Type 2 diabetes mellitus, date of diagnosis: 2005.   He has been on basal bolus insulin regimen in the last few years along with metformin Most of the difficulty with control is related to his noncompliance with diet, medications, glucose monitoring and financial issues Frequently will had been running out of one or the other medication or insulin  and blood sugars would not be well controlled  Recent history:   INSULIN: Toujeo units 50 units once daily ; Apidra insulin 20-30 units before meals  His A1c was last 7.2 and is now 8.7   Current management, blood sugar patterns and problems:    Despite his being on high-dose prednisone since late January and having very high blood sugars he did not called to report problems with his blood sugars  Although he has been able to get his Toujeo and ability to consistently through patient assistance he is not checking his blood sugars enough and still continues to have very high blood sugars especially later in the evening despite his prednisone dose now being 20 mg  Although he has mostly high fasting reading he still has a couple of near normal readings in the morning also, may be taking extra insulin the night before to correct the high readings  He does not increase his blood sugars in proportion to his blood sugars especially since he is not checking blood sugar before eating consistently  More recently has been less active and less motivated to watch his diet or control his portions  Overall has gained a little weight  He does take his metformin fairly regularly   Glucometer: One Touch Verio.  Checking blood sugar about once a day  Blood Glucose readings by download analysis:  Mean values apply above for all meters except median for One Touch  PRE-MEAL  Fasting  midday Dinner  evening Overall  Glucose range:  96-232    199-529  72-529  Mean/median:  170  149   270  217    HYPOGLYCEMIA:  None Treatment oh hypoglycemia: Usually coffee creamer or food.  Does have glucose tablets at home    Meals: Usually 2 meals a day ( lunch 12-1 PM, supper 9 pm)   Physical activity: exercise: Very little recently   Wt Readings from Last 3 Encounters:  12/20/17 (!) 311 lb 9.6 oz (141.3 kg)  12/18/17 (!) 310 lb 4.8 oz (140.8 kg)  11/13/17 (!) 305 lb 1.6 oz (138.4 kg)     The HbgA1c has been as high as 11.6 previously .  Lab Results  Component Value Date   HGBA1C 8.7 12/20/2017   HGBA1C 7.2 (H) 07/11/2017   HGBA1C 6.8 04/12/2017   Lab Results  Component Value Date   MICROALBUR <0.7 09/20/2017   LDLCALC 162 (H) 09/20/2017   CREATININE 1.40 (H) 12/18/2017    Lab Results  Component Value Date   FRUCTOSAMINE 317 (H) 09/20/2017   FRUCTOSAMINE 337 (H) 01/02/2017   FRUCTOSAMINE 307 (H) 11/13/2014      Allergies as of 12/20/2017      Reactions   Benazepril Other (See Comments)   Reaction unknown - may have been headaches      Medication List        Accurate as of 12/20/17  8:46 PM. Always use your most recent med  list.          atorvastatin 20 MG tablet Commonly known as:  LIPITOR Take 1 tablet (20 mg total) by mouth daily.   glucose blood test strip Commonly known as:  ONETOUCH VERIO USE ONE STRIP TO CHECK GLUCOSE 4 TIMES DAILY Dx code E11.65   ibuprofen 200 MG tablet Commonly known as:  ADVIL,MOTRIN Take 400-800 mg by mouth every 8 (eight) hours as needed for headache. Reported on 02/18/2016   Insulin Glargine 300 UNIT/ML Sopn Commonly known as:  TOUJEO SOLOSTAR Inject 50 Units into the skin daily.   Insulin Glulisine 100 UNIT/ML Solostar Pen Commonly known as:  APIDRA SOLOSTAR Inject 20 Units into the skin 2 (two) times daily.   Insulin Pen Needle 31G X 5 MM Misc Use to inject insulin once daily DX code E11.65     lisinopril 20 MG tablet Commonly known as:  PRINIVIL,ZESTRIL TAKE 1 TABLET BY MOUTH ONCE DAILY   metFORMIN 1000 MG tablet Commonly known as:  GLUCOPHAGE Take 1 tablet (1,000 mg total) by mouth 2 (two) times daily with a meal.   predniSONE 20 MG tablet Commonly known as:  DELTASONE Take as directed.       Allergies:  Allergies  Allergen Reactions  . Benazepril Other (See Comments)    Reaction unknown - may have been headaches    Past Medical History:  Diagnosis Date  . Diabetes mellitus   . Hyperlipidemia   . Hypertension   . Monoclonal paraproteinemia   . Obesity     Past Surgical History:  Procedure Laterality Date  . APPENDECTOMY    . EYE SURGERY    . TONSILLECTOMY      Family History  Problem Relation Age of Onset  . Diabetes Sister     Social History:  reports that he has quit smoking. His smoking use included cigarettes. He has never used smokeless tobacco. He reports that he drinks about 1.2 oz of alcohol per week. He reports that he has current or past drug history. Drug: Marijuana. Frequency: 7.00 times per week.  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 and platelet count is now up to 45,000   HYPERLIPIDEMIA:  The lipid abnormality consists of elevated LDL treated with Lipitor  LDL is higher on the last visit with 10 mg dosage  Lab Results  Component Value Date   CHOL 235 (H) 09/20/2017   HDL 40.00 09/20/2017   LDLCALC 162 (H) 09/20/2017   LDLDIRECT 174.0 05/27/2016   TRIG 163.0 (H) 09/20/2017   CHOLHDL 6 09/20/2017   Renal dysfunction: His creatinine was slightly better   Lab Results  Component Value Date   CREATININE 1.40 (H) 12/18/2017   CREATININE 1.53 (H) 09/26/2017   CREATININE 1.42 09/20/2017    He does not get his eyes checked because of cost of the exam    Examination:   BP  136/80 (BP Location: Right Arm, Patient Position: Sitting, Cuff Size: Large)   Pulse 76   Ht 6\' 2"  (1.88 m)   Wt (!) 311 lb 9.6 oz (141.3 kg)   SpO2 96%   BMI 40.01 kg/m   Body mass index is 40.01 kg/m.      Assesment/Plan:   1. Diabetes type 2,  with morbid obesity  See history of present illness for current blood sugar patterns, management  and problems identified  His blood sugars are quite out of control as discussed above because of his being on prednisone He is really not paying attention to his blood sugars are adjusting insulin appropriately or aggressively enough while on prednisone Also not exercising or watching his diet consistently  RECOMMENDATIONS:  He needs to check his blood sugars at least twice a day while he is on prednisone and take it before each meal and also some after meals  He would need to take extra 5-10 units of Apidra if the blood sugars are high before eating  Discussed blood sugar targets  Also may take as much is 35 units at suppertime if he is finding his readings after supper are consistently high  No change in Toujeo at this time unless fasting readings are consistently high, also reminded him that he has to take his blood sugars on waking up at least every other day  If his blood sugars go up significantly with drinking his coffee in the morning he will need to take 5-10 units of Apidra with diet also  He does need to start exercising regularly to help insulin resistance and his tendency to weight gain  No change of metformin since kidney function is adequate  Again consider adding Invokana with patient assistance program if blood sugars are not improving and if kidney function is further improved    2. HYPERTENSION: Blood pressure is controlled recently and he will stay on lisinopril  3. Hyperlipidemia: On the last measurement lipids were not controlled, he thinks he is taking Lipitor regularly    Will try Lipitor 20 mg instead of 10  mg, new prescription sent      Patient Instructions  Check blood sugars on waking up  4/7 days  Also check blood sugars about 2 hours after a meal and do this after different meals by rotation  Recommended blood sugar levels on waking up is 90-130 and about 2 hours after meal is 130-160  Please bring your blood sugar monitor to each visit, thank you  Take 30 units with each meal, and more if high    Counseling time on subjects discussed in assessment and plan sections is over 50% of today's 25 minute visit     Elayne Snare 12/20/2017, 8:46 PM   Note: This office note was prepared with Dragon voice recognition system technology. Any transcriptional errors that result from this process are unintentional.

## 2017-12-20 NOTE — Telephone Encounter (Signed)
Spoke with pt regarding Toujeo and Apidra medications. Pt was made aware that medication was at office awaiting his pickup. Pt stated that he would be here by Friday to pick up medication.

## 2017-12-26 ENCOUNTER — Inpatient Hospital Stay: Payer: Medicare Other

## 2017-12-26 VITALS — BP 141/73 | HR 84 | Temp 98.4°F | Resp 18

## 2017-12-26 DIAGNOSIS — E119 Type 2 diabetes mellitus without complications: Secondary | ICD-10-CM | POA: Diagnosis not present

## 2017-12-26 DIAGNOSIS — Z794 Long term (current) use of insulin: Secondary | ICD-10-CM | POA: Diagnosis not present

## 2017-12-26 DIAGNOSIS — Z79899 Other long term (current) drug therapy: Secondary | ICD-10-CM | POA: Diagnosis not present

## 2017-12-26 DIAGNOSIS — Z5112 Encounter for antineoplastic immunotherapy: Secondary | ICD-10-CM | POA: Diagnosis not present

## 2017-12-26 DIAGNOSIS — Z8572 Personal history of non-Hodgkin lymphomas: Secondary | ICD-10-CM | POA: Diagnosis not present

## 2017-12-26 DIAGNOSIS — D693 Immune thrombocytopenic purpura: Secondary | ICD-10-CM

## 2017-12-26 DIAGNOSIS — D696 Thrombocytopenia, unspecified: Secondary | ICD-10-CM

## 2017-12-26 LAB — CMP (CANCER CENTER ONLY)
ALT: 28 U/L (ref 0–55)
AST: 21 U/L (ref 5–34)
Albumin: 3.9 g/dL (ref 3.5–5.0)
Alkaline Phosphatase: 46 U/L (ref 40–150)
Anion gap: 11 (ref 3–11)
BUN: 16 mg/dL (ref 7–26)
CO2: 24 mmol/L (ref 22–29)
Calcium: 9.8 mg/dL (ref 8.4–10.4)
Chloride: 103 mmol/L (ref 98–109)
Creatinine: 1.52 mg/dL — ABNORMAL HIGH (ref 0.70–1.30)
GFR, Est AFR Am: 59 mL/min — ABNORMAL LOW (ref >60)
GFR, Estimated: 51 mL/min — ABNORMAL LOW (ref >60)
Glucose, Bld: 145 mg/dL — ABNORMAL HIGH (ref 70–140)
Potassium: 4.3 mmol/L (ref 3.5–5.1)
Sodium: 138 mmol/L (ref 136–145)
Total Bilirubin: 0.5 mg/dL (ref 0.2–1.2)
Total Protein: 6.7 g/dL (ref 6.4–8.3)

## 2017-12-26 LAB — CBC WITH DIFFERENTIAL (CANCER CENTER ONLY)
Basophils Absolute: 0.1 K/uL (ref 0.0–0.1)
Basophils Relative: 1 %
Eosinophils Absolute: 0.1 K/uL (ref 0.0–0.5)
Eosinophils Relative: 2 %
HCT: 42.5 % (ref 38.4–49.9)
Hemoglobin: 14.2 g/dL (ref 13.0–17.1)
Lymphocytes Relative: 23 %
Lymphs Abs: 1.4 K/uL (ref 0.9–3.3)
MCH: 29.1 pg (ref 27.2–33.4)
MCHC: 33.4 g/dL (ref 32.0–36.0)
MCV: 87.2 fL (ref 79.3–98.0)
Monocytes Absolute: 0.5 K/uL (ref 0.1–0.9)
Monocytes Relative: 9 %
Neutro Abs: 4 K/uL (ref 1.5–6.5)
Neutrophils Relative %: 65 %
Platelet Count: 25 K/uL — ABNORMAL LOW (ref 140–400)
RBC: 4.87 MIL/uL (ref 4.20–5.82)
RDW: 14.9 % — ABNORMAL HIGH (ref 11.0–14.6)
WBC Count: 6.2 K/uL (ref 4.0–10.3)

## 2017-12-26 MED ORDER — DIPHENHYDRAMINE HCL 25 MG PO CAPS
50.0000 mg | ORAL_CAPSULE | Freq: Once | ORAL | Status: AC
  Administered 2017-12-26: 50 mg via ORAL

## 2017-12-26 MED ORDER — SODIUM CHLORIDE 0.9 % IV SOLN
Freq: Once | INTRAVENOUS | Status: AC
  Administered 2017-12-26: 13:00:00 via INTRAVENOUS

## 2017-12-26 MED ORDER — DIPHENHYDRAMINE HCL 25 MG PO CAPS
ORAL_CAPSULE | ORAL | Status: AC
  Filled 2017-12-26: qty 2

## 2017-12-26 MED ORDER — ACETAMINOPHEN 325 MG PO TABS
650.0000 mg | ORAL_TABLET | Freq: Once | ORAL | Status: AC
  Administered 2017-12-26: 650 mg via ORAL

## 2017-12-26 MED ORDER — ACETAMINOPHEN 325 MG PO TABS
ORAL_TABLET | ORAL | Status: AC
  Filled 2017-12-26: qty 2

## 2017-12-26 MED ORDER — SODIUM CHLORIDE 0.9 % IV SOLN
375.0000 mg/m2 | Freq: Once | INTRAVENOUS | Status: AC
  Administered 2017-12-26: 1000 mg via INTRAVENOUS
  Filled 2017-12-26: qty 100

## 2017-12-26 NOTE — Patient Instructions (Signed)
[facility] Discharge Instructions for Patients Receiving Chemotherapy  Today you received the following chemotherapy agent:  Rituxan  To help prevent nausea and vomiting after your treatment, we encourage you to take your nausea medication as prescribed.   If you develop nausea and vomiting that is not controlled by your nausea medication, call the clinic.   BELOW ARE SYMPTOMS THAT SHOULD BE REPORTED IMMEDIATELY:  *FEVER GREATER THAN 100.5 F  *CHILLS WITH OR WITHOUT FEVER  NAUSEA AND VOMITING THAT IS NOT CONTROLLED WITH YOUR NAUSEA MEDICATION  *UNUSUAL SHORTNESS OF BREATH  *UNUSUAL BRUISING OR BLEEDING  TENDERNESS IN MOUTH AND THROAT WITH OR WITHOUT PRESENCE OF ULCERS  *URINARY PROBLEMS  *BOWEL PROBLEMS  UNUSUAL RASH Items with * indicate a potential emergency and should be followed up as soon as possible.  Feel free to call the clinic should you have any questions or concerns. The clinic phone number is [phone].  Please show the CHEMO ALERT CARD at check-in to the Emergency Department and triage nurse.   

## 2018-01-02 ENCOUNTER — Inpatient Hospital Stay: Payer: Medicare Other

## 2018-01-02 VITALS — BP 127/79 | HR 70 | Temp 98.4°F | Resp 16

## 2018-01-02 DIAGNOSIS — Z79899 Other long term (current) drug therapy: Secondary | ICD-10-CM | POA: Diagnosis not present

## 2018-01-02 DIAGNOSIS — Z8572 Personal history of non-Hodgkin lymphomas: Secondary | ICD-10-CM | POA: Diagnosis not present

## 2018-01-02 DIAGNOSIS — Z5112 Encounter for antineoplastic immunotherapy: Secondary | ICD-10-CM | POA: Diagnosis not present

## 2018-01-02 DIAGNOSIS — D696 Thrombocytopenia, unspecified: Secondary | ICD-10-CM

## 2018-01-02 DIAGNOSIS — Z794 Long term (current) use of insulin: Secondary | ICD-10-CM | POA: Diagnosis not present

## 2018-01-02 DIAGNOSIS — D693 Immune thrombocytopenic purpura: Secondary | ICD-10-CM

## 2018-01-02 DIAGNOSIS — E119 Type 2 diabetes mellitus without complications: Secondary | ICD-10-CM | POA: Diagnosis not present

## 2018-01-02 LAB — CBC WITH DIFFERENTIAL (CANCER CENTER ONLY)
BASOS PCT: 1 %
Basophils Absolute: 0.1 10*3/uL (ref 0.0–0.1)
EOS ABS: 0.2 10*3/uL (ref 0.0–0.5)
EOS PCT: 3 %
HCT: 44.8 % (ref 38.4–49.9)
Hemoglobin: 15 g/dL (ref 13.0–17.1)
Lymphocytes Relative: 18 %
Lymphs Abs: 1 10*3/uL (ref 0.9–3.3)
MCH: 29.2 pg (ref 27.2–33.4)
MCHC: 33.5 g/dL (ref 32.0–36.0)
MCV: 87.2 fL (ref 79.3–98.0)
MONOS PCT: 9 %
Monocytes Absolute: 0.5 10*3/uL (ref 0.1–0.9)
Neutro Abs: 3.9 10*3/uL (ref 1.5–6.5)
Neutrophils Relative %: 69 %
PLATELETS: 32 10*3/uL — AB (ref 140–400)
RBC: 5.13 MIL/uL (ref 4.20–5.82)
RDW: 14.7 % — AB (ref 11.0–14.6)
WBC Count: 5.7 10*3/uL (ref 4.0–10.3)

## 2018-01-02 LAB — CMP (CANCER CENTER ONLY)
ALK PHOS: 47 U/L (ref 40–150)
ALT: 25 U/L (ref 0–55)
AST: 24 U/L (ref 5–34)
Albumin: 4.4 g/dL (ref 3.5–5.0)
Anion gap: 11 (ref 3–11)
BILIRUBIN TOTAL: 0.6 mg/dL (ref 0.2–1.2)
BUN: 24 mg/dL (ref 7–26)
CALCIUM: 10.5 mg/dL — AB (ref 8.4–10.4)
CO2: 21 mmol/L — AB (ref 22–29)
Chloride: 105 mmol/L (ref 98–109)
Creatinine: 1.61 mg/dL — ABNORMAL HIGH (ref 0.70–1.30)
GFR, EST AFRICAN AMERICAN: 55 mL/min — AB (ref >60)
GFR, Estimated: 47 mL/min — ABNORMAL LOW (ref >60)
Glucose, Bld: 130 mg/dL (ref 70–140)
Potassium: 4.9 mmol/L (ref 3.5–5.1)
SODIUM: 137 mmol/L (ref 136–145)
Total Protein: 7.3 g/dL (ref 6.4–8.3)

## 2018-01-02 MED ORDER — SODIUM CHLORIDE 0.9 % IV SOLN
375.0000 mg/m2 | Freq: Once | INTRAVENOUS | Status: AC
  Administered 2018-01-02: 1000 mg via INTRAVENOUS
  Filled 2018-01-02: qty 100

## 2018-01-02 MED ORDER — SODIUM CHLORIDE 0.9 % IV SOLN
Freq: Once | INTRAVENOUS | Status: AC
  Administered 2018-01-02: 12:00:00 via INTRAVENOUS

## 2018-01-02 MED ORDER — DIPHENHYDRAMINE HCL 25 MG PO CAPS
ORAL_CAPSULE | ORAL | Status: AC
  Filled 2018-01-02: qty 2

## 2018-01-02 MED ORDER — ACETAMINOPHEN 325 MG PO TABS
650.0000 mg | ORAL_TABLET | Freq: Once | ORAL | Status: AC
  Administered 2018-01-02: 650 mg via ORAL

## 2018-01-02 MED ORDER — ACETAMINOPHEN 325 MG PO TABS
ORAL_TABLET | ORAL | Status: AC
  Filled 2018-01-02: qty 2

## 2018-01-02 MED ORDER — DIPHENHYDRAMINE HCL 25 MG PO CAPS
50.0000 mg | ORAL_CAPSULE | Freq: Once | ORAL | Status: AC
  Administered 2018-01-02: 50 mg via ORAL

## 2018-01-02 NOTE — Patient Instructions (Signed)
[facility] Discharge Instructions for Patients Receiving Chemotherapy  Today you received the following chemotherapy agents:  Rituxan.  To help prevent nausea and vomiting after your treatment, we encourage you to take your nausea medication as directed.   If you develop nausea and vomiting that is not controlled by your nausea medication, call the clinic.   BELOW ARE SYMPTOMS THAT SHOULD BE REPORTED IMMEDIATELY:  *FEVER GREATER THAN 100.5 F  *CHILLS WITH OR WITHOUT FEVER  NAUSEA AND VOMITING THAT IS NOT CONTROLLED WITH YOUR NAUSEA MEDICATION  *UNUSUAL SHORTNESS OF BREATH  *UNUSUAL BRUISING OR BLEEDING  TENDERNESS IN MOUTH AND THROAT WITH OR WITHOUT PRESENCE OF ULCERS  *URINARY PROBLEMS  *BOWEL PROBLEMS  UNUSUAL RASH Items with * indicate a potential emergency and should be followed up as soon as possible.  Feel free to call the clinic should you have any questions or concerns. The clinic phone number is [phone].  Please show the CHEMO ALERT CARD at check-in to the Emergency Department and triage nurse.   

## 2018-01-08 ENCOUNTER — Inpatient Hospital Stay: Payer: Medicare Other

## 2018-01-08 ENCOUNTER — Inpatient Hospital Stay: Payer: Medicare Other | Attending: Oncology | Admitting: Oncology

## 2018-01-08 ENCOUNTER — Telehealth: Payer: Self-pay | Admitting: Oncology

## 2018-01-08 VITALS — BP 123/75 | HR 73 | Temp 98.8°F | Resp 20 | Ht 74.0 in | Wt 312.4 lb

## 2018-01-08 DIAGNOSIS — E119 Type 2 diabetes mellitus without complications: Secondary | ICD-10-CM | POA: Diagnosis not present

## 2018-01-08 DIAGNOSIS — R5383 Other fatigue: Secondary | ICD-10-CM | POA: Insufficient documentation

## 2018-01-08 DIAGNOSIS — Z8572 Personal history of non-Hodgkin lymphomas: Secondary | ICD-10-CM | POA: Insufficient documentation

## 2018-01-08 DIAGNOSIS — Z7952 Long term (current) use of systemic steroids: Secondary | ICD-10-CM | POA: Insufficient documentation

## 2018-01-08 DIAGNOSIS — Z794 Long term (current) use of insulin: Secondary | ICD-10-CM | POA: Insufficient documentation

## 2018-01-08 DIAGNOSIS — Z79899 Other long term (current) drug therapy: Secondary | ICD-10-CM | POA: Diagnosis not present

## 2018-01-08 DIAGNOSIS — D693 Immune thrombocytopenic purpura: Secondary | ICD-10-CM | POA: Diagnosis not present

## 2018-01-08 DIAGNOSIS — Z9221 Personal history of antineoplastic chemotherapy: Secondary | ICD-10-CM | POA: Insufficient documentation

## 2018-01-08 DIAGNOSIS — D696 Thrombocytopenia, unspecified: Secondary | ICD-10-CM

## 2018-01-08 LAB — CMP (CANCER CENTER ONLY)
ALT: 22 U/L (ref 0–55)
ANION GAP: 4 (ref 3–11)
AST: 22 U/L (ref 5–34)
Albumin: 3.9 g/dL (ref 3.5–5.0)
Alkaline Phosphatase: 44 U/L (ref 40–150)
BUN: 15 mg/dL (ref 7–26)
CHLORIDE: 106 mmol/L (ref 98–109)
CO2: 25 mmol/L (ref 22–29)
Calcium: 9.5 mg/dL (ref 8.4–10.4)
Creatinine: 1.47 mg/dL — ABNORMAL HIGH (ref 0.70–1.30)
GFR, EST NON AFRICAN AMERICAN: 53 mL/min — AB (ref >60)
GFR, Est AFR Am: >60 mL/min (ref >60)
Glucose, Bld: 137 mg/dL (ref 70–140)
POTASSIUM: 5 mmol/L (ref 3.5–5.1)
Sodium: 135 mmol/L — ABNORMAL LOW (ref 136–145)
Total Bilirubin: 0.3 mg/dL (ref 0.2–1.2)
Total Protein: 6.4 g/dL (ref 6.4–8.3)

## 2018-01-08 LAB — CBC WITH DIFFERENTIAL (CANCER CENTER ONLY)
BASOS ABS: 0 10*3/uL (ref 0.0–0.1)
Basophils Relative: 0 %
EOS PCT: 5 %
Eosinophils Absolute: 0.2 10*3/uL (ref 0.0–0.5)
HCT: 39.8 % (ref 38.4–49.9)
Hemoglobin: 13.3 g/dL (ref 13.0–17.1)
LYMPHS ABS: 0.7 10*3/uL — AB (ref 0.9–3.3)
LYMPHS PCT: 18 %
MCH: 29.2 pg (ref 27.2–33.4)
MCHC: 33.5 g/dL (ref 32.0–36.0)
MCV: 87 fL (ref 79.3–98.0)
MONO ABS: 0.6 10*3/uL (ref 0.1–0.9)
Monocytes Relative: 14 %
Neutro Abs: 2.6 10*3/uL (ref 1.5–6.5)
Neutrophils Relative %: 63 %
PLATELETS: 25 10*3/uL — AB (ref 140–400)
RBC: 4.57 MIL/uL (ref 4.20–5.82)
RDW: 14.7 % — AB (ref 11.0–14.6)
WBC Count: 4.1 10*3/uL (ref 4.0–10.3)

## 2018-01-08 NOTE — Telephone Encounter (Signed)
Gave patient avs report and appointments for May and June. [facility] referral pushed to [facility] system. [facility] will contact patient.

## 2018-01-08 NOTE — Progress Notes (Signed)
Hematology and Oncology Follow Up Visit  Edward Clarke [id number] 01-27-1966 52 y.o. 01/08/2018 9:54 AM Patient, No Pcp PerNo ref. provider found   Principle Diagnosis: 52 year old man with:  1. Lymphoplasmacytic lymphoma (Waldenstrm's macroglobulinemia) diagnosed in 2014 after presenting with IgM about 3 g/dL and M-spike about 2 g/dL his disease remains in remission.  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  Previous therapy:   Chemotherapy with Bendamustine and Rituxan on 02/14/13. He is S/P cycle 6 on 07/04/2013 with CR.   Status post IVIG completed on 10/20/2017.  Prednisone 80 mg daily started on September 26, 2017.  He is completely tapered off in April 2019.  Rituximab weekly for 4 weeks started on December 12, 2017.  Current therapy:   Active surveillance only  Interim History:  Edward Clarke presents today for a follow-up.  Since last visit, he is completely off prednisone and recovering reasonably well at this time.  He also tolerated rituximab infusion without other issues.  He denies any infusion related complications, nausea or vomiting.  He did report some mild fatigue.  He has regained all activities of daily living without any decline.  He denies any bleeding episodes.  He denies any hematochezia or melena.  He does report bruising very infrequently.  He does not report any headaches, blurry vision, syncope or seizures.  He denies any alteration in mental status or neuropathy.  Does not report any fevers, chills or sweats.  Does not report any cough, wheezing or hemoptysis.  Does not report any chest pain, palpitation, orthopnea or leg edema.  Does not report any nausea, vomiting or abdominal pain.  She does not report any constipation or diarrhea.    Denies any bone pain.  Does not report frequency, urgency or hematuria.   Does not report any lymphadenopathy.  Does not report any anxiety or depression.  Remaining review of systems is negative.  .  Medications: I have reviewed the  patient's current medications.  Current Outpatient Medications  Medication Sig Dispense Refill  . atorvastatin (LIPITOR) 20 MG tablet Take 1 tablet (20 mg total) by mouth daily. 30 tablet 3  . glucose blood (ONETOUCH VERIO) test strip USE ONE STRIP TO CHECK GLUCOSE 4 TIMES DAILY Dx code E11.65 400 each 0  . ibuprofen (ADVIL,MOTRIN) 200 MG tablet Take 400-800 mg by mouth every 8 (eight) hours as needed for headache. Reported on 02/18/2016    . Insulin Glargine (TOUJEO SOLOSTAR) 300 UNIT/ML SOPN Inject 50 Units into the skin daily. 5 pen 2  . Insulin Glulisine (APIDRA SOLOSTAR) 100 UNIT/ML Solostar Pen Inject 20 Units into the skin 2 (two) times daily. 5 pen 2  . Insulin Pen Needle 31G X 5 MM MISC Use to inject insulin once daily DX code E11.65 90 each 3  . lisinopril (PRINIVIL,ZESTRIL) 20 MG tablet TAKE 1 TABLET BY MOUTH ONCE DAILY 90 tablet 3  . metFORMIN (GLUCOPHAGE) 1000 MG tablet Take 1 tablet (1,000 mg total) by mouth 2 (two) times daily with a meal. 60 tablet 3  . predniSONE (DELTASONE) 20 MG tablet Take as directed. 45 tablet 0   No current facility-administered medications for this visit.      Allergies:  Allergies  Allergen Reactions  . Benazepril Other (See Comments)    Reaction unknown - may have been headaches    Past Medical History, Surgical history, Social history, and Family History reviewed today.    Physical Exam: Blood pressure 123/75, pulse 73, temperature 98.8 F (37.1 C), temperature source Oral,  resp. rate 20, height 6\' 2"  (1.88 m), weight (!) 312 lb 6.4 oz (141.7 kg), SpO2 98 %.    ECOG: 1 General appearance: Well-appearing gentleman without distress. Head: Cephalic without abnormalities. Oropharynx: No oral ulcers or lesions. Eyes: Sclera anicteric. Lymph nodes: No cervical, axillary or supraclavicular regions lymphadenopathy. Heart: Regular rate without any murmurs or gallops. Lung: Clear without any rhonchi, wheezes or dullness to  percussion. Abdomen: Soft, nondistended without any rebound or guarding.  No splenomegaly. Skin: No petechiae noted.  Very faint ecchymosis noted on his arms. Neurological: No other, sensory deficits.  Intact deep tendon reflexes. Psychiatric: Appropriate mood and affect.   Lab Results: Lab Results  Component Value Date   WBC 4.1 01/08/2018   HGB 13.3 01/08/2018   HCT 39.8 01/08/2018   MCV 87.0 01/08/2018   PLT 25 (L) 01/08/2018     Chemistry      Component Value Date/Time   NA 135 (L) 01/08/2018 0902   NA 139 01/14/2015 1003   K 5.0 01/08/2018 0902   K 4.3 01/14/2015 1003   CL 106 01/08/2018 0902   CL 101 02/14/2013 0758   CO2 25 01/08/2018 0902   CO2 27 01/14/2015 1003   BUN 15 01/08/2018 0902   BUN 8.1 01/14/2015 1003   CREATININE 1.47 (H) 01/08/2018 0902   CREATININE 1.2 01/14/2015 1003      Component Value Date/Time   CALCIUM 9.5 01/08/2018 0902   CALCIUM 9.0 01/14/2015 1003   ALKPHOS 44 01/08/2018 0902   ALKPHOS 80 01/14/2015 1003   AST 22 01/08/2018 0902   AST 19 01/14/2015 1003   ALT 22 01/08/2018 0902   ALT 21 01/14/2015 1003   BILITOT 0.3 01/08/2018 0902   BILITOT 0.44 01/14/2015 1003         Impression and Plan:  52 year old man with:  1. 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  Presented with platelet count of 1000 and had a transient response to prednisone and IVIG.  He completed 4 weeks of rituximab and his response remains marginal.  The natural course of this disease was reviewed again today with the patient and his wife.  Treatment options were reviewed at this time which include continued observation and surveillance and await the full response of rituximab, evaluation for splenectomy or platelet stimulating agents.  Risks and benefits of all these approaches her weight today and after discussion, we will proceed with general surgery consultation for possible splenectomy in the meantime we will continue to monitor his platelet closely.  If his  platelets rise above 50,000, we will hold off on splenectomy at this time.  Clearly if his platelets drop then splenectomy would be his best option.   2. Lymphoplasmacytic lymphoma that has been in remission since 2014.  See no evidence of relapse at this time.  3.  Diabetes: Continues to be under reasonable control despite being on prednisone.  Prednisone taper off which helped his blood sugar at this time.  4.  Follow-up: Will be weekly labs and will have a an MD follow-up in 4 weeks.  25  minutes was spent with the patient face-to-face today.  More than 50% of time was dedicated to patient counseling, education and discussing the natural course of this disease as well as future treatment options.  Lynne Leader 5/6/20199:54 AM

## 2018-01-15 ENCOUNTER — Inpatient Hospital Stay: Payer: Medicare Other

## 2018-01-15 DIAGNOSIS — Z79899 Other long term (current) drug therapy: Secondary | ICD-10-CM | POA: Diagnosis not present

## 2018-01-15 DIAGNOSIS — D693 Immune thrombocytopenic purpura: Secondary | ICD-10-CM | POA: Diagnosis not present

## 2018-01-15 DIAGNOSIS — E119 Type 2 diabetes mellitus without complications: Secondary | ICD-10-CM | POA: Diagnosis not present

## 2018-01-15 DIAGNOSIS — Z8572 Personal history of non-Hodgkin lymphomas: Secondary | ICD-10-CM | POA: Diagnosis not present

## 2018-01-15 DIAGNOSIS — D696 Thrombocytopenia, unspecified: Secondary | ICD-10-CM

## 2018-01-15 DIAGNOSIS — Z794 Long term (current) use of insulin: Secondary | ICD-10-CM | POA: Diagnosis not present

## 2018-01-15 DIAGNOSIS — R5383 Other fatigue: Secondary | ICD-10-CM | POA: Diagnosis not present

## 2018-01-15 LAB — CBC WITH DIFFERENTIAL (CANCER CENTER ONLY)
Basophils Absolute: 0 10*3/uL (ref 0.0–0.1)
Basophils Relative: 0 %
EOS ABS: 0.2 10*3/uL (ref 0.0–0.5)
Eosinophils Relative: 4 %
HCT: 39.1 % (ref 38.4–49.9)
HEMOGLOBIN: 13.7 g/dL (ref 13.0–17.1)
LYMPHS ABS: 1.1 10*3/uL (ref 0.9–3.3)
Lymphocytes Relative: 22 %
MCH: 30.1 pg (ref 27.2–33.4)
MCHC: 35 g/dL (ref 32.0–36.0)
MCV: 85.9 fL (ref 79.3–98.0)
MONOS PCT: 12 %
Monocytes Absolute: 0.6 10*3/uL (ref 0.1–0.9)
Neutro Abs: 3.1 10*3/uL (ref 1.5–6.5)
Neutrophils Relative %: 62 %
Platelet Count: 20 10*3/uL — ABNORMAL LOW (ref 140–400)
RBC: 4.55 MIL/uL (ref 4.20–5.82)
RDW: 14.5 % (ref 11.0–14.6)
WBC Count: 5.1 10*3/uL (ref 4.0–10.3)

## 2018-01-22 ENCOUNTER — Inpatient Hospital Stay: Payer: Medicare Other

## 2018-01-22 DIAGNOSIS — E119 Type 2 diabetes mellitus without complications: Secondary | ICD-10-CM | POA: Diagnosis not present

## 2018-01-22 DIAGNOSIS — Z79899 Other long term (current) drug therapy: Secondary | ICD-10-CM | POA: Diagnosis not present

## 2018-01-22 DIAGNOSIS — D696 Thrombocytopenia, unspecified: Secondary | ICD-10-CM

## 2018-01-22 DIAGNOSIS — D693 Immune thrombocytopenic purpura: Secondary | ICD-10-CM | POA: Diagnosis not present

## 2018-01-22 DIAGNOSIS — Z8572 Personal history of non-Hodgkin lymphomas: Secondary | ICD-10-CM | POA: Diagnosis not present

## 2018-01-22 DIAGNOSIS — Z794 Long term (current) use of insulin: Secondary | ICD-10-CM | POA: Diagnosis not present

## 2018-01-22 DIAGNOSIS — R5383 Other fatigue: Secondary | ICD-10-CM | POA: Diagnosis not present

## 2018-01-22 LAB — CBC WITH DIFFERENTIAL (CANCER CENTER ONLY)
Basophils Absolute: 0.1 10*3/uL (ref 0.0–0.1)
Basophils Relative: 1 %
EOS ABS: 0.3 10*3/uL (ref 0.0–0.5)
EOS PCT: 5 %
HCT: 40.2 % (ref 38.4–49.9)
Hemoglobin: 13.4 g/dL (ref 13.0–17.1)
LYMPHS ABS: 1.1 10*3/uL (ref 0.9–3.3)
LYMPHS PCT: 21 %
MCH: 29.2 pg (ref 27.2–33.4)
MCHC: 33.4 g/dL (ref 32.0–36.0)
MCV: 87.3 fL (ref 79.3–98.0)
MONO ABS: 0.6 10*3/uL (ref 0.1–0.9)
MONOS PCT: 12 %
Neutro Abs: 3.2 10*3/uL (ref 1.5–6.5)
Neutrophils Relative %: 61 %
PLATELETS: 22 10*3/uL — AB (ref 140–400)
RBC: 4.6 MIL/uL (ref 4.20–5.82)
RDW: 14.7 % — AB (ref 11.0–14.6)
WBC Count: 5.2 10*3/uL (ref 4.0–10.3)

## 2018-01-23 DIAGNOSIS — C8300 Small cell B-cell lymphoma, unspecified site: Secondary | ICD-10-CM | POA: Diagnosis not present

## 2018-01-23 DIAGNOSIS — F3170 Bipolar disorder, currently in remission, most recent episode unspecified: Secondary | ICD-10-CM | POA: Diagnosis not present

## 2018-01-23 DIAGNOSIS — Z794 Long term (current) use of insulin: Secondary | ICD-10-CM | POA: Diagnosis not present

## 2018-01-23 DIAGNOSIS — Z9049 Acquired absence of other specified parts of digestive tract: Secondary | ICD-10-CM | POA: Diagnosis not present

## 2018-01-23 DIAGNOSIS — E119 Type 2 diabetes mellitus without complications: Secondary | ICD-10-CM | POA: Diagnosis not present

## 2018-01-23 DIAGNOSIS — D693 Immune thrombocytopenic purpura: Secondary | ICD-10-CM | POA: Diagnosis not present

## 2018-01-23 DIAGNOSIS — Z6841 Body Mass Index (BMI) 40.0 and over, adult: Secondary | ICD-10-CM | POA: Diagnosis not present

## 2018-01-25 ENCOUNTER — Other Ambulatory Visit: Payer: Self-pay | Admitting: General Surgery

## 2018-01-25 DIAGNOSIS — D693 Immune thrombocytopenic purpura: Secondary | ICD-10-CM

## 2018-01-26 ENCOUNTER — Encounter: Payer: Self-pay | Admitting: Oncology

## 2018-01-30 ENCOUNTER — Inpatient Hospital Stay: Payer: Medicare Other

## 2018-01-30 DIAGNOSIS — Z8572 Personal history of non-Hodgkin lymphomas: Secondary | ICD-10-CM | POA: Diagnosis not present

## 2018-01-30 DIAGNOSIS — R5383 Other fatigue: Secondary | ICD-10-CM | POA: Diagnosis not present

## 2018-01-30 DIAGNOSIS — Z794 Long term (current) use of insulin: Secondary | ICD-10-CM | POA: Diagnosis not present

## 2018-01-30 DIAGNOSIS — Z79899 Other long term (current) drug therapy: Secondary | ICD-10-CM | POA: Diagnosis not present

## 2018-01-30 DIAGNOSIS — D693 Immune thrombocytopenic purpura: Secondary | ICD-10-CM | POA: Diagnosis not present

## 2018-01-30 DIAGNOSIS — E119 Type 2 diabetes mellitus without complications: Secondary | ICD-10-CM | POA: Diagnosis not present

## 2018-01-30 DIAGNOSIS — D696 Thrombocytopenia, unspecified: Secondary | ICD-10-CM

## 2018-01-30 LAB — CBC WITH DIFFERENTIAL (CANCER CENTER ONLY)
Basophils Absolute: 0 10*3/uL (ref 0.0–0.1)
Basophils Relative: 1 %
EOS PCT: 5 %
Eosinophils Absolute: 0.2 10*3/uL (ref 0.0–0.5)
HCT: 38.2 % — ABNORMAL LOW (ref 38.4–49.9)
Hemoglobin: 13.2 g/dL (ref 13.0–17.1)
LYMPHS ABS: 1 10*3/uL (ref 0.9–3.3)
LYMPHS PCT: 22 %
MCH: 29.7 pg (ref 27.2–33.4)
MCHC: 34.6 g/dL (ref 32.0–36.0)
MCV: 85.8 fL (ref 79.3–98.0)
MONO ABS: 0.5 10*3/uL (ref 0.1–0.9)
MONOS PCT: 11 %
Neutro Abs: 2.8 10*3/uL (ref 1.5–6.5)
Neutrophils Relative %: 61 %
PLATELETS: 27 10*3/uL — AB (ref 140–400)
RBC: 4.45 MIL/uL (ref 4.20–5.82)
RDW: 14 % (ref 11.0–14.6)
WBC Count: 4.5 10*3/uL (ref 4.0–10.3)

## 2018-02-05 ENCOUNTER — Inpatient Hospital Stay: Payer: Medicare Other | Attending: Oncology

## 2018-02-05 DIAGNOSIS — D696 Thrombocytopenia, unspecified: Secondary | ICD-10-CM

## 2018-02-05 DIAGNOSIS — E119 Type 2 diabetes mellitus without complications: Secondary | ICD-10-CM | POA: Insufficient documentation

## 2018-02-05 DIAGNOSIS — Z794 Long term (current) use of insulin: Secondary | ICD-10-CM | POA: Diagnosis not present

## 2018-02-05 DIAGNOSIS — Z9221 Personal history of antineoplastic chemotherapy: Secondary | ICD-10-CM | POA: Diagnosis not present

## 2018-02-05 DIAGNOSIS — Z79899 Other long term (current) drug therapy: Secondary | ICD-10-CM | POA: Insufficient documentation

## 2018-02-05 DIAGNOSIS — C880 Waldenstrom macroglobulinemia: Secondary | ICD-10-CM | POA: Insufficient documentation

## 2018-02-05 DIAGNOSIS — D693 Immune thrombocytopenic purpura: Secondary | ICD-10-CM | POA: Insufficient documentation

## 2018-02-05 LAB — CBC WITH DIFFERENTIAL (CANCER CENTER ONLY)
Basophils Absolute: 0 10*3/uL (ref 0.0–0.1)
Basophils Relative: 1 %
EOS ABS: 0.4 10*3/uL (ref 0.0–0.5)
Eosinophils Relative: 7 %
HCT: 39.9 % (ref 38.4–49.9)
HEMOGLOBIN: 13.8 g/dL (ref 13.0–17.1)
LYMPHS ABS: 1.1 10*3/uL (ref 0.9–3.3)
Lymphocytes Relative: 21 %
MCH: 30.1 pg (ref 27.2–33.4)
MCHC: 34.6 g/dL (ref 32.0–36.0)
MCV: 86.9 fL (ref 79.3–98.0)
MONOS PCT: 11 %
Monocytes Absolute: 0.6 10*3/uL (ref 0.1–0.9)
NEUTROS PCT: 60 %
Neutro Abs: 3.2 10*3/uL (ref 1.5–6.5)
Platelet Count: 33 10*3/uL — ABNORMAL LOW (ref 140–400)
RBC: 4.59 MIL/uL (ref 4.20–5.82)
RDW: 14.1 % (ref 11.0–14.6)
WBC Count: 5.4 10*3/uL (ref 4.0–10.3)

## 2018-02-07 ENCOUNTER — Other Ambulatory Visit: Payer: Self-pay

## 2018-02-07 ENCOUNTER — Telehealth: Payer: Self-pay

## 2018-02-07 NOTE — Telephone Encounter (Signed)
Received 2 boxes of Apidra from [employer] pt connection for patient. He was called and notified of this.

## 2018-02-09 ENCOUNTER — Other Ambulatory Visit: Payer: Medicare Other

## 2018-02-12 ENCOUNTER — Inpatient Hospital Stay (HOSPITAL_BASED_OUTPATIENT_CLINIC_OR_DEPARTMENT_OTHER): Payer: Medicare Other | Admitting: Oncology

## 2018-02-12 ENCOUNTER — Telehealth: Payer: Self-pay | Admitting: Oncology

## 2018-02-12 ENCOUNTER — Inpatient Hospital Stay: Payer: Medicare Other

## 2018-02-12 VITALS — BP 142/91 | HR 60 | Temp 98.2°F | Resp 18 | Ht 74.0 in | Wt 318.1 lb

## 2018-02-12 DIAGNOSIS — C880 Waldenstrom macroglobulinemia: Secondary | ICD-10-CM | POA: Diagnosis not present

## 2018-02-12 DIAGNOSIS — D693 Immune thrombocytopenic purpura: Secondary | ICD-10-CM | POA: Diagnosis not present

## 2018-02-12 DIAGNOSIS — D696 Thrombocytopenia, unspecified: Secondary | ICD-10-CM

## 2018-02-12 DIAGNOSIS — E119 Type 2 diabetes mellitus without complications: Secondary | ICD-10-CM | POA: Diagnosis not present

## 2018-02-12 DIAGNOSIS — Z794 Long term (current) use of insulin: Secondary | ICD-10-CM | POA: Diagnosis not present

## 2018-02-12 DIAGNOSIS — Z79899 Other long term (current) drug therapy: Secondary | ICD-10-CM | POA: Diagnosis not present

## 2018-02-12 DIAGNOSIS — Z9221 Personal history of antineoplastic chemotherapy: Secondary | ICD-10-CM

## 2018-02-12 LAB — CBC WITH DIFFERENTIAL (CANCER CENTER ONLY)
BASOS ABS: 0.1 10*3/uL (ref 0.0–0.1)
BASOS PCT: 1 %
EOS PCT: 8 %
Eosinophils Absolute: 0.4 10*3/uL (ref 0.0–0.5)
HCT: 38.7 % (ref 38.4–49.9)
Hemoglobin: 13.2 g/dL (ref 13.0–17.1)
Lymphocytes Relative: 21 %
Lymphs Abs: 1 10*3/uL (ref 0.9–3.3)
MCH: 29.9 pg (ref 27.2–33.4)
MCHC: 34.1 g/dL (ref 32.0–36.0)
MCV: 87.6 fL (ref 79.3–98.0)
MONO ABS: 0.5 10*3/uL (ref 0.1–0.9)
MONOS PCT: 10 %
Neutro Abs: 2.8 10*3/uL (ref 1.5–6.5)
Neutrophils Relative %: 60 %
PLATELETS: 39 10*3/uL — AB (ref 140–400)
RBC: 4.42 MIL/uL (ref 4.20–5.82)
RDW: 13.7 % (ref 11.0–14.6)
WBC Count: 4.7 10*3/uL (ref 4.0–10.3)

## 2018-02-12 NOTE — Telephone Encounter (Signed)
Appointments scheduled AVS/Calendr printed per 6/10 los

## 2018-02-12 NOTE — Progress Notes (Signed)
Hematology and Oncology Follow Up Visit  Edward Clarke [id number] 25-Feb-1966 52 y.o. 02/12/2018 9:30 AM Patient, No Pcp PerNo ref. provider found   Principle Diagnosis: 52 year old man with:  1. Waldenstrm's macroglobulinemia diagnosed in 2014.  That time he was found to have IgM of 3 g/dL and M-spike about 2 g/dL   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  Previous therapy:   Chemotherapy with Bendamustine and Rituxan on 02/14/13. He is S/P cycle 6 on 07/04/2013 with CR.   Status post IVIG completed on 10/20/2017.  Prednisone 80 mg daily started on September 26, 2017.  He is completely tapered off in April 2019.  Rituximab weekly for 4 weeks started on December 12, 2017.  Current therapy:   Active surveillance.   Interim History:  Edward Clarke is here for a follow-up visit.  Since the last visit, he was evaluated by general surgery for possible splenectomy and for the time being like to defer that option.  He denies any active bleeding episodes including hemoptysis or hematemesis.  He denies any easy bruising or hematochezia.  He continues to be active and attends activities of daily living.  He denies any hospitalization or illnesses.  He denies any constitutional symptoms or weight loss.  He does not report any headaches, blurry vision, syncope or seizures.  He denies any confusion or depression.  Does not report any fevers, chills or sweats.  Does not report any cough, wheezing or hemoptysis.  Does not report any chest pain, palpitation, orthopnea or leg edema.  Does not report any nausea, vomiting or abdominal pain.  She does not report change in his bowel habits.   Denies any paralysis or myalgias.  Does not report frequency, urgency or hematuria.   Does not report any lymphadenopathy.  He denies any easy bruising.  Remaining review of systems is negative.  .  Medications: I have reviewed the patient's current medications.  Current Outpatient Medications  Medication Sig Dispense Refill  .  atorvastatin (LIPITOR) 20 MG tablet Take 1 tablet (20 mg total) by mouth daily. 30 tablet 3  . glucose blood (ONETOUCH VERIO) test strip USE ONE STRIP TO CHECK GLUCOSE 4 TIMES DAILY Dx code E11.65 400 each 0  . ibuprofen (ADVIL,MOTRIN) 200 MG tablet Take 400-800 mg by mouth every 8 (eight) hours as needed for headache. Reported on 02/18/2016    . Insulin Glargine (TOUJEO SOLOSTAR) 300 UNIT/ML SOPN Inject 50 Units into the skin daily. 5 pen 2  . Insulin Glulisine (APIDRA SOLOSTAR) 100 UNIT/ML Solostar Pen Inject 20 Units into the skin 2 (two) times daily. 5 pen 2  . Insulin Pen Needle 31G X 5 MM MISC Use to inject insulin once daily DX code E11.65 90 each 3  . lisinopril (PRINIVIL,ZESTRIL) 20 MG tablet TAKE 1 TABLET BY MOUTH ONCE DAILY 90 tablet 3  . metFORMIN (GLUCOPHAGE) 1000 MG tablet Take 1 tablet (1,000 mg total) by mouth 2 (two) times daily with a meal. 60 tablet 3  . predniSONE (DELTASONE) 20 MG tablet Take as directed. 45 tablet 0   No current facility-administered medications for this visit.      Allergies:  Allergies  Allergen Reactions  . Benazepril Other (See Comments)    Reaction unknown - may have been headaches    Past Medical History, Surgical history, Social history, and Family History reviewed today.    Physical Exam: Blood pressure (!) 142/91, pulse 60, temperature 98.2 F (36.8 C), temperature source Oral, resp. rate 18, height 6\' 2"  (  1.88 m), weight (!) 318 lb 1.6 oz (144.3 kg), SpO2 100 %.    ECOG: 1 General appearance: Alert, awake gentleman without distress. Head: Normocephalic without abnormalities. Oropharynx: No oral ulcers or thrush. Eyes: Pupils are equal and round reactive to light. Lymph nodes: No lymphadenopathy noted in the cervical, axillary or supraclavicular lymph nodes. Heart: Regular rate any murmurs or gallops. Lung: Clear that any rhonchi, wheezes or dullness to percussion. Abdomen: Soft, any rebound or guarding.  No shifting dullness or  ascites. Skin: No ecchymosis or petechiae. Neurological: Intact motor, sensory and deep tendon reflexes.    Lab Results: Lab Results  Component Value Date   WBC 4.7 02/12/2018   HGB 13.2 02/12/2018   HCT 38.7 02/12/2018   MCV 87.6 02/12/2018   PLT 39 (L) 02/12/2018     Chemistry      Component Value Date/Time   NA 135 (L) 01/08/2018 0902   NA 139 01/14/2015 1003   K 5.0 01/08/2018 0902   K 4.3 01/14/2015 1003   CL 106 01/08/2018 0902   CL 101 02/14/2013 0758   CO2 25 01/08/2018 0902   CO2 27 01/14/2015 1003   BUN 15 01/08/2018 0902   BUN 8.1 01/14/2015 1003   CREATININE 1.47 (H) 01/08/2018 0902   CREATININE 1.2 01/14/2015 1003      Component Value Date/Time   CALCIUM 9.5 01/08/2018 0902   CALCIUM 9.0 01/14/2015 1003   ALKPHOS 44 01/08/2018 0902   ALKPHOS 80 01/14/2015 1003   AST 22 01/08/2018 0902   AST 19 01/14/2015 1003   ALT 22 01/08/2018 0902   ALT 21 01/14/2015 1003   BILITOT 0.3 01/08/2018 0902   BILITOT 0.44 01/14/2015 1003         Impression and Plan:  52 year old man with:  1.  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  He is status post therapy outlined above most recently rituximab weekly for 4 weeks.  He appears to have a reasonable response to therapy with platelet count slowly improving over.  Of time.  He denies any active bleeding at this time. The risks and benefits of proceeding with a splenectomy was discussed today for the time being he would like to defer that option.  I have recommended continued observation and surveillance for the time being and will continue to monitor his platelets periodically.  And the option of splenectomy will be revisited if he develops any relapse.   2.  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  Protein studies will be checked periodically.  3.  Diabetes: Under control and followed by his primary care physician.  4.  Follow-up: Will be every 2 weeks and have MD follow-up in 6 weeks.  15   minutes was spent with the patient face-to-face today.  More than 50% of time was dedicated to patient counseling, education and discussing future treatment options and coordinating his care.    Jeffery Bachmeier 6/10/20199:30 AM

## 2018-02-16 ENCOUNTER — Other Ambulatory Visit: Payer: Medicare Other

## 2018-02-21 ENCOUNTER — Encounter: Payer: Self-pay | Admitting: Oncology

## 2018-02-23 ENCOUNTER — Other Ambulatory Visit: Payer: Medicare Other

## 2018-02-27 ENCOUNTER — Inpatient Hospital Stay: Payer: Medicare Other

## 2018-02-27 DIAGNOSIS — C880 Waldenstrom macroglobulinemia: Secondary | ICD-10-CM | POA: Diagnosis not present

## 2018-02-27 DIAGNOSIS — Z794 Long term (current) use of insulin: Secondary | ICD-10-CM | POA: Diagnosis not present

## 2018-02-27 DIAGNOSIS — D693 Immune thrombocytopenic purpura: Secondary | ICD-10-CM | POA: Diagnosis not present

## 2018-02-27 DIAGNOSIS — Z79899 Other long term (current) drug therapy: Secondary | ICD-10-CM | POA: Diagnosis not present

## 2018-02-27 DIAGNOSIS — E119 Type 2 diabetes mellitus without complications: Secondary | ICD-10-CM | POA: Diagnosis not present

## 2018-02-27 DIAGNOSIS — Z9221 Personal history of antineoplastic chemotherapy: Secondary | ICD-10-CM | POA: Diagnosis not present

## 2018-02-27 LAB — CBC WITH DIFFERENTIAL (CANCER CENTER ONLY)
BASOS ABS: 0 10*3/uL (ref 0.0–0.1)
BASOS PCT: 0 %
EOS ABS: 0.4 10*3/uL (ref 0.0–0.5)
EOS PCT: 10 %
HCT: 42.2 % (ref 38.4–49.9)
HEMOGLOBIN: 14.1 g/dL (ref 13.0–17.1)
Lymphocytes Relative: 21 %
Lymphs Abs: 0.9 10*3/uL (ref 0.9–3.3)
MCH: 29.5 pg (ref 27.2–33.4)
MCHC: 33.3 g/dL (ref 32.0–36.0)
MCV: 88.6 fL (ref 79.3–98.0)
Monocytes Absolute: 0.5 10*3/uL (ref 0.1–0.9)
Monocytes Relative: 10 %
NEUTROS PCT: 59 %
Neutro Abs: 2.6 10*3/uL (ref 1.5–6.5)
Platelet Count: 44 10*3/uL — ABNORMAL LOW (ref 140–400)
RBC: 4.76 MIL/uL (ref 4.20–5.82)
RDW: 13.7 % (ref 11.0–14.6)
WBC: 4.5 10*3/uL (ref 4.0–10.3)

## 2018-03-02 ENCOUNTER — Other Ambulatory Visit (INDEPENDENT_AMBULATORY_CARE_PROVIDER_SITE_OTHER): Payer: Medicare Other

## 2018-03-02 DIAGNOSIS — Z794 Long term (current) use of insulin: Secondary | ICD-10-CM

## 2018-03-02 DIAGNOSIS — E1165 Type 2 diabetes mellitus with hyperglycemia: Secondary | ICD-10-CM

## 2018-03-02 LAB — LIPID PANEL
Cholesterol: 160 mg/dL (ref 0–200)
HDL: 38.8 mg/dL — AB (ref >39.00)
LDL Cholesterol: 100 mg/dL — ABNORMAL HIGH (ref 0–99)
NonHDL: 121.67
TRIGLYCERIDES: 106 mg/dL (ref 0.0–149.0)
Total CHOL/HDL Ratio: 4
VLDL: 21.2 mg/dL (ref 0.0–40.0)

## 2018-03-02 LAB — COMPREHENSIVE METABOLIC PANEL
ALBUMIN: 4.5 g/dL (ref 3.5–5.2)
ALT: 14 U/L (ref 0–53)
AST: 16 U/L (ref 0–37)
Alkaline Phosphatase: 50 U/L (ref 39–117)
BILIRUBIN TOTAL: 0.5 mg/dL (ref 0.2–1.2)
BUN: 13 mg/dL (ref 6–23)
CALCIUM: 9.8 mg/dL (ref 8.4–10.5)
CO2: 27 meq/L (ref 19–32)
CREATININE: 1.29 mg/dL (ref 0.40–1.50)
Chloride: 105 mEq/L (ref 96–112)
GFR: 74.84 mL/min (ref >60.00)
Glucose, Bld: 148 mg/dL — ABNORMAL HIGH (ref 70–99)
Potassium: 4.8 mEq/L (ref 3.5–5.1)
Sodium: 138 mEq/L (ref 135–145)
Total Protein: 6.5 g/dL (ref 6.0–8.3)

## 2018-03-02 LAB — HEMOGLOBIN A1C: Hgb A1c MFr Bld: 7.3 % — ABNORMAL HIGH (ref 4.6–6.5)

## 2018-03-03 LAB — FRUCTOSAMINE: Fructosamine: 287 umol/L — ABNORMAL HIGH (ref 0–285)

## 2018-03-05 ENCOUNTER — Encounter: Payer: Self-pay | Admitting: Oncology

## 2018-03-06 ENCOUNTER — Encounter: Payer: Self-pay | Admitting: Endocrinology

## 2018-03-06 ENCOUNTER — Ambulatory Visit (INDEPENDENT_AMBULATORY_CARE_PROVIDER_SITE_OTHER): Payer: Medicare Other | Admitting: Endocrinology

## 2018-03-06 VITALS — BP 130/82 | HR 74 | Ht 74.0 in | Wt 316.2 lb

## 2018-03-06 DIAGNOSIS — E1165 Type 2 diabetes mellitus with hyperglycemia: Secondary | ICD-10-CM

## 2018-03-06 DIAGNOSIS — Z794 Long term (current) use of insulin: Secondary | ICD-10-CM | POA: Diagnosis not present

## 2018-03-06 DIAGNOSIS — I10 Essential (primary) hypertension: Secondary | ICD-10-CM | POA: Diagnosis not present

## 2018-03-06 DIAGNOSIS — E782 Mixed hyperlipidemia: Secondary | ICD-10-CM | POA: Diagnosis not present

## 2018-03-06 NOTE — Progress Notes (Signed)
Patient ID: Juandaniel Manfredo, male   DOB: 30-Jan-1966, 52 y.o.   MRN: [id number]   Reason for Appointment: Diabetes follow-up   History of Present Illness    Diagnosis: Type 2 diabetes mellitus, date of diagnosis: 2005.   He has been on basal bolus insulin regimen in the last few years along with metformin Most of the difficulty with control is related to his noncompliance with diet, medications, glucose monitoring and financial issues Frequently will had been running out of one or the other medication or insulin  and blood sugars would not be well controlled  Recent history:   INSULIN: Toujeo units 50 units once daily ; Apidra insulin 20-30 units before supper  His A1c was 8.7 previously and now 7.3  Current management, blood sugar patterns and problems:  His A1c was probably high previously because of being on prednisone  Also now he is able to get his Toujeo and Apidra consistently and is taking this  However he does not take Apidra at breakfast time which is late morning and will frequently eat cereal  Overall his blood sugars have been excellent with median 139 but was higher this past week and because of not taking insulin  He has been taking metformin although recently ran out  FASTING readings are difficult to assess that he monitors them irregularly and at different times but they appear to be only mildly increased  Has not had any further prednisone  He has a few readings later at night which are usually fairly good including a relatively low sugar of 67 which he thinks may have been before dinner  He says has been generally more active but moving to a new apartment and also now living on the second floor but despite reminders does not do any formal exercise  He is trying to do a little better with diet, however still can do better with reducing portions and fats such as with large sandwiches at [employer] and frozen pizza  More recently has been less active and  less motivated to watch his diet or control his portions  Overall has gained a little weight  No significant hypoglycemia other than the one reading of 67 but he is afraid to take his morning region because of fear of hypoglycemia   Glucometer: One Touch Verio.  Checking blood sugar about once a day  Blood Glucose readings by download analysis:  Mean values apply above for all meters except median for One Touch  PRE-MEAL Fasting Lunch Dinner Bedtime Overall  Glucose range:  101-254    67-411  Mean/median:  136    139   POST-MEAL PC Breakfast PC Lunch PC Dinner  Glucose range:   ?  67-411  Mean/median:       Treatment of hypoglycemia: Usually coffee creamer or food.  Does have glucose tablets at home    Meals: Usually 2 meals a day ( lunch 12-1 PM, supper 9 pm)   Physical activity: exercise: Very little recently   Wt Readings from Last 3 Encounters:  03/06/18 (!) 316 lb 3.2 oz (143.4 kg)  02/12/18 (!) 318 lb 1.6 oz (144.3 kg)  01/08/18 (!) 312 lb 6.4 oz (141.7 kg)     The HbgA1c has been as high as 11.6 previously .  Lab Results  Component Value Date   HGBA1C 7.3 (H) 03/02/2018   HGBA1C 8.7 12/20/2017   HGBA1C 7.2 (H) 07/11/2017   Lab Results  Component Value Date   MICROALBUR <0.7 09/20/2017  LDLCALC 100 (H) 03/02/2018   CREATININE 1.29 03/02/2018    Lab Results  Component Value Date   FRUCTOSAMINE 287 (H) 03/02/2018   FRUCTOSAMINE 317 (H) 09/20/2017   FRUCTOSAMINE 337 (H) 01/02/2017      Allergies as of 03/06/2018      Reactions   Benazepril Other (See Comments)   Reaction unknown - may have been headaches      Medication List        Accurate as of 03/06/18  1:51 PM. Always use your most recent med list.          atorvastatin 20 MG tablet Commonly known as:  LIPITOR Take 1 tablet (20 mg total) by mouth daily.   glucose blood test strip Commonly known as:  ONETOUCH VERIO USE ONE STRIP TO CHECK GLUCOSE 4 TIMES DAILY Dx code E11.65     ibuprofen 200 MG tablet Commonly known as:  ADVIL,MOTRIN Take 400-800 mg by mouth every 8 (eight) hours as needed for headache. Reported on 02/18/2016   Insulin Glargine 300 UNIT/ML Sopn Commonly known as:  TOUJEO SOLOSTAR Inject 50 Units into the skin daily.   Insulin Glulisine 100 UNIT/ML Solostar Pen Commonly known as:  APIDRA SOLOSTAR Inject 20 Units into the skin 2 (two) times daily.   Insulin Pen Needle 31G X 5 MM Misc Use to inject insulin once daily DX code E11.65   lisinopril 20 MG tablet Commonly known as:  PRINIVIL,ZESTRIL TAKE 1 TABLET BY MOUTH ONCE DAILY   metFORMIN 1000 MG tablet Commonly known as:  GLUCOPHAGE Take 1 tablet (1,000 mg total) by mouth 2 (two) times daily with a meal.   predniSONE 20 MG tablet Commonly known as:  DELTASONE Take as directed.       Allergies:  Allergies  Allergen Reactions  . Benazepril Other (See Comments)    Reaction unknown - may have been headaches    Past Medical History:  Diagnosis Date  . Diabetes mellitus   . Hyperlipidemia   . Hypertension   . Monoclonal paraproteinemia   . Obesity     Past Surgical History:  Procedure Laterality Date  . APPENDECTOMY    . EYE SURGERY    . TONSILLECTOMY      Family History  Problem Relation Age of Onset  . Diabetes Sister     Social History:  reports that he has quit smoking. His smoking use included cigarettes. He has never used smokeless tobacco. He reports that he drinks about 1.2 oz of alcohol per week. He reports that he has current or past drug history. Drug: Marijuana. Frequency: 7.00 times per week.  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    HYPERLIPIDEMIA:  The lipid abnormality consists of elevated LDL treated with Lipitor  LDL is improved, now taking 20 mg dose   Lab Results  Component Value Date   CHOL 160 03/02/2018    HDL 38.80 (L) 03/02/2018   LDLCALC 100 (H) 03/02/2018   LDLDIRECT 174.0 05/27/2016   TRIG 106.0 03/02/2018   CHOLHDL 4 03/02/2018   Renal dysfunction: His creatinine is slightly better again   Lab Results  Component Value Date   CREATININE 1.29 03/02/2018   CREATININE 1.47 (H) 01/08/2018   CREATININE 1.61 (H) 01/02/2018    He does not get his eyes checked because of cost of the exam    Examination:  BP 130/82 (BP Location: Left Arm, Patient Position: Sitting, Cuff Size: Normal)   Pulse 74   Ht 6\' 2"  (1.88 m)   Wt (!) 316 lb 3.2 oz (143.4 kg)   SpO2 96%   BMI 40.60 kg/m   Body mass index is 40.6 kg/m.      Assesment/Plan:   1. Diabetes type 2,  with morbid obesity  See history of present illness for current blood sugar patterns, management  and problems identified  His A1c is down to 7.3  He is on insulin and metformin and taking bolus insulin only at suppertime currently FASTING blood sugars are appearing fairly good with current regimen of Toujeo 50 units Although he is doing more readings in the evening sometimes he may not be doing readings after eating consistently and not clear which sugars are after meals at night He will randomly have high blood sugars based on what he is eating or occasionally not taking insulin consistently He still has difficulty losing weight because of periodic high calorie intake and lack of formal exercise  His day-to-day management including diet, timing of insulin, blood sugar targets, adjusting of insulin based on meal size and carbohydrates, ways to get his weight down more discussed  RECOMMENDATIONS:  He needs to take at least 5 units of Apidra at breakfast  To check more readings after evening meal and labeled them on his meter  Take metformin consistently  Encouraged him to start walking or other regular exercise  Cut back on higher fat options when eating out or getting ready to eat foods    2. HYPERTENSION: Blood  pressure is controlled recently and he will stay on lisinopril  3. Hyperlipidemia: Better controlled now and encouraged him to continue 20 mg Lipitor  Recommended scheduling eye exam and discussed importance of doing this for his various problems and diabetes   Patient Instructions  Check blood sugars on waking up 3/7 days  Also check blood sugars about 2 hours after a meal and do this after different meals by rotation  Recommended blood sugar levels on waking up is 90-130 and about 2 hours after meal is 130-160  Please bring your blood sugar monitor to each visit, thank you  Exercise  At least 5 U for cereal    Counseling time on subjects discussed in assessment and plan sections is over 50% of today's 25 minute visit      Elayne Snare 03/06/2018, 1:51 PM   Note: This office note was prepared with Dragon voice recognition system technology. Any transcriptional errors that result from this process are unintentional.

## 2018-03-06 NOTE — Patient Instructions (Addendum)
Check blood sugars on waking up 3/7 days  Also check blood sugars about 2 hours after a meal and do this after different meals by rotation  Recommended blood sugar levels on waking up is 90-130 and about 2 hours after meal is 130-160  Please bring your blood sugar monitor to each visit, thank you  Exercise  At least 5 U for cereal

## 2018-03-14 ENCOUNTER — Inpatient Hospital Stay: Payer: Medicare Other | Attending: Oncology

## 2018-03-14 DIAGNOSIS — E119 Type 2 diabetes mellitus without complications: Secondary | ICD-10-CM | POA: Diagnosis not present

## 2018-03-14 DIAGNOSIS — Z79899 Other long term (current) drug therapy: Secondary | ICD-10-CM | POA: Insufficient documentation

## 2018-03-14 DIAGNOSIS — C880 Waldenstrom macroglobulinemia: Secondary | ICD-10-CM | POA: Diagnosis not present

## 2018-03-14 DIAGNOSIS — Z9221 Personal history of antineoplastic chemotherapy: Secondary | ICD-10-CM | POA: Diagnosis not present

## 2018-03-14 DIAGNOSIS — D693 Immune thrombocytopenic purpura: Secondary | ICD-10-CM | POA: Diagnosis not present

## 2018-03-14 DIAGNOSIS — Z8572 Personal history of non-Hodgkin lymphomas: Secondary | ICD-10-CM | POA: Diagnosis not present

## 2018-03-14 DIAGNOSIS — Z794 Long term (current) use of insulin: Secondary | ICD-10-CM | POA: Insufficient documentation

## 2018-03-14 LAB — CBC WITH DIFFERENTIAL (CANCER CENTER ONLY)
Basophils Absolute: 0 10*3/uL (ref 0.0–0.1)
Basophils Relative: 1 %
EOS ABS: 0.4 10*3/uL (ref 0.0–0.5)
Eosinophils Relative: 10 %
HCT: 39.9 % (ref 38.4–49.9)
HEMOGLOBIN: 13.7 g/dL (ref 13.0–17.1)
LYMPHS ABS: 1.1 10*3/uL (ref 0.9–3.3)
Lymphocytes Relative: 25 %
MCH: 30 pg (ref 27.2–33.4)
MCHC: 34.3 g/dL (ref 32.0–36.0)
MCV: 87.5 fL (ref 79.3–98.0)
Monocytes Absolute: 0.3 10*3/uL (ref 0.1–0.9)
Monocytes Relative: 8 %
NEUTROS ABS: 2.4 10*3/uL (ref 1.5–6.5)
NEUTROS PCT: 56 %
Platelet Count: 44 10*3/uL — ABNORMAL LOW (ref 140–400)
RBC: 4.56 MIL/uL (ref 4.20–5.82)
RDW: 12.7 % (ref 11.0–14.6)
WBC: 4.3 10*3/uL (ref 4.0–10.3)

## 2018-03-26 ENCOUNTER — Inpatient Hospital Stay (HOSPITAL_BASED_OUTPATIENT_CLINIC_OR_DEPARTMENT_OTHER): Payer: Medicare Other | Admitting: Oncology

## 2018-03-26 ENCOUNTER — Inpatient Hospital Stay: Payer: Medicare Other

## 2018-03-26 ENCOUNTER — Telehealth: Payer: Self-pay

## 2018-03-26 VITALS — BP 139/94 | HR 59 | Temp 98.0°F | Resp 18 | Ht 74.0 in | Wt 319.5 lb

## 2018-03-26 DIAGNOSIS — Z79899 Other long term (current) drug therapy: Secondary | ICD-10-CM | POA: Diagnosis not present

## 2018-03-26 DIAGNOSIS — E119 Type 2 diabetes mellitus without complications: Secondary | ICD-10-CM

## 2018-03-26 DIAGNOSIS — Z8572 Personal history of non-Hodgkin lymphomas: Secondary | ICD-10-CM | POA: Diagnosis not present

## 2018-03-26 DIAGNOSIS — D693 Immune thrombocytopenic purpura: Secondary | ICD-10-CM | POA: Diagnosis not present

## 2018-03-26 DIAGNOSIS — C880 Waldenstrom macroglobulinemia: Secondary | ICD-10-CM

## 2018-03-26 DIAGNOSIS — Z794 Long term (current) use of insulin: Secondary | ICD-10-CM

## 2018-03-26 DIAGNOSIS — Z9221 Personal history of antineoplastic chemotherapy: Secondary | ICD-10-CM

## 2018-03-26 LAB — CBC WITH DIFFERENTIAL (CANCER CENTER ONLY)
BASOS ABS: 0.1 10*3/uL (ref 0.0–0.1)
BASOS PCT: 1 %
EOS ABS: 0.3 10*3/uL (ref 0.0–0.5)
Eosinophils Relative: 8 %
HCT: 42.5 % (ref 38.4–49.9)
HEMOGLOBIN: 14.4 g/dL (ref 13.0–17.1)
Lymphocytes Relative: 26 %
Lymphs Abs: 1.1 10*3/uL (ref 0.9–3.3)
MCH: 29.7 pg (ref 27.2–33.4)
MCHC: 33.9 g/dL (ref 32.0–36.0)
MCV: 87.6 fL (ref 79.3–98.0)
Monocytes Absolute: 0.5 10*3/uL (ref 0.1–0.9)
Monocytes Relative: 11 %
NEUTROS PCT: 54 %
Neutro Abs: 2.3 10*3/uL (ref 1.5–6.5)
Platelet Count: 42 10*3/uL — ABNORMAL LOW (ref 140–400)
RBC: 4.85 MIL/uL (ref 4.20–5.82)
RDW: 13 % (ref 11.0–14.6)
WBC: 4.2 10*3/uL (ref 4.0–10.3)

## 2018-03-26 NOTE — Telephone Encounter (Signed)
Printed avs and calender of upcoming appointment. Per 7/22 los 

## 2018-03-26 NOTE — Progress Notes (Signed)
Hematology and Oncology Follow Up Visit  Edward Clarke [id number] 02/15/1966 52 y.o. 03/26/2018 8:37 AM Patient, No Pcp PerNo ref. provider found   Principle Diagnosis: 52 year old man with:  1. Waldenstrm's macroglobulinemia diagnosed in 2014.  He presented with worsening anemia and elevated IgM levels.  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  He presented with the severe thrombocytopenia platelet count of less than 4 and petechiae.  Previous therapy:   Chemotherapy with Bendamustine and Rituxan on 02/14/13. He is S/P cycle 6 on 07/04/2013 with CR.   Status post IVIG completed on 10/20/2017.  Prednisone 80 mg daily started on September 26, 2017.  He is completely tapered off in April 2019.  Rituximab weekly for 4 weeks started on December 12, 2017.  Current therapy:   Active surveillance.   Interim History:  Mr. Edward Clarke presents today for a follow-up visit.  Since last visit, he has been completely asymptomatic.  He denies any bleeding episodes including epistaxis, hematochezia or melena.  He continues to attend to activities of daily living without any decline.  He denies any recent hospitalization or illnesses.  He denies any excessive fatigue or tiredness.  He denies any visual changes or confusion.  He does not report any headaches, blurry vision, syncope or seizures.  He denies any alteration in mental status or dizziness.  Does not report any fevers, chills or sweats. Does not report any chest pain, palpitation, orthopnea or leg edema.    He denies any cough, wheezing or hemoptysis.  Does not report any nausea, vomiting or abdominal pain.  She does not report change in his bowel habits.   Denies any bone pain or pathological fractures.  Does not report frequency, urgency or hematuria.   Does not report any easy bruising or petechiae.  He denies any mood changes.  Remaining review of systems is negative.  .  Medications: I have reviewed the patient's current medications.  Current Outpatient  Medications  Medication Sig Dispense Refill  . atorvastatin (LIPITOR) 20 MG tablet Take 1 tablet (20 mg total) by mouth daily. 30 tablet 3  . glucose blood (ONETOUCH VERIO) test strip USE ONE STRIP TO CHECK GLUCOSE 4 TIMES DAILY Dx code E11.65 400 each 0  . ibuprofen (ADVIL,MOTRIN) 200 MG tablet Take 400-800 mg by mouth every 8 (eight) hours as needed for headache. Reported on 02/18/2016    . Insulin Glargine (TOUJEO SOLOSTAR) 300 UNIT/ML SOPN Inject 50 Units into the skin daily. 5 pen 2  . Insulin Glulisine (APIDRA SOLOSTAR) 100 UNIT/ML Solostar Pen Inject 20 Units into the skin 2 (two) times daily. 5 pen 2  . Insulin Pen Needle 31G X 5 MM MISC Use to inject insulin once daily DX code E11.65 90 each 3  . lisinopril (PRINIVIL,ZESTRIL) 20 MG tablet TAKE 1 TABLET BY MOUTH ONCE DAILY 90 tablet 3  . metFORMIN (GLUCOPHAGE) 1000 MG tablet Take 1 tablet (1,000 mg total) by mouth 2 (two) times daily with a meal. 60 tablet 3  . predniSONE (DELTASONE) 20 MG tablet Take as directed. 45 tablet 0   No current facility-administered medications for this visit.      Allergies:  Allergies  Allergen Reactions  . Benazepril Other (See Comments)    Reaction unknown - may have been headaches    Past Medical History, Surgical history, Social history, and Family History reviewed today.    Physical Exam:   Blood pressure (!) 139/94, pulse (!) 59, temperature 98 F (36.7 C), temperature source Oral, resp. rate  18, height 6\' 2"  (1.88 m), weight (!) 319 lb 8 oz (144.9 kg), SpO2 98 %.    ECOG: 1 General appearance: Comfortable appearing gentleman without distress. Head: Atraumatic without abnormalities. Oropharynx: No oral ulcers or lesions. Eyes: Sclera anicteric. Lymph nodes: No cervical, axillary or supraclavicular lymphadenopathy. Heart: Regular rate and rhythm without any murmurs. Lung: Clear without any rhonchi, wheezes or dullness to percussion. Abdomen: Soft, nontender with good bowel sounds.   No shifting dullness or ascites. Skin: No skin rashes or lesions. Neurological: No motor or sensory deficits.    Lab Results: Lab Results  Component Value Date   WBC 4.3 03/14/2018   HGB 13.7 03/14/2018   HCT 39.9 03/14/2018   MCV 87.5 03/14/2018   PLT 44 (L) 03/14/2018     Chemistry      Component Value Date/Time   NA 138 03/02/2018 1129   NA 139 01/14/2015 1003   K 4.8 03/02/2018 1129   K 4.3 01/14/2015 1003   CL 105 03/02/2018 1129   CL 101 02/14/2013 0758   CO2 27 03/02/2018 1129   CO2 27 01/14/2015 1003   BUN 13 03/02/2018 1129   BUN 8.1 01/14/2015 1003   CREATININE 1.29 03/02/2018 1129   CREATININE 1.47 (H) 01/08/2018 0902   CREATININE 1.2 01/14/2015 1003      Component Value Date/Time   CALCIUM 9.8 03/02/2018 1129   CALCIUM 9.0 01/14/2015 1003   ALKPHOS 50 03/02/2018 1129   ALKPHOS 80 01/14/2015 1003   AST 16 03/02/2018 1129   AST 22 01/08/2018 0902   AST 19 01/14/2015 1003   ALT 14 03/02/2018 1129   ALT 22 01/08/2018 0902   ALT 21 01/14/2015 1003   BILITOT 0.5 03/02/2018 1129   BILITOT 0.3 01/08/2018 0902   BILITOT 0.44 01/14/2015 1003         Impression and Plan:  52 year old man with:  1.  ITP presented with severe thrombocytopenia in January 2019.  He is status post steroid therapy as well as rituximab and currently platelet count remains stable.  He does not report any active bleeding or recent exacerbation.  The natural course of this disease was reviewed today and his CBC from today was discussed.  He continues to show stabilization of his platelet count above 40,000 without any active bleeding.  I recommended continued observation and surveillance at this time and use different salvage therapy in the future if needed.  The could be in the form of splenectomy or Nplate.  2.  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  No evidence of relapse or recurrence at this time.  Protein studies will continue to be checked  periodically.  3.  Diabetes: No recent complications exacerbations noted.  4.  Follow-up: Will be every 2 weeks and he will have MD follow-up in September 2019.  15  minutes was spent with the patient face-to-face today.  More than 50% of time was dedicated to patient counseling, education and discussing the natural course of this disease and future treatment options.    Zola Button 7/22/20198:37 AM

## 2018-04-09 ENCOUNTER — Encounter: Payer: Self-pay | Admitting: Endocrinology

## 2018-04-09 ENCOUNTER — Other Ambulatory Visit: Payer: Self-pay

## 2018-04-09 MED ORDER — INSULIN GLARGINE 300 UNIT/ML ~~LOC~~ SOPN: 50.0000 [IU] | PEN_INJECTOR | Freq: Every day | SUBCUTANEOUS | 2 refills | Status: DC

## 2018-04-10 ENCOUNTER — Inpatient Hospital Stay: Payer: Medicare Other | Attending: Oncology

## 2018-04-10 DIAGNOSIS — C880 Waldenstrom macroglobulinemia: Secondary | ICD-10-CM | POA: Diagnosis not present

## 2018-04-10 DIAGNOSIS — D693 Immune thrombocytopenic purpura: Secondary | ICD-10-CM

## 2018-04-10 LAB — CBC WITH DIFFERENTIAL (CANCER CENTER ONLY)
BASOS PCT: 1 %
Basophils Absolute: 0 10*3/uL (ref 0.0–0.1)
EOS ABS: 0.3 10*3/uL (ref 0.0–0.5)
Eosinophils Relative: 7 %
HCT: 41.1 % (ref 38.4–49.9)
HEMOGLOBIN: 13.9 g/dL (ref 13.0–17.1)
LYMPHS ABS: 1.2 10*3/uL (ref 0.9–3.3)
Lymphocytes Relative: 25 %
MCH: 29.6 pg (ref 27.2–33.4)
MCHC: 33.8 g/dL (ref 32.0–36.0)
MCV: 87.6 fL (ref 79.3–98.0)
MONO ABS: 0.5 10*3/uL (ref 0.1–0.9)
MONOS PCT: 10 %
Neutro Abs: 2.7 10*3/uL (ref 1.5–6.5)
Neutrophils Relative %: 57 %
Platelet Count: 34 10*3/uL — ABNORMAL LOW (ref 140–400)
RBC: 4.69 MIL/uL (ref 4.20–5.82)
RDW: 13.3 % (ref 11.0–14.6)
WBC Count: 4.7 10*3/uL (ref 4.0–10.3)

## 2018-04-24 ENCOUNTER — Inpatient Hospital Stay: Payer: Medicare Other

## 2018-04-24 DIAGNOSIS — C880 Waldenstrom macroglobulinemia: Secondary | ICD-10-CM | POA: Diagnosis not present

## 2018-04-24 DIAGNOSIS — D693 Immune thrombocytopenic purpura: Secondary | ICD-10-CM

## 2018-04-24 LAB — CBC WITH DIFFERENTIAL (CANCER CENTER ONLY)
Basophils Absolute: 0 10*3/uL (ref 0.0–0.1)
Basophils Relative: 1 %
EOS PCT: 9 %
Eosinophils Absolute: 0.4 10*3/uL (ref 0.0–0.5)
HCT: 43.7 % (ref 38.4–49.9)
Hemoglobin: 15.2 g/dL (ref 13.0–17.1)
LYMPHS ABS: 1.1 10*3/uL (ref 0.9–3.3)
LYMPHS PCT: 26 %
MCH: 29.8 pg (ref 27.2–33.4)
MCHC: 34.8 g/dL (ref 32.0–36.0)
MCV: 85.7 fL (ref 79.3–98.0)
MONO ABS: 0.4 10*3/uL (ref 0.1–0.9)
Monocytes Relative: 10 %
Neutro Abs: 2.3 10*3/uL (ref 1.5–6.5)
Neutrophils Relative %: 54 %
PLATELETS: 21 10*3/uL — AB (ref 140–400)
RBC: 5.1 MIL/uL (ref 4.20–5.82)
RDW: 13.1 % (ref 11.0–14.6)
WBC Count: 4.3 10*3/uL (ref 4.0–10.3)

## 2018-05-08 ENCOUNTER — Inpatient Hospital Stay: Payer: Medicare Other | Attending: Oncology

## 2018-05-08 ENCOUNTER — Inpatient Hospital Stay (HOSPITAL_BASED_OUTPATIENT_CLINIC_OR_DEPARTMENT_OTHER): Payer: Medicare Other | Admitting: Oncology

## 2018-05-08 ENCOUNTER — Telehealth: Payer: Self-pay | Admitting: Oncology

## 2018-05-08 VITALS — BP 136/98 | HR 66 | Temp 98.2°F | Resp 18 | Ht 74.0 in | Wt 322.7 lb

## 2018-05-08 DIAGNOSIS — D693 Immune thrombocytopenic purpura: Secondary | ICD-10-CM | POA: Insufficient documentation

## 2018-05-08 DIAGNOSIS — C880 Waldenstrom macroglobulinemia: Secondary | ICD-10-CM | POA: Diagnosis not present

## 2018-05-08 DIAGNOSIS — Z9221 Personal history of antineoplastic chemotherapy: Secondary | ICD-10-CM | POA: Insufficient documentation

## 2018-05-08 DIAGNOSIS — E119 Type 2 diabetes mellitus without complications: Secondary | ICD-10-CM | POA: Insufficient documentation

## 2018-05-08 DIAGNOSIS — C8599 Non-Hodgkin lymphoma, unspecified, extranodal and solid organ sites: Secondary | ICD-10-CM

## 2018-05-08 LAB — CBC WITH DIFFERENTIAL (CANCER CENTER ONLY)
BASOS ABS: 0 10*3/uL (ref 0.0–0.1)
Basophils Relative: 0 %
EOS ABS: 0.4 10*3/uL (ref 0.0–0.5)
EOS PCT: 9 %
HCT: 41.5 % (ref 38.4–49.9)
Hemoglobin: 14.1 g/dL (ref 13.0–17.1)
Lymphocytes Relative: 20 %
Lymphs Abs: 0.8 10*3/uL — ABNORMAL LOW (ref 0.9–3.3)
MCH: 29.2 pg (ref 27.2–33.4)
MCHC: 34 g/dL (ref 32.0–36.0)
MCV: 85.7 fL (ref 79.3–98.0)
MONO ABS: 0.4 10*3/uL (ref 0.1–0.9)
Monocytes Relative: 11 %
Neutro Abs: 2.3 10*3/uL (ref 1.5–6.5)
Neutrophils Relative %: 60 %
PLATELETS: 31 10*3/uL — AB (ref 140–400)
RBC: 4.84 MIL/uL (ref 4.20–5.82)
RDW: 13.3 % (ref 11.0–14.6)
WBC: 3.9 10*3/uL — AB (ref 4.0–10.3)

## 2018-05-08 NOTE — Telephone Encounter (Signed)
Scheduled appt per 9/3 los -gave patient AVS and calender per los.  

## 2018-05-08 NOTE — Progress Notes (Signed)
Hematology and Oncology Follow Up Visit  Edward Clarke [id number] 10-01-1965 52 y.o. 05/08/2018 9:17 AM Patient, No Pcp PerNo ref. provider found   Principle Diagnosis: 52 year old man with:  1. Waldenstrm's macroglobulinemia diagnosed in 2014.  He is currently in remission after he presented with worsening anemia and elevated IgM levels and was treated with systemic therapy as outlined below.  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    Previous therapy:   Chemotherapy with Bendamustine and Rituxan on 02/14/13. He is S/P cycle 6 on 07/04/2013 with CR.   Status post IVIG completed on 10/20/2017.  Prednisone 80 mg daily started on September 26, 2017.  He is completely tapered off in April 2019.  Rituximab weekly for 4 weeks started on December 12, 2017.  Current therapy:   Active surveillance.   Interim History:  Edward Clarke is here for a follow-up visit.  Since the last visit, he reports no major changes in his health.  He denies any excessive bleeding including hemoptysis, hematemesis or epistaxis.  His appetite and performance status remains unchanged.  He denies any constitutional symptoms or lack of appetite.  He is eating well and have gained weight.  He denies any recent hospitalizations or illnesses.  He does not report any headaches, blurry vision, syncope or seizures.  He denies any dizziness or confusion.  Does not report any fevers, chills or sweats. Does not report any chest pain, palpitation, orthopnea or leg edema.    He denies any cough, wheezing or hemoptysis.  Does not report any nausea, vomiting or early satiety. She does not report any constipation or diarrhea.   Denies any arthralgias or myalgias.  Does not report frequency, urgency or hematuria.   Does not report any ecchymosis or bleeding tendency.  He denies any anxiety or depression.  Remaining review of systems is negative.  .  Medications: I have reviewed the patient's current medications.  Current Outpatient Medications   Medication Sig Dispense Refill  . atorvastatin (LIPITOR) 20 MG tablet Take 1 tablet (20 mg total) by mouth daily. 30 tablet 3  . glucose blood (ONETOUCH VERIO) test strip USE ONE STRIP TO CHECK GLUCOSE 4 TIMES DAILY Dx code E11.65 400 each 0  . ibuprofen (ADVIL,MOTRIN) 200 MG tablet Take 400-800 mg by mouth every 8 (eight) hours as needed for headache. Reported on 02/18/2016    . Insulin Glargine (TOUJEO SOLOSTAR) 300 UNIT/ML SOPN Inject 50 Units into the skin daily. 5 pen 2  . Insulin Glulisine (APIDRA SOLOSTAR) 100 UNIT/ML Solostar Pen Inject 20 Units into the skin 2 (two) times daily. 5 pen 2  . Insulin Pen Needle 31G X 5 MM MISC Use to inject insulin once daily DX code E11.65 90 each 3  . lisinopril (PRINIVIL,ZESTRIL) 20 MG tablet TAKE 1 TABLET BY MOUTH ONCE DAILY 90 tablet 3  . metFORMIN (GLUCOPHAGE) 1000 MG tablet Take 1 tablet (1,000 mg total) by mouth 2 (two) times daily with a meal. 60 tablet 3  . predniSONE (DELTASONE) 20 MG tablet Take as directed. 45 tablet 0   No current facility-administered medications for this visit.      Allergies:  Allergies  Allergen Reactions  . Benazepril Other (See Comments)    Reaction unknown - may have been headaches    Past Medical History, Surgical history, Social history, and Family History reviewed today.    Physical Exam:  Blood pressure (!) 136/98, pulse 66, temperature 98.2 F (36.8 C), temperature source Oral, resp. rate 18, height 6\' 2"  (  1.88 m), weight (!) 322 lb 11.2 oz (146.4 kg), SpO2 98 %.     ECOG: 1   General appearance: Alert, awake without any distress. Head:  Normocephalic without any lesions. Oropharynx: Without any thrush or ulcers. Eyes: No scleral icterus. Lymph nodes: No lymphadenopathy noted in the cervical, supraclavicular, or axillary nodes Heart:regular rate and rhythm, without any murmurs or gallops.   Lung: Clear to auscultation without any rhonchi, wheezes or dullness to percussion. Abdomin: Soft,  nontender without any shifting dullness or ascites. Musculoskeletal: No clubbing or cyanosis. Neurological: No motor or sensory deficits. Skin: No rashes or lesions. Psychiatric: Mood and affect appeared normal.     Lab Results: Lab Results  Component Value Date   WBC 4.3 04/24/2018   HGB 15.2 04/24/2018   HCT 43.7 04/24/2018   MCV 85.7 04/24/2018   PLT 21 (L) 04/24/2018     Chemistry      Component Value Date/Time   NA 138 03/02/2018 1129   NA 139 01/14/2015 1003   K 4.8 03/02/2018 1129   K 4.3 01/14/2015 1003   CL 105 03/02/2018 1129   CL 101 02/14/2013 0758   CO2 27 03/02/2018 1129   CO2 27 01/14/2015 1003   BUN 13 03/02/2018 1129   BUN 8.1 01/14/2015 1003   CREATININE 1.29 03/02/2018 1129   CREATININE 1.47 (H) 01/08/2018 0902   CREATININE 1.2 01/14/2015 1003      Component Value Date/Time   CALCIUM 9.8 03/02/2018 1129   CALCIUM 9.0 01/14/2015 1003   ALKPHOS 50 03/02/2018 1129   ALKPHOS 80 01/14/2015 1003   AST 16 03/02/2018 1129   AST 22 01/08/2018 0902   AST 19 01/14/2015 1003   ALT 14 03/02/2018 1129   ALT 22 01/08/2018 0902   ALT 21 01/14/2015 1003   BILITOT 0.5 03/02/2018 1129   BILITOT 0.3 01/08/2018 0902   BILITOT 0.44 01/14/2015 1003         Impression and Plan:  52 year old man with:  1.  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  He has been steroid refractory and received rituximab with platelet count currently between 30 and 40,000.  His platelet count has dropped to 20,000 last week.  The natural course of this disease was reviewed again and treatment options were reviewed as well.  Splenectomy as well as growth factor support in the form of Nplate was reviewed today.  He has been seen by Edward Clarke for possible splenectomy and I will refer him back to him for evaluation.  We will obtain CT scan of the abdomen and pelvis to assess his spleen size.  2.  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  He  has no signs or symptoms of disease progression and will continue to monitor him periodically.  3.  Diabetes: No issues reported at this time.  He is off steroid therapy.  4.  Follow-up: We will be weekly for labs and 11 MD follow-up and 4 to 6 weeks.  15  minutes was spent with the patient face-to-face today.  More than 50% of time was dedicated to discussing the natural course of his disease, treatment options and coordinating his care.    Zola Button 9/3/20199:17 AM

## 2018-05-15 ENCOUNTER — Inpatient Hospital Stay: Payer: Medicare Other

## 2018-05-15 DIAGNOSIS — D693 Immune thrombocytopenic purpura: Secondary | ICD-10-CM | POA: Diagnosis not present

## 2018-05-15 DIAGNOSIS — E119 Type 2 diabetes mellitus without complications: Secondary | ICD-10-CM | POA: Diagnosis not present

## 2018-05-15 DIAGNOSIS — C880 Waldenstrom macroglobulinemia: Secondary | ICD-10-CM | POA: Diagnosis not present

## 2018-05-15 DIAGNOSIS — Z9221 Personal history of antineoplastic chemotherapy: Secondary | ICD-10-CM | POA: Diagnosis not present

## 2018-05-15 LAB — CBC WITH DIFFERENTIAL (CANCER CENTER ONLY)
BASOS ABS: 0 10*3/uL (ref 0.0–0.1)
BASOS PCT: 0 %
EOS ABS: 0.2 10*3/uL (ref 0.0–0.5)
EOS PCT: 6 %
HCT: 42.8 % (ref 38.4–49.9)
Hemoglobin: 14.1 g/dL (ref 13.0–17.1)
Lymphocytes Relative: 21 %
Lymphs Abs: 0.8 10*3/uL — ABNORMAL LOW (ref 0.9–3.3)
MCH: 28.5 pg (ref 27.2–33.4)
MCHC: 33 g/dL (ref 32.0–36.0)
MCV: 86.4 fL (ref 79.3–98.0)
MONO ABS: 0.5 10*3/uL (ref 0.1–0.9)
Monocytes Relative: 12 %
Neutro Abs: 2.4 10*3/uL (ref 1.5–6.5)
Neutrophils Relative %: 61 %
PLATELETS: 36 10*3/uL — AB (ref 140–400)
RBC: 4.95 MIL/uL (ref 4.20–5.82)
RDW: 13.5 % (ref 11.0–14.6)
WBC Count: 4 10*3/uL (ref 4.0–10.3)

## 2018-05-16 ENCOUNTER — Telehealth: Payer: Self-pay | Admitting: Endocrinology

## 2018-05-16 NOTE — Telephone Encounter (Signed)
Patient requests Edward Clarke call him at [phone] to discuss/go over his medications

## 2018-05-17 ENCOUNTER — Telehealth: Payer: Self-pay | Admitting: *Deleted

## 2018-05-17 NOTE — Telephone Encounter (Signed)
Los to scheduler for Lab appt to be moved on 9/17 from 11:15 to 8:00 am prior to patient's scan at 9:00.

## 2018-05-18 ENCOUNTER — Telehealth: Payer: Self-pay | Admitting: Oncology

## 2018-05-18 NOTE — Telephone Encounter (Signed)
Spoke to pt regarding upcoming appts per 9/12 sch message   °

## 2018-05-22 ENCOUNTER — Inpatient Hospital Stay: Payer: Medicare Other

## 2018-05-22 ENCOUNTER — Other Ambulatory Visit: Payer: Medicare Other

## 2018-05-22 ENCOUNTER — Ambulatory Visit (HOSPITAL_COMMUNITY)
Admission: RE | Admit: 2018-05-22 | Discharge: 2018-05-22 | Disposition: A | Discharge status: Home / Self Care | Payer: Medicare Other | Source: Ambulatory Visit | Attending: Oncology | Admitting: Oncology

## 2018-05-22 ENCOUNTER — Other Ambulatory Visit: Payer: Self-pay | Admitting: *Deleted

## 2018-05-22 ENCOUNTER — Encounter (HOSPITAL_COMMUNITY): Payer: Self-pay

## 2018-05-22 DIAGNOSIS — D693 Immune thrombocytopenic purpura: Secondary | ICD-10-CM

## 2018-05-22 DIAGNOSIS — C8599 Non-Hodgkin lymphoma, unspecified, extranodal and solid organ sites: Secondary | ICD-10-CM

## 2018-05-22 DIAGNOSIS — Z9221 Personal history of antineoplastic chemotherapy: Secondary | ICD-10-CM | POA: Diagnosis not present

## 2018-05-22 DIAGNOSIS — C880 Waldenstrom macroglobulinemia: Secondary | ICD-10-CM | POA: Diagnosis not present

## 2018-05-22 DIAGNOSIS — E119 Type 2 diabetes mellitus without complications: Secondary | ICD-10-CM | POA: Diagnosis not present

## 2018-05-22 DIAGNOSIS — Z8572 Personal history of non-Hodgkin lymphomas: Secondary | ICD-10-CM | POA: Diagnosis not present

## 2018-05-22 LAB — CBC WITH DIFFERENTIAL (CANCER CENTER ONLY)
BASOS ABS: 0 10*3/uL (ref 0.0–0.1)
Basophils Relative: 0 %
Eosinophils Absolute: 0.4 10*3/uL (ref 0.0–0.5)
Eosinophils Relative: 9 %
HCT: 42.3 % (ref 38.4–49.9)
Hemoglobin: 14.1 g/dL (ref 13.0–17.1)
LYMPHS ABS: 0.9 10*3/uL (ref 0.9–3.3)
Lymphocytes Relative: 22 %
MCH: 28.6 pg (ref 27.2–33.4)
MCHC: 33.4 g/dL (ref 32.0–36.0)
MCV: 85.4 fL (ref 79.3–98.0)
MONO ABS: 0.5 10*3/uL (ref 0.1–0.9)
Monocytes Relative: 12 %
NEUTROS ABS: 2.2 10*3/uL (ref 1.5–6.5)
Neutrophils Relative %: 57 %
Platelet Count: 33 10*3/uL — ABNORMAL LOW (ref 140–400)
RBC: 4.95 MIL/uL (ref 4.20–5.82)
RDW: 13.7 % (ref 11.0–14.6)
WBC Count: 3.9 10*3/uL — ABNORMAL LOW (ref 4.0–10.3)

## 2018-05-22 LAB — CMP (CANCER CENTER ONLY)
ALT: 15 U/L (ref 0–44)
AST: 19 U/L (ref 15–41)
Albumin: 4.1 g/dL (ref 3.5–5.0)
Alkaline Phosphatase: 65 U/L (ref 38–126)
Anion gap: 8 (ref 5–15)
BILIRUBIN TOTAL: 0.4 mg/dL (ref 0.3–1.2)
BUN: 12 mg/dL (ref 6–20)
CO2: 25 mmol/L (ref 22–32)
Calcium: 9.7 mg/dL (ref 8.9–10.3)
Chloride: 103 mmol/L (ref 98–111)
Creatinine: 1.46 mg/dL — ABNORMAL HIGH (ref 0.61–1.24)
GFR, EST NON AFRICAN AMERICAN: 53 mL/min — AB (ref >60)
GFR, Est AFR Am: >60 mL/min (ref >60)
Glucose, Bld: 268 mg/dL — ABNORMAL HIGH (ref 70–99)
Potassium: 4.8 mmol/L (ref 3.5–5.1)
Sodium: 136 mmol/L (ref 135–145)
Total Protein: 6.5 g/dL (ref 6.5–8.1)

## 2018-05-22 MED ORDER — SODIUM CHLORIDE 0.9 % IJ SOLN
INTRAMUSCULAR | Status: AC
  Filled 2018-05-22: qty 50

## 2018-05-22 MED ORDER — IOHEXOL 300 MG/ML  SOLN
100.0000 mL | Freq: Once | INTRAMUSCULAR | Status: AC | PRN
  Administered 2018-05-22: 100 mL via INTRAVENOUS

## 2018-05-29 ENCOUNTER — Inpatient Hospital Stay: Payer: Medicare Other

## 2018-05-29 DIAGNOSIS — D693 Immune thrombocytopenic purpura: Secondary | ICD-10-CM

## 2018-05-29 DIAGNOSIS — C880 Waldenstrom macroglobulinemia: Secondary | ICD-10-CM | POA: Diagnosis not present

## 2018-05-29 DIAGNOSIS — E119 Type 2 diabetes mellitus without complications: Secondary | ICD-10-CM | POA: Diagnosis not present

## 2018-05-29 DIAGNOSIS — Z9221 Personal history of antineoplastic chemotherapy: Secondary | ICD-10-CM | POA: Diagnosis not present

## 2018-05-29 LAB — CBC WITH DIFFERENTIAL (CANCER CENTER ONLY)
BASOS PCT: 1 %
Basophils Absolute: 0 10*3/uL (ref 0.0–0.1)
Eosinophils Absolute: 0.3 10*3/uL (ref 0.0–0.5)
Eosinophils Relative: 8 %
HEMATOCRIT: 41.6 % (ref 38.4–49.9)
Hemoglobin: 14.5 g/dL (ref 13.0–17.1)
LYMPHS PCT: 21 %
Lymphs Abs: 0.8 10*3/uL — ABNORMAL LOW (ref 0.9–3.3)
MCH: 29.5 pg (ref 27.2–33.4)
MCHC: 34.9 g/dL (ref 32.0–36.0)
MCV: 84.7 fL (ref 79.3–98.0)
MONO ABS: 0.5 10*3/uL (ref 0.1–0.9)
MONOS PCT: 15 %
NEUTROS ABS: 2 10*3/uL (ref 1.5–6.5)
Neutrophils Relative %: 55 %
Platelet Count: 45 10*3/uL — ABNORMAL LOW (ref 140–400)
RBC: 4.91 MIL/uL (ref 4.20–5.82)
RDW: 13.7 % (ref 11.0–14.6)
WBC: 3.5 10*3/uL — AB (ref 4.0–10.3)

## 2018-05-30 ENCOUNTER — Telehealth: Payer: Self-pay | Admitting: Oncology

## 2018-05-30 NOTE — Telephone Encounter (Signed)
CALL DAY - moved from 10/8 to 10/9. Spoke with patient.

## 2018-06-05 ENCOUNTER — Inpatient Hospital Stay: Payer: Medicare Other | Attending: Oncology

## 2018-06-05 DIAGNOSIS — Z79899 Other long term (current) drug therapy: Secondary | ICD-10-CM | POA: Insufficient documentation

## 2018-06-05 DIAGNOSIS — C880 Waldenstrom macroglobulinemia: Secondary | ICD-10-CM | POA: Diagnosis not present

## 2018-06-05 DIAGNOSIS — Z794 Long term (current) use of insulin: Secondary | ICD-10-CM | POA: Insufficient documentation

## 2018-06-05 DIAGNOSIS — D693 Immune thrombocytopenic purpura: Secondary | ICD-10-CM | POA: Diagnosis not present

## 2018-06-05 DIAGNOSIS — E119 Type 2 diabetes mellitus without complications: Secondary | ICD-10-CM | POA: Diagnosis not present

## 2018-06-05 LAB — CBC WITH DIFFERENTIAL (CANCER CENTER ONLY)
BASOS ABS: 0 10*3/uL (ref 0.0–0.1)
BASOS PCT: 1 %
EOS ABS: 0.3 10*3/uL (ref 0.0–0.5)
EOS PCT: 6 %
HCT: 42.5 % (ref 38.4–49.9)
Hemoglobin: 14.8 g/dL (ref 13.0–17.1)
LYMPHS PCT: 26 %
Lymphs Abs: 1.1 10*3/uL (ref 0.9–3.3)
MCH: 29.2 pg (ref 27.2–33.4)
MCHC: 34.8 g/dL (ref 32.0–36.0)
MCV: 84 fL (ref 79.3–98.0)
MONO ABS: 0.4 10*3/uL (ref 0.1–0.9)
Monocytes Relative: 10 %
Neutro Abs: 2.5 10*3/uL (ref 1.5–6.5)
Neutrophils Relative %: 57 %
PLATELETS: 32 10*3/uL — AB (ref 140–400)
RBC: 5.06 MIL/uL (ref 4.20–5.82)
RDW: 13.8 % (ref 11.0–14.6)
WBC: 4.3 10*3/uL (ref 4.0–10.3)

## 2018-06-11 ENCOUNTER — Other Ambulatory Visit: Payer: Self-pay | Admitting: Endocrinology

## 2018-06-11 ENCOUNTER — Other Ambulatory Visit (INDEPENDENT_AMBULATORY_CARE_PROVIDER_SITE_OTHER): Payer: Medicare Other

## 2018-06-11 ENCOUNTER — Telehealth: Payer: Self-pay | Admitting: *Deleted

## 2018-06-11 ENCOUNTER — Encounter: Payer: Self-pay | Admitting: *Deleted

## 2018-06-11 DIAGNOSIS — Z794 Long term (current) use of insulin: Secondary | ICD-10-CM | POA: Diagnosis not present

## 2018-06-11 DIAGNOSIS — E1165 Type 2 diabetes mellitus with hyperglycemia: Secondary | ICD-10-CM

## 2018-06-11 DIAGNOSIS — D693 Immune thrombocytopenic purpura: Secondary | ICD-10-CM

## 2018-06-11 LAB — CBC WITH DIFFERENTIAL/PLATELET
BASOS PCT: 1.3 % (ref 0.0–3.0)
Basophils Absolute: 0 10*3/uL (ref 0.0–0.1)
Eosinophils Absolute: 0.3 10*3/uL (ref 0.0–0.7)
Eosinophils Relative: 8.1 % — ABNORMAL HIGH (ref 0.0–5.0)
HEMATOCRIT: 43.3 % (ref 39.0–52.0)
Hemoglobin: 14.5 g/dL (ref 13.0–17.0)
LYMPHS PCT: 27.3 % (ref 12.0–46.0)
Lymphs Abs: 1.1 10*3/uL (ref 0.7–4.0)
MCHC: 33.6 g/dL (ref 30.0–36.0)
MCV: 86.5 fl (ref 78.0–100.0)
MONOS PCT: 11.9 % (ref 3.0–12.0)
Monocytes Absolute: 0.5 10*3/uL (ref 0.1–1.0)
NEUTROS ABS: 2 10*3/uL (ref 1.4–7.7)
NEUTROS PCT: 51.4 % (ref 43.0–77.0)
RBC: 5.01 Mil/uL (ref 4.22–5.81)
RDW: 14.1 % (ref 11.5–15.5)
WBC: 3.9 10*3/uL — ABNORMAL LOW (ref 4.0–10.5)

## 2018-06-11 LAB — COMPREHENSIVE METABOLIC PANEL
ALT: 13 U/L (ref 0–53)
AST: 19 U/L (ref 0–37)
Albumin: 4.4 g/dL (ref 3.5–5.2)
Alkaline Phosphatase: 54 U/L (ref 39–117)
BILIRUBIN TOTAL: 0.4 mg/dL (ref 0.2–1.2)
BUN: 18 mg/dL (ref 6–23)
CO2: 29 meq/L (ref 19–32)
Calcium: 9.7 mg/dL (ref 8.4–10.5)
Chloride: 102 mEq/L (ref 96–112)
Creatinine, Ser: 1.44 mg/dL (ref 0.40–1.50)
GFR: 65.85 mL/min (ref >60.00)
GLUCOSE: 197 mg/dL — AB (ref 70–99)
Potassium: 4.7 mEq/L (ref 3.5–5.1)
SODIUM: 136 meq/L (ref 135–145)
TOTAL PROTEIN: 6.7 g/dL (ref 6.0–8.3)

## 2018-06-11 LAB — HEMOGLOBIN A1C: Hgb A1c MFr Bld: 9.8 % — ABNORMAL HIGH (ref 4.6–6.5)

## 2018-06-11 NOTE — Telephone Encounter (Signed)
Patient calling to say he just had a CBC drawn at [facility]. He would like [name] to send order to [facility] @ [phone] @ [facility], And cancel his lab 06/13/18 for a CBC

## 2018-06-12 ENCOUNTER — Other Ambulatory Visit: Payer: Medicare Other

## 2018-06-12 ENCOUNTER — Encounter: Payer: Self-pay | Admitting: Endocrinology

## 2018-06-12 ENCOUNTER — Ambulatory Visit (INDEPENDENT_AMBULATORY_CARE_PROVIDER_SITE_OTHER): Payer: Medicare Other | Admitting: Endocrinology

## 2018-06-12 ENCOUNTER — Other Ambulatory Visit: Payer: Self-pay

## 2018-06-12 ENCOUNTER — Ambulatory Visit: Payer: Medicare Other | Admitting: Oncology

## 2018-06-12 VITALS — BP 136/92 | HR 74 | Ht 75.0 in | Wt 327.0 lb

## 2018-06-12 DIAGNOSIS — Z23 Encounter for immunization: Secondary | ICD-10-CM | POA: Diagnosis not present

## 2018-06-12 DIAGNOSIS — N289 Disorder of kidney and ureter, unspecified: Secondary | ICD-10-CM

## 2018-06-12 DIAGNOSIS — E1165 Type 2 diabetes mellitus with hyperglycemia: Secondary | ICD-10-CM

## 2018-06-12 DIAGNOSIS — I10 Essential (primary) hypertension: Secondary | ICD-10-CM

## 2018-06-12 DIAGNOSIS — Z794 Long term (current) use of insulin: Secondary | ICD-10-CM

## 2018-06-12 MED ORDER — AMLODIPINE BESY-BENAZEPRIL HCL 5-10 MG PO CAPS: 1.0000 | ORAL_CAPSULE | Freq: Every day | ORAL | 2 refills | Status: DC

## 2018-06-12 MED ORDER — LISINOPRIL 20 MG PO TABS: 20.0000 mg | ORAL_TABLET | Freq: Every day | ORAL | 3 refills | Status: DC

## 2018-06-12 MED ORDER — ATORVASTATIN CALCIUM 20 MG PO TABS: ORAL_TABLET | ORAL | 3 refills | Status: AC

## 2018-06-12 NOTE — Telephone Encounter (Signed)
Spoke with the patient and he just needed a refill on lisinopril and atorvastatin I sent them to [facility] on [address] per patient request

## 2018-06-12 NOTE — Progress Notes (Signed)
Patient ID: Edward Clarke, male   DOB: [birth year], 52 y.o.   MRN: [id number]   Reason for Appointment: Diabetes follow-up   History of Present Illness    Diagnosis: Type 2 diabetes mellitus, date of diagnosis: 2005.   He has been on basal bolus insulin regimen in the last few years along with metformin Most of the difficulty with control is related to his noncompliance with diet, medications, glucose monitoring and financial issues Frequently will had been running out of one or the other medication or insulin  and blood sugars would not be well controlled  Recent history:   INSULIN: Toujeo units 50 units once daily, currently none; Apidra insulin 20-30 units before supper  His A1c was 7.3 3 months ago and now 9.8  Current management, blood sugar patterns and problems:  His A1c has gone up significantly because of his running out of Toujeo for at least a month  Despite trying to take extra Apidra his blood sugars are averaging well over 200 throughout the day  He did not notify us about not having his Toujeo from the patient assistance program and only yesterday started with samples  Fasting blood sugars have been as high as 317  If he takes large doses of Apidra at times he will feel hypoglycemic and then overeat  He is also not watching his diet but eating Ramen noodles, sometimes frozen pizza  Also still not motivated to exercise  He has gained weight   Glucometer: One Touch Verio.  Checking blood sugar about once a day  Blood Glucose readings by download analysis:   PRE-MEAL Fasting Lunch Dinner Bedtime Overall  Glucose range:  181-317     76-447  Mean/median:  260  216  219  240 243   Previous average 139  Treatment of hypoglycemia: Usually coffee creamer or food.  Does have glucose tablets at home    Meals: Usually 2 meals a day ( lunch 12-1 PM, supper 9 pm)   Physical activity: exercise: Very little recently    Wt Readings from Last 3  Encounters:  06/12/18 (!) 327 lb (148.3 kg)  05/08/18 (!) 322 lb 11.2 oz (146.4 kg)  03/26/18 (!) 319 lb 8 oz (144.9 kg)     The HbgA1c has been as high as 11.6 previously .  Lab Results  Component Value Date   HGBA1C 9.8 (H) 06/11/2018   HGBA1C 7.3 (H) 03/02/2018   HGBA1C 8.7 12/20/2017   Lab Results  Component Value Date   MICROALBUR <0.7 09/20/2017   LDLCALC 100 (H) 03/02/2018   CREATININE 1.44 06/11/2018    Lab Results  Component Value Date   FRUCTOSAMINE 287 (H) 03/02/2018   FRUCTOSAMINE 317 (H) 09/20/2017   FRUCTOSAMINE 337 (H) 01/02/2017      Allergies as of 06/12/2018      Reactions   Benazepril Other (See Comments)   Reaction unknown - may have been headaches      Medication List        Accurate as of 06/12/18  9:08 PM. Always use your most recent med list.          amLODipine-benazepril 5-10 MG capsule Commonly known as:  LOTREL Take 1 capsule by mouth daily.   atorvastatin 20 MG tablet Commonly known as:  LIPITOR Take one tablet daily   glucose blood test strip USE ONE STRIP TO CHECK GLUCOSE 4 TIMES DAILY Dx code E11.65   ibuprofen 200 MG tablet Commonly known as:  ADVIL,MOTRIN Take  400-800 mg by mouth every 8 (eight) hours as needed for headache. Reported on 02/18/2016   Insulin Glargine 300 UNIT/ML Sopn Inject 50 Units into the skin daily.   Insulin Glulisine 100 UNIT/ML Solostar Pen Commonly known as:  APIDRA Inject 20 Units into the skin 2 (two) times daily.   Insulin Pen Needle 31G X 5 MM Misc Use to inject insulin once daily DX code E11.65   metFORMIN 1000 MG tablet Commonly known as:  GLUCOPHAGE Take 1 tablet (1,000 mg total) by mouth 2 (two) times daily with a meal.   predniSONE 20 MG tablet Commonly known as:  DELTASONE Take as directed.       Allergies:  Allergies  Allergen Reactions  . Benazepril Other (See Comments)    Reaction unknown - may have been headaches    Past Medical History:  Diagnosis Date  .  Diabetes mellitus   . Hyperlipidemia   . Hypertension   . Monoclonal paraproteinemia   . Obesity     Past Surgical History:  Procedure Laterality Date  . APPENDECTOMY    . EYE SURGERY    . TONSILLECTOMY      Family History  Problem Relation Age of Onset  . Diabetes Sister     Social History:  reports that he has quit smoking. His smoking use included cigarettes. He has never used smokeless tobacco. He reports that he drinks about 2.0 standard drinks of alcohol per week. He reports that he has current or past drug history. Drug: Marijuana. Frequency: 7.00 times per week.  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, recommended splenectomy   HYPERLIPIDEMIA:  The lipid abnormality consists of elevated LDL treated with Lipitor  LDL is improved, now taking 20 mg dose   Lab Results  Component Value Date   CHOL 160 03/02/2018   HDL 38.80 (L) 03/02/2018   LDLCALC 100 (H) 03/02/2018   LDLDIRECT 174.0 05/27/2016   TRIG 106.0 03/02/2018   CHOLHDL 4 03/02/2018   Renal dysfunction: His creatinine is relatively higher the last 2 measurements   Lab Results  Component Value Date   CREATININE 1.44 06/11/2018   CREATININE 1.46 (H) 05/22/2018   CREATININE 1.29 03/02/2018    He does not get his eyes checked because of cost of the exam and again discussed need for doing this    Examination:   BP (!) 136/92   Pulse 74   Ht 6\' 3"  (1.905 m)   Wt (!) 327 lb (148.3 kg)   SpO2 96%   BMI 40.87 kg/m   Body mass index is 40.87 kg/m.      Assesment/Plan:   1. Diabetes type 2,  with morbid  obesity  See history of present illness for current blood sugar patterns, management  and problems identified  His A1c is nearly 10%  This is from a combination of not taking basal insulin as well as poor diet, lack of exercise and weight gain   RECOMMENDATIONS:  He will continue 50 units of Toujeo which he has just started  If his fasting readings are  not within the range of 90-130 he will need to let us know for further adjustment  He needs to modify his diet and cut out as much high fat and high carbohydrate foods  Regular exercise  To call if blood sugars not consistently controlled in the next 2 to 3 weeks  We will reorder his patient assistance prescriptions Discussed fasting and postprandial targets   2. HYPERTENSION: Blood pressure is not controlled and will add amlodipine, for simplicity he will go to Lotrel 5/10 Because of his mild increase in creatinine will reduce lisinopril to 10 mg from 20 Recheck microalbumin on the next visit which has been previously normal Blood pressure check in 4 weeks  3. Hyperlipidemia: Previously controlled now and encouraged him to take his 20 mg Lipitor more regularly, however he still has difficulties affording the co-pay and will not refill his medication on time  4.  Mild CKD: Discussed need for better blood pressure control and weight loss  Recommended scheduling eye exam and he thinks he can do this  Influenza vaccine given   Patient Instructions  Check blood sugars on waking up 3 days a week  Also check blood sugars about 2 hours after meals and do this after different meals by rotation  Recommended blood sugar levels on waking up are 90-130 and about 2 hours after meal is 130-160  Please bring your blood sugar monitor to each visit, thank you  No pizza and hi fat meals   Counseling time on subjects discussed in assessment and plan sections is over 50% of today's 25 minute visit        Elayne Snare 06/12/2018, 9:08  PM   Note: This office note was prepared with Dragon voice recognition system technology. Any transcriptional errors that result from this process are unintentional.

## 2018-06-12 NOTE — Patient Instructions (Addendum)
Check blood sugars on waking up 3 days a week  Also check blood sugars about 2 hours after meals and do this after different meals by rotation  Recommended blood sugar levels on waking up are 90-130 and about 2 hours after meal is 130-160  Please bring your blood sugar monitor to each visit, thank you  No pizza and hi fat meals

## 2018-06-13 ENCOUNTER — Inpatient Hospital Stay (HOSPITAL_BASED_OUTPATIENT_CLINIC_OR_DEPARTMENT_OTHER): Payer: Medicare Other | Admitting: Oncology

## 2018-06-13 ENCOUNTER — Inpatient Hospital Stay: Payer: Medicare Other

## 2018-06-13 ENCOUNTER — Telehealth: Payer: Self-pay | Admitting: Oncology

## 2018-06-13 VITALS — BP 155/102 | HR 65 | Temp 98.6°F | Resp 17 | Ht 75.0 in | Wt 326.2 lb

## 2018-06-13 DIAGNOSIS — Z794 Long term (current) use of insulin: Secondary | ICD-10-CM | POA: Diagnosis not present

## 2018-06-13 DIAGNOSIS — D693 Immune thrombocytopenic purpura: Secondary | ICD-10-CM

## 2018-06-13 DIAGNOSIS — C8599 Non-Hodgkin lymphoma, unspecified, extranodal and solid organ sites: Secondary | ICD-10-CM

## 2018-06-13 DIAGNOSIS — Z79899 Other long term (current) drug therapy: Secondary | ICD-10-CM

## 2018-06-13 DIAGNOSIS — C880 Waldenstrom macroglobulinemia: Secondary | ICD-10-CM | POA: Diagnosis not present

## 2018-06-13 DIAGNOSIS — E119 Type 2 diabetes mellitus without complications: Secondary | ICD-10-CM | POA: Diagnosis not present

## 2018-06-13 NOTE — Telephone Encounter (Signed)
Appts scheduled avs/calendar printed per 10/9 los °

## 2018-06-13 NOTE — Progress Notes (Signed)
Hematology and Oncology Follow Up Visit  Edward Clarke [id number] 1966/05/02 52 y.o. 06/13/2018 3:40 PM Patient, No Pcp PerNo ref. 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 .  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    Previous therapy:   Chemotherapy with Bendamustine and Rituxan on 02/14/13. He is S/P cycle 6 on 07/04/2013 with CR.   Status post IVIG completed on 10/20/2017.  Prednisone 80 mg daily started on September 26, 2017.  He is completely tapered off in April 2019.  Rituximab weekly for 4 weeks started on December 12, 2017.  Current therapy:   Active surveillance.   Interim History:  Edward Clarke returns today for a follow-up visit.  Since the last visit, he reports no major changes or concerns.  He remains in good health without any recent hospitalizations or illnesses.  His appetite remained about the same and his weight slightly increased.  He denies any recent hospitalizations or illnesses.  He denies recent infections or recurrent thrombosis.  He denies any bleeding or ecchymosis.  He does not report any headaches, blurry vision, syncope or seizures.  He denies any alteration in mental status or confusion..  Does not report any fevers, chills or sweats. Does not report any chest pain, palpitation, orthopnea or leg edema.    He denies any cough, wheezing or hemoptysis.  Does not report any nausea, vomiting or early satiety. She does not report any change in his bowel habits.   Denies any bone pain or pathological fractures.  Does not report frequency, urgency or hematuria.   Does not report any bleeding or clotting tendency.  He denies any mood changes.  Remaining review of systems is negative.  .  Medications: I have reviewed the patient's current medications.  Current Outpatient Medications  Medication Sig Dispense Refill  . amLODipine-benazepril (LOTREL) 5-10 MG  capsule Take 1 capsule by mouth daily. 30 capsule 2  . atorvastatin (LIPITOR) 20 MG tablet Take one tablet daily 90 tablet 3  . glucose blood (ONETOUCH VERIO) test strip USE ONE STRIP TO CHECK GLUCOSE 4 TIMES DAILY Dx code E11.65 400 each 0  . ibuprofen (ADVIL,MOTRIN) 200 MG tablet Take 400-800 mg by mouth every 8 (eight) hours as needed for headache. Reported on 02/18/2016    . Insulin Glargine (TOUJEO SOLOSTAR) 300 UNIT/ML SOPN Inject 50 Units into the skin daily. 5 pen 2  . Insulin Glulisine (APIDRA SOLOSTAR) 100 UNIT/ML Solostar Pen Inject 20 Units into the skin 2 (two) times daily. 5 pen 2  . Insulin Pen Needle 31G X 5 MM MISC Use to inject insulin once daily DX code E11.65 90 each 3  . metFORMIN (GLUCOPHAGE) 1000 MG tablet Take 1 tablet (1,000 mg total) by mouth 2 (two) times daily with a meal. 60 tablet 3  . predniSONE (DELTASONE) 20 MG tablet Take as directed. 45 tablet 0   No current facility-administered medications for this visit.      Allergies:  Allergies  Allergen Reactions  . Benazepril Other (See Comments)    Reaction unknown - may have been headaches    Past Medical History, Surgical history, Social history, and Family History reviewed today.    Physical Exam:  Blood pressure (!) 155/102, pulse 65, temperature 98.6 F (37 C), temperature source Oral, resp. rate 17, height 6\' 3"  (1.905 m), weight (!) 326 lb 3.2 oz (148 kg), SpO2 99 %.  ECOG: 1   General appearance: Comfortable appearing without any discomfort Head: Normocephalic without any trauma Oropharynx: Mucous membranes are moist and pink without any thrush or ulcers. Eyes: Pupils are equal and round reactive to light. Lymph nodes: No cervical, supraclavicular, inguinal or axillary lymphadenopathy.   Heart:regular rate and rhythm.  S1 and S2 without leg edema. Lung: Clear without any rhonchi or wheezes.  No dullness to percussion. Abdomin: Soft, nontender, nondistended with good bowel sounds.  No  hepatosplenomegaly. Musculoskeletal: No joint deformity or effusion.  Full range of motion noted. Neurological: No deficits noted on motor, sensory and deep tendon reflex exam. Skin: No petechial rash or dryness.  Without ecchymosis or petechiae.      Lab Results: Lab Results  Component Value Date   WBC 3.9 (L) 06/11/2018   HGB 14.5 06/11/2018   HCT 43.3 06/11/2018   MCV 86.5 06/11/2018   PLT 36.0 Repeated and verified X2. (LL) 06/11/2018     Chemistry      Component Value Date/Time   NA 136 06/11/2018 0946   NA 139 01/14/2015 1003   K 4.7 06/11/2018 0946   K 4.3 01/14/2015 1003   CL 102 06/11/2018 0946   CL 101 02/14/2013 0758   CO2 29 06/11/2018 0946   CO2 27 01/14/2015 1003   BUN 18 06/11/2018 0946   BUN 8.1 01/14/2015 1003   CREATININE 1.44 06/11/2018 0946   CREATININE 1.46 (H) 05/22/2018 0815   CREATININE 1.2 01/14/2015 1003      Component Value Date/Time   CALCIUM 9.7 06/11/2018 0946   CALCIUM 9.0 01/14/2015 1003   ALKPHOS 54 06/11/2018 0946   ALKPHOS 80 01/14/2015 1003   AST 19 06/11/2018 0946   AST 19 05/22/2018 0815   AST 19 01/14/2015 1003   ALT 13 06/11/2018 0946   ALT 15 05/22/2018 0815   ALT 21 01/14/2015 1003   BILITOT 0.4 06/11/2018 0946   BILITOT 0.4 05/22/2018 0815   BILITOT 0.44 01/14/2015 1003      IMPRESSION: 1. Borderline left external iliac and left inguinal lymph nodes are again noted, improved from previous exam as detailed above. 2. No new or progressive sites of disease identified. 3. Decrease in size of spleen.   Impression and Plan:  52 year old man with:  1.  ITP presented in January 2019 with a platelet count of last than 5.  He is status post treatment outlined above  He remains on active surveillance at this time with platelet count of range between 30 and 40,000 without any active bleeding.  Options of therapy was reviewed again today which includes continued active surveillance, consideration for splenectomy versus  platelet stimulating factors such as Nplate.   After discussion today, the plan is to continue with active surveillance and consider splenectomy if his platelets can start to drop again.  We will also consider IVIG for temporary increase in his platelet count if needed for a dental procedure.  2.  Lymphoplasmacytic lymphoma (Waldenstrm's macroglobulinemia ) diagnosed in 2014: Teen studies obtained in January 2019 continues to show complete response to therapy.  CT scan of the abdomen and pelvis showed no evidence of lymphadenopathy or splenomegaly.  His spleen has these regressed in size.   3.  Diabetes: No recent exacerbations noted.  4.  Follow-up: We will be weekly for CBC and in 6 weeks for MD follow-up.  15  minutes was spent with the patient face-to-face today.  More than 50% of time was dedicated to reviewing the natural course  of his disease, treatment options and managing complications of care.    Zola Button 10/9/20193:40 PM

## 2018-06-19 ENCOUNTER — Inpatient Hospital Stay: Payer: Medicare Other

## 2018-06-20 ENCOUNTER — Encounter: Payer: Self-pay | Admitting: Oncology

## 2018-06-26 ENCOUNTER — Inpatient Hospital Stay: Payer: Medicare Other

## 2018-06-26 DIAGNOSIS — E119 Type 2 diabetes mellitus without complications: Secondary | ICD-10-CM | POA: Diagnosis not present

## 2018-06-26 DIAGNOSIS — C880 Waldenstrom macroglobulinemia: Secondary | ICD-10-CM | POA: Diagnosis not present

## 2018-06-26 DIAGNOSIS — D693 Immune thrombocytopenic purpura: Secondary | ICD-10-CM | POA: Diagnosis not present

## 2018-06-26 DIAGNOSIS — Z794 Long term (current) use of insulin: Secondary | ICD-10-CM | POA: Diagnosis not present

## 2018-06-26 DIAGNOSIS — Z79899 Other long term (current) drug therapy: Secondary | ICD-10-CM | POA: Diagnosis not present

## 2018-06-26 LAB — CBC WITH DIFFERENTIAL (CANCER CENTER ONLY)
Abs Immature Granulocytes: 0.04 10*3/uL (ref 0.00–0.07)
BASOS ABS: 0.1 10*3/uL (ref 0.0–0.1)
Basophils Relative: 1 %
EOS ABS: 0.8 10*3/uL — AB (ref 0.0–0.5)
Eosinophils Relative: 15 %
HCT: 43.1 % (ref 39.0–52.0)
Hemoglobin: 14.7 g/dL (ref 13.0–17.0)
IMMATURE GRANULOCYTES: 1 %
Lymphocytes Relative: 22 %
Lymphs Abs: 1.2 10*3/uL (ref 0.7–4.0)
MCH: 29 pg (ref 26.0–34.0)
MCHC: 34.1 g/dL (ref 30.0–36.0)
MCV: 85 fL (ref 80.0–100.0)
Monocytes Absolute: 0.6 10*3/uL (ref 0.1–1.0)
Monocytes Relative: 11 %
NEUTROS PCT: 50 %
NRBC: 0 % (ref 0.0–0.2)
Neutro Abs: 2.6 10*3/uL (ref 1.7–7.7)
PLATELETS: 35 10*3/uL — AB (ref 150–400)
RBC: 5.07 MIL/uL (ref 4.22–5.81)
RDW: 13.2 % (ref 11.5–15.5)
WBC: 5.3 10*3/uL (ref 4.0–10.5)

## 2018-07-03 ENCOUNTER — Inpatient Hospital Stay: Payer: Medicare Other

## 2018-07-03 DIAGNOSIS — E119 Type 2 diabetes mellitus without complications: Secondary | ICD-10-CM | POA: Diagnosis not present

## 2018-07-03 DIAGNOSIS — D693 Immune thrombocytopenic purpura: Secondary | ICD-10-CM | POA: Diagnosis not present

## 2018-07-03 DIAGNOSIS — Z794 Long term (current) use of insulin: Secondary | ICD-10-CM | POA: Diagnosis not present

## 2018-07-03 DIAGNOSIS — C880 Waldenstrom macroglobulinemia: Secondary | ICD-10-CM | POA: Diagnosis not present

## 2018-07-03 DIAGNOSIS — Z79899 Other long term (current) drug therapy: Secondary | ICD-10-CM | POA: Diagnosis not present

## 2018-07-03 LAB — CBC WITH DIFFERENTIAL (CANCER CENTER ONLY)
ABS IMMATURE GRANULOCYTES: 0.03 10*3/uL (ref 0.00–0.07)
BASOS PCT: 2 %
Basophils Absolute: 0.1 10*3/uL (ref 0.0–0.1)
EOS PCT: 14 %
Eosinophils Absolute: 0.6 10*3/uL — ABNORMAL HIGH (ref 0.0–0.5)
HCT: 43 % (ref 39.0–52.0)
Hemoglobin: 14.9 g/dL (ref 13.0–17.0)
IMMATURE GRANULOCYTES: 1 %
LYMPHS PCT: 22 %
Lymphs Abs: 1 10*3/uL (ref 0.7–4.0)
MCH: 28.7 pg (ref 26.0–34.0)
MCHC: 34.7 g/dL (ref 30.0–36.0)
MCV: 82.7 fL (ref 80.0–100.0)
MONO ABS: 0.5 10*3/uL (ref 0.1–1.0)
MONOS PCT: 10 %
NEUTROS ABS: 2.2 10*3/uL (ref 1.7–7.7)
Neutrophils Relative %: 51 %
PLATELETS: 51 10*3/uL — AB (ref 150–400)
RBC: 5.2 MIL/uL (ref 4.22–5.81)
RDW: 12.7 % (ref 11.5–15.5)
WBC: 4.6 10*3/uL (ref 4.0–10.5)
nRBC: 0 % (ref 0.0–0.2)

## 2018-07-09 ENCOUNTER — Inpatient Hospital Stay: Payer: Medicare Other | Attending: Oncology

## 2018-07-09 DIAGNOSIS — Z79899 Other long term (current) drug therapy: Secondary | ICD-10-CM | POA: Diagnosis not present

## 2018-07-09 DIAGNOSIS — C880 Waldenstrom macroglobulinemia: Secondary | ICD-10-CM | POA: Diagnosis not present

## 2018-07-09 DIAGNOSIS — Z794 Long term (current) use of insulin: Secondary | ICD-10-CM | POA: Insufficient documentation

## 2018-07-09 DIAGNOSIS — E119 Type 2 diabetes mellitus without complications: Secondary | ICD-10-CM | POA: Diagnosis not present

## 2018-07-09 DIAGNOSIS — D693 Immune thrombocytopenic purpura: Secondary | ICD-10-CM | POA: Insufficient documentation

## 2018-07-09 DIAGNOSIS — Z9221 Personal history of antineoplastic chemotherapy: Secondary | ICD-10-CM | POA: Diagnosis not present

## 2018-07-09 LAB — CBC WITH DIFFERENTIAL (CANCER CENTER ONLY)
ABS IMMATURE GRANULOCYTES: 0.02 10*3/uL (ref 0.00–0.07)
Basophils Absolute: 0.1 10*3/uL (ref 0.0–0.1)
Basophils Relative: 1 %
Eosinophils Absolute: 1.8 10*3/uL — ABNORMAL HIGH (ref 0.0–0.5)
Eosinophils Relative: 29 %
HEMATOCRIT: 44.1 % (ref 39.0–52.0)
HEMOGLOBIN: 14.8 g/dL (ref 13.0–17.0)
IMMATURE GRANULOCYTES: 0 %
LYMPHS ABS: 1.3 10*3/uL (ref 0.7–4.0)
Lymphocytes Relative: 20 %
MCH: 28.7 pg (ref 26.0–34.0)
MCHC: 33.6 g/dL (ref 30.0–36.0)
MCV: 85.6 fL (ref 80.0–100.0)
MONO ABS: 0.5 10*3/uL (ref 0.1–1.0)
MONOS PCT: 8 %
NEUTROS ABS: 2.7 10*3/uL (ref 1.7–7.7)
NEUTROS PCT: 42 %
Platelet Count: 66 10*3/uL — ABNORMAL LOW (ref 150–400)
RBC: 5.15 MIL/uL (ref 4.22–5.81)
RDW: 12.8 % (ref 11.5–15.5)
WBC Count: 6.5 10*3/uL (ref 4.0–10.5)
nRBC: 0 % (ref 0.0–0.2)

## 2018-07-10 ENCOUNTER — Encounter: Payer: Self-pay | Admitting: Endocrinology

## 2018-07-10 ENCOUNTER — Ambulatory Visit (INDEPENDENT_AMBULATORY_CARE_PROVIDER_SITE_OTHER): Payer: Medicare Other | Admitting: Endocrinology

## 2018-07-10 VITALS — BP 142/86 | HR 62 | Ht 74.0 in | Wt 324.0 lb

## 2018-07-10 DIAGNOSIS — I10 Essential (primary) hypertension: Secondary | ICD-10-CM | POA: Diagnosis not present

## 2018-07-10 DIAGNOSIS — Z794 Long term (current) use of insulin: Secondary | ICD-10-CM

## 2018-07-10 DIAGNOSIS — E1165 Type 2 diabetes mellitus with hyperglycemia: Secondary | ICD-10-CM

## 2018-07-10 DIAGNOSIS — E782 Mixed hyperlipidemia: Secondary | ICD-10-CM

## 2018-07-10 MED ORDER — LISINOPRIL 10 MG PO TABS: 10.0000 mg | ORAL_TABLET | Freq: Every day | ORAL | 3 refills | Status: AC

## 2018-07-10 NOTE — Progress Notes (Signed)
Patient ID: Edward Clarke, male   DOB: 09/26/1965, 52 y.o.   MRN: [id number]   Reason for Appointment: Diabetes follow-up   History of Present Illness    Diagnosis: Type 2 diabetes mellitus, date of diagnosis: 2005.   He has been on basal bolus insulin regimen in the last few years along with metformin Most of the difficulty with control is related to his noncompliance with diet, medications, glucose monitoring and financial issues Frequently will had been running out of one or the other medication or insulin  and blood sugars would not be well controlled  Recent history:   INSULIN: Toujeo units 50 units once daily, currently none; Apidra insulin 20-25 units before supper  His A1c was 9.8 last month  Current management, blood sugar patterns and problems:  His blood sugars are improved from last time when he was not taking Toujeo but still not controlled  Fasting readings are still high  He says that sometimes he may feel hypoglycemic midmorning and then will overeat.  Also if he has a low sugar late at night he will treated with food and not any simple sugars  Blood sugars are also relatively high before dinnertime from late afternoon snacks which he does not cover with Apidra  Has only a few readings after supper late at night these are variable  No labs done to objectively assess his level of control  He says that he is doing a little better with cutting out on frozen pizza that he was eating twice a week but has lost only 1 pound  Glucometer: One Prob[employer].  Checking blood sugar about 1-2x a day  Blood Glucose readings by download analysis:   PRE-MEAL Fasting Lunch Dinner Bedtime Overall  Glucose range:  118-226   134-295    Mean/median:  181   214  180   POST-MEAL PC Breakfast PC Lunch PC Dinner  Glucose range:    67-267  Mean/median:      Previous readings:  PRE-MEAL Fasting Lunch Dinner Bedtime Overall  Glucose range:  181-317     76-447    Mean/median:  260  216  219  240 243     Treatment of hypoglycemia: Usually coffee creamer or food.  Does have glucose tablets at home    Meals: Usually 2 meals a day ( lunch 12-1 PM, supper 9 pm)   Physical activity: exercise: Only sporadically    Wt Readings from Last 3 Encounters:  07/10/18 (!) 324 lb (147 kg)  06/13/18 (!) 326 lb 3.2 oz (148 kg)  06/12/18 (!) 327 lb (148.3 kg)     The HbgA1c has been as high as 11.6 previously .  Lab Results  Component Value Date   HGBA1C 9.8 (H) 06/11/2018   HGBA1C 7.3 (H) 03/02/2018   HGBA1C 8.7 12/20/2017   Lab Results  Component Value Date   MICROALBUR <0.7 09/20/2017   LDLCALC 100 (H) 03/02/2018   CREATININE 1.44 06/11/2018    Lab Results  Component Value Date   FRUCTOSAMINE 287 (H) 03/02/2018   FRUCTOSAMINE 317 (H) 09/20/2017   FRUCTOSAMINE 337 (H) 01/02/2017      Allergies as of 07/10/2018      Reactions   Benazepril Other (See Comments)   Reaction unknown - may have been headaches      Medication List        Accurate as of 07/10/18  8:11 AM. Always use your most recent med list.  amLODipine-benazepril 5-10 MG capsule Commonly known as:  LOTREL Take 1 capsule by mouth daily.   atorvastatin 20 MG tablet Commonly known as:  LIPITOR Take one tablet daily   glucose blood test strip USE ONE STRIP TO CHECK GLUCOSE 4 TIMES DAILY Dx code E11.65   ibuprofen 200 MG tablet Commonly known as:  ADVIL,MOTRIN Take 400-800 mg by mouth every 8 (eight) hours as needed for headache. Reported on 02/18/2016   Insulin Glargine 300 UNIT/ML Sopn Inject 50 Units into the skin daily.   Insulin Glulisine 100 UNIT/ML Solostar Pen Commonly known as:  APIDRA Inject 20 Units into the skin 2 (two) times daily.   Insulin Pen Needle 31G X 5 MM Misc Use to inject insulin once daily DX code E11.65   metFORMIN 1000 MG tablet Commonly known as:  GLUCOPHAGE Take 1 tablet (1,000 mg total) by mouth 2 (two) times daily with a  meal.       Allergies:  Allergies  Allergen Reactions  . Benazepril Other (See Comments)    Reaction unknown - may have been headaches    Past Medical History:  Diagnosis Date  . Diabetes mellitus   . Hyperlipidemia   . Hypertension   . Monoclonal paraproteinemia   . Obesity     Past Surgical History:  Procedure Laterality Date  . APPENDECTOMY    . EYE SURGERY    . TONSILLECTOMY      Family History  Problem Relation Age of Onset  . Diabetes Sister     Social History:  reports that he has quit smoking. His smoking use included cigarettes. He has never used smokeless tobacco. He reports that he drinks about 2.0 standard drinks of alcohol per week. He reports that he has current or past drug history. Drug: Marijuana. Frequency: 7.00 times per week.  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, this is improving and likely will not need splenectomy   HYPERLIPIDEMIA:  The lipid abnormality consists of elevated LDL treated with Lipitor  LDL is improved, now taking 20 mg dose and recently did refill his medication   Lab Results  Component Value Date   CHOL 160 03/02/2018   HDL 38.80 (L) 03/02/2018   LDLCALC 100 (H) 03/02/2018   LDLDIRECT 174.0 05/27/2016   TRIG 106.0 03/02/2018   CHOLHDL 4 03/02/2018   Renal dysfunction: His creatinine is relatively higher the last 2 measurements   Lab Results  Component Value Date   CREATININE 1.44 06/11/2018   CREATININE 1.46 (H) 05/22/2018   CREATININE 1.29 03/02/2018    He does not get his eyes checked because of  cost of the exam and again discussed need for doing this    Examination:   BP (!) 142/86   Pulse 62   Ht 6\' 2"  (1.88 m)   Wt (!) 324 lb (147 kg)   BMI 41.60 kg/m   Body mass index is 41.6 kg/m.      Assesment/Plan:   1. Diabetes type 2,  with morbid obesity  See history of present illness for current blood  sugar patterns, management  and problems identified     RECOMMENDATIONS:  He will increase the dose of Toujeo to 54 units  He does need to check sugars more consistently after evening meal to help adjust her mealtime dose  To take 5 to 10 units to cover his late afternoon snack  Discussed that eating a peanut butter sandwich late in the afternoon is considered a small meal and he needs to take Apidra for this consistently  Discussed fasting blood sugar targets Start regular exercise   2. HYPERTENSION: Blood pressure is not controlled  We will start lisinopril, this may be adequate since his blood pressure is improving possibly with some dietary changes  3. Hyperlipidemia: He has back on the Lipitor and will need to check efficacy of this on the next visit   Recommended scheduling eye exam again     There are no Patient Instructions on file for this visit.          Elayne Snare 07/10/2018, 8:11 AM   Note: This office note was prepared with Dragon voice recognition system technology. Any transcriptional errors that result from this process are unintentional.

## 2018-07-10 NOTE — Patient Instructions (Addendum)
Toujeo 54 units daily in pm  Take 5-10 units Apidra for snack in afternoon  Restart exercise  Check blood sugars on waking up days a week  Also check blood sugars about 2 hours after meals and do this after different meals by rotation  Recommended blood sugar levels on waking up are 90-130 and about 2 hours after meal is 130-160  Please bring your blood sugar monitor to each visit, thank you  Raisins for lows

## 2018-07-12 ENCOUNTER — Telehealth: Payer: Self-pay | Admitting: Endocrinology

## 2018-07-12 NOTE — Telephone Encounter (Signed)
[employer]---pt assistant program refill request--faxed.

## 2018-07-16 ENCOUNTER — Inpatient Hospital Stay: Payer: Medicare Other

## 2018-07-16 DIAGNOSIS — D693 Immune thrombocytopenic purpura: Secondary | ICD-10-CM

## 2018-07-16 DIAGNOSIS — Z794 Long term (current) use of insulin: Secondary | ICD-10-CM | POA: Diagnosis not present

## 2018-07-16 DIAGNOSIS — E119 Type 2 diabetes mellitus without complications: Secondary | ICD-10-CM | POA: Diagnosis not present

## 2018-07-16 DIAGNOSIS — Z9221 Personal history of antineoplastic chemotherapy: Secondary | ICD-10-CM | POA: Diagnosis not present

## 2018-07-16 DIAGNOSIS — C880 Waldenstrom macroglobulinemia: Secondary | ICD-10-CM | POA: Diagnosis not present

## 2018-07-16 DIAGNOSIS — Z79899 Other long term (current) drug therapy: Secondary | ICD-10-CM | POA: Diagnosis not present

## 2018-07-16 LAB — CBC WITH DIFFERENTIAL (CANCER CENTER ONLY)
ABS IMMATURE GRANULOCYTES: 0.05 10*3/uL (ref 0.00–0.07)
BASOS PCT: 1 %
Basophils Absolute: 0.1 10*3/uL (ref 0.0–0.1)
EOS ABS: 1.3 10*3/uL — AB (ref 0.0–0.5)
EOS PCT: 26 %
HCT: 43 % (ref 39.0–52.0)
Hemoglobin: 14.5 g/dL (ref 13.0–17.0)
Immature Granulocytes: 1 %
Lymphocytes Relative: 22 %
Lymphs Abs: 1.1 10*3/uL (ref 0.7–4.0)
MCH: 28.7 pg (ref 26.0–34.0)
MCHC: 33.7 g/dL (ref 30.0–36.0)
MCV: 85.1 fL (ref 80.0–100.0)
MONO ABS: 0.5 10*3/uL (ref 0.1–1.0)
MONOS PCT: 10 %
Neutro Abs: 2.1 10*3/uL (ref 1.7–7.7)
Neutrophils Relative %: 40 %
PLATELETS: 38 10*3/uL — AB (ref 150–400)
RBC: 5.05 MIL/uL (ref 4.22–5.81)
RDW: 12.8 % (ref 11.5–15.5)
WBC: 5.1 10*3/uL (ref 4.0–10.5)
nRBC: 0 % (ref 0.0–0.2)

## 2018-07-19 ENCOUNTER — Telehealth: Payer: Self-pay | Admitting: Endocrinology

## 2018-07-19 NOTE — Telephone Encounter (Signed)
Notified pt Rx toujeo 300 units/ml  3 boxes--[facility]--[facility]-assistant program arrived today and ready to be pick up at the office.

## 2018-07-24 ENCOUNTER — Inpatient Hospital Stay (HOSPITAL_BASED_OUTPATIENT_CLINIC_OR_DEPARTMENT_OTHER): Payer: Medicare Other | Admitting: Oncology

## 2018-07-24 ENCOUNTER — Telehealth: Payer: Self-pay | Admitting: Oncology

## 2018-07-24 ENCOUNTER — Inpatient Hospital Stay: Payer: Medicare Other

## 2018-07-24 VITALS — BP 138/81 | HR 67 | Temp 97.9°F | Resp 17 | Ht 74.0 in | Wt 323.8 lb

## 2018-07-24 DIAGNOSIS — Z9221 Personal history of antineoplastic chemotherapy: Secondary | ICD-10-CM | POA: Diagnosis not present

## 2018-07-24 DIAGNOSIS — D693 Immune thrombocytopenic purpura: Secondary | ICD-10-CM | POA: Diagnosis not present

## 2018-07-24 DIAGNOSIS — Z79899 Other long term (current) drug therapy: Secondary | ICD-10-CM

## 2018-07-24 DIAGNOSIS — E119 Type 2 diabetes mellitus without complications: Secondary | ICD-10-CM | POA: Diagnosis not present

## 2018-07-24 DIAGNOSIS — C8599 Non-Hodgkin lymphoma, unspecified, extranodal and solid organ sites: Secondary | ICD-10-CM

## 2018-07-24 DIAGNOSIS — C880 Waldenstrom macroglobulinemia: Secondary | ICD-10-CM | POA: Diagnosis not present

## 2018-07-24 DIAGNOSIS — Z794 Long term (current) use of insulin: Secondary | ICD-10-CM | POA: Diagnosis not present

## 2018-07-24 LAB — CBC WITH DIFFERENTIAL (CANCER CENTER ONLY)
ABS IMMATURE GRANULOCYTES: 0.02 10*3/uL (ref 0.00–0.07)
BASOS ABS: 0.1 10*3/uL (ref 0.0–0.1)
Basophils Relative: 1 %
EOS PCT: 8 %
Eosinophils Absolute: 0.4 10*3/uL (ref 0.0–0.5)
HEMATOCRIT: 43 % (ref 39.0–52.0)
HEMOGLOBIN: 14.5 g/dL (ref 13.0–17.0)
IMMATURE GRANULOCYTES: 0 %
LYMPHS ABS: 1 10*3/uL (ref 0.7–4.0)
LYMPHS PCT: 22 %
MCH: 29.1 pg (ref 26.0–34.0)
MCHC: 33.7 g/dL (ref 30.0–36.0)
MCV: 86.2 fL (ref 80.0–100.0)
Monocytes Absolute: 0.4 10*3/uL (ref 0.1–1.0)
Monocytes Relative: 10 %
NEUTROS ABS: 2.7 10*3/uL (ref 1.7–7.7)
NEUTROS PCT: 59 %
NRBC: 0 % (ref 0.0–0.2)
Platelet Count: 39 10*3/uL — ABNORMAL LOW (ref 150–400)
RBC: 4.99 MIL/uL (ref 4.22–5.81)
RDW: 12.5 % (ref 11.5–15.5)
WBC: 4.6 10*3/uL (ref 4.0–10.5)

## 2018-07-24 NOTE — Telephone Encounter (Signed)
Printed calendar and avs. °

## 2018-07-24 NOTE — Progress Notes (Signed)
Hematology and Oncology Follow Up Visit  Edward Clarke [id number] 07/19/1966 52 y.o. 07/24/2018 3:38 PM Patient, No Pcp PerNo ref. provider found   Principle Diagnosis: 52 year old man with:  1.  Lymphoplasmacytic lymphoma (Waldenstrm's macroglobulinemia) diagnosed in 2014.  He was found to have elevated IgM and anemia.  He remains in remission at this time.  2.  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    Previous therapy:   Chemotherapy with Bendamustine and Rituxan on 02/14/13. He is S/P cycle 6 on 07/04/2013 with CR.   Status post IVIG completed on 10/20/2017.  Prednisone 80 mg daily started on September 26, 2017.  He is completely tapered off in April 2019.  Rituximab weekly for 4 weeks started on December 12, 2017.  Current therapy:   Active surveillance.   Interim History:  Edward Clarke returns today for a follow-up visit.  Since the last visit, he reports no major changes in his health.  He has cut down on his alcohol drinking and have been eating healthier.  Despite that his weight has not really changed dramatically.  He denies any active bleeding including hematochezia, melena or epistaxis.  He denies any hemoptysis or hematemesis.  His performance status and quality of life remain excellent.  He does not report any headaches, blurry vision, syncope or seizures.  He denies any confusion or lethargy.  Does not report any fevers, chills or sweats. Does not report any chest pain, palpitation, orthopnea or leg edema.    He denies any cough, wheezing or hemoptysis.  Does not report any nausea, vomiting or abdominal pain.  He denies any constipation or diarrhea.  Denies any arthralgias or myalgias.  Does not report frequency, urgency or hematuria.   Does not report any ecchymosis or petechiae.  He denies any anxiety or depression.  Remaining review of systems is negative.  .  Medications: I have reviewed the patient's current medications.  Current Outpatient  Medications  Medication Sig Dispense Refill  . atorvastatin (LIPITOR) 20 MG tablet Take one tablet daily 90 tablet 3  . glucose blood (ONETOUCH VERIO) test strip USE ONE STRIP TO CHECK GLUCOSE 4 TIMES DAILY Dx code E11.65 400 each 0  . ibuprofen (ADVIL,MOTRIN) 200 MG tablet Take 400-800 mg by mouth every 8 (eight) hours as needed for headache. Reported on 02/18/2016    . Insulin Glargine (TOUJEO SOLOSTAR) 300 UNIT/ML SOPN Inject 50 Units into the skin daily. 5 pen 2  . Insulin Glulisine (APIDRA SOLOSTAR) 100 UNIT/ML Solostar Pen Inject 20 Units into the skin 2 (two) times daily. 5 pen 2  . Insulin Pen Needle 31G X 5 MM MISC Use to inject insulin once daily DX code E11.65 90 each 3  . lisinopril (PRINIVIL,ZESTRIL) 10 MG tablet Take 1 tablet (10 mg total) by mouth daily. 90 tablet 3  . metFORMIN (GLUCOPHAGE) 1000 MG tablet Take 1 tablet (1,000 mg total) by mouth 2 (two) times daily with a meal. 60 tablet 3   No current facility-administered medications for this visit.      Allergies:  Allergies  Allergen Reactions  . Benazepril Other (See Comments)    Reaction unknown - may have been headaches    Past Medical History, Surgical history, Social history, and Family History reviewed today.    Physical Exam:   Blood pressure 138/81, pulse 67, temperature 97.9 F (36.6 C), temperature source Oral, resp. rate 17, height 6\' 2"  (1.88 m), weight (!) 323 lb 12.8 oz (146.9 kg), SpO2 100 %.  ECOG: 1   General appearance: Alert, awake without any distress. Head: Atraumatic without abnormalities Oropharynx: Without any thrush or ulcers. Eyes: No scleral icterus. Lymph nodes: No lymphadenopathy noted in the cervical, supraclavicular, or axillary nodes Heart:regular rate and rhythm, without any murmurs or gallops.   Lung: Clear to auscultation without any rhonchi, wheezes or dullness to percussion. Abdomin: Soft, nontender without any shifting dullness or ascites. Musculoskeletal: No  clubbing or cyanosis. Neurological: No motor or sensory deficits. Skin: No rashes or lesions.      Lab Results: Lab Results  Component Value Date   WBC 4.6 07/24/2018   HGB 14.5 07/24/2018   HCT 43.0 07/24/2018   MCV 86.2 07/24/2018   PLT 39 (L) 07/24/2018     Chemistry      Component Value Date/Time   NA 136 06/11/2018 0946   NA 139 01/14/2015 1003   K 4.7 06/11/2018 0946   K 4.3 01/14/2015 1003   CL 102 06/11/2018 0946   CL 101 02/14/2013 0758   CO2 29 06/11/2018 0946   CO2 27 01/14/2015 1003   BUN 18 06/11/2018 0946   BUN 8.1 01/14/2015 1003   CREATININE 1.44 06/11/2018 0946   CREATININE 1.46 (H) 05/22/2018 0815   CREATININE 1.2 01/14/2015 1003      Component Value Date/Time   CALCIUM 9.7 06/11/2018 0946   CALCIUM 9.0 01/14/2015 1003   ALKPHOS 54 06/11/2018 0946   ALKPHOS 80 01/14/2015 1003   AST 19 06/11/2018 0946   AST 19 05/22/2018 0815   AST 19 01/14/2015 1003   ALT 13 06/11/2018 0946   ALT 15 05/22/2018 0815   ALT 21 01/14/2015 1003   BILITOT 0.4 06/11/2018 0946   BILITOT 0.4 05/22/2018 0815   BILITOT 0.44 01/14/2015 1003        Impression and Plan:  52 year old man with:  1.  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  He status post the therapy outlined above and platelet count remains adequate at this time.  His platelet count continues to be above 30,000 without any active bleeding.  Indication for splenectomy was reiterated today which includes worsening thrombocytopenia or active bleeding.  At this time I see no need for splenectomy or repeat IVIG unless active bleeding is noted.   2.  Lymphoplasmacytic lymphoma (Waldenstrm's macroglobulinemia ) currently in remission: Plan is to continue with active surveillance and repeat protein studies in January 2020..   3.  Diabetes: No issues reported since the last visit.  Sugar remains under control  4.  Follow-up: Every 2 weeks for repeat CBC in  6 weeks for MD follow-up.  15  minutes was spent with the patient face-to-face today.  More than 50% of time was spent on reviewing laboratory data, differential diagnosis, treatment options and complications related to therapy.    Zola Button 11/19/20193:38 PM

## 2018-08-07 ENCOUNTER — Inpatient Hospital Stay: Payer: Medicare Other | Attending: Oncology

## 2018-08-07 DIAGNOSIS — C880 Waldenstrom macroglobulinemia: Secondary | ICD-10-CM | POA: Insufficient documentation

## 2018-08-07 DIAGNOSIS — D693 Immune thrombocytopenic purpura: Secondary | ICD-10-CM

## 2018-08-07 LAB — CBC WITH DIFFERENTIAL (CANCER CENTER ONLY)
ABS IMMATURE GRANULOCYTES: 0.02 10*3/uL (ref 0.00–0.07)
Basophils Absolute: 0.1 10*3/uL (ref 0.0–0.1)
Basophils Relative: 1 %
EOS PCT: 12 %
Eosinophils Absolute: 0.5 10*3/uL (ref 0.0–0.5)
HEMATOCRIT: 39.9 % (ref 39.0–52.0)
Hemoglobin: 13.9 g/dL (ref 13.0–17.0)
Immature Granulocytes: 1 %
Lymphocytes Relative: 22 %
Lymphs Abs: 1 10*3/uL (ref 0.7–4.0)
MCH: 29.2 pg (ref 26.0–34.0)
MCHC: 34.8 g/dL (ref 30.0–36.0)
MCV: 83.8 fL (ref 80.0–100.0)
MONO ABS: 0.5 10*3/uL (ref 0.1–1.0)
Monocytes Relative: 11 %
NEUTROS ABS: 2.3 10*3/uL (ref 1.7–7.7)
NEUTROS PCT: 53 %
NRBC: 0 % (ref 0.0–0.2)
Platelet Count: 40 10*3/uL — ABNORMAL LOW (ref 150–400)
RBC: 4.76 MIL/uL (ref 4.22–5.81)
RDW: 12.3 % (ref 11.5–15.5)
WBC Count: 4.4 10*3/uL (ref 4.0–10.5)

## 2018-08-21 ENCOUNTER — Inpatient Hospital Stay: Payer: Medicare Other

## 2018-08-21 DIAGNOSIS — D693 Immune thrombocytopenic purpura: Secondary | ICD-10-CM

## 2018-08-21 DIAGNOSIS — C880 Waldenstrom macroglobulinemia: Secondary | ICD-10-CM | POA: Diagnosis not present

## 2018-08-21 LAB — CBC WITH DIFFERENTIAL (CANCER CENTER ONLY)
ABS IMMATURE GRANULOCYTES: 0.01 10*3/uL (ref 0.00–0.07)
BASOS ABS: 0.1 10*3/uL (ref 0.0–0.1)
Basophils Relative: 1 %
EOS PCT: 11 %
Eosinophils Absolute: 0.5 10*3/uL (ref 0.0–0.5)
HEMATOCRIT: 40.8 % (ref 39.0–52.0)
HEMOGLOBIN: 13.8 g/dL (ref 13.0–17.0)
IMMATURE GRANULOCYTES: 0 %
LYMPHS ABS: 1 10*3/uL (ref 0.7–4.0)
LYMPHS PCT: 24 %
MCH: 28.8 pg (ref 26.0–34.0)
MCHC: 33.8 g/dL (ref 30.0–36.0)
MCV: 85.2 fL (ref 80.0–100.0)
Monocytes Absolute: 0.6 10*3/uL (ref 0.1–1.0)
Monocytes Relative: 13 %
NEUTROS ABS: 2.1 10*3/uL (ref 1.7–7.7)
NEUTROS PCT: 51 %
NRBC: 0 % (ref 0.0–0.2)
Platelet Count: 56 10*3/uL — ABNORMAL LOW (ref 150–400)
RBC: 4.79 MIL/uL (ref 4.22–5.81)
RDW: 12.3 % (ref 11.5–15.5)
WBC: 4.2 10*3/uL (ref 4.0–10.5)

## 2018-09-06 ENCOUNTER — Other Ambulatory Visit: Payer: Medicare Other

## 2018-09-06 ENCOUNTER — Ambulatory Visit: Payer: Medicare Other | Admitting: Oncology

## 2018-09-06 ENCOUNTER — Inpatient Hospital Stay: Payer: Medicare Other | Admitting: Oncology

## 2018-09-06 ENCOUNTER — Inpatient Hospital Stay: Payer: Medicare Other

## 2018-09-10 ENCOUNTER — Telehealth: Payer: Self-pay | Admitting: Oncology

## 2018-09-10 NOTE — Telephone Encounter (Signed)
Patient aware of appt per 1/2 sch message-

## 2018-09-12 ENCOUNTER — Telehealth: Payer: Self-pay | Admitting: Endocrinology

## 2018-09-12 NOTE — Telephone Encounter (Signed)
He needs to call [employer] for the fax.  We do not keep Apidra samples but will take Humalog instead

## 2018-09-12 NOTE — Telephone Encounter (Signed)
I have not received any paperwork for this patient from [place].  If we have samples, can he have some?

## 2018-09-12 NOTE — Telephone Encounter (Signed)
Called pt and left detailed voicemail with MD message. 

## 2018-09-12 NOTE — Telephone Encounter (Signed)
Patient receives [employer] patient asstatance and is getting recertified, he stated that this should have been faxed to our office and patient has not received a phone call from our office to fill his part out to received   Patient would like to see if we have a sample of the [place] he has been out of this for awhile.    Insulin Glargine (TOUJEO SOLOSTAR) 300 UNIT/ML SOPN  Insulin Glulisine (APIDRA SOLOSTAR) 100 UNIT/ML Solostar Pen

## 2018-09-14 ENCOUNTER — Ambulatory Visit: Payer: Medicare Other | Admitting: Endocrinology

## 2018-09-19 ENCOUNTER — Other Ambulatory Visit: Payer: Medicare Other

## 2018-09-21 ENCOUNTER — Other Ambulatory Visit (INDEPENDENT_AMBULATORY_CARE_PROVIDER_SITE_OTHER): Payer: Medicare Other

## 2018-09-21 DIAGNOSIS — E1165 Type 2 diabetes mellitus with hyperglycemia: Secondary | ICD-10-CM | POA: Diagnosis not present

## 2018-09-21 DIAGNOSIS — E782 Mixed hyperlipidemia: Secondary | ICD-10-CM | POA: Diagnosis not present

## 2018-09-21 DIAGNOSIS — Z794 Long term (current) use of insulin: Secondary | ICD-10-CM | POA: Diagnosis not present

## 2018-09-21 LAB — LIPID PANEL
Cholesterol: 172 mg/dL (ref 0–200)
HDL: 35.5 mg/dL — ABNORMAL LOW (ref >39.00)
LDL Cholesterol: 118 mg/dL — ABNORMAL HIGH (ref 0–99)
NonHDL: 136.55
Total CHOL/HDL Ratio: 5
Triglycerides: 93 mg/dL (ref 0.0–149.0)
VLDL: 18.6 mg/dL (ref 0.0–40.0)

## 2018-09-21 LAB — COMPREHENSIVE METABOLIC PANEL
ALT: 17 U/L (ref 0–53)
AST: 19 U/L (ref 0–37)
Albumin: 4.6 g/dL (ref 3.5–5.2)
Alkaline Phosphatase: 67 U/L (ref 39–117)
BILIRUBIN TOTAL: 0.5 mg/dL (ref 0.2–1.2)
BUN: 11 mg/dL (ref 6–23)
CO2: 24 mEq/L (ref 19–32)
Calcium: 9.6 mg/dL (ref 8.4–10.5)
Chloride: 104 mEq/L (ref 96–112)
Creatinine, Ser: 1.3 mg/dL (ref 0.40–1.50)
GFR: 69.64 mL/min (ref >60.00)
Glucose, Bld: 116 mg/dL — ABNORMAL HIGH (ref 70–99)
Potassium: 4.5 mEq/L (ref 3.5–5.1)
Sodium: 135 mEq/L (ref 135–145)
TOTAL PROTEIN: 6.9 g/dL (ref 6.0–8.3)

## 2018-09-21 LAB — MICROALBUMIN / CREATININE URINE RATIO
Creatinine,U: 47 mg/dL
Microalb Creat Ratio: 1.5 mg/g (ref 0.0–30.0)
Microalb, Ur: <0.7 mg/dL (ref 0.0–1.9)

## 2018-09-21 LAB — HEMOGLOBIN A1C: Hgb A1c MFr Bld: 8.8 % — ABNORMAL HIGH (ref 4.6–6.5)

## 2018-09-23 NOTE — Progress Notes (Signed)
Patient ID: Edward Clarke, male   DOB: February 16, 1966, 53 y.o.   MRN: [id number]   Reason for Appointment: Diabetes follow-up   History of Present Illness    Diagnosis: Type 2 diabetes mellitus, date of diagnosis: 2005.   He has been on basal bolus insulin regimen in the last few years along with metformin Most of the difficulty with control is related to his noncompliance with diet, medications, glucose monitoring and financial issues Frequently will be running out of one or the other medication or insulin  and blood sugars would not be well controlled  Recent history:   INSULIN: Toujeo units 50-60 units once daily,  Apidra insulin 20-25 units before supper currently none;  His A1c was 9.8 and now 8.8  Current management, blood sugar patterns and problems:  He is again having difficulty getting his insulin consistently from his indigent program  He says he has not had Apidra for some time and his blood sugars were higher and he increased his Toujeo by 10 units  However he did not bring his monitor and usually not checking readings after meals lately  Although he thinks he is trying to be a little more active he is not doing much formal exercise  Currently his diet has been variable, eating a lot of frozen foods and occasionally getting higher fat dairy products like cheese or salad dressing  Previously had difficulty getting other diabetes medications including Invokamet because of cost  Currently on metformin taking this regularly  Glucometer: One Touch Verio.  Checking blood sugar about 1-2x a day  Blood Glucose readings not available, previously averaging 180   Treatment of hypoglycemia: Usually coffee creamer or food.  Does have glucose tablets at home    Meals: Usually 2 meals a day ( lunch 12-1 PM, supper 9 pm)   Physical activity: exercise: A little walking    Wt Readings from Last 3 Encounters:  09/24/18 (!) 326 lb 3.2 oz (148 kg)  07/24/18 (!) 323 lb  12.8 oz (146.9 kg)  07/10/18 (!) 324 lb (147 kg)      Lab Results  Component Value Date   HGBA1C 8.8 (H) 09/21/2018   HGBA1C 9.8 (H) 06/11/2018   HGBA1C 7.3 (H) 03/02/2018   Lab Results  Component Value Date   MICROALBUR <0.7 09/21/2018   LDLCALC 118 (H) 09/21/2018   CREATININE 1.30 09/21/2018    Lab Results  Component Value Date   FRUCTOSAMINE 287 (H) 03/02/2018   FRUCTOSAMINE 317 (H) 09/20/2017   FRUCTOSAMINE 337 (H) 01/02/2017      Allergies as of 09/24/2018      Reactions   Benazepril Other (See Comments)   Reaction unknown - may have been headaches      Medication List       Accurate as of September 24, 2018  9:18 AM. Always use your most recent med list.        atorvastatin 20 MG tablet Commonly known as:  LIPITOR Take one tablet daily   glucose blood test strip Commonly known as:  ONETOUCH VERIO USE ONE STRIP TO CHECK GLUCOSE 4 TIMES DAILY Dx code E11.65   ibuprofen 200 MG tablet Commonly known as:  ADVIL,MOTRIN Take 400-800 mg by mouth every 8 (eight) hours as needed for headache. Reported on 02/18/2016   Insulin Glargine 300 UNIT/ML Sopn Commonly known as:  TOUJEO SOLOSTAR Inject 50 Units into the skin daily.   Insulin Glulisine 100 UNIT/ML Solostar Pen Commonly known as:  Lyndal Rainbow  Inject 20 Units into the skin 2 (two) times daily.   Insulin Pen Needle 31G X 5 MM Misc Use to inject insulin once daily DX code E11.65   lisinopril 10 MG tablet Commonly known as:  PRINIVIL,ZESTRIL Take 1 tablet (10 mg total) by mouth daily.   metFORMIN 1000 MG tablet Commonly known as:  GLUCOPHAGE Take 1 tablet (1,000 mg total) by mouth 2 (two) times daily with a meal.       Allergies:  Allergies  Allergen Reactions  . Benazepril Other (See Comments)    Reaction unknown - may have been headaches    Past Medical History:  Diagnosis Date  . Diabetes mellitus   . Hyperlipidemia   . Hypertension   . Monoclonal paraproteinemia   . Obesity      Past Surgical History:  Procedure Laterality Date  . APPENDECTOMY    . EYE SURGERY    . TONSILLECTOMY      Family History  Problem Relation Age of Onset  . Diabetes Sister     Social History:  reports that he has quit smoking. His smoking use included cigarettes. He has never used smokeless tobacco. He reports current alcohol use of about 2.0 standard drinks of alcohol per week. He reports current drug use. Frequency: 7.00 times per week. Drug: Marijuana.  Review of Systems:   HYPERTENSION:  has had long-standing hypertension and blood pressure is being treated with lisinopril He was previously on Lotrel which he cannot afford and he thinks he is taking the 10 mg lisinopril regularly now   BP Readings from Last 3 Encounters:  09/24/18 136/88  07/24/18 138/81  07/10/18 (!) 142/86    He has had ITP treated by his hematologist, this is improving and is due for follow-up in about 2 weeks   HYPERLIPIDEMIA:  The lipid abnormality consists of elevated LDL treated with Lipitor  LDL is relatively higher.  He may occasionally have higher fat dairy products He is now taking 20 mg dose but has been out of it for a week   Lab Results  Component Value Date   CHOL 172 09/21/2018   HDL 35.50 (L) 09/21/2018   LDLCALC 118 (H) 09/21/2018   LDLDIRECT 174.0 05/27/2016   TRIG 93.0 09/21/2018   CHOLHDL 5 09/21/2018   Renal dysfunction: His creatinine is improving   Lab Results  Component Value Date   CREATININE 1.30 09/21/2018   CREATININE 1.44 06/11/2018   CREATININE 1.46 (H) 05/22/2018    He does not get his eyes checked because of cost of the exam and again discussed need for doing this His last visit was with only a [facility] optician    Examination:   BP 136/88 (BP Location: Left Arm, Patient Position: Sitting, Cuff Size: Large)   Pulse 90   Ht 6\' 2"  (1.88 m)   Wt (!) 326 lb 3.2 oz (148 kg)   SpO2 97%   BMI 41.88 kg/m   Body mass index is 41.88 kg/m.   Repeat  blood pressure with large cuff was 132/86 No ankle edema  Assesment/Plan:   1. Diabetes type 2,  with morbid obesity  See history of present illness for current blood sugar patterns, management  and problems identified  Blood sugars are overall poorly controlled although fasting readings are relatively good with his taking somewhat higher dose of Toujeo As before needs to be on basal bolus insulin taken as much as 25 units of mealtime insulin she does not have at this time  RECOMMENDATIONS: He will start taking Humalog with a sample given Also needs to have some regular insulin from [place] on hand if he runs out of his supply of Apidra from the company He will take at least 15 and to 25 units based on his meal size at all meals Once he is on mealtime insulin he can cut back his Toujeo back to 50 as long as his fasting readings are fairly good Reminded him to check readings after meals also and bring his monitor on next visit for download He needs to be doing a walking program rather than just getting up and moving.  Discussed need for weight loss He will also cut back on high fat dairy products and salad dressings   2. HYPERTENSION: Blood pressure is not controlled  We will increase lisinopril, take 20 mg and call for new prescription when he finishes his current regimen He needs to also look at the sodium content on his frozen dinners  Advised him to take this regularly  3. Hyperlipidemia: He needs to be taking his Lipitor regularly and not run out Discussed LDL target of under 100 which he has not achieved We will continue to monitor  Eye exams: He will be referred for an exam since he does not make an appointment on his own Discussed importance of eye exams with his diabetes and previous retinal problems  Counseling time on subjects discussed in assessment and plan sections is over 50% of today's 25 minute visit   There are no Patient Instructions on file for this visit.           Elayne Snare 09/24/2018, 9:18 AM   Note: This office note was prepared with Dragon voice recognition system technology. Any transcriptional errors that result from this process are unintentional.

## 2018-09-24 ENCOUNTER — Encounter: Payer: Self-pay | Admitting: Endocrinology

## 2018-09-24 ENCOUNTER — Other Ambulatory Visit: Payer: Self-pay

## 2018-09-24 ENCOUNTER — Ambulatory Visit (INDEPENDENT_AMBULATORY_CARE_PROVIDER_SITE_OTHER): Payer: Medicare Other | Admitting: Endocrinology

## 2018-09-24 VITALS — BP 136/88 | HR 90 | Ht 74.0 in | Wt 326.2 lb

## 2018-09-24 DIAGNOSIS — Z794 Long term (current) use of insulin: Secondary | ICD-10-CM | POA: Diagnosis not present

## 2018-09-24 DIAGNOSIS — E1165 Type 2 diabetes mellitus with hyperglycemia: Secondary | ICD-10-CM

## 2018-09-24 DIAGNOSIS — E782 Mixed hyperlipidemia: Secondary | ICD-10-CM | POA: Diagnosis not present

## 2018-09-24 DIAGNOSIS — I10 Essential (primary) hypertension: Secondary | ICD-10-CM

## 2018-09-24 LAB — GLUCOSE, POCT (MANUAL RESULT ENTRY): POC Glucose: 141 mg/dl — AB (ref 70–99)

## 2018-09-24 MED ORDER — INSULIN LISPRO (1 UNIT DIAL) 100 UNIT/ML (KWIKPEN): 20.0000 [IU] | PEN_INJECTOR | Freq: Two times a day (BID) | SUBCUTANEOUS | 11 refills | Status: DC

## 2018-09-24 MED ORDER — INSULIN REGULAR HUMAN 100 UNIT/ML IJ SOLN: 20.0000 [IU] | Freq: Two times a day (BID) | INTRAMUSCULAR | 3 refills | Status: AC

## 2018-09-24 NOTE — Patient Instructions (Addendum)
Sodium under 500 per meal  Check blood sugars on waking up 7 days a week and before meals  Also check blood sugars about 2 hours after meals and do this after different meals by rotation  Recommended blood sugar levels on waking up are 90-130 and about 2 hours after meal is 130-180  Please bring your blood sugar monitor to each visit, thank you  Take 2 Lisinopril daily

## 2018-10-10 ENCOUNTER — Telehealth: Payer: Self-pay

## 2018-10-10 ENCOUNTER — Inpatient Hospital Stay (HOSPITAL_BASED_OUTPATIENT_CLINIC_OR_DEPARTMENT_OTHER): Payer: Medicare Other | Admitting: Oncology

## 2018-10-10 ENCOUNTER — Inpatient Hospital Stay: Payer: Medicare Other | Attending: Oncology

## 2018-10-10 VITALS — BP 139/87 | HR 74 | Temp 98.2°F | Resp 17 | Ht 74.0 in | Wt 326.3 lb

## 2018-10-10 DIAGNOSIS — C8599 Non-Hodgkin lymphoma, unspecified, extranodal and solid organ sites: Secondary | ICD-10-CM

## 2018-10-10 DIAGNOSIS — Z794 Long term (current) use of insulin: Secondary | ICD-10-CM | POA: Diagnosis not present

## 2018-10-10 DIAGNOSIS — Z79899 Other long term (current) drug therapy: Secondary | ICD-10-CM

## 2018-10-10 DIAGNOSIS — C880 Waldenstrom macroglobulinemia: Secondary | ICD-10-CM

## 2018-10-10 DIAGNOSIS — E119 Type 2 diabetes mellitus without complications: Secondary | ICD-10-CM

## 2018-10-10 DIAGNOSIS — Z9221 Personal history of antineoplastic chemotherapy: Secondary | ICD-10-CM | POA: Diagnosis not present

## 2018-10-10 DIAGNOSIS — D649 Anemia, unspecified: Secondary | ICD-10-CM | POA: Diagnosis not present

## 2018-10-10 DIAGNOSIS — D693 Immune thrombocytopenic purpura: Secondary | ICD-10-CM | POA: Insufficient documentation

## 2018-10-10 LAB — CBC WITH DIFFERENTIAL (CANCER CENTER ONLY)
Abs Immature Granulocytes: 0.02 10*3/uL (ref 0.00–0.07)
Basophils Absolute: 0.1 10*3/uL (ref 0.0–0.1)
Basophils Relative: 2 %
Eosinophils Absolute: 0.3 10*3/uL (ref 0.0–0.5)
Eosinophils Relative: 7 %
HCT: 40.9 % (ref 39.0–52.0)
Hemoglobin: 14 g/dL (ref 13.0–17.0)
Immature Granulocytes: 1 %
Lymphocytes Relative: 29 %
Lymphs Abs: 1.1 10*3/uL (ref 0.7–4.0)
MCH: 29.2 pg (ref 26.0–34.0)
MCHC: 34.2 g/dL (ref 30.0–36.0)
MCV: 85.2 fL (ref 80.0–100.0)
Monocytes Absolute: 0.4 10*3/uL (ref 0.1–1.0)
Monocytes Relative: 11 %
Neutro Abs: 2 10*3/uL (ref 1.7–7.7)
Neutrophils Relative %: 50 %
Platelet Count: 48 10*3/uL — ABNORMAL LOW (ref 150–400)
RBC: 4.8 MIL/uL (ref 4.22–5.81)
RDW: 12.7 % (ref 11.5–15.5)
WBC Count: 3.9 10*3/uL — ABNORMAL LOW (ref 4.0–10.5)
nRBC: 0 % (ref 0.0–0.2)

## 2018-10-10 NOTE — Progress Notes (Signed)
Hematology and Oncology Follow Up Visit  Edward Clarke [id number] Sep 12, 1965 53 y.o. 10/10/2018 3:08 PM Patient, No Pcp PerNo ref. provider found   Principle Diagnosis: 53 year old man with:  1.  Lymphoproliferative disorder in the form of lymphoplasmacytic lymphoma (Waldenstrm's macroglobulinemia) presented with elevated IgM and anemia.  This was diagnosed in 2014 and remains in remission at this time.  2.  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    Previous therapy:   Chemotherapy with Bendamustine and Rituxan on 02/14/13. He is S/P cycle 6 on 07/04/2013 with CR.   Status post IVIG completed on 10/20/2017.  Prednisone 80 mg daily started on September 26, 2017.  He is completely tapered off in April 2019.  Rituximab weekly for 4 weeks started on December 12, 2017.  Current therapy:   Active surveillance.   Interim History:  Mr. Thorstenson presents today for a routine evaluation.  Since the last visit, he reports no major changes in his health.  He has been taking steps to eat healthy and drink less alcohol.  He denies any bruising or bleeding complications.  Denies any changes in his performance status or activity level.  He denies any changes in his performance status without any bleeding tendencies or clotting issues.  Patient denied any alteration mental status, neuropathy, confusion or dizziness.  Denies any headaches or lethargy.  Denies any night sweats, weight loss or changes in appetite.  Denied orthopnea, dyspnea on exertion or chest discomfort.  Denies shortness of breath, difficulty breathing hemoptysis or cough.  Denies any abdominal distention, nausea, early satiety or dyspepsia.  Denies any hematuria, frequency, dysuria or nocturia.  Denies any skin irritation, dryness or rash.  Denies any ecchymosis or petechiae.  Denies any lymphadenopathy or clotting.  Denies any heat or cold intolerance.  Denies any anxiety or depression.  Remaining review of system is negative.        .  Medications: I have reviewed the patient's current medications.  Current Outpatient Medications  Medication Sig Dispense Refill  . atorvastatin (LIPITOR) 20 MG tablet Take one tablet daily 90 tablet 3  . glucose blood (ONETOUCH VERIO) test strip USE ONE STRIP TO CHECK GLUCOSE 4 TIMES DAILY Dx code E11.65 400 each 0  . ibuprofen (ADVIL,MOTRIN) 200 MG tablet Take 400-800 mg by mouth every 8 (eight) hours as needed for headache. Reported on 02/18/2016    . Insulin Glargine (TOUJEO SOLOSTAR) 300 UNIT/ML SOPN Inject 50 Units into the skin daily. 5 pen 2  . Insulin Glulisine (APIDRA SOLOSTAR) 100 UNIT/ML Solostar Pen Inject 20 Units into the skin 2 (two) times daily. 5 pen 2  . insulin lispro (HUMALOG KWIKPEN) 100 UNIT/ML KwikPen Inject 0.2 mLs (20 Units total) into the skin 2 (two) times daily. INJECT 20 UNITS UNDER THE SKIN 2 TIMES DAILY. 5 pen 11  . Insulin Pen Needle 31G X 5 MM MISC Use to inject insulin once daily DX code E11.65 90 each 3  . insulin regular (NOVOLIN R RELION) 100 units/mL injection Inject 0.2 mLs (20 Units total) into the skin 2 (two) times daily before a meal. INJECT 20 UNITS UNDER THE SKIN TWICE DAILY. 10 mL 3  . lisinopril (PRINIVIL,ZESTRIL) 10 MG tablet Take 1 tablet (10 mg total) by mouth daily. 90 tablet 3  . metFORMIN (GLUCOPHAGE) 1000 MG tablet Take 1 tablet (1,000 mg total) by mouth 2 (two) times daily with a meal. 60 tablet 3   No current facility-administered medications for this visit.      Allergies:  Allergies  Allergen Reactions  . Benazepril Other (See Comments)    Reaction unknown - may have been headaches    Past Medical History, Surgical history, Social history, and Family History reviewed today.    Physical Exam:  Blood pressure 139/87, pulse 74, temperature 98.2 F (36.8 C), temperature source Oral, resp. rate 17, height 6\' 2"  (1.88 m), weight (!) 326 lb 4.8 oz (148 kg), SpO2 99 %.      ECOG: 1   General appearance: Comfortable  appearing without any discomfort Head: Normocephalic without any trauma Oropharynx: Mucous membranes are moist and pink without any thrush or ulcers. Eyes: Pupils are equal and round reactive to light. Lymph nodes: No cervical, supraclavicular, inguinal or axillary lymphadenopathy.   Heart:regular rate and rhythm.  S1 and S2 without leg edema. Lung: Clear without any rhonchi or wheezes.  No dullness to percussion. Abdomin: Soft, nontender, nondistended with good bowel sounds.  No hepatosplenomegaly. Musculoskeletal: No joint deformity or effusion.  Full range of motion noted. Neurological: No deficits noted on motor, sensory and deep tendon reflex exam. Skin: No petechial rash or dryness.  Appeared moist.  Psychiatric: Mood and affect appeared appropriate.         Lab Results: Lab Results  Component Value Date   WBC 4.2 08/21/2018   HGB 13.8 08/21/2018   HCT 40.8 08/21/2018   MCV 85.2 08/21/2018   PLT 56 (L) 08/21/2018     Chemistry      Component Value Date/Time   NA 135 09/21/2018 1052   NA 139 01/14/2015 1003   K 4.5 09/21/2018 1052   K 4.3 01/14/2015 1003   CL 104 09/21/2018 1052   CL 101 02/14/2013 0758   CO2 24 09/21/2018 1052   CO2 27 01/14/2015 1003   BUN 11 09/21/2018 1052   BUN 8.1 01/14/2015 1003   CREATININE 1.30 09/21/2018 1052   CREATININE 1.46 (H) 05/22/2018 0815   CREATININE 1.2 01/14/2015 1003      Component Value Date/Time   CALCIUM 9.6 09/21/2018 1052   CALCIUM 9.0 01/14/2015 1003   ALKPHOS 67 09/21/2018 1052   ALKPHOS 80 01/14/2015 1003   AST 19 09/21/2018 1052   AST 19 05/22/2018 0815   AST 19 01/14/2015 1003   ALT 17 09/21/2018 1052   ALT 15 05/22/2018 0815   ALT 21 01/14/2015 1003   BILITOT 0.5 09/21/2018 1052   BILITOT 0.4 05/22/2018 0815   BILITOT 0.44 01/14/2015 1003        Impression and Plan:  53 year old man with:  1. ITP resented with platelet count of 5000 in January 2019.   Status post therapy as outlined above and  currently on active surveillance.  Consideration for splenectomy or Nplate would be in the future.  He remains on observation and surveillance at this time with periodic monitoring of his platelet count.  His platelet count remains stable around 48 without any active bleeding.  No further intervention is needed at this time.  2.  Lymphoplasmacytic lymphoma diagnosed in 2014.  He remains in remission at this time.  Repeat protein studies in January 2019 continues to show no evidence of relapsed disease.  These will continue to be monitored periodically and repeated before the next visit.   3.  Diabetes: Trigger managed by his primary care physician without any recent exacerbations or issues.  4.  Follow-up: Monthly for labs and MD follow-up in 3 months.  15  minutes was spent with the patient face-to-face today.  More than  50% of time was dedicated to reviewing his disease status, laboratory data and discussing treatment options.    Zola Button 2/5/20203:08 PM

## 2018-10-10 NOTE — Telephone Encounter (Signed)
Printed avs and calender of upcoming appointment. Per 2/5 los 

## 2018-10-20 ENCOUNTER — Other Ambulatory Visit: Payer: Self-pay | Admitting: Endocrinology

## 2018-10-24 ENCOUNTER — Telehealth: Payer: Self-pay | Admitting: Endocrinology

## 2018-10-24 NOTE — Telephone Encounter (Signed)
Called pt and he stated that he received a call this past week regarding this paperwork from this office, however, there is no documentation supporting this. I will call [employer] pt assistance to find out what else needs to be done for the pt to receive his medications.

## 2018-10-24 NOTE — Telephone Encounter (Signed)
Patient has questions regarding patient assistance paperwork from "[employer]"..needs to bring paperwork today and wants to clarify with you what he needs to bring in support of the application.  Call back number [phone]

## 2018-11-01 ENCOUNTER — Telehealth: Payer: Self-pay

## 2018-11-01 NOTE — Telephone Encounter (Signed)
Spoke with [employer] Patient Assistance and inquired about pt's application and they stated that there is not an active application for the [id number] year for this pt. A new application was printed out, and pt was called for a signature. Pt stated that he is out of town and will not be back until Monday, but Monday he will come in and sign the paperwork and between now and then, he will be reaching out to social security for an updated award letter to assist in qualifying for the patient assistance.  Pt was informed that he can have some samples we have in the office, and they are labeled in the fridge with his name on it, and he will pick up Monday. Pt verbalized understanding.

## 2018-11-12 ENCOUNTER — Encounter: Payer: Self-pay | Admitting: Oncology

## 2018-11-14 ENCOUNTER — Inpatient Hospital Stay: Payer: Medicare Other

## 2018-11-14 ENCOUNTER — Inpatient Hospital Stay: Payer: Medicare Other | Attending: Oncology

## 2018-11-14 ENCOUNTER — Other Ambulatory Visit: Payer: Self-pay

## 2018-11-14 DIAGNOSIS — D693 Immune thrombocytopenic purpura: Secondary | ICD-10-CM

## 2018-11-14 DIAGNOSIS — C880 Waldenstrom macroglobulinemia: Secondary | ICD-10-CM | POA: Insufficient documentation

## 2018-11-14 DIAGNOSIS — C8599 Non-Hodgkin lymphoma, unspecified, extranodal and solid organ sites: Secondary | ICD-10-CM

## 2018-11-14 LAB — CBC WITH DIFFERENTIAL (CANCER CENTER ONLY)
Abs Immature Granulocytes: 0.01 10*3/uL (ref 0.00–0.07)
Basophils Absolute: 0.1 10*3/uL (ref 0.0–0.1)
Basophils Relative: 1 %
Eosinophils Absolute: 0.3 10*3/uL (ref 0.0–0.5)
Eosinophils Relative: 8 %
HCT: 39.7 % (ref 39.0–52.0)
Hemoglobin: 13.2 g/dL (ref 13.0–17.0)
IMMATURE GRANULOCYTES: 0 %
Lymphocytes Relative: 28 %
Lymphs Abs: 1.1 10*3/uL (ref 0.7–4.0)
MCH: 28.8 pg (ref 26.0–34.0)
MCHC: 33.2 g/dL (ref 30.0–36.0)
MCV: 86.5 fL (ref 80.0–100.0)
Monocytes Absolute: 0.4 10*3/uL (ref 0.1–1.0)
Monocytes Relative: 9 %
Neutro Abs: 2 10*3/uL (ref 1.7–7.7)
Neutrophils Relative %: 54 %
Platelet Count: 40 10*3/uL — ABNORMAL LOW (ref 150–400)
RBC: 4.59 MIL/uL (ref 4.22–5.81)
RDW: 12.4 % (ref 11.5–15.5)
WBC Count: 3.8 10*3/uL — ABNORMAL LOW (ref 4.0–10.5)
nRBC: 0 % (ref 0.0–0.2)

## 2018-11-14 LAB — CMP (CANCER CENTER ONLY)
ALT: 13 U/L (ref 0–44)
AST: 16 U/L (ref 15–41)
Albumin: 4 g/dL (ref 3.5–5.0)
Alkaline Phosphatase: 75 U/L (ref 38–126)
Anion gap: 7 (ref 5–15)
BUN: 14 mg/dL (ref 6–20)
CALCIUM: 9.4 mg/dL (ref 8.9–10.3)
CO2: 26 mmol/L (ref 22–32)
Chloride: 103 mmol/L (ref 98–111)
Creatinine: 1.51 mg/dL — ABNORMAL HIGH (ref 0.61–1.24)
GFR, EST NON AFRICAN AMERICAN: 52 mL/min — AB (ref >60)
GFR, Est AFR Am: 60 mL/min — ABNORMAL LOW (ref >60)
Glucose, Bld: 187 mg/dL — ABNORMAL HIGH (ref 70–99)
Potassium: 5.1 mmol/L (ref 3.5–5.1)
Sodium: 136 mmol/L (ref 135–145)
Total Bilirubin: 0.4 mg/dL (ref 0.3–1.2)
Total Protein: 6.4 g/dL — ABNORMAL LOW (ref 6.5–8.1)

## 2018-11-15 LAB — MULTIPLE MYELOMA PANEL, SERUM
Albumin SerPl Elph-Mcnc: 3.7 g/dL (ref 2.9–4.4)
Albumin/Glob SerPl: 2 — ABNORMAL HIGH (ref 0.7–1.7)
Alpha 1: 0.2 g/dL (ref 0.0–0.4)
Alpha2 Glob SerPl Elph-Mcnc: 0.6 g/dL (ref 0.4–1.0)
B-Globulin SerPl Elph-Mcnc: 0.8 g/dL (ref 0.7–1.3)
Gamma Glob SerPl Elph-Mcnc: 0.4 g/dL (ref 0.4–1.8)
Globulin, Total: 1.9 g/dL — ABNORMAL LOW (ref 2.2–3.9)
IgA: 10 mg/dL — ABNORMAL LOW (ref 90–386)
IgG (Immunoglobin G), Serum: 503 mg/dL — ABNORMAL LOW (ref 700–1600)
IgM (Immunoglobulin M), Srm: 29 mg/dL (ref 20–172)
TOTAL PROTEIN ELP: 5.6 g/dL — AB (ref 6.0–8.5)

## 2018-11-23 ENCOUNTER — Other Ambulatory Visit (INDEPENDENT_AMBULATORY_CARE_PROVIDER_SITE_OTHER): Payer: Medicare Other

## 2018-11-23 ENCOUNTER — Other Ambulatory Visit: Payer: Self-pay

## 2018-11-23 DIAGNOSIS — E1165 Type 2 diabetes mellitus with hyperglycemia: Secondary | ICD-10-CM | POA: Diagnosis not present

## 2018-11-23 DIAGNOSIS — I10 Essential (primary) hypertension: Secondary | ICD-10-CM | POA: Diagnosis not present

## 2018-11-23 DIAGNOSIS — E782 Mixed hyperlipidemia: Secondary | ICD-10-CM | POA: Diagnosis not present

## 2018-11-23 LAB — BASIC METABOLIC PANEL
BUN: 15 mg/dL (ref 6–23)
CO2: 27 mEq/L (ref 19–32)
Calcium: 9.5 mg/dL (ref 8.4–10.5)
Chloride: 101 mEq/L (ref 96–112)
Creatinine, Ser: 1.51 mg/dL — ABNORMAL HIGH (ref 0.40–1.50)
GFR: 58.55 mL/min — ABNORMAL LOW (ref >60.00)
Glucose, Bld: 116 mg/dL — ABNORMAL HIGH (ref 70–99)
Potassium: 4.7 mEq/L (ref 3.5–5.1)
Sodium: 134 mEq/L — ABNORMAL LOW (ref 135–145)

## 2018-11-23 LAB — LIPID PANEL
Cholesterol: 159 mg/dL (ref 0–200)
HDL: 32.6 mg/dL — ABNORMAL LOW (ref >39.00)
LDL Cholesterol: 97 mg/dL (ref 0–99)
NonHDL: 126.49
Total CHOL/HDL Ratio: 5
Triglycerides: 145 mg/dL (ref 0.0–149.0)
VLDL: 29 mg/dL (ref 0.0–40.0)

## 2018-11-24 LAB — FRUCTOSAMINE: Fructosamine: 332 umol/L — ABNORMAL HIGH (ref 0–285)

## 2018-11-27 ENCOUNTER — Other Ambulatory Visit: Payer: Self-pay

## 2018-11-27 ENCOUNTER — Ambulatory Visit (INDEPENDENT_AMBULATORY_CARE_PROVIDER_SITE_OTHER): Payer: Medicare Other | Admitting: Endocrinology

## 2018-11-27 ENCOUNTER — Encounter: Payer: Self-pay | Admitting: Endocrinology

## 2018-11-27 VITALS — BP 130/80 | HR 97 | Temp 97.7°F | Ht 74.0 in | Wt 319.4 lb

## 2018-11-27 DIAGNOSIS — I10 Essential (primary) hypertension: Secondary | ICD-10-CM | POA: Diagnosis not present

## 2018-11-27 DIAGNOSIS — Z794 Long term (current) use of insulin: Secondary | ICD-10-CM | POA: Diagnosis not present

## 2018-11-27 DIAGNOSIS — E1165 Type 2 diabetes mellitus with hyperglycemia: Secondary | ICD-10-CM | POA: Diagnosis not present

## 2018-11-27 DIAGNOSIS — E782 Mixed hyperlipidemia: Secondary | ICD-10-CM

## 2018-11-27 NOTE — Patient Instructions (Addendum)
If not getting Toujeo go and get the Novolin N; that will be twice a day starting with probably 30 units in the morning and 20 at bedtime    Walk daily

## 2018-11-27 NOTE — Progress Notes (Signed)
Patient ID: Edward Clarke, male   DOB: 09-08-1965, 53 y.o.   MRN: [id number]   Reason for Appointment: Diabetes follow-up   History of Present Illness    Diagnosis: Type 2 diabetes mellitus, date of diagnosis: 2005.   He has been on basal bolus insulin regimen in the last few years along with metformin Most of the difficulty with control is related to his noncompliance with diet, medications, glucose monitoring and financial issues Frequently will be running out of one or the other medication or insulin  and blood sugars would not be well controlled  Recent history:   INSULIN: Toujeo units 50-60 units once daily,  Apidra insulin 0 units before supper currently none;  His A1c was last 8.8  Current management, blood sugar patterns and problems:  He is again having difficulty getting his insulin consistently from his recent assistance program  He still not able to get his Apidra  He was told to start taking regular insulin from [facility] to replace this but even though he has this at home he did not start using it and not clear why  Also recently he has had less supply of his Toujeo and has cut the dose down to 50 units instead of 60  With his blood sugars are high throughout the day with only a few good readings  Although he has lost weight this is mostly when he was walking when out of town but not motivated to exercise recently  Overall trying to eat relatively healthier meals but at times will tend to act prepackaged foods like Ramen noodles  Blood sugars are being checked less than once a day recently  Also not checking readings after evening meal usually  Glucometer: One Touch Verio.  Checking blood sugar about 1-2x a day  Blood Glucose readings    PRE-MEAL Fasting Lunch Dinner Bedtime Overall  Glucose range:  159-196   127-252    Mean/median:  186  126  195   183   POST-MEAL PC Breakfast PC Lunch PC Dinner  Glucose range:   ?  Mean/median:         Treatment of hypoglycemia: Usually coffee creamer or food.  Does have glucose tablets at home    Meals: Usually 2 meals a day ( lunch 12-1 PM, supper 9 pm)   Physical activity: exercise: A little walking    Wt Readings from Last 3 Encounters:  11/27/18 (!) 319 lb 6.4 oz (144.9 kg)  10/10/18 (!) 326 lb 4.8 oz (148 kg)  09/24/18 (!) 326 lb 3.2 oz (148 kg)      Lab Results  Component Value Date   HGBA1C 8.8 (H) 09/21/2018   HGBA1C 9.8 (H) 06/11/2018   HGBA1C 7.3 (H) 03/02/2018   Lab Results  Component Value Date   MICROALBUR <0.7 09/21/2018   LDLCALC 97 11/23/2018   CREATININE 1.51 (H) 11/23/2018    Lab Results  Component Value Date   FRUCTOSAMINE 332 (H) 11/23/2018   FRUCTOSAMINE 287 (H) 03/02/2018   FRUCTOSAMINE 317 (H) 09/20/2017      Allergies as of 11/27/2018      Reactions   Benazepril Other (See Comments)   Reaction unknown - may have been headaches      Medication List       Accurate as of November 27, 2018  1:24 PM. Always use your most recent med list.        atorvastatin 20 MG tablet Commonly known as:  LIPITOR Take one tablet  daily   glucose blood test strip Commonly known as:  OneTouch Verio USE ONE STRIP TO CHECK GLUCOSE 4 TIMES DAILY Dx code E11.65   ibuprofen 200 MG tablet Commonly known as:  ADVIL,MOTRIN Take 400-800 mg by mouth every 8 (eight) hours as needed for headache. Reported on 02/18/2016   Insulin Glargine 300 UNIT/ML Sopn Commonly known as:  Toujeo SoloStar Inject 50 Units into the skin daily.   Insulin Glulisine 100 UNIT/ML Solostar Pen Commonly known as:  Apidra SoloStar Inject 20 Units into the skin 2 (two) times daily.   Insulin Pen Needle 31G X 5 MM Misc Use to inject insulin once daily DX code E11.65   insulin regular 100 units/mL injection Commonly known as:  NovoLIN R ReliOn Inject 0.2 mLs (20 Units total) into the skin 2 (two) times daily before a meal. INJECT 20 UNITS UNDER THE SKIN TWICE DAILY.   lisinopril 10  MG tablet Commonly known as:  PRINIVIL,ZESTRIL Take 1 tablet (10 mg total) by mouth daily.   metFORMIN 1000 MG tablet Commonly known as:  GLUCOPHAGE TAKE 1 TABLET BY MOUTH TWICE DAILY WITH FOOD       Allergies:  Allergies  Allergen Reactions  . Benazepril Other (See Comments)    Reaction unknown - may have been headaches    Past Medical History:  Diagnosis Date  . Diabetes mellitus   . Hyperlipidemia   . Hypertension   . Monoclonal paraproteinemia   . Obesity     Past Surgical History:  Procedure Laterality Date  . APPENDECTOMY    . EYE SURGERY    . TONSILLECTOMY      Family History  Problem Relation Age of Onset  . Diabetes Sister     Social History:  reports that he has quit smoking. His smoking use included cigarettes. He has never used smokeless tobacco. He reports current alcohol use of about 2.0 standard drinks of alcohol per week. He reports current drug use. Frequency: 7.00 times per week. Drug: Marijuana.  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, recently platelet count 40,000   HYPERLIPIDEMIA:  The lipid abnormality consists of elevated LDL treated with Lipitor  LDL is relatively better with his recent compliance with medication and somewhat better diet  He is now taking 20 mg dose    Lab Results  Component Value Date   CHOL 159 11/23/2018   HDL 32.60 (L) 11/23/2018   LDLCALC 97 11/23/2018   LDLDIRECT 174.0 05/27/2016   TRIG 145.0 11/23/2018   CHOLHDL 5 11/23/2018   Renal dysfunction: His creatinine is high but creatinine clearance only 58   Lab Results  Component Value Date   CREATININE 1.51 (H) 11/23/2018    CREATININE 1.51 (H) 11/14/2018   CREATININE 1.30 09/21/2018    He is still waiting for scheduling for the eye exam    Examination:   BP 130/80 (BP Location: Left Arm, Patient Position: Sitting, Cuff Size: Large)   Pulse 97   Temp 97.7 F (36.5 C) (Oral)   Ht 6\' 2"  (1.88 m)   Wt (!) 319 lb 6.4 oz (144.9 kg)   SpO2 99%   BMI 41.01 kg/m  Body mass index is 41.01 kg/m.     Assesment/Plan:   1. Diabetes type 2,  with morbid obesity  See history of present illness for current blood sugar patterns, management  and problems identified  Last A1c 8.8  As before he is still inconsistent with his insulin He does not understand recently the need to take both basal and bolus insulins  RECOMMENDATIONS: He will start taking regular insulin at home and likely needs about the same as Apidra, usually 15 units in the morning and 20--25 units with his evening meal If he is not able to get his Toujeo he will use regular insulin along with NPH twice a day Discussed differences between NPH and Toujeo and he will call if he has any inconsistent blood sugar results with this Discussed need for a consistent basal insulin to get fasting readings controlled and postprandial readings to be controlled with mealtime insulin For now since his creatinine clearance is nearly 60 he can continue the same dose of metformin He does need to start walking for exercise and further weight loss  2. HYPERTENSION: Blood pressure is better controlled with lower sodium intake     3. Hyperlipidemia: Better controlled with improved compliance with Lipitor and encouraged him to continue improving diet  4.  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 Any transcriptional errors that result from this process are unintentional.

## 2018-12-12 ENCOUNTER — Other Ambulatory Visit: Payer: Medicare Other

## 2018-12-13 ENCOUNTER — Other Ambulatory Visit: Payer: Self-pay | Admitting: Endocrinology

## 2019-01-08 ENCOUNTER — Inpatient Hospital Stay: Payer: Medicare Other

## 2019-01-08 ENCOUNTER — Other Ambulatory Visit: Payer: Self-pay

## 2019-01-08 ENCOUNTER — Inpatient Hospital Stay: Payer: Medicare Other | Attending: Oncology | Admitting: Oncology

## 2019-01-08 VITALS — BP 137/80 | HR 76 | Temp 98.3°F | Resp 17 | Ht 74.0 in | Wt 323.8 lb

## 2019-01-08 DIAGNOSIS — Z79899 Other long term (current) drug therapy: Secondary | ICD-10-CM | POA: Diagnosis not present

## 2019-01-08 DIAGNOSIS — Z9221 Personal history of antineoplastic chemotherapy: Secondary | ICD-10-CM | POA: Diagnosis not present

## 2019-01-08 DIAGNOSIS — C880 Waldenstrom macroglobulinemia: Secondary | ICD-10-CM | POA: Insufficient documentation

## 2019-01-08 DIAGNOSIS — D649 Anemia, unspecified: Secondary | ICD-10-CM | POA: Diagnosis not present

## 2019-01-08 DIAGNOSIS — Z794 Long term (current) use of insulin: Secondary | ICD-10-CM | POA: Diagnosis not present

## 2019-01-08 DIAGNOSIS — E119 Type 2 diabetes mellitus without complications: Secondary | ICD-10-CM

## 2019-01-08 DIAGNOSIS — D693 Immune thrombocytopenic purpura: Secondary | ICD-10-CM | POA: Diagnosis not present

## 2019-01-08 DIAGNOSIS — C8599 Non-Hodgkin lymphoma, unspecified, extranodal and solid organ sites: Secondary | ICD-10-CM

## 2019-01-08 LAB — CBC WITH DIFFERENTIAL (CANCER CENTER ONLY)
Abs Immature Granulocytes: 0.02 10*3/uL (ref 0.00–0.07)
Basophils Absolute: 0.1 10*3/uL (ref 0.0–0.1)
Basophils Relative: 1 %
Eosinophils Absolute: 0.3 10*3/uL (ref 0.0–0.5)
Eosinophils Relative: 5 %
HCT: 42.6 % (ref 39.0–52.0)
Hemoglobin: 14.4 g/dL (ref 13.0–17.0)
Immature Granulocytes: 0 %
Lymphocytes Relative: 23 %
Lymphs Abs: 1.4 10*3/uL (ref 0.7–4.0)
MCH: 29.1 pg (ref 26.0–34.0)
MCHC: 33.8 g/dL (ref 30.0–36.0)
MCV: 86.1 fL (ref 80.0–100.0)
Monocytes Absolute: 0.5 10*3/uL (ref 0.1–1.0)
Monocytes Relative: 8 %
Neutro Abs: 3.7 10*3/uL (ref 1.7–7.7)
Neutrophils Relative %: 63 %
Platelet Count: 44 10*3/uL — ABNORMAL LOW (ref 150–400)
RBC: 4.95 MIL/uL (ref 4.22–5.81)
RDW: 13.1 % (ref 11.5–15.5)
WBC Count: 5.9 10*3/uL (ref 4.0–10.5)
nRBC: 0 % (ref 0.0–0.2)

## 2019-01-08 NOTE — Progress Notes (Signed)
Hematology and Oncology Follow Up Visit  Edward Clarke [id number] 02/07/1966 53 y.o. 01/08/2019 3:17 PM Patient, No Pcp PerNo ref. provider found   Principle Diagnosis: 53 year old man with:  1.  Waldenstrm's macroglobulinemia diagnosed in 2014.  He presented with elevated IgM and anemia.  And remains in remission at this time.  2.  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    Previous therapy:   Chemotherapy with Bendamustine and Rituxan on 02/14/13. He is S/P cycle 6 on 07/04/2013 with CR.   Status post IVIG completed on 10/20/2017.  Prednisone 80 mg daily started on September 26, 2017.  He is completely tapered off in April 2019.  Rituximab weekly for 4 weeks started on December 12, 2017.  Current therapy:   Active surveillance.   Interim History:  Mr. Batzel is here for a repeat evaluation.  Since the last visit, he reports no major changes in his health.  He continues to do well without any changes in his appetite.  He denies any bruising or bleeding issues.  His performance status and quality of life remains excellent.  He denies any constitutional symptoms or lymphadenopathy.  Patient denied headaches, blurry vision, syncope or seizures.  Denies any fevers, chills or sweats.  Denied chest pain, palpitation, orthopnea or leg edema.  Denied cough, wheezing or hemoptysis.  Denied nausea, vomiting or abdominal pain.  Denies any constipation or diarrhea.  Denies any frequency urgency or hesitancy.  Denies any arthralgias or myalgias.  Denies any skin rashes or lesions.  Denies any bleeding or clotting tendency.  Denies any easy bruising.  Denies any hair or nail changes.  Denies any anxiety or depression.  Remaining review of system is negative.        .  Medications: I have reviewed the patient's current medications.  Current Outpatient Medications  Medication Sig Dispense Refill  . atorvastatin (LIPITOR) 20 MG tablet Take one tablet daily 90 tablet 3  . glucose blood (ONETOUCH VERIO) test  strip USE ONE STRIP TO CHECK GLUCOSE 4 TIMES DAILY Dx code E11.65 400 each 0  . ibuprofen (ADVIL,MOTRIN) 200 MG tablet Take 400-800 mg by mouth every 8 (eight) hours as needed for headache. Reported on 02/18/2016    . Insulin Glargine (TOUJEO SOLOSTAR) 300 UNIT/ML SOPN Inject 50 Units into the skin daily. 5 pen 2  . Insulin Glulisine (APIDRA SOLOSTAR) 100 UNIT/ML Solostar Pen Inject 20 Units into the skin 2 (two) times daily. (Patient not taking: Reported on 11/27/2018) 5 pen 2  . Insulin Pen Needle 31G X 5 MM MISC Use to inject insulin once daily DX code E11.65 90 each 3  . insulin regular (NOVOLIN R RELION) 100 units/mL injection Inject 0.2 mLs (20 Units total) into the skin 2 (two) times daily before a meal. INJECT 20 UNITS UNDER THE SKIN TWICE DAILY. 10 mL 3  . lisinopril (PRINIVIL,ZESTRIL) 10 MG tablet Take 1 tablet (10 mg total) by mouth daily. 90 tablet 3  . metFORMIN (GLUCOPHAGE) 1000 MG tablet Take 1 tablet by mouth twice daily with food 60 tablet 0   No current facility-administered medications for this visit.      Allergies:  Allergies  Allergen Reactions  . Benazepril Other (See Comments)    Reaction unknown - may have been headaches    Past Medical History, Surgical history, Social history, and Family History reviewed today.    Physical Exam:    Blood pressure 137/80, pulse 76, temperature 98.3 F (36.8 C), temperature source Oral, resp. rate 17, height  6\' 2"  (1.88 m), weight (!) 323 lb 12.8 oz (146.9 kg), SpO2 100 %.     ECOG: 1   General appearance: Alert, awake without any distress. Head: Atraumatic without abnormalities Oropharynx: Without any thrush or ulcers. Eyes: No scleral icterus. Lymph nodes: No lymphadenopathy noted in the cervical, supraclavicular, or axillary nodes Heart:regular rate and rhythm, without any murmurs or gallops.   Lung: Clear to auscultation without any rhonchi, wheezes or dullness to percussion. Abdomin: Soft, nontender without any  shifting dullness or ascites. Musculoskeletal: No clubbing or cyanosis. Neurological: No motor or sensory deficits. Skin: No rashes or lesions. Psychiatric: Mood and affect appeared normal.          Lab Results: Lab Results  Component Value Date   WBC 3.8 (L) 11/14/2018   HGB 13.2 11/14/2018   HCT 39.7 11/14/2018   MCV 86.5 11/14/2018   PLT 40 (L) 11/14/2018     Chemistry      Component Value Date/Time   NA 134 (L) 11/23/2018 1554   NA 139 01/14/2015 1003   K 4.7 11/23/2018 1554   K 4.3 01/14/2015 1003   CL 101 11/23/2018 1554   CL 101 02/14/2013 0758   CO2 27 11/23/2018 1554   CO2 27 01/14/2015 1003   BUN 15 11/23/2018 1554   BUN 8.1 01/14/2015 1003   CREATININE 1.51 (H) 11/23/2018 1554   CREATININE 1.51 (H) 11/14/2018 1555   CREATININE 1.2 01/14/2015 1003      Component Value Date/Time   CALCIUM 9.5 11/23/2018 1554   CALCIUM 9.0 01/14/2015 1003   ALKPHOS 75 11/14/2018 1555   ALKPHOS 80 01/14/2015 1003   AST 16 11/14/2018 1555   AST 19 01/14/2015 1003   ALT 13 11/14/2018 1555   ALT 21 01/14/2015 1003   BILITOT 0.4 11/14/2018 1555   BILITOT 0.44 01/14/2015 1003        Impression and Plan:  53 year old man with:  1. 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   He remains on active surveillance without any evidence to suggest recurrent disease.  The natural course of this disease and risk of relapse was assessed and I recommended continued active surveillance at this time with consideration for splenectomy if needed in the future.  His platelet count remains stable at this time without any changes.  No active treatment is needed.  2. Waldenstrm's macroglobulinemia diagnosed in 2014.  Protein studies obtained in March 2020 continues to show no evidence of relapsed disease at this time.  These will continue to be monitored periodically.   3.  Diabetes: No recent exacerbations noted at this time.  4.  Follow-up: In July 2020  for repeat evaluation.  15  minutes was spent with the patient face-to-face today.  More than 50% of time was dedicated to reviewing his disease status, laboratory data and discussing treatment options.    Zola Button 5/5/20203:17 PM

## 2019-01-21 ENCOUNTER — Other Ambulatory Visit: Payer: Self-pay | Admitting: Endocrinology

## 2019-01-21 DIAGNOSIS — E113391 Type 2 diabetes mellitus with moderate nonproliferative diabetic retinopathy without macular edema, right eye: Secondary | ICD-10-CM | POA: Diagnosis not present

## 2019-01-21 LAB — HM DIABETES EYE EXAM

## 2019-01-29 ENCOUNTER — Other Ambulatory Visit (INDEPENDENT_AMBULATORY_CARE_PROVIDER_SITE_OTHER): Payer: Medicare Other

## 2019-01-29 ENCOUNTER — Other Ambulatory Visit: Payer: Self-pay

## 2019-01-29 DIAGNOSIS — Z794 Long term (current) use of insulin: Secondary | ICD-10-CM | POA: Diagnosis not present

## 2019-01-29 DIAGNOSIS — E1165 Type 2 diabetes mellitus with hyperglycemia: Secondary | ICD-10-CM

## 2019-01-29 LAB — COMPREHENSIVE METABOLIC PANEL
ALT: 16 U/L (ref 0–53)
AST: 32 U/L (ref 0–37)
Albumin: 4.9 g/dL (ref 3.5–5.2)
Alkaline Phosphatase: 76 U/L (ref 39–117)
BUN: 16 mg/dL (ref 6–23)
CO2: 22 mEq/L (ref 19–32)
Calcium: 9.8 mg/dL (ref 8.4–10.5)
Chloride: 97 mEq/L (ref 96–112)
Creatinine, Ser: 1.76 mg/dL — ABNORMAL HIGH (ref 0.40–1.50)
GFR: 49.03 mL/min — ABNORMAL LOW (ref >60.00)
Glucose, Bld: 216 mg/dL — ABNORMAL HIGH (ref 70–99)
Potassium: 4.4 mEq/L (ref 3.5–5.1)
Sodium: 132 mEq/L — ABNORMAL LOW (ref 135–145)
Total Bilirubin: 1.1 mg/dL (ref 0.2–1.2)
Total Protein: 7.3 g/dL (ref 6.0–8.3)

## 2019-01-29 LAB — HEMOGLOBIN A1C: Hgb A1c MFr Bld: 8.5 % — ABNORMAL HIGH (ref 4.6–6.5)

## 2019-01-30 ENCOUNTER — Other Ambulatory Visit: Payer: Self-pay

## 2019-01-30 ENCOUNTER — Telehealth: Payer: Self-pay | Admitting: Endocrinology

## 2019-01-30 ENCOUNTER — Encounter: Payer: Self-pay | Admitting: Oncology

## 2019-01-30 NOTE — Telephone Encounter (Signed)
Calling Regarding [employer] and is requesting a call back at your convenience [phone]

## 2019-01-30 NOTE — Telephone Encounter (Signed)
Attempted to call pt and received no answer. Left voicemail requesting a call back.

## 2019-01-30 NOTE — Telephone Encounter (Signed)
Called pt and addressed concerns he had about his [employer] pt assistance application. I explained to patient that he failed to completely fill out the form and he stated that he would come to the office Monday and complete this so it can be sent to [employer] Pt assistance.

## 2019-01-31 ENCOUNTER — Ambulatory Visit (INDEPENDENT_AMBULATORY_CARE_PROVIDER_SITE_OTHER): Payer: Medicare Other | Admitting: Endocrinology

## 2019-01-31 ENCOUNTER — Encounter: Payer: Self-pay | Admitting: Endocrinology

## 2019-01-31 ENCOUNTER — Other Ambulatory Visit: Payer: Self-pay

## 2019-01-31 DIAGNOSIS — N289 Disorder of kidney and ureter, unspecified: Secondary | ICD-10-CM

## 2019-01-31 DIAGNOSIS — E1165 Type 2 diabetes mellitus with hyperglycemia: Secondary | ICD-10-CM | POA: Diagnosis not present

## 2019-01-31 DIAGNOSIS — Z794 Long term (current) use of insulin: Secondary | ICD-10-CM

## 2019-01-31 DIAGNOSIS — I10 Essential (primary) hypertension: Secondary | ICD-10-CM

## 2019-01-31 NOTE — Progress Notes (Signed)
Patient ID: Edward Clarke, male   DOB: Mar 27, 1966, 53 y.o.   MRN: [id number]   Today's office visit was provided via telemedicine using video technique Explained to the patient and the the limitations of evaluation and management by telemedicine and the availability of in person appointments.  The patient understood the limitations and agreed to proceed. Patient also understood that the telehealth visit is billable.  Location of the patient: Home  Location of the provider: Office Only the patient and myself were participating in the encounter    Reason for Appointment: Diabetes follow-up   History of Present Illness    Diagnosis: Type 2 diabetes mellitus, date of diagnosis: 2005.   He has been on basal bolus insulin regimen in the last few years along with metformin Most of the difficulty with control is related to his noncompliance with diet, medications, glucose monitoring and financial issues Frequently will be running out of one or the other medication or insulin  and blood sugars would not be well controlled  Recent history:   INSULIN: Currently Novolin NPH insulin 30 units twice daily  Previous regimen Toujeo units 50-60 units once daily,  Apidra insulin 0 units before supper currently none;  His A1c was last 8.8 and now 8.5  Current management, blood sugar patterns and problems:  He is still not getting Toujeo and Apidra from the patient assistance program because he did not fill out his forms properly  He also did not understand the need for taking both NPH and regular insulin for his blood sugar control and is only taking Novolin NPH  He thinks that with taking the regular insulin he was tending to get low sugars but he tried taking 20 units and not leaning less  However overall he thinks he is cutting back on his portions about 50% and does not understand the need to adjust his mealtime dose based on his total carbohydrate intake  He has had a lot of  stress and is going to move out of town and he thinks he has lost at least 15 pounds recently  He is not exercising much and may be a little walking at times  Most of his blood sugars are being checked in the mornings and not after meals especially after dinner  However only today his blood sugar was below 100 fasting and otherwise consistently high at all times  Glucometer: One Touch Verio.  Checking blood sugar about 1-2x a day  Blood Glucose readings from patient reviewing his monitor   PRE-MEAL Fasting Lunch Dinner Bedtime Overall  Glucose range:  99-181   164, 254    Mean/median:      163   POST-MEAL PC Breakfast PC Lunch PC Dinner  Glucose range:  215  176   Mean/median:      PREVIOUS readings:  PRE-MEAL Fasting Lunch Dinner Bedtime Overall  Glucose range:  159-196   127-252    Mean/median:  186  126  195   183   POST-MEAL PC Breakfast PC Lunch PC Dinner  Glucose range:   ?  Mean/median:       Meals: Usually 2 meals a day ( lunch 12-1 PM, supper 9 pm)   Physical activity: exercise: A little walking    Wt Readings from Last 3 Encounters:  01/08/19 (!) 323 lb 12.8 oz (146.9 kg)  11/27/18 (!) 319 lb 6.4 oz (144.9 kg)  10/10/18 (!) 326 lb 4.8 oz (148 kg)      Lab Results  Component Value Date   HGBA1C 8.5 (H) 01/29/2019   HGBA1C 8.8 (H) 09/21/2018   HGBA1C 9.8 (H) 06/11/2018   Lab Results  Component Value Date   MICROALBUR <0.7 09/21/2018   LDLCALC 97 11/23/2018   CREATININE 1.76 (H) 01/29/2019    Lab Results  Component Value Date   FRUCTOSAMINE 332 (H) 11/23/2018   FRUCTOSAMINE 287 (H) 03/02/2018   FRUCTOSAMINE 317 (H) 09/20/2017      Allergies as of 01/31/2019      Reactions   Benazepril Other (See Comments)   Reaction unknown - may have been headaches      Medication List       Accurate as of Jan 31, 2019 10:55 AM. If you have any questions, ask your nurse or doctor.        atorvastatin 20 MG tablet Commonly known as:  LIPITOR Take  one tablet daily   glucose blood test strip Commonly known as:  OneTouch Verio USE ONE STRIP TO CHECK GLUCOSE 4 TIMES DAILY Dx code E11.65   ibuprofen 200 MG tablet Commonly known as:  ADVIL Take 400-800 mg by mouth every 8 (eight) hours as needed for headache. Reported on 02/18/2016   Insulin Glulisine 100 UNIT/ML Solostar Pen Commonly known as:  Apidra SoloStar Inject 20 Units into the skin 2 (two) times daily.   insulin NPH Human 100 UNIT/ML injection Commonly known as:  NOVOLIN N Inject 30 Units into the skin 2 (two) times a day. Inject 30 units under the skin in the morning and 30 units at night.   Insulin Pen Needle 31G X 5 MM Misc Use to inject insulin once daily DX code E11.65   insulin regular 100 units/mL injection Commonly known as:  NovoLIN R ReliOn Inject 0.2 mLs (20 Units total) into the skin 2 (two) times daily before a meal. INJECT 20 UNITS UNDER THE SKIN TWICE DAILY. What changed:  additional instructions   lisinopril 10 MG tablet Commonly known as:  ZESTRIL Take 1 tablet (10 mg total) by mouth daily.   metFORMIN 1000 MG tablet Commonly known as:  GLUCOPHAGE Take 1 tablet by mouth twice daily with food What changed:    how much to take  how to take this  when to take this  additional instructions       Allergies:  Allergies  Allergen Reactions   Benazepril Other (See Comments)    Reaction unknown - may have been headaches    Past Medical History:  Diagnosis Date   Diabetes mellitus    Hyperlipidemia    Hypertension    Monoclonal paraproteinemia    Obesity     Past Surgical History:  Procedure Laterality Date   APPENDECTOMY     EYE SURGERY     TONSILLECTOMY      Family History  Problem Relation Age of Onset   Diabetes Sister     Social History:  reports that he has quit smoking. His smoking use included cigarettes. He has never used smokeless tobacco. He reports current alcohol use of about 2.0 standard drinks of  alcohol per week. He reports current drug use. Frequency: 7.00 times per week. Drug: Marijuana.  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, recently platelet count 40,000   HYPERLIPIDEMIA:  The lipid abnormality consists of elevated LDL treated with Lipitor  LDL is relatively better with his recent compliance with medication and somewhat better diet  He is taking 20 mg dose of atorvastatin   Lab Results  Component Value Date   CHOL 159 11/23/2018   HDL 32.60 (L) 11/23/2018   LDLCALC 97 11/23/2018   LDLDIRECT 174.0 05/27/2016   TRIG 145.0 11/23/2018   CHOLHDL 5 11/23/2018   Renal dysfunction: His creatinine is recently higher, previously had been stable No history of microalbuminuria  Lab Results  Component Value Date   CREATININE 1.76 (H) 01/29/2019   CREATININE 1.51 (H) 11/23/2018   CREATININE 1.51 (H) 11/14/2018        Examination:   There were no vitals taken for this visit.  There is no height or weight on file to calculate BMI.     Assesment/Plan:   1. Diabetes type 2,  with morbid obesity  See history of present illness for current blood sugar patterns, management  and problems identified  He is insulin-dependent  As before his level of control is dependent on his compliance with taking his insulin Also still has poor understanding of the difference between basal and mealtime insulin Currently taking only NPH twice daily and likely has significant postprandial hyperglycemia including overnight most of the time Also not consistent with his metformin twice a day also He thinks he is losing weight but this is likely to be from stress and  reduced food intake but is not exercising enough  Also not checking readings after meals at night  RECOMMENDATIONS: He will start taking regular insulin before his meals He will also take 5 units in the morning when he has coffee since this makes her sugar go up He will titrate his regular insulin based on his 2-hour postprandial readings which should be at least under 180 He will start with 12 units twice daily before his meals preferably 30 minutes before eating No change in NPH as yet but may need to reduce this if morning sugars are low normal  However when he goes back to Toujeo and Apidra which he should be able to get some he will use his previous doses  2. HYPERTENSION: Blood pressure needs to be checked  Previously controlled    3. Hyperlipidemia: Has been controlled with Lipitor and discussed that he needs to be consistent with this for long-term benefits He is generally cutting back on saturated fat but can still do better with high-fat dairy products  4.  RENAL dysfunction: This appears to be slightly worse No clear-cut etiology evident and slow progression may be related to nephrosclerosis, hypertension and diabetes Since he is not taking any nonsteroidal anti-inflammatory drugs do not think there is any reversible cause but discussed that he will need to see a nephrologist if creatinine goes up any further In the meantime encouraged him to increase his fluid intake overall   There are no Patient Instructions on file for this visit.          Elayne Snare 01/31/2019, 10:55 AM   Note: This office note was prepared with Dragon voice recognition system technology. Any transcriptional errors that result from this process are unintentional.

## 2019-02-01 ENCOUNTER — Telehealth: Payer: Self-pay | Admitting: *Deleted

## 2019-02-01 NOTE — Telephone Encounter (Signed)
MR faxed to [facility] - Release: [id number]

## 2019-02-07 ENCOUNTER — Telehealth: Payer: Self-pay

## 2019-02-07 NOTE — Telephone Encounter (Signed)
Received fax from [facility] stating that the patient has been approved to receive Apidra and Toujeo from [facility]. Patient is enrolled in this program until 02/05/2020. The first shipment of medication will arrive at the patients home within 3-5 business days. Pt notified of this via [place].

## 2019-02-12 ENCOUNTER — Telehealth: Payer: Self-pay

## 2019-02-12 NOTE — Telephone Encounter (Signed)
Received shipment of [employer] Patient assistance medications for this pt. In box was 3 boxes of Toujeo U300. Each box contains 3 pens for a total of 9 pens.  Pt notified via MyChart that he can pick up these medications at his earliest convenience.

## 2019-02-14 NOTE — Telephone Encounter (Signed)
Received shipment from [facility]. Inside box was 2 boxes of Apidra. Pt notified via [place].

## 2019-02-21 DIAGNOSIS — C880 Waldenstrom macroglobulinemia: Secondary | ICD-10-CM | POA: Diagnosis not present

## 2019-03-05 DIAGNOSIS — C880 Waldenstrom macroglobulinemia: Secondary | ICD-10-CM | POA: Diagnosis not present

## 2019-03-05 DIAGNOSIS — C8590 Non-Hodgkin lymphoma, unspecified, unspecified site: Secondary | ICD-10-CM | POA: Diagnosis not present

## 2019-03-05 DIAGNOSIS — D693 Immune thrombocytopenic purpura: Secondary | ICD-10-CM | POA: Diagnosis not present

## 2019-03-20 ENCOUNTER — Other Ambulatory Visit: Payer: Self-pay

## 2019-03-20 ENCOUNTER — Telehealth: Payer: Self-pay | Admitting: Endocrinology

## 2019-03-20 MED ORDER — METFORMIN HCL 1000 MG PO TABS: 500.0000 mg | ORAL_TABLET | Freq: Every day | ORAL | 0 refills | Status: AC

## 2019-03-20 NOTE — Telephone Encounter (Signed)
Rx sent in 60 qty with no refills since pt is establishing with new provider elsewhere.

## 2019-03-20 NOTE — Telephone Encounter (Signed)
MEDICATION:   metFORMIN (GLUCOPHAGE) 1000 MG tablet     PHARMACY:  [facility].  Ph # [phone]  IS THIS A 90 DAY SUPPLY :   IS PATIENT OUT OF MEDICATION:   IF NOT; HOW MUCH IS LEFT:   LAST APPOINTMENT DATE: @6 /05/2019  NEXT APPOINTMENT DATE:@Visit  date not found  DO WE HAVE YOUR PERMISSION TO LEAVE A DETAILED MESSAGE:  OTHER COMMENTS:  Patient will see the new doctor next month on the 18th  **Let patient know to contact pharmacy at the end of the day to make sure medication is ready. **  ** Please notify patient to allow 48-72 hours to process**  **Encourage patient to contact the pharmacy for refills or they can request refills through [facility]**

## 2019-04-01 ENCOUNTER — Other Ambulatory Visit: Payer: Medicare Other

## 2019-04-03 ENCOUNTER — Ambulatory Visit: Payer: Medicare Other | Admitting: Endocrinology

## 2019-04-24 DIAGNOSIS — Z794 Long term (current) use of insulin: Secondary | ICD-10-CM | POA: Diagnosis not present

## 2019-04-24 DIAGNOSIS — R809 Proteinuria, unspecified: Secondary | ICD-10-CM | POA: Diagnosis not present

## 2019-04-24 DIAGNOSIS — D539 Nutritional anemia, unspecified: Secondary | ICD-10-CM | POA: Diagnosis not present

## 2019-04-24 DIAGNOSIS — I10 Essential (primary) hypertension: Secondary | ICD-10-CM | POA: Diagnosis not present

## 2019-04-24 DIAGNOSIS — Z125 Encounter for screening for malignant neoplasm of prostate: Secondary | ICD-10-CM | POA: Diagnosis not present

## 2019-04-24 DIAGNOSIS — C7951 Secondary malignant neoplasm of bone: Secondary | ICD-10-CM | POA: Diagnosis not present

## 2019-04-24 DIAGNOSIS — E1129 Type 2 diabetes mellitus with other diabetic kidney complication: Secondary | ICD-10-CM | POA: Diagnosis not present

## 2019-04-24 DIAGNOSIS — E7800 Pure hypercholesterolemia, unspecified: Secondary | ICD-10-CM | POA: Diagnosis not present

## 2019-04-24 DIAGNOSIS — D693 Immune thrombocytopenic purpura: Secondary | ICD-10-CM | POA: Diagnosis not present

## 2019-04-25 DIAGNOSIS — E1129 Type 2 diabetes mellitus with other diabetic kidney complication: Secondary | ICD-10-CM | POA: Diagnosis not present

## 2019-04-25 DIAGNOSIS — I10 Essential (primary) hypertension: Secondary | ICD-10-CM | POA: Diagnosis not present

## 2019-04-25 DIAGNOSIS — C7951 Secondary malignant neoplasm of bone: Secondary | ICD-10-CM | POA: Diagnosis not present

## 2019-04-25 DIAGNOSIS — Z794 Long term (current) use of insulin: Secondary | ICD-10-CM | POA: Diagnosis not present

## 2019-04-25 DIAGNOSIS — D539 Nutritional anemia, unspecified: Secondary | ICD-10-CM | POA: Diagnosis not present

## 2019-04-25 DIAGNOSIS — Z125 Encounter for screening for malignant neoplasm of prostate: Secondary | ICD-10-CM | POA: Diagnosis not present

## 2019-04-25 DIAGNOSIS — R809 Proteinuria, unspecified: Secondary | ICD-10-CM | POA: Diagnosis not present

## 2019-04-25 DIAGNOSIS — E7800 Pure hypercholesterolemia, unspecified: Secondary | ICD-10-CM | POA: Diagnosis not present

## 2019-04-25 DIAGNOSIS — D693 Immune thrombocytopenic purpura: Secondary | ICD-10-CM | POA: Diagnosis not present

## 2019-04-26 DIAGNOSIS — E11319 Type 2 diabetes mellitus with unspecified diabetic retinopathy without macular edema: Secondary | ICD-10-CM | POA: Diagnosis not present

## 2019-05-23 DIAGNOSIS — I4510 Unspecified right bundle-branch block: Secondary | ICD-10-CM | POA: Diagnosis not present

## 2019-05-23 DIAGNOSIS — I10 Essential (primary) hypertension: Secondary | ICD-10-CM | POA: Diagnosis not present

## 2019-05-23 DIAGNOSIS — R9431 Abnormal electrocardiogram [ECG] [EKG]: Secondary | ICD-10-CM | POA: Diagnosis not present

## 2019-05-23 DIAGNOSIS — Z0000 Encounter for general adult medical examination without abnormal findings: Secondary | ICD-10-CM | POA: Diagnosis not present

## 2019-05-23 DIAGNOSIS — R0600 Dyspnea, unspecified: Secondary | ICD-10-CM | POA: Diagnosis not present

## 2019-05-31 DIAGNOSIS — C880 Waldenstrom macroglobulinemia: Secondary | ICD-10-CM | POA: Diagnosis not present

## 2019-05-31 DIAGNOSIS — D693 Immune thrombocytopenic purpura: Secondary | ICD-10-CM | POA: Diagnosis not present

## 2019-06-07 DIAGNOSIS — D693 Immune thrombocytopenic purpura: Secondary | ICD-10-CM | POA: Diagnosis not present

## 2019-06-07 DIAGNOSIS — C880 Waldenstrom macroglobulinemia: Secondary | ICD-10-CM | POA: Diagnosis not present

## 2019-07-02 DIAGNOSIS — E11319 Type 2 diabetes mellitus with unspecified diabetic retinopathy without macular edema: Secondary | ICD-10-CM | POA: Diagnosis not present

## 2019-07-04 DIAGNOSIS — Z23 Encounter for immunization: Secondary | ICD-10-CM | POA: Diagnosis not present

## 2019-07-28 ENCOUNTER — Other Ambulatory Visit: Payer: Self-pay | Admitting: Endocrinology

## 2019-08-03 ENCOUNTER — Other Ambulatory Visit: Payer: Self-pay | Admitting: Endocrinology

## 2019-11-05 DIAGNOSIS — E11319 Type 2 diabetes mellitus with unspecified diabetic retinopathy without macular edema: Secondary | ICD-10-CM | POA: Diagnosis not present

## 2019-11-05 DIAGNOSIS — E1165 Type 2 diabetes mellitus with hyperglycemia: Secondary | ICD-10-CM | POA: Diagnosis not present

## 2019-11-29 ENCOUNTER — Ambulatory Visit: Admit: 2019-11-29 | Payer: PRIVATE HEALTH INSURANCE

## 2019-12-06 ENCOUNTER — Ambulatory Visit: Admit: 2019-12-06 | Payer: PRIVATE HEALTH INSURANCE | Attending: Hematology & Oncology

## 2019-12-16 ENCOUNTER — Inpatient Hospital Stay: Admit: 2019-12-16 | Discharge: 2019-12-16 | Payer: MEDICARE

## 2019-12-16 ENCOUNTER — Encounter: Admit: 2019-12-16 | Payer: PRIVATE HEALTH INSURANCE | Attending: Hematology & Oncology

## 2019-12-16 DIAGNOSIS — C880 Waldenstrom macroglobulinemia: Secondary | ICD-10-CM | POA: Diagnosis not present

## 2019-12-16 DIAGNOSIS — D693 Immune thrombocytopenic purpura: Secondary | ICD-10-CM | POA: Diagnosis not present

## 2019-12-16 LAB — COMPREHENSIVE METABOLIC PANEL
BKR A/G RATIO: 2 (ref 1.0–2.2)
BKR ALANINE AMINOTRANSFERASE (ALT): 23 U/L (ref 9–59)
BKR ALBUMIN: 4.5 g/dL (ref 3.6–4.9)
BKR ALKALINE PHOSPHATASE: 75 U/L (ref 9–122)
BKR ANION GAP: 9 (ref 7–17)
BKR ASPARTATE AMINOTRANSFERASE (AST): 25 U/L (ref 10–35)
BKR AST/ALT RATIO: 1.1 (ref 0.3–4.9)
BKR BILIRUBIN TOTAL: 0.4 mg/dL (ref <=1.2)
BKR BLOOD UREA NITROGEN: 11 mg/dL (ref 6–20)
BKR BUN / CREAT RATIO: 7.9 — ABNORMAL LOW (ref 8.0–23.0)
BKR CALCIUM: 9.4 mg/dL (ref 8.8–10.2)
BKR CHLORIDE: 101 mmol/L (ref 98–107)
BKR CO2: 27 mmol/L (ref 20–30)
BKR CREATININE: 1.39 mg/dL — ABNORMAL HIGH (ref 0.40–1.30)
BKR EGFR (AFR AMER): >60 mL/min/{1.73_m2} (ref >60)
BKR EGFR (NON AFRICAN AMERICAN): 53 mL/min/{1.73_m2} (ref >60)
BKR GLOBULIN: 2.2 g/dL — ABNORMAL LOW (ref 2.3–3.5)
BKR GLUCOSE: 203 mg/dL — ABNORMAL HIGH (ref 70–100)
BKR POTASSIUM: 4.5 mmol/L (ref 3.3–5.1)
BKR PROTEIN TOTAL: 6.7 g/dL (ref 6.6–8.7)
BKR SODIUM: 137 mmol/L (ref 136–144)

## 2019-12-16 LAB — IMMUNOGLOBULINS IGG, IGA, IGM
BKR IGA: 11 mg/dL — ABNORMAL LOW (ref 70–470)
BKR IGG: 565 mg/dL — ABNORMAL LOW (ref 700–1600)
BKR IGM: 23 mg/dL — ABNORMAL LOW (ref 40–230)

## 2019-12-16 LAB — CBC WITH AUTO DIFFERENTIAL
BKR WAM ABSOLUTE IMMATURE GRANULOCYTES: 0 x 1000/ÂµL (ref 0.0–0.3)
BKR WAM ABSOLUTE LYMPHOCYTE COUNT: 1.4 x 1000/ÂµL (ref 1.0–4.0)
BKR WAM ABSOLUTE NRBC: 0 x 1000/ÂµL (ref 0.0–0.0)
BKR WAM ANALYZER ANC: 2.6 x 1000/ÂµL (ref 1.0–11.0)
BKR WAM BASOPHIL ABSOLUTE COUNT: 0.1 x 1000/ÂµL — ABNORMAL HIGH (ref 0.0–0.0)
BKR WAM BASOPHILS: 1.1 % (ref 0.0–4.0)
BKR WAM EOSINOPHIL ABSOLUTE COUNT: 0.2 x 1000/ÂµL (ref 0.0–1.0)
BKR WAM EOSINOPHILS: 5.1 % (ref 0.0–7.0)
BKR WAM HEMATOCRIT: 41.1 % (ref 37.0–52.0)
BKR WAM HEMOGLOBIN: 13.8 g/dL (ref 12.0–18.0)
BKR WAM IMMATURE GRANULOCYTES: 0.6 % (ref 0.0–3.0)
BKR WAM LYMPHOCYTES: 30.4 % (ref 8.0–49.0)
BKR WAM MCH (PG): 29.5 pg (ref 27.0–31.0)
BKR WAM MCHC: 33.6 g/dL (ref 31.0–36.0)
BKR WAM MCV: 87.8 fL (ref 78.0–94.0)
BKR WAM MONOCYTE ABSOLUTE COUNT: 0.4 x 1000/ÂµL (ref 0.0–2.0)
BKR WAM MONOCYTES: 8.3 % (ref 4.0–15.0)
BKR WAM MPV: 10.1 fL (ref 6.0–11.0)
BKR WAM NEUTROPHILS: 54.5 % (ref 37.0–84.0)
BKR WAM NUCLEATED RED BLOOD CELLS: 0 % (ref 0.0–1.0)
BKR WAM PLATELETS: 56 x1000/ÂµL — ABNORMAL LOW (ref 140–440)
BKR WAM RDW-CV: 12.9 % (ref 11.5–14.5)
BKR WAM RED BLOOD CELL COUNT: 4.7 M/ÂµL (ref 3.8–5.9)
BKR WAM WHITE BLOOD CELL COUNT: 4.7 x1000/ÂµL (ref 4.0–10.0)

## 2019-12-16 LAB — TOTAL PROTEIN AND ALBUMIN,SERUM  (YH)
BKR ALBUMIN, SERUM: 4.5 g/dL (ref 3.6–4.9)
BKR TOTAL PROTEIN, SERUM: 6.5 g/dL (ref 6.0–8.3)

## 2019-12-16 LAB — BETA-2 MICROGLOBULIN, SERUM: BKR BETA-2 MICROGLOBULIN: 2.46 mg/L — ABNORMAL HIGH (ref 0.80–2.20)

## 2019-12-16 LAB — IMMATURE PLATELET FRACTION (BH GH LMW YH)
BKR WAM IPF, ABSOLUTE: 2.1 x1000/ÂµL (ref <20.0)
BKR WAM IPF: 3.8 % (ref 1.2–8.6)

## 2019-12-17 LAB — FREE KAPPA LAMBDA WITH RATIO, SERUM
BKR KAPPA FLC, SERUM: 1.9 mg/dL (ref 0.33–1.94)
BKR KAPPA/LAMBDA FLC RATIO, SERUM: 1.01 (ref 0.26–1.65)
BKR LAMBDA FLC, SERUM: 1.88 mg/dL (ref 0.57–2.63)

## 2019-12-18 ENCOUNTER — Telehealth: Admit: 2019-12-18 | Payer: PRIVATE HEALTH INSURANCE

## 2019-12-18 NOTE — Telephone Encounter
Patient called wants to know if his appt can be switched to a my chart appt.

## 2019-12-19 LAB — PROTEIN ELECTROPHORESIS, SERUM    (YH)
BKR ALBUMIN ELP: 4.25 g/dL (ref 3.50–4.70)
BKR ALPHA-1-GLOBULIN: 0.2 g/dL (ref 0.10–0.30)
BKR ALPHA-2-GLOBULIN: 0.77 g/dL (ref 0.60–1.00)
BKR BETA GLOBULIN: 0.72 g/dL (ref 0.70–1.20)
BKR GAMMA GLOBULIN: 0.55 g/dL — ABNORMAL LOW (ref 0.70–1.50)

## 2019-12-19 LAB — IMMUNOFIXATION ELECTROPHORESIS, SERUM

## 2019-12-23 ENCOUNTER — Ambulatory Visit: Admit: 2019-12-23 | Payer: PRIVATE HEALTH INSURANCE | Attending: Hematology & Oncology

## 2019-12-23 ENCOUNTER — Ambulatory Visit: Admit: 2019-12-23 | Payer: MEDICARE | Attending: Hematology & Oncology

## 2019-12-23 DIAGNOSIS — C880 Waldenstrom macroglobulinemia: Secondary | ICD-10-CM

## 2019-12-24 NOTE — Progress Notes
Re: Arthur [name] (03/15/1966)MRN: [id number] Provider: Gevena Mart, MDDate of service: 4/19/2021VIDEO TELEHEALTH VISIT: This clinician is part of the telehealth program and is conducting this visit in a currently approved location. For this visit the clinician and patient were present via interactive audio & video telecommunications system that permits real-time communications.Patient/parent or guardian consent given for video visit: Yes State patient is located in: [place]The clinician is appropriately licensed in the above state to provide care for this visit. Other individuals present during the telehealth encounter and their role/relation: noneIf billing based on time, please complete (Not required if billing based on MDM):                           Total time spent in medical video consultation: 15 minutes; Total time spent by the provider on the day of service, which includes time spent on chart review, medical video consultation, education, coordination of care/services and counseling Because this visit was completed over video, a hands-on physical exam was not performed.  Patient/parent or guardian understands and knows to call back if condition changes.REFERRING PROVIDER: No ref. 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  Overall, he is feeling very well.  He notes some minor hoarseness.  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  He is managing well with COVID precautions and is due for his first dose of the COVID vaccine this week.  There are no new medical problems. Review of Systems Constitutional: Negative for activity change, appetite change, chills, fatigue, fever and unexpected weight change. HENT: Negative for congestion, ear pain, facial swelling and nosebleeds.  Eyes: Negative for discharge, redness and itching.      MILD GLAUCOMA, RETINOPATHY Respiratory: Negative for cough, chest tightness, shortness of breath and wheezing.  Cardiovascular: Negative for chest pain, palpitations and leg swelling. Gastrointestinal: Negative for abdominal distention, abdominal pain, anal bleeding, blood in stool, constipation, diarrhea, nausea and vomiting.      DECLINES COLONOSCOPY Endocrine: Negative for cold intolerance and heat intolerance. Genitourinary: Negative for difficulty urinating. Musculoskeletal: Negative for neck pain. Allergic/Immunologic: Negative for environmental allergies. Neurological: Negative for dizziness, syncope, speech difficulty, weakness, numbness and headaches.      NO NEUROPATHY Hematological: Negative for adenopathy. Does not bruise/bleed easily. Psychiatric/Behavioral: Negative for agitation and confusion. The patient is not nervous/anxious.  A comprehensive 12-point review of systems was queried and is otherwise negative.PAST MEDICAL HISTORY: Arthur [name] has a past medical history of Diabetes mellitus (HC Code), Hypercholesteremia, and Hypertension.PAST SURGICAL HISTORY: He has no past surgical history on file.ALLERGIES: Patient has no known allergies. MEDICATIONS: ?  atorvastatin, TAKE 1 TABLET BY MOUTH ONCE DAILY?  insulin glargine,hum.rec.anlog (TOUJEO SOLOSTAR U-300 INSULIN SUBQ), Inject under the skin.?  lisinopriL, TAKE 1 TABLET BY MOUTH ONCE DAILY?  metFORMIN, Take 1 tablet by mouth twice daily with foodSOCIAL HISTORY: Arthur [name]  reports that he quit smoking about 6 years ago. He has a 30.00 pack-year smoking history. He does not have any smokeless tobacco history on file. He reports current alcohol use of about 14.0 standard drinks of alcohol per week. No history on file for drug.FAMILY HISTORY: His family history includes Aneurysm in his mother.PHYSICAL EXAM:There were no vitals taken for this  visit.Data Review:Lab Results Component Value Date  WBC 4.7 12/16/2019  HGB 13.8 12/16/2019  HCT 41.1 12/16/2019  MCV 87.8 12/16/2019  PLT 56 (L) 12/16/2019   Chemistry  Lab Results Component Value Date  NA 137 12/16/2019  K 4.5 12/16/2019  CL 101 12/16/2019  CO2 27 12/16/2019  BUN 11 12/16/2019  CREATININE 1.39 (H) 12/16/2019  GLU 203 (H) 12/16/2019  Lab Results Component Value Date  CALCIUM 9.4 12/16/2019  ALKPHOS 75 12/16/2019  AST 25 12/16/2019  ALT 23 12/16/2019  BILITOT 0.4 12/16/2019  Component    Latest Ref Rng & Units 12/16/2019 SPEP Interpretation     No discrete abnormal bands. If clinically indicated and monoclonal gammopathy remains a consideration, recommend serum immunofixation electrophoresis (IFE) and serum free light chain measurement. Component    Latest Ref Rng & Units 12/16/2019 Immunofixation Electrophoresis Gel     Normal immunofixation electrophoresis. No evidence of a serum monoclonal component. Component    Latest Ref Rng & Units 12/16/2019 05/31/2019 03/05/2019 Immunoglobulin G    700-1,600 mg/dL 161 (L) 096 (L) 045 (L) Immunoglobulin A    70 - 470 mg/dL 11 (L) 10 (L) 11 (L) IgM    40 - 230 mg/dL 23 (L) 27 (L) 23 (L) Kappa Free Light Chains, Serum    0.33 - 1.94 mg/dL 4.09 8.11 9.14 Lambda Free Light Chains, Serum    0.57 - 2.63 mg/dL 7.82 9.56 2.13 Free Kappa/Lambda Ratio    0.26 - 1.65 1.01 1.20 1.45 Beta-2 Microglobulin    0.80 - 2.20 mg/L 2.46 (H) 2.82 (H) 2.59 (H) IMPRESSION and PLAN: 1.  Waldenstrom?s.  The patient is doing well.  His Waldenstrom?s is undetectable.  No treatment is needed.  He will continue surveillance at 6 months.  If that remains negative, we can go to one year.2.  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  This has been treated in the past.  It is stable and not symptomatic.  He will be followed conservatively.PLAN:Return in 6 months with [name] prior to visit.Scribed for Dr. Harlen [name] by Margot Chimes, medical scribe. April 19, 2021Electronically Signed by Margot Chimes, April 16, 2021The documentation recorded by the scribe accurately reflects the services I personally performed and the decisions made by me. I reviewed and confirmed all material pre-charted by the scribe at the time of the encounter.Electronically Signed by Margot Chimes, December 23, 2019

## 2020-01-31 ENCOUNTER — Encounter: Admit: 2020-01-31 | Payer: PRIVATE HEALTH INSURANCE | Attending: Vascular and Interventional Radiology

## 2020-06-03 ENCOUNTER — Encounter: Admit: 2020-06-03 | Payer: PRIVATE HEALTH INSURANCE | Attending: Vascular and Interventional Radiology

## 2020-06-10 ENCOUNTER — Encounter: Admit: 2020-06-10 | Payer: PRIVATE HEALTH INSURANCE | Attending: Vascular and Interventional Radiology

## 2020-06-15 ENCOUNTER — Encounter: Admit: 2020-06-15 | Payer: PRIVATE HEALTH INSURANCE | Attending: Vascular and Interventional Radiology

## 2020-06-16 ENCOUNTER — Inpatient Hospital Stay: Admit: 2020-06-16 | Discharge: 2020-06-16 | Payer: MEDICARE

## 2020-06-16 ENCOUNTER — Encounter: Admit: 2020-06-16 | Payer: PRIVATE HEALTH INSURANCE | Attending: Hematology & Oncology

## 2020-06-16 ENCOUNTER — Ambulatory Visit: Admit: 2020-06-16 | Payer: PRIVATE HEALTH INSURANCE | Attending: Hematology & Oncology

## 2020-06-16 DIAGNOSIS — C880 Waldenstrom macroglobulinemia: Secondary | ICD-10-CM

## 2020-06-16 LAB — CBC WITH AUTO DIFFERENTIAL
BKR WAM ABSOLUTE IMMATURE GRANULOCYTES: 0 x 1000/??L — ABNORMAL LOW (ref 0.0–0.3)
BKR WAM ABSOLUTE LYMPHOCYTE COUNT: 2.6 x 1000/??L (ref 1.0–4.0)
BKR WAM ABSOLUTE NRBC: 0 x 1000/??L (ref 0.0–0.0)
BKR WAM ANALYZER ANC: 3.3 x 1000/ÂµL (ref 1.0–11.0)
BKR WAM BASOPHIL ABSOLUTE COUNT: 0 x 1000/ÂµL (ref 0.0–0.0)
BKR WAM BASOPHILS: 0.6 % (ref 0.0–4.0)
BKR WAM EOSINOPHIL ABSOLUTE COUNT: 0.3 x 1000/ÂµL (ref 0.0–1.0)
BKR WAM EOSINOPHILS: 4.5 % (ref 0.0–7.0)
BKR WAM HEMATOCRIT: 45.5 % (ref 37.0–52.0)
BKR WAM HEMOGLOBIN: 15 g/dL (ref 12.0–18.0)
BKR WAM LYMPHOCYTES: 37.7 % (ref 8.0–49.0)
BKR WAM MCH (PG): 28.7 pg (ref 27.0–31.0)
BKR WAM MCHC: 33 g/dL (ref 31.0–36.0)
BKR WAM MCV: 87 fL (ref 78.0–94.0)
BKR WAM MONOCYTE ABSOLUTE COUNT: 0.6 x 1000/??L (ref 0.0–2.0)
BKR WAM MPV: 10.4 fL — ABNORMAL HIGH (ref 6.0–11.0)
BKR WAM NEUTROPHILS: 47.6 % (ref 37.0–84.0)
BKR WAM NUCLEATED RED BLOOD CELLS: 0 % (ref 0.0–1.0)
BKR WAM PLATELETS: 73 x1000/ÂµL — ABNORMAL LOW (ref 140–440)
BKR WAM RDW-CV: 13.5 % (ref 11.5–14.5)
BKR WAM RED BLOOD CELL COUNT: 5.2 M/ÂµL (ref 3.8–5.9)
BKR WAM WHITE BLOOD CELL COUNT: 6.9 x1000/??L (ref 4.0–10.0)

## 2020-06-16 LAB — COMPREHENSIVE METABOLIC PANEL
BKR A/G RATIO: 1.9 (ref 1.0–2.2)
BKR ALANINE AMINOTRANSFERASE (ALT): 24 U/L (ref 9–59)
BKR ALBUMIN: 4.6 g/dL (ref 3.6–4.9)
BKR ALKALINE PHOSPHATASE: 91 U/L (ref 9–122)
BKR ANION GAP: 6 — ABNORMAL LOW (ref 7–17)
BKR ASPARTATE AMINOTRANSFERASE (AST): 24 U/L (ref 10–35)
BKR AST/ALT RATIO: 1
BKR BILIRUBIN TOTAL: 0.4 mg/dL (ref <=1.2)
BKR BLOOD UREA NITROGEN: 22 mg/dL — ABNORMAL HIGH (ref 6–20)
BKR BUN / CREAT RATIO: 13.3 % (ref 8.0–23.0)
BKR CALCIUM: 9.9 mg/dL (ref 8.8–10.2)
BKR CHLORIDE: 101 mmol/L (ref 98–107)
BKR CO2: 28 mmol/L (ref 20–30)
BKR CREATININE: 1.66 mg/dL — ABNORMAL HIGH (ref 0.40–1.30)
BKR EGFR (AFR AMER): 52 mL/min/{1.73_m2} (ref >60)
BKR EGFR (NON AFRICAN AMERICAN): 43 mL/min/{1.73_m2} (ref >60)
BKR GLOBULIN: 2.4 g/dL (ref 2.3–3.5)
BKR GLUCOSE: 179 mg/dL — ABNORMAL HIGH (ref 70–100)
BKR POTASSIUM: 5.3 mmol/L — ABNORMAL HIGH (ref 3.3–5.1)
BKR PROTEIN TOTAL: 7 g/dL (ref 6.6–8.7)
BKR SODIUM: 135 mmol/L — ABNORMAL LOW (ref 136–144)
BKR WAM MONOCYTES: 0.4 mg/dL — ABNORMAL LOW (ref <=1.2)

## 2020-06-16 LAB — IMMATURE PLATELET FRACTION (BH GH YH)
BKR WAM IPF, ABSOLUTE: 3.1 x1000/??L — ABNORMAL LOW (ref <20.0)
BKR WAM IPF: 4.3 % (ref 1.2–8.6)

## 2020-06-16 LAB — IMMUNOGLOBULINS IGG, IGA, IGM
BKR IGA: 11 mg/dL — ABNORMAL LOW (ref 70–470)
BKR IGG: 597 mg/dL — ABNORMAL LOW (ref 700–1600)
BKR IGM: 38 mg/dL — ABNORMAL LOW (ref 40–230)
BKR KAPPA FLC, SERUM: 11 mg/dL — ABNORMAL LOW (ref 70–470)

## 2020-06-16 LAB — TOTAL PROTEIN AND ALBUMIN,SERUM  (YH)
BKR ALBUMIN, SERUM: 4.9 g/dL (ref 3.6–4.9)
BKR TOTAL PROTEIN, SERUM: 6.6 g/dL (ref 6.0–8.3)

## 2020-06-16 LAB — BETA-2 MICROGLOBULIN, SERUM: BKR BETA-2 MICROGLOBULIN: 3.28 mg/L — ABNORMAL HIGH (ref 0.80–2.20)

## 2020-06-17 ENCOUNTER — Encounter: Admit: 2020-06-17 | Payer: PRIVATE HEALTH INSURANCE | Attending: Vascular and Interventional Radiology

## 2020-06-17 LAB — FREE KAPPA LAMBDA WITH RATIO, SERUM
BKR KAPPA/LAMBDA FLC RATIO, SERUM: 0.98 (ref 0.26–1.65)
BKR LAMBDA FLC, SERUM: 1.95 mg/dL — ABNORMAL LOW (ref 0.57–2.63)

## 2020-06-17 LAB — IMMUNOFIXATION ELECTROPHORESIS, SERUM

## 2020-06-18 LAB — PROTEIN ELECTROPHORESIS, SERUM    (YH)
BKR ALBUMIN ELP: 4.16 g/dL (ref 3.50–4.70)
BKR ALPHA-1-GLOBULIN: 0.22 g/dL (ref 0.10–0.30)
BKR ALPHA-2-GLOBULIN: 0.87 g/dL (ref 0.60–1.00)
BKR BETA GLOBULIN: 0.86 g/dL (ref 0.70–1.20)
BKR GAMMA GLOBULIN: 0.49 g/dL — ABNORMAL LOW (ref 0.70–1.50)

## 2020-06-19 ENCOUNTER — Encounter: Admit: 2020-06-19 | Payer: PRIVATE HEALTH INSURANCE | Attending: Hematology & Oncology

## 2020-06-23 ENCOUNTER — Ambulatory Visit: Admit: 2020-06-23 | Payer: MEDICARE | Attending: Hematology & Oncology

## 2020-06-24 DIAGNOSIS — C880 Waldenstrom macroglobulinemia: Secondary | ICD-10-CM

## 2020-06-24 DIAGNOSIS — C760 Malignant neoplasm of head, face and neck: Secondary | ICD-10-CM

## 2020-06-24 NOTE — Progress Notes
Re: Arthur [name] (Mar 28, 1966)MRN: [id number] Provider: Gevena Mart, MDDate of service: 10/19/2021VIDEO TELEMEDICINE VISIT: This clinician is part of the telehealth program and is conducting this visit at a site within our enrolled clinical location. For this visit the clinician and patient were present via interactive audio & video telecommunications system that permits real-time communications.State patient is located in: [place]The clinician is appropriately licensed in the above state to provide care for this visit.Other individuals present during the telemedicine encounter and their role/relation: noneTotal time spent in medical video consultation (required if billing based on time): 15REFERRING PROVIDER: No ref. 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 He has a prior significant history of smoking. Approximately 1 year ago, he started to note intermittent hoarseness. He was seen by ENT. Initial evaluation in Spring 2021 was unremarkable. His symptoms progressed and on 06/15/2020 he underwent a laryngoscopy with the finding of a mass in the left vocal cord, extending to the false vocal cords and arytenoid area. There was no mobility in the left cord. 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 On his initial evaluation by ENT, he was not noted to have palpable adenopathy. The patient has noted some discomfort in his left neck.He has significant hoarseness. There is no history of dysphagia. He notes mild dyspnea on exertion. He has been abstinent of smoking. Review of Systems Constitutional: Negative for activity change, appetite change, chills, fatigue, fever and unexpected weight change. HENT: Positive for voice change. Negative for congestion, ear pain, facial swelling and nosebleeds.  Eyes: Negative for discharge, redness and itching.      MILD GLAUCOMA, RETINOPATHY Respiratory: Negative for cough, chest tightness, shortness of breath and wheezing.       Increased DOE Cardiovascular: Negative for chest pain, palpitations and leg swelling. Gastrointestinal: Negative for abdominal distention, abdominal pain, anal bleeding, blood in stool, constipation, diarrhea, nausea and vomiting.      DECLINES COLONOSCOPYNo Dysphagia Endocrine: Negative for cold intolerance and heat intolerance. Genitourinary: Negative for difficulty urinating. Musculoskeletal: Negative for neck pain. Allergic/Immunologic: Negative for environmental allergies. Neurological: Negative for dizziness, syncope, speech difficulty, weakness, numbness and headaches.      NO NEUROPATHY Hematological: Positive for adenopathy. Does not bruise/bleed easily.      Left cervical adenopathy Psychiatric/Behavioral: Negative for agitation and confusion. The patient is not nervous/anxious.  A comprehensive 12-point review of systems was queried and is otherwise negative.PAST MEDICAL HISTORY: Arthur [name] has a past medical history of Diabetes mellitus (HC Code), Hypercholesteremia, and Hypertension.PAST SURGICAL HISTORY: He has no past surgical history on file.ALLERGIES: Patient has no known allergies. MEDICATIONS: ?  atorvastatin, TAKE 1 TABLET BY MOUTH ONCE DAILY?  insulin glargine,hum.rec.anlog (TOUJEO SOLOSTAR U-300 INSULIN SUBQ), Inject under the skin.?  lisinopriL, TAKE 1 TABLET BY MOUTH ONCE DAILY?  metFORMIN, Take 1 tablet by mouth twice daily with foodSOCIAL HISTORY: [name]  reports that he quit smoking about 7 years ago. He has a 30.00 pack-year smoking history. He does not have any smokeless  tobacco history on file. He reports current alcohol use of about 14.0 standard drinks of alcohol per week. No history on file for drug use.FAMILY HISTORY: His family history includes Aneurysm in his mother.PHYSICAL EXAM:There were no vitals taken for this visit.Data Review:Lab Results Component Value Date  WBC 6.9 06/16/2020  HGB 15.0 06/16/2020  HCT 45.5 06/16/2020  MCV 87.0 06/16/2020  PLT 73 (L) 06/16/2020   Chemistry  Lab Results Component Value Date  NA 135 (L) 06/16/2020  K 5.3 (H) 06/16/2020  CL 101 06/16/2020  CO2 28 06/16/2020  BUN 22 (H) 06/16/2020  CREATININE 1.66 (H) 06/16/2020  GLU 179 (H) 06/16/2020  Lab Results Component Value Date  CALCIUM 9.9 06/16/2020  ALKPHOS 91 06/16/2020  AST 24 06/16/2020  ALT 24 06/16/2020  BILITOT 0.4 06/16/2020  Component    Latest Ref Rng & Units 12/16/2019 SPEP Interpretation     No discrete abnormal bands. If clinically indicated and monoclonal gammopathy remains a consideration, recommend serum immunofixation electrophoresis (IFE) and serum free light chain measurement. Component    Latest Ref Rng & Units 12/16/2019 Immunofixation Electrophoresis Gel     Normal immunofixation electrophoresis. No evidence of a serum monoclonal component. Component    Latest Ref Rng & Units 12/16/2019 05/31/2019 03/05/2019 Immunoglobulin G    700-1,600 mg/dL 161 (L) 096 (L) 045 (L) Immunoglobulin A    70 - 470 mg/dL 11 (L) 10 (L) 11 (L) IgM    40 - 230 mg/dL 23 (L) 27 (L) 23 (L) Kappa Free Light Chains, Serum    0.33 - 1.94 mg/dL 4.09 8.11 9.14 Lambda Free Light Chains, Serum    0.57 - 2.63 mg/dL 7.82 9.56 2.13 Free Kappa/Lambda Ratio    0.26 - 1.65 1.01 1.20 1.45 Beta-2 Microglobulin    0.80 - 2.20 mg/L 2.46 (H) 2.82 (H) 2.59 (H) IMPRESSION and PLAN: 1. 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 . He has a clinical T3 lesion. Laryngeal sparing would be optimal. He is due to see Dr Quenton Fetter for evaluation. A PET scan will be deferred to Dr Quenton Fetter as he will use this for the purposes of simulation. If his disease is localized, I anticipate treatment with combined modality Cisplatin and radiation for the purpose of curing his disease.  His chronic thrombocytopenia may prove to be dose-limiting and will need to be taken into account with respect to chemotherapy treatment planning2. Arthur?s: Disease has been in remission.3. ITP:  He has chronic stable thrombocytopeniaPLAN:1. Consult Dr Quenton Fetter.2. Return 3 weeks, reevaluation.Scribed for Gevena Mart, MD by Beatrix Fetters, medical scribe October 19, 2021The documentation recorded by the scribe accurately reflects the services I personally performed and the decisions made by me. I reviewed and confirmed all material entered and/or pre-charted by the scribe.

## 2020-06-25 ENCOUNTER — Encounter: Admit: 2020-06-25 | Payer: PRIVATE HEALTH INSURANCE | Attending: Vascular and Interventional Radiology

## 2020-06-25 ENCOUNTER — Encounter: Admit: 2020-06-25 | Payer: PRIVATE HEALTH INSURANCE

## 2020-07-01 ENCOUNTER — Encounter: Admit: 2020-07-01 | Payer: PRIVATE HEALTH INSURANCE | Attending: Vascular and Interventional Radiology

## 2020-07-15 ENCOUNTER — Encounter: Admit: 2020-07-15 | Payer: PRIVATE HEALTH INSURANCE | Attending: Hematology & Oncology

## 2020-07-15 ENCOUNTER — Ambulatory Visit: Admit: 2020-07-15 | Payer: MEDICARE | Attending: Hematology & Oncology

## 2020-07-15 ENCOUNTER — Ambulatory Visit: Admit: 2020-07-15 | Payer: PRIVATE HEALTH INSURANCE | Attending: Hematology & Oncology

## 2020-07-15 ENCOUNTER — Inpatient Hospital Stay: Admit: 2020-07-15 | Discharge: 2020-07-15 | Payer: MEDICARE

## 2020-07-15 DIAGNOSIS — C760 Malignant neoplasm of head, face and neck: Secondary | ICD-10-CM

## 2020-07-15 LAB — COMPREHENSIVE METABOLIC PANEL
BKR A/G RATIO: 1.7 (ref 1.0–2.2)
BKR ALANINE AMINOTRANSFERASE (ALT): 24 U/L (ref 9–59)
BKR ALBUMIN: 4.8 g/dL (ref 3.6–4.9)
BKR ALKALINE PHOSPHATASE: 122 U/L — ABNORMAL LOW (ref 9–122)
BKR ANION GAP: 10 (ref 7–17)
BKR ASPARTATE AMINOTRANSFERASE (AST): 31 U/L (ref 10–35)
BKR AST/ALT RATIO: 1.3
BKR BILIRUBIN TOTAL: 0.5 mg/dL (ref <=1.2)
BKR BLOOD UREA NITROGEN: 16 mg/dL (ref 6–20)
BKR BUN / CREAT RATIO: 10.5 (ref 8.0–23.0)
BKR CALCIUM: 9.6 mg/dL (ref 8.8–10.2)
BKR CHLORIDE: 99 mmol/L (ref 98–107)
BKR CO2: 26 mmol/L (ref 20–30)
BKR CREATININE: 1.53 mg/dL — ABNORMAL HIGH (ref 0.40–1.30)
BKR EGFR (AFR AMER): 58 mL/min/{1.73_m2} (ref >60)
BKR EGFR (NON AFRICAN AMERICAN): 47 mL/min/{1.73_m2} (ref >60)
BKR GLOBULIN: 2.8 g/dL (ref 2.3–3.5)
BKR GLUCOSE: 283 mg/dL — ABNORMAL HIGH (ref 70–100)
BKR POTASSIUM: 4.4 mmol/L (ref 3.3–5.1)
BKR PROTEIN TOTAL: 7.6 g/dL (ref 6.6–8.7)
BKR SODIUM: 135 mmol/L — ABNORMAL LOW (ref 136–144)
BKR WAM IMMATURE GRANULOCYTES: 16 mg/dL (ref 6–20)

## 2020-07-15 LAB — CBC WITH AUTO DIFFERENTIAL
BKR WAM ABSOLUTE IMMATURE GRANULOCYTES.: 0.05 x 1000/ÂµL (ref 0.00–0.30)
BKR WAM ABSOLUTE LYMPHOCYTE COUNT.: 1.93 x 1000/ÂµL (ref 0.60–3.70)
BKR WAM ABSOLUTE NRBC (2 DEC): 0 x 1000/??L — ABNORMAL HIGH (ref 0.00–1.00)
BKR WAM ANALYZER ANC: 4.93 x 1000/ÂµL (ref 2.00–7.60)
BKR WAM BASOPHIL ABSOLUTE COUNT.: 0.06 x 1000/ÂµL (ref 0.00–1.00)
BKR WAM BASOPHILS: 0.8 % (ref 0.0–1.4)
BKR WAM EOSINOPHIL ABSOLUTE COUNT.: 0.17 x 1000/ÂµL (ref 0.00–1.00)
BKR WAM EOSINOPHILS: 2.1 % (ref 0.0–5.0)
BKR WAM HEMATOCRIT (2 DEC): 47.3 % (ref 38.50–50.00)
BKR WAM HEMOGLOBIN: 15.8 g/dL (ref 13.2–17.1)
BKR WAM IMMATURE GRANULOCYTES: 0.6 % (ref 0.0–1.0)
BKR WAM LYMPHOCYTES: 24.2 % (ref 17.0–50.0)
BKR WAM MCH (PG): 28.8 pg (ref 27.0–33.0)
BKR WAM MCHC: 33.4 g/dL — ABNORMAL HIGH (ref 31.0–36.0)
BKR WAM MCV: 86.3 fL (ref 80.0–100.0)
BKR WAM MONOCYTE ABSOLUTE COUNT.: 0.83 x 1000/??L (ref 0.00–1.00)
BKR WAM MONOCYTES: 10.4 % (ref 4.0–12.0)
BKR WAM MPV: 10.2 fL (ref 8.0–12.0)
BKR WAM NEUTROPHILS: 61.9 % (ref 39.0–72.0)
BKR WAM NUCLEATED RED BLOOD CELLS: 0 % (ref 0.0–1.0)
BKR WAM PLATELETS: 110 x1000/??L — ABNORMAL LOW (ref 150–420)
BKR WAM RDW-CV: 13.5 % (ref 11.0–15.0)
BKR WAM RED BLOOD CELL COUNT.: 5.48 M/ÂµL (ref 4.00–6.00)
BKR WAM WHITE BLOOD CELL COUNT: 8 x1000/ÂµL (ref 4.0–11.0)

## 2020-07-15 MED ORDER — JARDIANCE 10 MG TABLET
10 mg | Status: AC
Start: 2020-07-15 — End: 2020-09-23

## 2020-07-15 MED ORDER — TRAZODONE 50 MG TABLET
50 mg | ORAL_TABLET | Freq: Every evening | ORAL | 1 refills | Status: AC
Start: 2020-07-15 — End: 2020-09-23

## 2020-07-15 MED ORDER — FEXOFENADINE 60 MG TABLET
60 mg | Status: AC
Start: 2020-07-15 — End: 2020-09-23

## 2020-07-15 MED ORDER — OMEPRAZOLE 40 MG CAPSULE,DELAYED RELEASE
40 mg | Status: AC
Start: 2020-07-15 — End: 2020-09-23

## 2020-07-15 MED ORDER — TOUJEO MAX U-300 SOLOSTAR 300 UNIT/ML (3 ML) SUBCUTANEOUS INSULIN PEN
300 unit/mL (3 mL) | Status: AC
Start: 2020-07-15 — End: 2020-09-23

## 2020-07-15 NOTE — Other
START ON PATHWAY REGIMEN - Head and Neck    Cisplatin (Platinol) **Always confirm dose/schedule in your pharmacy ordering system**Patient Characteristics:Larynx, Preoperative or Nonsurgical Candidate, Stage III - IVBDisease Classification: LarynxAJCC T Category: T3AJCC 8 Stage Grouping: IIITherapeutic Status: Preoperative or Nonsurgical CandidateAJCC N Category: cN0AJCC M Category: M0Intent of Therapy:Curative Intent, Discussed with Patient

## 2020-07-15 NOTE — Telephone Encounter
Patient notified prescription for Trazodone was sent to [facility] in [place].

## 2020-07-15 NOTE — Telephone Encounter
atient asked id something was called to help him sleep

## 2020-07-16 NOTE — Progress Notes
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 He is becoming increasingly short of breath, including while talking. He notes increased pain. There is mild dysphagia for solids more than liquids.The patient has been seen by radiation. Dental consultation is pending on 11/15. Extraction has been recommended. A feeding tube is scheduled to be placed. Review of Systems Constitutional: Negative for activity change, appetite change, chills, fatigue, fever and unexpected weight change. HENT: Positive for voice change. Negative for congestion, ear pain, facial swelling and nosebleeds.       Left neck pain Eyes: Negative for discharge, redness and itching.      MILD GLAUCOMA, RETINOPATHY Respiratory: Negative for cough, chest tightness, shortness of breath and wheezing.       Increased DOE Cardiovascular: Negative for chest pain, palpitations and leg swelling. Gastrointestinal: Negative for abdominal distention, abdominal pain, anal bleeding, blood in stool, constipation, diarrhea, nausea and vomiting.      DECLINES COLONOSCOPYIncreased dysphagia Endocrine: Negative for cold intolerance and heat intolerance. Genitourinary: Negative for difficulty urinating. Musculoskeletal: Negative for neck pain. Allergic/Immunologic: Negative for environmental allergies. Neurological: Negative for dizziness, syncope, speech difficulty, weakness, numbness and headaches.      NO NEUROPATHY Hematological: Positive for adenopathy. Does not bruise/bleed easily.      Left cervical adenopathy Psychiatric/Behavioral: Negative for agitation and confusion. The patient is not nervous/anxious.  A comprehensive 12-point review of systems was queried and is otherwise negative.PAST MEDICAL HISTORY: Gid Schoffstall has a past medical history of Diabetes mellitus (HC Code), Hypercholesteremia, and Hypertension.PAST SURGICAL HISTORY: He has no past surgical history on file.ALLERGIES: Patient has no known allergies. MEDICATIONS: ?  atorvastatin, TAKE 1 TABLET BY MOUTH ONCE DAILY?  fexofenadine, ?  insulin glargine,hum.rec.anlog (TOUJEO SOLOSTAR U-300 INSULIN SUBQ), Inject under the skin.?  Jardiance, ?  lisinopriL, TAKE 1 TABLET BY MOUTH ONCE DAILY?  metFORMIN, Take 1 tablet by mouth twice daily with food?  omeprazole, ?  Toujeo Max U-300 SoloStar, ?  traZODone, 50 mg, Oral, NightlySOCIAL HISTORY: Mr. Macqueen  reports that he quit smoking about 7 years ago. He has a 30.00 pack-year smoking history. He does not have any smokeless tobacco history on file. He reports current alcohol use of about 14.0 standard drinks of alcohol per week. No history on file for drug use.FAMILY HISTORY: His family history includes Aneurysm in his mother.PHYSICAL EXAM:Vitals:  07/15/20 1000 BP: (!) 170/110 Pulse:  Resp:  Temp:  Physical ExamConstitutional:     General: He is in acute distress.    Appearance: He is well-developed. HENT:    Head: Normocephalic and atraumatic. Eyes:    Conjunctiva/sclera: Conjunctivae normal. Neck:    Thyroid: No thyromegaly. Cardiovascular:    Rate and Rhythm: Normal rate and regular rhythm.    Heart sounds: No murmur heard. Pulmonary:    Breath sounds: Normal breath sounds. No wheezing or rales. Chest:    Breasts:       Right: No mass.       Left: No mass. Abdominal:    General: Bowel sounds are normal. There  is no distension.    Palpations: Abdomen is soft. There is no mass.    Tenderness: There is no abdominal tenderness. Musculoskeletal:       General: Normal range of motion.    Cervical back: Neck supple. Lymphadenopathy:    Cervical: No cervical adenopathy.    Upper Body:    Right upper body: No supraclavicular adenopathy.    Left upper body: No supraclavicular adenopathy. Skin:   General: Skin is warm and dry.    Findings: No ecchymosis, erythema, petechiae or rash. Neurological:    Mental Status: He is alert.    Sensory: No sensory deficit. Data Review:Lab Results Component Value Date  WBC 8.0 07/15/2020  HGB 15.8 07/15/2020  HCT 47.30 07/15/2020  MCV 86.3 07/15/2020  PLT 110 (L) 07/15/2020   Chemistry  Lab Results Component Value Date  NA 135 (L) 07/15/2020  K 4.4 07/15/2020  CL 99 07/15/2020  CO2 26 07/15/2020  BUN 16 07/15/2020  CREATININE 1.53 (H) 07/15/2020  GLU 283 (H) 07/15/2020  Lab Results Component Value Date  CALCIUM 9.6 07/15/2020  ALKPHOS 122 07/15/2020  AST 31 07/15/2020  ALT 24 07/15/2020  BILITOT 0.5 07/15/2020  Component    Latest Ref Rng & Units 06/16/2020 eGFR (Afr Amer)    >60 mL/min/1.58m2 52 Component    Latest Ref Rng & Units 06/16/2020 Immunofixation Electrophoresis Gel     Normal immunofixation electrophoresis. No evidence of a serum monoclonal component. Component    Latest Ref Rng & Units 06/16/2020 12/16/2019 05/31/2019 03/05/2019 Immunoglobulin G    700-1,600 mg/dL 161 (L) 096 (L) 045 (L) 542 (L) Immunoglobulin A    70 - 470 mg/dL 11 (L) 11 (L) 10 (L) 11 (L) IgM    40 - 230 mg/dL 38 (L) 23 (L) 27 (L) 23 (L) Kappa Free Light Chains, Serum    0.33 - 1.94 mg/dL 4.09 8.11 9.14 7.82 Lambda Free Light Chains, Serum    0.57 - 2.63 mg/dL 9.56 2.13 0.86 5.78 Free Kappa/Lambda Ratio    0.26 - 1.65 0.98 1.01 1.20 1.45 Beta-2 Microglobulin    0.80 - 2.20 mg/L 3.28 (H) 2.46 (H) 2.82 (H) 2.59 (H) 07/01/2020 PET/CTHead/Neck: Hypermetabolic mass, left vocal cord 2.4 x 1.3 cm SUV 8.3, extends to the midline. There may be erosion of the thyroid cartilage. Vocal cords are nearly fully opposed. Hypermetabolic right sub-mandibular node 9 mm SUV 5.0.Left sub-mental node 9 mm SUV 1.3.Chest: Right subclavian lymph node 9 mm SUV 2.9.Axillary lymph nodes: Bilateral, non enlarged, right 12 mm SUV 3.6.Abdomen/Pelvis: Non-enlarged inguinal lymph nodes, left 9 mm SUV 11.4.Pelvis: Osseous uptake, left anterior iliac crest.IMPRESSION and PLAN: 1. Laryngeal cancer: The patient has locally advanced disease. He is becoming symptomatic. He is developing stridor and airway protection is a potential issue. This has been discussed with Dr Maralyn Sago, who will see him for consideration of a palliative tracheostomy.The patient has disease which appears to be within the larynx. The uptake in the supraclavicular lymph node, axilla, and inguinal region are small.  The multiple nodal areas of uptake would be atypical  for metastases and likely represent uptake from his previous Waldenstrom's.  His immunoglobulin levels are well controlled without evidence of active wall to strong this.  Even if he were to have known metastatic disease, he would need treatment to his larynx in an effort to preserve vocalization. I would concur with combined modality chemotherapy and radiation. He has a mildly impaired renal function. In addition he has diabetes and is at risk  for peripheral neuropathy. For this reason I would recommend weekly Cisplatin 40 mg/m2 rather than 100 mg every 3 weeks. We reviewed the status of his disease, prognosis, and treatment options. We discussed the risks, benefits, complications, alternatives, and side effects of Cisplatin. His questions have been answered. The patient signed informed consent. The patient has poor venous access and a Port-A-Cath will be inserted. A feeding tube may need to be considered. The patient will begin combined modality therapy as soon as his teeth have been addressed. 2. Hypertension: Repeat blood pressure 170/110. The patient will contact [facility] to adjust his medication.PLAN:1. Consult ENT for tracheostomy.2. Dentist.3. Feeding tube.4. Port-A-Cath.5. Combined modality therapy to be coordinated with the department of radiation oncology.Scribed for Gevena Mart, MD by Beatrix Fetters, medical scribe November 11, 2021The documentation recorded by the scribe accurately reflects the services I personally performed and the decisions made by me. I reviewed and confirmed all material entered and/or pre-charted by the scribe.

## 2020-07-17 ENCOUNTER — Telehealth: Admit: 2020-07-17 | Payer: PRIVATE HEALTH INSURANCE | Attending: Hematology & Oncology

## 2020-07-17 NOTE — Telephone Encounter
Spoke to [place] at radiation therapy.  Patient was admitted yesterday obstructed and a trach was placed.  Dr Daphene Jaeger notified.

## 2020-07-20 ENCOUNTER — Encounter: Admit: 2020-07-20 | Payer: PRIVATE HEALTH INSURANCE | Attending: Vascular and Interventional Radiology

## 2020-08-03 ENCOUNTER — Telehealth: Admit: 2020-08-03 | Payer: PRIVATE HEALTH INSURANCE | Attending: Hematology & Oncology

## 2020-08-03 ENCOUNTER — Encounter: Admit: 2020-08-03 | Payer: PRIVATE HEALTH INSURANCE | Attending: Hematology & Oncology

## 2020-08-03 ENCOUNTER — Encounter: Admit: 2020-08-03 | Payer: PRIVATE HEALTH INSURANCE | Attending: Vascular and Interventional Radiology

## 2020-08-03 NOTE — Telephone Encounter
Tacey Ruiz is asking to speak to pt nurse- she is bit confused on pt getting the Port-A-Cath. She can be reached [phone]

## 2020-08-04 ENCOUNTER — Telehealth: Admit: 2020-08-04 | Payer: PRIVATE HEALTH INSURANCE | Attending: Hematology & Oncology

## 2020-08-04 NOTE — Telephone Encounter
Spoke with [name] in regards to patient. Per Dr. Daphene Jaeger patient will keep his appointment tomorrow and will discuss treatment plan as well as port placement. As of now a start date for chemotherapy is unknown because patient does not have a date to begin radiation. Spoke with Lelon Mast, RN from radiation that said patient is still waiting for PEG placement as well as dental extraction prior to beginning. His insurance unfortunately does not cover radiation while he is at short term rehab. Dr. Daphene Jaeger made aware. Lelon Mast will discuss discharge plan with patient and will update [name] once new information is available.

## 2020-08-04 NOTE — Telephone Encounter
Samantha calling for Huntley Dec to give update on pt. She can be reached [phone]

## 2020-08-05 ENCOUNTER — Encounter: Admit: 2020-08-05 | Payer: PRIVATE HEALTH INSURANCE | Attending: Hematology & Oncology

## 2020-08-05 ENCOUNTER — Inpatient Hospital Stay: Admit: 2020-08-05 | Discharge: 2020-08-05 | Payer: MEDICARE

## 2020-08-05 ENCOUNTER — Ambulatory Visit: Admit: 2020-08-05 | Payer: PRIVATE HEALTH INSURANCE | Attending: Hematology & Oncology

## 2020-08-05 ENCOUNTER — Ambulatory Visit: Admit: 2020-08-05 | Payer: MEDICARE | Attending: Hematology & Oncology

## 2020-08-05 ENCOUNTER — Telehealth: Admit: 2020-08-05 | Payer: PRIVATE HEALTH INSURANCE | Attending: Hematology & Oncology

## 2020-08-05 DIAGNOSIS — I10 Essential (primary) hypertension: Secondary | ICD-10-CM

## 2020-08-05 DIAGNOSIS — C760 Malignant neoplasm of head, face and neck: Secondary | ICD-10-CM

## 2020-08-05 DIAGNOSIS — E119 Type 2 diabetes mellitus without complications: Secondary | ICD-10-CM

## 2020-08-05 DIAGNOSIS — E7800 Pure hypercholesterolemia, unspecified: Secondary | ICD-10-CM

## 2020-08-05 LAB — COMPREHENSIVE METABOLIC PANEL
BKR A/G RATIO: 1.5 x 1000/??L (ref 1.0–2.2)
BKR ALANINE AMINOTRANSFERASE (ALT): 18 U/L (ref 9–59)
BKR ALBUMIN: 3.9 g/dL (ref 3.6–4.9)
BKR ALKALINE PHOSPHATASE: 94 U/L (ref 9–122)
BKR ANION GAP: 9 pg (ref 7–17)
BKR ASPARTATE AMINOTRANSFERASE (AST): 19 U/L (ref 10–35)
BKR AST/ALT RATIO: 1.1
BKR BILIRUBIN TOTAL: 0.3 mg/dL (ref <=1.2)
BKR BLOOD UREA NITROGEN: 22 mg/dL — ABNORMAL HIGH (ref 6–20)
BKR BUN / CREAT RATIO: 13.7 (ref 8.0–23.0)
BKR CALCIUM: 9.5 mg/dL (ref 8.8–10.2)
BKR CHLORIDE: 100 mmol/L (ref 98–107)
BKR CO2: 27 mmol/L (ref 20–30)
BKR CREATININE: 1.61 mg/dL — ABNORMAL HIGH (ref 0.40–1.30)
BKR EGFR (AFR AMER): 54 mL/min/{1.73_m2} (ref >60)
BKR EGFR (NON AFRICAN AMERICAN): 45 mL/min/{1.73_m2} (ref >60)
BKR GLOBULIN: 2.6 g/dL (ref 2.3–3.5)
BKR GLUCOSE: 127 mg/dL — ABNORMAL HIGH (ref 70–100)
BKR POTASSIUM: 4.9 mmol/L (ref 3.3–5.1)
BKR SODIUM: 136 mmol/L (ref 136–144)

## 2020-08-05 LAB — CBC WITH AUTO DIFFERENTIAL
BKR PROTEIN TOTAL: 28.9 % — ABNORMAL LOW (ref 17.0–50.0)
BKR WAM ABSOLUTE IMMATURE GRANULOCYTES.: 0.04 x 1000/??L (ref 0.00–0.30)
BKR WAM ABSOLUTE LYMPHOCYTE COUNT.: 1.91 x 1000/ÂµL (ref 0.60–3.70)
BKR WAM ABSOLUTE NRBC (2 DEC): 0 x 1000/??L (ref 0.00–1.00)
BKR WAM ANALYZER ANC: 3.68 x 1000/??L (ref 2.00–7.60)
BKR WAM BASOPHIL ABSOLUTE COUNT.: 0.06 x 1000/??L (ref 0.00–1.00)
BKR WAM BASOPHILS: 0.9 % (ref 0.0–1.4)
BKR WAM EOSINOPHIL ABSOLUTE COUNT.: 0.29 x 1000/??L (ref 0.00–1.00)
BKR WAM EOSINOPHILS: 4.4 % (ref 0.0–5.0)
BKR WAM HEMATOCRIT (2 DEC): 39.7 % (ref 38.50–50.00)
BKR WAM HEMOGLOBIN: 13.1 g/dL — ABNORMAL LOW (ref 13.2–17.1)
BKR WAM IMMATURE GRANULOCYTES: 0.6 % (ref 0.0–1.0)
BKR WAM LYMPHOCYTES: 28.9 % (ref 17.0–50.0)
BKR WAM MCH (PG): 28.9 pg (ref 27.0–33.0)
BKR WAM MCHC: 33 g/dL (ref 31.0–36.0)
BKR WAM MCV: 87.6 fL (ref 80.0–100.0)
BKR WAM MONOCYTE ABSOLUTE COUNT.: 0.64 x 1000/??L (ref 0.00–1.00)
BKR WAM MONOCYTES: 9.7 % (ref 4.0–12.0)
BKR WAM MPV: 9.7 fL (ref 8.0–12.0)
BKR WAM NEUTROPHILS: 55.5 % (ref 39.0–72.0)
BKR WAM NUCLEATED RED BLOOD CELLS: 0 % (ref 0.0–1.0)
BKR WAM PLATELETS: 90 x1000/ÂµL — ABNORMAL LOW (ref 150–420)
BKR WAM RDW-CV: 12.6 % — ABNORMAL HIGH (ref 11.0–15.0)
BKR WAM RED BLOOD CELL COUNT.: 4.53 M/ÂµL (ref 4.00–6.00)
BKR WAM WHITE BLOOD CELL COUNT: 6.6 x1000/??L (ref 4.0–11.0)

## 2020-08-05 LAB — IMMATURE PLATELET FRACTION (BH GH YH)
BKR WAM IPF, ABSOLUTE: 2.2 x1000/ÂµL (ref <20.0)
BKR WAM IPF: 2.4 % (ref 1.2–8.6)

## 2020-08-05 LAB — MAGNESIUM: BKR MAGNESIUM: 1.6 mg/dL — ABNORMAL LOW (ref 1.7–2.4)

## 2020-08-05 NOTE — Progress Notes
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 At his last visit, he was found to be in respiratory distress with stridor. He required an emergency tracheostomy. He has been in rehab. He is scheduled for dental evaluation. The Port-A-Cath was not performed. Pain has been controlled. He has less shortness of breath. A feeding tube has not been placed.         Review of Systems   Constitutional: Negative for activity change, appetite change, chills, fatigue, fever and unexpected weight change.   HENT: Positive for voice change. Negative for congestion, ear pain, facial swelling and nosebleeds.         Left neck pain.  Tracheostomy   Eyes: Negative for discharge, redness and itching.        MILD GLAUCOMA, RETINOPATHY   Respiratory: Negative for cough, chest tightness, shortness of breath and wheezing.    Cardiovascular: Negative for chest pain, palpitations and leg swelling.   Gastrointestinal: Negative for abdominal distention, abdominal pain, anal bleeding, blood in stool, constipation, diarrhea, nausea and vomiting.        DECLINES COLONOSCOPY  Increased dysphagia   Endocrine: Negative for cold intolerance and heat intolerance.   Genitourinary: Negative for difficulty urinating.   Musculoskeletal: Negative for neck pain.   Allergic/Immunologic: Negative for environmental allergies.   Neurological: Negative for dizziness, syncope, speech difficulty, weakness, numbness and headaches.        NO NEUROPATHY   Hematological: Positive for adenopathy. Does not bruise/bleed easily.        Left cervical adenopathy   Psychiatric/Behavioral: Negative for agitation and confusion. The patient is not nervous/anxious.      A comprehensive 12-point review of systems was queried and is otherwise negative.    PAST MEDICAL HISTORY: Arthur [name] has a past medical history of Diabetes mellitus (HC Code) (HC CODE), Hypercholesteremia, and Hypertension.    PAST SURGICAL HISTORY: He has no past surgical history on file.    ALLERGIES: Patient has no known allergies.     MEDICATIONS:   ?  atorvastatin, TAKE 1 TABLET BY MOUTH ONCE DAILY  ?  fexofenadine,   ?  insulin glargine,hum.rec.anlog (TOUJEO SOLOSTAR U-300 INSULIN SUBQ), Inject under the skin.  ?  Jardiance,   ?  lisinopriL, TAKE 1 TABLET BY MOUTH ONCE DAILY  ?  metFORMIN, Take 1 tablet by mouth twice daily with food  ?  omeprazole,   ?  Toujeo Max U-300 SoloStar,   ?  traZODone, 50 mg, Oral, Nightly  SOCIAL HISTORY: Arthur [name]  reports that he quit smoking about 7 years ago. He has a 30.00 pack-year smoking history. He does not have any smokeless tobacco history on file. He reports current alcohol use of  about 14.0 standard drinks of alcohol per week. No history on file for drug use.    FAMILY HISTORY: His family history includes Aneurysm in his mother.    PHYSICAL EXAM:  Vitals:    08/05/20 1016   BP: 110/70   Pulse: (!) 111   Resp: 20   Temp: (!) 96.2 ?F (35.7 ?C)       Physical Exam  Constitutional:       General: He is not in acute distress.     Appearance: He is well-developed. He is ill-appearing.   HENT:      Head: Normocephalic and atraumatic.     Eyes:      Conjunctiva/sclera: Conjunctivae normal.   Neck:      Thyroid: No thyromegaly.   Cardiovascular:      Rate and Rhythm: Normal rate and regular rhythm.      Heart sounds: No murmur heard. Pulmonary:      Breath sounds: Normal breath sounds. No wheezing or rales.   Chest:      Breasts:         Right: No mass.         Left: No mass.   Abdominal:      General: Bowel sounds are normal. There is no distension.      Palpations: Abdomen is soft. There is no mass.      Tenderness: There is no abdominal tenderness.   Musculoskeletal:         General: Normal range of motion.      Cervical back: Neck supple.   Lymphadenopathy:      Cervical: No cervical adenopathy.      Upper Body:      Right upper body: No supraclavicular adenopathy.      Left upper body: No supraclavicular adenopathy.   Skin:     General: Skin is warm and dry.      Findings: No ecchymosis, erythema, petechiae or rash.   Neurological:      Mental Status: He is alert.      Sensory: No sensory deficit.           Data Review:    Lab Results   Component Value Date    WBC 6.6 08/05/2020    HGB 13.1 (L) 08/05/2020    HCT 39.70 08/05/2020    MCV 87.6 08/05/2020    PLT 90 (L) 08/05/2020       Chemistry    Lab Results   Component Value Date    NA 136 08/05/2020    K 4.9 08/05/2020    CL 100 08/05/2020    CO2 27 08/05/2020    BUN 22 (H) 08/05/2020    CREATININE 1.61 (H) 08/05/2020    GLU 127 (H) 08/05/2020    Lab Results   Component Value Date    CALCIUM 9.5 08/05/2020    ALKPHOS 94 08/05/2020    AST 19 08/05/2020    ALT 18 08/05/2020    BILITOT 0.3 08/05/2020        Component      Latest Ref Rng & Units 06/16/2020   eGFR (Afr Amer)      >60 mL/min/1.40m2 52     Component      Latest Ref Rng & Units 06/16/2020   Immunofixation Electrophoresis Gel       Normal immunofixation electrophoresis. No evidence of a serum monoclonal component.         Component      Latest Ref Rng &  Units 06/16/2020 12/16/2019 05/31/2019 03/05/2019   Immunoglobulin G      700-1,600 mg/dL 161 (L) 096 (L) 045 (L) 542 (L)   Immunoglobulin A      70 - 470 mg/dL 11 (L) 11 (L) 10 (L) 11 (L)   IgM      40 - 230 mg/dL 38 (L) 23 (L) 27 (L) 23 (L)   Kappa Free Light Chains, Serum 0.33 - 1.94 mg/dL 4.09 8.11 9.14 7.82   Lambda Free Light Chains, Serum      0.57 - 2.63 mg/dL 9.56 2.13 0.86 5.78   Free Kappa/Lambda Ratio      0.26 - 1.65 0.98 1.01 1.20 1.45   Beta-2 Microglobulin      0.80 - 2.20 mg/L 3.28 (H) 2.46 (H) 2.82 (H) 2.59 (H)       07/01/2020 PET/[place]  Head/Neck: Hypermetabolic mass, left vocal cord 2.4 x 1.3 cm SUV 8.3, extends to the midline.   There may be erosion of the thyroid cartilage. Vocal cords are nearly fully opposed.   Hypermetabolic right sub-mandibular node 9 mm SUV 5.0.  Left sub-mental node 9 mm SUV 1.3.  Chest: Right subclavian lymph node 9 mm SUV 2.9.  Axillary lymph nodes: Bilateral, non enlarged, right 12 mm SUV 3.6.  Abdomen/Pelvis: Non-enlarged inguinal lymph nodes, left 9 mm SUV 11.4.  Pelvis: Osseous uptake, left anterior iliac crest.        IMPRESSION and PLAN:   Laryngeal cancer: The patient has locally advanced laryngeal cancer. He required an emergency tracheostomy. Care needs to be coordinated to include a Port-A-Cath, radiation planning, dental planning, and G-tube. He is scheduled to be seen by dentistry soon. He has signed consent for cisplatin chemotherapy. A Port-A-Cath will be inserted. Treatment will be initiated when radiation is ready to begin treatment. Radiation will be planning dentistry and G-tube placement.     PLAN:  1. Followup appointment in approximately 1 month, in coordination with Radiation Oncology.        Scribed for Arthur Mart, MD by Beatrix Fetters, medical scribe August 05, 2020    The documentation recorded by the scribe accurately reflects the services I personally performed and the decisions made by me. I reviewed and confirmed all material entered and/or pre-charted by the scribe.

## 2020-08-05 NOTE — Progress Notes
Pt arrived for appointment with his niece. No complaints of pain and stated his breathing is better with the tracheostomy. He states nobody has showed him how to care for the tracheostomy. I called [facility] and spoke to his nurse who told me she will have the respiratory therapist teach him how to care for it. Diagnosis and treatment discussed by Dr Daphene Jaeger and all questions were answered. Pt has a follow up with pulmonology next week and is seeing a dentist tomorrow. Pt to see radiation oncology and discuss treatment and PEG tube placement. Referral made for PORT placement. Consent obtained. Pt given new patient packet and drug info sheets. Treatment plan and side effects briefly reviewed. Start date to be coordinated with radiation oncology and pt will be called with chemo teaching and cycle 1 appointment. Pt instructed to call with any questions or concerns prior to next appointment.

## 2020-08-05 NOTE — Telephone Encounter
Lelon Mast updated that she spoke with the social worker at the rehab and respiratory felt that the trach needed to heal more before they could train patient to care for it. Patient has plans to follow up with the dentist. Lelon Mast is unfortunately still unsure when patient would be discharged and plans on letting us know once she in fact knows.

## 2020-08-05 NOTE — Telephone Encounter
Lelon Mast called from [facility] radiology requesting to speak to Huntley Dec in regard to this patient, Lelon Mast can be reached at [phone].

## 2020-08-11 ENCOUNTER — Telehealth: Admit: 2020-08-11 | Payer: PRIVATE HEALTH INSURANCE | Attending: Hematology & Oncology

## 2020-08-11 ENCOUNTER — Encounter: Admit: 2020-08-11 | Payer: PRIVATE HEALTH INSURANCE | Attending: Hematology & Oncology

## 2020-08-11 NOTE — Telephone Encounter
**  spoke to [place] at radiation therapy**Gonzalez, Kirke Corin, MD  ? 08/11/20 11:23 AMHello, I can't get teeth pulled because I don't have the insurance or the money. I would like to proceed anyway or maybe there is an alternative. Please advise Thank you L[facility]

## 2020-08-12 ENCOUNTER — Encounter: Admit: 2020-08-12 | Payer: PRIVATE HEALTH INSURANCE | Attending: Hematology & Oncology

## 2020-08-12 ENCOUNTER — Encounter: Admit: 2020-08-12 | Payer: PRIVATE HEALTH INSURANCE

## 2020-08-12 ENCOUNTER — Encounter: Admit: 2020-08-12 | Payer: PRIVATE HEALTH INSURANCE | Attending: Vascular and Interventional Radiology

## 2020-08-21 ENCOUNTER — Telehealth: Admit: 2020-08-21 | Payer: PRIVATE HEALTH INSURANCE | Attending: Hematology & Oncology

## 2020-08-21 NOTE — Telephone Encounter
Darel Hong nurse from [facility] is calling to give update -states per his last visit he needed a discharge date to start his radiation. But because of his dental work being pushed back to 12/23 everything is pending. She is calling to give an update a call back isn't necessary but she can be reached [phone] ext [phone]

## 2020-09-01 ENCOUNTER — Telehealth: Admit: 2020-09-01 | Payer: PRIVATE HEALTH INSURANCE | Attending: Hematology & Oncology

## 2020-09-01 NOTE — Telephone Encounter
Spoke to [place] at radiation therapy regarding chemotherapy and radiation therapy start date. Theodoro Grist RNC to coordinate.

## 2020-09-02 ENCOUNTER — Ambulatory Visit: Admit: 2020-09-02 | Payer: PRIVATE HEALTH INSURANCE | Attending: Hematology & Oncology

## 2020-09-02 ENCOUNTER — Ambulatory Visit: Admit: 2020-09-02 | Payer: MEDICARE | Attending: Hematology & Oncology

## 2020-09-02 ENCOUNTER — Encounter: Admit: 2020-09-02 | Payer: PRIVATE HEALTH INSURANCE | Attending: Hematology & Oncology

## 2020-09-02 ENCOUNTER — Inpatient Hospital Stay: Admit: 2020-09-02 | Discharge: 2020-09-02 | Payer: MEDICARE

## 2020-09-02 DIAGNOSIS — C760 Malignant neoplasm of head, face and neck: Secondary | ICD-10-CM

## 2020-09-02 LAB — CBC WITH AUTO DIFFERENTIAL
BKR WAM ABSOLUTE IMMATURE GRANULOCYTES.: 0.09 x 1000/??L (ref 0.00–0.30)
BKR WAM ABSOLUTE LYMPHOCYTE COUNT.: 2.19 x 1000/??L (ref 0.60–3.70)
BKR WAM ABSOLUTE NRBC (2 DEC): 0 x 1000/??L (ref 0.00–1.00)
BKR WAM ANALYZER ANC: 6.8 x 1000/??L (ref 2.00–7.60)
BKR WAM BASOPHIL ABSOLUTE COUNT.: 0.06 x 1000/??L (ref 0.00–1.00)
BKR WAM BASOPHILS: 0.5 % — ABNORMAL LOW (ref 0.0–1.4)
BKR WAM EOSINOPHIL ABSOLUTE COUNT.: 0.58 x 1000/ÂµL (ref 0.00–1.00)
BKR WAM EOSINOPHILS: 5.3 % — ABNORMAL HIGH (ref 0.0–5.0)
BKR WAM HEMATOCRIT (2 DEC): 40.2 % (ref 38.50–50.00)
BKR WAM HEMOGLOBIN: 13 g/dL — ABNORMAL LOW (ref 13.2–17.1)
BKR WAM IMMATURE GRANULOCYTES: 0.8 % — ABNORMAL LOW (ref 0.0–1.0)
BKR WAM LYMPHOCYTES: 19.9 % (ref 17.0–50.0)
BKR WAM MCH (PG): 27.7 pg (ref 27.0–33.0)
BKR WAM MCHC: 32.3 g/dL (ref 31.0–36.0)
BKR WAM MCV: 85.7 fL (ref 80.0–100.0)
BKR WAM MONOCYTE ABSOLUTE COUNT.: 1.31 x 1000/ÂµL — ABNORMAL HIGH (ref 0.00–1.00)
BKR WAM MONOCYTES: 11.9 % (ref 4.0–12.0)
BKR WAM MPV: 9.2 fL (ref 8.0–12.0)
BKR WAM NEUTROPHILS: 61.6 % (ref 39.0–72.0)
BKR WAM NUCLEATED RED BLOOD CELLS: 0 % (ref 0.0–1.0)
BKR WAM PLATELETS: 151 x1000/??L (ref 150–420)
BKR WAM RDW-CV: 13 % (ref 11.0–15.0)
BKR WAM RED BLOOD CELL COUNT.: 4.69 M/??L (ref 4.00–6.00)
BKR WAM WHITE BLOOD CELL COUNT: 11 x1000/??L (ref 4.0–11.0)

## 2020-09-02 LAB — COMPREHENSIVE METABOLIC PANEL
BKR A/G RATIO: 1.5 (ref 1.0–2.2)
BKR ALANINE AMINOTRANSFERASE (ALT): 19 U/L (ref 9–59)
BKR ALBUMIN: 4.3 g/dL (ref 3.6–4.9)
BKR ALKALINE PHOSPHATASE: 104 U/L (ref 9–122)
BKR ANION GAP: 9 % — ABNORMAL HIGH (ref 7–17)
BKR ASPARTATE AMINOTRANSFERASE (AST): 13 U/L (ref 10–35)
BKR AST/ALT RATIO: 0.7
BKR BILIRUBIN TOTAL: 0.3 mg/dL (ref <=1.2)
BKR BLOOD UREA NITROGEN: 21 mg/dL — ABNORMAL HIGH (ref 6–20)
BKR BUN / CREAT RATIO: 13.9 % (ref 8.0–23.0)
BKR CALCIUM: 10.2 mg/dL (ref 8.8–10.2)
BKR CHLORIDE: 98 mmol/L (ref 98–107)
BKR CO2: 25 mmol/L (ref 20–30)
BKR CREATININE: 1.51 mg/dL — ABNORMAL HIGH (ref 0.40–1.30)
BKR EGFR (AFR AMER): 58 mL/min/{1.73_m2} (ref >60)
BKR EGFR (NON AFRICAN AMERICAN): 48 mL/min/{1.73_m2} (ref >60)
BKR GLOBULIN: 2.8 g/dL (ref 2.3–3.5)
BKR GLUCOSE: 237 mg/dL — ABNORMAL HIGH (ref 70–100)
BKR POTASSIUM: 5.3 mmol/L — ABNORMAL HIGH (ref 3.3–5.1)
BKR SODIUM: 132 mmol/L — ABNORMAL LOW (ref 136–144)
BKR WAM ABSOLUTE LYMPHOCYTE COUNT.: 0.3 mg/dL (ref <=1.2)
BKR WAM BASOPHIL ABSOLUTE COUNT.: 13 U/L (ref 10–35)

## 2020-09-02 LAB — MAGNESIUM: BKR MAGNESIUM: 1.8 mg/dL (ref 1.7–2.4)

## 2020-09-02 NOTE — Other
ON PATHWAY REGIMEN - Head and NeckNo Change  Continue With Treatment as Ordered.Original Decision Date/Time: 07/14/2020 20:00    Cisplatin (Platinol) **Always confirm dose/schedule in your pharmacy ordering system**Patient Characteristics:Larynx, Preoperative or Nonsurgical Candidate, Stage III - IVBDisease Classification: LarynxAJCC T Category: T3AJCC 8 Stage Grouping: IIITherapeutic Status: Preoperative or Nonsurgical CandidateAJCC N Category: cN0AJCC M Category: M0Intent of Therapy:Curative Intent, Discussed with Patient

## 2020-09-03 NOTE — Progress Notes
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 He is feeling generally well. The Port-A-Cath has been inserted. He is scheduled for G tube insertion. He has had mouth extraction. Treatment is scheduled to begin 09/14/2020.       Review of Systems   Constitutional: Negative for activity change, appetite change, chills, fatigue, fever and unexpected weight change.   HENT: Positive for voice change. Negative for congestion, ear pain, facial swelling and nosebleeds.         Left neck pain.  Tracheostomy   Eyes: Negative for discharge, redness and itching.        MILD GLAUCOMA, RETINOPATHY   Respiratory: Negative for cough, chest tightness, shortness of breath and wheezing.    Cardiovascular: Negative for chest pain, palpitations and leg swelling.   Gastrointestinal: Negative for abdominal distention, abdominal pain, anal bleeding, blood in stool, constipation, diarrhea, nausea and vomiting.        DECLINES COLONOSCOPY  Stable dysphagia.   Endocrine: Negative for cold intolerance and heat intolerance.   Genitourinary: Negative for difficulty urinating.   Musculoskeletal: Negative for neck pain.   Allergic/Immunologic: Negative for environmental allergies.   Neurological: Negative for dizziness, syncope, speech difficulty, weakness, numbness and headaches.        NO NEUROPATHY   Hematological: Positive for adenopathy. Does not bruise/bleed easily.        Left cervical adenopathy   Psychiatric/Behavioral: Negative for agitation and confusion. The patient is not nervous/anxious.      A comprehensive 12-point review of systems was queried and is otherwise negative.    PAST MEDICAL HISTORY: Arthur [name] has a past medical history of Diabetes mellitus (HC Code) (HC CODE), Hypercholesteremia, and Hypertension.    PAST SURGICAL HISTORY: He has no past surgical history on file.    ALLERGIES: Patient has no known allergies.     MEDICATIONS:   ?  atorvastatin, TAKE 1 TABLET BY MOUTH ONCE DAILY  ?  fexofenadine,   ?  insulin glargine,hum.rec.anlog (TOUJEO SOLOSTAR U-300 INSULIN SUBQ), Inject under the skin.  ?  Jardiance,   ?  lisinopriL, TAKE 1 TABLET BY MOUTH ONCE DAILY  ?  metFORMIN, Take 1 tablet by mouth twice daily with food  ?  omeprazole,   ?  Toujeo Max U-300 SoloStar,   ?  traZODone, 50 mg,  Oral, Nightly  SOCIAL HISTORY: Arthur [name]  reports that he quit smoking about 7 years ago. He has a 30.00 pack-year smoking history. He does not have any smokeless tobacco history on file. He reports current alcohol use of about 14.0 standard drinks of alcohol per week. No history on file for drug use.    FAMILY HISTORY: His family history includes Aneurysm in his mother.    PHYSICAL EXAM:  Vitals:    09/02/20 1129   BP: 110/76   Pulse: (!) 107   Resp: 20   Temp: (!) 96.3 ?F (35.7 ?C)       Physical Exam  Constitutional:       General: He is not in acute distress.     Appearance: He is well-developed. He is not ill-appearing.   HENT:      Head: Normocephalic and atraumatic.     Eyes:      Conjunctiva/sclera: Conjunctivae normal.   Neck:      Thyroid: No thyromegaly.   Cardiovascular:      Rate and Rhythm: Normal rate and regular rhythm.      Heart sounds: No murmur heard.     Pulmonary:      Breath sounds: Normal breath sounds. No wheezing or rales.   Chest:      Breasts:         Right: No mass.         Left: No mass.       Abdominal:      General: Bowel sounds are normal. There is no distension.      Palpations: Abdomen is soft. There is no mass.      Tenderness: There is no abdominal tenderness.   Musculoskeletal:         General: Normal range of motion.      Cervical back: Neck supple.   Lymphadenopathy:      Cervical: No cervical adenopathy.      Upper Body:      Right upper body: No supraclavicular adenopathy.      Left upper body: No supraclavicular adenopathy.   Skin:     General: Skin is warm and dry.      Findings: No ecchymosis, erythema, petechiae or rash.   Neurological:      Mental Status: He is alert.      Sensory: No sensory deficit.           Data Review:    Lab Results   Component Value Date    WBC 11.0 09/02/2020    HGB 13.0 (L) 09/02/2020    HCT 40.20 09/02/2020    MCV 85.7 09/02/2020    PLT 151 09/02/2020       Chemistry    Lab Results   Component Value Date    NA 132 (L) 09/02/2020    K 5.3 (H) 09/02/2020    CL 98 09/02/2020    CO2 25 09/02/2020    BUN 21 (H) 09/02/2020    CREATININE 1.51 (H) 09/02/2020    GLU 237 (H) 09/02/2020    Lab Results   Component Value Date    CALCIUM 10.2 09/02/2020    ALKPHOS 104 09/02/2020    AST 13 09/02/2020    ALT 19 09/02/2020    BILITOT 0.3 09/02/2020            Component      Latest Ref Rng & Units 06/16/2020   Immunofixation Electrophoresis Gel       Normal immunofixation electrophoresis.  No evidence of a serum monoclonal component.         Component      Latest Ref Rng & Units 06/16/2020 12/16/2019 05/31/2019 03/05/2019   Immunoglobulin G      700-1,600 mg/dL 161 (L) 096 (L) 045 (L) 542 (L) Immunoglobulin A      70 - 470 mg/dL 11 (L) 11 (L) 10 (L) 11 (L)   IgM      40 - 230 mg/dL 38 (L) 23 (L) 27 (L) 23 (L)   Kappa Free Light Chains, Serum      0.33 - 1.94 mg/dL 4.09 8.11 9.14 7.82   Lambda Free Light Chains, Serum      0.57 - 2.63 mg/dL 9.56 2.13 0.86 5.78   Free Kappa/Lambda Ratio      0.26 - 1.65 0.98 1.01 1.20 1.45   Beta-2 Microglobulin      0.80 - 2.20 mg/L 3.28 (H) 2.46 (H) 2.82 (H) 2.59 (H)       07/01/2020 PET/[place]  Head/Neck: Hypermetabolic mass, left vocal cord 2.4 x 1.3 cm SUV 8.3, extends to the midline.   There may be erosion of the thyroid cartilage. Vocal cords are nearly fully opposed.   Hypermetabolic right sub-mandibular node 9 mm SUV 5.0.  Left sub-mental node 9 mm SUV 1.3.  Chest: Right subclavian lymph node 9 mm SUV 2.9.  Axillary lymph nodes: Bilateral, non enlarged, right 12 mm SUV 3.6.  Abdomen/Pelvis: Non-enlarged inguinal lymph nodes, left 9 mm SUV 11.4.  Pelvis: Osseous uptake, left anterior iliac crest.        IMPRESSION and PLAN:   Laryngeal cancer: He has locally advanced laryngeal cancer. He required tracheostomy for incipient respiratory obstruction. He has nearly completed the rest of his pre-treatment evaluation. Due to his history of renal dysfunction and prior chemotherapy, he will receive weekly Cisplatin, rather than Q 3 weeks, in conjunction with radiation. He has previously signed informed consent.    PLAN:  1. G Tube.  2. Nurse teaching.  3. Return 09/14/2020 to begin combined modality therapy.          Scribed for Gevena Mart, MD by Beatrix Fetters, medical scribe September 03, 2020    The documentation recorded by the scribe accurately reflects the services I personally performed and the decisions made by me. I reviewed and confirmed all material entered and/or pre-charted by the scribe.

## 2020-09-08 ENCOUNTER — Encounter: Admit: 2020-09-08 | Payer: PRIVATE HEALTH INSURANCE

## 2020-09-09 ENCOUNTER — Ambulatory Visit: Admit: 2020-09-09 | Payer: PRIVATE HEALTH INSURANCE

## 2020-09-09 ENCOUNTER — Encounter: Admit: 2020-09-09 | Payer: PRIVATE HEALTH INSURANCE

## 2020-09-11 ENCOUNTER — Encounter: Admit: 2020-09-11 | Payer: PRIVATE HEALTH INSURANCE | Attending: Hematology & Oncology

## 2020-09-11 ENCOUNTER — Encounter: Admit: 2020-09-11 | Payer: PRIVATE HEALTH INSURANCE

## 2020-09-11 DIAGNOSIS — C760 Malignant neoplasm of head, face and neck: Secondary | ICD-10-CM

## 2020-09-14 ENCOUNTER — Encounter: Admit: 2020-09-14 | Payer: PRIVATE HEALTH INSURANCE | Attending: Hematology & Oncology

## 2020-09-14 ENCOUNTER — Ambulatory Visit: Admit: 2020-09-14 | Payer: PRIVATE HEALTH INSURANCE

## 2020-09-14 ENCOUNTER — Telehealth: Admit: 2020-09-14 | Payer: PRIVATE HEALTH INSURANCE | Attending: Hematology & Oncology

## 2020-09-14 ENCOUNTER — Encounter: Admit: 2020-09-14 | Payer: PRIVATE HEALTH INSURANCE

## 2020-09-14 ENCOUNTER — Inpatient Hospital Stay: Admit: 2020-09-14 | Discharge: 2020-09-14 | Payer: MEDICARE

## 2020-09-14 ENCOUNTER — Ambulatory Visit: Admit: 2020-09-14 | Payer: MEDICARE | Attending: Hematology & Oncology

## 2020-09-14 DIAGNOSIS — E7800 Pure hypercholesterolemia, unspecified: Secondary | ICD-10-CM

## 2020-09-14 DIAGNOSIS — Z5111 Encounter for antineoplastic chemotherapy: Secondary | ICD-10-CM

## 2020-09-14 DIAGNOSIS — Z931 Gastrostomy status: Secondary | ICD-10-CM

## 2020-09-14 DIAGNOSIS — E119 Type 2 diabetes mellitus without complications: Secondary | ICD-10-CM

## 2020-09-14 DIAGNOSIS — Z794 Long term (current) use of insulin: Secondary | ICD-10-CM

## 2020-09-14 DIAGNOSIS — Z79899 Other long term (current) drug therapy: Secondary | ICD-10-CM

## 2020-09-14 DIAGNOSIS — C760 Malignant neoplasm of head, face and neck: Secondary | ICD-10-CM

## 2020-09-14 DIAGNOSIS — I10 Essential (primary) hypertension: Secondary | ICD-10-CM

## 2020-09-14 DIAGNOSIS — Z87891 Personal history of nicotine dependence: Secondary | ICD-10-CM

## 2020-09-14 DIAGNOSIS — C880 Waldenstrom macroglobulinemia: Secondary | ICD-10-CM

## 2020-09-14 DIAGNOSIS — C329 Malignant neoplasm of larynx, unspecified: Secondary | ICD-10-CM

## 2020-09-14 LAB — COMPREHENSIVE METABOLIC PANEL
BKR A/G RATIO: 1.5 (ref 1.0–2.2)
BKR ALANINE AMINOTRANSFERASE (ALT): 26 U/L (ref 9–59)
BKR ALBUMIN: 3.9 g/dL (ref 3.6–4.9)
BKR ANION GAP: 12 % (ref 7–17)
BKR ASPARTATE AMINOTRANSFERASE (AST): 13 U/L — ABNORMAL HIGH (ref 10–35)
BKR AST/ALT RATIO: 0.5 x 1000/??L (ref 0.00–1.00)
BKR BILIRUBIN TOTAL: 0.3 mg/dL (ref <=1.2)
BKR BLOOD UREA NITROGEN: 22 mg/dL — ABNORMAL HIGH (ref 6–20)
BKR BUN / CREAT RATIO: 15 (ref 8.0–23.0)
BKR CALCIUM: 11.2 mg/dL — ABNORMAL HIGH (ref 8.8–10.2)
BKR CHLORIDE: 91 mmol/L — ABNORMAL LOW (ref 98–107)
BKR CO2: 27 mmol/L (ref 20–30)
BKR CREATININE: 1.47 mg/dL — ABNORMAL HIGH (ref 0.40–1.30)
BKR EGFR (AFR AMER): 60 mL/min/{1.73_m2} (ref >60)
BKR EGFR (NON AFRICAN AMERICAN): 50 mL/min/{1.73_m2} (ref >60)
BKR GLOBULIN: 2.6 g/dL (ref 2.3–3.5)
BKR GLUCOSE: 157 mg/dL — ABNORMAL HIGH (ref 70–100)
BKR POTASSIUM: 5.1 mmol/L (ref 3.3–5.1)
BKR PROTEIN TOTAL: 6.5 g/dL — ABNORMAL LOW (ref 6.6–8.7)
BKR SODIUM: 130 mmol/L — ABNORMAL LOW (ref 136–144)

## 2020-09-14 LAB — CBC WITH AUTO DIFFERENTIAL
BKR ALKALINE PHOSPHATASE: 0.9 % (ref 0.0–1.0)
BKR WAM ABSOLUTE IMMATURE GRANULOCYTES.: 0.11 x 1000/??L (ref 0.00–0.30)
BKR WAM ABSOLUTE LYMPHOCYTE COUNT.: 1.83 x 1000/ÂµL (ref 0.60–3.70)
BKR WAM ABSOLUTE NRBC (2 DEC): 0 x 1000/??L (ref 0.00–1.00)
BKR WAM ANALYZER ANC: 8.59 x 1000/??L — ABNORMAL HIGH (ref 2.00–7.60)
BKR WAM BASOPHIL ABSOLUTE COUNT.: 0.04 x 1000/ÂµL (ref 0.00–1.00)
BKR WAM BASOPHILS: 0.3 % (ref 0.0–1.4)
BKR WAM EOSINOPHIL ABSOLUTE COUNT.: 0.5 x 1000/ÂµL (ref 0.00–1.00)
BKR WAM EOSINOPHILS: 4.1 % (ref 0.0–5.0)
BKR WAM HEMATOCRIT (2 DEC): 34.1 % — ABNORMAL LOW (ref 38.50–50.00)
BKR WAM HEMOGLOBIN: 11.3 g/dL — ABNORMAL LOW (ref 13.2–17.1)
BKR WAM IMMATURE GRANULOCYTES: 0.9 % (ref 0.0–1.0)
BKR WAM LYMPHOCYTES: 15 % — ABNORMAL LOW (ref 17.0–50.0)
BKR WAM MCH (PG): 28 pg (ref 27.0–33.0)
BKR WAM MCHC: 33.1 g/dL (ref 31.0–36.0)
BKR WAM MCV: 84.6 fL — ABNORMAL LOW (ref 80.0–100.0)
BKR WAM MONOCYTE ABSOLUTE COUNT.: 1.12 x 1000/??L — ABNORMAL HIGH (ref 0.00–1.00)
BKR WAM MONOCYTES: 9.2 % (ref 4.0–12.0)
BKR WAM MPV: 9 fL (ref 8.0–12.0)
BKR WAM NEUTROPHILS: 70.5 % (ref 39.0–72.0)
BKR WAM NUCLEATED RED BLOOD CELLS: 0 % (ref 0.0–1.0)
BKR WAM PLATELETS: 183 x1000/ÂµL (ref 150–420)
BKR WAM RDW-CV: 12.9 % (ref 11.0–15.0)
BKR WAM RED BLOOD CELL COUNT.: 4.03 M/??L (ref 4.00–6.00)
BKR WAM WHITE BLOOD CELL COUNT: 12.2 x1000/??L — ABNORMAL HIGH (ref 4.0–11.0)

## 2020-09-14 LAB — MAGNESIUM: BKR MAGNESIUM: 1.8 mg/dL (ref 1.7–2.4)

## 2020-09-14 MED ORDER — DIPHENHYDRAMINE 50 MG/ML INJECTION SOLUTION
50 mg/mL | Freq: Once | INTRAVENOUS | Status: DC | PRN
Start: 2020-09-14 — End: 2020-09-15

## 2020-09-14 MED ORDER — SODIUM CHLORIDE 0.9 % INTRAVENOUS SOLUTION
INTRAVENOUS | Status: DC
Start: 2020-09-14 — End: 2020-09-15

## 2020-09-14 MED ORDER — HYDROCORTISONE SODIUM SUCCINATE 100 MG SOLUTION FOR INJECTION
100 mg | Freq: Once | INTRAVENOUS | Status: DC | PRN
Start: 2020-09-14 — End: 2020-09-15

## 2020-09-14 MED ORDER — PALONOSETRON 0.25 MG/5 ML INTRAVENOUS SOLUTION
0.25 mg/5 mL | Freq: Once | INTRAVENOUS | Status: CP
Start: 2020-09-14 — End: ?
  Administered 2020-09-14: 17:00:00 0.25 mL via INTRAVENOUS

## 2020-09-14 MED ORDER — SODIUM CHLORIDE 0.9 % INTRAVENOUS SOLUTION
Freq: Once | INTRAVENOUS | Status: CP
Start: 2020-09-14 — End: ?
  Administered 2020-09-14: 16:00:00 via INTRAVENOUS

## 2020-09-14 MED ORDER — HEPARIN, PORCINE (PF) 100 UNIT/ML IN 0.9% SODIUM CHLORIDE IV SYRINGE
100 unit/mL | Status: DC | PRN
Start: 2020-09-14 — End: 2020-09-15
  Administered 2020-09-14: 20:00:00 100 mL

## 2020-09-14 MED ORDER — FAMOTIDINE 4 MG/ML IN 0.9% SODIUM CHLORIDE (ADULT)
Freq: Once | INTRAVENOUS | Status: DC | PRN
Start: 2020-09-14 — End: 2020-09-15

## 2020-09-14 MED ORDER — ALBUTEROL SULFATE 2.5 MG/3 ML (0.083 %) SOLUTION FOR NEBULIZATION
2.5 mg /3 mL (0.083 %) | RESPIRATORY_TRACT | Status: DC | PRN
Start: 2020-09-14 — End: 2020-09-15

## 2020-09-14 MED ORDER — SODIUM CHLORIDE 0.9 % INTRAVENOUS SOLUTION
INTRAVENOUS | Status: DC | PRN
Start: 2020-09-14 — End: 2020-09-15

## 2020-09-14 MED ORDER — DEXAMETHASONE SODIUM PHOSPHATE (PF) 10 MG/ML INJECTION SOLUTION
10 mg/mL | Freq: Once | INTRAVENOUS | Status: CP
Start: 2020-09-14 — End: ?
  Administered 2020-09-14: 17:00:00 10 mL via INTRAVENOUS

## 2020-09-14 MED ORDER — CISPLATIN INFUSION
Freq: Once | INTRAVENOUS | Status: CP
Start: 2020-09-14 — End: ?
  Administered 2020-09-14: 18:00:00 500.000 mL/h via INTRAVENOUS

## 2020-09-14 MED ORDER — SODIUM CHLORIDE 0.9 % BOLUS (NEW BAG)
0.9 % | Freq: Once | INTRAVENOUS | Status: DC | PRN
Start: 2020-09-14 — End: 2020-09-15

## 2020-09-14 MED ORDER — APREPITANT 7.2 MG/ML INTRAVENOUS EMULSION
7.2 mg/mL | Freq: Once | INTRAVENOUS | Status: CP
Start: 2020-09-14 — End: ?
  Administered 2020-09-14: 17:00:00 7.2 mL via INTRAVENOUS

## 2020-09-14 MED ORDER — SODIUM CHLORIDE 0.9 % (FLUSH) INJECTION SYRINGE
0.9 % | Status: DC | PRN
Start: 2020-09-14 — End: 2020-09-15

## 2020-09-14 MED ORDER — EPINEPHRINE 0.3 MG/0.3 ML INJECTION, AUTO-INJECTOR
0.3 mg/ mL | INTRAMUSCULAR | Status: DC | PRN
Start: 2020-09-14 — End: 2020-09-15

## 2020-09-14 MED ORDER — MEPERIDINE 25 MG/2.5 ML IN 0.9% SODIUM CHLORIDE
Freq: Once | INTRAVENOUS | Status: DC | PRN
Start: 2020-09-14 — End: 2020-09-15

## 2020-09-14 MED ORDER — LORAZEPAM 0.5 MG TABLET
0.5 mg | Freq: Once | ORAL | Status: CP
Start: 2020-09-14 — End: ?
  Administered 2020-09-14: 19:00:00 0.5 mg via ORAL

## 2020-09-14 NOTE — Other
PHARMACIST: CHEMOTHERAPY NOTEHematology/Oncology Diagnosis: laryngeal cancerOncologist: Dr SabbathOncology Treatment History: New diagnosisPast Medical History: Past Medical History: Diagnosis Date ? Diabetes mellitus (HC Code) (HC CODE)  ? Hypercholesteremia  ? Hypertension  Current Treatment Protocol/Regimen Name: Cycle: 1Treatment plan medications:  Cisplatin 40 mg/m2 weekly with radiationSupportive Care:?	Emesis prophylaxis: Aloxi, Cinvanti?	Hypersensitivity prophylaxis: dexamethasone 10 mg ivSigned consent: scanned into epicPrecertification: documented in referrals tabPatient Description: 55 y.o. maleWeight:137.8kg 	  Height: 6 0BSA: 2.66 m2BMI: 40.26 m2Comments regarding dose calculations: Actual body weight used for calculations.Monitoring ParametersCBC and CMP reviewedCBC:Recent Results (from the past 8736 hour(s)) CBC auto differential  Collection Time: 09/14/20  9:48 AM Result Value Ref Range  WBC 12.2 (H) 4.0 - 11.0 x1000/?L  RBC 4.03 4.00 - 6.00 M/?L  Hemoglobin 11.3 (L) 13.2 - 17.1 g/dL  Hematocrit 16.10 (L) 96.04 - 50.00 %  MCV 84.6 80.0 - 100.0 fL  MCH 28.0 27.0 - 33.0 pg  MCHC 33.1 31.0 - 36.0 g/dL  RDW-CV 54.0 98.1 - 19.1 %  Platelets 183 150 - 420 x1000/?L  MPV 9.0 8.0 - 12.0 fL  Neutrophils 70.5 39.0 - 72.0 %  Lymphocytes 15.0 (L) 17.0 - 50.0 %  Monocytes 9.2 4.0 - 12.0 %  Eosinophils 4.1 0.0 - 5.0 %  Basophil 0.3 0.0 - 1.4 %  Immature Granulocytes 0.9 0.0 - 1.0 %  nRBC 0.0 0.0 - 1.0 %  ANC(Abs Neutrophil Count) 8.59 (H) 2.00 - 7.60 x 1000/?L  Absolute Lymphocyte Count 1.83 0.60 - 3.70 x 1000/?L  Monocyte Absolute Count 1.12 (H) 0.00 - 1.00 x 1000/?L  Eosinophil Absolute Count 0.50 0.00 - 1.00 x 1000/?L  Basophil Absolute Count 0.04 0.00 - 1.00 x 1000/?L  Absolute Immature Granulocyte Count 0.11 0.00 - 0.30 x 1000/?L  Absolute nRBC 0.00 0.00 - 1.00 x 1000/?L [id number] Results (from the past 8736 hour(s)) Comprehensive metabolic panel  Collection Time: 09/14/20  9:48 AM Result Value Ref Range  Sodium 130 (L) 136 - 144 mmol/L  Potassium 5.1 3.3 - 5.1 mmol/L  Chloride 91 (L) 98 - 107 mmol/L  CO2 27 20 - 30 mmol/L  Anion Gap 12 7 - 17  Glucose 157 (H) 70 - 100 mg/dL  BUN 22 (H) 6 - 20 mg/dL  Creatinine 3.08 (H) 6.57 - 1.30 mg/dL  Calcium 84.6 (H) 8.8 - 10.2 mg/dL  BUN/Creatinine Ratio 96.2 8.0 - 23.0  Total Protein 6.5 (L) 6.6 - 8.7 g/dL  Albumin 3.9 3.6 - 4.9 g/dL  Total Bilirubin 0.3 <=9.5 mg/dL  Alkaline Phosphatase 78 9 - 122 U/L  Alanine Aminotransferase (ALT) 26 9 - 59 U/L  Aspartate Aminotransferase (AST) 13 10 - 35 U/L  Globulin 2.6 2.3 - 3.5 g/dL  A/G Ratio 1.5 1.0 - 2.2  AST/ALT Ratio 0.5 See Comment  eGFR (Afr Amer) 60 >60 mL/min/1.38m2  eGFR (NON African-American) 50 >60 mL/min/1.71m2 LDH: No results found for this or any previous visit (from the past 8736 hour(s)).Scr 1.47Mg : 1.8  Medications:Current Outpatient Medications on File Prior to Visit Medication Sig Dispense Refill ? atorvastatin (LIPITOR) 20 mg tablet TAKE 1 TABLET BY MOUTH ONCE DAILY   ? fexofenadine (ALLEGRA) 60 mg tablet    ? insulin glargine,hum.rec.anlog (TOUJEO SOLOSTAR U-300 INSULIN SUBQ) Inject under the skin.   ? JARDIANCE 10 mg tablet    ? lisinopriL (PRINIVIL,ZESTRIL) 10 mg tablet TAKE 1 TABLET BY MOUTH ONCE DAILY   ? metFORMIN (GLUCOPHAGE) 1000 mg tablet Take 1 tablet by mouth twice daily with food   ? omeprazole (PRILOSEC) 40 mg capsule    ?  TOUJEO MAX U-300 SOLOSTAR 300 unit/mL (3 mL) pen    ? traZODone (DESYREL) 50 mg tablet Take 1 tablet (50 mg total) by mouth nightly. 30 tablet 0 Current Facility-Administered Medications on File Prior to Visit Medication Dose Route Frequency Provider Last Rate Last Admin ? [START ON 09/15/2020] sodium chloride 0.9% infusion  500 mL/hr Intravenous Continuous Harlen Labs, MD     Drug interaction assessment: Treatment plan and current medication list evaluated for drug-drug interactions. None exist.Chemotherapy note completed [id number] Signed by Lia Hopping, PharmD, September 14, 2020

## 2020-09-14 NOTE — Progress Notes
Patient seen by Dr Daphene Jaeger today to begin concurrent treatment with Cisplatin/XRT.  Patient currently resides at [facility] but was living alone prior and plans to return home soon.  Patient unable to come for teaching appointment so via oncology handout and verbal information re: treatment provided.  Orders for oxycodone and miralax sent to [facility]; patient aware.  Patient states extra strength tylenol dose help but not completely.  Patient educated on being sure to not let pain get out of control and to use the oxycodone if needed.  Treatment administered as ordered.  Patient tolerated infusion without incident.  Patient put out only 275 cc clear amber urine.  Patient to return tomorrow for additional IVFs.  Patient aware.  Port capped and left for use tomorrow.  Patient discharged via wheelchair to radiation therapy department.

## 2020-09-14 NOTE — Telephone Encounter
RC to [facility].  LMVM stating as far as I knew patient was unable to get a bed in [facility] therefore he will be treated as planned as outpatient.

## 2020-09-14 NOTE — Telephone Encounter
Lupita Leash from [facility] called regarding patient's chemo treatment and to clarify why patient ended up back with them when he was suppose to be getting a higher level of care through [facility]. Lupita Leash can be reach at [phone]

## 2020-09-15 ENCOUNTER — Encounter: Admit: 2020-09-15 | Payer: PRIVATE HEALTH INSURANCE

## 2020-09-15 ENCOUNTER — Ambulatory Visit: Admit: 2020-09-15 | Payer: MEDICARE

## 2020-09-15 DIAGNOSIS — I10 Essential (primary) hypertension: Secondary | ICD-10-CM

## 2020-09-15 DIAGNOSIS — C760 Malignant neoplasm of head, face and neck: Secondary | ICD-10-CM

## 2020-09-15 DIAGNOSIS — E7800 Pure hypercholesterolemia, unspecified: Secondary | ICD-10-CM

## 2020-09-15 DIAGNOSIS — E119 Type 2 diabetes mellitus without complications: Secondary | ICD-10-CM

## 2020-09-15 MED ORDER — SODIUM CHLORIDE 0.9 % INTRAVENOUS SOLUTION
Freq: Once | INTRAVENOUS | Status: CP
Start: 2020-09-15 — End: ?
  Administered 2020-09-15: 21:00:00 via INTRAVENOUS

## 2020-09-15 NOTE — Progress Notes
Pt arrived for IVF's. RT SLP remained accessed with + blood return noted. Pt states he has no adverse side effects of treatment received on 09/14/2020. Pt did have Radiation today. Nausea is denied and pt states he tolerates oral intake. Pt tolerated 1 liter of NS without any issues. Pt did have some bleeding from trach site which he states is normal. O2 maintained at all times and humidity will be applied when he returns to facility. Reinforced to call office with any issues or symptom concerns. Pt will return on 09/22/2020. Pt will continue with daily RT.

## 2020-09-15 NOTE — Progress Notes
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 He was seen by ENT and noted to have increased tissue fullness in the tracheostomy. The trach was revised. A G-tube has been placed. He complains of generalized pain. Appetite is decreased. He is able to swallow pills. The patient has been losing weight. He notes significant depression.        Review of Systems   Constitutional: Negative for activity change, appetite change, chills, fatigue, fever and unexpected weight change.   HENT: Positive for sore throat and voice change. Negative for congestion, ear pain, facial swelling and nosebleeds.         Left neck pain.  Tracheostomy   Eyes: Negative for discharge, redness and itching.        MILD GLAUCOMA, RETINOPATHY   Respiratory: Negative for cough, chest tightness, shortness of breath and wheezing.    Cardiovascular: Negative for chest pain, palpitations and leg swelling.   Gastrointestinal: Negative for abdominal distention, abdominal pain, anal bleeding, blood in stool, constipation, diarrhea, nausea and vomiting.        Increased constipation.   Endocrine: Negative for cold intolerance and heat intolerance.   Genitourinary: Negative for difficulty urinating.   Musculoskeletal: Negative for neck pain.   Allergic/Immunologic: Negative for environmental allergies.   Neurological: Negative for dizziness, syncope, speech difficulty, weakness, numbness and headaches.        NO NEUROPATHY   Hematological: Positive for adenopathy. Does not bruise/bleed easily.        Right cervical adenopathy.   Psychiatric/Behavioral: Negative for agitation and confusion. The patient is not nervous/anxious.      A comprehensive 12-point review of systems was queried and is otherwise negative.    PAST MEDICAL HISTORY: Arthur [name] has a past medical history of Diabetes mellitus (HC Code) (HC CODE), Hypercholesteremia, and Hypertension.    PAST SURGICAL HISTORY: He has no past surgical history on file.    ALLERGIES: Patient has no known allergies.     MEDICATIONS:   ?  atorvastatin, TAKE 1 TABLET BY MOUTH ONCE DAILY  ?  fexofenadine,   ?  insulin glargine,hum.rec.anlog (TOUJEO SOLOSTAR U-300 INSULIN SUBQ), Inject under the skin.  ?  Jardiance,   ?  lisinopriL, TAKE 1 TABLET BY MOUTH ONCE DAILY  ?  metFORMIN, Take 1 tablet by mouth twice daily with  food  ?  omeprazole,   ?  Toujeo Max U-300 SoloStar,   ?  traZODone, 50 mg, Oral, Nightly  ?  [START ON 09/15/2020] sodium chloride  SOCIAL HISTORY: Arthur [name]  reports that he quit smoking about 7 years ago. He has a 30.00 pack-year smoking history. He does not have any smokeless tobacco history on file. He reports current alcohol use of about 14.0 standard drinks of alcohol per week. No history on file for drug use.    FAMILY HISTORY: His family history includes Aneurysm in his mother.    PHYSICAL EXAM:  Vitals:    09/14/20 0851   BP: 120/60   Pulse: (!) 105   Resp: 20   Temp: 97.4 ?F (36.3 ?C)       Physical Exam  Constitutional:       General: He is not in acute distress.     Appearance: He is well-developed. He is not ill-appearing.      Comments: Ill-appearing. Moderate distress.   HENT:      Head: Normocephalic and atraumatic.     Eyes:      Conjunctiva/sclera: Conjunctivae normal.   Neck:      Thyroid: No thyromegaly.   Cardiovascular:      Rate and Rhythm: Normal rate and regular rhythm.      Heart sounds: No murmur heard.     Pulmonary:      Breath sounds: Normal breath sounds. No wheezing or rales.   Chest:      Breasts:         Right: No mass.         Left: No mass.       Abdominal:      General: Bowel sounds are normal. There is no distension.      Palpations: Abdomen is soft. There is no mass.      Tenderness: There is no abdominal tenderness.   Musculoskeletal:         General: Normal range of motion.      Cervical back: Neck supple.   Lymphadenopathy:      Cervical: No cervical adenopathy.      Upper Body:      Left upper body: No supraclavicular adenopathy.   Skin:     General: Skin is warm and dry.      Findings: No ecchymosis, erythema, petechiae or rash.   Neurological:      Mental Status: He is alert.      Sensory: No sensory deficit.           Data Review:    Lab Results   Component Value Date    WBC 12.2 (H) 09/14/2020    HGB 11.3 (L) 09/14/2020    HCT 34.10 (L) 09/14/2020    MCV 84.6 09/14/2020    PLT 183 09/14/2020       Chemistry    Lab Results   Component Value Date    NA 130 (L) 09/14/2020    K 5.1 09/14/2020    CL 91 (L) 09/14/2020    CO2 27 09/14/2020    BUN 22 (H) 09/14/2020    CREATININE 1.47 (H) 09/14/2020    GLU 157 (H) 09/14/2020    Lab Results   Component Value Date    CALCIUM 11.2 (H) 09/14/2020    ALKPHOS 78 09/14/2020    AST 13 09/14/2020    ALT 26 09/14/2020    BILITOT 0.3 09/14/2020  Component      Latest Ref Rng & Units 06/16/2020   Immunofixation Electrophoresis Gel       Normal immunofixation electrophoresis. No evidence of a serum monoclonal component. Component      Latest Ref Rng & Units 06/16/2020 12/16/2019 05/31/2019 03/05/2019   Immunoglobulin G      700-1,600 mg/dL 272 (L) 536 (L) 644 (L) 542 (L)   Immunoglobulin A      70 - 470 mg/dL 11 (L) 11 (L) 10 (L) 11 (L)   IgM      40 - 230 mg/dL 38 (L) 23 (L) 27 (L) 23 (L)   Kappa Free Light Chains, Serum      0.33 - 1.94 mg/dL 0.34 7.42 5.95 6.38   Lambda Free Light Chains, Serum      0.57 - 2.63 mg/dL 7.56 4.33 2.95 1.88   Free Kappa/Lambda Ratio      0.26 - 1.65 0.98 1.01 1.20 1.45   Beta-2 Microglobulin      0.80 - 2.20 mg/L 3.28 (H) 2.46 (H) 2.82 (H) 2.59 (H)       07/01/2020 PET/[place]  Head/Neck: Hypermetabolic mass, left vocal cord 2.4 x 1.3 cm SUV 8.3, extends to the midline.   There may be erosion of the thyroid cartilage. Vocal cords are nearly fully opposed.   Hypermetabolic right sub-mandibular node 9 mm SUV 5.0.  Left sub-mental node 9 mm SUV 1.3.  Chest: Right subclavian lymph node 9 mm SUV 2.9.  Axillary lymph nodes: Bilateral, non enlarged, right 12 mm SUV 3.6.  Abdomen/Pelvis: Non-enlarged inguinal lymph nodes, left 9 mm SUV 11.4.  Pelvis: Osseous uptake, left anterior iliac crest.      09/14/20      IMPRESSION and PLAN:   1. 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 There is tumor growing around the tracheostomy. This has been revised. He will start combined modality therapy, weekly Cisplatin with radiation. He has a right cervical lymph node, approximately 2 cm, that is new.    2. Pain: He has moderate pain. Oxycodone 5 mg will be prescribed.    3. Constipation: Miralax can be initiated.    4. Weight loss: He has significant weight loss. G-tube is in place. He will meet with the clinical nutritionist.     5.  Hypercalcemia-the calcium level is higher than previously noted.  This may represent his disease progression.  It will be repeated and if necessary medication will be prescribed    PLAN:  1. Cycle 1, Day 1.  2. Return 1 day for hydration on Day 2.          Scribed for Gevena Mart, MD by Beatrix Fetters, medical scribe September 14, 2020    The documentation recorded by the scribe accurately reflects the services I personally performed and the decisions made by me. I reviewed and confirmed all material entered and/or pre-charted by the scribe.

## 2020-09-17 ENCOUNTER — Telehealth: Admit: 2020-09-17 | Payer: PRIVATE HEALTH INSURANCE | Attending: Hematology & Oncology

## 2020-09-17 NOTE — Telephone Encounter
Pt schedule printed and fax to [facility] radiation [phone]

## 2020-09-22 ENCOUNTER — Encounter: Admit: 2020-09-22 | Payer: PRIVATE HEALTH INSURANCE | Attending: Hematology & Oncology

## 2020-09-22 ENCOUNTER — Ambulatory Visit: Admit: 2020-09-22 | Payer: MEDICARE | Attending: Medical Oncology

## 2020-09-22 ENCOUNTER — Inpatient Hospital Stay: Admit: 2020-09-22 | Discharge: 2020-09-22 | Payer: MEDICARE

## 2020-09-22 ENCOUNTER — Ambulatory Visit: Admit: 2020-09-22 | Payer: PRIVATE HEALTH INSURANCE | Attending: Hematology & Oncology

## 2020-09-22 ENCOUNTER — Ambulatory Visit: Admit: 2020-09-22 | Payer: PRIVATE HEALTH INSURANCE

## 2020-09-22 DIAGNOSIS — E7800 Pure hypercholesterolemia, unspecified: Secondary | ICD-10-CM

## 2020-09-22 DIAGNOSIS — Z87891 Personal history of nicotine dependence: Secondary | ICD-10-CM

## 2020-09-22 DIAGNOSIS — E119 Type 2 diabetes mellitus without complications: Secondary | ICD-10-CM

## 2020-09-22 DIAGNOSIS — Z930 Tracheostomy status: Secondary | ICD-10-CM

## 2020-09-22 DIAGNOSIS — Z515 Encounter for palliative care: Secondary | ICD-10-CM

## 2020-09-22 DIAGNOSIS — D6959 Other secondary thrombocytopenia: Secondary | ICD-10-CM

## 2020-09-22 DIAGNOSIS — C880 Waldenstrom macroglobulinemia: Secondary | ICD-10-CM

## 2020-09-22 DIAGNOSIS — C329 Malignant neoplasm of larynx, unspecified: Secondary | ICD-10-CM

## 2020-09-22 DIAGNOSIS — D693 Immune thrombocytopenic purpura: Secondary | ICD-10-CM

## 2020-09-22 DIAGNOSIS — R070 Pain in throat: Secondary | ICD-10-CM

## 2020-09-22 DIAGNOSIS — F32A Depression, unspecified: Secondary | ICD-10-CM

## 2020-09-22 DIAGNOSIS — Z794 Long term (current) use of insulin: Secondary | ICD-10-CM

## 2020-09-22 DIAGNOSIS — Z79899 Other long term (current) drug therapy: Secondary | ICD-10-CM

## 2020-09-22 DIAGNOSIS — I10 Essential (primary) hypertension: Secondary | ICD-10-CM

## 2020-09-22 DIAGNOSIS — C760 Malignant neoplasm of head, face and neck: Secondary | ICD-10-CM

## 2020-09-22 DIAGNOSIS — F419 Anxiety disorder, unspecified: Secondary | ICD-10-CM

## 2020-09-22 LAB — CBC WITH AUTO DIFFERENTIAL
BKR BILIRUBIN TOTAL: 1.8 % (ref 0.0–5.0)
BKR CHLORIDE: 30.9 % — ABNORMAL LOW (ref 38.50–50.00)
BKR WAM ABSOLUTE IMMATURE GRANULOCYTES.: 0.13 x 1000/??L (ref 0.00–0.30)
BKR WAM ABSOLUTE LYMPHOCYTE COUNT.: 0.67 x 1000/??L (ref 0.60–3.70)
BKR WAM ABSOLUTE NRBC (2 DEC): 0 x 1000/??L (ref 0.00–1.00)
BKR WAM ANALYZER ANC: 11.68 x 1000/??L — ABNORMAL HIGH (ref 2.00–7.60)
BKR WAM BASOPHIL ABSOLUTE COUNT.: 0.05 x 1000/??L (ref 0.00–1.00)
BKR WAM BASOPHILS: 0.4 % (ref 0.0–1.4)
BKR WAM EOSINOPHIL ABSOLUTE COUNT.: 0.26 x 1000/??L (ref 0.00–1.00)
BKR WAM EOSINOPHILS: 1.8 % (ref 0.0–5.0)
BKR WAM HEMATOCRIT (2 DEC): 30.9 % — ABNORMAL LOW (ref 38.50–50.00)
BKR WAM HEMOGLOBIN: 10.2 g/dL — ABNORMAL LOW (ref 13.2–17.1)
BKR WAM IMMATURE GRANULOCYTES: 0.9 % (ref 0.0–1.0)
BKR WAM LYMPHOCYTES: 4.7 % — ABNORMAL LOW (ref 17.0–50.0)
BKR WAM MCH (PG): 27.5 pg (ref 27.0–33.0)
BKR WAM MCHC: 33 g/dL — ABNORMAL HIGH (ref 31.0–36.0)
BKR WAM MCV: 83.3 fL (ref 80.0–100.0)
BKR WAM MONOCYTE ABSOLUTE COUNT.: 1.42 x 1000/ÂµL — ABNORMAL HIGH (ref 0.00–1.00)
BKR WAM MONOCYTES: 10 % — ABNORMAL LOW (ref 4.0–12.0)
BKR WAM MPV: 10.1 fL (ref 8.0–12.0)
BKR WAM NEUTROPHILS: 82.2 % — ABNORMAL HIGH (ref 39.0–72.0)
BKR WAM NUCLEATED RED BLOOD CELLS: 0 % (ref 0.0–1.0)
BKR WAM PLATELETS: 84 x1000/ÂµL — ABNORMAL LOW (ref 150–420)
BKR WAM RDW-CV: 13.4 % (ref 11.0–15.0)
BKR WAM RED BLOOD CELL COUNT.: 3.71 M/??L — ABNORMAL LOW (ref 4.00–6.00)
BKR WAM WHITE BLOOD CELL COUNT: 14.2 x1000/??L — ABNORMAL HIGH (ref 4.0–11.0)

## 2020-09-22 LAB — COMPREHENSIVE METABOLIC PANEL
BKR A/G RATIO: 1.3 (ref 1.0–2.2)
BKR ALANINE AMINOTRANSFERASE (ALT): 23 U/L (ref 9–59)
BKR ALBUMIN: 3.4 g/dL — ABNORMAL LOW (ref 3.6–4.9)
BKR ALKALINE PHOSPHATASE: 77 U/L (ref 9–122)
BKR ANION GAP: 13 (ref 7–17)
BKR ASPARTATE AMINOTRANSFERASE (AST): 13 U/L (ref 10–35)
BKR AST/ALT RATIO: 0.6 x 1000/??L — ABNORMAL HIGH (ref 0.00–1.00)
BKR BLOOD UREA NITROGEN: 48 mg/dL — ABNORMAL HIGH (ref 6–20)
BKR BUN / CREAT RATIO: 20 % — ABNORMAL HIGH (ref 8.0–23.0)
BKR CALCIUM: 10.4 mg/dL — ABNORMAL HIGH (ref 8.8–10.2)
BKR CO2: 26 mmol/L (ref 20–30)
BKR CREATININE: 2.4 mg/dL — ABNORMAL HIGH (ref 0.40–1.30)
BKR EGFR (AFR AMER): 34 mL/min/{1.73_m2} (ref >60)
BKR EGFR (NON AFRICAN AMERICAN): 28 mL/min/1.73m2 (ref >60)
BKR GLOBULIN: 2.6 g/dL — ABNORMAL HIGH (ref 2.3–3.5)
BKR GLUCOSE: 213 mg/dL — ABNORMAL HIGH (ref 70–100)
BKR POTASSIUM: 5.5 mmol/L — ABNORMAL HIGH (ref 3.3–5.1)
BKR PROTEIN TOTAL: 6 g/dL — ABNORMAL LOW (ref 6.6–8.7)
BKR SODIUM: 129 mmol/L — ABNORMAL LOW (ref 136–144)

## 2020-09-22 LAB — IMMATURE PLATELET FRACTION (BH GH YH)
BKR WAM IPF, ABSOLUTE: 2.4 x1000/??L (ref <20.0)
BKR WAM IPF: 2.8 % (ref 1.2–8.6)

## 2020-09-22 LAB — MAGNESIUM: BKR MAGNESIUM: 2 mg/dL (ref 1.7–2.4)

## 2020-09-22 MED ORDER — POLYETHYLENE GLYCOL 3350 17 GRAM ORAL POWDER PACKET
17 gram | Freq: Every day | ORAL | Status: SS
Start: 2020-09-22 — End: 2020-11-14

## 2020-09-22 MED ORDER — SODIUM CHLORIDE 0.9 % (FLUSH) INJECTION SYRINGE
0.9 % | Status: DC | PRN
Start: 2020-09-22 — End: 2020-09-22
  Administered 2020-09-22: 19:00:00 0.9 mL

## 2020-09-22 MED ORDER — FAMOTIDINE 20 MG TABLET
20 mg | Freq: Two times a day (BID) | ORAL | Status: AC
Start: 2020-09-22 — End: ?

## 2020-09-22 MED ORDER — HEPARIN, PORCINE (PF) 100 UNIT/ML IN 0.9% SODIUM CHLORIDE IV SYRINGE
100 unit/mL | Status: DC | PRN
Start: 2020-09-22 — End: 2020-09-22
  Administered 2020-09-22: 19:00:00 100 mL

## 2020-09-22 MED ORDER — ALBUTEROL SULFATE 2.5 MG/3 ML (0.083 %) SOLUTION FOR NEBULIZATION
2.530.083 mg /3 mL (0.083 %) | Freq: Every day | RESPIRATORY_TRACT | Status: AC
Start: 2020-09-22 — End: ?

## 2020-09-22 MED ORDER — INSULIN ASPART U-100  100 UNIT/ML SUBCUTANEOUS SOLUTION
100 unit/mL | Freq: Three times a day (TID) | SUBCUTANEOUS | Status: AC
Start: 2020-09-22 — End: ?

## 2020-09-22 MED ORDER — LORAZEPAM 0.5 MG TABLET
0.5 mg | Freq: Four times a day (QID) | ORAL | Status: AC | PRN
Start: 2020-09-22 — End: ?

## 2020-09-22 MED ORDER — OXYCODONE IMMEDIATE RELEASE 5 MG TABLET
5 mg | ORAL | Status: AC | PRN
Start: 2020-09-22 — End: ?

## 2020-09-22 MED ORDER — AZELASTINE 137 MCG (0.1 %) NASAL SPRAY AEROSOL
1370.1 mcg (0.1 %) | Freq: Three times a day (TID) | NASAL | Status: SS | PRN
Start: 2020-09-22 — End: 2020-11-14

## 2020-09-22 MED ORDER — ALBUTEROL SULFATE 2.5 MG/3 ML (0.083 %) SOLUTION FOR NEBULIZATION
2.530.083 mg /3 mL (0.083 %) | RESPIRATORY_TRACT | Status: DC | PRN
Start: 2020-09-22 — End: 2020-09-22
  Administered 2020-09-22: 19:00:00 2.5 mL via RESPIRATORY_TRACT

## 2020-09-22 MED ORDER — SODIUM CHLORIDE 0.9 % INTRAVENOUS SOLUTION
Freq: Once | INTRAVENOUS | Status: CP
Start: 2020-09-22 — End: ?
  Administered 2020-09-22: 17:00:00 via INTRAVENOUS

## 2020-09-22 MED ORDER — TRAZODONE 50 MG TABLET
50 mg | Freq: Every evening | ORAL | Status: SS
Start: 2020-09-22 — End: 2020-11-14

## 2020-09-22 NOTE — Progress Notes
Patient arrives for C1D8 Cisplatin/XRT.  Patient seen by Felicity Coyer, APRN today.  Labs reviewed.  PLTs down to 84K and Creatinine up to 2.4.  Due to renal function, patient to renal function treatment will be held this week per APRN/MD order.  Patient to be hydrated with 1L NS today and 1L NS tomorrow and return next week to re-evaulate and possibly resume treatment.  Neurontin added for pain.  Patient to return tomorrow as scheduled.  Port left accessed for use tomorrow.    Upon discharge, patient requesting breathing treatment for SOB.  Okay per APRN order.  Albuterol nebulizer administered with good effect.  Patient discharged back to [facility]

## 2020-09-23 ENCOUNTER — Ambulatory Visit: Admit: 2020-09-23 | Payer: MEDICARE

## 2020-09-23 ENCOUNTER — Telehealth: Admit: 2020-09-23 | Payer: PRIVATE HEALTH INSURANCE | Attending: Hematology & Oncology

## 2020-09-23 MED ORDER — ACETAMINOPHEN 325 MG TABLET
325 mg | Freq: Once | ORAL | Status: CP
Start: 2020-09-23 — End: ?
  Administered 2020-09-23: 21:00:00 325 mg via ORAL

## 2020-09-23 MED ORDER — SODIUM CHLORIDE 0.9 % INTRAVENOUS SOLUTION
Freq: Once | INTRAVENOUS | Status: CP
Start: 2020-09-23 — End: ?
  Administered 2020-09-23: 19:00:00 via INTRAVENOUS

## 2020-09-23 NOTE — Telephone Encounter
Radiation called for recent bloodwork (cbc) to be faxed [phone]

## 2020-09-23 NOTE — Progress Notes
[name] DOB: 15-Dec-1967MRN: [id number] Provider: Felicity Coyer, APRNDate of service: 1/18/2022FOLLOW-UP NOTEDIAGNOSIS:   ICD-10-CM 1. 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  Initial evaluation Spring 2021 was unremarkable.  Symptoms progressed and he underwent laryngoscopy 06/15/20 with finding of a mass in the left vocal cord, extending to the false vocal cords and arytenoid area.  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 07/15/2020 Notable Event  Admitted to [facility] for stridor.  Tracheostomy placed emergently. 09/14/2020 -  Chemotherapy  Weekly Cisplatin concurrent with radiation CURRENT THERAPY:Weekly Cisplatin concurrent with radiation Due for Cycle 1 Day 8 of Cisplatin todayINTERIM HISTORY: Mr. Arthur [name] is a 55 year old male who returns for follow-up of laryngeal cancer.  He is here today for Cycle 1 Day 8 of Cisplatin, which is being given concurrent with radiation.Dezmin notes that generalized pain (near his ears, back, head) responds well to Oxycodone, but he is frustrated that he does not always receive it quickly when he requests it and therefore pain sometimes becomes uncontrolled.He reports that he has increasing throat pain over the past week.  The majority of his nutrition comes from tube feeds, though.  He denies nausea or vomiting.He feels short of breath intermittently, often associated with episodes of anxiety, but it is difficult for him to ascertain if shortness of breath precipitates anxiety or if anxiety precipitates shortness of breath.  Ativan is helpful, though.His toes tingle after staying in one position, but then this resolves with movement.  He otherwise denies numbness or tingling in fingers or toes. Review of Systems Review of Systems Constitutional: Positive for malaise/fatigue and weight loss. Negative for chills and fever. HENT: Positive for sore throat. Negative for nosebleeds.  Eyes: Negative for blurred vision and double vision.      Glaucoma, retinopathy. Respiratory: Positive for sputum production and shortness of breath (with anxiety). Negative for cough and wheezing.  Cardiovascular: Negative for chest pain and palpitations. Gastrointestinal: Negative for abdominal pain, blood in stool, constipation, diarrhea, heartburn, melena, nausea and vomiting. Genitourinary: Negative for dysuria, frequency, hematuria and urgency. Musculoskeletal: Positive for back pain. Negative for joint pain. Skin: Negative for itching and rash. Neurological: Positive for headaches. Negative for dizziness and sensory change. Endo/Heme/Allergies: Does not bruise/bleed easily. Psychiatric/Behavioral: Positive for depression. The patient is nervous/anxious. The patient does not have insomnia.  A comprehensive 12-point review of systems was queried and is otherwise negative.REVIEW OF PAST MEDICAL,SURGICAL,SOCIAL,FAMILY HISTORY:Past Medical History: Diagnosis Date ? Diabetes mellitus (HC Code) (HC CODE)  ? Hypercholesteremia  ? Hypertension  No past surgical history on file. Socioeconomic History ? Marital status: Legally Separated Tobacco Use ? Smoking status: Former Smoker   Packs/day: 1.00   Years: 30.00   Pack years: 30.00   Quit date: 03/04/2013   Years since quitting: 7.5 Substance and Sexual Activity ? Alcohol use: Yes   Alcohol/week: 14.0 standard drinks   Types: 14 Cans of beer per week Family History Problem Relation Age of Onset ? Aneurysm Mother  ALLERGIES: No Known Allergies MEDICATIONS: Current Outpatient Medications Medication  Sig ? albuterol (PROVENTIL, VENTOLIN) 2.5 mg /3 mL (0.083 %) nebulizer solution Take 3 mLs by nebulization daily. ? atorvastatin (LIPITOR) 20 mg tablet TAKE 1 TABLET BY MOUTH ONCE DAILY ? azelastine (ASTELIN) 137 mcg (0.1 %) nasal spray Use 2 sprays in each nostril every 8 (eight) hours as needed for rhinitis. Use in each nostril as directed ? famotidine (PEPCID) 20 mg tablet Take 20 mg by mouth 2 (two) times daily. ? insulin aspart U-100 (NOVOLOG) 100 unit/mL vial Inject under the skin 3 (three) times daily before meals. 8 units before breakfast and lunch, 14 units before dinner ? insulin glargine,hum.rec.anlog (TOUJEO SOLOSTAR U-300 INSULIN SUBQ) Inject 62 Units under the skin daily.  ? lisinopriL (PRINIVIL,ZESTRIL) 10 mg tablet TAKE 1 TABLET BY MOUTH ONCE DAILY ? LORazepam (ATIVAN) 0.5 mg tablet Take 0.5 mg by mouth every 6 (six) hours as needed for anxiety. ? metFORMIN (GLUCOPHAGE) 1000 mg tablet Take 1,000 mg by mouth daily.  ? oxyCODONE (ROXICODONE) 5 mg Immediate Release tablet Take 5 mg by mouth every 4 (four) hours as needed. ? polyethylene glycol (MIRALAX) 17 gram packet Take 17 g by mouth daily. Mix in 8 ounces of water, juice, soda, coffee or tea prior to taking. ? traZODone (DESYREL) 50 mg tablet Take 25 mg by mouth nightly. No current facility-administered medications for this visit. PHYSICAL EXAM:Vitals:  09/22/20 1122 BP: 101/69 Site: Right Arm Position: Sitting Cuff Size: Large Pulse: (!) 107 Resp: 20 Temp: 97.7 ?F (36.5 ?C) TempSrc: Temporal SpO2: 99% Weight: (!) 137.8 kg ECOG Performance Status: 1Physical ExamConstitutional:     Comments: Chronically ill-appearing gentleman communicating with text-to-speech software.  No acute distress. HENT:    Mouth/Throat:    Comments: Trach with tumor growing around stoma.Cardiovascular:    Rate and Rhythm: Normal rate and regular rhythm.    Pulses: Normal pulses.    Heart sounds: Normal heart sounds. Pulmonary:    Effort: Pulmonary effort is normal.    Breath sounds: Normal breath sounds. Chest:    Comments: Port in right chest wall with no erythema, drainage or tenderness.Abdominal:    General: Abdomen is flat. Bowel sounds are normal. There is no distension.    Palpations: Abdomen is soft.    Comments: G tube in left abdomen, mild tenderness to palpation.  Small amount of drainage. Musculoskeletal:       General: No swelling or tenderness. Normal range of motion. Lymphadenopathy:    Cervical: Cervical adenopathy (2cm right cervical mass) present. Skin:   General: Skin is warm and dry. Neurological:    General: No focal deficit present.    Mental Status: He is oriented to person, place, and time. Psychiatric:       Mood and Affect: Mood normal.       Behavior: Behavior normal. Data Review:Lab Results Component Value Date  WBC 14.2 (H) 09/22/2020  HGB 10.2 (L) 09/22/2020  HCT 30.90 (L) 09/22/2020  MCV 83.3 09/22/2020  PLT 84 (L) 09/22/2020   Chemistry  Lab Results Component Value Date  NA 129 (L) 09/22/2020  K 5.5 (H) 09/22/2020  CL 90 (L) 09/22/2020  CO2 26 09/22/2020  BUN 48 (H) 09/22/2020  CREATININE 2.40 (H) 09/22/2020  GLU 213 (H) 09/22/2020  Lab Results Component Value Date  CALCIUM 10.4 (H) 09/22/2020  ALKPHOS 77 09/22/2020  AST 13 09/22/2020  ALT 23 09/22/2020  BILITOT 0.3 09/22/2020  IMPRESSION and PLAN:1.	Laryngeal cancer:  Noam is currently being treated with Cisplatin concurrent with radiation.  He is due today for Cycle  1 Day 8 of Cisplatin, but this will be held due to AKI, likely secondary to Cisplatin.  He is also thrombocytopenic secondary to treatment.  Case reviewed with Dr. Daphene Jaeger.  He will receive IVF today and tomorrow and will return in one week for follow-up and to potentially resume treatment.  See below for management of other active issues.2.	Pain:  Secondary to disease and esophagitis.  He will continue Oxycodone 5mg  PO Q4H PRN.  Note made for [facility] to offer it to him more frequently since he notes that pain becomes uncontrolled when he has to wait too long for intervention.  For esophagitis, will start him on Gabapentin solution.  He will titrate up starting with 125mg  PO QHS x3 nights, then 125mg  PO BID x3 days then 125mg  PO TID (dosed based on concentration of solution and kidney function) pending tolerance.3.	Anxiety/depression:  This is a major issue for him.  We discussed that an antidepressant such a Lexapro would be reasonable in addition to PRN Ativan, but since we are already starting Gabapentin for esophagitis, we will consider adding Lexapro on next visit.  Until then, he can continue Ativan 0.5mg  PO Q6H PRN.4.	Hypercalcemia:  Likely secondary to malignancy.  Calcium has improved since last visit, will continue to follow.5.	Weight loss:  He will continue follow-up with Nutrition for management of tube feedings.

## 2020-09-23 NOTE — Progress Notes
Patient arrives for day 2 IVFs.  Patient c/o bleeding at trach site yesterday evening secondary to being on prolonged oxygen without moisture.  Patient denies any other changes or concerns.  IVFs administered as ordered.  Patient tolerated IVFs well.  Patient to return next week as scheduled.  Patient discharged back to [facility].

## 2020-09-24 ENCOUNTER — Encounter: Admit: 2020-09-24 | Payer: PRIVATE HEALTH INSURANCE

## 2020-09-24 ENCOUNTER — Telehealth: Admit: 2020-09-24 | Payer: PRIVATE HEALTH INSURANCE | Attending: Hematology & Oncology

## 2020-09-24 DIAGNOSIS — C760 Malignant neoplasm of head, face and neck: Secondary | ICD-10-CM

## 2020-09-24 NOTE — Telephone Encounter
Spoke to [employer] at radiation therapy.  Arthur [name] reports that per [facility], trach has been bleeding.  Patient was sent from [facility] to radiation therapy.  RN reports that the ABD pad under the trach was saturated with blood.  Patient is alert and oriented and begging not to go to the hospital  Patient is being transported to [facility] ER.  Message sent to Dr. Daphene Jaeger and Dr. Daryel November.

## 2020-09-25 ENCOUNTER — Encounter: Admit: 2020-09-25 | Payer: PRIVATE HEALTH INSURANCE

## 2020-09-26 ENCOUNTER — Encounter: Admit: 2020-09-26 | Payer: PRIVATE HEALTH INSURANCE

## 2020-09-28 ENCOUNTER — Ambulatory Visit: Admit: 2020-09-28 | Payer: PRIVATE HEALTH INSURANCE

## 2020-09-28 ENCOUNTER — Ambulatory Visit: Admit: 2020-09-28 | Payer: MEDICARE | Attending: Hematology & Oncology

## 2020-09-28 ENCOUNTER — Encounter: Admit: 2020-09-28 | Payer: PRIVATE HEALTH INSURANCE | Attending: Hematology & Oncology

## 2020-09-28 ENCOUNTER — Inpatient Hospital Stay: Admit: 2020-09-28 | Discharge: 2020-09-28 | Payer: MEDICARE

## 2020-09-28 DIAGNOSIS — Z794 Long term (current) use of insulin: Secondary | ICD-10-CM

## 2020-09-28 DIAGNOSIS — R52 Pain, unspecified: Secondary | ICD-10-CM

## 2020-09-28 DIAGNOSIS — R58 Hemorrhage, not elsewhere classified: Secondary | ICD-10-CM

## 2020-09-28 DIAGNOSIS — Z5111 Encounter for antineoplastic chemotherapy: Secondary | ICD-10-CM

## 2020-09-28 DIAGNOSIS — C880 Waldenstrom macroglobulinemia: Secondary | ICD-10-CM

## 2020-09-28 DIAGNOSIS — C760 Malignant neoplasm of head, face and neck: Secondary | ICD-10-CM

## 2020-09-28 DIAGNOSIS — Z7984 Long term (current) use of oral hypoglycemic drugs: Secondary | ICD-10-CM

## 2020-09-28 DIAGNOSIS — Z79899 Other long term (current) drug therapy: Secondary | ICD-10-CM

## 2020-09-28 DIAGNOSIS — Z930 Tracheostomy status: Secondary | ICD-10-CM

## 2020-09-28 DIAGNOSIS — C329 Malignant neoplasm of larynx, unspecified: Secondary | ICD-10-CM

## 2020-09-28 DIAGNOSIS — Z87891 Personal history of nicotine dependence: Secondary | ICD-10-CM

## 2020-09-28 DIAGNOSIS — E7800 Pure hypercholesterolemia, unspecified: Secondary | ICD-10-CM

## 2020-09-28 DIAGNOSIS — I10 Essential (primary) hypertension: Secondary | ICD-10-CM

## 2020-09-28 DIAGNOSIS — E119 Type 2 diabetes mellitus without complications: Secondary | ICD-10-CM

## 2020-09-28 LAB — COMPREHENSIVE METABOLIC PANEL
BKR A/G RATIO: 1.2 (ref 1.0–2.2)
BKR ALANINE AMINOTRANSFERASE (ALT): 43 U/L (ref 9–59)
BKR ALBUMIN: 3.1 g/dL — ABNORMAL LOW (ref 3.6–4.9)
BKR ALKALINE PHOSPHATASE: 79 U/L (ref 9–122)
BKR ANION GAP: 10 (ref 7–17)
BKR ASPARTATE AMINOTRANSFERASE (AST): 20 U/L (ref 10–35)
BKR AST/ALT RATIO: 0.5
BKR BILIRUBIN TOTAL: 0.4 mg/dL (ref <=1.2)
BKR BLOOD UREA NITROGEN: 31 mg/dL — ABNORMAL HIGH (ref 6–20)
BKR BUN / CREAT RATIO: 20.5 (ref 8.0–23.0)
BKR CALCIUM: 9.2 mg/dL (ref 8.8–10.2)
BKR CHLORIDE: 94 mmol/L — ABNORMAL LOW (ref 98–107)
BKR CO2: 28 mmol/L (ref 20–30)
BKR CREATININE: 1.51 mg/dL — ABNORMAL HIGH (ref 0.40–1.30)
BKR EGFR (AFR AMER): 58 mL/min/{1.73_m2} (ref >60)
BKR EGFR (NON AFRICAN AMERICAN): 48 mL/min/{1.73_m2} (ref >60)
BKR GLOBULIN: 2.6 g/dL (ref 2.3–3.5)
BKR GLUCOSE: 208 mg/dL — ABNORMAL HIGH (ref 70–100)
BKR POTASSIUM: 5.5 mmol/L — ABNORMAL HIGH (ref 3.3–5.1)
BKR PROTEIN TOTAL: 5.7 g/dL — ABNORMAL LOW (ref 6.6–8.7)
BKR SODIUM: 132 mmol/L — ABNORMAL LOW (ref 136–144)

## 2020-09-28 LAB — CBC WITH AUTO DIFFERENTIAL
BKR WAM ABSOLUTE IMMATURE GRANULOCYTES.: 0.05 x 1000/??L (ref 0.00–0.30)
BKR WAM ABSOLUTE LYMPHOCYTE COUNT.: 0.55 x 1000/??L — ABNORMAL LOW (ref 0.60–3.70)
BKR WAM ABSOLUTE NRBC (2 DEC): 0 x 1000/??L (ref 0.00–1.00)
BKR WAM ANALYZER ANC: 9.15 x 1000/ÂµL — ABNORMAL HIGH (ref 2.00–7.60)
BKR WAM BASOPHIL ABSOLUTE COUNT.: 0.04 x 1000/??L (ref 0.00–1.00)
BKR WAM BASOPHILS: 0.4 % (ref 0.0–1.4)
BKR WAM EOSINOPHILS: 3.6 % (ref 0.0–5.0)
BKR WAM HEMATOCRIT (2 DEC): 27.3 % — ABNORMAL LOW (ref 38.50–50.00)
BKR WAM HEMOGLOBIN: 8.8 g/dL — ABNORMAL LOW (ref 13.2–17.1)
BKR WAM IMMATURE GRANULOCYTES: 0.4 % (ref 0.0–1.0)
BKR WAM LYMPHOCYTES: 4.8 % — ABNORMAL LOW (ref 17.0–50.0)
BKR WAM MCH (PG): 27.7 pg (ref 27.0–33.0)
BKR WAM MCHC: 32.2 g/dL (ref 31.0–36.0)
BKR WAM MCV: 85.8 fL (ref 80.0–100.0)
BKR WAM MONOCYTE ABSOLUTE COUNT.: 1.17 x 1000/??L — ABNORMAL HIGH (ref 0.00–1.00)
BKR WAM MONOCYTES: 10.3 % (ref 4.0–12.0)
BKR WAM MPV: 9.2 fL (ref 8.0–12.0)
BKR WAM NEUTROPHILS: 80.5 % — ABNORMAL HIGH (ref 39.0–72.0)
BKR WAM NUCLEATED RED BLOOD CELLS: 0 % (ref 0.0–1.0)
BKR WAM PLATELETS: 147 x1000/??L — ABNORMAL LOW (ref 150–420)
BKR WAM RDW-CV: 13.9 % (ref 11.0–15.0)
BKR WAM RED BLOOD CELL COUNT.: 3.18 M/??L — ABNORMAL LOW (ref 4.00–6.00)
BKR WAM WHITE BLOOD CELL COUNT: 11.4 x1000/??L — ABNORMAL HIGH (ref 4.0–11.0)

## 2020-09-28 LAB — MAGNESIUM: BKR MAGNESIUM: 1.7 mg/dL (ref 1.7–2.4)

## 2020-09-28 MED ORDER — EPINEPHRINE 0.3 MG/0.3 ML INJECTION, AUTO-INJECTOR
0.3 mg/ mL | INTRAMUSCULAR | Status: DC | PRN
Start: 2020-09-28 — End: 2020-09-29

## 2020-09-28 MED ORDER — HEPARIN, PORCINE (PF) 100 UNIT/ML IN 0.9% SODIUM CHLORIDE IV SYRINGE
100 unit/mL | Status: DC | PRN
Start: 2020-09-28 — End: 2020-09-29
  Administered 2020-09-28: 20:00:00 100 mL

## 2020-09-28 MED ORDER — SODIUM CHLORIDE 0.9 % BOLUS (NEW BAG)
0.9 % | Freq: Once | INTRAVENOUS | Status: DC | PRN
Start: 2020-09-28 — End: 2020-09-29

## 2020-09-28 MED ORDER — SODIUM CHLORIDE 0.9 % INTRAVENOUS SOLUTION
Freq: Once | INTRAVENOUS | Status: CP
Start: 2020-09-28 — End: ?
  Administered 2020-09-28: 16:00:00 via INTRAVENOUS

## 2020-09-28 MED ORDER — MEPERIDINE 25 MG/2.5 ML IN 0.9% SODIUM CHLORIDE
Freq: Once | INTRAVENOUS | Status: DC | PRN
Start: 2020-09-28 — End: 2020-09-29

## 2020-09-28 MED ORDER — CISPLATIN INFUSION
Freq: Once | INTRAVENOUS | Status: CP
Start: 2020-09-28 — End: ?
  Administered 2020-09-28: 17:00:00 500.000 mL/h via INTRAVENOUS

## 2020-09-28 MED ORDER — FAMOTIDINE 4 MG/ML IN 0.9% SODIUM CHLORIDE (ADULT)
Freq: Once | INTRAVENOUS | Status: DC | PRN
Start: 2020-09-28 — End: 2020-09-29

## 2020-09-28 MED ORDER — SODIUM CHLORIDE 0.9 % (FLUSH) INJECTION SYRINGE
0.9 % | Status: DC | PRN
Start: 2020-09-28 — End: 2020-09-29
  Administered 2020-09-28: 20:00:00 0.9 mL

## 2020-09-28 MED ORDER — DEXAMETHASONE SODIUM PHOSPHATE (PF) 10 MG/ML INJECTION SOLUTION
10 mg/mL | Freq: Once | INTRAVENOUS | Status: CP
Start: 2020-09-28 — End: ?
  Administered 2020-09-28: 16:00:00 10 mL via INTRAVENOUS

## 2020-09-28 MED ORDER — ALBUTEROL SULFATE 2.5 MG/3 ML (0.083 %) SOLUTION FOR NEBULIZATION
2.5 mg /3 mL (0.083 %) | RESPIRATORY_TRACT | Status: DC | PRN
Start: 2020-09-28 — End: 2020-09-29

## 2020-09-28 MED ORDER — HYDROCORTISONE SODIUM SUCCINATE 100 MG SOLUTION FOR INJECTION
100 mg | Freq: Once | INTRAVENOUS | Status: DC | PRN
Start: 2020-09-28 — End: 2020-09-29

## 2020-09-28 MED ORDER — DIPHENHYDRAMINE 50 MG/ML INJECTION SOLUTION
50 mg/mL | Freq: Once | INTRAVENOUS | Status: DC | PRN
Start: 2020-09-28 — End: 2020-09-29

## 2020-09-28 MED ORDER — HEPARIN, PORCINE (PF) 10 UNIT/ML IN 0.9% SODIUM CHLORIDE IV SYRINGE
10 units/mL | Status: DC | PRN
Start: 2020-09-28 — End: 2020-09-29

## 2020-09-28 MED ORDER — APREPITANT 7.2 MG/ML INTRAVENOUS EMULSION
7.2 mg/mL | Freq: Once | INTRAVENOUS | Status: CP
Start: 2020-09-28 — End: ?
  Administered 2020-09-28: 16:00:00 7.2 mL via INTRAVENOUS

## 2020-09-28 MED ORDER — PALONOSETRON 0.25 MG/5 ML INTRAVENOUS SOLUTION
0.25 mg/5 mL | Freq: Once | INTRAVENOUS | Status: CP
Start: 2020-09-28 — End: ?
  Administered 2020-09-28: 16:00:00 0.25 mL via INTRAVENOUS

## 2020-09-28 NOTE — Progress Notes
Pt saw Dr Daphene Jaeger today and was cleared for treatment. Pt with scant amt of blood noted coming from trach. Cr improved at 1.51 per Dr Daphene Jaeger ok to treat. Recieved 500 ml NS IVF pre and post treatment. Premedicated as ordered. Received C1D15 of Cisplatin through port, + BR, tolerated well. Voided 800 cc clear ylw urine without difficulty. Port flushed per protocol, + BR, de-accessed site benign, gauze applied. Pt left in stable condition, with assist of w/c.

## 2020-09-29 ENCOUNTER — Ambulatory Visit: Admit: 2020-09-29 | Payer: MEDICARE

## 2020-09-29 ENCOUNTER — Encounter: Admit: 2020-09-29 | Payer: PRIVATE HEALTH INSURANCE

## 2020-09-29 DIAGNOSIS — I10 Essential (primary) hypertension: Secondary | ICD-10-CM

## 2020-09-29 DIAGNOSIS — E119 Type 2 diabetes mellitus without complications: Secondary | ICD-10-CM

## 2020-09-29 DIAGNOSIS — E7800 Pure hypercholesterolemia, unspecified: Secondary | ICD-10-CM

## 2020-09-29 DIAGNOSIS — C760 Malignant neoplasm of head, face and neck: Secondary | ICD-10-CM

## 2020-09-29 MED ORDER — SODIUM CHLORIDE 0.9 % INTRAVENOUS SOLUTION
Freq: Once | INTRAVENOUS | Status: CP
Start: 2020-09-29 — End: ?
  Administered 2020-09-29: 20:00:00 via INTRAVENOUS

## 2020-09-29 MED ORDER — ALBUTEROL SULFATE 2.5 MG/3 ML (0.083 %) SOLUTION FOR NEBULIZATION
2.5 mg /3 mL (0.083 %) | RESPIRATORY_TRACT | Status: DC
Start: 2020-09-29 — End: 2020-09-30
  Administered 2020-09-29: 20:00:00 2.5 mL via RESPIRATORY_TRACT

## 2020-09-29 NOTE — Progress Notes
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  Initial evaluation Spring 2021 was unremarkable.  Symptoms progressed and he underwent laryngoscopy 06/15/20 with finding of a mass in the left vocal cord, extending to the false vocal cords and arytenoid area.  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     07/15/2020 Notable Event    Admitted to [facility] for stridor.  Tracheostomy placed emergently.     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 He has been doing poorly. There has been increased bleeding from the tumor around his trach. Last week he was seen in the emergency room and released. He has been using a feeding tube, but is unable to swallow. He notes significant depression. Pain is controlled.       Review of Systems   Constitutional: Negative for activity change, appetite change, chills, fatigue, fever and unexpected weight change.   HENT: Positive for sore throat and voice change. Negative for congestion, ear pain, facial swelling and nosebleeds.         Left neck pain.  Tracheostomy   Eyes: Negative for discharge, redness and itching.        MILD GLAUCOMA, RETINOPATHY Respiratory: Negative for cough, chest tightness, shortness of breath and wheezing.    Cardiovascular: Negative for chest pain, palpitations and leg swelling.   Gastrointestinal: Negative for abdominal distention, abdominal pain, anal bleeding, blood in stool, constipation, diarrhea, nausea and vomiting.        Increased constipation.   Endocrine: Negative for cold intolerance and heat intolerance.   Genitourinary: Negative for difficulty urinating.   Musculoskeletal: Negative for neck pain.   Allergic/Immunologic: Negative for environmental allergies.   Neurological: Negative for dizziness, syncope, speech difficulty, weakness, numbness and headaches.        NO NEUROPATHY   Hematological: Positive for adenopathy. Does not bruise/bleed easily.        Right cervical adenopathy.   Psychiatric/Behavioral: Negative for agitation and confusion. The patient is not nervous/anxious.      A comprehensive 12-point review of systems was queried and is otherwise negative.    PAST MEDICAL HISTORY: Arthur [name] has a past medical history of Diabetes mellitus (HC Code) (HC CODE), Hypercholesteremia, and Hypertension.    PAST SURGICAL HISTORY: He has no past surgical history on file.    ALLERGIES: Patient has no known allergies.     MEDICATIONS:   ?  albuterol, 3 mL, Nebulization, Daily  ?  atorvastatin, TAKE 1 TABLET BY MOUTH ONCE DAILY  ?  azelastine, 2 spray, each  nostril, Q8H PRN  ?  famotidine, 20 mg, Oral, BID  ?  insulin aspart U-100, Inject under the skin 3 (three) times daily before meals. 8 units before breakfast and lunch, 14 units before dinner  ?  insulin glargine,hum.rec.anlog (TOUJEO SOLOSTAR U-300 INSULIN SUBQ), 62 Units, Subcutaneous, Daily  ?  lisinopriL, TAKE 1 TABLET BY MOUTH ONCE DAILY  ?  LORazepam, 0.5 mg, Oral, Q6H PRN  ?  metFORMIN, 1,000 mg, Oral, Daily  ?  oxyCODONE, 5 mg, Oral, Q4H PRN  ?  polyethylene glycol, 17 g, Oral, Daily  ?  traZODone, 25 mg, Oral, Nightly  No current facility-administered medications for this visit.  ?  albuterol  ?  diphenhydrAMINE  ?  EPINEPHrine  ?  EPINEPHrine  ?  famotidine (PEPCID) IV (All ages)  ?  heparin PF in 0.9 % sodium chloride  ?  heparin PF in 0.9 % sodium chloride  ?  hydrocortisone IV Push OR IVPB (SOLU-CORTEF)  ?  meperidine PF  ?  sodium chloride 0.9 % (new bag)  ?  sodium chloride  SOCIAL HISTORY: Mr. Olmo  reports that he quit smoking about 7 years ago. He has a 30.00 pack-year smoking history. He does not have any smokeless tobacco history on file. He reports current alcohol use of about 14.0 standard drinks of alcohol per week. No history on file for drug use.    FAMILY HISTORY: His family history includes Aneurysm in his mother.    PHYSICAL EXAM:  Vitals:    09/28/20 1021   BP: 116/77   Pulse: (!) 108   Resp: 20   Temp: 97.8 ?F (36.6 ?C)       Physical Exam  Constitutional:       General: He is not in acute distress.     Appearance: He is well-developed. He is not ill-appearing.      Comments: Ill-appearing. Moderate distress.   HENT:      Head: Normocephalic and atraumatic.     Eyes:      Conjunctiva/sclera: Conjunctivae normal.   Neck:      Thyroid: No thyromegaly.   Cardiovascular:      Rate and Rhythm: Normal rate and regular rhythm.      Heart sounds: No murmur heard.     Pulmonary:      Breath sounds: Normal breath sounds. No wheezing or rales.   Chest:      Breasts:         Right: No mass.         Left: No mass.       Abdominal:      General: Bowel sounds are normal. There is no distension.      Palpations: Abdomen is soft. There is no mass.      Tenderness: There is no abdominal tenderness.   Musculoskeletal:         General: Normal range of motion.      Cervical back: Neck supple.   Lymphadenopathy:      Cervical: No cervical adenopathy.      Upper Body:      Left upper body: No supraclavicular adenopathy.   Skin:     General: Skin is warm and dry.      Findings: No ecchymosis, erythema, petechiae or rash.   Neurological:      Mental Status: He is alert.      Sensory: No sensory deficit.           Data Review:  Lab Results   Component Value Date    WBC 11.4 (H) 09/28/2020    HGB 8.8 (L) 09/28/2020    HCT 27.30 (L) 09/28/2020    MCV 85.8 09/28/2020    PLT 147 (L) 09/28/2020       Chemistry    Lab Results   Component Value Date    NA 132 (L) 09/28/2020    K 5.5 (H) 09/28/2020    CL 94 (L) 09/28/2020    CO2 28 09/28/2020    BUN 31 (H) 09/28/2020    CREATININE 1.51 (H) 09/28/2020    GLU 208 (H) 09/28/2020    Lab Results   Component Value Date    CALCIUM 9.2 09/28/2020    ALKPHOS 79 09/28/2020    AST 20 09/28/2020    ALT 43 09/28/2020    BILITOT 0.4 09/28/2020            Component      Latest Ref Rng & Units 06/16/2020   Immunofixation Electrophoresis Gel       Normal immunofixation electrophoresis. No evidence of a serum monoclonal component.         Component      Latest Ref Rng & Units 06/16/2020 12/16/2019 05/31/2019 03/05/2019   Immunoglobulin G      700-1,600 mg/dL 161 (L) 096 (L) 045 (L) 542 (L)   Immunoglobulin A      70 - 470 mg/dL 11 (L) 11 (L) 10 (L) 11 (L)   IgM      40 - 230 mg/dL 38 (L) 23 (L) 27 (L) 23 (L)   Kappa Free Light Chains, Serum      0.33 - 1.94 mg/dL 4.09 8.11 9.14 7.82   Lambda Free Light Chains, Serum      0.57 - 2.63 mg/dL 9.56 2.13 0.86 5.78   Free Kappa/Lambda Ratio      0.26 - 1.65 0.98 1.01 1.20 1.45   Beta-2 Microglobulin      0.80 - 2.20 mg/L 3.28 (H) 2.46 (H) 2.82 (H) 2.59 (H)       07/01/2020 PET/[place]  Head/Neck: Hypermetabolic mass, left vocal cord 2.4 x 1.3 cm SUV 8.3, extends to the midline.   There may be erosion of the thyroid cartilage. Vocal cords are nearly fully opposed.   Hypermetabolic right sub-mandibular node 9 mm SUV 5.0.  Left sub-mental node 9 mm SUV 1.3.  Chest: Right subclavian lymph node 9 mm SUV 2.9.  Axillary lymph nodes: Bilateral, non enlarged, right 12 mm SUV 3.6.  Abdomen/Pelvis: Non-enlarged inguinal lymph nodes, left 9 mm SUV 11.4.  Pelvis: Osseous uptake, left anterior iliac crest.      09/14/20      IMPRESSION and PLAN:   1. 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 His cancer is growing despite treatment. Options are limited. Of note, the right neck mass is stable. His renal function has recovered. He will receive Cisplatin Day 15.    2. Pain: Controlled.    3. Bleeding: He has bleeding from the tracheostomy. The treatment is going to be treating his underlying disease. He has already been seen in the ER.    PLAN:  1. Day 15.  2. Return 1 week, reevaluation and treatment.          Scribed for Gevena Mart, MD by Beatrix Fetters, medical scribe September 28, 2020    The documentation recorded by the scribe accurately reflects the services I personally performed and the decisions made by me. I reviewed  and confirmed all material entered and/or pre-charted by the scribe.

## 2020-09-29 NOTE — Progress Notes
Pt arrived for supportive care today. Pt reports no changes from yesterday. Voiding large amounts of clear yellow urine. Pt c/o sob at visit, per Dr Daphene Jaeger, albuterol neb given x 1 with + effect. No distress noted. NS 1 liter administered over one hour, through port, + BR. Port flushed per protocol, + BR de-accessed site benign, gauze applied. Pt left with assist of w/c to transport back to [facility]. Pt to return on 2/1.

## 2020-09-30 ENCOUNTER — Encounter: Admit: 2020-09-30 | Payer: PRIVATE HEALTH INSURANCE

## 2020-10-06 ENCOUNTER — Encounter: Admit: 2020-10-06 | Payer: PRIVATE HEALTH INSURANCE | Attending: Hematology & Oncology

## 2020-10-06 ENCOUNTER — Ambulatory Visit: Admit: 2020-10-06 | Payer: MEDICARE | Attending: Hematology & Oncology

## 2020-10-06 ENCOUNTER — Ambulatory Visit: Admit: 2020-10-06 | Payer: PRIVATE HEALTH INSURANCE

## 2020-10-06 ENCOUNTER — Inpatient Hospital Stay: Admit: 2020-10-06 | Discharge: 2020-10-06 | Payer: MEDICARE

## 2020-10-06 ENCOUNTER — Encounter: Admit: 2020-10-06 | Payer: PRIVATE HEALTH INSURANCE | Attending: Anesthesiology

## 2020-10-06 ENCOUNTER — Ambulatory Visit: Admit: 2020-10-06 | Payer: PRIVATE HEALTH INSURANCE | Attending: Hematology & Oncology

## 2020-10-06 ENCOUNTER — Encounter: Admit: 2020-10-06 | Payer: PRIVATE HEALTH INSURANCE | Attending: Vascular and Interventional Radiology

## 2020-10-06 DIAGNOSIS — E7800 Pure hypercholesterolemia, unspecified: Secondary | ICD-10-CM

## 2020-10-06 DIAGNOSIS — C760 Malignant neoplasm of head, face and neck: Secondary | ICD-10-CM

## 2020-10-06 DIAGNOSIS — I10 Essential (primary) hypertension: Secondary | ICD-10-CM

## 2020-10-06 DIAGNOSIS — J9601 Acute respiratory failure with hypoxia: Secondary | ICD-10-CM

## 2020-10-06 DIAGNOSIS — E119 Type 2 diabetes mellitus without complications: Secondary | ICD-10-CM

## 2020-10-06 LAB — COMPREHENSIVE METABOLIC PANEL
BKR A/G RATIO: 1.2 (ref 1.0–2.2)
BKR ALANINE AMINOTRANSFERASE (ALT): 21 U/L (ref 9–59)
BKR ALBUMIN: 3 g/dL — ABNORMAL LOW (ref 3.6–4.9)
BKR ALKALINE PHOSPHATASE: 83 U/L (ref 9–122)
BKR ANION GAP: 11 (ref 7–17)
BKR ASPARTATE AMINOTRANSFERASE (AST): 12 U/L (ref 10–35)
BKR AST/ALT RATIO: 0.6 x 1000/??L — ABNORMAL HIGH (ref 0.00–1.00)
BKR BILIRUBIN TOTAL: 0.2 mg/dL (ref <=1.2)
BKR BLOOD UREA NITROGEN: 34 mg/dL — ABNORMAL HIGH (ref 6–20)
BKR BUN / CREAT RATIO: 21.1 (ref 8.0–23.0)
BKR CALCIUM: 9 mg/dL (ref 8.8–10.2)
BKR CHLORIDE: 89 mmol/L — ABNORMAL LOW (ref 98–107)
BKR CO2: 27 mmol/L (ref 20–30)
BKR CREATININE: 1.61 mg/dL — ABNORMAL HIGH (ref 0.40–1.30)
BKR EGFR (AFR AMER): 54 mL/min/{1.73_m2} (ref >60)
BKR EGFR (NON AFRICAN AMERICAN): 45 mL/min/{1.73_m2} (ref >60)
BKR GLOBULIN: 2.5 g/dL (ref 2.3–3.5)
BKR GLUCOSE: 322 mg/dL — ABNORMAL HIGH (ref 70–100)
BKR POTASSIUM: 5.9 mmol/L — ABNORMAL HIGH (ref 3.3–5.1)
BKR PROTEIN TOTAL: 5.5 g/dL — ABNORMAL LOW (ref 6.6–8.7)
BKR SODIUM: 127 mmol/L — ABNORMAL LOW (ref 136–144)
BKR WAM MONOCYTES: 3 g/dL — ABNORMAL LOW (ref 3.6–4.9)
BKR WAM RDW-CV: 34 mg/dL — ABNORMAL HIGH (ref 6–20)

## 2020-10-06 LAB — CBC WITH AUTO DIFFERENTIAL
BKR WAM ABSOLUTE IMMATURE GRANULOCYTES.: 0.13 x 1000/??L (ref 0.00–0.30)
BKR WAM ABSOLUTE LYMPHOCYTE COUNT.: 0.3 x 1000/??L — ABNORMAL LOW (ref 0.60–3.70)
BKR WAM ABSOLUTE NRBC (2 DEC): 0 x 1000/??L — ABNORMAL HIGH (ref 0.00–1.00)
BKR WAM ANALYZER ANC: 9.91 x 1000/??L — ABNORMAL HIGH (ref 2.00–7.60)
BKR WAM BASOPHIL ABSOLUTE COUNT.: 0.02 x 1000/??L (ref 0.00–1.00)
BKR WAM BASOPHILS: 0.2 % (ref 0.0–1.4)
BKR WAM EOSINOPHIL ABSOLUTE COUNT.: 0.34 x 1000/??L (ref 0.00–1.00)
BKR WAM EOSINOPHILS: 2.8 % (ref 0.0–5.0)
BKR WAM HEMATOCRIT (2 DEC): 25.2 % — ABNORMAL LOW (ref 38.50–50.00)
BKR WAM HEMOGLOBIN: 8.1 g/dL — ABNORMAL LOW (ref 13.2–17.1)
BKR WAM IMMATURE GRANULOCYTES: 1.1 % — ABNORMAL HIGH (ref 0.0–1.0)
BKR WAM LYMPHOCYTES: 2.5 % — ABNORMAL LOW (ref 17.0–50.0)
BKR WAM MCH (PG): 27.4 pg (ref 27.0–33.0)
BKR WAM MCHC: 32.1 g/dL (ref 31.0–36.0)
BKR WAM MCV: 85.1 fL (ref 80.0–100.0)
BKR WAM MONOCYTE ABSOLUTE COUNT.: 1.36 x 1000/??L — ABNORMAL HIGH (ref 0.00–1.00)
BKR WAM MPV: 10.3 fL (ref 8.0–12.0)
BKR WAM NEUTROPHILS: 82.1 % — ABNORMAL HIGH (ref 39.0–72.0)
BKR WAM NUCLEATED RED BLOOD CELLS: 0 % (ref 0.0–1.0)
BKR WAM PLATELETS: 66 x1000/??L — ABNORMAL LOW (ref 150–420)
BKR WAM RED BLOOD CELL COUNT.: 2.96 M/??L — ABNORMAL LOW (ref 4.00–6.00)
BKR WAM WHITE BLOOD CELL COUNT: 12.1 x1000/??L — ABNORMAL HIGH (ref 4.0–11.0)

## 2020-10-06 LAB — MAGNESIUM: BKR MAGNESIUM: 1.5 mg/dL — ABNORMAL LOW (ref 1.7–2.4)

## 2020-10-06 LAB — IMMATURE PLATELET FRACTION (BH GH YH)
BKR WAM IPF, ABSOLUTE: 2.5 x1000/ÂµL (ref <20.0)
BKR WAM IPF: 3.8 % (ref 1.2–8.6)

## 2020-10-06 MED ORDER — SODIUM CHLORIDE 0.9 % INTRAVENOUS SOLUTION
Freq: Once | INTRAVENOUS | Status: CP
Start: 2020-10-06 — End: ?
  Administered 2020-10-06: 15:00:00 via INTRAVENOUS

## 2020-10-06 MED ORDER — HEPARIN, PORCINE (PF) 100 UNIT/ML IN 0.9% SODIUM CHLORIDE IV SYRINGE
100 unit/mL | Status: DC | PRN
Start: 2020-10-06 — End: 2020-10-06
  Administered 2020-10-06: 17:00:00 100 mL

## 2020-10-06 MED ORDER — MAGNESIUM OXIDE 400 MG (241.3 MG MAGNESIUM) TABLET
400 mg (241.3 mg magnesium) | ORAL_TABLET | Freq: Every day | GASTROSTOMY | 2 refills | Status: AC
Start: 2020-10-06 — End: ?

## 2020-10-06 MED ORDER — SODIUM CHLORIDE 0.9 % (FLUSH) INJECTION SYRINGE
0.9 % | Status: DC | PRN
Start: 2020-10-06 — End: 2020-10-06
  Administered 2020-10-06: 17:00:00 0.9 mL

## 2020-10-06 MED ORDER — FUROSEMIDE 20 MG TABLET
20 mg | ORAL_TABLET | Freq: Every day | GASTROSTOMY | 2 refills | Status: AC
Start: 2020-10-06 — End: ?

## 2020-10-06 MED ORDER — ALBUTEROL SULFATE 2.5 MG/3 ML (0.083 %) SOLUTION FOR NEBULIZATION
2.530.083 mg /3 mL (0.083 %) | RESPIRATORY_TRACT | Status: DC
Start: 2020-10-06 — End: 2020-10-06
  Administered 2020-10-06: 16:00:00 2.5 mL via RESPIRATORY_TRACT

## 2020-10-06 NOTE — Progress Notes
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  Initial evaluation Spring 2021 was unremarkable.  Symptoms progressed and he underwent laryngoscopy 06/15/20 with finding of a mass in the left vocal cord, extending to the false vocal cords and arytenoid area.  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     07/15/2020 Notable Event    Admitted to [facility] for stridor.  Tracheostomy placed emergently.     09/14/2020 -  Chemotherapy    Weekly Cisplatin concurrent with radiation     10/06/2020 -  Chemotherapy    Plan name: Albuterol & [facility] CENTRAL LINE (PORT,PICC,HICKMAN) FLUSHES  Plan provider: Harlen Labs, MD  Start date: 10/06/2020  Line of treatment: [No plan line of treatment]  Treatment goal: [No plan goal]  Discontinued date: Arthur [name] is still active]  Discontinued reason: [Plan is still active]         CURRENT CHEMOTHERAPY REGIMEN:  09/14/20  Weekly cisplatin with radiation    HISTORY OF PRESENT ILLNESS:  The patient is seen in follow up for the evaluation of head and neck cancer. He has persistent discomfort in the neck. Nausea has been controlled.     He is unable to swallow and is using the feeding tube exclusively. He has mild nausea, responsive to medication. Depression persists.    He was seen by Arthur [name] who performed an endoscopy through the trach and found no evidence of tumor below the larynx       Review of Systems   Constitutional: Negative for activity change, appetite change, chills, fatigue, fever and unexpected weight change.   HENT: Positive for sore throat and voice change. Negative for congestion, ear pain, facial swelling and nosebleeds.         Left neck pain.  Tracheostomy   Eyes: Negative for discharge, redness and itching.        MILD GLAUCOMA, RETINOPATHY   Respiratory: Negative for cough, chest tightness, shortness of breath and wheezing.    Cardiovascular: Negative for chest pain, palpitations and leg swelling.   Gastrointestinal: Negative for abdominal distention, abdominal pain, anal bleeding, blood in stool, constipation, diarrhea, nausea and vomiting.   Endocrine: Negative for cold intolerance and heat intolerance.   Genitourinary: Negative for difficulty urinating.   Musculoskeletal: Negative for neck pain.   Allergic/Immunologic: Negative for environmental allergies.   Neurological: Negative for dizziness, syncope, speech difficulty, weakness, numbness and headaches.        Mild, stable peripheral neuropathy.    Hematological: Positive for adenopathy. Does not bruise/bleed easily.        Right cervical adenopathy.   Psychiatric/Behavioral: Negative for agitation and confusion. The patient is not nervous/anxious.      A comprehensive 12-point review of systems was queried and is otherwise negative.    PAST  MEDICAL HISTORY: Arthur [name] has a past medical history of Diabetes mellitus (HC Code) (HC CODE), Hypercholesteremia, and Hypertension.    PAST SURGICAL HISTORY: He has no past surgical history on file.    ALLERGIES: Patient has no known allergies.     MEDICATIONS:   ?  albuterol, 3 mL, Nebulization, Daily  ?  atorvastatin, TAKE 1 TABLET BY MOUTH ONCE DAILY  ?  azelastine, 2 spray, each nostril, Q8H PRN  ?  famotidine, 20 mg, Oral, BID  ?  furosemide, 20 mg, Per G Tube, Daily  ?  insulin aspart U-100, Inject under the skin 3 (three) times daily before meals. 8 units before breakfast and lunch, 14 units before dinner  ?  insulin glargine,hum.rec.anlog (TOUJEO SOLOSTAR U-300 INSULIN SUBQ), 62 Units, Subcutaneous, Daily  ?  lisinopriL, TAKE 1 TABLET BY MOUTH ONCE DAILY  ?  LORazepam, 0.5 mg, Oral, Q6H PRN  ?  magnesium oxide, 1 tablet, Per G Tube, Daily  ?  metFORMIN, 1,000 mg, Oral, Daily  ?  oxyCODONE, 5 mg, Oral, Q4H PRN  ?  polyethylene glycol, 17 g, Oral, Daily  ?  traZODone, 25 mg, Oral, Nightly  No current facility-administered medications for this visit.  ?  albuterol  ?  heparin PF in 0.9 % sodium chloride  ?  sodium chloride  SOCIAL HISTORY: Arthur [name]  reports that he quit smoking about 7 years ago. He has a 30.00 pack-year smoking history. He does not have any smokeless tobacco history on file. He reports current alcohol use of about 14.0 standard drinks of alcohol per week. No history on file for drug use.    FAMILY HISTORY: His family history includes Aneurysm in his mother.    PHYSICAL EXAM:  Vitals:    10/06/20 0841   BP: 130/82   Pulse: (!) 107   Resp: 20   Temp: (!) 96.2 ?F (35.7 ?C)       Physical Exam  Constitutional:       General: He is not in acute distress.     Appearance: He is well-developed. He is not ill-appearing.      Comments: Ill-appearing. Moderate distress.   HENT:      Head: Normocephalic and atraumatic.     Eyes:      Conjunctiva/sclera: Conjunctivae normal.   Neck:      Thyroid: No thyromegaly.   Cardiovascular:      Rate and Rhythm: Normal rate and regular rhythm.      Heart sounds: No murmur heard.     Pulmonary:      Breath sounds: Normal breath sounds. No wheezing or rales.   Chest:      Breasts:         Right: No mass.         Left: No mass.       Abdominal:      General: Bowel sounds are normal. There is no distension.      Palpations: Abdomen is soft. There is no mass.      Tenderness: There is no abdominal tenderness.   Musculoskeletal:         General: Normal range of motion.      Cervical back: Neck supple.   Lymphadenopathy: Cervical: No cervical adenopathy.      Upper Body:      Left upper body: No supraclavicular adenopathy.   Skin:     General: Skin is warm and dry.      Findings: No ecchymosis, erythema, petechiae  or rash.   Neurological:      Mental Status: He is alert.      Sensory: No sensory deficit.           Data Review:    Lab Results   Component Value Date    WBC 12.1 (H) 10/06/2020    HGB 8.1 (L) 10/06/2020    HCT 25.20 (L) 10/06/2020    MCV 85.1 10/06/2020    PLT 66 (L) 10/06/2020       Chemistry    Lab Results   Component Value Date    NA 127 (L) 10/06/2020    K 5.9 (H) 10/06/2020    CL 89 (L) 10/06/2020    CO2 27 10/06/2020    BUN 34 (H) 10/06/2020    CREATININE 1.61 (H) 10/06/2020    GLU 322 (H) 10/06/2020    Lab Results   Component Value Date    CALCIUM 9.0 10/06/2020    ALKPHOS 83 10/06/2020    AST 12 10/06/2020    ALT 21 10/06/2020    BILITOT 0.2 10/06/2020            Component      Latest Ref Rng & Units 06/16/2020   Immunofixation Electrophoresis Gel       Normal immunofixation electrophoresis. No evidence of a serum monoclonal component.         Component      Latest Ref Rng & Units 06/16/2020 12/16/2019 05/31/2019 03/05/2019   Immunoglobulin G      700-1,600 mg/dL 016 (L) 010 (L) 932 (L) 542 (L)   Immunoglobulin A      70 - 470 mg/dL 11 (L) 11 (L) 10 (L) 11 (L)   IgM      40 - 230 mg/dL 38 (L) 23 (L) 27 (L) 23 (L)   Kappa Free Light Chains, Serum      0.33 - 1.94 mg/dL 3.55 7.32 2.02 5.42   Lambda Free Light Chains, Serum      0.57 - 2.63 mg/dL 7.06 2.37 6.28 3.15   Free Kappa/Lambda Ratio      0.26 - 1.65 0.98 1.01 1.20 1.45   Beta-2 Microglobulin      0.80 - 2.20 mg/L 3.28 (H) 2.46 (H) 2.82 (H) 2.59 (H)       07/01/2020 PET/[place]  Head/Neck: Hypermetabolic mass, left vocal cord 2.4 x 1.3 cm SUV 8.3, extends to the midline.   There may be erosion of the thyroid cartilage. Vocal cords are nearly fully opposed.   Hypermetabolic right sub-mandibular node 9 mm SUV 5.0.  Left sub-mental node 9 mm SUV 1.3.  Chest: Right subclavian lymph node 9 mm SUV 2.9.  Axillary lymph nodes: Bilateral, non enlarged, right 12 mm SUV 3.6.  Abdomen/Pelvis: Non-enlarged inguinal lymph nodes, left 9 mm SUV 11.4.  Pelvis: Osseous uptake, left anterior iliac crest.      10/06/20    09/14/20      IMPRESSION and PLAN:   1. Head and neck cancer: The mass around the trachea appears to be larger, as noted by photographic documentation. There may be a component of necrosis. It is notable that the right neck adenopathy has improved. The patient has a platelet count of 66,000. This precludes treatment. He will continue radiation.    2. Hyponatremia: He has mild hyponatremia and elevated potassium. 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 He will be given IV saline and Lasix 20 mg. His magnesium is low and magnesium 400 mg daily  by G tube will be initiated.    3. The mass around the trach is growing, even though the lymph node is smaller. He would benefit from better tracheostomy care. This will be communicated with the ECF.    4. Nutrition: He remains dependent on the G tube.    During the visit, the patient became more short of breath. He was given a bronchodilator with only mild improvement. We discussed transfer to the ER which he absolutely refuses. He was seen by Dr Laban [name]. Endoscopy showed no tumor distal to the tracheostomy. There was significant dried secretions. He was placed on Levaquin. The patient will have a pulmonary lavage on return to the ECF.        PLAN:  1. IV saline.  2. Lasix 20 mg.  3. Hold chemotherapy.  4. Return 1 week, reevaluation.          Scribed for Arthur Mart, MD by Beatrix Fetters, medical scribe October 06, 2020    The documentation recorded by the scribe accurately reflects the services I personally performed and the decisions made by me. I reviewed and confirmed all material entered and/or pre-charted by the scribe.

## 2020-10-06 NOTE — Progress Notes
Pt arrived for D22C1 cisplatin. R PAC accessed. + blood return. Labs drawn and sent. Seen by Dr. Daphene Jaeger. He admits he is feeling worse and worse. Denies any pain. Communicates via phone text to talk.PLT 66. No treatment today. 1L NS ordered and given over 2 hours. Nebulizer requested per patient and administered as ordered.Upon completion pt c/o increasing dyspnea. MD called to chairside. O2 sat 100% LS CTA. Call placed to Dr. Laban Emperor for follow-up. Pt declines going to ED for further evaluation would like to go home. PAC flushed with NS and heparin and de-accessed. AVS given. Left via wheelchair with companion.

## 2020-10-07 ENCOUNTER — Ambulatory Visit: Admit: 2020-10-07 | Payer: PRIVATE HEALTH INSURANCE

## 2020-10-07 ENCOUNTER — Encounter: Admit: 2020-10-07 | Payer: PRIVATE HEALTH INSURANCE

## 2020-10-08 ENCOUNTER — Encounter: Admit: 2020-10-08 | Payer: PRIVATE HEALTH INSURANCE

## 2020-10-08 ENCOUNTER — Telehealth: Admit: 2020-10-08 | Payer: PRIVATE HEALTH INSURANCE | Attending: Hematology & Oncology

## 2020-10-08 NOTE — Telephone Encounter
Spoke to [employer] at radiation therapy.  Reports patient is currently at [facility] on telemetry but [facility] has agreed to take patient for chemotherapy and radiation (as an inpatient) therapy.COVID negative per Sam at XRT

## 2020-10-09 ENCOUNTER — Inpatient Hospital Stay: Admit: 2020-10-09 | Discharge: 2020-11-22 | Payer: MEDICARE | Admitting: Internal Medicine

## 2020-10-09 ENCOUNTER — Inpatient Hospital Stay: Admit: 2020-10-09 | Discharge: 2020-10-09 | Payer: MEDICARE

## 2020-10-09 MED ORDER — DEXTROSE 40 % ORAL GEL
40 % | ORAL | Status: DC | PRN
Start: 2020-10-09 — End: 2020-10-15

## 2020-10-09 MED ORDER — ROSUVASTATIN 10 MG TABLET
10 mg | Freq: Every day | GASTROSTOMY | Status: DC
Start: 2020-10-09 — End: 2020-10-22
  Administered 2020-10-10 – 2020-10-21 (×12): 10 mg via GASTROSTOMY

## 2020-10-09 MED ORDER — FAMOTIDINE 20 MG TABLET
20 mg | Freq: Two times a day (BID) | GASTROSTOMY | Status: DC
Start: 2020-10-09 — End: 2020-10-15
  Administered 2020-10-10 – 2020-10-15 (×12): 20 mg via GASTROSTOMY

## 2020-10-09 MED ORDER — SODIUM CHLORIDE 0.9 % (FLUSH) INJECTION SYRINGE
0.9 % | INTRAVENOUS | Status: DC | PRN
Start: 2020-10-09 — End: 2020-10-15

## 2020-10-09 MED ORDER — TRAZODONE (DESYREL) 25 MG HALFTAB
25 mg | Freq: Every evening | GASTROSTOMY | Status: DC | PRN
Start: 2020-10-09 — End: 2020-10-16
  Administered 2020-10-10 – 2020-10-11 (×2): 25 mg via GASTROSTOMY

## 2020-10-09 MED ORDER — SKIM MILK
ORAL | Status: DC | PRN
Start: 2020-10-09 — End: 2020-10-15

## 2020-10-09 MED ORDER — TRAZODONE (DESYREL) 25 MG HALFTAB
25 mg | Freq: Every evening | GASTROSTOMY | Status: DC
Start: 2020-10-09 — End: 2020-10-09

## 2020-10-09 MED ORDER — OXYCODONE IMMEDIATE RELEASE 5 MG TABLET
5 mg | Freq: Four times a day (QID) | GASTROSTOMY | Status: DC | PRN
Start: 2020-10-09 — End: 2020-10-15
  Administered 2020-10-10 – 2020-10-13 (×9): 5 mg via GASTROSTOMY

## 2020-10-09 MED ORDER — INSULIN U-100 REGULAR HUMAN 100 UNIT/ML (SLIDING SCALE)
100 unit/mL | Freq: Four times a day (QID) | SUBCUTANEOUS | Status: DC
Start: 2020-10-09 — End: 2020-10-10

## 2020-10-09 MED ORDER — OXYCODONE IMMEDIATE RELEASE 5 MG TABLET
5 mg | Freq: Four times a day (QID) | GASTROSTOMY | Status: DC | PRN
Start: 2020-10-09 — End: 2020-10-13
  Administered 2020-10-10 – 2020-10-11 (×4): 5 mg via GASTROSTOMY

## 2020-10-09 MED ORDER — INSULIN U-100 REGULAR HUMAN 100 UNIT/ML (SLIDING SCALE)
100 unit/mL | Freq: Four times a day (QID) | SUBCUTANEOUS | Status: DC
Start: 2020-10-09 — End: 2020-10-11
  Administered 2020-10-10 – 2020-10-11 (×3): 100 mL via SUBCUTANEOUS

## 2020-10-09 MED ORDER — INSULIN LISPRO 100 UNIT/ML (SLIDING SCALE)
100 unit/mL | Freq: Four times a day (QID) | SUBCUTANEOUS | Status: DC
Start: 2020-10-09 — End: 2020-10-10

## 2020-10-09 MED ORDER — HEPARIN (PORCINE) 5,000 UNIT/ML INJECTION SOLUTION
5000 unit/mL | Freq: Three times a day (TID) | SUBCUTANEOUS | Status: DC
Start: 2020-10-09 — End: 2020-10-13
  Administered 2020-10-10 – 2020-10-12 (×9): via SUBCUTANEOUS

## 2020-10-09 MED ORDER — FRUIT JUICE
ORAL | Status: DC | PRN
Start: 2020-10-09 — End: 2020-10-15

## 2020-10-09 MED ORDER — FUROSEMIDE 20 MG TABLET
20 mg | Freq: Every day | GASTROSTOMY | Status: DC
Start: 2020-10-09 — End: 2020-10-15
  Administered 2020-10-10 – 2020-10-11 (×2): 20 mg via GASTROSTOMY

## 2020-10-09 MED ORDER — LACTATED RINGERS INTRAVENOUS SOLUTION
INTRAVENOUS | Status: DC
Start: 2020-10-09 — End: 2020-10-10
  Administered 2020-10-09: 1000.000 mL/h via INTRAVENOUS

## 2020-10-09 MED ORDER — SODIUM CHLORIDE 0.9 % (FLUSH) INJECTION SYRINGE
0.9 % | Freq: Three times a day (TID) | INTRAVENOUS | Status: DC
Start: 2020-10-09 — End: 2020-10-15
  Administered 2020-10-10 – 2020-10-15 (×8): 0.9 mL via INTRAVENOUS

## 2020-10-09 MED ORDER — LORAZEPAM 0.5 MG TABLET
0.5 mg | Freq: Four times a day (QID) | GASTROSTOMY | Status: DC | PRN
Start: 2020-10-09 — End: 2020-10-19
  Administered 2020-10-09 – 2020-10-19 (×19): 0.5 mg via GASTROSTOMY

## 2020-10-09 MED ORDER — DEXTROSE 50 % IN WATER (D50W) INTRAVENOUS SYRINGE
INTRAVENOUS | Status: DC | PRN
Start: 2020-10-09 — End: 2020-10-28

## 2020-10-09 MED ORDER — ACETAMINOPHEN 325 MG/10.15 ML ORAL SUSPENSION
325 mg/10.15 mL | Freq: Four times a day (QID) | ORAL | Status: DC | PRN
Start: 2020-10-09 — End: 2020-10-16
  Administered 2020-10-16: 12:00:00 325 mL via ORAL

## 2020-10-09 MED ORDER — ALBUTEROL SULFATE 2.5 MG/3 ML (0.083 %) SOLUTION FOR NEBULIZATION
2.5 mg /3 mL (0.083 %) | Freq: Four times a day (QID) | RESPIRATORY_TRACT | Status: DC | PRN
Start: 2020-10-09 — End: 2020-10-21
  Administered 2020-10-10 – 2020-10-21 (×4): 2.5 mL via RESPIRATORY_TRACT

## 2020-10-09 MED ORDER — GLUCAGON 1 MG/ML IN STERILE WATER
Freq: Once | INTRAMUSCULAR | Status: DC | PRN
Start: 2020-10-09 — End: 2020-10-16

## 2020-10-10 ENCOUNTER — Inpatient Hospital Stay: Admit: 2020-10-10 | Payer: PRIVATE HEALTH INSURANCE

## 2020-10-10 DIAGNOSIS — J9601 Acute respiratory failure with hypoxia: Secondary | ICD-10-CM

## 2020-10-10 LAB — CBC WITH AUTO DIFFERENTIAL
BKR WAM ABSOLUTE IMMATURE GRANULOCYTES.: 0.1 x 1000/ÂµL (ref 0.00–0.30)
BKR WAM ABSOLUTE IMMATURE GRANULOCYTES.: 0.1 x 1000/ÂµL (ref 0.00–0.30)
BKR WAM ABSOLUTE LYMPHOCYTE COUNT.: 0.31 x 1000/ÂµL — ABNORMAL LOW (ref 0.60–3.70)
BKR WAM ABSOLUTE LYMPHOCYTE COUNT.: 0.54 x 1000/ÂµL — ABNORMAL LOW (ref 0.60–3.70)
BKR WAM ABSOLUTE NRBC (2 DEC): 0 x 1000/ÂµL (ref 0.00–1.00)
BKR WAM ABSOLUTE NRBC (2 DEC): 0 x 1000/ÂµL (ref 0.00–1.00)
BKR WAM ANALYZER ANC: 14.97 x 1000/ÂµL — ABNORMAL HIGH (ref 2.00–7.60)
BKR WAM ANALYZER ANC: 14.65 x 1000/ÂµL — ABNORMAL HIGH (ref 2.00–7.60)
BKR WAM BASOPHIL ABSOLUTE COUNT.: 0.01 x 1000/ÂµL (ref 0.00–1.00)
BKR WAM BASOPHIL ABSOLUTE COUNT.: 0.01 x 1000/ÂµL (ref 0.00–1.00)
BKR WAM BASOPHILS: 0.1 % (ref 0.0–1.4)
BKR WAM BASOPHILS: 0.1 % (ref 0.0–1.4)
BKR WAM EOSINOPHIL ABSOLUTE COUNT.: 0 x 1000/ÂµL (ref 0.00–1.00)
BKR WAM EOSINOPHIL ABSOLUTE COUNT.: 0.01 x 1000/ÂµL (ref 0.00–1.00)
BKR WAM EOSINOPHILS: 0 % (ref 0.0–5.0)
BKR WAM EOSINOPHILS: 0.1 % (ref 0.0–5.0)
BKR WAM HEMATOCRIT (2 DEC): 25.7 % — ABNORMAL LOW (ref 38.50–50.00)
BKR WAM HEMATOCRIT (2 DEC): 27.6 % — ABNORMAL LOW (ref 38.50–50.00)
BKR WAM HEMOGLOBIN: 8.2 g/dL — ABNORMAL LOW (ref 13.2–17.1)
BKR WAM HEMOGLOBIN: 8.9 g/dL — ABNORMAL LOW (ref 13.2–17.1)
BKR WAM IMMATURE GRANULOCYTES: 0.6 % (ref 0.0–1.0)
BKR WAM IMMATURE GRANULOCYTES: 0.6 % (ref 0.0–1.0)
BKR WAM LYMPHOCYTES: 1.9 % — ABNORMAL LOW (ref 17.0–50.0)
BKR WAM LYMPHOCYTES: 3.3 % — ABNORMAL LOW (ref 17.0–50.0)
BKR WAM MCH (PG): 27.7 pg (ref 27.0–33.0)
BKR WAM MCH (PG): 28.1 pg (ref 27.0–33.0)
BKR WAM MCHC: 31.9 g/dL (ref 31.0–36.0)
BKR WAM MCHC: 32.2 g/dL (ref 31.0–36.0)
BKR WAM MCV: 86.8 fL (ref 80.0–100.0)
BKR WAM MCV: 87.1 fL (ref 80.0–100.0)
BKR WAM MONOCYTE ABSOLUTE COUNT.: 0.84 x 1000/ÂµL (ref 0.00–1.00)
BKR WAM MONOCYTE ABSOLUTE COUNT.: 1.16 x 1000/ÂµL — ABNORMAL HIGH (ref 0.00–1.00)
BKR WAM MONOCYTES: 5.2 % (ref 4.0–12.0)
BKR WAM MONOCYTES: 7 % (ref 4.0–12.0)
BKR WAM MPV: 10.9 fL (ref 8.0–12.0)
BKR WAM MPV: 10.9 fL (ref 8.0–12.0)
BKR WAM NEUTROPHILS: 92.2 % — ABNORMAL HIGH (ref 39.0–72.0)
BKR WAM NEUTROPHILS: 88.9 % — ABNORMAL HIGH (ref 39.0–72.0)
BKR WAM NUCLEATED RED BLOOD CELLS: 0 % (ref 0.0–1.0)
BKR WAM NUCLEATED RED BLOOD CELLS: 0 % (ref 0.0–1.0)
BKR WAM PLATELETS: 149 x1000/ÂµL — ABNORMAL LOW (ref 150–420)
BKR WAM PLATELETS: 149 x1000/ÂµL — ABNORMAL LOW (ref 150–420)
BKR WAM RDW-CV: 14.3 % (ref 11.0–15.0)
BKR WAM RDW-CV: 14.5 % (ref 11.0–15.0)
BKR WAM RED BLOOD CELL COUNT.: 2.96 M/ÂµL — ABNORMAL LOW (ref 4.00–6.00)
BKR WAM RED BLOOD CELL COUNT.: 3.17 M/ÂµL — ABNORMAL LOW (ref 4.00–6.00)
BKR WAM WHITE BLOOD CELL COUNT: 16.2 x1000/ÂµL — ABNORMAL HIGH (ref 4.0–11.0)
BKR WAM WHITE BLOOD CELL COUNT: 16.5 x1000/ÂµL — ABNORMAL HIGH (ref 4.0–11.0)

## 2020-10-10 LAB — MAGNESIUM: BKR MAGNESIUM: 2 mg/dL (ref 1.7–2.4)

## 2020-10-10 LAB — BASIC METABOLIC PANEL
BKR ANION GAP: 12 (ref 7–17)
BKR ANION GAP: 10 (ref 7–17)
BKR BLOOD UREA NITROGEN: 36 mg/dL — ABNORMAL HIGH (ref 6–20)
BKR BLOOD UREA NITROGEN: 36 mg/dL — ABNORMAL HIGH (ref 6–20)
BKR BUN / CREAT RATIO: 21.8 (ref 8.0–23.0)
BKR BUN / CREAT RATIO: 21.1 (ref 8.0–23.0)
BKR CALCIUM: 9.7 mg/dL (ref 8.8–10.2)
BKR CALCIUM: 9.9 mg/dL (ref 8.8–10.2)
BKR CHLORIDE: 97 mmol/L — ABNORMAL LOW (ref 98–107)
BKR CHLORIDE: 98 mmol/L (ref 98–107)
BKR CO2: 25 mmol/L (ref 20–30)
BKR CO2: 26 mmol/L (ref 20–30)
BKR CREATININE: 1.65 mg/dL — ABNORMAL HIGH (ref 0.40–1.30)
BKR CREATININE: 1.71 mg/dL — ABNORMAL HIGH (ref 0.40–1.30)
BKR EGFR (AFR AMER): 53 mL/min/{1.73_m2} (ref >60)
BKR EGFR (AFR AMER): 51 mL/min/{1.73_m2} (ref >60)
BKR EGFR (NON AFRICAN AMERICAN): 44 mL/min/{1.73_m2} (ref >60)
BKR EGFR (NON AFRICAN AMERICAN): 42 mL/min/{1.73_m2} (ref >60)
BKR GLUCOSE: 231 mg/dL — ABNORMAL HIGH (ref 70–100)
BKR GLUCOSE: 143 mg/dL — ABNORMAL HIGH (ref 70–100)
BKR POTASSIUM: 5.5 mmol/L — ABNORMAL HIGH (ref 3.3–5.3)
BKR POTASSIUM: 4.9 mmol/L (ref 3.3–5.3)
BKR SODIUM: 134 mmol/L — ABNORMAL LOW (ref 136–144)
BKR SODIUM: 134 mmol/L — ABNORMAL LOW (ref 136–144)

## 2020-10-10 LAB — PHOSPHORUS     (BH GH L LMW YH): BKR PHOSPHORUS: 4.2 mg/dL (ref 2.2–4.5)

## 2020-10-10 LAB — ZZZMRSA BY PCR- VANCO RX (PHARMACY USE ONLY) (YH): BKR MRSA COLONIZATION STATUS PCR: DETECTED — AB

## 2020-10-10 LAB — COVID-19 CLEARANCE OR FOR PLACEMENT ONLY: BKR SARS-COV-2 RNA (COVID-19) (YH): NEGATIVE

## 2020-10-10 MED ORDER — DEXTROSE 5 % AND 0.45 % SODIUM CHLORIDE INTRAVENOUS SOLUTION
INTRAVENOUS | Status: DC
Start: 2020-10-10 — End: 2020-10-13
  Administered 2020-10-10 – 2020-10-12 (×2): 1000.000 mL/h via INTRAVENOUS

## 2020-10-10 MED ORDER — LORAZEPAM 2 MG/ML INJECTION SOLUTION
2 mg/mL | Freq: Once | INTRAVENOUS | Status: CP
Start: 2020-10-10 — End: ?
  Administered 2020-10-10: 10:00:00 2 mL via INTRAVENOUS

## 2020-10-10 MED ORDER — PIPERACILLIN-TAZOBACTAM (ZOSYN) 4.5GM MBP
Freq: Three times a day (TID) | INTRAVENOUS | Status: CP
Start: 2020-10-10 — End: ?
  Administered 2020-10-10 – 2020-10-13 (×9): 100.000 mL/h via INTRAVENOUS

## 2020-10-10 MED ORDER — SODIUM CHLORIDE 0.9 % LARGE VOLUME SYRINGE FOR AUTOINJECTOR
Freq: Once | INTRAVENOUS | Status: CP | PRN
Start: 2020-10-10 — End: ?
  Administered 2020-10-10: 20:00:00 via INTRAVENOUS

## 2020-10-10 MED ORDER — VANCOMYCIN IVPB (2 G IN 500ML NS)
INTRAVENOUS | Status: DC
Start: 2020-10-10 — End: 2020-10-15
  Administered 2020-10-11 – 2020-10-14 (×4): 500.000 mL/h via INTRAVENOUS

## 2020-10-10 MED ORDER — IOHEXOL 350 MG IODINE/ML INTRAVENOUS SOLUTION
350 mg iodine/mL | Freq: Once | INTRAVENOUS | Status: CP | PRN
Start: 2020-10-10 — End: ?
  Administered 2020-10-10: 20:00:00 350 mL via INTRAVENOUS

## 2020-10-10 MED ORDER — LORAZEPAM 2 MG/ML INJECTION SOLUTION
2 mg/mL | Freq: Once | INTRAVENOUS | Status: CP
Start: 2020-10-10 — End: ?
  Administered 2020-10-10: 07:00:00 2 mL via INTRAVENOUS

## 2020-10-10 MED ORDER — METOPROLOL TARTRATE 5 MG/5 ML IV
5 mg/ mL | Freq: Once | INTRAVENOUS | Status: CP
Start: 2020-10-10 — End: ?
  Administered 2020-10-10: 07:00:00 5 mL via INTRAVENOUS

## 2020-10-10 MED ORDER — SODIUM CHLORIDE 0.9 % INTRAVENOUS SOLUTION
INTRAVENOUS | Status: DC
Start: 2020-10-10 — End: 2020-10-10
  Administered 2020-10-10: 07:00:00 via INTRAVENOUS

## 2020-10-10 NOTE — Other
VANCOMYCIN CONSULT Current Vancomycin Order: 2 g IV every 24 hours.  Vancomycin Indication: Suspected MRSA infection - ?infected necrotic tumorRenal Function: Stable (baseline ~SCr 1.5)Scr: Creatinine (mg/dL) Date Value 16/06/9603 1.71 (H) 10/09/2020 1.65 (H) 10/06/2020 1.61 (H)  CrCl: Estimated Creatinine Clearance: 57 mL/min (A) (by C-G formula based on SCr of 1.71 mg/dL (H)).Day of Therapy: 1Planned Duration of Therapy: TBD - MRSA PCR orderedTrough Goal: 10-15Vancomycin level: Before the 4th dose of regimen d/t patient's BMI and risk for accumulation (level not yet ordered)ID/AST Consulted? NoYNHH/LMH/WH Vancomycin Dosing GuidelineBH Vancomycin Dosing GuidelineFor questions, please contact the pharmacist: Concha Pyo, PharmD         Phone/Mobile Heartbeat: [facility]

## 2020-10-10 NOTE — Other
-    CONSULT  REQUEST  DOCUMENTATION  -  [facility] NOTE  -  Type of consult: [facility]   -  New Consult: [id number] Arthur [name] /Location: 6440/6440-A / Reason for Consult? evaluation and comanagment of baseline kidney disease/Callback Cell Phone: [phone] / Please confirm receipt of this message by texting back ?OK?  -  1 - Mobile Heartbeat message sent to Schiopescu, Irina at 12:56 PM. Received response at 1257.  [name]  10/10/2020  12:56 PM  Consult [facility] [phone]

## 2020-10-10 NOTE — Other
-    CONSULT  REQUEST  DOCUMENTATION  -  [facility] NOTE  -  Type of consult: [facility]   -  New Consult: [id number] Arthur [name] /Location: 6440/6440-A / Brief Clinical Question: Pt here w/ tracheostomy tube management w/ significant home insulin requirements, requesting glucose management support/Callback Cell Phone: [phone] / Please confirm receipt of this message by texting back ?OK?  -  1 - Mobile Heartbeat message sent to [place], S at 8:11 PM. Received response at 20:11.  -  [name]  10/09/2020  8:10 PM  Consult [facility] [phone]

## 2020-10-10 NOTE — Plan of Care
Plan of Care Overview/ Patient Status    Pt arrived on unit 1700: pt is A&O x 4. MAE. Denied N/T. SBP 130-140's. Tele ST 100s-130's. Afebrile. #8 shiley trach w/ trach mask @ 28% 6L (increased to 35% 8L d/t severe abd breathing and SOB - MD aware). ETS x 1 w/ moderate malodorous, thick tan/brown secretions. Pt c/o pain with ETS. Tachypnea 20's. Peg tube clamped. LR started @ 100cc/hr. Per pt LBM 01/31. VS in urinal. T&P assist provided. ENT MD to bedside for assessment.

## 2020-10-10 NOTE — Plan of Care
Plan of Care Overview/ Patient Status        Per chart, Arthur [name] is a 55 y.o. 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 Patient was admitted to [facility] on 2/3 due to SOB and increased WOB. Patient was at his oncology outpatient clinic visit on 2/1 and exam was significant for necrotic mass surrounding the trach stoma causing significantly open wound c/f tumor encroachment ... CM has contacted pt.'s niece/Arthur [name] to discuss pt.'s prior level of function/complete assessment. Pt.'s niece [name] reports pt. was at [facility] for [employer] and doesn?t want pt. to rerutn their upon D/C & declined for CM to send a referral back to them. CM has asked pt.'s niece if she is interested in sending pt. to any other rehab facility, [name] states she would like to discuss this once pt. is approaching medical readiness. [name] further reports prior to SNF, pt. lived alone at home w/ no prior services.    Assessment screening completed. Continue to follow patient's progress and discuss plan of care with treatment team. Discharge needs most likely STR.  Care Management will continue to follow with team.     Case Management Evaluation      Most Recent Value   Case Management Evaluation and Plan   Arrived from prior to admission SNF (receiving short term rehab)  [[place]]   Admitted from: [facility]   Do you have a caregiver? No    Lives With Alone   Services Prior to Admission none, chemotherapy/radiation therapy   Patient Requires Care Coordination Intervention Due To discharge planning needs/concerns   Prior to Hospitalization: Assistance Needed/DME being used Eating   Eating Assistance/DME: feeding device, nutritional supplies  [Pt. has trach + PEG]   Documented Insurance Accurate Yes   Any financial concerns related to anticipated discharge needs No   Patient's home address verified Yes   Patient's PCP of record verified Yes   Last Date Seen by PCP 0-3 months   Living Environment    Lives With Alone   Source of Clinical History   Patient's clinical history has been reviewed and source of Information is: Medical record, Other (Comment)  [Pt.'s niece Arthur [name]]   IF Evaluation Completed by TC:   Completed Evaluation by: Name Marquis Buggy   Role: Transition Coordinator   Case Manager Attestation: Choose which ONE is appropriate for you   I have reviewed the medical record and agree with the above evaluation by the transition coordinator with the following recommendations. Yes   Discharge Planning Coordination Recommendations   Discharge Planning Coordination Recommendations STR (short term rehab at a SNF rehab unit)   Case Manager reviewed plan of care/ continuum of care need's with  Patient  [Pt.'s niece/Arthur [name]]        Norville Haggard, MSW  Care Management  Ph: [phone]

## 2020-10-10 NOTE — Plan of Care
Plan of Care Overview/ Patient Status    1900 - received bedside shift report from dayshift RN and assumed care of ptNeuro - pt is AAOx4 and complains of 2/10 pain in chest r/t coughing and trach. PRN oxy given with some effect. Pt PERRLA 3B, and afebrile. Pt is 5/5 strong and able to move himself in bed and follow commands. Pt is extremely anxious with little to no effect from PRN meds. CV - pt is ST on monitor especially when agitated. Pt SBP between 140-170 higher when anxious. Pt remains afebrile, no edema to note. S1S2. Pt PP in all extremities. Pt intermittently c/o CP, MD aware, deemed related to cough, not cardiac in nature. resp - pt is on 35% and 8L trach collar 8 shiley with an incessant cough, copious amounts of tenacious secretions, green, yellow, blood tinged. Pt c/o SOB r/t cough that progressively has gotten worse as the night went on. MD made aware on multiple occassions, see MAR for deatails on med administration and nebs. Pt deep suctioned 7-8 times through night. Breath sounds coarse in lung fields, some wheezing present. Pt never drops saturation below 95%. Plan to change trach at bedside today am.GI - pt has not had a BM. Pt passing flatus and has good BS, SNT. FS as ordered, and kept NPO except meds for procedure today.gu - pt voiding to urinal, clear yellow urine. No issues voiding.Skin - pt is strong enough to move self in bed, tolerating movement even with cough and SOB. Skin is intact. Safety - on a few occassions pt tries to get OOB, instructed that until the trach is changed he cannot do this. Pt is appropriate and aware of surroundings, using call bell and has ID band on. Bed alarm onPaul Elisha Ponder, RN

## 2020-10-10 NOTE — Progress Notes
Otolaryngology Progress Note:Arthur [name] is a 55 y.o. 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, trach/PEG transferred from [place] due to SOB w/exam significant for tumor invading the trach stoma and scope showing partial obstruction.Interim History:- Anxious overnight - Ativan required- Denies difficulty breathing, on trach mask- Pain controlledVitals:Temp:  [97.8 ?F (36.6 ?C)-99.4 ?F (37.4 ?C)] 97.8 ?F (36.6 ?C)Pulse:  [91-134] 120Resp:  [11-25] 16BP: (133-177)/(88-100) 149/92SpO2:  [95 %-98 %] 95 %Device (Oxygen Therapy): humidified;tracheostomy collarO2 Flow (L/min):  [6-8] 8I/O last 3 completed shifts:In: 865.9 [I.V.:865.9]Out: 1800 [Urine:1800]Physical Exam:Gen: Awake, alert, NADEyes: EOMIFace: Facial movements symmetric, V1-3 intactNose: clear anteriorlyOP/OC: no masses or lesions visualized, no bleeding, moist mucosaNeck: Wide open tracheal stoma w/protrusion of the Shiley 8DXLT secured w/soft trach ties, necrotic but hemostatic mass surrounding the stoma, remaining neck flat and softPulm: mild increased WOB on trach maskRecent Labs Lab 02/04/222346 02/04/222346 02/05/220511 WBC 16.2*   < > 16.5* HGB 8.2*   < > 8.9* HCT 25.70*   < > 27.60* MCV 86.8   < > 87.1 PLT 149*  --  149*  < > = values in this interval not displayed. Recent Labs Lab 02/04/222015 02/04/222346 02/04/222346 02/05/220511 02/05/220710 NA  --  134*   < > 134*  --  K  --  5.5*   < > 4.9  --  CL  --  97*   < > 98  --  CO2  --  25   < > 26  --  BUN  --  36*   < > 36*  --  CREATININE  --  1.65*   < > 1.71*  --  GLU   < > 231*   < > 143* 125* CALCIUM  --  9.7   < > 9.9  --  MG  --  2.0  --   --   --  PHOS  --  4.2  --   --   --   < > = values in this interval not displayed. No results for input(s): PTT, INR in the last 168 hours.Invalid input(s): PTRecent Labs Lab 02/01/220902 ALT 21 AST 12 BILITOT 0.2 ALKPHOS 83 ALBUMIN 3.0* Invalid input(s): GLUMETAssessment and Plan: Arthur [name] is a 55 y.o. 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

## 2020-10-10 NOTE — Utilization Review (ED)
UM Status: UR/MD agree on IP status, transferred from OSH to [facility] for higher level of care.

## 2020-10-10 NOTE — H&P
[facility] H&PAttending: Dr. Sharilyn Sites:  Terald Jump is a 55 y.o. 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 Patient was admitted to [facility] on 2/3 due to SOB and increased WOB. Patient was at his oncology outpatient clinic visit on 2/1 and exam was significant for necrotic mass surrounding the trach stoma causing significantly open wound c/f tumor encroachment. Imaging was not able to be performed due to patient intolerance of lying flat. Earlier today nurses were not able to deep suction the patient c/f obstruction. With patient c/o SOB, he was transferred to [facility] for further management.PMHx:Past Medical History: Diagnosis Date ? Diabetes mellitus (HC Code) (HC CODE)  ? Hypercholesteremia  ? Hypertension  PSHx:No past surgical history on file.Family QM:VHQION History Problem Relation Age of Onset ? Aneurysm Mother  Allergies: No Known AllergiesObjective: Vitals:Pulse:  [134] 134Resp:  [24] 24SpO2:  [97 %] 97 %Physical Exam:Gen: Awake, alert, in mild distressEyes: EOMIFace: Facial movements symmetric, V1-3 intactNose: clear anteriorlyOP/OC: no masses or lesions visualized, no bleeding, moist mucosaNeck: Wide open tracheal stoma w/protrusion of the Shiley 8DXLT secured w/soft trach ties, necrotic but hemostatic mass surrounding the stoma, remaining neck flat and softPulm: mild increased WOB on trach maskFlexible Fiberoptic Laryngoscopy A flexible fiberoptic laryngoscopy was performed.  The scope was passed through the nasal cavity. The nasopharynx appears normal. The vallecula and base of tongue appear normal. The superior epiglottis is crisp. There is an irregularity of the L AE fold, arytenoid, and the VC, however, clear visualization was not achievable due to significant secretion burden. The R AE fold, arytenoid, and VC appear grossly normal. Mobility of the VC were not able to be assessed. There is significant secretion burden obstructing the view of the hypopharynx.The scope was then passed through the tracheostomy tube. There is are mobile pale flaps right passed the distal end of the tube causing partial obstruction during respiration. Just passed this, there is left posterolateral mass that is causing a fixed partial obstruction. Passed this, the airway is patent and then carina is visualized.Labs:No results for input(s): WBC, HGB, HCT, MCV, PLT in the last 72 hours.No results for input(s): NA, K, CL, CO2, BUN, CREATININE, GLU, CALCIUM, MG, PHOS in the last 72 hours.No results for input(s): PTT, INR in the last 72 hours.Invalid input(s): PTRecent Labs Lab 02/01/220902 ALT 21 AST 12 BILITOT 0.2 ALKPHOS 83 ALBUMIN 3.0* Invalid input(s): GLUMETImaging Studies:No results found.Assessment   Satoshi Kalas is a 55 y.o. 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 Tumor encroachment onto the trachea and tracheostomy has caused stoma to further open along with protrusion of the tube. Scope through the tube was notable for partial distal obstruction but past this the airway is open. Patient will require a trach change to a Bivona but will also require med onc/rad onc management as he is still currently receiving CRT.Plan - Admit to ENT under Dr. Landis Gandy- Will plan for a trach change to a Bivona tomorrow- Pain: tylenol, oxy- Diet: NPO- Routine trach care- Continuous pulse ox- Med onc/rad onc consult- Diabetes consult- DVT ppx- If patient's respiratory status decompensates, please page ENT and TAART statKevin Russella Dar, MDPGY-III, Division of Otolaryngology-Head and Neck Surgery

## 2020-10-11 LAB — BASIC METABOLIC PANEL
BKR ANION GAP: 9 (ref 7–17)
BKR BLOOD UREA NITROGEN: 31 mg/dL — ABNORMAL HIGH (ref 6–20)
BKR BUN / CREAT RATIO: 17.8 (ref 8.0–23.0)
BKR CALCIUM: 9.8 mg/dL (ref 8.8–10.2)
BKR CHLORIDE: 101 mmol/L (ref 98–107)
BKR CO2: 26 mmol/L (ref 20–30)
BKR CREATININE: 1.74 mg/dL — ABNORMAL HIGH (ref 0.40–1.30)
BKR EGFR (AFR AMER): 50 mL/min/{1.73_m2} (ref >60)
BKR EGFR (NON AFRICAN AMERICAN): 41 mL/min/{1.73_m2} (ref >60)
BKR GLUCOSE: 163 mg/dL — ABNORMAL HIGH (ref 70–100)
BKR POTASSIUM: 4.6 mmol/L (ref 3.3–5.3)
BKR SODIUM: 136 mmol/L (ref 136–144)

## 2020-10-11 LAB — URINE MICROSCOPIC     (BH GH LMW YH)
BKR HYALINE CASTS, UA INSTRUMENT (NUMERIC): 0 /LPF (ref 0–3)
BKR RBC/HPF INSTRUMENT: 1 /HPF (ref 0–2)
BKR WBC/HPF INSTRUMENT: 1 /HPF (ref 0–5)

## 2020-10-11 LAB — URINALYSIS-MACROSCOPIC W/REFLEX MICROSCOPIC
BKR BILIRUBIN, UA: NEGATIVE
BKR GLUCOSE, UA: NEGATIVE
BKR KETONES, UA: NEGATIVE
BKR LEUKOCYTE ESTERASE, UA: NEGATIVE
BKR NITRITE, UA: NEGATIVE
BKR PH, UA: 6.5 (ref 5.5–7.5)
BKR SPECIFIC GRAVITY, UA: 1.017 (ref 1.005–1.030)
BKR UROBILINOGEN, UA: <2 EU/dL (ref <=2.0)

## 2020-10-11 LAB — CBC WITH AUTO DIFFERENTIAL
BKR WAM ABSOLUTE IMMATURE GRANULOCYTES.: 0.06 x 1000/ÂµL (ref 0.00–0.30)
BKR WAM ABSOLUTE LYMPHOCYTE COUNT.: 0.4 x 1000/ÂµL — ABNORMAL LOW (ref 0.60–3.70)
BKR WAM ABSOLUTE NRBC (2 DEC): 0 x 1000/ÂµL (ref 0.00–1.00)
BKR WAM ANALYZER ANC: 9.14 x 1000/ÂµL — ABNORMAL HIGH (ref 2.00–7.60)
BKR WAM BASOPHIL ABSOLUTE COUNT.: 0.01 x 1000/ÂµL (ref 0.00–1.00)
BKR WAM BASOPHILS: 0.1 % (ref 0.0–1.4)
BKR WAM EOSINOPHIL ABSOLUTE COUNT.: 0.13 x 1000/ÂµL (ref 0.00–1.00)
BKR WAM EOSINOPHILS: 1.2 % (ref 0.0–5.0)
BKR WAM HEMATOCRIT (2 DEC): 24.1 % — ABNORMAL LOW (ref 38.50–50.00)
BKR WAM HEMOGLOBIN: 7.9 g/dL — ABNORMAL LOW (ref 13.2–17.1)
BKR WAM IMMATURE GRANULOCYTES: 0.6 % (ref 0.0–1.0)
BKR WAM LYMPHOCYTES: 3.8 % — ABNORMAL LOW (ref 17.0–50.0)
BKR WAM MCH (PG): 27.7 pg (ref 27.0–33.0)
BKR WAM MCHC: 32.8 g/dL (ref 31.0–36.0)
BKR WAM MCV: 84.6 fL (ref 80.0–100.0)
BKR WAM MONOCYTE ABSOLUTE COUNT.: 0.83 x 1000/ÂµL (ref 0.00–1.00)
BKR WAM MONOCYTES: 7.9 % (ref 4.0–12.0)
BKR WAM MPV: 10.8 fL (ref 8.0–12.0)
BKR WAM NEUTROPHILS: 86.4 % — ABNORMAL HIGH (ref 39.0–72.0)
BKR WAM NUCLEATED RED BLOOD CELLS: 0 % (ref 0.0–1.0)
BKR WAM PLATELETS: 83 x1000/ÂµL — ABNORMAL LOW (ref 150–420)
BKR WAM RDW-CV: 14.5 % (ref 11.0–15.0)
BKR WAM RED BLOOD CELL COUNT.: 2.85 M/ÂµL — ABNORMAL LOW (ref 4.00–6.00)
BKR WAM WHITE BLOOD CELL COUNT: 10.6 x1000/ÂµL (ref 4.0–11.0)

## 2020-10-11 LAB — PROTEIN, TOTAL W/CREATININE, URINE, RANDOM     (BH GH LMW YH)
BKR CREATININE, URINE, RANDOM: 40 mg/dL
BKR PROTEIN URINE RANDOM: 0.14 g/L
BKR PROTEIN/CREATININE RATIO, URINE, RANDOM: 0.35 mg/mg{creat} — ABNORMAL HIGH (ref <0.10)

## 2020-10-11 LAB — IMMATURE PLATELET FRACTION (BH GH LMW YH)
BKR WAM IPF, ABSOLUTE: 3.7 x1000/ÂµL (ref <20.0)
BKR WAM IPF: 4.4 % (ref 1.2–8.6)

## 2020-10-11 LAB — ELECTROLYTE PANEL, URINE, RANDOM     (BH GH LMW YH)
BKR CHLORIDE, URINE, RANDOM: 104 mmol/L
BKR POTASSIUM, URINE, RANDOM: 28 mmol/L
BKR SODIUM, URINE RANDOM: 111 mmol/L

## 2020-10-11 MED ORDER — INSULIN GLARGINE 100 UNIT/ML SUBCUTANEOUS SOLUTION
100 unit/mL | Freq: Every day | SUBCUTANEOUS | Status: DC
Start: 2020-10-11 — End: 2020-10-12
  Administered 2020-10-12: 15:00:00 100 mL via SUBCUTANEOUS

## 2020-10-11 MED ORDER — INSULIN U-100 REGULAR HUMAN 100 UNIT/ML (SLIDING SCALE)
100 unit/mL | Freq: Four times a day (QID) | SUBCUTANEOUS | Status: DC
Start: 2020-10-11 — End: 2020-10-13
  Administered 2020-10-11 – 2020-10-12 (×4): 100 mL via SUBCUTANEOUS

## 2020-10-11 NOTE — Progress Notes
Brief ENT Progress NoteTAART called for sudden-onset dyspnea and desaturation to 70s. By the time of ENT arrival, tracheostomal lavage/suction had been performed with clearance of a mucus plug. Patient visibly upset but awake/alert, breathing comfortably, and with O2SAT restored to above 95. Will continue intermittent lavage/suctioning.Adelfa Koh, MD (PGY-2)Otolaryngology - Head and Neck SurgeryYale Alamed[facility] HospitalMHB: [phone] Head and Neck Cancer Floor Pager: [phone] Inpatient/Pediatric Consult Pager: [phone] [place]/ED Consult Pager: [phone] Inpatient/Consult Pager: [phone]

## 2020-10-11 NOTE — Plan of Care
Plan of Care Overview/ Patient Status    pt is A&O x 4. MAE 5/5. SBP 120-160's. Tele ST 90s-130's. Afebrile. Oxy 5/10mg  given for pain. Bivona trach w/ 28% 6L. LS diminished. O2 sat WNL on humidified trach mask w/ 28% 6L. ETS small amount of malodorous, thick tan/brown secretions. Lavaged intermittently to clear thick sputum. Peg tube clamped. D5 1/2 NS running @ 100cc/hr. (-) BM. VS in urinal - urine samples sent per order. T&P assist provided. 1019: after being weaned to 21% medical air. Pt requested to be ETS. After first pass w/ suction, thick/dry mucous plug lodged in trach, partially occluding airway. Bivona trach does not have an inner cannula to change/clean. UTA size of plug, difficulty getting plug out of trach w/ suction catheter. Pt became very anxious, dyspneic and desat into 70's. ENT/TAART called per ENT note from 02/05: If patient's respiratory status decompensates, please page ENT and TAART stat.While waiting for TAART team, pt was placed back to O2 @ 40% 8L and lavaged with 5cc NS x 2 - Able to suction/ pt w/ a strong cough: mucous plug cleared & airway stabilized. No further intervention required. PRN ativan given following for increased anxiety.

## 2020-10-11 NOTE — Other
[facility]		Medicine Consult Note Patient Data:  Patient Name: Arthur [name] Age: 55 y.o. DOB: [birth year]	 MRN: [id number]	 Consult Information: Consultation requested by: Eduard Clos, MDReason for consultation: CKD, general medical assessmentSource of Information: Patient and EMR/Previous RecordPresentation History: Arthur [name] is a 55 y.o. 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 to [facility] for surgical management of tumor invasion of trach stoma. He is currently pending exchange of trach. His course has been complicated by difficult to control DM and history of CKD with prior electrolyte disturbances.Review of prior data shows most recent Cr of 1.7 which is similar to those obtained over the past several months. He had a brief drop in sodium to a nadir of 127 on 10/06/20 but has recovered to 134. Potassium has been mildly elevated recently as high as 5.9 on 10/06/20 but normal today at 4.9. CO2 has been stable around 26. Magnesium and Phosphorous have been within acceptable ranges when measured (Mg 2 and P 4.2 on 10/09/20). His CBC has shown rising leukocytosis with WBC 16.5 today with ANC 14.6. Hemoglobin is stable at 8.9. Platelets have varied in low range but today are close to lower limit of normal at 149. Today [place] chest revealed concern for aspiration PNA and patient was started on Vanc/Zosyn in setting of positive nasal MRSA swab.On interview the patient denies any acute complaints except desire to eat. Coughing noted at bedside. Review of Allergies/Medical History/Medications: Allergies has No Known Allergies.Past Medical History  has a past medical history of Diabetes mellitus (HC Code) (HC CODE), Hypercholesteremia, and Hypertension.Past Surgical History  has no past surgical history on file.Family History family history includes Aneurysm in his mother.Social History  reports that he quit smoking about 7 years ago. He has a 30.00 pack-year smoking history. He does not have any smokeless tobacco history on file. He reports current alcohol use of about 14.0 standard drinks of alcohol per week. No history on file for drug use.Inpatient Medications:Current Facility-Administered Medications Medication Dose Route Frequency Last Rate ? acetaminophen  650 mg Oral Q6H PRN   ? albuterol  2.5 mg Nebulization Q6H PRN   ? D5 1/2 NS  100 mL/hr Intravenous Continuous 100 mL/hr (10/10/20 1730) ? dextrose (GLUCOSE) 40 % gel 15 g  15 g Oral Q15 MIN PRN    Or ? fruit juice  120 mL Oral Q15 MIN PRN    Or ? skim milk  240 mL Oral Q15 MIN PRN   ? dextrose (GLUCOSE) 40 % gel 30 g  30 g Oral Q15 MIN PRN    Or ? fruit juice  240 mL Oral Q15 MIN PRN   ? dextrose 50% in water (D50W)  12.5 g IV Push Q15 MIN PRN   ? dextrose 50% in water (D50W)  25 g IV Push Q15 MIN PRN   ? famotidine  20 mg Per G Tube BID   ? furosemide  20 mg Per G Tube Daily   ? glucagon  1 mg Intramuscular Once PRN   ? heparin (porcine)  5,000 Units Subcutaneous Q8H   ? insulin regular human   Subcutaneous Q6H   ? LORazepam  0.5 mg Per G Tube Q6H PRN   ? oxyCODONE  10 mg Per G Tube Q6H PRN   ? oxyCODONE  5 mg Per G Tube Q6H PRN   ? piperacillin-tazobactam  4.5 g Intravenous Q8H 4.5 g (10/10/20 2226) ? rosuvastatin  10 mg  Per G Tube Daily   ? sodium chloride  3 mL IV Push Q8H   ? sodium chloride  3 mL IV Push PRN for Line Care   ? traZODone  25 mg Per G Tube Nightly PRN   ? vancomycin  2 g Intravenous Q24H 2 g (10/10/20 2023) Review of Systems: Review of Systems Respiratory: Positive for cough and shortness of breath.  All other systems reviewed and are negative. Physical Exam: Vitals:Most recent: Patient Vitals for the past 24 hrs: BP Temp Temp src Pulse Resp SpO2 10/11/20 0000 128/79 -- -- (!) 100 17 96 % 10/10/20 2300 -- -- -- (!) 98 18 98 % 10/10/20 2200 135/79 -- -- (!) 100 19 96 % 10/10/20 2100 -- -- -- (!) 107 (!) 25 97 % 10/10/20 2037 -- -- -- -- -- 95 % 10/10/20 2000 (!) 151/87 -- -- (!) 97 20 97 % 10/10/20 1900 -- -- -- (!) 99 (!) 23 96 % 10/10/20 1800 137/78 -- -- (!) 100 (!) 24 95 % 10/10/20 1700 -- -- -- (!) 95 16 99 % 10/10/20 1600 -- -- -- (!) 101 16 97 % 10/10/20 1554 -- 97.2 ?F (36.2 ?C) Temporal -- -- -- 10/10/20 1500 -- -- -- (!) 102 18 94 % 10/10/20 1400 (!) 141/85 -- -- (!) 97 (!) 21 (!) 92 % 10/10/20 1200 (!) 123/96 97.1 ?F (36.2 ?C) Temporal (!) 101 (!) 31 98 % 10/10/20 1000 124/61 -- -- (!) 109 19 97 % 10/10/20 0800 (!) 169/114 97.8 ?F (36.6 ?C) Temporal (!) 119 18 (!) 92 % 10/10/20 0600 (!) 149/92 -- -- (!) 120 16 95 % 10/10/20 0500 -- -- -- (!) 116 (!) 25 98 % 10/10/20 0433 -- -- -- -- -- 96 % 10/10/20 0400 (!) 177/100 98.6 ?F (37 ?C) Oral (!) 104 (!) 11 96 % 10/10/20 0300 -- -- -- (!) 91 20 97 % 10/10/20 0200 139/88 -- -- (!) 127 18 97 % 10/10/20 0100 -- -- -- (!) 102 -- 98 % Intake/Output:Gross Totals (Last 24 hours) at 10/11/2020 0032Last data filed at 10/10/2020 2200Intake 1269.44 ml Output 2250 ml Net -980.56 ml Physical ExamConstitutional:     Appearance: He is ill-appearing. HENT:    Mouth/Throat:    Mouth: Mucous membranes are dry. Eyes:    Conjunctiva/sclera: Conjunctivae normal. Cardiovascular:    Rate and Rhythm: Tachycardia present. Pulmonary:    Effort: Pulmonary effort is normal. Abdominal:    General: There is no distension. Musculoskeletal:       General: Normal range of motion. Skin:   General: Skin is warm and dry. Neurological:    General: No focal deficit present.    Mental Status: He is alert.  Review of Labs/Diagnostics: Lab Review:I have reviewed the patient's labs within the last 24 hrs.Diagnostic Review:[place] Soft Tissue Neck w IV ContrastResult Date: 2/5/2022CT SOFT TISSUE NECK W IV CONTRAST performed on 10/10/2020 2:41 PM INDICATION: Head/neck cancer, monitor. Additional history from UJW:[id number] macroglobulinemia, T3NxMx L laryngeal SCC currently on CCRT, trach/PEG COMPARISON: None available TECHNIQUE: Contiguous axial [place] images of the neck were obtained after the administration of  40  cc of Omnipaque 350 intravenously.  FINDINGS: Status post tracheostomy. There is a large centrally necrotic ill-defined, transspatial mass centered in the larynx at the level of the thyroid cartilage. This mass is inseparable from the thyroid gland and the inferior aspects of the bilateral sternocleidomastoid muscles. There is verrucous extension superficially to the skin surface and associated skin thickening and ulceration. There is  associated nodularity of the soft tissues inferior to the tracheostomy site. Additionally there is erosion of the thyroid cartilage as well as the cricoid and arytenoid cartilages. This mass invades the stoma and extends into the proximal trachea with concentric soft tissue thickening extending to the level of the carina. There is extension posteriorly to involve the inferior hypopharynx and proximal cervical esophagus without definite erosion of the cervical vertebral bodies. Necrotic left level 3 and 4 necrotic lymphadenopathy measuring up to 8 x 6 mm and 17 x 14 mm, specifically. There is an additional central necrotic right level 2/3 lymph node measuring up to 8 x 8 mm. Additional mediastinal adenopathy, better appreciated on concurrent [place] chest examination. The visualized bilateral common and internal carotid arteries appear uninvolved. There is a crescentic filling defect identified within the right internal jugular vein (image 50-59 on series 2). The parotid and submandibular glands appear uninvolved. The visualized orbits and paranasal air sinuses are unremarkable. The visualized vertebral bodies do not show any obvious bony destruction. The visualized portion of the brain is unremarkable. Concurrent examination of the chest is dictated under a separate accession number. Large transspatial necrotic laryngeal mass with stomal invasion and involvement of the thyroid gland, trachea, hypopharynx and proximal cervical esophagus as above. Associated necrotic left level 3 and 4 lymphadenopathy as above. 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 Please see concurrently [place] chest examination for additional details regarding the chest. Report Initiated By:  Loel Lofty, MD Reported And Signed By: Remer Macho, MD  [facility] Radiology and Biomedical ImagingCT Chest w IV Contrast (eg: Empyema, Complex pleural and mediastinal masses, staging of central lung cancer)Result Date: 2/5/2022CT CHEST W IV CONTRAST  Date: 10/10/2020 2:41 PM PROVIDED INDICATION: Head/neck cancer, monitor COMPARISON STUDIES: No prior chest CTs are available for comparison.  TECHNIQUE:  A volumetric [place] acquisition of the chest was obtained from the thoracic inlet to the upper abdomen.  Coronal and sagittal reconstructed images are provided. Contrast administered:  80 cc Omnipaque 350 FINDINGS: Lungs/Airways/Pleura : Dependent consolidations bilaterally, right greater than left. Clustered centrilobular nodules in the right upper lobe in series 4 image 202 for example. Low lung volumes with dependent subsegmental basilar atelectasis. 6 mm subpleural pulmonary nodule in the right upper lobe in series 4 image 103. No pleural effusion or pneumothorax. Tracheotomy tube which bypasses the mass extends into the mid thoracic trachea. There is circumferential thickening of the trachea. Mediastinum/Lymph nodes: Partially visualized soft tissue mass in the neck in series 4 image 1 which is better characterized on the soft tissue neck [place]. There is anterior mediastinal adenopathy measuring up to 1.2 cm short axis in series 4 image 105 and subcarinal adenopathy measuring up to 2.4 cm. Normal thyroid. Heart and Vessels: Normal cardiac size.  No pericardial effusion. Normal course and caliber of the thoracic aorta. Pulmonary artery is normal size.  Mild atherosclerotic calcifications of the coronary arteries and aorta. Right-sided Mediport with catheter tip in the right atrium. Upper Abdomen: Visualized portion of the abdominal organs are unremarkable. Osseous structures and Soft Tissues: No aggressive bone lesions. Routine age-related degenerative changes throughout the thoracic spine.  Partially visualized soft tissue neck mass which is also characterized on the concurrent neck [place]. There is mediastinal adenopathy and circumferential thickening of the trachea concerning for metastatic disease. Bilateral dependent consolidations likely represent aspiration. Pulmonary nodules measuring up to 6 mm which are nonspecific but should be followed on future imaging per oncology protocol to exclude metastatic disease. This study was  reviewed by Dr. Leilani Merl. If you have any questions or concerns, call or text [phone] to speak to the radiologist. 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 Medicine is consulted for second opinion regarding management of stable CKD and fluctuating electrolytes. His CKD is stable and there are no immediately concerning findings. The patient would likely benefit from establishing with a nephrologist if he has not already (no notes in Epic). Suspect his recent electrolyte fluctuations are largely being driven by fluid and diet intake in recent days. His hyponatremia and hyperkalemia with relative alkalosis for CKD could be suggestive of adrenal insufficiency however there is no clear precipitant for this such as recent prolonged steroid exposure. His apparent pneumonia could potentially have caused SIADH induced hyponatremia however his sodium is barely in the abnormal range. Blood pressure is being adequately managed..Active Problems:  Laryngeal cancer (HC Code) (HC CODE)  SNOMED [place](R): MALIGNANT TUMOR OF LARYNX  Recommendations: - nephrology outpatient referral for CKD or inpatient consult if new AKI develops- please be sure to order renal specific diet (renal non-HD) if taking PO and consult with nutrition about optimal TF formula if restarting tube feeds to avoid hyperkalemia- continue mIVF as you are with D5 1/2 NS- appreciate endocrine input for DM management- please send sputum culture from tracheal aspirate of secretions and narrow abx from vanc/zosyn based on results - consult med onc for additional cancer specific recommendationsPatient was evaluated: In personSigned:Tarrah Furuta Tonette Lederer, MD, PhDNight Hospitalist, NEMGAvailable on Mobile HeartBeat2/02/2021 12:46 AM

## 2020-10-11 NOTE — Progress Notes
Endocrine Fellow Diabetes Management Consult Note:  Over the past 24 hours, the patient's glucose control has been within goal on SSI.     Current Nutritional Status:  NPO      Current Anti-hyperglycemic Regimen:  Regular MD SS Q6H     Home Anti-hyperglycemic Regimen:   - Toujeo U300 64U QD   - Apidra 20U TID with meals   - Metformin 1000mg  PO BID       PE:  BP (!) 141/77  - Pulse (!) 104  - Temp 99 ?F (37.2 ?C) (Oral)  - Resp 17  - Ht 6' 2 (1.88 m)  - Wt (!) 136.2 kg  - SpO2 95%  - BMI 38.55 kg/m? [name] Body mass index is 38.55 kg/m?[name].    Data:  BGs during past 48 hrs:      In Care Everywhere: A1c 8.9% in 05/2020     No results found for: HGBA1C  Creatinine (mg/dL)   Date Value   91/47/8295 1.74 (H)       Assessment:   This is a 55yo man with history of T2DM, hypertension, etoh use, laryngeal SCC s/p XRT with trache and PEG who presented yesterday evening after he was found to have an obstruction at this site of his trache. Determined to be likely tumor invasion resulting in obstruction. Endocrine was consulted for glycemic management.     Patient's BG still within goal on SSI alone. Remains NPO. No further changes for now.     Recommendations:   - Continue with Regular MD SS Q6H while NPO   - consistent carb diet   - check blood glucose qac + qhs   - if NPO hold lispro and give 80% of lantus   - if on Lantus and made NPO, consider starting D5W to prevent hypoglycemia   - please let us know if there are any changes to nutritional status or patient started/changed steroids.       All recommendations have been communicated to the primary team either in person or via MHB.     Sheryn Bison, M.D. (weekend only)   Endocrine Fellow  [facility]: [phone]  Pager: [phone]    Inpatient Diabetes Nurse Practitioner (weekdays)  Pager: [phone]

## 2020-10-11 NOTE — Plan of Care
Plan of Care Overview/ Patient Status    pt is A&O x 4. MAE - stood @ bedside contact guard. Denied N/T. SBP 120-160's. Tele ST 90s-130's. Afebrile. Oxy 5/10mg  given for pain. #8 shiley trach exchanged per ENT @ bedside to a Bivona. LS coarse. O2 sat WNL on trach mask @ 28% 6L.. ETS x 1-2 w/ small amount of malodorous, thick tan/brown secretions. Pt c/o pain with ETS. Peg tube clamped. D5 1/2 NS running @ 100cc/hr. (-) BM. VS in urinal. Down to [place] chest/neck today. T&P assist provided.

## 2020-10-11 NOTE — Other
-    CONSULT  REQUEST  DOCUMENTATION  -  [facility] NOTE  -  Type of consult: [facility]   -  New Consult: [id number] [name] /Location: 6440/6440-A / Brief Clinical Question: R IJ thrombus, metastatic cancer/Callback Cell Phone: [phone] / Please confirm receipt of this message by texting back ?OK?  -  1 - Mobile Heartbeat message sent to Marinell Blight at 8:02 AM. Received response at [id number].  [name]  10/11/2020  8:02 AM  Consult Connect Center [phone]

## 2020-10-11 NOTE — Plan of Care
Plan of Care Overview/ Patient Status    1900 - received bedside shift report and assumed care of pt Neuro - pt aaxo4, PERRLA, with 6/10 pain in chest/neck r/t trach change and excessive coughing, medicated per MAR. Pt 4-5/5 strength throughout moving self in bed. CV - SBP 130-150's SR-ST mostly in low 100's PP S1S2, no edema to note. Pt afebrile. D5 1/2 NS running at 100 ml/hrResp - pt non 28% 6L trach collar with new bivona trach as of today. Education to pt about the differences including the lack of inner cannula. Suctioning for scant tan/bloody secretions PRN at pt request. LS diminished throughout. Saturation 100%Gi - no BM on this shift, continues to be NPO except meds. Pt states he eats at facility need SLP consult for that, states he does not wear a trach cap when eating. FS ACHS.Gu - pt voiding to urinal with no issue, clear yellow.Skin - pt skin intact, moving independently in bed as needed. Safety - pt is much more relaxed now that trach has been changed, more aware of surroundings and safety precautions. Elzie Rings, RN

## 2020-10-11 NOTE — Progress Notes
[facility] Medicine Progress NoteAttending Provider: Eduard Clos, MD Subjective                                                                              Subjective: Interim History:  Patient seen and examined.Review of Allergies/Meds/Hx: Review of Allergies/Meds/Hx:I have reviewed the patient's: allergies, current scheduled medications, current infusions, current prn medications and past medical history Objective Objective: Vitals:Most recent: Patient Vitals for the past 24 hrs: BP Temp Temp src Pulse Resp SpO2 10/11/20 0935 -- -- -- (!) 104 17 95 % 10/11/20 0817 -- -- -- -- -- 97 % 10/11/20 0800 (!) 141/77 99 ?F (37.2 ?C) Oral (!) 101 (!) 24 95 % 10/11/20 0600 136/82 -- -- (!) 99 20 95 % 10/11/20 0500 -- -- -- (!) 103 (!) 24 94 % 10/11/20 0400 138/79 98.6 ?F (37 ?C) Oral (!) 102 18 95 % 10/11/20 0300 -- -- -- (!) 98 18 98 % 10/11/20 0200 (!) 148/73 -- -- (!) 98 (!) 21 98 % 10/11/20 0100 -- -- -- (!) 97 (!) 21 96 % 10/11/20 0000 128/79 98.3 ?F (36.8 ?C) Oral (!) 100 17 96 % 10/10/20 2300 -- -- -- (!) 98 18 98 % 10/10/20 2200 135/79 -- -- (!) 100 19 96 % 10/10/20 2100 -- -- -- (!) 107 (!) 25 97 % 10/10/20 2037 -- -- -- -- -- 95 % 10/10/20 2000 (!) 151/87 98.3 ?F (36.8 ?C) Axillary (!) 97 20 97 % 10/10/20 1900 -- -- -- (!) 99 (!) 23 96 % 10/10/20 1800 137/78 -- -- (!) 100 (!) 24 95 % 10/10/20 1700 -- -- -- (!) 95 16 99 % 10/10/20 1600 -- -- -- (!) 101 16 97 % 10/10/20 1554 -- 97.2 ?F (36.2 ?C) Temporal -- -- -- 10/10/20 1500 -- -- -- (!) 102 18 94 % 10/10/20 1400 (!) 141/85 -- -- (!) 97 (!) 21 (!) 92 % 10/10/20 1200 (!) 123/96 97.1 ?F (36.2 ?C) Temporal (!) 101 (!) 31 98 % I/O's:I have reviewed the patient's current I&O's as documented in the EMR.Procedures:None Physical ExamVitals and nursing note reviewed. Constitutional:     General: He is not in acute distress.   Appearance: He is well-developed. He is ill-appearing. He is not diaphoretic. HENT:    Head: Normocephalic and atraumatic.    Right Ear: External ear normal.    Left Ear: External ear normal.    Nose: Nose normal.    Mouth/Throat:    Pharynx: No oropharyngeal exudate. Eyes:    General: No scleral icterus.      Right eye: No discharge.       Left eye: No discharge.    Conjunctiva/sclera: Conjunctivae normal.    Pupils: Pupils are equal, round, and reactive to light. Neck:    Thyroid: No thyromegaly.    Vascular: No JVD. Cardiovascular:    Rate and Rhythm: Normal rate and regular rhythm.    Heart sounds: Normal heart sounds. No murmur heard. No friction rub. No gallop.  Pulmonary:    Effort: Pulmonary effort is normal. No respiratory distress.    Breath sounds: Normal breath sounds. No wheezing or rales. Chest:    Chest wall: No tenderness. Abdominal:  General: Bowel sounds are normal. There is no distension.    Palpations: Abdomen is soft.    Tenderness: There is no abdominal tenderness. There is no guarding or rebound. Musculoskeletal:       General: No tenderness. Normal range of motion.    Cervical back: Normal range of motion and neck supple. Lymphadenopathy:    Cervical: No cervical adenopathy. Skin:   Findings: No erythema or rash. Neurological:    Cranial Nerves: No cranial nerve deficit.    Motor: No abnormal muscle tone.    Coordination: Coordination normal.    Deep Tendon Reflexes: Reflexes normal. Psychiatric:       Behavior: Behavior normal.       Thought Content: Thought content normal.  Labs:Last 24 hours: Recent Results (from the past 24 hour(s)) POC Glucose (Fingerstick)  Collection Time: 10/10/20 11:06 AM Result Value Ref Range  Glucose, Meter 98 70 - 100 mg/dL MRSA by PCR- vanco RX (PHARMACY USE ONLY) [facility])  Collection Time: 10/10/20  1:27 PM  Specimen: Nares; Culture Result Value Ref Range  MRSA Colonization Status by PCR Detected (A) Not Detected POC Glucose (Fingerstick)  Collection Time: 10/10/20  3:12 PM Result Value Ref Range  Glucose, Meter 86 70 - 100 mg/dL POC Glucose (Fingerstick)  Collection Time: 10/10/20  5:03 PM Result Value Ref Range  Glucose, Meter 93 70 - 100 mg/dL POC Glucose (Fingerstick)  Collection Time: 10/10/20 11:54 PM Result Value Ref Range  Glucose, Meter 122 (H) 70 - 100 mg/dL Basic metabolic panel  Collection Time: 10/11/20  4:29 AM Result Value Ref Range  Sodium 136 136 - 144 mmol/L  Potassium 4.6 3.3 - 5.3 mmol/L  Chloride 101 98 - 107 mmol/L  CO2 26 20 - 30 mmol/L  Anion Gap 9 7 - 17  Glucose 163 (H) 70 - 100 mg/dL  BUN 31 (H) 6 - 20 mg/dL  Creatinine 1.61 (H) 0.96 - 1.30 mg/dL  Calcium 9.8 8.8 - 04.5 mg/dL  BUN/Creatinine Ratio 40.9 8.0 - 23.0  eGFR (Afr Amer) 50 >60 mL/min/1.64m2  eGFR (NON African-American) 41 >60 mL/min/1.18m2 CBC auto differential  Collection Time: 10/11/20  4:29 AM Result Value Ref Range  WBC 10.6 4.0 - 11.0 x1000/?L  RBC 2.85 (L) 4.00 - 6.00 M/?L  Hemoglobin 7.9 (L) 13.2 - 17.1 g/dL  Hematocrit 81.19 (L) 14.78 - 50.00 %  MCV 84.6 80.0 - 100.0 fL  MCH 27.7 27.0 - 33.0 pg  MCHC 32.8 31.0 - 36.0 g/dL  RDW-CV 29.5 62.1 - 30.8 %  Platelets 83 (L) 150 - 420 x1000/?L  MPV 10.8 8.0 - 12.0 fL  Neutrophils 86.4 (H) 39.0 - 72.0 %  Lymphocytes 3.8 (L) 17.0 - 50.0 %  Monocytes 7.9 4.0 - 12.0 %  Eosinophils 1.2 0.0 - 5.0 %  Basophil 0.1 0.0 - 1.4 %  Immature Granulocytes 0.6 0.0 - 1.0 %  nRBC 0.0 0.0 - 1.0 %  ANC(Abs Neutrophil Count) 9.14 (H) 2.00 - 7.60 x 1000/?L  Absolute Lymphocyte Count 0.40 (L) 0.60 - 3.70 x 1000/?L  Monocyte Absolute Count 0.83 0.00 - 1.00 x 1000/?L  Eosinophil Absolute Count 0.13 0.00 - 1.00 x 1000/?L  Basophil Absolute Count 0.01 0.00 - 1.00 x 1000/?L  Absolute Immature Granulocyte Count 0.06 0.00 - 0.30 x 1000/?L  Absolute nRBC 0.00 0.00 - 1.00 x 1000/?L Immature Platelet Fraction [facility])  Collection Time: 10/11/20  4:29 AM Result Value Ref Range  Immature Platelet Fraction 4.4 1.2 - 8.6 %  Absolute Immature  Platelet Fraction 3.7 <20.0 x1000/?L POC Glucose (Fingerstick)  Collection Time: 10/11/20  7:59 AM Result Value Ref Range  Glucose, Meter 133 (H) 70 - 100 mg/dL Diagnostics:I have reviewed the patient's Radiology report(s) within the last 48 hrs. All results are within normal limits.ECG/Tele Events: I have reviewed the patient's ECG as resulted in the EMR. Assessment Assessment: Assessment:55 year old male with past medical history of CKD stage 3, laryngeal SCC , status post treatment with cisplatin, status post trach and PEG placement , admitted to ENT service from [facility] was consulted yesterday for renal issues. Plan Plan: AKI on CKD:  Creatinine gradually worsening, likely in the setting of poor p.o. intake and sepsis.Check UA, urine lytes.Check UPEP, SPEP.Renal ultrasound ( ordered).Avoid nephrotoxins.Renally dosed medications.Hold Lasix.Continue IV fluids.Nutrition consult.  Tube feeds.Trach management:  Per primary team.Follow-up sputum cultures.Continue broad-spectrum antibiotics pending cultures.Right IJ thrombus:  Hematology to see the patient today.Will need anti coagulation vs IVC filter placement.Medicine will follow along. Electronically Signed:Dezaria Methot, MD Beeper MHB2/02/2021, 9:44 AM

## 2020-10-11 NOTE — Plan of Care
[facility]					Location: [place] [age] y.o., male				Attending: Eduard Clos, MD	Admit Date: 10/09/2020			[id number] LOS: 2 days INITIAL NUTRITION ASSESSMENT DIET ORDER:  NPOANTHROPOMETRICS:Height: 74Admit wt: 136.2kgDosing weight: 95kgBMI: 38.55Additional wt information:Ht confirmed with pt. Yes. Based on EMR wt hx, pt down 8.4kg (5.8%) over the past 3 months, not considered significant for time frame. Fluctuations in wt may be r/t fluid status vs scale variance. No edema documented in flowsheets. Furosemide ordered. Wt: 10/09/20 (!) 136.2 kg 10/06/20 (!) 139.5 kg 09/29/20 (!) 142.4 kg 09/28/20 (!) 140.6 kg 09/23/20 (!) 137.9 kg 09/22/20 (!) 137.8 kg 09/14/20 (!) 137.8 kg 09/02/20 (!) 138.4 kg 08/05/20 (!) 143.2 kg 07/15/20 (!) 144.6 kg ESTIMATED NUTRITION REQUIREMENTS:Kcal/day: 2850		(30kcal/kg)Protein/day: 162		(1.7gm/kg) Fluids/day: Fluid management per medical team discretion. 2580 mL (74mL/kcal) Needs based on: dosing wt (95kg), CA w/ mets, AKI on CKD, sepsis NUTRITION ASSESSMENT: Chart reviewed for consult re Enteral Nutrition. 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 Visited pt at bedside. Pt able to communicate via typing on phone. Pt extremely frustrated about lack of diet advancement. He reports no PO/TF since 1/31. Provided active listening and empathized with pt. Pt insisted this writer call niece to discuss advancement. Spoke with niece over the phone at length. Communicated pt and niece concerns to covering provider. Pt requesting update from team. Pt reports tolerating ice cream, jellos and yogurt PTA. He notes he was receiving Glucerna cycled overnight with bolus feeds during the day, unsure of rate. FSBG ranging 93-286 since admit, receiving insulin coverage. H/o DM noted, no HgbA1c on file. K+ 4.6, down from 5.5 on admission. Phos 4.2 on 2/4. LBM 1/31 per chart. Will provide updated TF recommendations below. PO advancement per team/SLP.Cultural/Religious/Ethnic Needs: NoFood Allergies: None per pt NUTRITION DIAGNOSIS:Inadequate oral intake related to medical status as evidenced by NPO INTERVENTIONS/RECOMMENDATIONS: Meals and Snacks:- diet advancement per team/SLPGoal:  Patient will have diet advanced or initiate nutrition support prior to next nutrition assessmentEnteral Nutrition:- If previous regimen of combined cycled/bolus feeds desired:- consider initiating Diabetisource at 15 mL/hr, advancing 15 mL/hr q6 hrs to goal of 90 mL/hr x 12 hrs overnight - initiate 1/2 carton Diabetisource, advancing by 1/2 carton per bolus until goal of 2 cartons is reached --> consider 1 carton at 10am, 2 cartons at 2pm, and 2 cartons at 6pm (5 cartons total) + 1 Prosource BID- total provides 2870 kcal, 170 gm protein, 1906 mL free water - additional free water per team. An add'l ~960 mL provides 1 mL/kcal fed - above d/w covering provider MD Izreig, awaiting response - If only bolus feeds desired, consider initiating 1/2 carton Diabetisource, advancing by 1/2 carton per bolus until goal of 2 cartons is reached --> consider 2 cartons 4x per day and 1 carton daily (9 cartons total) + ProSource BID- goal provides 2820 kcal, 165 gm protein, 1841 mL free water - additional free water per team. An add'l ~980 mL provides 1 mL/kcal fed - If K+ trends up and electrolyte-restricted formula is desired, consider initiating 1/2 Nepro, advancing by 1/2 carton per bolus until goal of 2 cartons is reached --> consider 2 cartons TID (6 cartons/day) + ProSource TID - at goal provides 2740 kcal, 160 gm protein, 1034 mL free water - additional free water per team. An add'l ~1710 mL provides 1 mL/kcal fed Goal:  Patient will advance/ tolerate goal rate TF prior to next nutrition assessment Patient will receive >90% of goal volume TF prior to next  nutrition assessment Discharge Planning and Transfer of Nutrition Care:  Nutrition related discharge needs still being determined at this time, will continue to follow  MONITORING / EVALUATION:Food/Nutrition-Related History:Food and Nutrient IntakeEnteral and Parenteral Nutrition IntakeAnthropometric Measurements:Weight Biochemical Data, Medical Tests and Procedures:Electrolyte and renal profileGlucose/endocrine profileNutrition-Focus Physical FindingsOverall findingsElectronically signed by Evaristo Bury, RD, February 6, 2022X-coverage for service Jeanella Craze, RD,  [facility]: [phone]  Please note: Nutrition is a consult only service on weekends and holidays.  Please enter a consult in EPIC if assistance is needed on a weekend or holiday or via [facility] [phone] to contact the covering RD.

## 2020-10-11 NOTE — Other
Endocrine Fellow Diabetes Management Consult Note:  This is a 55yo man with history of T2DM, hypertension, etoh use, laryngeal SCC s/p XRT with trache and PEG who presented yesterday evening after he was found to have an obstruction at this site of his trache. Determined to be likely tumor invasion resulting in obstruction. Endocrine was consulted for glycemic management. Patient's diabetes is managed by his PCP in [place]. He is currently on basal-bolus insulin along with metformin. Per chart review, it seems the patient has a history of noncompliance of medications due to a combination of financial burdens and access to healthcare. Not checking his BG at home. Diet is also restricted due to financial constraints. Over the past 24 hours, the patient's glucose control has been variable. However patient has been NPO anticipated for trache change and possible procedure. He was started on a regular insulin SS and BG is at goal this morning at 125.     Current Nutritional Status:  NPO      Current Anti-hyperglycemic Regimen:  Regular MD SS Q6H     Home Anti-hyperglycemic Regimen:   - Toujeo U300 64U QD   - Apidra 20U TID with meals   - Metformin 1000mg  PO BID       PE:  BP (!) 149/92  - Pulse (!) 120  - Temp 97.8 ?F (36.6 ?C) (Temporal)  - Resp 16  - Ht 6' 2 (1.88 m)  - Wt (!) 136.2 kg  - SpO2 95%  - BMI 38.55 kg/m? [name] Body mass index is 38.55 kg/m?[name].    Data:  BGs during past 48 hrs:      In Care Everywhere: A1c 8.9% in 05/2020     No results found for: HGBA1C  Creatinine (mg/dL)   Date Value   40/98/1191 1.71 (H)       Assessment:   This is a 55yo man with history of T2DM, hypertension, etoh use, laryngeal SCC s/p XRT with trache and PEG who presented yesterday evening after he was found to have an obstruction at this site of his trache. Determined to be likely tumor invasion resulting in obstruction. Endocrine was consulted for glycemic management.     Patient's BG is now controlled this morning on SS alone. Likely from being NPO. Unclear if patient will remain NPO for today or will be started on a diet. For now, I would recommend continuing with regular insulin SS Q6H while NPO. If he is anticipated to start nutrition, can consider starting a low dose of Lantus in the evening.     Recommendations:   - Hold Metformin   - Hold Glargine   - Continue with Regular MD SS Q6H while NPO   - consistent carb diet   - check blood glucose qac + qhs   - if NPO hold lispro and give 80% of lantus   - if on Lantus and made NPO, consider starting D5W to prevent hypoglycemia   - please let us know if there are any changes to nutritional status or patient started/changed steroids.       All recommendations have been communicated to the primary team either in person or via [facility].     Sheryn Bison, M.D. (weekend only)   Endocrine Fellow  [facility]: [phone]  Pager: [phone]    Inpatient Diabetes Nurse Practitioner (weekdays)  Pager: [phone]

## 2020-10-12 ENCOUNTER — Ambulatory Visit: Admit: 2020-10-12 | Payer: PRIVATE HEALTH INSURANCE | Attending: Medical Oncology

## 2020-10-12 ENCOUNTER — Inpatient Hospital Stay: Admit: 2020-10-12 | Payer: PRIVATE HEALTH INSURANCE

## 2020-10-12 ENCOUNTER — Ambulatory Visit: Admit: 2020-10-12 | Payer: PRIVATE HEALTH INSURANCE

## 2020-10-12 ENCOUNTER — Ambulatory Visit: Admit: 2020-10-12 | Payer: PRIVATE HEALTH INSURANCE | Attending: Radiation Oncology

## 2020-10-12 ENCOUNTER — Encounter: Admit: 2020-10-12 | Payer: PRIVATE HEALTH INSURANCE | Attending: Radiation Oncology

## 2020-10-12 DIAGNOSIS — C329 Malignant neoplasm of larynx, unspecified: Secondary | ICD-10-CM

## 2020-10-12 DIAGNOSIS — J9601 Acute respiratory failure with hypoxia: Secondary | ICD-10-CM

## 2020-10-12 LAB — CBC WITH AUTO DIFFERENTIAL
BKR WAM ABSOLUTE IMMATURE GRANULOCYTES.: 0.05 x 1000/ÂµL (ref 0.00–0.30)
BKR WAM ABSOLUTE LYMPHOCYTE COUNT.: 0.41 x 1000/ÂµL — ABNORMAL LOW (ref 0.60–3.70)
BKR WAM ABSOLUTE NRBC (2 DEC): 0 x 1000/ÂµL (ref 0.00–1.00)
BKR WAM ANALYZER ANC: 6.8 x 1000/ÂµL (ref 2.00–7.60)
BKR WAM BASOPHIL ABSOLUTE COUNT.: 0.01 x 1000/ÂµL (ref 0.00–1.00)
BKR WAM BASOPHILS: 0.1 % (ref 0.0–1.4)
BKR WAM EOSINOPHIL ABSOLUTE COUNT.: 0.24 x 1000/ÂµL (ref 0.00–1.00)
BKR WAM EOSINOPHILS: 3 % (ref 0.0–5.0)
BKR WAM HEMATOCRIT (2 DEC): 23.2 % — ABNORMAL LOW (ref 38.50–50.00)
BKR WAM HEMOGLOBIN: 7.5 g/dL — ABNORMAL LOW (ref 13.2–17.1)
BKR WAM IMMATURE GRANULOCYTES: 0.6 % (ref 0.0–1.0)
BKR WAM LYMPHOCYTES: 5 % — ABNORMAL LOW (ref 17.0–50.0)
BKR WAM MCH (PG): 27.9 pg (ref 27.0–33.0)
BKR WAM MCHC: 32.3 g/dL (ref 31.0–36.0)
BKR WAM MCV: 86.2 fL (ref 80.0–100.0)
BKR WAM MONOCYTE ABSOLUTE COUNT.: 0.62 x 1000/ÂµL (ref 0.00–1.00)
BKR WAM MONOCYTES: 7.6 % (ref 4.0–12.0)
BKR WAM MPV: 11.2 fL (ref 8.0–12.0)
BKR WAM NEUTROPHILS: 83.7 % — ABNORMAL HIGH (ref 39.0–72.0)
BKR WAM NUCLEATED RED BLOOD CELLS: 0 % (ref 0.0–1.0)
BKR WAM PLATELETS: 54 x1000/ÂµL — ABNORMAL LOW (ref 150–420)
BKR WAM RDW-CV: 14.6 % (ref 11.0–15.0)
BKR WAM RED BLOOD CELL COUNT.: 2.69 M/ÂµL — ABNORMAL LOW (ref 4.00–6.00)
BKR WAM WHITE BLOOD CELL COUNT: 8.1 x1000/ÂµL (ref 4.0–11.0)

## 2020-10-12 LAB — IMMATURE PLATELET FRACTION (BH GH LMW YH)
BKR WAM IPF, ABSOLUTE: 2.3 x1000/ÂµL (ref <20.0)
BKR WAM IPF: 4.2 % (ref 1.2–8.6)

## 2020-10-12 LAB — PROTEIN ELECTROPHORESIS, SERUM    (YH)
BKR ALBUMIN ELP: 2.59 g/dL — ABNORMAL LOW (ref 3.50–4.70)
BKR ALPHA-1-GLOBULIN: 0.25 g/dL (ref 0.10–0.30)
BKR ALPHA-2-GLOBULIN: 0.78 g/dL (ref 0.60–1.00)
BKR BETA GLOBULIN: 0.54 g/dL — ABNORMAL LOW (ref 0.70–1.20)
BKR GAMMA GLOBULIN: 0.33 g/dL — ABNORMAL LOW (ref 0.70–1.50)

## 2020-10-12 LAB — URINE CULTURE: BKR URINE CULTURE, ROUTINE: NO GROWTH

## 2020-10-12 LAB — HEMOGLOBIN A1C
BKR ESTIMATED AVERAGE GLUCOSE: 212 mg/dL
BKR HEMOGLOBIN A1C: 9 % — ABNORMAL HIGH (ref 4.0–5.6)

## 2020-10-12 LAB — BASIC METABOLIC PANEL
BKR ANION GAP: 9 (ref 7–17)
BKR BLOOD UREA NITROGEN: 26 mg/dL — ABNORMAL HIGH (ref 6–20)
BKR BUN / CREAT RATIO: 15.1 (ref 8.0–23.0)
BKR CALCIUM: 9.5 mg/dL (ref 8.8–10.2)
BKR CHLORIDE: 101 mmol/L (ref 98–107)
BKR CO2: 27 mmol/L (ref 20–30)
BKR CREATININE: 1.72 mg/dL — ABNORMAL HIGH (ref 0.40–1.30)
BKR EGFR (AFR AMER): 50 mL/min/{1.73_m2} (ref >60)
BKR EGFR (NON AFRICAN AMERICAN): 41 mL/min/{1.73_m2} (ref >60)
BKR GLUCOSE: 203 mg/dL — ABNORMAL HIGH (ref 70–100)
BKR POTASSIUM: 4.1 mmol/L (ref 3.3–5.3)
BKR SODIUM: 137 mmol/L (ref 136–144)

## 2020-10-12 LAB — TOTAL PROTEIN AND ALBUMIN,SERUM  (YH)
BKR ALBUMIN, SERUM: 2.6 g/dL — ABNORMAL LOW (ref 3.6–4.9)
BKR TOTAL PROTEIN, SERUM: 4.5 g/dL — ABNORMAL LOW (ref 6.0–8.3)

## 2020-10-12 MED ORDER — VANCOMYCIN MAR LEVEL
Freq: Once | INTRAVENOUS | Status: CP
Start: 2020-10-12 — End: ?

## 2020-10-12 MED ORDER — INSULIN GLARGINE 100 UNIT/ML SUBCUTANEOUS SOLUTION
100 unit/mL | Freq: Every day | SUBCUTANEOUS | Status: DC
Start: 2020-10-12 — End: 2020-10-13

## 2020-10-12 MED ORDER — VANCOMYCIN MAR LEVEL
Freq: Once | INTRAVENOUS | Status: DC
Start: 2020-10-12 — End: 2020-10-12

## 2020-10-12 MED ORDER — OXYMETAZOLINE 0.05 % NASAL SPRAY
0.05 % | Freq: Two times a day (BID) | NASAL | Status: DC | PRN
Start: 2020-10-12 — End: 2020-10-21

## 2020-10-12 MED ORDER — HEPARIN 25,000 UNIT/250 0.45 % NS (HIGH RISK OF BLEEDING-ALL SITES)
25000 unit/250 mL | INTRAVENOUS | Status: DC
Start: 2020-10-12 — End: 2020-10-15
  Administered 2020-10-13: 06:00:00 via INTRAVENOUS

## 2020-10-12 NOTE — Other
PHARMACY-ASSISTED MEDICATION REPORT    Pharmacist review of the best possible medication history obtained by the pharmacy medication history technician has been performed.      I have updated the home medication list and identified the following information that may be relevant to this admission.      NOTES/RECOMMENDATIONS   Lisinopril and metformin held. No recommendations.                        Prior to Admission Medications     Medication Name Sig Taking? Patient Reported       albuterol (PROVENTIL, VENTOLIN) 2.5 mg /3 mL (0.083 %) nebulizer solution  Last dose:  --  Take 3 mLs by nebulization daily.   Yes       atorvastatin (LIPITOR) 20 mg tablet  Last dose:  --  TAKE 1 TABLET BY MOUTH ONCE DAILY Yes Yes       azelastine (ASTELIN) 137 mcg (0.1 %) nasal spray  Last dose: Not Taking at Unknown time Use 2 sprays in each nostril every 8 (eight) hours as needed for rhinitis. Use in each nostril as directed  Patient not taking: Reported on 10/11/2020   Yes       famotidine (PEPCID) 20 mg tablet  Last dose:  --  Take 20 mg by mouth 2 (two) times daily. Yes Yes       furosemide (LASIX) 20 mg tablet  Last dose:  --  1 tablet (20 mg total) by Per G Tube route daily. Yes         insulin aspart U-100 (NOVOLOG) 100 unit/mL vial  Last dose:  --  Inject under the skin 3 (three) times daily before meals. 8 units before breakfast and lunch, 14 units before dinner Yes Yes       insulin glargine,hum.rec.anlog (TOUJEO SOLOSTAR U-300 INSULIN SUBQ)  Last dose:  --  Inject 62 Units under the skin daily.  Yes Yes       lisinopriL (PRINIVIL,ZESTRIL) 10 mg tablet  Last dose:  --  TAKE 1 TABLET BY MOUTH ONCE DAILY Yes Yes       LORazepam (ATIVAN) 0.5 mg tablet  Last dose: Past Month at Unknown time Take 0.5 mg by mouth every 6 (six) hours as needed for anxiety. Yes Yes       magnesium oxide (MAGOX) 400 mg (241.3 mg magnesium) tablet  Last dose: More than a month at Unknown time 1 tablet (400 mg total) by Per G Tube route daily. metFORMIN (GLUCOPHAGE) 1000 mg tablet  Last dose:  --  Take 1,000 mg by mouth daily.  Yes Yes       oxyCODONE (ROXICODONE) 5 mg Immediate Release tablet  Last dose: Unknown at Unknown time Take 5 mg by mouth every 4 (four) hours as needed.   Yes       polyethylene glycol (MIRALAX) 17 gram packet  Last dose: Not Taking at Unknown time Take 17 g by mouth daily. Mix in 8 ounces of water, juice, soda, coffee or tea prior to taking.  Patient not taking: Reported on 10/11/2020   Yes       traZODone (DESYREL) 50 mg tablet  Last dose: Not Taking at Unknown time Take 25 mg by mouth nightly.  Patient not taking: Reported on 10/11/2020   Yes       Prior to admission medications last reviewed by Rica Mast, PharmD on Mon Oct 12, 2020 1337  Thank you,  Rica Mast, PharmD  10/12/2020  1:39 PM  Phone: [facility]

## 2020-10-12 NOTE — Plan of Care
Plan of Care Overview/ Patient Status       Alert, oriented, highly  anxious. Non verbal 2/2 trach, conveys  needs well, using  cell phone. Move  all extrem  well, HOB elevated  at all  times. Bivona  trach  in place ,ETS,  copious  thick creamy  blood tinged  secretion, lavaged  done  with RT, obtained  moderate  amount. O2 up 40% FIO2  at 6L 2/2 /o dyspnea. Satting  >95%. TF  started via  PEG  advancing  rate  to  goal 90cc/h  IVF  cont. Titrate  as  TF to goal. Voids  spont.  Using  urinal  Oxy  for  pain with  good  effect,

## 2020-10-12 NOTE — Transfer Summaries
Arthur [name] is a 55 y.o. 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 On 2/5, his trach was changed to a Bivona, which bypasses the obstruction. On 2/6, TAART was called 2/2 mucous plugging, which was cleared by tracheostomal lavage/suction at bedside. On 2/7, a bedside biopsy was performed for tumor profiling and path.# laryngeal cancer- Radiation oncology consulted on 2/7.- Follow up path from 2/7 with tumor profiling- vanc/zosyn started for empiric coverage of possible super-imposed infection of tumor given leukocytosis on admission which has improved; continuation of abx per medical teams# IJ thrombus- No e/o DVT in upper or lower extremities per [name] 2/7, but does have Superficial venous thrombosis within the right basilic cubital vein at the antecubital fossa- Hematology involved with recommendations for anticoagulation- Ok to anticoagulate from ENT perspective. Would recommend being monitored for bleeding at a therapeutic level while still inpatient# diabetes, diet- Consult was placed to diabetes team for glycemic control. Most recent recs include holding glargine. Mid-dose insulin sliding scale while on tube feeds. - Diet is by tube feeds per G-tube. Plan to advance to goal for 12 hours then transition to bolus feeds.# acute on chronic renal disease- Renal US 2/7 without hydronephrosis- Lasix and lisinopril being held- Treatment per medical teamsENT will continue to follow the patient while inpatient and be available for help as needed. There are acute surgical needs at this time.Rob Bunting, MD (PGY-1)Otolaryngology- Head and Neck SurgeryY[facility]             ------------Admitted Inpatients: Contact me by [facility] Inpatient Consults: [phone] ED Consults: [phone]

## 2020-10-12 NOTE — Other
-    CONSULT  REQUEST  DOCUMENTATION  -  [facility] NOTE  -  Type of consult: [facility] Oncology   -  New Consult: [id number] Arthur [name] /Location: 6440/6440-A / Patient status: New consultReason for Consult? 55 yo with laryngeal cancer extending to skin; not a surgical candidate, on weekly cisplatin with Dr. Daphene Jaeger; please evaluate for systemic therapy and transfer/Callback Cell Phone: [phone] / Please confirm receipt of this message by texting back ?OK?  -  1 - Mobile Heartbeat message sent to Jinny Sanders at 9:57 AM. Received response at [phone].  [name]  10/12/2020  9:57 AM  Consult Connect Center [phone]

## 2020-10-12 NOTE — Progress Notes
[facility] Medicine Progress NoteAttending Provider: Alric Quan, MD Subjective                                                                              Subjective: Interim History:  Patient denies any pain, SOB, nausea.Review of Allergies/Meds/Hx: Review of Allergies/Meds/Hx:I have reviewed the patient's: current scheduled medications, current infusions and current prn medications Objective Objective: Vitals:Last 24 hours: Temp:  [98.5 ?F (36.9 ?C)-99.1 ?F (37.3 ?C)] 99.1 ?F (37.3 ?C)Pulse:  [86-114] 114Resp:  [12-31] 27BP: (131-155)/(68-92) 142/68SpO2:  [93 %-98 %] 95 %I/O's:Gross Totals (Last 24 hours) at 10/12/2020 1230Last data filed at 10/12/2020 1200Intake 5405 ml Output 1200 ml Net 4205 ml Physical ExamVitals reviewed. Constitutional:     General: He is not in acute distress.HENT:    Mouth/Throat:    Mouth: Mucous membranes are moist. Neck:    Comments: Trach in place; mass surrounding the stoma.  No bleedingCardiovascular:    Rate and Rhythm: Normal rate and regular rhythm. Pulmonary:    Effort: Pulmonary effort is normal.    Breath sounds: Normal breath sounds. Abdominal:    General: Bowel sounds are normal.    Palpations: Abdomen is soft.    Tenderness: There is no abdominal tenderness.    Comments: PEG intact Genitourinary:   Comments: No FoleyMusculoskeletal:       General: No tenderness. Skin:   General: Skin is warm and dry. Neurological:    Mental Status: He is alert. Mental status is at baseline. Psychiatric:       Mood and Affect: Mood normal.  Labs:I have reviewed the patient's labs within the last 24 hrs.Assessment: Assessment: 54y/o M w/ pmhx Waldenstrom macroglobulinemia, CKD stage 3, laryngeal SCC diagnosed 06/2020, treated with concurrent cisplatin + RT, s/p trach and PEG, HTN, DM, admitted for trach management by ENT.Plan: Acute on CKD stage III--Baseline creatinine appears to be 1.5-1.6Suspect mild rise in creatinine related to volume shifting and less likely new intrinsic renal causeFENA 3.5 (not reliable in CKD)Awaiting renal ultrasoundContinue to avoid nephrotoxic meds and contrastContinue maintenance IV fluids until tube feedings at goalHold Lasix for additional 24 hours and reassess dailyHold lisinoprilDM--FS glucose mainly below 200, acceptable while active titration of tube feedingRecommend increasing Lantus to 50 units daily (home dose 62 units)Hold metformin as you areInsulin SSHTN--Hold lisinopril and LasixDVT PPX--Heparin sc (Hematology recommendations noted regarding further evaluation/management of R IJ thrombus)Electronically Signed:Kayden Amend S Lounell Schumacher, [phone]/03/2021, 12:29 PM

## 2020-10-12 NOTE — Progress Notes
Inpatient Diabetes Management Team Follow-up Note    Over the past 24 hours, the patient's glucose control has been variable but close to goal     ? Current Nutritional Status:  Diabetisource @ 45 ml/hr with goal rate 90 ml/hr   ? Current Anti-hyperglycemic Regimen:  glargine 45 unis q am, regular insulin mid dose sliding scale q 6 hours  ? Home Anti-hyperglycemic Regimen: Toujeo U300 64 units daily, Apidra 20 units TID with meals, metformin 1000 mg bid   ? Other Contributing Medications: D51/2NS @ 100 cc/hr    PE:    Temp:  [98.5 ?F (36.9 ?C)-99.1 ?F (37.3 ?C)] 99.1 ?F (37.3 ?C)  Pulse:  [86-111] 88  Resp:  [12-31] 16  BP: (123-155)/(71-92) 146/80  SpO2:  [93 %-98 %] 97 %  Device (Oxygen Therapy): heated;humidified;tracheostomy collar  O2 Flow (L/min):  [6-15] 6     Wt Readings from Last 3 Encounters:   10/09/20 (!) 136.2 kg   10/06/20 (!) 139.5 kg   09/29/20 (!) 142.4 kg       Data Review:  BGs:      Creatinine (mg/dL)   Date Value   45/40/9811 1.72 (H)     Hemoglobin A1c (%)   Date Value   10/10/2020 9.0 (H)       Assessment:  55 yo h/o type 2 diabetes, HTN, Waldenstroms macroglobulinemia, ETOH, laryngeal SCC s/p trach/PEG presented with SOB significant for tumor invading the trach stoma with partial obstruction.  Diabetes team consulted for assistance in glycemic management.      BG over the past 24 hours have been variable.  He was started on continuous tube feeds last night and maintained on dextrose infusion.  He was started on glargine this am by primary team in anticipation of increasing his tube feeds.  His overall insulin requirements while NPO are minimal supporting his hyperglycemia is secondary to insulin resistance in the setting of tube feeds.  Would recommend to continue the dextrose infusion as his tube feeds are not at goal and he received glargine this am. Will need to clarify with team the plan for tube feeds to determine what his insulin needs will be.  He will require standing insulin for the tube feeds once we determine what the plan will be.  Discussed plan with covering provider, Dr. Rob Bunting.  Based on current AACE/ADA guidelines, the goal is to maintain all BGs<180 mg/dl, ideally with pre-prandial BGs<140 mg/dl.    Recommendations:   1. HOLD glargine 45 units q am   2. Regular insulin mid dose sliding scale q 6 hours   3. Continue blood glucose monitoring q 6 hours  4. Diet: tube feeds per nutrition       Please let us know if you have any questions or concerns about our recommenations. We will continue to follow the patient along with you during his hospitalzation as we attempt to optimize his glycemic control.  Detailed recommendations were communicated to the primary team both verbally and/or through this written note.  I spent a total of 25 minutes with the patient of which 15 minutes (over 50%) was spent in counseling the patient regarding diabetes management and coordinating care with the primary team.     Signed:  Yevonne Aline PA   Inpatient Diabetes Management Team   [facility]: [phone]  Diabetes consult pager # [phone]      NOTE: During nights, weekends, and holidays, please contact the on-call Endocrine Fellow at pager [phone].

## 2020-10-12 NOTE — Other
-    CONSULT  REQUEST  DOCUMENTATION  -  [facility]CT [facility]ENTER NOTE  -  Type of consult: [facility] Radiation Oncology   -  New Consult: [id number] [name] /Location: 6440/6440-A / Brief Clinical Question: 54yo with laryngeal cancer extending through skin, not a surgical candidate, receiving radiation; evaluate for radiation therapy/Callback Cell Phone: [phone] / Please confirm receipt of this message by texting back ?OK?  -  3 - Radiation Oncology [phone] called at 9:58 AM, spoke with Alycia .  [name]  10/12/2020  9:58 AM  Consult Connect Center [phone]

## 2020-10-12 NOTE — Progress Notes
Procedure: Biopsy left neck massInformed consent obtained (in media tab). Neck mass injected with lidocaine 1:100,000 epinephrine. Punch biopsy kit was used to obtain specimen. Minimal bleeding. Silver nitrate used for hemostasis with success. Patient tolerated procedure well. Rob Bunting, MD (PGY-1)Otolaryngology- Head and Neck SurgeryY[facility]             ------------Admitted Inpatients: Contact me by [facility] Inpatient Consults: [phone] ED Consults: [phone]

## 2020-10-12 NOTE — Progress Notes
Otolaryngology Progress Note:Arthur [name] is a 55 y.o. 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 R IJ thrombosis. - [place] chest with mediastinal LAD, bl consolidations c/f aspirationVitals:Temp:  [98.5 ?F (36.9 ?C)-99 ?F (37.2 ?C)] 98.9 ?F (37.2 ?C)Pulse:  [90-111] 90Resp:  [12-31] 20BP: (123-164)/(71-92) 146/71SpO2:  [93 %-98 %] 95 %Device (Oxygen Therapy): heated;humidified;tracheostomy collarO2 Flow (L/min):  [3-15] 15I/O last 3 completed shifts:In: 705 [NG/GT:105; IV Piggyback:600]Out: 2900 [Urine:2900]Physical Exam:Gen: Awake, alert, NADEyes: EOMIFace: Facial movements symmetric, V1-3 intactNose: clear anteriorlyOP/OC: no masses or lesions visualized, no bleeding, moist mucosaNeck: Wide open tracheal stoma w/protrusion of the Bivona 8 cuffed secured at 11 w/soft trach ties, necrotic but hemostatic mass surrounding the stoma, remaining neck flat and softPulm: normal WOB on trach maskRecent Labs Lab 02/06/220429 02/06/220429 02/07/220140 WBC 10.6   < > 8.1 HGB 7.9*   < > 7.5* HCT 24.10*   < > 23.20* MCV 84.6   < > 86.2 PLT 83*  --  54*  < > = values in this interval not displayed. Recent Labs Lab 02/04/222346 02/05/220511 02/06/222315 02/07/220140 02/07/220614 NA 134*   < >  --  137  --  K 5.5*   < >  --  4.1  --  CL 97*   < >  --  101  --  CO2 25   < >  --  27  --  BUN 36*   < >  --  26*  --  CREATININE 1.65*   < >  --  1.72*  --  GLU 231* < >   < > 203* 227* CALCIUM 9.7   < >  --  9.5  --  MG 2.0  --   --   --   --  PHOS 4.2  --   --   --   --   < > = values in this interval not displayed. No results for input(s): PTT, INR in the last 168 hours.Invalid input(s): PTRecent Labs Lab 02/01/220902 02/07/220140 ALT 21  --  AST 12  --  BILITOT 0.2  --  ALKPHOS 83  --  ALBUMIN 3.0* 2.6* Invalid input(s): GLUMETAssessment and Plan: Arthur [name] is a 55 y.o. 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 Consider AC vs IVC filter- Will discuss with med onc/rad onc today - patient is still undergoing therapy- Monitor Cr, electrolytes, BG- Appreciate medicine and diabetes team recs - DVT ppxJanet Saddie Benders, MDPGY IV - Division of Otolaryngology-Head and Neck Surgery

## 2020-10-13 ENCOUNTER — Encounter: Admit: 2020-10-13 | Payer: PRIVATE HEALTH INSURANCE

## 2020-10-13 ENCOUNTER — Encounter: Admit: 2020-10-13 | Payer: PRIVATE HEALTH INSURANCE | Attending: Radiation Oncology

## 2020-10-13 ENCOUNTER — Ambulatory Visit: Admit: 2020-10-13 | Payer: PRIVATE HEALTH INSURANCE

## 2020-10-13 ENCOUNTER — Inpatient Hospital Stay: Admit: 2020-10-13 | Payer: PRIVATE HEALTH INSURANCE

## 2020-10-13 DIAGNOSIS — J9601 Acute respiratory failure with hypoxia: Secondary | ICD-10-CM

## 2020-10-13 LAB — CBC WITHOUT DIFFERENTIAL
BKR WAM ANALYZER ANC: 5.96 x 1000/ÂµL (ref 2.00–7.60)
BKR WAM HEMATOCRIT (2 DEC): 26.6 % — ABNORMAL LOW (ref 38.50–50.00)
BKR WAM HEMOGLOBIN: 8.3 g/dL — ABNORMAL LOW (ref 13.2–17.1)
BKR WAM MCH (PG): 27.3 pg (ref 27.0–33.0)
BKR WAM MCHC: 31.2 g/dL (ref 31.0–36.0)
BKR WAM MCV: 87.5 fL (ref 80.0–100.0)
BKR WAM MPV: 11.9 fL (ref 8.0–12.0)
BKR WAM PLATELETS: 42 x1000/ÂµL — ABNORMAL LOW (ref 150–420)
BKR WAM RDW-CV: 14.3 % (ref 11.0–15.0)
BKR WAM RED BLOOD CELL COUNT.: 3.04 M/ÂµL — ABNORMAL LOW (ref 4.00–6.00)
BKR WAM WHITE BLOOD CELL COUNT: 7.4 x1000/ÂµL (ref 4.0–11.0)

## 2020-10-13 LAB — CBC WITH AUTO DIFFERENTIAL
BKR WAM ABSOLUTE IMMATURE GRANULOCYTES.: 0.03 x 1000/ÂµL (ref 0.00–0.30)
BKR WAM ABSOLUTE LYMPHOCYTE COUNT.: 0.34 x 1000/ÂµL — ABNORMAL LOW (ref 0.60–3.70)
BKR WAM ABSOLUTE NRBC (2 DEC): 0 x 1000/ÂµL (ref 0.00–1.00)
BKR WAM ANALYZER ANC: 5.88 x 1000/ÂµL (ref 2.00–7.60)
BKR WAM BASOPHIL ABSOLUTE COUNT.: 0.01 x 1000/ÂµL (ref 0.00–1.00)
BKR WAM BASOPHILS: 0.1 % (ref 0.0–1.4)
BKR WAM EOSINOPHIL ABSOLUTE COUNT.: 0.36 x 1000/ÂµL (ref 0.00–1.00)
BKR WAM EOSINOPHILS: 5 % (ref 0.0–5.0)
BKR WAM HEMATOCRIT (2 DEC): 24.1 % — ABNORMAL LOW (ref 38.50–50.00)
BKR WAM HEMOGLOBIN: 7.8 g/dL — ABNORMAL LOW (ref 13.2–17.1)
BKR WAM IMMATURE GRANULOCYTES: 0.4 % (ref 0.0–1.0)
BKR WAM LYMPHOCYTES: 4.7 % — ABNORMAL LOW (ref 17.0–50.0)
BKR WAM MCH (PG): 27.8 pg (ref 27.0–33.0)
BKR WAM MCHC: 32.4 g/dL (ref 31.0–36.0)
BKR WAM MCV: 85.8 fL (ref 80.0–100.0)
BKR WAM MONOCYTE ABSOLUTE COUNT.: 0.59 x 1000/ÂµL (ref 0.00–1.00)
BKR WAM MONOCYTES: 8.2 % (ref 4.0–12.0)
BKR WAM MPV: 11.4 fL (ref 8.0–12.0)
BKR WAM NEUTROPHILS: 81.6 % — ABNORMAL HIGH (ref 39.0–72.0)
BKR WAM NUCLEATED RED BLOOD CELLS: 0 % (ref 0.0–1.0)
BKR WAM PLATELETS: 40 x1000/ÂµL — ABNORMAL LOW (ref 150–420)
BKR WAM RDW-CV: 14.1 % (ref 11.0–15.0)
BKR WAM RED BLOOD CELL COUNT.: 2.81 M/ÂµL — ABNORMAL LOW (ref 4.00–6.00)
BKR WAM WHITE BLOOD CELL COUNT: 7.2 x1000/ÂµL (ref 4.0–11.0)

## 2020-10-13 LAB — URINALYSIS-MACROSCOPIC W/REFLEX MICROSCOPIC
BKR BILIRUBIN, UA: NEGATIVE
BKR BLOOD, UA: NEGATIVE
BKR GLUCOSE, UA: NEGATIVE
BKR KETONES, UA: NEGATIVE
BKR LEUKOCYTE ESTERASE, UA: NEGATIVE
BKR NITRITE, UA: NEGATIVE
BKR PH, UA: 6 (ref 5.5–7.5)
BKR SPECIFIC GRAVITY, UA: 1.018 (ref 1.005–1.030)
BKR UROBILINOGEN, UA: <2 EU/dL (ref <=2.0)

## 2020-10-13 LAB — BASIC METABOLIC PANEL
BKR ANION GAP: 10 (ref 7–17)
BKR BLOOD UREA NITROGEN: 23 mg/dL — ABNORMAL HIGH (ref 6–20)
BKR BUN / CREAT RATIO: 14.3 (ref 8.0–23.0)
BKR CALCIUM: 9.5 mg/dL (ref 8.8–10.2)
BKR CHLORIDE: 100 mmol/L (ref 98–107)
BKR CO2: 27 mmol/L (ref 20–30)
BKR CREATININE: 1.61 mg/dL — ABNORMAL HIGH (ref 0.40–1.30)
BKR EGFR (AFR AMER): 54 mL/min/{1.73_m2} (ref >60)
BKR EGFR (NON AFRICAN AMERICAN): 45 mL/min/{1.73_m2} (ref >60)
BKR GLUCOSE: 236 mg/dL — ABNORMAL HIGH (ref 70–100)
BKR POTASSIUM: 3.9 mmol/L (ref 3.3–5.3)
BKR SODIUM: 137 mmol/L (ref 136–144)

## 2020-10-13 LAB — RETICULOCYTES
BKR WAM IRF: 21.9 % — ABNORMAL HIGH (ref 3.0–15.9)
BKR WAM RETICULOCYTE - ABS (3 DEC): 0.05 10Ë6 cells/uL (ref 0.023–0.140)
BKR WAM RETICULOCYTE COUNT PCT (1 DEC): 1.8 % (ref 0.6–2.7)
BKR WAM RETICULOCYTE HGB EQUIVALENT: 35.5 pg (ref 28.2–35.7)

## 2020-10-13 LAB — URINE MICROSCOPIC     (BH GH LMW YH)

## 2020-10-13 LAB — ZZZBLOOD SMEAR MD INTERPRETATION     (YH)

## 2020-10-13 LAB — IRON AND TIBC
BKR IRON SATURATION: 22 % (ref 15–50)
BKR IRON: 28 ug/dL — ABNORMAL LOW (ref 59–158)
BKR TOTAL IRON BINDING CAPACITY: 128 ug/dL — ABNORMAL LOW (ref 250–450)

## 2020-10-13 LAB — PARTIAL THROMBOPLASTIN TIME     (BH GH LMW Q YH)
BKR PARTIAL THROMBOPLASTIN TIME: 23.7 s (ref 23.0–31.4)
BKR PARTIAL THROMBOPLASTIN TIME: 41.5 s — ABNORMAL HIGH (ref 23.0–31.4)

## 2020-10-13 LAB — VANCOMYCIN, PEAK     (BH GH L LMW YH): BKR VANCOMYCIN PEAK: 42.8 ug/mL

## 2020-10-13 LAB — PT/INR AND PTT (BH GH L LMW YH)
BKR INR: 1.07 (ref 0.87–1.14)
BKR PARTIAL THROMBOPLASTIN TIME: 41 s — ABNORMAL HIGH (ref 23.0–31.4)
BKR PROTHROMBIN TIME: 11.6 s (ref 9.6–12.3)

## 2020-10-13 LAB — IMMATURE PLATELET FRACTION (BH GH LMW YH)
BKR WAM IPF, ABSOLUTE: 2.2 x1000/ÂµL (ref <20.0)
BKR WAM IPF, ABSOLUTE: 3 x1000/ÂµL (ref <20.0)
BKR WAM IPF: 5.2 % (ref 1.2–8.6)
BKR WAM IPF: 7.4 % (ref 1.2–8.6)

## 2020-10-13 LAB — FIBRINOGEN     (BH GH L LMW YH): BKR FIBRINOGEN LEVEL: 465 mg/dL — ABNORMAL HIGH (ref 194–448)

## 2020-10-13 LAB — LACTATE DEHYDROGENASE: BKR LACTATE DEHYDROGENASE: 147 U/L (ref 122–241)

## 2020-10-13 LAB — FERRITIN: BKR FERRITIN: 641 ng/mL — ABNORMAL HIGH (ref 30–400)

## 2020-10-13 LAB — HAPTOGLOBIN: BKR HAPTOGLOBIN: 280 mg/dL — ABNORMAL HIGH (ref 30–200)

## 2020-10-13 MED ORDER — CEFTRIAXONE IV PUSH 1000 MG VIAL & NS (ADULTS)
INTRAVENOUS | Status: CP
Start: 2020-10-13 — End: ?
  Administered 2020-10-13 – 2020-10-14 (×2): 10.000 mL via INTRAVENOUS

## 2020-10-13 MED ORDER — INSULIN LISPRO (U-100) 100 UNIT/ML SUBCUTANEOUS SOLUTION
100 unit/mL | SUBCUTANEOUS | Status: DC
Start: 2020-10-13 — End: 2020-10-14
  Administered 2020-10-13: 18:00:00 100 mL via SUBCUTANEOUS

## 2020-10-13 MED ORDER — INSULIN LISPRO (U-100) 100 UNIT/ML SUBCUTANEOUS SOLUTION
100 unit/mL | SUBCUTANEOUS | Status: DC
Start: 2020-10-13 — End: 2020-10-14
  Administered 2020-10-13: 23:00:00 100 mL via SUBCUTANEOUS

## 2020-10-13 MED ORDER — INSULIN LISPRO (U-100) 100 UNIT/ML SUBCUTANEOUS SOLUTION
100 unit/mL | SUBCUTANEOUS | Status: DC
Start: 2020-10-13 — End: 2020-10-14

## 2020-10-13 MED ORDER — OXYCODONE IMMEDIATE RELEASE 5 MG TABLET
5 mg | GASTROSTOMY | Status: DC | PRN
Start: 2020-10-13 — End: 2020-10-15
  Administered 2020-10-14 – 2020-10-15 (×6): 5 mg via GASTROSTOMY

## 2020-10-13 MED ORDER — VANCOMYCIN THERAPY PLACEHOLDER
INTRAVENOUS | Status: DC
Start: 2020-10-13 — End: 2020-10-14

## 2020-10-13 MED ORDER — INSULIN LISPRO 100 UNIT/ML (SLIDING SCALE)
100 unit/mL | SUBCUTANEOUS | Status: DC
Start: 2020-10-13 — End: 2020-10-15
  Administered 2020-10-13 – 2020-10-15 (×6): 100 mL via SUBCUTANEOUS

## 2020-10-13 MED ORDER — INSULIN LISPRO 100 UNIT/ML (SLIDING SCALE)
100 unit/mL | SUBCUTANEOUS | Status: DC
Start: 2020-10-13 — End: 2020-10-15

## 2020-10-13 MED ORDER — INSULIN LISPRO (U-100) 100 UNIT/ML SUBCUTANEOUS SOLUTION
100 unit/mL | SUBCUTANEOUS | Status: DC
Start: 2020-10-13 — End: 2020-10-13

## 2020-10-13 MED ORDER — INSULIN NPH ISOPHANE U-100 HUMAN 100 UNIT/ML SUBCUTANEOUS SUSPENSION
100 unit/mL | Freq: Every evening | SUBCUTANEOUS | Status: DC
Start: 2020-10-13 — End: 2020-10-14
  Administered 2020-10-14: 02:00:00 100 mL via SUBCUTANEOUS

## 2020-10-13 MED ORDER — INSULIN LISPRO 100 UNIT/ML (SLIDING SCALE)
100 unit/mL | SUBCUTANEOUS | Status: DC
Start: 2020-10-13 — End: 2020-10-13

## 2020-10-13 MED ORDER — VANCOMYCIN MAR LEVEL
Freq: Once | INTRAVENOUS | Status: CP
Start: 2020-10-13 — End: ?

## 2020-10-13 MED ORDER — AMINO ACIDS-PROTEIN HYDROLYSATE 15 GRAM-60 KCAL/30 ML ORAL LIQUID PACK
15-60 gram-kcal/30 mL | Freq: Two times a day (BID) | GASTROSTOMY | Status: DC
Start: 2020-10-13 — End: 2020-10-15
  Administered 2020-10-14 – 2020-10-15 (×4): 15-60 gram-kcal/30 mL via GASTROSTOMY

## 2020-10-13 NOTE — Other
VANCOMYCIN LEVEL EVALUATIONCurrent Vancomycin Order: 2 g IV every 24 hours. Day of Therapy: 4Vancomycin Indication: Empiric therapy for necrotic mass overlying trachea site, patient with diagnosis of laryngeal SCC. Team would like to keep patient on vancomycin for a total of 5 days. Estimated Creatinine Clearance: 60 mL/min (A) (by C-G formula based on SCr of 1.61 mg/dL (H)).Creatinine (mg/dL) Date Value 09/81/1914 1.61 (H) 10/12/2020 1.72 (H) 10/11/2020 1.74 (H)  Renal Function: StableLevel: 10/13/20 @ 02:34 = 42.8 (peak level after 3rd dose)Type of Level: Trough; Level drawn appropriately? No Peak level was drawn 3 hours after the third dose.Based on vancomycin level obtained, recommend:   Obtain trough level at 20:00 prior to 4th dose on 2/8/22Repeat Level:  Obtain trough level prior to 4th doseID/AST Consulted? No [facility] Vancomycin Dosing GuidelineBH Vancomycin Dosing GuidelineFor questions, please contact the pharmacist: Joneen Boers, PharmD    Phone/Mobile Heartbeat: [facility] or pharmacist covering blue team

## 2020-10-13 NOTE — Plan of Care
Plan of Care Overview/ Patient Status    1900-0700: Patient A&O x 4. Nonverbal with trach in place, able to use phone to express needs. RSL port c/d/i +flush +BR. L #20 running heparin drip 12 u/kg/hr. Assist x 2 turn and reposition. Tachycardic into 90s. bivona cuffed trach in place, heated humidified oxygen 6 liters at 28%. Productive cough. Oral suction at bedside, deep suctioned x 1 this shift. LBM 2/7. Voiding spontaneously in urinal. Peg tube in place running diabetasource - increased to from 75 ml/hr to goal of 90 ml/hr at 1 am. Tolerating well. abd pad underneath trach to help with secretions. Platelets 42 at 0152, provider made aware. Per pharmacy ok to continue heparin drip at this time - will monitor platelet counts. Next ptt to be drawn @0645 . Pain to neck/throat, given PRN oxycodone per G tube with +effect. PRN ativan for anxiety q6 also given with +effect. Heels offloaded. Accumax bed in use. Turned and repositioned q2 hours. Call bell within reach. Bed alarm on.See flowsheets for more details.

## 2020-10-13 NOTE — Transfer Summaries
[facility] -Oncology Arthur Accept Note     Arthur: Arthur [name], a 55 y.o. male Attending Provider: Vito Backers, MD   Admission Date: 10/09/2020  Hospital Day: Hospital Day: 5 Admission Diagnosis: Laryngeal cancer (HC Code) (HC CODE) [C32.9]  Larynx cancer (HC Code) (HC CODE) [C32.9]     History of Present Illness   CC: Laryngeal SCC     History Obtained Via: Arthur, chart review    Hospital Course per ENT Note:  Arthur [name] is a 55 y.o. 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 On 2/5, his trach was changed to a Bivona, which bypasses the obstruction.  [place] of the neck was performed on 02/05 which showed a laryngeal mass with stromal invasion and involvement of the thyroid gland, to trachea, hypopharynx, and proximal cervical esophagus with left level 3 and level 4 lymphadenopathy. [place] chest showed mediastinal adenopathy and circumferential thickening of trachea concerning for metastatic disease. He had bilateral dependant consolidations likely representing aspiration PNA. Pulmonary nodules were also present measuring up to 6mm.  On 2/6, TAART was called 2/2 mucous plugging, which was cleared by tracheostomal lavage/suction at bedside. On 2/7, a bedside biopsy was performed for tumor profiling and path. Hematology has been following for an incidentally found right IJ thrombus on [place] imaging.  He Was being followed by Medicine for management diabetes and CKD.  He continues to have increasing neck pain.    Oncology History (from chart review):  Initially presented in 06/2020  with intermittent Hoarseness dating back to the spring of 2021.   Underwent laryngoscopy in 06/15/2020 with finding of mass in the left vocal chord extending to the false vocal chords and arytenoid area.  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  PET-[place] on 07/01/20 which showed a 2.4 x 1.3 cm tumor involving the left vocal cord, extending to the midline and invading the thyroid cartiledge with nonspecific right submandibular, axillary, and inguinal adenopathy. Tumor staged as T3NxMx.  On 07/2020 He required emergent tracheostomy for stridor.   Was started on cisplatin and radiation weekly on 09/14/2020 in [place] with curative intent after he was determined not to be a candidate for primary surgery.   Found to have necrotic mass surrounding his trach stoma in 10/06/2020 and was tras[facility]ferred to [facility] after concerns of obstruction.         Pertinent Medical History:     Past Medical History:   Diagnosis Date   ? Diabetes mellitus (HC Code) (HC CODE)    ? Hypercholesteremia    ? Hypertension      No past medical history pertinent negatives. Family History   Problem Relation Age of Onset   ? Aneurysm Mother       No past surgical history on file.  No past surgical history pertinent negatives on file. Social History     Socioeconomic History   ? Marital status: Legally Separated     Spouse name: Not on file   ? Number of children: Not on file   ? Years of education: Not on file   ? Highest education level: Not on file   Occupational History   ? Not on file   Tobacco Use   ? Smoking status: Former Smoker  Packs/day: 1.00     Years: 30.00     Pack years: 30.00     Quit date: 03/04/2013     Years since quitting: 7.6   Substance and Sexual Activity   ? Alcohol use: Yes     Alcohol/week: 14.0 standard drinks     Types: 14 Cans of beer per week   ? Drug use: Not on file   ? Sexual activity: Not on file   Other Topics Concern   ? Not on file   Social History Narrative   ? Not on file     Social Determinants of Health     Financial Resource Strain:    ? Difficulty of Paying Living Expenses:    Food Insecurity:    ? Worried About Programme researcher, broadcasting/film/video in the Last Year:    ? Barista in the Last Year:    Transportation Needs:    ? Freight forwarder (Medical):    ? Lack of Transportation (Non-Medical):    Physical Activity:    ? Days of Exercise per Week:    ? Minutes of Exercise per Session:    Stress:    ? Feeling of Stress :    Social Connections:    ? Frequency of Communication with Friends and Family:    ? Frequency of Social Gatherings with Friends and Family:    ? Attends Religious Services:    ? Active Member of Clubs or Organizations:    ? Attends Banker Meetings:    ? Marital Status:    Intimate Partner Violence:    ? Fear of Current or Ex-Partner:    ? Emotionally Abused:    ? Physically Abused:    ? Sexually Abused:         Home Medications:     Medications Prior to Admission   Medication Sig Dispense Refill Last Dose   ? atorvastatin (LIPITOR) 20 mg tablet TAKE 1 TABLET BY MOUTH ONCE DAILY      ? famotidine (PEPCID) 20 mg tablet Take 20 mg by mouth 2 (two) times daily.      ? furosemide (LASIX) 20 mg tablet 1 tablet (20 mg total) by Per G Tube route daily. 30 tablet 1    ? insulin aspart U-100 (NOVOLOG) 100 unit/mL vial Inject under the skin 3 (three) times daily before meals. 8 units before breakfast and lunch, 14 units before dinner      ? insulin glargine,hum.rec.anlog (TOUJEO SOLOSTAR U-300 INSULIN SUBQ) Inject 62 Units under the skin daily.       ? lisinopriL (PRINIVIL,ZESTRIL) 10 mg tablet TAKE 1 TABLET BY MOUTH ONCE DAILY      ? LORazepam (ATIVAN) 0.5 mg tablet Take 0.5 mg by mouth every 6 (six) hours as needed for anxiety.   Past Month at Unknown time   ? metFORMIN (GLUCOPHAGE) 1000 mg tablet Take 1,000 mg by mouth daily.       ? albuterol (PROVENTIL, VENTOLIN) 2.5 mg /3 mL (0.083 %) nebulizer solution Take 3 mLs by nebulization daily.      ? azelastine (ASTELIN) 137 mcg (0.1 %) nasal spray Use 2 sprays in each nostril every 8 (eight) hours as needed for rhinitis. Use in each nostril as directed (Arthur not taking: Reported on 10/11/2020)   Not Taking at Unknown time   ? magnesium oxide (MAGOX) 400 mg (241.3 mg magnesium) tablet 1 tablet (400 mg total) by Per G Tube route daily. 200 tablet  1 More than a month at Unknown time   ? oxyCODONE (ROXICODONE) 5 mg Immediate Release tablet Take 5 mg by mouth every 4 (four) hours as needed.   Unknown at Unknown time   ? polyethylene glycol (MIRALAX) 17 gram packet Take 17 g by mouth daily. Mix in 8 ounces of water, juice, soda, coffee or tea prior to taking. (Arthur not taking: Reported on 10/11/2020)   Not Taking at Unknown time   ? traZODone (DESYREL) 50 mg tablet Take 25 mg by mouth nightly. (Arthur not taking: Reported on 10/11/2020)   Not Taking at Unknown time       Allergies as of 10/07/2020   ? (No Known Allergies)       Review of Systems:   Constitutional: denies fevers, chills  Skin: denies rashes, itching  HEENT: Sharp Neck pain extending into chest and back  CV/Pulm:  Denies palpitations. Endorses uncomfortable SOB and heavy secretions.  GI: denies blood in stool, diarrhea, constipation, vomiting, abdominal pain  GU: denies changes in urination  MSK: denies myalgias, joint pain  Neuro: denies headaches, dizziness, weakness      Objective     Current Medications This Admission:  Scheduled:   Current Facility-Administered Medications   Medication Dose Route Frequency Provider Last Rate Last Admin   ? famotidine (PEPCID) tablet 20 mg  20 mg Per G Tube BID Jeanmarie Plant, MD   20 mg at 10/12/20 2123   ? [Held by provider] furosemide (LASIX) tablet 20 mg  20 mg Per G Tube Daily Jeanmarie Plant, MD   20 mg at 10/11/20 0920   ? [Held by provider] insulin glargine (Semglee,Lantus) injection 50 Units  50 Units Subcutaneous Daily Rob Bunting, MD       ? insulin regular human (HumuLIN R, NovoLIN R) Sliding Scale (See admin instructions for dose)   Subcutaneous Q6H Truddie Crumble, MD   4 Units at 10/12/20 1316   ? piperacillin-tazobactam (ZOSYN) 4.5 g in sodium chloride 0.9% 100 mL (mini-bag plus)  4.5 g Intravenous Q8H Panth, Neelima, MD 33.33 mL/hr at 10/12/20 2127 4.5 g at 10/12/20 2127   ? rosuvastatin (CRESTOR) tablet 10 mg  10 mg Per G Tube Daily Jeanmarie Plant, MD   10 mg at 10/12/20 0981   ? sodium chloride 0.9 % flush 3 mL  3 mL IV Push Q8H Jeanmarie Plant, MD   3 mL at 10/12/20 1914   ? vancomycin (VANCOCIN) 2 g in sodium chloride 0.9% 500 mL IVPB  2 g Intravenous Q24H Panth, Neelima, MD 250 mL/hr at 10/12/20 2317 2 g at 10/12/20 2317   ? Vancomycin MAR Level   Intravenous Once Irine Seal, MD          PRN:   acetaminophen, albuterol, dextrose (GLUCOSE) 40 % gel 15 g **OR** fruit juice **OR** skim milk, dextrose (GLUCOSE) 40 % gel 30 g **OR** fruit juice, dextrose 50% in water (D50W), dextrose 50% in water (D50W), glucagon, LORazepam, oxyCODONE, oxyCODONE, oxymetazoline, sodium chloride, traZODone   Continuous:   ? D5 1/2 NS Stopped (10/13/20 0100)   ? heparin 25,000 units/250 ml infusion - High Risk of Bleeding 12 Units/kg/hr (10/13/20 0044)        VITALS:  Vitals:    10/13/20 0355   BP: (!) 143/89   Pulse: (!) 93   Resp: 18   Temp: 99 ?F (37.2 ?C)     Last 3 Weights    10/09/20 1700   Weight (lbs): 300.27  Lbs.    Temp:  [98.2 ?F (36.8 ?C)-99.1 ?F (37.3 ?C)] 99 ?F (37.2 ?C)  Pulse:  [86-114] 93  Resp:  [16-27] 18  BP: (126-154)/(39-89) 143/89  SpO2:  [93 %-99 %] 95 %  Device (Oxygen Therapy): tracheostomy collar  O2 Flow (L/min):  [6-15] 6 on 28%   I/Os:    Intake/Output Summary (Last 24 hours) at 10/13/2020 0407  Last data filed at 10/13/2020 0344  Gross per 24 hour   Intake 2095 ml   Output 2350 ml   Net -255 ml    CONTINUOUS INFUSIONS:  ? D5 1/2 NS Stopped (10/13/20 0100)   ? heparin 25,000 units/250 ml infusion - High Risk of Bleeding 12 Units/kg/hr (10/13/20 0044)        PHYSICAL EXAM:   Physical Exam  Constitutional:       General: He is not in acute distress.     Appearance: He is obese. He is not toxic-appearing.   HENT:      Head: Normocephalic and atraumatic.   Eyes:      General: No scleral icterus.     Extraocular Movements: Extraocular movements intact.      Conjunctiva/sclera: Conjunctivae normal.   Neck:      Comments: Trach in place with secretions visible around trach.   Cardiovascular:      Rate and Rhythm: Normal rate and regular rhythm.      Pulses: Normal pulses.      Heart sounds: Normal heart sounds. No murmur heard.   No friction rub. No gallop.    Pulmonary:      Effort: Pulmonary effort is normal. No respiratory distress.      Breath sounds: No stridor. No wheezing.      Comments: Coarse breath sounds  Abdominal:      General: Abdomen is flat. Bowel sounds are normal. There is no distension.      Palpations: Abdomen is soft.      Tenderness: There is no abdominal tenderness. There is no guarding.   Skin:     General: Skin is warm and dry.   Neurological:      Mental Status: He is alert and oriented to person, place, and time.      Comments: Unable to converse due to trach but types on his cell phone to communicate         LABS:    CBC: BMP:   Recent Labs     10/10/20  0511 10/10/20  0511 10/11/20  0429 10/12/20  0140 10/13/20  0001   WBC 16.5*   < > 10.6 8.1 7.4   RBC 3.17*   < > 2.85* 2.69* 3.04*   HGB 8.9*   < > 7.9* 7.5* 8.3*   HCT 27.60*   < > 24.10* 23.20* 26.60*   MCV 87.1   < > 84.6 86.2 87.5   MCH 28.1   < > 27.7 27.9 27.3   MCHC 32.2   < > 32.8 32.3 31.2   RDWCV 14.5   < > 14.5 14.6 14.3   PLT 149*   < > 83* 54* 42*   MPV 10.9   < > 10.8 11.2 11.9   NEUTROPHILS 88.9*  --  86.4* 83.7*  --    MONOCYTES 7.0  --  7.9 7.6  --     < > = values in this interval not displayed.     Recent Labs     10/10/20  0511 10/10/20  0710 10/11/20  1610 10/11/20  0759 10/12/20  0140 10/12/20  0614 10/12/20  1154 10/12/20  1724 10/12/20  2137   NA 134*  --  136  --  137  --   --   --   --    K 4.9  --  4.6  --  4.1  --   --   --   --    CL 98  --  101  --  101  --   --   --   --    CO2 26  --  26  --  27  --   --   --   --    ANIONGAP 10  --  9  --  9  -- --   --   --    GLU 143*   < > 163*   < > 203*   < > 203* 145* 149*   BUN 36*  --  31*  --  26*  --   --   --   --    CREATININE 1.71*  --  1.74*  --  1.72*  --   --   --   --    BCR 21.1  --  17.8  --  15.1  --   --   --   --    EGFRAFRAMER 51  --  50  --  50  --   --   --   --    EGFRNONAFRAM 42  --  41  --  41  --   --   --   --    CALCIUM 9.9  --  9.8  --  9.5  --   --   --   --     < > = values in this interval not displayed.     Estimated Creatinine Clearance: 56 mL/min (A) (by C-G formula based on SCr of 1.72 mg/dL (H)).   COAGULATION: LFTs:   Recent Labs     10/13/20  0001   PTT 23.7     Recent Labs     10/12/20  0140   ALBUMIN 2.6*      CARDIAC: OTHER:   No results for input(s): TROPONINI, TROPONINT, CKMB, CKMBINDEX, BNPPRO in the last 72 hours. No results for input(s): LACTATE, CKTOTAL, LIPASE, AMMONIA in the last 72 hours.   ENDOCRINE:    Recent Labs     10/10/20  0511   HGBA1C 9.0*       ABG: VBG:   No results for input(s): PHART, PCO2ART, PO2ART, HCO3ART, O2SATART, LITERFLOW in the last 72 hours. No results for input(s): PHVEN, PCO2VEN, PO2VEN, HCO3VEN, O2SATVEN, LITERFLOW in the last 72 hours.   URINE: MICRO:   Recent Labs     10/11/20  1218 10/13/20  0343   CLARITYU Clear Clear   COLORU Colorless Yellow   SPECGRAV 1.017 1.018   PHUR 6.5 6.0   PROTEINUA Trace 1+*   GLUCOSEU Negative Negative   KETONESU Negative Negative   BLOODU Trace* Negative   LEUKOCYTESUR Negative Negative   NITRITE Negative Negative   BACTERIA None  --    EPITHELIALCE None  --      Recent Labs     10/11/20  1218   EPITHELIALCE None   HYALINECASTS 0     Recent Labs     10/11/20  1218   SODIUMURR 111   KUR 28   CREATININEUR 40    Lab Results  Component Value Date    LABURIN No Growth 10/11/2020     No results found for: LABGRAM   No results found for: LEGIONELLAAG, SPNEUMONIAEU, MPNEUMO, MYCOPCR, POCCHLAMPNEU  No results found for: CDIFFICILEAN   Lab Results   Component Value Date    SARSCOV2 Negative 10/09/2020 Imaging/Monitors      Radiology    [place] Soft Tissue Neck w IV Contrast    Result Date: 10/10/2020  Large transspatial necrotic laryngeal mass with stomal invasion and involvement of the thyroid gland, trachea, hypopharynx and proximal cervical esophagus as above. Associated necrotic left level 3 and 4 lymphadenopathy as above. 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 Please see concurrently [place] chest examination for additional details regarding the chest. Report Initiated By:  Loel Lofty, MD Reported And Signed By: Remer Macho, MD  [facility] Radiology and Biomedical Imaging    [place] Chest w IV Contrast (eg: Empyema, Complex pleural and mediastinal masses, staging of central lung cancer)    Result Date: 10/10/2020  Partially visualized soft tissue neck mass which is also characterized on the concurrent neck [place]. There is mediastinal adenopathy and circumferential thickening of the trachea concerning for metastatic disease. Bilateral dependent consolidations likely represent aspiration. Pulmonary nodules measuring up to 6 mm which are nonspecific but should be followed on future imaging per oncology protocol to exclude metastatic disease. This study was reviewed by Dr. Leilani Merl. If you have any questions or concerns, call or text [phone] to speak to the radiologist. Reported And Signed By: Leilani Merl, MD  [facility] Radiology and Biomedical Imaging    US Renal    Result Date: 10/12/2020  No hydronephrosis. Reported And Signed By: Darrell Jewel, MD  [facility] Radiology and Biomedical Imaging    US Duplex Lower Extremity Venous Bilat    Result Date: 10/12/2020  No evidence of deep venous thrombosis of the bilateral lower extremities. Ultrasound is only of moderate sensitivity for the diagnosis of deep vein thrombosis of the calf. If deep vein thrombosis of the calf is of clinical concern, followup ultrasound examination in 4-7 days would be of value to exclude propagation of clot from the calf. Reported And Signed By: Dulce Sellar, MD  [facility] Radiology and Biomedical Imaging    US Duplex Upper Extremity Venous Bilateral    Result Date: 10/12/2020  No evidence of deep venous thrombosis within the bilateral upper limb/neck deep venous system. Superficial venous thrombosis within the right basilic cubital vein at the antecubital fossa. Reported And Signed By: Darrell Jewel, MD  [facility] Radiology and Biomedical Imaging    No results found.      Cardiac  EKG:No results found for this or any previous visit.  TTE:No results found for this or any previous visit.     Impression     Arthur [name] is a 55 y.o. male  w/hx of HTN, HLD, DM, tobacco and EtOH use, chronic ITP, Waldenstrom macroglobulinemia, T3NxMx L laryngeal SCC currently on weekly cisplatin and RT s/p trach/PEG transferred from [place] on 2/4 due to SOB 2/2 partial obstruction 2/2 to tumor progression and is now s/p trach exchange on 2/5. He was transferred to oncology for further management of his SCC.    Plan       # laryngeal cancer  -S/p trach exchange on 2/5  -[place] chest suggestive of mediastinal mets  - Radiation oncology consulted on 2/7, who are trying to obtain full rad onc records from [facility].  - Follow up path from  2/7 with tumor profiling  - vanc/zosyn started for empiric coverage of possible super-imposed infection of tumor given leukocytosis on admission which has improved; Arthur [name] also suggests aspiration. Will continue abx (Day 3 on 10/12/2020)    ?  # IJ thrombus noted on [place]  - No e/o DVT in upper or lower extremities per [name] 2/7, but does have Superficial venous thrombosis within the right basilic cubital vein at the antecubital fossa  -ENT cleared Arthur for [facility]  - Hematology recommended heparin gtt for [facility]  - Switched from sq heparin to heparin gtt high risk bleeding protocol  -Monitor for bleeding  ?  # T2DM  -Diabetic team following for management of blood sugar  -Currently recommending to hold glargine and continue medium SSI  - Diet is by tube feeds per G-tube per nutrition recs. Currently getting continuous feeds, but will consider transitioning to bolus feeds  -Stopping D5 1/2 saline infusion since tube feeds are at goal.   ?  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   - Lasix and lisinopril being held  - Treatment per medical teams    #History of HTN:  -Bps acceptable for inpatient  -Continue Holding home lisinopril and lasix    #HLD  -Continue rosuvastatin      Communication     Plan to update PCP Hyman Hopes, [place]) and family ( Primary Emergency Contact: [place], [name], Home Phone: [phone]) today.    Plan to be discussed with attending.    Signed:  Dellia Cloud, M.D., Ph.D.  PGY2 Resident  Internal Medicine  [facility]: [phone]

## 2020-10-13 NOTE — Progress Notes
Inpatient Diabetes Management Team Follow-up Note    Over the past 24 hours, the patient's glucose control has been variable but close to goal     ? Current Nutritional Status:  Diabetisource @ 90 ml/hr --> change to:         2/8: 0.5 cartons @ 10 am, 1 carton @ 2 pm, 1.5 cartons @ 6 pm, 1       ProSource BID, cycle Diabetisource @ 90 mL/hr x 12 hrs (8 pm-8 am)  ?       2/9: 1 carton @ 10 am, 2 cartons @ 2 pm, 2 cartons @ 6 pm, 1 ProSource BID, cycle Diabetisource @ 90 mL/hr x 12 hrs (8 pm- 8 am)  ? Current Anti-hyperglycemic Regimen:  glargine 45 unis q am(held), regular insulin mid dose sliding scale q 6 hours  ? Home Anti-hyperglycemic Regimen: Toujeo U300 64 units daily, Apidra 20 units TID with meals, metformin 1000 mg bid   ? Other Contributing Medications: D51/2NS-- dc'd at 1am     PE:    Temp:  [98.2 ?F (36.8 ?C)-99.1 ?F (37.3 ?C)] 98.7 ?F (37.1 ?C)  Pulse:  [88-114] 108  Resp:  [16-27] 18  BP: (126-157)/(39-95) 157/95  SpO2:  [95 %-99 %] 97 %  Device (Oxygen Therapy): tracheostomy collar  O2 Flow (L/min):  [6] 6     Wt Readings from Last 3 Encounters:   10/09/20 (!) 136.2 kg   10/06/20 (!) 139.5 kg   09/29/20 (!) 142.4 kg       Data Review:  BGs:      Creatinine (mg/dL)   Date Value   16/06/9603 1.61 (H)     Hemoglobin A1c (%)   Date Value   10/10/2020 9.0 (H)       Assessment:  55 yo h/o type 2 diabetes, HTN, Waldenstroms macroglobulinemia, ETOH, laryngeal SCC s/p trach/PEG presented with SOB significant for tumor invading the trach stoma with partial obstruction.  Diabetes team consulted for assistance in glycemic management.      BG over the past 24 hours have been variable but closer to goal. He was started on continuous tube feeds and maintained on dextrose infusion until this am when it was discontinued in the setting of reaching goal tube feed and BG above goal in 200's.  He is now planned to transition to nocturnal continuous tube feeds from 8p-8a then begin bolus tube feeds today.  Based on his prior BG data and no insulin requirements off tube feeds suggesting he is having insulin resistance in the setting of feeding therefore would recommend to utilize NPH as he is getting continuous tube feeds over 12 hours then for bolus tube feeds would utilize bolus dose of insulin with lispro and adjust as tube feeds doses are uptitrated to goal.  Would discontinue the glargine to avoid fasting hypoglycemia as he had no insulin requirements off the tube feeds. Discussed plan with the covering provider, Dr Evorn Gong.   Based on current AACE/ADA guidelines, the goal is to maintain all BGs<180 mg/dl, ideally with pre-prandial BGs<140 mg/dl.    Recommendations:   1. DISCONTINUE glargine 45 units q am   2. START NPH 24 UNITS Q 8PM AT START OF NOCTURNAL TUBE FEEDS -- do not give if tube feeds are held   3. START LISPRO 2 UNITS WITH 10AM BOLUS 0.5 CAN - hold if bolus not given   4. START LISPRO 5 UNITS WITH 2 PM BOLUS 1 CAN -- hold if bolus not  given   5. START LISPRO 7 UNITS WITH 6 PM BOLUS 1.5 CANS -- hold if bolus not given   6. START LISPRO MID DOSE SLIDING SCALE TIMED AT 10A, 2P, 6P  7. START LISPRO MID DOSE SLIDING SCALE Q MIDNIGHT AND 6A  8. DISCONTINUE Regular insulin mid dose sliding scale q 6 hours   9. Continue blood glucose monitoring 12AM, 6AM, 10 AM, 2PM, 6PM  10. Diet: tube feeds per nutrition       Please let us know if you have any questions or concerns about our recommenations. We will continue to follow the patient along with you during his hospitalzation as we attempt to optimize his glycemic control.  Detailed recommendations were communicated to the primary team both verbally and/or through this written note.  I spent a total of 25 minutes with the patient of which 15 minutes (over 50%) was spent in counseling the patient regarding diabetes management and coordinating care with the primary team.     Signed:  Yevonne Aline PA   Inpatient Diabetes Management Team   [facility]: [phone]  Diabetes consult pager # [phone]      NOTE: During nights, weekends, and holidays, please contact the on-call Endocrine Fellow at pager [phone].

## 2020-10-13 NOTE — Plan of Care
Nutrition NoteChart reviewed for consult re: Transitioninfg from continuous to bolus feeds - pleasae advise on schedule. Current diet orders are for Diabetisource Begin at 15cc/h, advancing 15cc every 6h to goal 90cc/h. Per EMR review, pt reached goal of 90 mL/hr over night. Full nutrition assessment completed 2/6- please refer to for more details. Pt's home regimen includes Glucerna cycled over night and then bolus feedings during the day. Recommendations for combined cycled and bolus feeds discussed with covering provider, Max Reece Packer, and orders adjusted per protocol as below.TF recommendations:Diabetisource @ 90 mL/hr x 12 hrs (I.e. 8 pm-8 am) then transition to bolus feedings with 1 carton @ 10 am, 2 cartons @ 2 pm, and 2 cartons @ 6 pm, plus 1 ProSource BID- Goal provides (~9.5 cartons/day): 2916 kcal, 170 g protein, 1906 mL free water- FWF per team. Additional 1010 mL/day to meet 1 mL/kcal fed.Transition to goal as follows:2/8: 0.5 cartons @ 10 am, 1 carton @ 2 pm, 1.5 cartons @ 6 pm, 1 ProSource BID, cycle Diabetisource @ 90 mL/hr x 12 hrs (8 pm-8 am)2/9: 1 carton @ 10 am, 2 cartons @ 2 pm, 2 cartons @ 6 pm, 1 ProSource BID, cycle Diabetisource @ 90 mL/hr x 12 hrs (8 pm- 8 am) - GOALElectronically Signed by Gorden Harms, RD, February 8, 2022[facility] [phone]

## 2020-10-13 NOTE — Plan of Care
Plan of Care Overview/ Patient Status    Transferred to NP 12 on 10/12/20 55 y.o. 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 Patient admitted to ENT service and underwent trach exchange on 10/10/20, now transferred to the oncology floor for further management. Incidentally found to have L IJ thrombus. Was started on heparin gtt briefly 10/12/20 evening, holding due to platelets <50. Was started on Vanc/Zosyn empirically given leukocytosis on admission. Leukocytosis has resolved. Narrowed Zosyn to ceftriaxone on arrival to the onc floor, continuing Vanc given positive MRSA swab. Patient is hemodynamically stable on trach mask (28%, 6L). Adjusting his insulin regimen per diabetes team recommendations. Identified in TCR as nearing medical readiness for discharge. PT evaluation will be needed. Discussed with niece, [name] and as per previous CM note, she does not wish for patient to return to [facility] where he has been receiving STR since the end of November. She stated that she was waiting for an update from provider. Medicare.gov nursing home compare in the [place] area will be provided. Care Management will follow with team.Arthur [name] R.N. BS. Case ManagementYale Elkh[facility] Hospital [place], [place] : [phone]

## 2020-10-13 NOTE — Consults
[facility] HealthInpatient Medical Oncology Service -  Oncology Attending Consult NoteLenwood [name] is a 55 y.o. male admitted with Laryngeal cancerShortness of breath at rest.Chronic [facility] diagnosed venous thromboembolism of internal jugular veinreferred for Medical Oncology consult by Dr. Dalene Seltzer for management and ongoing oncologic support.The patient was seen and examined independently by me.I recommend the following:[name] is known to my colleague Dr. Daphene Jaeger in the [facility] in [place].  He has a history of hypertension, hyperlipidemia, Diabetes, Alcohol use, ITP and Waldenstroms macroglobulinemia.  He was diagnosed with a laryngeal cancer in October of 2021. He had a tracheostomy placed for stridor.  He started cisplatin and radiation in January of 2022. During his radiation he was noted to have a necrotic mass around his trachea.  He also was noted to have pulmonary nodules on [facility] scan.  He was admitted to [facility] with shortness of breath and neck pain and findings of a new internal jugular vein thrombus in the right side.  On examination his heart rate was in the 90s.  He had coarse breath sounds bilaterally.  He had secretions around the tracheostomy site.  His white count was 7.4 hemoglobin 8.3 platelet count 42 creatinine 1.6.  A renal ultrasound was negative for hydronephrosis.  Tumor profiling is pending.  He was started on antibiotics with vancomycin and Zosyn.  He also started heparin for the new internal jugular vein thrombus.  He was evaluated by Radiation Oncology and his case was also discussed with Dr. Laurina Bustle from head and neck Medical Oncology.  They will evaluate whether he will benefit from radiation during this hospitalization or whether to start palliative chemotherapy instead.  His ITP is noted.  His platelet count is currently less than 50 and a decision must be made whether to continue heparin in the setting of a low platelet count.  His family are aware of his admission and have requested a goals of care discussion.  I will continue to see him as needed during this hospitalization.  On the day of this patient's encounter, a total of 55 minutes was personally spent by me.  This does not include any resident/fellow teaching time, or any time spent performing a procedural service. >50% of time was spent in counseling and coordination of care. The plan was discussed in detail with the [facility] attending. The case has/will be reviewed at Multidisciplinary Rounds with care management, social work and nursing. Patient was evaluated: In person

## 2020-10-13 NOTE — Plan of Care
Plan of Care Overview/ Patient Status    Assumed care @ 0700, a+o x 4, non verbal, able to communicate via written language using cell phone, mood anxious and tearful at times, ativan given prn with good eff, mae 5/5, bed mobility per self, bed bath provided/linens changed, skin w/d, trach peristomal skin reddened with thick clear drainage, provider at bed side for biopsy with no noted bleeding, trach care provided for bivona trach/patent airway maintained, ls- coarse and diminished at bases, occ loose prod cough noted, tracheal suctioning provided x 2, superficial trach suction provided intermittently, sputum thick/tan and blood streaked, complained of shortness of breath at times, maintained o2 sat >95% on 6l o2 @ 28% fio2 via trach mask/humidified, abd soft and nontender, hypoactive b sounds noted, small soft bm noted, pericare provided with incont care, peg patent/tf provided with incremental increases diabetasource (see flow sheets) and tolerated well, voids spont via urinal, hr and bp goal met, normal sinus on tele, complained of mild pain and refused interventions offered for pain, resp therapy at bed side to assess patient this shift, report called to np 12, patient transported via bed safely, see mar and flowsheets for more info

## 2020-10-13 NOTE — Plan of Care
[facility]		Spiritual Care NotePurpose: Referral Source: Chaplain Initiated Observation: People present/Information Obtained From: Patient Emotional Mood: Calm, Withdrawn       Subjective: Arthur [name] presented as calm, withdrawn.  He is trached so unable to speak.  He is able to communicate by motioning.  When asked what I could do for him, Arthur [name] put his hands together motioning for prayer, so prayer and blessing was offered.  Gave Arthur [name] a [place].  No further needs communicated.Spiritual Assessment:Referral Source: Chaplain Initiated Information Obtained From: Patient Mood: Calm, Withdrawn     Spiritual Resources: PrayerHelpful Religious Practices: Scripture Reading, Shared Prayer    Issues RaisedRaised by Patient: No Issues Raised     Spiritual Interventions:Spiritual Intervention Index**Date of Spiritual Visit: 02/08/22Visit Type: Initial VisitLanguage or special accommodation rendered?: NoHow Well Do You Speak English?: very wellIntervention Type: Spiritual VisitResponding Chaplain: Unit ChaplainReligious Needs: Prayer, BibleSpiritual/Religious Support Provided: Companionship, PrayerOutcome: OUTCOMES: Expressed Spiritual/Religious Resources or Distress: Utilized Loss adjuster, chartered, Expressed gratitude   OUTCOMES: Progressed Spiritually: Established chaplain relationship OUTCOMES: Progressed Emotionally or Physically: Increased satisfaction     Plan: Remain available for support.   Total Consult Time: 19 minutes	Signed: Sharol Roussel, Spiritual Care DepartmentMHB [phone] 2/8/20222:56 PMPlan of Care Overview/ Patient Status

## 2020-10-13 NOTE — Consults
Patient: Arthur [name] MRN: [id number] Attending Physician: Lorain Childes, MDDate of service: 10/12/2020 RADIATION ONCOLOGY CONSULTATION REPORTIDENTIFICATION / CHIEF COMPLAINT: Arthur [name] is a 55 y.o. 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 Keane Police for opinion and consultation of radiation therapy.  HISTORY OF PRESENT ILLNESS: Arthur [name] history is as follows:Full oncologic history in the process of being obtained from [facility]. Patient is currently undergoing concurrent chemoRT for advanced laryngeal cancer. He was seen by his oncology team in [place] on 10/06/20 and was noted to have necrotic mass surrounding his trach stoma, and he was noting SOB with increased WOB. Imaging was not obtained as he could not lie flat. He was initially admitted to [facility], and then transferred to [facility] after difficulties with deep suction of his trach and concern for obstruction.FFL was performed by ENT, which showed partial distal obstruction, but airway open after that. Procedure note: The scope was passed through the nasal cavity. The nasopharynx appears normal. The vallecula and base of tongue appear normal. The superior epiglottis is crisp. There is an irregularity of the L AE fold, arytenoid, and the VC, however, clear visualization was not achievable due to significant secretion burden. The R AE fold, arytenoid, and VC appear grossly normal. Mobility of the VC were not able to be assessed. There is significant secretion burden obstructing the view of the hypopharynx.The scope was then passed through the tracheostomy tube. There is are mobile pale flaps right passed the distal end of the tube causing partial obstruction during respiration. Just passed Arthur, there is left posterolateral mass that is causing a fixed partial obstruction. Passed Arthur, the airway is patent and then carina is visualized.On 10/10/20, he went to OR for trach exchange for Bivona. Updated imaging was obtained: 10/10/20 [place] neck: IMPRESSION: Large transspatial necrotic laryngeal mass with stomal invasion and involvement of the thyroid gland, trachea, hypopharynx and proximal cervical esophagus as above. 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 [place] chest: IMPRESSION: Partially visualized soft tissue neck mass which is also characterized on the concurrent neck [place]. There is mediastinal adenopathy and circumferential thickening of the trachea concerning for metastatic disease.Bilateral dependent consolidations likely represent aspiration. Pulmonary nodules measuring up to 6 mm which are nonspecific but should be followed on future imaging per oncology protocol to exclude metastatic disease.He is overall doing well now s/p trach exchange (he now has [place]). His course however has been complicated by difficult to control DM and history of CKD with prior electrolyte disturbances, and he has been followed by Medicine while in-house. 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 Daphene Jaeger (Med Onc) and Dr. Quenton Fetter (Rad Onc). I spoke on the phone to Dr. Daphene Jaeger and confirmed that patient is receiving Cisplatin. I spoke on the phone with Dr. Madaline Guthrie (Dr. Adele Schilder partner), who updated me that patient is currently undergoing curative intent RT with IMRT plan to 70 Gy. She far he's received 17/35 fractions, and his last fraction was on 10/06/20. Treatment team conveyed to me that he has had progression of disease while on treatment. We are in the  process of obtaining full Radiation Oncology records from [facility], including records from prior imaging.Medical Oncology was consulted today.Today, he is seen at bedside. He is mostly non-verbal due to trach, and was largely communicating with hand gestures/signals. He reports that he has been doing overall ok with radiation therapy in [place], though reports increasing pain in the throat area from RT. He is willing to undergo radiation here in [facility] if that is recommended for him. RADIATION THERAPY HISTORY: The patient denies any previous history of collagen-vascular disease, inflammatory bowel disease, diabetes mellitus, or pacemaker placement.Prior/Current RT: Currently undergoing RT with Dr. Quenton Fetter at [facility], last fraction 10/06/20, has received 17/35 fractions, IMRT, total planned dose is 70 Gy  PAST MEDICAL HISTORY: Past Medical History: Diagnosis Date ? Diabetes mellitus (HC Code) (HC CODE)  ? Hypercholesteremia  ? Hypertension  PAST SURGICAL HISTORY: No past surgical history on file.ALLERGIES: No Known Allergies                           MEDICATIONS: Outpatient: Current Facility-Administered Medications: ?  acetaminophen?  albuterol?  D5 1/2 NS?  dextrose (GLUCOSE) 40 % gel 15 g **OR** fruit juice **OR** skim milk?  dextrose (GLUCOSE) 40 % gel 30 g **OR** fruit juice?  dextrose 50% in water (D50W)?  dextrose 50% in water (D50W)?  famotidine?  [Held by provider] furosemide?  glucagon?  heparin (porcine)?  insulin glargine?  insulin regular human?  LORazepam?  oxyCODONE?  oxyCODONE?  oxymetazoline?  piperacillin-tazobactam?  rosuvastatin?  sodium chloride?  sodium chloride?  traZODone?  vancomycin?  Vancomycin MAR levelSOCIAL HISTORY:  Social History Tobacco Use ? Smoking status: Former Smoker   Packs/day: 1.00   Years: 30.00   Pack years: 30.00   Quit date: 03/04/2013   Years since quitting: 7.6 Substance Use Topics ? Alcohol use: Yes   Alcohol/week: 14.0 standard drinks   Types: 14 Cans of beer per week ? Drug use: Not on file FAMILY HISTORY: Family History Problem Relation Age of Onset ? Aneurysm Mother  REVIEW OF SYSTEMS:General: No fevers, chills, or sweats. No change in appetite or anorexia. No weight loss. Head, ears, eyes, nose, throat: As per HPI. Sensory: No change in vision or hearing.Cardiovascular: no chest pains, palpitations, or orthopnea.Lungs: no shortness of breath, dyspnea on exertion, cough, or hemoptysis. Abdomen: no nausea, vomiting, abdominal distension, abdominal pain, indigestion, diarrhea, constipation, or blood in the stool. Genitourinary: no dysuria, hematuria, or urinary frequency. Extremities: No peripheral edema.Musculoskeletal: No myalgia or arthralgia.Skin: No rashes or skin lesions. Neurologic: No lightheadedness or dizziness. No headaches, syncope, or seizure activity. Psychiatric: no depression.PHYSICAL EXAM:ECOG performance status: 3Pain Score:  4BP 137/75  - Pulse 89  - Temp 99.1 ?F (37.3 ?C) (Oral)  - Resp 17  - Ht 6' 2 (1.88 m)  - Wt (!) 136.2 kg  - SpO2 98%  - BMI 38.55 kg/m? General: Sitting up in hospital bed, suctioning trach secretions.HEENT: Anicteric sclera. Extra-ocular movements intact. Pupils equally round and reactive to light. Tracheostomy in place, with visible tumor seen externally at trach site. Respiratory: Mildly increased work of breathing due to suctioning of trach.Abdomen: Non-distended.  Extremities: No lower extremity edema.  Musculoskeletal: Moves all extremities without difficulty.  Neurologic: Awake, alert, and oriented to person, place and time. Affect appropriate. Skin: No rashes, or jaundice.  LABS/ IMAGING:Refer to HPI and additional information provided in the inpatient and/or outpatient records.  Pertinent labs have been reviewed. ASSESSMENT/PLAN:Arthur [name] is a 55 y.o. 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 Keane Police for opinion and consultation of radiation therapy.  Radiation Oncology was consulted for the ongoing oncologic management of Arthur patient. He is currently undergoing chemoradiotherapy in [place], with Dr. Daphene Jaeger (Med Onc) and Dr. Quenton Fetter (Rad Onc). He has received 17 out of 35 planned fractions of radiotherapy, and per discussion with his [place] team, has had progression of disease while undergoing treatment. Full records, including radiation treatment plans and dosimetry, are still being obtained from [place]. Pre-treatment imaging has also been requested. While we do not yet have baseline comparison, he has quite extensive disease on new [place] scans, that may warrant change in radiation plans. Final plan pending review of outside records and multidisciplinary discussion with Medical Oncology as well as his treatment team in [place].Nilda Calamity, MD, MPHPGY-5 ResidentRadiation [phone]:  Arthur 55 year old [name] from [place] has a PMHx of DM, HLD, HTN, CKD, and Waldenstrom macroglobulinemia. He has been diagnosed with locally advanced laryngeal SCC (cT3NxMx at diagnosis in October 2021 and had been started on chemoRT after determination that he was not a candidate for primary surgery, s/p emergency tracheostomy in mid-November 2021. A staging PET-[place] had been performed in October in [place]. The patient was set up for curative intent RT under Dr Adele Schilder care at the [facility] along with weekly cisplatin per Dr. Daphene Jaeger. After 34 Gy in 17 fractions out of planned 70 Gy through 10/06/20, the patient had apparent progressive disease about his trach stoma, s/p PEG/trach exchange. But with ongoing dyspnea and upper airway obstruction, the patient was admitted on [facility] last week, requiring additional trach tube replacement. [place] imaging of the neck and chest yesterday, 10/12/19, shows a heavy burden of paratracheal tumor in the low neck and upper medastinum along with subcarinal adenopathy. A tumor biopsy today was obtained for molecular profiling and other IHC to help sort out additional systemic therapy options. We will need to get recent RT records from [place], but it is likely that his patient's tumor as progressed both inside and outside his treatment volume with signifcant metastatic spread such that curative intent RT is not realistically feasible. Further more intensive systemic therapy is a strong consideration while additional palliative RT to the bulky paratracheal/laryngeal tumor could be considered in parallel. Further RT if here at [facility] will require a new [place]-simulation and use of conformal techniques such as further IMRT will take 1-2 weeks to organize thereafter. The patient extremely anxious and is feeling in a rather desperate condition, but is willing to continue therapeutic efforts during Arthur acute hospitalization and thereafter here in [facility] rather than in [place]. Oncologic history apparently begins in October 2021 when the patient presented with intermittent hoarseness since the Spring. Initial evaluation back in the [place] was unremarkable per Dr. Nicola Police notes. But, symptoms progressed and he underwent laryngoscopy 06/15/20 with finding of a mass in the left vocal cord, extending to the false vocal cords and arytenoid area.  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 The patient was referred to Dr. Quenton Fetter and he organized a PET-[place] on 07/01/20 which showed a 2.4 x  1.3 cm tumor involving the left vocal cord, extending to the midline and invading the thyroid cartiledge. There was nonspecific right submandibular, axillary, and inguinal adenopathy.  Before a treatment plan could be put together, the patient was acutely hospitalized at [facility] on 07/15/2020 for stridor. Tracheostomy was placed emergently. Records are unavailable in the immediate aftermath, but one may surmise that the patient may have either refused laryngectomy or was deemed to have unresectable disease. After some delays, chemoRT was organized starting on 09/14/2020 with weekly Cisplatin concurrent with IMRT. A feeding tube was placed given baseline dysphagia and anticipated need for nutritional and fluid/electrolyte support.?Dr. Laban Emperor, ENT in [place] had performed a pretreatment endoscopy through the trach and found no evidence of tumor below the larynx.?Several weeks into chemoRT, the patient was seen by his Oncology team in [place] on 10/06/20 and was noted to have necrotic mass surrounding his trach stoma. The patient as experiencing increasing dyspnea and work of breathing and could not longer lie plate. He was noted to have tumor infiltrating the skin around his stoma. He was initially admitted to [facility], and then transferred to [facility] on 10/09/20 after difficulties with deep suction of his trach and concern for obstruction.FFL was performed by ENT upon [place] admission, which showed partial distal obstruction, but airway patent after that. Procedure note: The scope was passed through the nasal cavity. The nasopharynx appears normal. The vallecula and base of tongue appear normal. The superior epiglottis is crisp. There is an irregularity of the L AE fold, arytenoid, and the VC, however, clear visualization was not achievable due to significant secretion burden. The R AE fold, arytenoid, and VC appear grossly normal. Mobility of the VC were not able to be assessed. There is significant secretion burden obstructing the view of the hypopharynx.The scope was then passed through the tracheostomy tube. There is are mobile pale flaps right passed the distal end of the tube causing partial obstruction during respiration. Just passed Arthur, there is left posterolateral mass that is causing a fixed partial obstruction. Passed Arthur, the airway is patent and then carina is visualized.On 10/10/20, the patient went to OR for trach exchange for Bivona. Urgent [place] imaging was obtained of the neck and chest:[place] neck: Large transspatial necrotic laryngeal mass with stomal invasion and involvement of the thyroid gland, trachea, hypopharynx and proximal cervical esophagus as above. Associated necrotic left level 3 and 4 lymphadenopathy. 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 [place] chest: Partially visualized soft tissue neck mass which is also characterized on the concurrent neck [place]. There is mediastinal adenopathy and circumferential thickening of the trachea concerning for metastatic disease. Bilateral dependent consolidations likely represent aspiration. Pulmonary nodules measuring up to 6 mm which are nonspecific but should be followed on future imaging per oncology protocol to exclude metastatic disease.The patient is somewhat improved since Arthur trach tube exchange but is having considerable difficulties with airway secretions. His hospital course has been complicated further by difficult to control DM and history of CKD with prior electrolyte disturbances, and he has been followed by Medicine while in-house. 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 Biopsy today of the neck mass was performed for tumor profiling. Medical Oncology was consulted today and is accepting Arthur patient in transfer to the [facility] service Arthur evening.The patient is currently very anxious and tearful. He communicates  reasonably well by typing into his cell phone. He has increasing neck pain. He is open to having his oncologic care transferred to [facility]. On exam there is bulky tumor in the low anterior neck with dermal extension around the trach stoma with associated serous discharge. Lungs with course rhonchi anteriorly CV tachycardic with regular rhythm. Abd obese. We will need details of RT from [place] to better understand what tumor is outside the treatment plan. Nevertheless with extension into the mediastinum with bulky paratracheal tumor, Arthur is not longer a curative situation. For immediate palliation, I would hope that the Med Onc team might consider augmented cytotoxic chemotherapy, but tailoring to other targeted agents or even consideration of immunotherapy might be reasonable. Doubtful that Arthur is an HPV associated SCC, but prior [facility] pathology does not indicate any testing for p16. At the same time, there may be consideration of further palliative RT to the bulky tumor contiguous from the larynx to the paratracheal neck and superior mediastinal tissues, which will require a VMAT technique to control dose exposures to the spinal cord, lungs, and brachial plexus. In Arthur setting, a hypofractionated or perhaps a conventionally fractionated split course sandwiched with systemic therapy might be reasonable. Arthur case will be discussed with Dr. Laurina Bustle in Med Onc and Dr. Manuella Ghazi in my own department to help facilitate further therapeutic plans including [place]-simulation to do contingency planning for further RT.The patient was evaluated with the resident physician.  I had a separate face to face encounter with the patient which included obtaining my own history, performing a physical examination of the patient, as well as reviewing the patient's medical records, pathology, and imaging studies. The case was discussed with the resident and I agree with the resident's findings, incorporating my own my findings, impression, and plan with Arthur final note.  Gerrianne Scale, MD

## 2020-10-13 NOTE — Other
Pt has been transferred to Medical Oncology Service.Patient Relations have been directed to contact primary team for a meeting that has been requested by family.Medicine Consult will sign off.Shelda Jakes, MD

## 2020-10-14 ENCOUNTER — Inpatient Hospital Stay: Admit: 2020-10-14 | Payer: PRIVATE HEALTH INSURANCE

## 2020-10-14 ENCOUNTER — Inpatient Hospital Stay: Admit: 2020-10-14 | Discharge: 2020-10-14 | Payer: MEDICARE

## 2020-10-14 DIAGNOSIS — C329 Malignant neoplasm of larynx, unspecified: Secondary | ICD-10-CM

## 2020-10-14 DIAGNOSIS — J9601 Acute respiratory failure with hypoxia: Secondary | ICD-10-CM

## 2020-10-14 LAB — CBC WITH AUTO DIFFERENTIAL
BKR WAM ABSOLUTE IMMATURE GRANULOCYTES.: 0.03 x 1000/ÂµL (ref 0.00–0.30)
BKR WAM ABSOLUTE LYMPHOCYTE COUNT.: 0.41 x 1000/ÂµL — ABNORMAL LOW (ref 0.60–3.70)
BKR WAM ABSOLUTE NRBC (2 DEC): 0 x 1000/ÂµL (ref 0.00–1.00)
BKR WAM ANALYZER ANC: 5.87 x 1000/ÂµL (ref 2.00–7.60)
BKR WAM BASOPHIL ABSOLUTE COUNT.: 0.01 x 1000/ÂµL (ref 0.00–1.00)
BKR WAM BASOPHILS: 0.1 % (ref 0.0–1.4)
BKR WAM EOSINOPHIL ABSOLUTE COUNT.: 0.34 x 1000/ÂµL (ref 0.00–1.00)
BKR WAM EOSINOPHILS: 4.6 % (ref 0.0–5.0)
BKR WAM HEMATOCRIT (2 DEC): 24.1 % — ABNORMAL LOW (ref 38.50–50.00)
BKR WAM HEMOGLOBIN: 7.7 g/dL — ABNORMAL LOW (ref 13.2–17.1)
BKR WAM IMMATURE GRANULOCYTES: 0.4 % (ref 0.0–1.0)
BKR WAM LYMPHOCYTES: 5.6 % — ABNORMAL LOW (ref 17.0–50.0)
BKR WAM MCH (PG): 27.9 pg (ref 27.0–33.0)
BKR WAM MCHC: 32 g/dL (ref 31.0–36.0)
BKR WAM MCV: 87.3 fL (ref 80.0–100.0)
BKR WAM MONOCYTE ABSOLUTE COUNT.: 0.71 x 1000/ÂµL (ref 0.00–1.00)
BKR WAM MONOCYTES: 9.6 % (ref 4.0–12.0)
BKR WAM MPV: 12.5 fL — ABNORMAL HIGH (ref 8.0–12.0)
BKR WAM NEUTROPHILS: 79.7 % — ABNORMAL HIGH (ref 39.0–72.0)
BKR WAM NUCLEATED RED BLOOD CELLS: 0 % (ref 0.0–1.0)
BKR WAM PLATELETS: 37 x1000/ÂµL — ABNORMAL LOW (ref 150–420)
BKR WAM RDW-CV: 14.6 % (ref 11.0–15.0)
BKR WAM RED BLOOD CELL COUNT.: 2.76 M/ÂµL — ABNORMAL LOW (ref 4.00–6.00)
BKR WAM WHITE BLOOD CELL COUNT: 7.4 x1000/ÂµL (ref 4.0–11.0)

## 2020-10-14 LAB — VANCOMYCIN, PEAK     (BH GH L LMW YH): BKR VANCOMYCIN PEAK: 41.8 ug/mL

## 2020-10-14 LAB — BASIC METABOLIC PANEL
BKR ANION GAP: 10 (ref 7–17)
BKR BLOOD UREA NITROGEN: 26 mg/dL — ABNORMAL HIGH (ref 6–20)
BKR BUN / CREAT RATIO: 18.1 (ref 8.0–23.0)
BKR CALCIUM: 9.2 mg/dL (ref 8.8–10.2)
BKR CHLORIDE: 100 mmol/L (ref 98–107)
BKR CO2: 27 mmol/L (ref 20–30)
BKR CREATININE: 1.44 mg/dL — ABNORMAL HIGH (ref 0.40–1.30)
BKR EGFR (AFR AMER): >60 mL/min/{1.73_m2} (ref >60)
BKR EGFR (NON AFRICAN AMERICAN): 51 mL/min/{1.73_m2} (ref >60)
BKR GLUCOSE: 266 mg/dL — ABNORMAL HIGH (ref 70–100)
BKR POTASSIUM: 4.1 mmol/L (ref 3.3–5.3)
BKR SODIUM: 137 mmol/L (ref 136–144)

## 2020-10-14 LAB — PROTEIN ELECTROPHORESIS, URINE, RANDOM     (BH GH LMW YH)
BKR ALBUMIN (EP), URINE, RANDOM: 0.04 g/L
BKR ALPHA-1 GLOBULINS, URINE, RANDOM: 0.04 g/L
BKR ALPHA-2 GLOBULINS, URINE, RANDOM: 0.02 g/L
BKR BETA GLOBULINS, URINE, RANDOM: 0.02 g/L
BKR GAMMA GLOBULINS, URINE, RANDOM: 0.02 g/L

## 2020-10-14 LAB — MAGNESIUM: BKR MAGNESIUM: 1.9 mg/dL (ref 1.7–2.4)

## 2020-10-14 LAB — VANCOMYCIN, TROUGH: BKR VANCOMYCIN TROUGH: 14.5 ug/mL (ref 10.0–15.0)

## 2020-10-14 LAB — IMMATURE PLATELET FRACTION (BH GH LMW YH)
BKR WAM IPF, ABSOLUTE: 2.7 x1000/ÂµL (ref <20.0)
BKR WAM IPF: 7.4 % (ref 1.2–8.6)

## 2020-10-14 MED ORDER — FENTANYL (PF) 50 MCG/ML INJECTION SOLUTION
50 mcg/mL | Freq: Once | INTRAVENOUS | Status: CP
Start: 2020-10-14 — End: ?
  Administered 2020-10-15: 05:00:00 50 mL via INTRAVENOUS

## 2020-10-14 MED ORDER — ATROPINE 1 % SUBLINGUAL SOLUTION
1 % | SUBLINGUAL | Status: DC
Start: 2020-10-14 — End: 2020-10-16
  Administered 2020-10-15 – 2020-10-16 (×9): 1 mL via SUBLINGUAL

## 2020-10-14 MED ORDER — FENTANYL (PF) 50 MCG/ML INJECTION SOLUTION
50 mcg/mL | Freq: Once | INTRAVENOUS | Status: CP
Start: 2020-10-14 — End: ?
  Administered 2020-10-15: 04:00:00 50 mL via INTRAVENOUS

## 2020-10-14 MED ORDER — ALBUTEROL SULFATE HFA 90 MCG/ACTUATION AEROSOL INHALER
90 mcg/actuation | RESPIRATORY_TRACT | Status: DC
Start: 2020-10-14 — End: 2020-10-14

## 2020-10-14 MED ORDER — FENTANYL (SUBLIMAZE) 2500 MCG/50 ML INFUSION (PYXIS)
INTRAVENOUS | Status: DC
Start: 2020-10-14 — End: 2020-10-16
  Administered 2020-10-15 – 2020-10-16 (×3): 50.000 mL/h via INTRAVENOUS

## 2020-10-14 MED ORDER — PROPOFOL 10 MG/ML INTRAVENOUS EMULSION
10 mg/mL | Status: CP
Start: 2020-10-14 — End: ?
  Administered 2020-10-15: 09:00:00 10 mL/h via INTRAVENOUS

## 2020-10-14 MED ORDER — DEXMEDETOMIDINE 400 MCG/100 ML (4 MCG/ML) IN 0.9 % SODIUM CHLORIDE IV
4001004 mcg/100 mL (4 mcg/mL) | INTRAVENOUS | Status: DC
Start: 2020-10-14 — End: 2020-10-16
  Administered 2020-10-15: 04:00:00 400 mL/h via INTRAVENOUS

## 2020-10-14 MED ORDER — ALBUTEROL SULFATE 2.5 MG/3 ML (0.083 %) SOLUTION FOR NEBULIZATION
2.5 mg /3 mL (0.083 %) | RESPIRATORY_TRACT | Status: DC
Start: 2020-10-14 — End: 2020-10-15
  Administered 2020-10-15: 02:00:00 2.5 mL via RESPIRATORY_TRACT

## 2020-10-14 MED ORDER — ROCURONIUM 10 MG/ML INTRAVENOUS SOLUTION
10 mg/mL | Status: CP
Start: 2020-10-14 — End: ?
  Administered 2020-10-25: 18:00:00 10 mg/mL via INTRAVENOUS

## 2020-10-14 MED ORDER — CEFTRIAXONE IV PUSH 1000 MG VIAL & NS (ADULTS)
INTRAVENOUS | Status: CP
Start: 2020-10-14 — End: ?
  Administered 2020-10-15 – 2020-10-17 (×3): 10.000 mL via INTRAVENOUS

## 2020-10-14 MED ORDER — PROPOFOL 1,000 MG/100 ML IV INFUSION
1000 mg/100 mL | INTRAVENOUS | Status: DC
Start: 2020-10-14 — End: 2020-10-16
  Administered 2020-10-15 – 2020-10-16 (×9): via INTRAVENOUS

## 2020-10-14 MED ORDER — INSULIN LISPRO (U-100) 100 UNIT/ML SUBCUTANEOUS SOLUTION
100 unit/mL | SUBCUTANEOUS | Status: DC
Start: 2020-10-14 — End: 2020-10-15
  Administered 2020-10-14 – 2020-10-15 (×2): 100 mL via SUBCUTANEOUS

## 2020-10-14 MED ORDER — CHLORHEXIDINE GLUCONATE 0.12 % MOUTHWASH
0.12 % | Freq: Two times a day (BID) | OROMUCOSAL | Status: DC
Start: 2020-10-14 — End: 2020-10-19
  Administered 2020-10-15 – 2020-10-19 (×10): 0.12 mL via OROMUCOSAL

## 2020-10-14 MED ORDER — FENTANYL BOLUS FROM INFUSION BAG
INTRAVENOUS | Status: DC | PRN
Start: 2020-10-14 — End: 2020-10-16
  Administered 2020-10-16: 05:00:00 50.000 mL via INTRAVENOUS

## 2020-10-14 MED ORDER — INSULIN NPH ISOPHANE U-100 HUMAN 100 UNIT/ML SUBCUTANEOUS SUSPENSION
100 unit/mL | Freq: Every evening | SUBCUTANEOUS | Status: DC
Start: 2020-10-14 — End: 2020-10-15
  Administered 2020-10-15: 03:00:00 100 mL via SUBCUTANEOUS

## 2020-10-14 MED ORDER — ROCURONIUM 10 MG/ML INTRAVENOUS SOLUTION
10 mg/mL | Freq: Once | INTRAVENOUS | Status: CP
Start: 2020-10-14 — End: ?
  Administered 2020-10-15: 05:00:00 10 mL via INTRAVENOUS

## 2020-10-14 MED ORDER — FENTANYL (PF) 50 MCG/ML INJECTION SOLUTION
50 mcg/mL | Status: CP
Start: 2020-10-14 — End: ?
  Administered 2020-10-15: 05:00:00 50 mcg/mL via INTRAVENOUS

## 2020-10-14 MED ORDER — IOHEXOL (OMNIPAQUE) 350 MG/ML ORAL SOLUTION 25 ML IN WATER 900 ML
Freq: Once | ORAL | Status: CP
Start: 2020-10-14 — End: ?
  Administered 2020-10-14: 21:00:00 900.000 mL via ORAL

## 2020-10-14 MED ORDER — VANCOMYCIN IVPB (2 G IN 500ML NS)
INTRAVENOUS | Status: DC
Start: 2020-10-14 — End: 2020-10-15
  Administered 2020-10-15: 06:00:00 500.000 mL/h via INTRAVENOUS

## 2020-10-14 MED ORDER — LORAZEPAM 2 MG/ML INJECTION SOLUTION
2 mg/mL | Freq: Once | INTRAVENOUS | Status: CP
Start: 2020-10-14 — End: ?
  Administered 2020-10-14: 19:00:00 2 mL via INTRAVENOUS

## 2020-10-14 MED ORDER — SODIUM CHLORIDE 0.45 % INTRAVENOUS SOLUTION
0.45 % | INTRAVENOUS | Status: DC
Start: 2020-10-14 — End: 2020-10-15

## 2020-10-14 MED ORDER — INSULIN LISPRO (U-100) 100 UNIT/ML SUBCUTANEOUS SOLUTION
100 unit/mL | SUBCUTANEOUS | Status: DC
Start: 2020-10-14 — End: 2020-10-15

## 2020-10-14 MED ORDER — LORAZEPAM 2 MG/ML INJECTION SOLUTION
2 mg/mL | Status: CP
Start: 2020-10-14 — End: ?
  Administered 2020-10-20: 13:00:00 2 mg/mL via INTRAVENOUS

## 2020-10-14 MED ORDER — SODIUM CHLORIDE 3 % FOR NEBULIZATION
3 % | Freq: Three times a day (TID) | RESPIRATORY_TRACT | Status: DC
Start: 2020-10-14 — End: 2020-10-15
  Administered 2020-10-14 – 2020-10-15 (×2): 3 mL via RESPIRATORY_TRACT

## 2020-10-14 MED ORDER — INSULIN LISPRO (U-100) 100 UNIT/ML SUBCUTANEOUS SOLUTION
100 unit/mL | SUBCUTANEOUS | Status: DC
Start: 2020-10-14 — End: 2020-10-15
  Administered 2020-10-14 – 2020-10-15 (×2): 100 mL via SUBCUTANEOUS

## 2020-10-14 NOTE — Plan of Care
[place] [facility]		Spiritual Care NotePurpose: Referral Source: Nurse Observation: People present/Information Obtained From: Patient Emotional Mood: Vulnerable, Withdrawn, Flat Affect       Subjective: A consult was placed for this patient.  Reason for the consult, patient request, difficult diagnosis/prognosis.  Arthur [name] presented as calm, withdrawn.  Because trach'ed not able to speak.  He reached out to hold my hand communicating that touch is important to him.  Read Scripture aloud.  We reflected on God care for [name] as revealed through Scripture.  Offered prayer and blessing.  No further needs expressed.Spiritual Assessment:Referral Source: Nurse Information Obtained From: Patient Mood: Vulnerable, Withdrawn, Flat Affect     Spiritual Resources: Prayer, Clergy VisitsHelpful Religious Practices: Shared Prayer, Scripture Reading          Spiritual Interventions:Spiritual Intervention Index**Date of Spiritual Visit: 02/09/22Visit Type: Follow Up VisitLanguage or special accommodation rendered?: NoHow Well Do You Speak English?: very wellIntervention Type: Spiritual VisitResponding Chaplain: Unit ChaplainReligious Needs: PrayerSpiritual/Religious Support Provided: Bible/Religious Reading, Companionship, PrayerOutcome: OUTCOMES: Expressed Spiritual/Religious Resources or Distress: Utilized spiritual resources/practices     OUTCOMES: Progressed Emotionally or Physically: Increased comfort     Plan: Remain available for support.   Total Consult Time: 24 minutes	Signed: Sharol Roussel, Spiritual Care DepartmentMHB [phone] 2/9/20223:25 PMPlan of Care Overview/ Patient Status

## 2020-10-14 NOTE — Progress Notes
Otolaryngology Progress Note:Arthur [name] is a 55 y.o. 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, trach/PEG transferred from [place] due to SOB w/exam significant for tumor invading the trach stoma and scope showing partial obstruction.Interim History:- NAEO - Denies difficulty breathing, on trach mask- Pain controlled- [place] neck with large necrotic laryngeal mass centered around thyroid cartilage, level 3/4 LAD. R IJ thrombosis. - [place] chest with mediastinal LAD, bl consolidations c/f aspirationVitals:Temp:  [97.1 ?F (36.2 ?C)-98.6 ?F (37 ?C)] (P) 98.3 ?F (36.8 ?C)Pulse:  [91-120] 100Resp:  [11-31] 17BP: (123-177)/(61-114) 128/79SpO2:  [92 %-99 %] 96 %Device (Oxygen Therapy): humidified;tracheostomy collarO2 Flow (L/min):  [6-8] 6I/O last 3 completed shifts:In: 1269.4 [I.V.:865.9; IV Piggyback:403.6]Out: 2250 [Urine:2250]Physical Exam:Gen: Awake, alert, NADEyes: EOMIFace: Facial movements symmetric, V1-3 intactNose: clear anteriorlyOP/OC: no masses or lesions visualized, no bleeding, moist mucosaNeck: Bivona 8 cuffed secured at 11 w/soft trach ties, necrotic but hemostatic mass surrounding the stoma, remaining neck flat and softPulm: normal WOB on trach maskRecent Labs Lab 02/04/222346 02/04/222346 02/05/220511 WBC 16.2*   < > 16.5* HGB 8.2*   < > 8.9* HCT 25.70*   < > 27.60* MCV 86.8   < > 87.1 PLT 149*  --  149*  < > = values in this interval not displayed. Recent Labs Lab 02/04/222346 02/04/222346 02/05/220511 02/05/220710 02/05/222354 NA 134*   < > 134*  --   --  K 5.5*   < > 4.9  --   --  CL 97*   < > 98  --   --  CO2 25   < > 26  --   --  BUN 36*   < > 36*  --   --  CREATININE 1.65*   < > 1.71*  --   --  GLU 231*   < > 143*   < > 122* CALCIUM 9.7   < > 9.9  --   --  MG 2.0  --   --   --   --  PHOS 4.2  --   -- --   --   < > = values in this interval not displayed. No results for input(s): PTT, INR in the last 168 hours.Invalid input(s): PTRecent Labs Lab 02/01/220902 ALT 21 AST 12 BILITOT 0.2 ALKPHOS 83 ALBUMIN 3.0* Invalid input(s): GLUMETAssessment and Plan: Arthur [name] is a 55 y.o. 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, trach/PEG admitted for trach management.- Pain: tylenol, oxy- Diet: feeds through g-tube- Hematology input regarding IJ thrombosis - Med onc/rad onc today - patient is still undergoing therapy- Monitor Cr, electrolytes, BG- Appreciate medicine and diabetes team recs - DVT ppxNeelima Story [name], MDPGY III - Division of Otolaryngology-Head and Neck Surgery

## 2020-10-14 NOTE — Plan of Care
Plan of Care Overview/ Patient Status    1900-0700:Pt a&ox4. Per report A2 to t/p to commode--not oob this shift. BA on for safety. Ind t/p in bed, pt educated on moving in bed to prevent skin b/d. RSLP cdi, +flush, +BR running int abx. Vanc peak and trough level drawn this shift. VSS, tachy to 100;s this shift, mds aware, satting WDL on 50% 15 L heated humidified trach mask. #8 cuffed bivona trach. Trach care performed. Suctioned x2 this shift--thick green/yellow/ red streaked. Good productive cough present. Skin surrounding trach macerated. Leakage of secretions from beneath trach--team aware. LBM 2/8. Voids spon in urinal. NPO. Diabetasource Bolus feeds during day, nocturnal cont feeds at 90cc/hr sched 8p-8-a (started arounf 9 pm this shift). PEG site cdi, residuals checked q4, <60cc, returned to pt. Meds crushed through g-tube. Oral care provided this shift. C/o pain to trach site, prn oxycodone 5mg  admin x2 w +effect. AC on hold d/t low plts.  FS to be scheduled for 10a 2p, 6p, 8p, 0000 to align w feed schedule and insulin admin. Pt anxious, sad/ tearful this shift, emotional support provided, spiritual consult placed. Safety maintained, call bell w/in reach.

## 2020-10-14 NOTE — Other
RRT called by primary RN for dyspnea.  Patient with relavant hx of SCC of larynx s/p emergent tracheotomy (07/2020), and currently on radiation therapy, s/p TAART on 10/13/2020 for mucus plugging who now presents with dyspnea.  RRT attending, primary team at bedside.  ENT involved as well.  CXR and EKG obtained.  Patient aggressively lavaged and NT suctioned by ENT, decision made to connect to vent for further ventilatory support.  Waiting for bed, SWAT at bedside.

## 2020-10-14 NOTE — Plan of Care
Problem: Adult Inpatient Plan of CareGoal: Plan of Care ReviewOutcome: Interventions implemented as appropriateGoal: Patient-Specific Goal (Individualized)Outcome: Interventions implemented as appropriateGoal: Absence of Hospital-Acquired Illness or InjuryOutcome: Interventions implemented as appropriateGoal: Optimal Comfort and WellbeingOutcome: Interventions implemented as appropriateGoal: Readiness for Transition of CareOutcome: Interventions implemented as appropriate Problem: Fall Injury RiskGoal: Absence of Fall and Fall-Related InjuryOutcome: Interventions implemented as appropriate Problem: Communication Impairment (Artificial Airway)Goal: Effective CommunicationOutcome: Interventions implemented as appropriate Problem: Device-Related Complication Risk (Artificial Airway)Goal: Optimal Device FunctionOutcome: Interventions implemented as appropriate Problem: Skin and Tissue Injury (Artificial Airway)Goal: Absence of Device-Related Skin or Tissue InjuryOutcome: Interventions implemented as appropriate Problem: Aspiration (Enteral Nutrition)Goal: Absence of Aspiration Signs and SymptomsOutcome: Interventions implemented as appropriate Problem: Device-Related Complication Risk (Enteral Nutrition)Goal: Safe, Effective Therapy DeliveryOutcome: Interventions implemented as appropriate Problem: Feeding Intolerance (Enteral Nutrition)Goal: Feeding ToleranceOutcome: Interventions implemented as appropriate Problem: Skin Injury Risk IncreasedGoal: Skin Health and IntegrityOutcome: Interventions implemented as appropriate Problem: Coping Ineffective (Oncology Care)Goal: Effective CopingOutcome: Interventions implemented as appropriate Problem: Fatigue (Oncology Care)Goal: Improved Activity ToleranceOutcome: Interventions implemented as appropriate Plan of Care Overview/ Patient Status    (0700-1900) A&O x4, anxious. SB assist OOB, BA on. T&P independently. R SL POC +flush/br, dressing CDI, capped. L#20 +flush, capped. Can be HTNsive to 130s, Tachy to 90s, MD aware. Heparin gtt held this shift d/t low plts. Generalized edema present. Bivona trach in place with humidifed trach mask @ 60% 15L O2, satting WNL. Blood streaked and creamy secretions present, pt able to use yankeur at bedside. Deep suction using 10Fr given x1 with good effect. Trach care given, ties changed this shift. LBM 2/8, voids spont in commode. PEG tube in place. Transitioned to bolus feeds this shift with diabetasource 0.5 carton @ 10, 1 carton @ 1400, and 1.5 cartons @ 1800. Residuals >200cc, Tolerated well. To start night continuous tube feed @ 8pm to 8am @ 90 mL/hr. See orders for goal TF. FS before TF, coverage given per [facility]. Macerated skin OTA under trach, otherwise skin intact. Pain to trach site oxycodone 10mg  Q4H given x2 with good effect. Ativan given x1 for anxiety with fair effect. Updated niece this shift. Safety maintained, frequent rounding, will continue to monitor.Addendum 1600: Pt stated SOB. This RN to bedside with pt tripoding, profusely coughing with no secretion production. Attempted to deep suction pt with 76fr with no pass. Pt satting 90-100% with HR 130-140s. Pt severely anxious, attempted to pull trach out 2x. Pt intermittently desatted to 60s% TART code called. ENT and Respiratory to bedside. Saline lavage given x2, suctioned with 10 fr, ambu bag given with +effect. Pt on 60% 15L O2, satting @100 %. Will continue to monitor.

## 2020-10-14 NOTE — Consults
VANCOMYCIN LEVEL EVALUATIONCurrent Vancomycin Order: 2 g IV every 24 hours. Day of Therapy: 5Vancomycin Indication: Suspected MRSA infectionEstimated Creatinine Clearance: 67 mL/min (A) (by C-G formula based on SCr of 1.44 mg/dL (H)).Creatinine (mg/dL) Date Value 56/21/3086 1.44 (H) 10/13/2020 1.61 (H) 10/12/2020 1.72 (H)  Renal Function: Scr improving Level: Lab Results Last 72 Hours Component Value Date/Time  Vancomycin Trough 14.5 10/13/2020 09:00 PM Type of Level: Trough; Level drawn appropriately? YesBased on vancomycin level obtained, recommend:  Continue current regimenRepeat Level: Patient has not yet reached steady state. The pharmacist will follow daily and schedule level per protocol. ID/AST Consulted? No [facility] Vancomycin Dosing GuidelineBH Vancomycin Dosing GuidelineFor questions, please contact the pharmacist: Clabe Seal, PharmD    Phone/Mobile Heartbeat: MHB

## 2020-10-14 NOTE — Consults
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 Last oncology visit was on 2/1 and he was noted to have necrotic mass surrounding trach stoma. No imaging as patient could not lie flat. He presented with SOB and was initially admitted to [facility] on 2/3. They had difficulty with deep suction of trach so he was transferred to [facility].  He had [place] soft tissue neck on 2/5 which showed the large necrotic laryngeal mass. It also incidentally noted a RIJ thrombosis. He has a R chest wall port. He has never had a blood clot before, has never been on blood thinners, and there is no note of prior episodes of significant bleeding or hemorrhage. Past Medical History: Diagnosis Date ? Diabetes mellitus (HC Code) (HC CODE)  ? Hypercholesteremia  ? Hypertension  No past surgical history on file.Family History Problem Relation Age of Onset ? Aneurysm Mother  Social History Socioeconomic History ? Marital status: Legally Separated   Spouse name: Not on file ? Number of children: Not on file ? Years of education: Not on file ? Highest education level: Not on file Occupational History ? Not on file Tobacco Use ? Smoking status: Former Smoker   Packs/day: 1.00   Years: 30.00   Pack years: 30.00   Quit date: 03/04/2013   Years since quitting: 7.6 Substance and Sexual Activity ? Alcohol use: Yes   Alcohol/week: 14.0 standard drinks   Types: 14 Cans of beer per week ? Drug use: Not on file ? Sexual activity: Not on file Other Topics Concern ? Not on file Social History Narrative ? Not on file Social Determinants of Health Financial Resource Strain:  ? Difficulty of Paying Living Expenses:  Food Insecurity:  ? Worried About Programme researcher, broadcasting/film/video in the Last Year:  ? Barista in the Last Year:  Transportation Needs:  ? Freight forwarder (Medical):  ? Lack of Transportation (Non-Medical):  Physical Activity:  ? Days of Exercise per Week:  ? Minutes of Exercise per Session:  Stress:  ? Feeling of Stress :  Social Connections:  ? Frequency of Communication with Friends and Family:  ? Frequency of Social Gatherings with Friends and Family:  ? Attends Religious Services:  ? Active Member of Clubs or Organizations:  ? Attends Banker Meetings:  ? Marital Status:  Intimate Partner Violence:  ? Fear of Current or Ex-Partner:  ? Emotionally Abused:  ? Physically Abused:  ? Sexually Abused:   Allergies as of 10/07/2020 ? (No Known Allergies) Exam: BP 123/78  - Pulse (!) 101  - Temp 98.7 ?F (37.1 ?C) (Oral)  - Resp (!) 28  - Ht 6' 2 (1.88 m)  - Wt (!) 136.2 kg  - SpO2 95%  - BMI 38.55 kg/m? Did not examine patient Labs: WBC 10.6Hgb 7.9Plts 83Cr 1.74Imaging: 2/6 [place] Soft Tissue NeckLarge transspatial necrotic laryngeal mass with stomal invasion and involvement of the thyroid gland, trachea, hypopharynx and proximal cervical esophagus as above. 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 Assessment & Recommendations: 55 yo man with laryngeal cancer on concurrent cisplatin + RT, s/p trach and PEG, CKD 3, admitted for trach management. He is incidentally noted to  have RIJ thrombus. This is his first VTE. It may be provoked in the setting of central catheter but also likely related to his active malignancy. If no contraindication from surgical or radiation side, he should be on [facility]. Short-term, I recommend heparin gtt especially to watch for unexpected bleeding in the setting of necrotic tumor in airway that is getting radiated and if he may have upcoming procedures for trach. Long-term options are DOACs, lovenox, or warfarin. Lovenox would be preferred option even in light of CKD as it is more easily reversible. DOAC would be an option, specifically apixaban in CKD, but DOAC would be harder to reverse in case of bleeding. - obtain RUE U/S to better evaluate RIJ thrombus- if U/S confirms thrombus and if cleared from ENT and radiation oncology teams to receive [facility], would start high risk heparin gtt without bolus- obtain prior [place] imaging for comparison of RIJ- in future, if he tolerates heparin gtt, can transition to lovenoxPatient was discussed with consult attending Dr. Dahlia Client. Please reach out via MHB with any questions. Marinell Blight, MD, MPHHematology/Oncology Fellow, PGY-5February 6, 2022 ATTENDING ADDENDUM: On 10/11/20, I reviewed Mr. Crumpler chart in detail and discussed his case including plan of care extensively with the fellow Dr. Marinell Blight with whose documentation above I agree. Our recommendations were communicated directly to patient's team on the day of evaluation. Mr. Hoback is a 55 yo gentleman followed by Dr. Harlen Labs at our [facility] in [place] for a history of Waldenstrom macroglobulinemia s/p bendamustine/rituximab x 6 cycles in 2014 with CR, ITP, and laryngeal squamous cell carcinoma diagnosed in 06/2020 and managed with concurrent cisplatin + RT beginning 09/14/20. He is s/p trach and PEG and also has a history of CKD stage 3. He was admitted initially to [facility] due to SOB and then transferred to [facility] on the ENT service for trach management. He was recently noted to have a necrotic mass surrounding his trach stoma but could not get imaging as an outpatient due to inability to lie flat, on admission here he had a [place] soft tissue neck on 10/10/20 which showed a large necrotic laryngeal mass (see above but with stomach invasion, involvement of thyroid gland, trachea, hypopharynx, and proximately cervical esophagus with associated necrotic L level 3 and 4 LAD) and incidentally found a right IJ thrombus for which our Hematology consult service was involved. Note is made that Mr. Calvert has a R chest wall port so this would be considered a line-associated thrombus. He has no personal hx of arterial or venous thrombosis. As above, provoking factors likely associated with R IJ thrombus in Mr. Weisensel case include central catheter as well as active malignancy. I would first recommend he have RUE ultrasound to confirm R IH thrombus and can get prior [place] imaging to confirm this finding is acute. Then given his large necrotic mass/LAD and potential risk for bleeding, would recommend discussion between ENT and radiation oncology teams regarding their recommendations/acceptability for anticoagulation therapy. If he can be anticoagulated would recommend high bleeding risk protocol heparin gtt without bolus, could then consider plan for transition to lovenox vs DOAC pending his clinical course. Mr. Pak has had a mild worsening normocytic anemia likely due to chemotherapy, his platelets are decreased from the last two days now at 83K but similar to prior, chart hx documents ITP wit previous tx with steroids and IVIG though immature platelet fraction currently low. Will need to monitor his blood cell counts closely especially if starting on  AC, would recommend sending the following: absolute reticulocyte count, LDH, haptoglobin, blood smear, iron studies including ferritin, vitamin B12, MMA, folate, copper. IF worsening thrombocytopenia, please also check coags including PTT/PT/INR and fibrinogen. Due to COVID-19 restrictions, I was unable to physically examine or speak with the patient face-to-face. All recommendations are based on chart review and discussion with the primary team. Detailed recommendations were communicated to the primary team both verbally and through this written note. I spent 40 minutes providing medical consultative discussion and review.Provider location: In-hospital Talitha Givens, MDAttending, HematologySmilow Cancer Hospital/Finesville Cancer CenterMHB: [phone]: [phone]

## 2020-10-14 NOTE — Other
Oncology AttendingLaryngeal cancerShortness of breath at rest.Chronic [facility] diagnosed venous thromboembolism of internal jugular veinI discussed his case in detail with the head and neck team here and also his primary oncologist in [place].  After review of his scans, his disease has progressed locally and his treatment is no longer curable.  Therefore, a decision will need to be made whether he should proceed with palliative radiation to control local spread of the disease around the tracheostomy, or to try a higher dose of palliative chemotherapy.  His situation is also complicated by his ITP and platelet count currently 37. I would not start any chemotherapy or radiation at this time because of his platelets but also because of the need for complex radiation planning.  A discussion will be held with his family today.  If he does have incurable progressive head and neck squamous cell carcinoma which has progressed despite chemo radiation, then I estimate his prognosis to be around 1 year.I agree with his other supportive care.  On the day of this patient's encounter, a total of 35 minutes was personally spent by me.  This does not include any resident/fellow teaching time, or any time spent performing a procedural.Patient was evaluated: In person

## 2020-10-14 NOTE — Progress Notes
[facility] Oncology Inpatient Progress Note 2/8/2022Attending: Vito Backers, MDHospital Day: 4PatientCadin [name]  DOB: [birth year]Medical Record Number: [id number]  Admission Date: 10/09/2020 Subjective   Chief Complaint: SOB in setting of locally advanced L laryngeal SCCInterval HistoryOvernight:Transferred to oncology floor from ENTOutpatient Oncologist?	Name of OP oncologist: Dr. Daphene Jaeger in [place]?	Discussed Pertinent Updates with OP oncologist?: NoMedications / Allergies: Inpatient MedicationsCurrent Facility-Administered Medications Medication Dose Route Frequency Provider Last Rate Last Admin ? cefTRIAXone (ROCEPHIN) 1 g in sodium chloride 0.9 % 10 mL (100 mg/mL)  1 g IV Push Q24H Rutha Bouchard, MD   1 g at 10/13/20 1048 ? famotidine (PEPCID) tablet 20 mg  20 mg Per G Tube BID Jeanmarie Plant, MD   20 mg at 10/13/20 0827 ? [Held by provider] furosemide (LASIX) tablet 20 mg  20 mg Per G Tube Daily Jeanmarie Plant, MD   20 mg at 10/11/20 0920 ? [Held by provider] insulin glargine (Semglee,Lantus) injection 50 Units  50 Units Subcutaneous Daily Rob Bunting, MD     ? insulin regular human (HumuLIN R, NovoLIN R) Sliding Scale (See admin instructions for dose)   Subcutaneous Q6H Truddie Crumble, MD   4 Units at 10/12/20 1316 ? Prosource no carb 15 gram protein and 60 kcal  1 packet Per G Tube BID Rutha Bouchard, MD     ? rosuvastatin (CRESTOR) tablet 10 mg  10 mg Per G Tube Daily Jeanmarie Plant, MD   10 mg at 10/13/20 0827 ? sodium chloride 0.9 % flush 3 mL  3 mL IV Push Q8H Jeanmarie Plant, MD   3 mL at 10/12/20 4098 ? vancomycin (VANCOCIN) 2 g in sodium chloride 0.9% 500 mL IVPB  2 g Intravenous Q24H Rutha Bouchard, MD 250 mL/hr at 10/12/20 2317 2 g at 10/12/20 2317 ? Vancomycin MAR Level   Intravenous Once Irine Seal, MD     ? acetaminophen  650 mg Oral Q6H PRN ? albuterol 2.5 mg Nebulization Q6H PRN ? dextrose (GLUCOSE) 40 % gel 15 g  15 g Oral Q15 MIN PRN  Or ? fruit juice  120 mL Oral Q15 MIN PRN  Or ? skim milk  240 mL Oral Q15 MIN PRN ? dextrose (GLUCOSE) 40 % gel 30 g  30 g Oral Q15 MIN PRN  Or ? fruit juice  240 mL Oral Q15 MIN PRN ? dextrose 50% in water (D50W)  12.5 g IV Push Q15 MIN PRN ? dextrose 50% in water (D50W)  25 g IV Push Q15 MIN PRN ? glucagon  1 mg Intramuscular Once PRN ? LORazepam  0.5 mg Per G Tube Q6H PRN ? oxyCODONE  10 mg Per G Tube Q6H PRN ? oxyCODONE  5 mg Per G Tube Q4H PRN ? oxymetazoline  1 spray each nostril BID PRN ? sodium chloride  3 mL IV Push PRN for Line Care ? traZODone  25 mg Per G Tube Nightly PRN ? [Held by provider] D5 1/2 NS Stopped (10/13/20 0100) ? [Held by provider] heparin 25,000 units/250 ml infusion - High Risk of Bleeding 12 Units/kg/hr (10/13/20 0753) Allergies: Patient has no known allergies. Objective Vitals:Temp:  [98.2 ?F (36.8 ?C)-99.1 ?F (37.3 ?C)] 98.7 ?F (37.1 ?C)Pulse:  [89-114] 108Resp:  [17-27] 18BP: (126-157)/(39-95) 157/95SpO2:  [95 %-99 %] 97 %(!) 136.2 kg Physical Exam:Body mass index is 38.55 kg/m?Marland KitchenGeneral: in NADHEENT: anicteric scleraCardiac: RRRRespiratory: CTABExtremities: No LE edemaNeuro: alert, non-verbal in setting of trach, communicates normally through text-to-voice.Intake/Output Summary (Last 24  hours) at 10/13/2020 1121Last data filed at 10/13/2020 0344Gross per 24 hour Intake 930 ml Output 2350 ml Net -1420 ml Laboratory / Imaging: Recent Labs Lab 02/07/220140 02/07/220140 02/08/220001 02/08/220001 02/08/220424 WBC 8.1  --  7.4  --  7.2 HGB 7.5*   < > 8.3*   < > 7.8* HCT 23.20*   < > 26.60*   < > 24.10* PLT 54*   < > 42*   < > 40*  < > = values in this interval not displayed.  Recent Labs Lab 02/06/220429 02/07/220140 02/08/220424 NEUTROPHILS 86.4* 83.7* 81.6* Recent Labs Lab 02/06/220429 02/06/220759 02/07/220140 02/07/220614 02/07/222137 02/08/220424 02/08/220742 NA 136  --  137  --   --  137  --  K 4.6   < > 4.1   < >  --  3.9  --  CL 101   < > 101   < >  --  100  --  CO2 26   < > 27   < >  --  27  --  BUN 31*   < > 26*   < >  --  23*  --  CREATININE 1.74*   < > 1.72*   < >  --  1.61*  --  GLU 163*   < > 203*   < >   < > 236* 270* ANIONGAP 9   < > 9   < >  --  10  --   < > = values in this interval not displayed.  Recent Labs Lab 02/04/222346 02/05/220511 02/06/220429 02/07/220140 02/08/220424 CALCIUM 9.7   < > 9.8 9.5 9.5 MG 2.0  --   --   --   --  PHOS 4.2  --   --   --   --   < > = values in this interval not displayed.  Recent Labs Lab 02/07/220140 ALBUMIN 2.6*  Recent Labs Lab 02/08/220001 02/08/220424 02/08/220644 PTT 23.7 41.0* 41.5* LABPROT  --  11.6  --  INR  --  1.07  --   Microbiology: Recent Results (from the past 168 hour(s)) Urine microscopic     (BH GH LMW YH)  Collection Time: 10/13/20  3:43 AM Result Value Ref Range  Manual Microscopic Performed   Epithelial Cells Few None-Few /LPF  Hyaline Casts, UA 0-3 0 - 3 /LPF  WBC/HPF 0-2 0 - 5 /HPF  RBC/HPF 0-2 0 - 2 /HPF  Bacteria, UA None None-Few /HPF Urinalysis-macroscopic w/reflex microscopic  Collection Time: 10/13/20  3:43 AM Result Value Ref Range  Clarity, UA Clear Clear  Color, UA Yellow Yellow  Specific Gravity, UA 1.018 1.005 - 1.030  pH, UA 6.0 5.5 - 7.5  Protein, UA 1+ (A) Negative-Trace  Glucose, UA Negative Negative  Ketones, UA Negative Negative  Blood, UA Negative Negative  Bilirubin, UA Negative Negative  Leukocytes, UA Negative Negative  Nitrite, UA Negative Negative  Urobilinogen, UA <2.0 <=2.0 EU/dL Urine microscopic     (BH GH LMW YH)  Collection Time: 10/11/20 12:18 PM Result Value Ref Range  Epithelial Cells None None-Few /LPF  Hyaline Casts, UA 0 0 - 3 /LPF  Bacteria, UA None None-Few /HPF  WBC/HPF, UA 1 0 - 5 /HPF  RBC/HPF, UA 1 0 - 2 /HPF  Imaging:[place] Chest with IV contrast (10/10/2020):Partially visualized soft tissue neck mass which is also characterized on the concurrent neck [place]. There is mediastinal adenopathy and circumferential thickening of the trachea concerning for metastatic disease.?Bilateral dependent consolidations likely represent aspiration. ?Pulmonary nodules  measuring up to 6 mm which are nonspecific but should be followed on future imaging per oncology protocol to exclude metastatic disease.[place] Neck with IV contrast (10/10/2020):Large transspatial necrotic laryngeal mass with stomal invasion and involvement of the thyroid gland, trachea, hypopharynx and proximal cervical esophagus as above. Associated necrotic left level 3 and 4 lymphadenopathy as above.BUE dopplers (10/12/2020): no evidence of DVTsRenal U/S (10/12/2020): No hydronephrosis Assessment Arthur [name] is a 55 y.o. male with a hx of chronic ITP, Waldenstrom macroglobulinemia, HTN, HLD, DM, tobacco and EtOH use, and T3NxMx L Laryngeal SCC (on weekly cisplatin and RT) s/p trach/PEG transferred from [place] on 2/4 in setting of SOB 2/2 partial obstruction from new necrotic mass overlying his trach site. Patient admitted to ENT service and underwent trach exchange on 10/10/20, now transferred to the oncology floor for further management. Incidentally found to have L IJ thrombus. Was started on heparin gtt briefly 10/12/20 evening, holding due to platelets <50. Was started on Vanc/Zosyn empirically given leukocytosis on admission. Leukocytosis has resolved. Narrowed Zosyn to ceftriaxone on arrival to the onc floor, continuing Vanc given positive MRSA swab. Patient is hemodynamically stable on trach mask (28%, 6L). Adjusting his insulin regimen per diabetes team recommendations.  Plan Problem-Based Plan: #L Laryngeal SCC: s/p trach exchange on 10/10/20 and biopsy on 10/12/20. Radiation oncology working on getting full rad onc  records from [facility]- radiation oncology consulted, recs appreciated- f/u path from 2/7- transitioned Zosyn to ceftriaxone, continuing Vanc #L IJ Thrombus: incidentally found on [place]. BUE dopplers with no DVT- holding AC in setting of platelets <50#T2DM: on bolus feeds and cycled feeds overnight. TF currently at goal.- diabetes team following, recs appreciated- start NPH 24 units q 8pm- discontinue glargine- start lispro 2 units at 10 am- start lispro 5 units at 2 PM- start lispro mid-dose SSI scheduled for 10 AM, 2 PM, and 6 PM- lispro mid-dose SSI at midnight- continue Pepcid 20 BID#CKD: Baseline Cr (1.5-1.6). Renal U/S with no hydronephrosis- Trend Cr#Thrombocytopenia#Chronic ITP- transfuse platelets <50 if bleeding, <10 if no active bleeding#HTN- holding home lisinopril- holding home Lasix#HLD- continuing home rosuvastatinHospital Care:?	Nutrition: TF?	Prophylaxis: holding heparin gtt in setting of platelets <50?	Discharge Planning Needs: facility, will [facility]?	Code Status: Full CodeI have personally discussed the plan of care with the patient. YesI have personally updated family regarding the patient?s admission. YesI have reviewed the most pertinent aspects of the current plan of care with the bedside nurse: yes Electronically Signed:Tomio Kirk Charyl Bigger, MD PGY-1, Neurology Prelim2/04/2021

## 2020-10-14 NOTE — Progress Notes
[facility] Oncology Inpatient Progress Note 2/9/2022Attending: Arcelia Jew Jos*Hospital Day: 5PatientMcguire [name]  DOB: [birth year]Medical Record Number: [id number]  Admission Date: 10/09/2020 Subjective   Chief Complaint: SOB in setting of locally advanced L laryngeal SCCInterim- GOC discussion today, informed that goal of treatment is palliation.Medications / Allergies: Inpatient MedicationsCurrent Facility-Administered Medications Medication Dose Route Frequency Provider Last Rate Last Admin ? famotidine (PEPCID) tablet 20 mg  20 mg Per G Tube BID Jeanmarie Plant, MD   20 mg at 10/14/20 1050 ? [Held by provider] furosemide (LASIX) tablet 20 mg  20 mg Per G Tube Daily Jeanmarie Plant, MD   20 mg at 10/11/20 0920 ? insulin lispro (Admelog, HumaLOG) 100 unit/mL injection 14 Units  14 Units Subcutaneous Daily Schumitz, [place], [place]     ? insulin lispro (Admelog, HumaLOG) 100 unit/mL injection 14 Units  14 Units Subcutaneous Daily Schumitz, [place], [place]     ? insulin lispro (Admelog, HumaLOG) 100 unit/mL injection 8 Units  8 Units Subcutaneous Daily Yevonne Aline, [place]   8 Units at 10/14/20 1100 ? insulin lispro (Admelog, HumaLOG) Sliding Scale (See admin instructions for dose)   Subcutaneous Daily Abdalla, Parks Neptune, MD     ? insulin lispro (Admelog, HumaLOG) Sliding Scale (See admin instructions for dose)   Subcutaneous 3 times per day Ria Clock, MD   6 Units at 10/14/20 1100 ? insulin NPH human (HumuLIN N,NovoLIN) injection 28 Units  28 Units Subcutaneous Nightly Schumitz, [place], [place]     ? Prosource no carb 15 gram protein and 60 kcal  1 packet Per G Tube BID Rutha Bouchard, MD   1 packet at 10/14/20 1059 ? rosuvastatin (CRESTOR) tablet 10 mg  10 mg Per G Tube Daily Jeanmarie Plant, MD   10 mg at 10/14/20 1050 ? sodium chloride 0.9 % flush 3 mL  3 mL IV Push Q8H Jeanmarie Plant, MD   3 mL at 10/12/20 4098 ? vancomycin (VANCOCIN) 2 g in sodium chloride 0.9% 500 mL IVPB  2 g Intravenous Q24H Rutha Bouchard, MD 250 mL/hr at 10/13/20 2107 2 g at 10/13/20 2107 ? acetaminophen  650 mg Oral Q6H PRN ? albuterol  2.5 mg Nebulization Q6H PRN ? dextrose (GLUCOSE) 40 % gel 15 g  15 g Oral Q15 MIN PRN  Or ? fruit juice  120 mL Oral Q15 MIN PRN  Or ? skim milk  240 mL Oral Q15 MIN PRN ? dextrose (GLUCOSE) 40 % gel 30 g  30 g Oral Q15 MIN PRN  Or ? fruit juice  240 mL Oral Q15 MIN PRN ? dextrose 50% in water (D50W)  12.5 g IV Push Q15 MIN PRN ? dextrose 50% in water (D50W)  25 g IV Push Q15 MIN PRN ? glucagon  1 mg Intramuscular Once PRN ? LORazepam  0.5 mg Per G Tube Q6H PRN ? oxyCODONE  10 mg Per G Tube Q6H PRN ? oxyCODONE  5 mg Per G Tube Q4H PRN ? oxymetazoline  1 spray each nostril BID PRN ? sodium chloride  3 mL IV Push PRN for Line Care ? traZODone  25 mg Per G Tube Nightly PRN ? [Held by provider] heparin 25,000 units/250 ml infusion - High Risk of Bleeding 12 Units/kg/hr (10/13/20 0753) Allergies: Patient has no known allergies. Objective Vitals:Temp:  [97.4 ?F (36.3 ?C)-98.6 ?F (37 ?C)] 97.6 ?F (36.4 ?C)Pulse:  [89-123] 94Resp:  [17-20] 18BP: (132-162)/(83-106) 150/95SpO2:  [99 %-100 %] 99 %(!) 136.2  kg Physical Exam:Body mass index is 38.55 kg/m?Marland KitchenGeneral: in NADHEENT: anicteric scleraCardiac: RRRRespiratory: CTABExtremities: No LE edemaNeuro: alert, non-verbal in setting of trach, communicates normally through text-to-voice.Intake/Output Summary (Last 24 hours) at 10/14/2020 1307Last data filed at 10/14/2020 1000Gross per 24 hour Intake 1487 ml Output 1050 ml Net 437 ml Laboratory / Imaging: Recent Labs Lab 02/08/220001 02/08/220001 02/08/220424 02/08/220424 02/09/220558 WBC 7.4  --  7.2  --  7.4 HGB 8.3*   < > 7.8*   < > 7.7* HCT 26.60*   < > 24.10*   < > 24.10* PLT 42*   < > 40*   < > 37*  < > = values in this interval not displayed.  Recent Labs Lab 02/07/220140 02/08/220424 02/09/220558 NEUTROPHILS 83.7* 81.6* 79.7*  Recent Labs Lab 02/07/220140 02/07/220614 02/08/220424 02/08/220742 02/09/220558 02/09/220626 02/09/221045 NA 137  --  137  --  137  --   --  K 4.1   < > 3.9   < > 4.1  --   --  CL 101   < > 100   < > 100  --   --  CO2 27   < > 27   < > 27  --   --  BUN 26*   < > 23*   < > 26*  --   --  CREATININE 1.72*   < > 1.61*   < > 1.44*  --   --  GLU 203*   < > 236*   < > 266*   < > 287* ANIONGAP 9   < > 10   < > 10  --   --   < > = values in this interval not displayed.  Recent Labs Lab 02/04/222346 02/04/222346 02/05/220511 02/07/220140 02/08/220424 02/09/220558 CALCIUM 9.7  --    < > 9.5 9.5 9.2 MG 2.0   < >  --   --   --  1.9 PHOS 4.2  --   --   --   --   --   < > = values in this interval not displayed.  Recent Labs Lab 02/07/220140 ALBUMIN 2.6*  Recent Labs Lab 02/08/220001 02/08/220424 02/08/220644 PTT 23.7 41.0* 41.5* LABPROT  --  11.6  --  INR  --  1.07  --   Microbiology: Recent Results (from the past 168 hour(s)) Urine microscopic     (BH [facility] LM[facility] YH)  Collection Time: 10/13/20  3:43 AM Result Value Ref Range  Manual Microscopic Performed   Epithelial Cells Few None-Few /LPF  Hyaline Casts, UA 0-3 0 - 3 /LPF  WBC/HPF 0-2 0 - 5 /HPF  RBC/HPF 0-2 0 - 2 /HPF  Bacteria, UA None None-Few /HPF Urinalysis-macroscopic w/reflex microscopic  Collection Time: 10/13/20  3:43 AM Result Value Ref Range  Clarity, UA Clear Clear  Color, UA Yellow Yellow  Specific Gravity, UA 1.018 1.005 - 1.030  pH, UA 6.0 5.5 - 7.5  Protein, UA 1+ (A) Negative-Trace  Glucose, UA Negative Negative  Ketones, UA Negative Negative  Blood, UA Negative Negative  Bilirubin, UA Negative Negative  Leukocytes, UA Negative Negative  Nitrite, UA Negative Negative  Urobilinogen, UA <2.0 <=2.0 EU/dL Urine microscopic     (BH [facility] LM[facility] YH)  Collection Time: 10/11/20 12:18 PM Result Value Ref Range  Epithelial Cells None None-Few /LPF  Hyaline Casts, UA 0 0 - 3 /LPF  Bacteria, UA None None-Few /HPF  WBC/HPF, UA 1 0 - 5 /HPF  RBC/HPF, UA 1 0 - 2 /HPF  Imaging:[place]  Chest with IV contrast (10/10/2020):Partially visualized soft tissue neck mass which is also characterized on the concurrent neck [place]. There is mediastinal adenopathy and circumferential thickening of the trachea concerning for metastatic disease.?Bilateral dependent consolidations likely represent aspiration. ?Pulmonary nodules measuring up to 6 mm which are nonspecific but should be followed on future imaging per oncology protocol to exclude metastatic disease.[place] Neck with IV contrast (10/10/2020):Large transspatial necrotic laryngeal mass with stomal invasion and involvement of the thyroid gland, trachea, hypopharynx and proximal cervical esophagus as above. Associated necrotic left level 3 and 4 lymphadenopathy as above.BUE dopplers (10/12/2020): no evidence of DVTsRenal U/S (10/12/2020): No hydronephrosis Assessment Imir Brumbach is a 55 y.o. 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 Patient admitted to ENT service and underwent trach exchange on 10/10/20, now transferred to the oncology floor for further management. Incidentally found to have L IJ thrombus. Was started on heparin gtt briefly 10/12/20 evening, holding due to platelets <50. Was started on Vanc/Zosyn empirically given leukocytosis on admission. Leukocytosis has resolved. Narrowed Zosyn to ceftriaxone on arrival to the onc floor, continuing Vanc given positive MRSA swab. Patient is hemodynamically stable on trach mask (28%, 6L). Adjusting his insulin regimen per diabetes team recommendations.  Plan  Today, we spoke to patient at length and informed him that his disease is not curative. He wished to continue with radiation but refused to return to his previous facility. Therefore, we will attempt to set him up here with radiation.  Today- [facility] for staging- will try to find new facility for placement- insulin adjustments per diabetes teamProblem-Based Plan: #L Laryngeal SCC: s/p trach exchange on 10/10/20 and biopsy on 10/12/20. Radiation oncology working on getting full rad onc  records from [facility]- radiation oncology consulted, recs appreciated- f/u path from 2/7- transitioned Zosyn to ceftriaxone, continuing Vanc #L IJ Thrombus: incidentally found on [place]. BUE dopplers with no DVT- holding AC in setting of platelets <50- doac if he leaves hospital#T2DM: on bolus feeds and cycled feeds overnight. TF currently at goal.- appreciate diabetes team- continue Pepcid 20 BID#CKD: Baseline Cr (1.5-1.6). Renal U/S with no hydronephrosis- Trend Cr#Thrombocytopenia#Chronic ITP- transfuse platelets <50 if bleeding, <10 if no active bleeding#HTN- holding home lisinopril- holding home Lasix#HLD- continuing home rosuvastatinHospital Care:?	Nutrition: TF?	Prophylaxis: holding heparin gtt in setting of platelets <50?	Discharge Planning Needs: facility, will receive [facility]?	Code Status: Full CodeI have personally discussed the plan of care with the patient. YesI have personally updated family regarding the patient?s admission. YesI have reviewed the most pertinent aspects of the current plan of care with the bedside nurse: yes Electronically Signed:Demetria Iwai Reece Packer, PGY3Internal Med

## 2020-10-14 NOTE — Progress Notes
Inpatient Diabetes Management Team Follow-up Note    Over the past 24 hours, the patient's glucose control has been variable but close to goal     ? Current Nutritional Status:  Diabetisource @ 90 ml/hr --> change to:         2/8: 0.5 cartons @ 10 am, 1 carton @ 2 pm, 1.5 cartons @ 6 pm, 1       ProSource BID, cycle Diabetisource @ 90 mL/hr x 12 hrs (8 pm-8 am)  ?       2/9: 1 carton @ 10 am, 2 cartons @ 2 pm, 2 cartons @ 6 pm, 1 ProSource BID, cycle Diabetisource @ 90 mL/hr x 12 hrs (8 pm- 8 am)  ? Current Anti-hyperglycemic Regimen:  NPH 24 units q 8 pm, lispr 2 units @ 10am, lispro 5 units @ 2pm, lispro 7 units @ 6pm, lispro mid dose sliding scale ac tid, lispro mid dose sliding scale q hs   ? Home Anti-hyperglycemic Regimen: Toujeo U300 64 units daily, Apidra 20 units TID with meals, metformin 1000 mg bid   ? Other Contributing Medications: D51/2NS-- dc'd at 1am     PE:    Temp:  [97.4 ?F (36.3 ?C)-98.6 ?F (37 ?C)] 98.6 ?F (37 ?C)  Pulse:  [87-123] 93  Resp:  [17-20] 17  BP: (132-162)/(83-106) 152/98  SpO2:  [99 %-100 %] 99 %  Device (Oxygen Therapy): tracheostomy collar  O2 Flow (L/min):  [6-15] 15     Wt Readings from Last 3 Encounters:   10/09/20 (!) 136.2 kg   10/06/20 (!) 139.5 kg   09/29/20 (!) 142.4 kg       Data Review:  BGs:      Creatinine (mg/dL)   Date Value   30/86/5784 1.44 (H)     Hemoglobin A1c (%)   Date Value   10/10/2020 9.0 (H)       Assessment:  55 yo h/o type 2 diabetes, HTN, Waldenstroms macroglobulinemia, ETOH, laryngeal SCC s/p trach/PEG presented with SOB significant for tumor invading the trach stoma with partial obstruction.  Diabetes team consulted for assistance in glycemic management.      BG over the past 24 hours have been above goal. He was transitioned to nocturnal plus bolus tube feeds.  He was started on NPH for the nocturnal tube feeds and and lispro for his bolus tube feeds.  He was uptitrated on the bolus tube feeds and he will be at goal today.  Would recommend to increase the NPH and lispro dosing for the tube feeds and continue with the correction scale for meals and bedtime and change the bedtime correction scale to begin with BG 150 as he recieves nocturnal feeds.  Discussed plan with the covering provider, Dr Evorn Gong.   Based on current AACE/ADA guidelines, the goal is to maintain all BGs<180 mg/dl, ideally with pre-prandial BGs<140 mg/dl.    Recommendations:   1. Increase  NPH to 28UNITS Q 8PM AT START OF NOCTURNAL TUBE FEEDS -- do not give if tube feeds are held   2. Increase LISPRO to 8 UNITS WITH 10AM BOLUS 1 CAN - hold if bolus not given   3. Increase LISPRO to 14 UNITS WITH 2 PM BOLUS 2 CANS -- hold if bolus not given   4. Increase LISPRO to 14 UNITS WITH 6 PM BOLUS 2 CANS -- hold if bolus not given   5. Continue  Lispro mid dose sliding scale timed at 10A, 2P, 6P  6.  Change lispro mid dose sliding scale q MIDNIGHT to start with BG 150.   7. DISCONTINUE Regular insulin mid dose sliding scale q 6 hours   8. Continue blood glucose monitoring 12AM,10 AM, 2PM, 6PM  9. Diet: tube feeds per nutrition       Please let us know if you have any questions or concerns about our recommenations. We will continue to follow the patient along with you during his hospitalzation as we attempt to optimize his glycemic control.  Detailed recommendations were communicated to the primary team both verbally and/or through this written note.  I spent a total of 25 minutes with the patient of which 15 minutes (over 50%) was spent in counseling the patient regarding diabetes management and coordinating care with the primary team.     Signed:  Yevonne Aline PA   Inpatient Diabetes Management Team   [facility]: [phone]  Diabetes consult pager # [phone]      NOTE: During nights, weekends, and holidays, please contact the on-call Endocrine Fellow at pager [phone].

## 2020-10-15 ENCOUNTER — Inpatient Hospital Stay: Admit: 2020-10-15 | Payer: PRIVATE HEALTH INSURANCE

## 2020-10-15 ENCOUNTER — Telehealth: Admit: 2020-10-15 | Payer: PRIVATE HEALTH INSURANCE | Attending: Hematology & Oncology

## 2020-10-15 ENCOUNTER — Encounter: Admit: 2020-10-15 | Payer: PRIVATE HEALTH INSURANCE | Attending: Otolaryngology

## 2020-10-15 DIAGNOSIS — J9601 Acute respiratory failure with hypoxia: Secondary | ICD-10-CM

## 2020-10-15 DIAGNOSIS — C329 Malignant neoplasm of larynx, unspecified: Secondary | ICD-10-CM

## 2020-10-15 LAB — COMPREHENSIVE METABOLIC PANEL
BKR A/G RATIO: 1.2 (ref 1.0–2.2)
BKR ALANINE AMINOTRANSFERASE (ALT): 17 U/L (ref 9–59)
BKR ALBUMIN: 2.8 g/dL — ABNORMAL LOW (ref 3.6–4.9)
BKR ALKALINE PHOSPHATASE: 72 U/L (ref 9–122)
BKR ANION GAP: 10 (ref 7–17)
BKR ASPARTATE AMINOTRANSFERASE (AST): 17 U/L (ref 10–35)
BKR AST/ALT RATIO: 1
BKR BILIRUBIN TOTAL: 0.2 mg/dL (ref <=1.2)
BKR BLOOD UREA NITROGEN: 34 mg/dL — ABNORMAL HIGH (ref 6–20)
BKR BUN / CREAT RATIO: 17.3 (ref 8.0–23.0)
BKR CALCIUM: 8.8 mg/dL (ref 8.8–10.2)
BKR CHLORIDE: 98 mmol/L (ref 98–107)
BKR CO2: 28 mmol/L (ref 20–30)
BKR CREATININE: 1.96 mg/dL — ABNORMAL HIGH (ref 0.40–1.30)
BKR EGFR (AFR AMER): 43 mL/min/{1.73_m2} (ref >60)
BKR EGFR (NON AFRICAN AMERICAN): 36 mL/min/{1.73_m2} (ref >60)
BKR GLOBULIN: 2.3 g/dL (ref 2.3–3.5)
BKR GLUCOSE: 290 mg/dL — ABNORMAL HIGH (ref 70–100)
BKR POTASSIUM: 4.7 mmol/L (ref 3.3–5.3)
BKR PROTEIN TOTAL: 5.1 g/dL — ABNORMAL LOW (ref 6.6–8.7)
BKR SODIUM: 136 mmol/L (ref 136–144)

## 2020-10-15 LAB — LIPASE: BKR LIPASE: 6 U/L — ABNORMAL LOW (ref 11–55)

## 2020-10-15 LAB — CBC WITH AUTO DIFFERENTIAL
BKR WAM ABSOLUTE IMMATURE GRANULOCYTES.: 0.05 x 1000/ÂµL (ref 0.00–0.30)
BKR WAM ABSOLUTE LYMPHOCYTE COUNT.: 0.48 x 1000/ÂµL — ABNORMAL LOW (ref 0.60–3.70)
BKR WAM ABSOLUTE NRBC (2 DEC): 0 x 1000/ÂµL (ref 0.00–1.00)
BKR WAM ANALYZER ANC: 6.75 x 1000/ÂµL (ref 2.00–7.60)
BKR WAM BASOPHIL ABSOLUTE COUNT.: 0.01 x 1000/ÂµL (ref 0.00–1.00)
BKR WAM BASOPHILS: 0.1 % (ref 0.0–1.4)
BKR WAM EOSINOPHIL ABSOLUTE COUNT.: 0.19 x 1000/ÂµL (ref 0.00–1.00)
BKR WAM EOSINOPHILS: 2.3 % (ref 0.0–5.0)
BKR WAM HEMATOCRIT (2 DEC): 24 % — ABNORMAL LOW (ref 38.50–50.00)
BKR WAM HEMOGLOBIN: 7.6 g/dL — ABNORMAL LOW (ref 13.2–17.1)
BKR WAM IMMATURE GRANULOCYTES: 0.6 % (ref 0.0–1.0)
BKR WAM LYMPHOCYTES: 5.8 % — ABNORMAL LOW (ref 17.0–50.0)
BKR WAM MCH (PG): 27.9 pg (ref 27.0–33.0)
BKR WAM MCHC: 31.7 g/dL (ref 31.0–36.0)
BKR WAM MCV: 88.2 fL (ref 80.0–100.0)
BKR WAM MONOCYTE ABSOLUTE COUNT.: 0.81 x 1000/ÂµL (ref 0.00–1.00)
BKR WAM MONOCYTES: 9.8 % (ref 4.0–12.0)
BKR WAM MPV: 11.9 fL (ref 8.0–12.0)
BKR WAM NEUTROPHILS: 81.4 % — ABNORMAL HIGH (ref 39.0–72.0)
BKR WAM NUCLEATED RED BLOOD CELLS: 0 % (ref 0.0–1.0)
BKR WAM PLATELETS: 40 x1000/ÂµL — ABNORMAL LOW (ref 150–420)
BKR WAM RDW-CV: 14.9 % (ref 11.0–15.0)
BKR WAM RED BLOOD CELL COUNT.: 2.72 M/ÂµL — ABNORMAL LOW (ref 4.00–6.00)
BKR WAM WHITE BLOOD CELL COUNT: 8.3 x1000/ÂµL (ref 4.0–11.0)

## 2020-10-15 LAB — RETICULOCYTES
BKR WAM IRF: 25.4 % — ABNORMAL HIGH (ref 3.0–15.9)
BKR WAM RETICULOCYTE - ABS (3 DEC): 0.077 10Ë6 cells/uL (ref 0.023–0.140)
BKR WAM RETICULOCYTE COUNT PCT (1 DEC): 2.8 % — ABNORMAL HIGH (ref 0.6–2.7)
BKR WAM RETICULOCYTE HGB EQUIVALENT: 35.4 pg (ref 28.2–35.7)

## 2020-10-15 LAB — PHOSPHORUS     (BH GH L LMW YH): BKR PHOSPHORUS: 3.9 mg/dL (ref 2.2–4.5)

## 2020-10-15 LAB — LACTATE DEHYDROGENASE: BKR LACTATE DEHYDROGENASE: 164 U/L (ref 122–241)

## 2020-10-15 LAB — FIBRINOGEN     (BH GH L LMW YH): BKR FIBRINOGEN LEVEL: 508 mg/dL — ABNORMAL HIGH (ref 194–448)

## 2020-10-15 LAB — PT/INR AND PTT (BH GH L LMW YH)
BKR INR: 1.02 (ref 0.87–1.14)
BKR PARTIAL THROMBOPLASTIN TIME: 23.1 s (ref 23.0–31.4)
BKR PROTHROMBIN TIME: 11.1 s (ref 9.6–12.3)

## 2020-10-15 LAB — MAGNESIUM: BKR MAGNESIUM: 1.9 mg/dL (ref 1.7–2.4)

## 2020-10-15 LAB — IMMATURE PLATELET FRACTION (BH GH LMW YH)
BKR WAM IPF, ABSOLUTE: 4.3 x1000/ÂµL (ref <20.0)
BKR WAM IPF: 10.8 % — ABNORMAL HIGH (ref 1.2–8.6)

## 2020-10-15 LAB — TRIGLYCERIDES: BKR TRIGLYCERIDES: 171 mg/dL — ABNORMAL HIGH

## 2020-10-15 LAB — HAPTOGLOBIN: BKR HAPTOGLOBIN: 259 mg/dL — ABNORMAL HIGH (ref 30–200)

## 2020-10-15 MED ORDER — OXYCODONE IMMEDIATE RELEASE 5 MG TABLET
5 mg | ORAL | Status: DC | PRN
Start: 2020-10-15 — End: 2020-10-15

## 2020-10-15 MED ORDER — SODIUM CHLORIDE 0.9 % LARGE VOLUME SYRINGE FOR AUTOINJECTOR
Freq: Once | INTRAVENOUS | Status: CP | PRN
Start: 2020-10-15 — End: ?
  Administered 2020-10-15: 18:00:00 via INTRAVENOUS

## 2020-10-15 MED ORDER — LACTATED RINGERS IV BOLUS (NEW BAG)
Freq: Once | INTRAVENOUS | Status: CP
Start: 2020-10-15 — End: ?
  Administered 2020-10-15: 05:00:00 500.000 mL/h via INTRAVENOUS

## 2020-10-15 MED ORDER — VANCOMYCIN MAR LEVEL
Freq: Once | INTRAVENOUS | Status: CP
Start: 2020-10-15 — End: ?

## 2020-10-15 MED ORDER — LACTATED RINGERS IV BOLUS (NEW BAG)
Freq: Once | INTRAVENOUS | Status: CP
Start: 2020-10-15 — End: ?
  Administered 2020-10-15: 08:00:00 500.000 mL/h via INTRAVENOUS

## 2020-10-15 MED ORDER — OXYCODONE IMMEDIATE RELEASE 5 MG TABLET
5 mg | JEJUNOSTOMY | Status: DC | PRN
Start: 2020-10-15 — End: 2020-10-20
  Administered 2020-10-16 – 2020-10-20 (×12): 5 mg via JEJUNOSTOMY

## 2020-10-15 MED ORDER — SODIUM CHLORIDE 3 % FOR NEBULIZATION
3 % | RESPIRATORY_TRACT | Status: DC
Start: 2020-10-15 — End: 2020-10-19
  Administered 2020-10-15 – 2020-10-19 (×26): 3 mL via RESPIRATORY_TRACT

## 2020-10-15 MED ORDER — AMINO ACIDS-PROTEIN HYDROLYSATE 15 GRAM-60 KCAL/30 ML ORAL LIQUID PACK
15-60 gram-kcal/30 mL | Freq: Three times a day (TID) | GASTROSTOMY | Status: DC
Start: 2020-10-15 — End: 2020-10-19
  Administered 2020-10-15 – 2020-10-19 (×12): 15-60 gram-kcal/30 mL via GASTROSTOMY

## 2020-10-15 MED ORDER — PHENYLEPHRINE 40 MG/250 ML (160 MCG/ML) IN 0.9 % SODIUM CHLORIDE IV
40 mg/250 mL (160 mcg/mL) | Status: CP
Start: 2020-10-15 — End: ?
  Administered 2020-10-15: 05:00:00 40 mL/h via INTRAVENOUS

## 2020-10-15 MED ORDER — ALBUTEROL SULFATE 2.5 MG/3 ML (0.083 %) SOLUTION FOR NEBULIZATION
2.5 mg /3 mL (0.083 %) | RESPIRATORY_TRACT | Status: DC
Start: 2020-10-15 — End: 2020-10-19
  Administered 2020-10-15 – 2020-10-19 (×26): 2.5 mL via RESPIRATORY_TRACT

## 2020-10-15 MED ORDER — OXYCODONE IMMEDIATE RELEASE 5 MG TABLET
5 mg | JEJUNOSTOMY | Status: DC | PRN
Start: 2020-10-15 — End: 2020-10-20

## 2020-10-15 MED ORDER — FAMOTIDINE 20 MG TABLET
20 mg | Freq: Every day | GASTROSTOMY | Status: DC
Start: 2020-10-15 — End: 2020-10-19
  Administered 2020-10-16 – 2020-10-19 (×4): 20 mg via GASTROSTOMY

## 2020-10-15 MED ORDER — INSULIN U-100 REGULAR HUMAN 100 UNIT/ML (SLIDING SCALE)
100 unit/mL | Freq: Four times a day (QID) | SUBCUTANEOUS | Status: DC
Start: 2020-10-15 — End: 2020-10-17
  Administered 2020-10-15 – 2020-10-17 (×7): 100 mL via SUBCUTANEOUS

## 2020-10-15 MED ORDER — VANCOMYCIN THERAPY PLACEHOLDER
INTRAVENOUS | Status: DC
Start: 2020-10-15 — End: 2020-10-19

## 2020-10-15 MED ORDER — INSULIN U-100 REGULAR HUMAN 100 UNIT/ML INJECTION SOLUTION
100 unit/mL | Freq: Four times a day (QID) | SUBCUTANEOUS | Status: DC
Start: 2020-10-15 — End: 2020-10-16
  Administered 2020-10-15 – 2020-10-16 (×3): 100 mL via SUBCUTANEOUS

## 2020-10-15 MED ORDER — PHENYLEPHRINE 40 MG/250 ML (160 MCG/ML) IN 0.9 % SODIUM CHLORIDE IV
40250160 mg/250 mL (160 mcg/mL) | INTRAVENOUS | Status: DC
Start: 2020-10-15 — End: 2020-10-17
  Administered 2020-10-15 – 2020-10-17 (×6): 40 mL/h via INTRAVENOUS

## 2020-10-15 MED ORDER — INSULIN GLARGINE 100 UNIT/ML SUBCUTANEOUS SOLUTION
100 unit/mL | Freq: Every day | SUBCUTANEOUS | Status: DC
Start: 2020-10-15 — End: 2020-10-16
  Administered 2020-10-15: 22:00:00 100 mL via SUBCUTANEOUS

## 2020-10-15 MED ORDER — IOHEXOL (OMNIPAQUE) 350 MG/ML ORAL SOLUTION 25 ML IN WATER 900 ML
Freq: Once | ORAL | Status: CP
Start: 2020-10-15 — End: ?
  Administered 2020-10-15: 15:00:00 900.000 mL via ORAL

## 2020-10-15 MED ORDER — IOHEXOL 350 MG IODINE/ML INTRAVENOUS SOLUTION
350 mg iodine/mL | Freq: Once | INTRAVENOUS | Status: CP | PRN
Start: 2020-10-15 — End: ?
  Administered 2020-10-15: 18:00:00 350 mL via INTRAVENOUS

## 2020-10-15 NOTE — Other
-    CONSULT  REQUEST  DOCUMENTATION  -  [facility] NOTE  -  Type of consult: [facility] Palliative Care   -  New Consult: [id number] [name] /Location: 7112/7112-A / Brief Clinical Question: 54yo w/ laryngeal SCC s/p tracheostomy, planning for palliative chemo/radiation; would like to engage palliative care team for symptom management recs/family support/Callback Cell Phone: [phone] / Please confirm receipt of this message by texting back ?OK?  -  1 - Mobile Heartbeat message sent to Cronin[place]Ozy[place], Coy Saunas at 10:23 AM. Received response at 1024.  [name]  10/15/2020  10:23 AM  Consult Connect [facility] [phone]

## 2020-10-15 NOTE — Telephone Encounter
Judeth Cornfield a PA from Surgical  ICU @ [facility] called to let you know the pt's disease has progressed and the plan no is to involve palative care. Any questions she can be reached @ [phone]

## 2020-10-15 NOTE — Transfer Summaries
Oncology Transfer Note:Brief Hospital Course:Arthur [name] is a 55 y.o. male w/hx of HTN, HLD, DM, tobacco and EtOH use, chronic ITP, Waldenstrom macroglobulinemia, T3NxMx L laryngeal SCC currently on CCRT, trach/PEG transferred from [place]?on 2/4?due to SOB w/exam significant for tumor invading the trach stoma and scope showing partial obstruction. On 2/5, his trach was changed to a [place], which bypasses the obstruction.  [name] of the neck was performed on 02/05 which showed a laryngeal mass with stromal invasion and involvement of the thyroid gland, to trachea, hypopharynx, and proximal cervical esophagus with left level 3 and level 4 lymphadenopathy. [place] chest showed mediastinal adenopathy and circumferential thickening of trachea concerning for metastatic disease. He had bilateral dependant consolidations likely representing aspiration PNA. Pulmonary nodules were also present measuring up to 6mm.  On 2/6, TAART was called 2/2 mucous plugging, which was cleared by tracheostomal lavage/suction at bedside. On 2/7, a bedside biopsy was performed for tumor profiling and path.  He was transferred to the oncology floor for further management of his malignancy.  He subsequently had a TAART codes called on 2020-10-28 and today for tachypnea due to obstruction distal to the trach site.  During both instances, patient improved with suctioning.  During today's rapid response, ENT performed a bedside laryngoscopy which again suggests that patient is likely mucus plugging.  Interventional pulmonology was consulted and recommended secretion management.  They did not believe that patient would benefit from any IP intervention at this time.  Patient was placed on ACPC ventilation.  He was subsequently evaluated by both MICU in SICU and was ultimately transferred to the SICU.  Terms of his tumor management, his disease progressed and is no longer considered curable.  He is planned to start palliative chemo radiation on 10/20/2020.  In terms of medical management, he is currently on ceftriaxone and vancomycin for possible superimposed infection.  He has type 2 diabetes managed by the diabetes team.For further details regarding medical management, please refer to today's oncology progress note.

## 2020-10-15 NOTE — Plan of Care
[facility]		Location: 27 SICU/7112-A54 y.o., male			Attending: Alric Quan, MD	Admit Date: 10/09/2020		[id number] LOS: 6 days FOLLOW UP NUTRITION ASSESSMENT DIET ORDER: Bolus 1 carton @ 10 am, 2 cartons @ 2 pm, 2 cartons @ 6 pm, 1 ProSource BID, cycle @ 90 mL/hr x 12 hrs (8 pm-8 am) ANTHROPOMETRICS:Height: 74Admit wt: 136.2 kgDosing wt: 95 kgBMI: 38.6 k/mg^2Additional wt information:Ht confirmed with pt: yes. Based on EMR wt hx, pt down 8.4 kg (5.8%) over the past 3 months, not considered significant for time frame. Fluctuations in wt may be r/t fluid status vs scale variance. No edema documented in flowsheets. Furosemide on hold. Wt hx with the lowest wt from each available month over the past year: 09/22/20 (!) 137.8 kg 08/05/20 (!) 143.2 kg ESTIMATED NUTRITION REQUIREMENTS:Kcal/day: 2850	(30 kcal/kg)Protein/day: 162	(1.7 gm/kg) Fluids/day: Fluid management per medical team discretion. ~2580 mL (30 mL/kcal) Needs based on: dosing wt (95 kg), CA w/mets, AKI/CKD, pressor, ?PNANUTRITION ASSESSMENT: Chart reviewed for consult re: hyperglycemia. Pt seen trached to vent and sedated (propofol @ 36.9 mL/hr which provides 974 kcal/d). Pt received nocturnal continuous feeds and bolus feeds during the day. Hyperglycemia present. DM team on board. Consider switching to continuous feeds throughout the day due to acute events. New recommendations outlined below. D/w Judeth Cornfield PA, permission given to this writer to adjust orders per protocol. DM updated with changes in carb amount. Will continue to follow. Cultural/Religious/Ethnic Needs: NoFood Allergies: None per pt NUTRITION DIAGNOSIS:Inadequate oral intake related to medical status as evidenced by NPO. --- continues INTERVENTIONS/RECOMMENDATIONS: Meals and Snacks:- Diet advancement per team/SLPGoal: Patient will have diet advanced or initiate nutrition support prior to next nutrition assessment. --- met, continuesEnteral Nutrition:Continuous regimen on propofol @ 36.9 mL/hr:- Diabetisource @ 50 mL/hr x 24 hrs - Bolus 2 ProSource TID- Total provides: 1800 kcal (2774 kcal w/current propofol rate), 162 gm protein, 120 gm CHO (5 gm/hr), and 979 mL free H2O- Additional free water boluses per team Continuous regimen off propofol: - Diabetisource @ 95 mL/hr x 24 hrs - Bolus 1 ProSource BID- Total provides: 2856 kcal, 167 gm protein, 228 gm CHO (9.5 gm/hr), and 1860 mL free H2O- Additional free water boluses per team Combined continuous/bolus regimen: - Diabetisource @ 90 mL/hr x 12 hrs overnight - Bolus during the day --> consider 1 carton at 10am, 2 cartons at 2pm, and 2 cartons at 6pm (5 cartons total)  - Bolus 1 Prosource BID- Total provides: 2870 kcal, 170 gm protein, 233 gm CHO (25 gm per bolus and 9 gm/hr overnight) and 1906 mL free water - additional free water per teamBolus regimen: - Bolus 2 cartons 4x per day and 1 carton daily (9 cartons total) - Bolus 1 ProSource BID- Provides: 2820 kcal, 165 gm protein, 225 gm CHO (25 gm per bolus), and 1841 mL free water - Additional free water per team.Goal: Patient will advance/tolerate goal rate TF prior to next nutrition assessment. --- met, goal d/cNew Goal: Patient will receive >90% of goal volume TF prior to next nutrition assessment Discharge Planning and Transfer of Nutrition Care:  Nutrition related discharge needs still being determined at this time, will continue to follow.MONITORING / EVALUATION:Food/Nutrition-Related History:Food and Nutrient IntakeEnteral Nutrition IntakeAnthropometric Measurements:Weight Biochemical Data, Medical Tests and Procedures:Electrolyte and renal profileGlucose/endocrine profileNutrition-Focus Physical FindingsOverall findingsElectronically signed by Suzie Portela, MS, RD, CNSC, CDN on February 10, 2022[facility]: [phone] note: Nutrition is a consult only service on weekends and holidays.  Please enter a consult in EPIC if assistance is needed on a weekend or holiday or via [facility] [phone]  to contact the covering RD.

## 2020-10-15 NOTE — H&P
[place]	SICU History & PhysicalHistory provided by: EMR reviewHistory limited by: patient intubated Subjective: Chief Complaint: Tachypnea, increased work of breathing HPI: 55 y.o. male admitted to the SICU on 2/9 for Ventilator management following an RRT on the oncology floor for desaturation and increased work of breathing while on trach mask and ultimately placed back on vent. Patient was initially a transfer from [facility] on 2/4 after trach and PEG when he had a partial obstruction of trach site from a new necrotic mass. He underwent trach exchange 2/5. Was on trach mask on oncology floor however had 2 episodes on floor requiring RRT for lavage and likely mucous plugging. Course Complicated by: - Incidental L IJ thrombus - multiple episodes of presumed mucous plugging requiring RRT's PMH: Chronic ITP, Waldenstrom macroglobulinemia, HTN. HLD, DM, tobacco and alcohol use, Laryngeal SCC (weekly Cisplatin and RT)PSH: None known Home meds: Lipitor, Pepcid, Lisinopril, Metformin, Oxycodone, miralax, Insulin U100 and Lantus SICU course:2/9: Arrives to SICU HDS, on vent PCV. Appears well. Transitioned to Pressure support on vent and tolerating well. Medical History: PMH PSH Past Medical History: Diagnosis Date ? Diabetes mellitus (HC Code) (HC CODE)  ? Hypercholesteremia  ? Hypertension   No past surgical history on file. Social History Family History Social History Tobacco Use ? Smoking status: Former Smoker   Packs/day: 1.00   Years: 30.00   Pack years: 30.00   Quit date: 03/04/2013   Years since quitting: 7.6 Substance Use Topics ? Alcohol use: Yes   Alcohol/week: 14.0 standard drinks   Types: 14 Cans of beer per week  Family History Problem Relation Age of Onset ? Aneurysm Mother   Prior to Admission Medications Medications Prior to Admission Medication Sig Dispense Refill Last Dose ? atorvastatin (LIPITOR) 20 mg tablet TAKE 1 TABLET BY MOUTH ONCE DAILY    ? famotidine (PEPCID) 20 mg tablet Take 20 mg by mouth 2 (two) times daily.    ? furosemide (LASIX) 20 mg tablet 1 tablet (20 mg total) by Per G Tube route daily. 30 tablet 1  ? insulin aspart U-100 (NOVOLOG) 100 unit/mL vial Inject under the skin 3 (three) times daily before meals. 8 units before breakfast and lunch, 14 units before dinner    ? insulin glargine,hum.rec.anlog (TOUJEO SOLOSTAR U-300 INSULIN SUBQ) Inject 62 Units under the skin daily.     ? lisinopriL (PRINIVIL,ZESTRIL) 10 mg tablet TAKE 1 TABLET BY MOUTH ONCE DAILY    ? LORazepam (ATIVAN) 0.5 mg tablet Take 0.5 mg by mouth every 6 (six) hours as needed for anxiety.   Past Month at Unknown time ? metFORMIN (GLUCOPHAGE) 1000 mg tablet Take 1,000 mg by mouth daily.     ? albuterol (PROVENTIL, VENTOLIN) 2.5 mg /3 mL (0.083 %) nebulizer solution Take 3 mLs by nebulization daily.    ? azelastine (ASTELIN) 137 mcg (0.1 %) nasal spray Use 2 sprays in each nostril every 8 (eight) hours as needed for rhinitis. Use in each nostril as directed (Patient not taking: Reported on 10/11/2020)   Not Taking at Unknown time ? magnesium oxide (MAGOX) 400 mg (241.3 mg magnesium) tablet 1 tablet (400 mg total) by Per G Tube route daily. 200 tablet 1 More than a month at Unknown time ? oxyCODONE (ROXICODONE) 5 mg Immediate Release tablet Take 5 mg by mouth every 4 (four) hours as needed.   Unknown at Unknown time ? polyethylene glycol (MIRALAX) 17 gram packet Take 17 g by mouth daily. Mix in 8 ounces of water,  juice, soda, coffee or tea prior to taking. (Patient not taking: Reported on 10/11/2020)   Not Taking at Unknown time ? traZODone (DESYREL) 50 mg tablet Take 25 mg by mouth nightly. (Patient not taking: Reported on 10/11/2020)   Not Taking at Unknown time  Allergies No Known Allergies Review of Systems: Review of Systems Unable to perform ROS: Intubated  Objective: Vitals:I have reviewed the patient's current vital signs as documented in the EMR.  , Last 24 hours: Temp:  [97.6 ?F (36.4 ?C)-100.04 ?F (37.8 ?C)] 100.04 ?F (37.8 ?C)Pulse:  [89-136] 106Resp:  [17-26] 18BP: (132-176)/(86-149) 147/94SpO2:  [97 %-100 %] 100 %, and O2 Sat and Device used: SpO2: 100 %Device (Oxygen Therapy): mechanical ventilatorI/O's:I have reviewed the patient's current I&O's as documented in the EMR. and Gross Totals (Last 24 hours) at 10/14/2020 2212Last data filed at 10/14/2020 2100Intake 2255.79 ml Output 2250 ml Net 5.79 ml Physical Exam: Physical ExamConstitutional:     General: He is not in acute distress.   Appearance: He is not ill-appearing or toxic-appearing. HENT:    Head: Normocephalic and atraumatic.    Right Ear: External ear normal.    Left Ear: External ear normal.    Nose: No congestion or rhinorrhea.    Mouth/Throat:    Mouth: Mucous membranes are moist. Eyes:    Extraocular Movements: Extraocular movements intact.    Pupils: Pupils are equal, round, and reactive to light. Neck:    Comments: Tracheostomy in place Cardiovascular:    Rate and Rhythm: Regular rhythm. Tachycardia present.    Heart sounds: No murmur heard. Pulmonary:    Breath sounds: No wheezing, rhonchi or rales.    Comments: Slight tachypnea Abdominal:    General: There is no distension.    Tenderness: There is no abdominal tenderness.    Comments: PEG tube in place  Genitourinary:   Comments: Foley in place Musculoskeletal:    Cervical back: Normal range of motion.    Right lower leg: No edema.    Left lower leg: No edema. Skin:   General: Skin is warm and dry.    Capillary Refill: Capillary refill takes less than 2 seconds. Neurological:    General: No focal deficit present.    Mental Status: He is alert.    Cranial Nerves: No cranial nerve deficit.    Motor: No weakness. Labs: Refer to EMR. I have reviewed the patient's labs within the last 24 hours; see assessment and plan below for significant abnormals.Microbiology:Refer to EMR. I have reviewed all new results within the last 24 hours; see assessment and plan below.Diagnostics:Refer to EMR. I have reviewed all new results within the last 24 hours; see assessment and plan below.Chest ZO:XWRUE to EMR. I have reviewed all new results within the last 24 hours; see assessment and plan below.Abdominal AV:WUJWJ to EMR. I have reviewed all new results within the last 24 hours; see assessment and plan below.ECG/Tele Events: I have reviewed the patient's ECG and telemetry as resulted in the EMR.Assessment/Plan Neurologic: Analgesia/ Anxiolysis - Tylenol 650 Q6 PRN - Oxy 5/10 SS PRN - Ativan 0.5mg  Q6 prn for anxiety- Trazodone 25mg  Qhs PRN ENT: L Laryngeal SCC, invading tracheostomy stoma: s/p trach exchange on 10/10/20 and biopsy on 10/12/20- [place] of the neck was performed on 02/05 which showed a laryngeal mass with stromal invasion and involvement of the thyroid gland, to trachea, hypopharynx, and proximal cervical esophagus with left level 3 and level 4 lymphadenopathy- Bivona trach to bypass obstruction - Radiation oncology consulted, recs appreciated-  ENT reccs appreciated - F/u path from 2/7- Pratt abdomen pelvis pending for staging (non emergent) - Radiation onc following and plan for initial palliative radiation 2/15 with eventual bridge to chemo - Onc following appreciate reccs - Will have tumor board evaluation Cardiac: Hx of HTN/ HLD- Maintain continuous telemetry- holding home lisinopril- holding home Lasix- Continue home crestorRespiratory:  Respiratory failure in the setting of mucous plugging and tumor invasion of tracheal stoma- High secretion burden and frequent mucous plugging on floor - Came to unit on PCV- Tolerating pressure support as patient awake and spontaneously breathing - Vap precautions - Maintain O2 sats >92%, wean as tolerated- Encourage IS, deep breathing, coughing- Tracheal suctioning and lavaging as needed - Albuterol neb Q4WA- Atropine 2mg  SL Q4 for secretions - Hypertonic saline nebs Every 8 hours - Interventional pulm consult pending for consideration of tracheal stent GI/Nutrition: - NPO- PEG tube - Nutrition following with below recommendations - Diabetisource 1 carton @ 10 am, 2 cartons @ 2 pm, 2 cartons @ 6 pm, 1 ProSource BID, cycle Diabetisource @ 90 mL/hr x 12 hrs (8 pm- 8 am) - GOALPUD prophylaxis- Pepcid Renal/Fluids/Lytes: CKD: Baseline Cr (1.5-1.6)- Renal U/S with no hydronephrosis- Foley catheter for strict I&O- MIVF while NPO- Mg/K sliding scalesInfectious Disease: Possibly PNA, concern for infected necrotic mass? - S/P Zosyn - Ceftriaxone - Vanco given positive MRSA swab and concern for PNA - Perioperative antibioticsHematology:Acute blood loss anemia- In the setting of surgery - Consider transfusion for H&H<7/21 or hemodynamic instabilityHx of Chronic ITP, Waldenstrom macroglobulinemia, thrombocytopenia - Transfuse platelets <50 if bleeding, <10 if no active bleeding- Hematology following appreciate recommendations - trend daily hemolysis retic panel, LDH, haptoglobin- trend daily DIC markers: PT/PTT/INR, fibrinogenL IJ Thrombus: incidentally found on [place]- BUE dopplers with no DVT- S/P high risk heparin gtt Discontinued 2/8 given thrombocytopenia - Hematology following DVT prophylaxis- Maintain SCDs at all times- No chemoprophylaxis at this time Endocrine: Hx DM II on oral agents and insulin - Insulin Per Diabetes team 1.	Increase  NPH to 28UNITS Q 8PM AT START OF NOCTURNAL TUBE FEEDS -- do not give if tube feeds are held 2.	Increase LISPRO to 8 UNITS WITH 10AM BOLUS 1 CAN - hold if bolus TF not given 3.	Increase LISPRO to 14 UNITS WITH 2 PM BOLUS 2 CANS -- hold if bolus TF not given 4.	Increase LISPRO to 14 UNITS WITH 6 PM BOLUS 2 CANS -- hold if bolus TF not given 5.	Continue  Lispro mid dose sliding scale timed at 10A, 2P, 6P6.	Change lispro mid dose sliding scale q MIDNIGHT to start with BG 150. 7.	DISCONTINUE Regular insulin mid dose sliding scale q 6 hours 8.	Continue blood glucose monitoring 12AM,10 AM, 2PM, 6PMMusculoskeletal: Generalized deconditioning- OOB daily, PT/OT evals- Turn and reposition Q2 hours, PUP dressingDevice Review:10/14/2020 - Bivona trach- Foley - PIV's Code Status / Dispo:- Full codeNotifications: For questions please call: SICU Covering Provider [facility] / EpicPCP: Christella Scheuermann [phone] Care Provider was notified of this admission. Per primary team Family was notified of this admission. Per primary team Plan discussed with patient and/or family. YesI have reviewed the plan of care with the bedside nurse, including an emphasis on the most important aspects of care for the next 12 hours: yesSigned:Leroy Pettway [place], [place] 10/14/2020

## 2020-10-15 NOTE — Other
[facility]	 [place] HealthRapid Response Team (RRT) NotePrimary Team: Oncology Primary Attending: Attending Provider: Lendon Collar, MD [phone] Physician contacted: yes----------------------------------------------------------------------------Reason for RRT activation: TachypneaClinical History and Assessment/Plan: 53M with SCC carcinoma of the larynx s/p tracheostomy admitted with progression of malignancy resulting in tracheal partial obstruction treated on 2/5 with tracheostomy exchange bypassing the obstruction. Course has been complicated by episodes of respiratory distress in the setting of mucous plugging and increased secretions with TAART called on 2/6 and 2/8. Patient became tachypneic this evening that was not responsive to suctioning by the primary care team. ENT was consulted and performed bedside laryngoscopy with lavage and suctioning with some improvement of symptoms and he was placed on PC ventilation. MICU team present for evaluation.  Plan to transfer to the MICU vs SICU pending bed availability. Pertinent Diagnos(es): Critical Care Dx(s): Acute respiratory failure, unspecified whether with hypoxia or hypercapniaPatient identified as Septic?  NoConcern for Septic Shock? NoPatient Disposition: Escalate care to ICUFamily Contacted: yes------------------------------------------------------------------------------The clinical varibles that should immediately trigger a call back to the RRT physician are the following: O2 sat < 92%, tachypnea, altered mental status, tachycardia [facility] RRT Physician can be reached directly via the Mobile HeartBeat dynamic role.Electronically Signed:Chelesea Weiand Victorino Dike, MD2/9/20227:09 PM

## 2020-10-15 NOTE — Progress Notes
Otolaryngology Progress Note:Arthur [name] is a 56 y.o. 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, trach/PEG transferred from [facility] due to SOB w/exam significant for tumor invading the trach stoma and scope showing partial obstruction.Interim History:- Transferred to SICU from med onc yesterday for respiratory concerns- Planned to start palliative chemo radiation on 10/20/2020- Febrile to 101.32F- On ventilator, VAC 16/450/50%/5- On fentanyl, prop, phenylephrineVitals:Temp:  [97.7 ?F (36.5 ?C)-101.3 ?F (38.5 ?C)] 99.14 ?F (37.3 ?C)Pulse:  [75-147] 75Resp:  [0-27] 14BP: (71-176)/(48-149) 97/52SpO2:  [97 %-100 %] 100 %Device (Oxygen Therapy): mechanical ventilatorO2 Flow (L/min):  [8-15] 15I/O last 3 completed shifts:In: 3545.6 [I.V.:1092.4; NG/GT:1507; IV Piggyback:946.2]Out: 1535 [Urine:1535]Physical Exam:Gen: Sedated, on ventilator, NADEyes: closed, open intermittentlyFace: Facial movements grossly symmetricNose: clear anteriorlyOP/OC: hemostaticNeck: Wide open tracheal stoma w/protrusion of the Bivona 8 cuffed secured w/soft trach ties, necrotic but hemostatic mass surrounding the stoma covered with dressing, remaining neck flat and softPulm: on ventilationProcedure: flexible fiberoptic tracheoscopy with trach in good position. Small amount of non-obstructive soft tissue at edge of trach opening on right side. Airway fully patent with carina well visualized. No mucous plugs. Thin secretions suctioned with 10 Fr suction.Recent Labs Lab 02/09/220558 02/09/220558 02/10/220630 WBC 7.4   < > 8.3 HGB 7.7*   < > 7.6* HCT 24.10*   < > 24.00* MCV 87.3   < > 88.2 PLT 37*  --  40*  < > = values in this interval not displayed. Recent Labs Lab 02/04/222346 02/05/220511 02/10/220055 02/10/220630 02/10/220917 NA 134*   < >  --  136  --  K 5.5*   < >  --  4.7  --  CL 97*   < >  --  98  --  CO2 25   < >  --  28  --  BUN 36*   < >  --  34*  --  CREATININE 1.65*   < >  --  1.96*  --  GLU 231*   < >   < > 290* 282* CALCIUM 9.7   < >  --  8.8  --  MG 2.0   < >  --  1.9  --  PHOS 4.2  --   --  3.9  --   < > = values in this interval not displayed. Recent Labs Lab 02/08/220424 02/08/220644 02/10/220630 PTT 41.0*   < > 23.1 INR 1.07  --  1.02  < > = values in this interval not displayed. Recent Labs Lab 02/07/220140 02/10/220630 ALT  --  17 AST  --  17 BILITOT  --  0.2 ALKPHOS  --  72 LIPASE  --  6* ALBUMIN 2.6* 2.8* Invalid input(s): GLUMETAssessment and Plan: Arthur [name] is a 55 y.o. 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- will get [place] neck for trach positioning- may need custom trach, but trach non-obstructed by tumor- will discuss at tumor board - continue routine trach care with careful suctioning- ensure trach well secured with ties- wean off sedation and ventilation as possible- care per med onc, radiation oncology, Idelle Leech, MD (PGY-1)Otolaryngology- Head and Neck SurgeryY[facility]             ------------Admitted Inpatients: Contact me by [facility] Inpatient Consults: [phone] ED Consults: [phone]

## 2020-10-15 NOTE — Progress Notes
[facility]	SICU Progress NoteAttending Provider: Alric Quan, MDPost-Operative Day/Post Injury Day Procedure(s):2/5: [name] exchange (ENT) Hospital LOS: 6 days Interim History:  HPI: 55 y.o. male admitted to the SICU on 2/9 for ventilator management following an RRT on the oncology floor for desaturation and increased work of breathing while on trach mask and ultimately placed back on vent. ?Patient was initially a transfer from [facility] on 2/4 after trach and PEG when he had a partial obstruction of trach site from a new necrotic mass. He underwent trach exchange 2/5. Was on trach mask on oncology floor however had 2 episodes on floor requiring RRT for lavage and likely mucous plugging. ?Course Complicated by: - Incidental L IJ thrombus - multiple episodes of presumed mucous plugging requiring RRT's ?PMH: Chronic ITP, Waldenstrom macroglobulinemia, HTN. HLD, DM, tobacco and alcohol use, Laryngeal SCC (weekly Cisplatin and RT)?PSH: None known ?Home meds: Lipitor, Pepcid, Lisinopril, Metformin, Oxycodone, miralax, Insulin U100 and Lantus ?SICU course:2/9: Arrives to SICU HDS, on vent PCV. Appears well. Transitioned to Pressure support on vent and tolerating well. O/N: Episode of patient feeling like not getting a full breath, extremely tachypenic and increased work of breathing and tachycardia, attempted suctioning and lavage without improvement, attempted to switch to PCV and VAC but due to dyssynchrony unable to tolerate, no desat but poor ventilation and TV and ETCO2 up to 100. ENT at bedside do not feel additional scope would benefit as he was just scoped a few hours ago, ultimately gave fentanyl push and started propofol for vent synchrony but despite max prop still dyssynchronous. Gave one dose of roccuronium once sedated for vent synchrony with good effect. Albuterol and HTS neb given and peaks improved from 50 to 30's. No further paralytic needed and now synchronous on VAC. Kept on propofol for vent synchrony. CXR reassuring and back to GCS 11T. Hypotension only following sedation and Rocc, Low dose Neo started. 2/10: Kept on higher sedation in AM for [place] A/P (for staging) and soft tissue neck. Weaning sedation and will plan for PSV trial once more awake. Scoped by ENT in AM, airway patent. Q1-2hr suction requirements of thick tan secretions. No further episodes of respiratory distress although sedation remains high. Tube feeds changed to continuous given significant hyperglycemia. Insulin regimen changed per diabetes team recommendations. Pressure ulceration noted around tracheostomy --> ENT at bedside to evaluation, likely some component of invasive tumor growth. Palliative care consult for assist with symptom management and family moving forward. Primary oncologist office made aware of referral.  Review of Allergies/Meds/Hx: I have reviewed the patient's: allergies, past medical history, past surgical history, family history, social history, prior to admission medicationsObjective: Vitals:I have reviewed the patient's current vital signs as documented in the EMRLast 24 hours: Temp:  [97.6 ?F (36.4 ?C)-101.3 ?F (38.5 ?C)] 99.32 ?F (37.4 ?C)Pulse:  [83-147] 83Resp:  [0-27] 16BP: (71-176)/(48-149) 126/61SpO2:  [97 %-100 %] 100 %O2 Sat and Device used: SpO2: 100 %Device (Oxygen Therapy): mechanical ventilatorI/O's:I have reviewed the patient's current I&O's as documented in the EMR.Gross Totals (Last 24 hours) at 10/15/2020 0603Last data filed at 10/15/2020 0400Intake 3883.14 ml Output 1765 ml Net 2118.14 ml Physical ExamConstitutional:     General: He is not in acute distress.   Appearance: He is not ill-appearing or toxic-appearing. HENT:    Head: Normocephalic and atraumatic.    Right Ear: External ear normal.    Left Ear: External ear normal.    Nose: No congestion or rhinorrhea.    Mouth/Throat:  Mouth: Mucous membranes are moist. Eyes:    Extraocular Movements: Extraocular movements intact.    Pupils: Pupils are equal, round, and reactive to light. Neck:    Comments: Tracheostomy in place Cardiovascular:    Rate and Rhythm: Regular rhythm. Tachycardia present.    Heart sounds: No murmur heard. Pulmonary:    Breath sounds: No wheezing, rhonchi or rales.    Comments: Slight tachypnea Abdominal:    General: There is no distension.    Tenderness: There is no abdominal tenderness.    Comments: PEG tube in place  Genitourinary:   Comments: Foley in place Musculoskeletal:    Cervical back: Normal range of motion.    Right lower leg: No edema.    Left lower leg: No edema. Skin:   General: Skin is warm and dry.    Capillary Refill: Capillary refill takes less than 2 seconds. Neurological:    General: No focal deficit present.    Mental Status: He is alert.    Cranial Nerves: No cranial nerve deficit.    Motor: No weakness. Labs: Refer to EMR. I have reviewed the patient's labs within the last 24 hours; see assessment and plan below for significant abnormals.Microbiology:Refer to EMR. I have reviewed all new results within the last 24 hours; see assessment and plan below.Diagnostics:Refer to EMR. I have reviewed all new results within the last 24 hours; see assessment and plan below.Chest ZO:XWRUE to EMR. I have reviewed all new results within the last 24 hours; see assessment and plan below.Abdominal AV:WUJWJ to EMR. I have reviewed all new results within the last 24 hours; see assessment and plan below.ECG/Tele Events: I have reviewed the patient's ECG and telemetry as resulted in the EMR.Assessment/Plan Neurologic: Analgesia/ Anxiolysis - Prop drip for vent synchrony --> wean to off when able- Fentanyl gtt --> wean as able- Precedex gtt- Tylenol 650 Q6 PRN - Oxy 5/10 SS PRN - Ativan 0.5mg  Q6 prn for anxiety- Trazodone 25mg  Qhs PRN ?ENT: L Laryngeal SCC, invading tracheostomy stoma: s/p trach exchange on 10/10/20 and biopsy on 10/12/20- Fonda of the neck was performed on 02/05 which showed a laryngeal mass with stromal invasion and involvement of the thyroid gland, to trachea, hypopharynx, and proximal cervical esophagus with left level 3 and level 4 lymphadenopathy- Bivona trach to bypass obstruction - 2/10 [place] soft tissue neck - similar necrotic mass to prior imaging, unchanged RIJ thrombus- F/u path from 2/7- 2/10 [place] abdomen pelvis for staging --> increased abdominal lymphadenopathy - Radiation onc following and plan for initial palliative radiation 2/15 with eventual bridge to chemo - Onc following appreciate reccs - Will have tumor board evaluation on Monday- Palliative care consult placed?Cardiac: Hypotension, directly related to starting sedation - Neo gtt wean as able Hx of HTN/ HLD- Maintain continuous telemetry- holding home lisinopril- holding home Lasix- Continue home crestor?Respiratory:  Respiratory failure in the setting of mucous plugging and tumor invasion of tracheal stoma- High secretion burden and frequent mucous plugging on floor - Came to unit on PCV, initially able to tolerate PSV but became acutely distressed and transitioned to [facility]- Wean sedation and retrial PSV when more awake- Q2h suction requirements of thick tan secretins- Vap precautions - Maintain O2 sats >92%, wean as tolerated- Encourage IS, deep breathing, coughing- Tracheal suctioning and lavaging as needed - Albuterol neb Q4WA- Atropine 2mg  SL Q4 for secretions - Hypertonic saline nebs Every 8 hours - Aggressive pulmonary hygiene - Interventional pulm consult pending for consideration of tracheal stent ?GI/Nutrition: - NPO- PEG  tube - Nutrition following- Transitioned to continuous rate TF today given labile hyperglycemia- Diabetisource @ 50cc while on propofol?PUD prophylaxis- Pepcid ?Renal/Fluids/Lytes: CKD: Baseline Cr (1.5-1.6)- Renal U/S with no hydronephrosis- Foley catheter for strict I&O- MIVF while NPO- Mg/K sliding scales?Infectious Disease: Possibly PNA, concern for infected necrotic mass? - Ceftriaxone - Vanco given positive MRSA swab and concern for PNA - S/P Zosyn - Perioperative antibiotics?Hematology:Acute blood loss anemia- In the setting of surgery - Consider transfusion for H&H<7/21 or hemodynamic instability?Hx of Chronic ITP, Waldenstrom macroglobulinemia-Transfuse platelets <50 if bleeding, <10 if no active bleeding- Hematology following appreciate recommendations --> recommending to hold [facility] if platelets below 50, treat ITP as appropriate with dexamethasone/IVIG- Trend daily hemolysis retic panel, LDH, haptoglobin- trend daily DIC markers: PT/PTT/INR, fibrinogen?R IJ Thrombus: incidentally found on [place]- BUE dopplers with no DVT- S/P high risk heparin gtt discontinued 2/8 given thrombocytopenia - Hematology following, holding on [facility] given thrombocytopenia ?DVT prophylaxis- Maintain SCDs at all times- No chemoprophylaxis at this time ?Endocrine: Hx DM II on oral agents and insulin - Insulin Per Diabetes team - Lantus 10u QD @ 6pm- LDISS- Standing regular insulin 5u q6?Musculoskeletal: Generalized deconditioning- OOB daily, PT/OT evals- Turn and reposition Q2 hours, PUP dressing?Device Review:10/14/2020 - Bivona trach- Foley - PIV's ?Code Status / Dispo:- Full codeNotifications : For questions please call: SICU Covering Provider listed in [facility] / EpicElectronically Signed:Zayd Bonet PA-C2/06/2021

## 2020-10-15 NOTE — Progress Notes
Inpatient Diabetes Management Team Follow-up Note    Over the past 24 hours, the patient's glucose control has been variable but mostly above goal    Current Nutritional Status:  Bolus Tube feeding with Diabetisource as below  2/9: 1 carton @ 10 am, 2 cartons @ 2 pm, 2 cartons @ 6 pm, 1 ProSource BID, cycle Diabetisource @ 90 mL/hr x 12 hrs (8 pm- 8 am)  - Total provides: 2870 kcal, 170 gm protein, 233 gm CHO (25 gm per bolus and 9 gm/hr overnight) and 1906 mL free water    Changed to continuous tube feeding with Diabetisource at 50 mL/hr at noon today  - Total provides: 1800 kcal (2774 kcal w/current propofol rate), 162 gm protein, 120 gm CHO (5 gm/hr), and 979 mL free H2O    ? Current Anti-hyperglycemic Regimen:  NPH 28 units qHS, lispro 8 units @ 10am, lispro 14 units @ 2pm, lispro 14 units @ 6pm, lispro mid dose sliding scale ac tid, lispro mid dose sliding scale q hs     ? Home Anti-hyperglycemic Regimen: Toujeo U300 64 units daily, Apidra 20 units TID with meals, metformin 1000 mg bid     ? Other Contributing Medications:   ? Fentanyl infusion  ? Phenylephrine infusion  ? Propofol infusion     PE:    Patient laying in bed in NAD on the ventilator  Temp:  [97.6 ?F (36.4 ?C)-101.3 ?F (38.5 ?C)] 99.32 ?F (37.4 ?C)  Pulse:  [77-147] 77  Resp:  [0-27] 0  BP: (71-176)/(48-149) 107/57  SpO2:  [97 %-100 %] 100 %  Device (Oxygen Therapy): mechanical ventilator  O2 Flow (L/min):  [8-15] 15     Wt Readings from Last 3 Encounters:   10/15/20 131.3 kg   10/06/20 (!) 139.5 kg   09/29/20 (!) 142.4 kg       Data Review:  BGs:      Creatinine (mg/dL)   Date Value   16/06/9603 1.96 (H)     Hemoglobin A1c (%)   Date Value   10/10/2020 9.0 (H)       Assessment:  55 yo h/o type 2 diabetes, HTN, Waldenstroms macroglobulinemia, ETOH, laryngeal SCC s/p trach/PEG presented with SOB significant for tumor invading the trach stoma with partial obstruction and now plan to start palliative chemo radiation 2/15. Patient currently in the SICU for vent management. Diabetes team consulted for assistance in glycemic management.      BG over the past 24 hours have been above goal. He has been on nocturnal cycled TF as well as bolus TF. Plan to change to continuous TF. Given 50% decrease in carbohydrate amount with transition from cycled and bolus TF to continuous TF advise about a 50% reduction in insulin dosing, however given BG has been above goal would give slightly more insulin. Advise transition from NPH and lispro to glargine and regular which will best match effects of tube feedings. Discussed plan with the covering provider S. Madlener, PA. For this patient on continuous TF, the goal is to maintain all BGs between 140-180 mg/dl    Recommendations:   1. Discontinue NPH 28 units qHS  2. Discontinue LISPRO 8 UNITS WITH 10AM BOLUS 1 CAN - hold if bolus not given   3. Discontinue LISPRO 14 UNITS WITH 2 PM BOLUS 2 CANS -- hold if bolus not given   4. Discotninue LISPRO  14 UNITS WITH 6 PM BOLUS 2 CANS -- hold if bolus not given   5. Discontinue  Lispro mid dose sliding scale timed at 10A, 2P, 6P  6. Discontinue lispro mid dose sliding scale q MIDNIGHT to start with BG 150.   7. Start glargine 10 units daily at 6PM  8. Start regular insulin 5 units q6 hours. HOLD if TF is held  9. Start regular insulin mid dose sliding scale q 6 hours   10. Blood glucose monitoring q6 hours  11. Diet: tube feeds per nutrition       Please let us know if you have any questions or concerns about our recommenations. We will continue to follow the patient along with you during his hospitalzation as we attempt to optimize his glycemic control.  Detailed recommendations were communicated to the primary team both verbally and/or through this written note.  I spent a total of 25 minutes with the patient of which 15 minutes (over 50%) was spent in counseling the patient regarding diabetes management and coordinating care with the primary team.     Signed:  Everlene Farrier, APRN  Inpatient Diabetes Management Team   [facility]: [phone]  Diabetes consult pager # [phone]      NOTE: During nights, weekends, and holidays, please contact the on-call Endocrine Fellow at pager [phone].

## 2020-10-15 NOTE — Plan of Care
Plan of Care Overview/ Patient Status    Transfer Note Nursing Arthur [name] is a 55 y.o. male transferred from [facility] 12 for vent management, desat and increased work of breathing.Assisted in to bed.  Vitals:  10/15/20 0500 10/15/20 0554 10/15/20 0600 10/15/20 0700 BP: (!) 116/59  (!) 98/52 (!) 100/53 Pulse: 88 (!) 109 (!) 102 82 Resp: 16 (!) 21 17 (!) 2 Temp: 99.68 ?F (37.6 ?C) 99.5 ?F (37.5 ?C) 99.68 ?F (37.6 ?C) 99.32 ?F (37.4 ?C) TempSrc: Bladder  Bladder Bladder SpO2: 100% 100% 100% 100% Weight:   131.3 kg  Height:      I have reviewed the patient's current medication orders..Pt arrived from NP 12 ~2030. Alert and oriented x4 (communicating by typing on his phone). PERRL. MAE and following commands. Oxy and ativan PRN given for pain and anxiety with minimal effect. HR 80's-150's when agitated. Neo gtt started to maintain MAP >65. Right single lumen port intact. Right #20 extended dwell placed by SICU APP, PIV x2. AM labs drawn; awaiting for results. Tmax 101.1 per bladder probe. #8 Bivona intact. Trach care and foam trach ties changed. Pt remains on vent. CPAP --> Pressure control --> SIMV --> now volume AC on 16/450/5/30%. NPO. Diabetisource infusing throuh PEG at 90 mL/hr. NO BM. Temp sensing foley placed. 500cc LR bolus given for oliguria with good effect. Please see skin flowsheet for skin details.Pt had two episodes of agitation and anxiety. During the first one, pt was tachycardic up to the 150's. Desynchronized with the vent and not getting his volumes. SICU APP, RT and ENT called to bedside. 50 mcg of fentanyl given x2. Roc given x1. Dex started and then quickly switched to propofol and fentanyl gtt. Pt also became hypotensive, 500cc LR bolus given and then neo gtt started. Aggressive lavage completed.During the second episode around 0554, propofol maxed and fent gtt increased to 2. SICU APP and RT called to bedside. Aggresively lavaged again. Pt recovered well.See doc flowsheets, I&O, and MAR for more info. Electronically Signed by Whitney Post, RN, October 15, 2020

## 2020-10-15 NOTE — Other
[facility]     Interventional Pulmonology Initial Consultation    Attending Provider: Alric Quan, MD  Admit Date: 10/09/2020  Length of stay: 6 Day(s)    Subjective:     Reason for Consultation:  Tracheal stenting    HPI:   This is a 55 year old gentleman with a history of laryngeal cancer currently on treatment but with progression of disease that originally presented in the setting of dyspnea to an outside hospital with tumor invasion of his trach stoma and partial obstruction.  This was improved with a longer trach to bypass the obstruction however the patient continues to have copious secretions that occluded his trach and he needs positive-pressure or deep lavaging in order to improve.  IP consulted for comment on any other airway interventions that might prevent this from continuing to happen.    ROS unable to be obtained due to patient condition  Medications:     Current Facility-Administered Medications   Medication   ? acetaminophen (TYLENOL) suspension 650 mg   ? albuterol neb sol 2.5 mg/3 mL (0.083%) (PROVENTIL,VENTOLIN)   ? albuterol neb sol 2.5 mg/3 mL (0.083%) (PROVENTIL,VENTOLIN)   ? atropine 1 % SUBLINGUAL solution 2 mg   ? cefTRIAXone (ROCEPHIN) 1 g in sodium chloride 0.9 % 10 mL (100 mg/mL)   ? chlorhexidine gluconate (PERIDEX) 0.12 % solution 15 mL   ? dexmedetomidine (PRECEDEX) 400 mcg/100 mL (4 mcg/mL) in sodium chloride 0.9% infusion   ? dextrose (GLUCOSE) 40 % gel 15 g    Or   ? fruit juice 120 mL    Or   ? skim milk 240 mL   ? dextrose (GLUCOSE) 40 % gel 30 g    Or   ? fruit juice 240 mL   ? dextrose 50 % in water (D50W) syringe 12.5 g   ? dextrose 50 % in water (D50W) syringe 25 g   ? famotidine (PEPCID) tablet 20 mg   ? fentaNYL (SUBLIMAZE) 2500 mcg in 50 mL UNDILUTED (50 mcg/mL) infusion   ? fentaNYL (SUBLIMAZE) bolus from bag 50 mcg   ? [Held by provider] furosemide (LASIX) tablet 20 mg   ? glucagon 1 mg in water for injection, sterile 1 mL (1 mg/mL)   ? insulin lispro (Admelog, HumaLOG) 100 unit/mL injection 14 Units   ? insulin lispro (Admelog, HumaLOG) 100 unit/mL injection 14 Units   ? insulin lispro (Admelog, HumaLOG) 100 unit/mL injection 8 Units   ? insulin lispro (Admelog, HumaLOG) Sliding Scale (See admin instructions for dose)   ? insulin lispro (Admelog, HumaLOG) Sliding Scale (See admin instructions for dose)   ? insulin NPH human (HumuLIN N,NovoLIN) injection 28 Units   ? LORazepam (ATIVAN) 2 mg/mL injection   ? LORazepam (ATIVAN) tablet 0.5 mg   ? oxyCODONE (ROXICODONE) Immediate Release tablet 10 mg   ? oxyCODONE (ROXICODONE) Immediate Release tablet 5 mg   ? oxymetazoline (AFRIN) 0.05 % nasal spray 1 spray   ? phenylephrine (NEOSYNEPHRINE) 40 mg in 0.9% NaCl 250 mL (0.16 mg/mL) infusion   ? propofol (DIPRIVAN) 1,000 mg in 100 mL (10 mg/mL) infusion   ? Prosource no carb 15 gram protein and 60 kcal   ? rocuronium (ZEMURON) 10 mg/mL injection   ? rosuvastatin (CRESTOR) tablet 10 mg   ? sodium chloride 0.9 % flush 3 mL   ? sodium chloride 0.9 % flush 3 mL   ? sodium chloride 3 % nebulizer solution 4 mL   ?  traZODone (DESYREL) tablet 25 mg   ? vancomycin (VANCOCIN) 2 g in sodium chloride 0.9% 500 mL IVPB       Objective:     Vitals:  Temp:  [97.6 ?F (36.4 ?C)-101.3 ?F (38.5 ?C)] 99.32 ?F (37.4 ?C)  Pulse:  [78-147] 78  Resp:  [0-27] 16  BP: (71-176)/(48-149) 100/53  SpO2:  [97 %-100 %] 100 %  Device (Oxygen Therapy): mechanical ventilator  O2 Flow (L/min):  [8-15] 15    Intake/Output Summary (Last 24 hours) at 10/15/2020 0756  Last data filed at 10/15/2020 0700  Gross per 24 hour   Intake 3545.56 ml   Output 1535 ml   Net 2010.56 ml       Physical Exam:  Chart reviewed only    Labs:  Lab Results   Component Value Date    WBC 8.3 10/15/2020    HGB 7.6 (L) 10/15/2020    HCT 24.00 (L) 10/15/2020    MCV 88.2 10/15/2020    PLT 40 (L) 10/15/2020     Lab Results   Component Value Date    INR 1.02 10/15/2020    INR 1.07 10/13/2020       SARS-CoV-2 RNA (COVID-19) Date/Time Value Ref Range Status   10/09/2020 09:17 PM Negative Negative Final     Comment:     This assay is a Nucleic Acid Amplification Test (NAAT)/RT-PCR or TMA Paediatric nurse System). This is run on the Panther system. It has been validated for clinical use by the [facility] (CLIA #: [id number]). It has been granted Emergency Use Authorization by the [name] FDA.    Note that falsely negative results can be due to poor sample quality, suboptimal sample type, low viral load, and viral genome variability.     Patient Information: [email or web address]  Provider Information: [email or web address]    Test performance has not been evaluated in asymptomatic patients. Test ordering and result interpretation is at the discretion of the ordering provider.    Test performed at:    [facility]  [address]  [place], [place] [address]  Phone: [phone]  Director: Quinn Axe, MD  CLIA#: [id number]         Diagnostics:  Reviewed    Assessment/Plan:     The 55 year old gentleman with a history of laryngeal cancer currently on treatment, trach that is having recurrent episodes of secretions and mucus plugging in his trach for which IP has been consulted for comment on management.    I reviewed the patient's imaging extensively in spoke with the primary team yesterday about the patient's current condition.  He has a trach in due to progressive laryngeal cancer and suprastomally has near complete obstruction/occlusion of his trachea.  He continues to have secretions from this area of tumor that fall below his trach, he subsequently coughs up, and due to the thickness of his secretions when he coughs it up it occludes his trach and he requires more intensive management, namely aggressive suctioning lavage and for positive pressure ventilation. Unfortunately we do not have any bronchoscopic solutions to this problem, be it stent or otherwise. Not clear that given the degree of subglottic occlusion that we could even fit a stent in that area, but even if we could, it would not provide benefit to the patient. The best course of action is probably to keep his cuff inflated to prevent secretions from following below. There are some trachs that have subglottic suction ports/valves that might help alleviate  this problem, however it is not clear that his airway is amenable to these types of trachs, but the primary team could consider switching to a Portex      - no interventional pulmonary solutions to the patients problem  - could consider a Portex blue line Ultra suctionaid trach (we do not have these would need to be special ordered)     Discussed with Dr. Durenda Guthrie    Thank you for including [name] in your patients care.     Geryl Rankins, MD   Interventional Pulmonary Fellow  Please call [phone] with any questions or concerns.  October 15, 2020

## 2020-10-15 NOTE — Plan of Care
Plan of Care Overview/ Patient Status    0700-1900Patient A&O x4, assist x1/2 T&P not OOB this shift, bed alarm on. RSLP Dressing changed this Shift 2/9 C/D/I, + BR patent. Cardiac: HTN to 160's MD aware, tachy to 120's recovered after pain medication given. Aferbile.Resp: Kreg [place] w/ deflated cuff. Necrotic/pink tissue surrounding. Copious secretions around trach- dressing applied changed 3x. Pt sating WDL on 50% at 15Lpm weaned by respiratory to 40% 10Lpm. Urins trach mask- refusing direct connection. No SOB. Lungs coarse throughout. Productive cough with tan/thick secretions. Suctioned Q2h with 10 french suction catheter.GI: LBM 2/8. Abdomen soft/non-tender +BS, passing flatus. PEG tube site C/D/I Received 10am and 2pm feed (given at 4pm due to [place] sim) Pt tolerated well residuals <200. Diabeta source.  ZO:XWRUEA meals. 6pm feed not given due to RRT. Will receive continuous overnight feeds. Pills crushed through PEG tube. 4:30pm- PT given Oral contrast through PEG tube. Pt unable to tolerate total amount. taken out from PEG tube- MD Aware, discarded.  GU: Voiding CYU in Urinal.  Skin: Intact, T&R performed IND Q2H to prevent skin injury. CHG bath performed.Pain to trach site given roxi 5mg  2x w/ +effect.  GOC meeting thjis morning, will follow up again on Friday with family. Started [facility] sim, will complete RXT next week.  Call bell in reach, frequent rounding, rest promoted, [facility].Adden: [id number]- pt call on call bell- pt unable to breath. This RN and Psychologist, clinical to bedside. Pt sating >98% on humidified trach mash 40% 10Lpm. Pt suctioned with 10 french catheter mucous with blood- increased secretions around trach. Pt unrelieved by suctioning, WOB increased respirations high 20's. Diaphoretic tachy to 130's HTN to 160's. MD to bedside, charge RN to bedside. Respiratory called, ENT called. NP15 nurse to bedside to help with suctioning, 20french with lavage used. Pt WOB still increasing w/out relief. AMBU bagged used for intermittent ventilation. Trach cuff inflated by NP 15 RN. RRT called. SWAT RN to bedside. EKG obtained sinus tach, CXR obtained. ENT suctioning with 57french and lavage- using scope for better visualization. Pt w/ bloody secretions in and around trach. Placed on Ventilator by Respiratory therapist. Pt WOB decreased HR down to 120's, feeling more comfortable. Pt to remain on ventilator while awaiting MICU or ICU bed. Report given to oncoming nurse Huntley Dec, SWAT remained at bedside.

## 2020-10-16 ENCOUNTER — Encounter: Admit: 2020-10-16 | Payer: PRIVATE HEALTH INSURANCE | Attending: Anesthesiology

## 2020-10-16 ENCOUNTER — Encounter: Admit: 2020-10-16 | Payer: PRIVATE HEALTH INSURANCE | Attending: Otolaryngology

## 2020-10-16 DIAGNOSIS — E7800 Pure hypercholesterolemia, unspecified: Secondary | ICD-10-CM

## 2020-10-16 DIAGNOSIS — I10 Essential (primary) hypertension: Secondary | ICD-10-CM

## 2020-10-16 DIAGNOSIS — E119 Type 2 diabetes mellitus without complications: Secondary | ICD-10-CM

## 2020-10-16 LAB — CBC WITH AUTO DIFFERENTIAL
BKR WAM ABSOLUTE IMMATURE GRANULOCYTES.: 0.1 x 1000/ÂµL (ref 0.00–0.30)
BKR WAM ABSOLUTE LYMPHOCYTE COUNT.: 0.37 x 1000/ÂµL — ABNORMAL LOW (ref 0.60–3.70)
BKR WAM ABSOLUTE NRBC (2 DEC): 0 x 1000/ÂµL (ref 0.00–1.00)
BKR WAM ANALYZER ANC: 7.16 x 1000/ÂµL (ref 2.00–7.60)
BKR WAM BASOPHIL ABSOLUTE COUNT.: 0.02 x 1000/ÂµL (ref 0.00–1.00)
BKR WAM BASOPHILS: 0.2 % (ref 0.0–1.4)
BKR WAM EOSINOPHIL ABSOLUTE COUNT.: 0.32 x 1000/ÂµL (ref 0.00–1.00)
BKR WAM EOSINOPHILS: 3.5 % (ref 0.0–5.0)
BKR WAM HEMATOCRIT (2 DEC): 23.5 % — ABNORMAL LOW (ref 38.50–50.00)
BKR WAM HEMOGLOBIN: 7.5 g/dL — ABNORMAL LOW (ref 13.2–17.1)
BKR WAM IMMATURE GRANULOCYTES: 1.1 % — ABNORMAL HIGH (ref 0.0–1.0)
BKR WAM LYMPHOCYTES: 4 % — ABNORMAL LOW (ref 17.0–50.0)
BKR WAM MCH (PG): 28 pg (ref 27.0–33.0)
BKR WAM MCHC: 31.9 g/dL (ref 31.0–36.0)
BKR WAM MCV: 87.7 fL (ref 80.0–100.0)
BKR WAM MONOCYTE ABSOLUTE COUNT.: 1.17 x 1000/ÂµL — ABNORMAL HIGH (ref 0.00–1.00)
BKR WAM MONOCYTES: 12.8 % — ABNORMAL HIGH (ref 4.0–12.0)
BKR WAM MPV: 11.8 fL (ref 8.0–12.0)
BKR WAM NEUTROPHILS: 78.4 % — ABNORMAL HIGH (ref 39.0–72.0)
BKR WAM NUCLEATED RED BLOOD CELLS: 0 % (ref 0.0–1.0)
BKR WAM PLATELETS: 44 x1000/ÂµL — ABNORMAL LOW (ref 150–420)
BKR WAM RDW-CV: 15.3 % — ABNORMAL HIGH (ref 11.0–15.0)
BKR WAM RED BLOOD CELL COUNT.: 2.68 M/ÂµL — ABNORMAL LOW (ref 4.00–6.00)
BKR WAM WHITE BLOOD CELL COUNT: 9.1 x1000/ÂµL (ref 4.0–11.0)

## 2020-10-16 LAB — BASIC METABOLIC PANEL
BKR ANION GAP: 11 (ref 7–17)
BKR BLOOD UREA NITROGEN: 45 mg/dL — ABNORMAL HIGH (ref 6–20)
BKR BUN / CREAT RATIO: 19.7 (ref 8.0–23.0)
BKR CALCIUM: 8.3 mg/dL — ABNORMAL LOW (ref 8.8–10.2)
BKR CHLORIDE: 98 mmol/L (ref 98–107)
BKR CO2: 26 mmol/L (ref 20–30)
BKR CREATININE: 2.29 mg/dL — ABNORMAL HIGH (ref 0.40–1.30)
BKR EGFR (AFR AMER): 36 mL/min/{1.73_m2} (ref >60)
BKR EGFR (NON AFRICAN AMERICAN): 30 mL/min/{1.73_m2} (ref >60)
BKR GLUCOSE: 263 mg/dL — ABNORMAL HIGH (ref 70–100)
BKR POTASSIUM: 4.5 mmol/L (ref 3.3–5.3)
BKR SODIUM: 135 mmol/L — ABNORMAL LOW (ref 136–144)

## 2020-10-16 LAB — RETICULOCYTES
BKR WAM IRF: 28.3 % — ABNORMAL HIGH (ref 3.0–15.9)
BKR WAM RETICULOCYTE - ABS (3 DEC): 0.073 10Ë6 cells/uL (ref 0.023–0.140)
BKR WAM RETICULOCYTE COUNT PCT (1 DEC): 2.7 % (ref 0.6–2.7)
BKR WAM RETICULOCYTE HGB EQUIVALENT: 34.7 pg (ref 28.2–35.7)

## 2020-10-16 LAB — PT/INR AND PTT (BH GH L LMW YH)
BKR INR: 0.98 (ref 0.87–1.14)
BKR PARTIAL THROMBOPLASTIN TIME: 21.8 s — ABNORMAL LOW (ref 23.0–31.4)
BKR PROTHROMBIN TIME: 10.7 s (ref 9.6–12.3)

## 2020-10-16 LAB — PHOSPHORUS     (BH GH L LMW YH): BKR PHOSPHORUS: 3.6 mg/dL (ref 2.2–4.5)

## 2020-10-16 LAB — IMMATURE PLATELET FRACTION (BH GH LMW YH)
BKR WAM IPF, ABSOLUTE: 3.3 x1000/ÂµL (ref <20.0)
BKR WAM IPF: 7.6 % (ref 1.2–8.6)

## 2020-10-16 LAB — HAPTOGLOBIN: BKR HAPTOGLOBIN: 272 mg/dL — ABNORMAL HIGH (ref 30–200)

## 2020-10-16 LAB — MAGNESIUM: BKR MAGNESIUM: 2 mg/dL (ref 1.7–2.4)

## 2020-10-16 LAB — FIBRINOGEN     (BH GH L LMW YH): BKR FIBRINOGEN LEVEL: 586 mg/dL — ABNORMAL HIGH (ref 194–448)

## 2020-10-16 LAB — LACTATE DEHYDROGENASE: BKR LACTATE DEHYDROGENASE: 211 U/L (ref 122–241)

## 2020-10-16 LAB — VANCOMYCIN, RANDOM     (BH GH LMW YH): BKR VANCOMYCIN RANDOM: 25.2 ug/mL — ABNORMAL HIGH

## 2020-10-16 MED ORDER — DEXMEDETOMIDINE 400 MCG/100 ML (4 MCG/ML) IN 0.9 % SODIUM CHLORIDE IV
4001004 mcg/100 mL (4 mcg/mL) | INTRAVENOUS | Status: DC
Start: 2020-10-16 — End: 2020-10-17
  Administered 2020-10-16 – 2020-10-17 (×3): 400 mL/h via INTRAVENOUS

## 2020-10-16 MED ORDER — INSULIN GLARGINE 100 UNIT/ML SUBCUTANEOUS SOLUTION
100 unit/mL | Freq: Every day | SUBCUTANEOUS | Status: DC
Start: 2020-10-16 — End: 2020-10-17
  Administered 2020-10-16: 17:00:00 100 mL via SUBCUTANEOUS

## 2020-10-16 MED ORDER — VANCOMYCIN MAR LEVEL
Freq: Once | INTRAVENOUS | Status: CP
Start: 2020-10-16 — End: ?

## 2020-10-16 MED ORDER — ACETAMINOPHEN 650 MG/20.3 ML ORAL SUSPENSION
650 mg/20.3 mL | Freq: Four times a day (QID) | GASTROSTOMY | Status: DC | PRN
Start: 2020-10-16 — End: 2020-10-25
  Administered 2020-10-17 – 2020-10-23 (×7): 650 mL via GASTROSTOMY

## 2020-10-16 MED ORDER — POLYETHYLENE GLYCOL 3350 17 GRAM ORAL POWDER PACKET
17 gram | Freq: Every day | GASTROSTOMY | Status: DC
Start: 2020-10-16 — End: 2020-10-21

## 2020-10-16 MED ORDER — LACTATED RINGERS IV BOLUS (NEW BAG)
Freq: Once | INTRAVENOUS | Status: CP
Start: 2020-10-16 — End: ?
  Administered 2020-10-16: 11:00:00 500.000 mL/h via INTRAVENOUS

## 2020-10-16 MED ORDER — TRAZODONE (DESYREL) 25 MG HALFTAB
25 mg | Freq: Every evening | GASTROSTOMY | Status: DC
Start: 2020-10-16 — End: 2020-10-22
  Administered 2020-10-17 – 2020-10-22 (×6): 25 mg via GASTROSTOMY

## 2020-10-16 MED ORDER — POLYETHYLENE GLYCOL 3350 17 GRAM ORAL POWDER PACKET
17 gram | Freq: Two times a day (BID) | GASTROSTOMY | Status: DC
Start: 2020-10-16 — End: 2020-10-16

## 2020-10-16 MED ORDER — DOCUSATE SODIUM 50 MG/5 ML ORAL LIQUID
50 mg/5 mL | Freq: Two times a day (BID) | NASOGASTRIC | Status: DC
Start: 2020-10-16 — End: 2020-10-19
  Administered 2020-10-17: 01:00:00 50 mL via NASOGASTRIC

## 2020-10-16 MED ORDER — SODIUM CHLORIDE 0.9 % BOLUS (NEW BAG)
0.9 % | Freq: Once | INTRAVENOUS | Status: CP
Start: 2020-10-16 — End: ?
  Administered 2020-10-16: 17:00:00 0.9 mL/h via INTRAVENOUS

## 2020-10-16 MED ORDER — HYDROMORPHONE 0.5 MG/0.5 ML INJECTION SYRINGE
0.5 mg/ mL | INTRAVENOUS | Status: DC | PRN
Start: 2020-10-16 — End: 2020-10-20
  Administered 2020-10-19 – 2020-10-20 (×4): 0.5 mL via INTRAVENOUS

## 2020-10-16 MED ORDER — INSULIN U-100 REGULAR HUMAN 100 UNIT/ML INJECTION SOLUTION
100 unit/mL | Freq: Four times a day (QID) | SUBCUTANEOUS | Status: DC
Start: 2020-10-16 — End: 2020-10-17
  Administered 2020-10-16 – 2020-10-17 (×4): 100 mL via SUBCUTANEOUS

## 2020-10-16 NOTE — Other
Arthur [name]    [facility]     Palliative Care Initial Consult Note    Present History   Arthur [name] is a 55 y.o. 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 Admitted to SICU on 2/9 for ventilator management following RRT on oncology floor for desaturation and increased work of breathing while on trach mask, ultimately placed back on vent. Referred for Palliative Care Consult by   Attending Provider: Alric Quan, MD [phone] for supportive care.    Chart reviewed and case discussed with SICU team. I examined Arthur [name] this morning. He remains intubated and sedated, no cues to pain or agitation. Does not rouse to voice. No family at bedside at this time.    I placed a phone call to patient's niece Arthur [name], with whom the SICU team has primarily been communicating, with plan to introduce palliative care team and support. Reached voicemail and left a HIPAA-compliant message. No call back as of time of writing. Will plan to follow up tomorrow.    Per discussion with SICU team, Arthur [name] case is scheduled to be reviewed by tumor board on Monday, and he is due to start palliative chemo on 2/15. Possible plan to readdress goals of care following tumor board meeting as needed.     General Review of Systems  Review of Systems   Unable to perform ROS: Intubated     Review of Allergies/Hx/Meds   Allergies  Patient has No Known Allergies.    Medical History  Past Medical History:   Diagnosis Date   ? Diabetes mellitus (HC Code) (HC CODE)    ? Hypercholesteremia    ? Hypertension        Family History  Family History   Problem Relation Age of Onset   ? Aneurysm Mother        Social History  Social History     Socioeconomic History   ? Marital status: Legally Separated     Spouse name: Not on file   ? Number of children: Not on file   ? Years of education: Not on file   ? Highest education level: Not on file   Occupational History   ? Not on file   Tobacco Use   ? Smoking status: Former Smoker     Packs/day: 1.00     Years: 30.00     Pack years: 30.00     Quit date: 03/04/2013     Years since quitting: 7.6   Substance and Sexual Activity   ? Alcohol use: Yes     Alcohol/week: 14.0 standard drinks     Types: 14 Cans of beer per week   ? Drug use: Not on file   ? Sexual activity: Not on file   Other Topics Concern   ? Not on file   Social History Narrative   ? Not on file     Social Determinants of Health     Financial Resource Strain:    ? Difficulty of Paying Living Expenses:    Food Insecurity:    ? Worried About Programme researcher, broadcasting/film/video in the Last Year:    ? Barista in the Last Year:    Transportation Needs:    ? Freight forwarder (Medical):    ? Lack of Transportation (  Non-Medical):    Physical Activity:    ? Days of Exercise per Week:    ? Minutes of Exercise per Session:    Stress:    ? Feeling of Stress :    Social Connections:    ? Frequency of Communication with Friends and Family:    ? Frequency of Social Gatherings with Friends and Family:    ? Attends Religious Services:    ? Active Member of Clubs or Organizations:    ? Attends Banker Meetings:    ? Marital Status:    Intimate Partner Violence:    ? Fear of Current or Ex-Partner:    ? Emotionally Abused:    ? Physically Abused:    ? Sexually Abused:        InPatient Medications  Current Facility-Administered Medications   Medication Dose Route Frequency Last Rate   ? acetaminophen  650 mg Oral Q6H PRN     ? albuterol  2.5 mg Nebulization Q6H PRN     ? albuterol  2.5 mg Nebulization Q4H     ? atropine  2 mg Sublingual Q4H     ? cefTRIAXone  1 g IV Push Q24H     ? chlorhexidine gluconate  15 mL Mouth/Throat Q12H     ? dexmedetomidine  0.2-0.7 mcg/kg/hr Intravenous Continuous Stopped (10/14/20 2346)   ? dextrose (GLUCOSE) 40 % gel 15 g  15 g Oral Q15 MIN PRN      Or   ? fruit juice  120 mL Oral Q15 MIN PRN      Or   ? skim milk  240 mL Oral Q15 MIN PRN     ? dextrose (GLUCOSE) 40 % gel 30 g  30 g Oral Q15 MIN PRN      Or   ? fruit juice  240 mL Oral Q15 MIN PRN     ? dextrose 50% in water (D50W)  12.5 g IV Push Q15 MIN PRN     ? dextrose 50% in water (D50W)  25 g IV Push Q15 MIN PRN     ? [START ON 10/16/2020] famotidine  20 mg Per G Tube Daily     ? fentaNYL  0.5-5.33 mcg/kg/hr Intravenous Continuous 1.5 mcg/kg/hr (10/15/20 1200)   ? fentaNYL  50 mcg Intravenous Q30 MIN PRN     ? [Held by provider] furosemide  20 mg Per G Tube Daily     ? glucagon  1 mg Intramuscular Once PRN     ? insulin lispro  14 Units Subcutaneous Daily     ? insulin lispro  14 Units Subcutaneous Daily     ? insulin lispro  8 Units Subcutaneous Daily     ? insulin lispro   Subcutaneous Daily     ? insulin lispro   Subcutaneous 3 times per day     ? insulin NPH human  28 Units Subcutaneous Nightly     ? LORazepam         ? LORazepam  0.5 mg Per G Tube Q6H PRN     ? oxyCODONE  10 mg Per G Tube Q6H PRN     ? oxyCODONE  5 mg Per G Tube Q4H PRN     ? oxymetazoline  1 spray each nostril BID PRN     ? phenylephrine  0.25-9 mcg/kg/min Intravenous Continuous 0.75 mcg/kg/min (10/15/20 1200)   ? propofol (DIPRIVAN) 1,000 mg in 100 mL (10 mg/mL)  5-80 mcg/kg/min Intravenous Continuous 50  mcg/kg/min (10/15/20 1200)   ? amino acids-protein hydrolys  2 packet Per G Tube TID     ? rocuronium         ? rosuvastatin  10 mg Per G Tube Daily     ? sodium chloride  3 mL IV Push Q8H     ? sodium chloride  3 mL IV Push PRN for Line Care     ? sodium chloride  4 mL Nebulization Q4H     ? traZODone  25 mg Per G Tube Nightly PRN     ? Vancomycin MAR level   Intravenous Once     ? Vancomycin Therapy Placeholder   Intravenous placeholder         [email or web address]    Palliative Performance Scale 10% (intubated)  (Modification of Karnofsky and was designed for measurement of physical status in Palliative Care. Only 10% of patients with PPS score <=50% would be expected to survive >6 months)    Physical Exam  Vitals and nursing note reviewed.   Constitutional:       General: He is not in acute distress.     Appearance: He is ill-appearing.      Interventions: He is sedated, intubated and restrained.   Pulmonary:      Effort: He is intubated.         Vitals:  Temp:  [97.7 ?F (36.5 ?C)-101.3 ?F (38.5 ?C)] 99.32 ?F (37.4 ?C)  Pulse:  [70-147] 70  Resp:  [0-27] 16  BP: (71-176)/(48-149) 117/62  SpO2:  [97 %-100 %] 100 %     Wt Readings from Last 3 Encounters:   10/15/20 131.3 kg   10/06/20 (!) 139.5 kg   09/29/20 (!) 142.4 kg       Review of Labs/Diagnostics  Complete Blood Count:  Recent Labs   Lab 10/13/20  0424 10/14/20  0558 10/15/20  0630   WBC 7.2 7.4 8.3   HGB 7.8* 7.7* 7.6*   HCT 24.10* 24.10* 24.00*   PLT 40* 37* 40*     Comprehensive Metabolic Panel:  Recent Labs   Lab 10/13/20  0424 10/13/20  0742 10/14/20  0558 10/14/20  0626 10/15/20  0055 10/15/20  0630 10/15/20  0917   NA 137  --  137  --   --  136  --    K 3.9  --  4.1  --   --  4.7  --    CL 100  --  100  --   --  98  --    CO2 27  --  27  --   --  28  --    BUN 23*  --  26*  --   --  34*  --    CREATININE 1.61*  --  1.44*  --   --  1.96*  --    GLU 236*   < > 266*   < >   < > 290* 282*   CALCIUM 9.5  --  9.2  --   --  8.8  --     < > = values in this interval not displayed.     Recent Labs   Lab 10/15/20  0630   ALKPHOS 72   BILITOT 0.2   PROT 5.1*   ALT 17   AST 17       Diagnostics:  XR Chest PA or AP    Result Date: 10/15/2020  XR CHEST PA OR AP Date: 10/15/2020 12:11 AM  INDICATION: acute increased work of breathing. COMPARISON: XR CHEST PA OR AP 2022-Feb-09 18:31:29.000 FINDINGS: Tracheostomy and Port-A-Cath are in a stable position. Cardiomediastinal silhouette is unremarkable. No focal consolidation, atelectasis, pulmonary edema, pleural effusion or pneumothorax is seen.     No acute abnormality. Reported And Signed By: Madie Reno, MD  [facility]    XR Chest PA or AP    Result Date: 10/14/2020  XR CHEST PA OR AP Date: 10/14/2020 6:40 PM INDICATION: Shortness of breath. COMPARISON: None FINDINGS: Support Devices: Tracheostomy. Right chest wall therapy port tip terminates in the right atrium. Low lung volumes. Mild interstitial edema. Consolidation at the right lung base which may be on the basis of atelectasis or pneumonia. No pneumothorax. Heart is normal in size.     Mild interstitial edema. Consolidation at the right lung base which may be on the basis of atelectasis or pneumonia. Reported And Signed By: Glenna Durand, MD  [facility]        Impression/Plan:   Artyom Stencel is a 55 y.o. 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 Admitted to SICU on 2/9 for ventilator management following RRT on oncology floor for desaturation and increased work of breathing while on trach mask, ultimately placed back on vent. Referred for Palliative Care Consult by Attending Provider: Alric Quan, MD [phone] for supportive care.    Coping/Support:   - Ongoing assessments for psychosocial and/or spiritual needs  - Full interdisciplinary team support available as needed    Advance Care Planning:  - Full code  - No advance directives or health care proxy documentation on file  - His sister and niece are listed as emergency contacts and it appears that his sister is next of kin and default proxy decision maker in the event that patient lacks capacity    Goals of Care:  - Unable to explore with patient and family at this time.  - Per chart review, it appears that GOC were most recently readdressed yesterday prior to RRT. At that time, it was explained that curative treatment is no longer possible, and any subsequent cancer-directed therapies will be palliative in nature. Patient had confirmed that he wished to continue with chemo/radiation.   - Plan was for tumor board to review his case on 2/14, with chemo to be initiated on 2/15.     Palliative Care will continue to follow for symptom management and support. We remain available to assist with goals of care discussions as patient's condition evolves.     Thank you for involving the Palliative Care team in the care of this patient. The Palliative Care team will continue to follow.    Patient was evaluated: in person    Time committed: > 40 minutes were spent in chart review, coordination of care, and at the bedside in patient/family counseling and education, including detailed concepts of symptom managment and goals of care/advanced care planning.    Signed:  Debbrah Alar, APRN  Palliative Care Consult Service  10/15/2020 12:21 PM

## 2020-10-16 NOTE — Consults
[facility]Sm[facility] CenterHematology eConsult NoteCONSULTED BY: Attending Provider: Alric Quan, MD [phone] OF SERVICE: 2/11/2022REASON FOR CONSULT: Comanagement while pt is off onc floorHistory of Present Illness:  HPI: 55 y.o. 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, T3NxMx L laryngeal SCC diagnosed 06/2020, started on cis+RT 09/2020 (received cis *2 and RT 35/70 Gy), s/p trach/PEG, who was transferred from [place]?on 2/4?due to SOB w /exam significant for tumor invading the trach stoma and scope showing partial obstruction.?[name] was changed to a Bivona on 2/5, which bypasses the obstruction.During this admission, [place] of the neck was performed on 02/05 which showed a laryngeal mass with stromal invasion and involvement of the thyroid gland, to trachea, hypopharynx, and proximal cervical esophagus with left level 3 and level 4 lymphadenopathy and incidentally found a right IJ thrombus. [place] chest showed mediastinal adenopathy (including a subcarinal LN 2.4 cm) and circumferential thickening of trachea concerning for metastatic disease. CTAP on 2/9 showed new borderline retroperitoneal LAD (e.g., 1.1 cm paraortic LN). On 2/7, a bedside biopsy was performed for tumor profiling and path.Since admission, he has had multiple episodes of tachypnea 2/2 mucus plugging, for which TAART/RRT was called. Pulm was consulted for possible trach stent placement, who did not think pt has an indication for it as mucus plugging was the etiology. His trach was connected to vent and transferred to SICU further management 10/14/2020. He is currently CTX and vancomycin for possible PNA. His course was also complicated with prolonged cytopenia, particularly thrombocytopenia. Hematology was consulted for it and thought it might be ITP+/- chemotherapy effects, and recommended considering dex 40 * 4 doses.For oncological treatment planning, he was evaluated by radiation oncology. His primary oncologist Dr. Drenda Freeze and head and neck oncologist at [facility] Dr. Laurina Bustle were contacted for input. He currently has at least locally advanced disease, if not metastatic disease. Will no longer be treated with curative intent. Next step would be palliative radiation with concurrent chemotherapy. Given his persistent thrombocytopenia, plan to delay palliative RT to 2/15. Medical History: Past Medical History Past Surgical History Past Medical History: Diagnosis Date ? Diabetes mellitus (HC Code) (HC CODE)  ? Hypercholesteremia  ? Hypertension   No past surgical history on file. Social History Family History Social History Tobacco Use ? Smoking status: Former Smoker   Packs/day: 1.00   Years: 30.00   Pack years: 30.00   Quit date: 03/04/2013   Years since quitting: 7.6 Substance Use Topics ? Alcohol use: Yes   Alcohol/week: 14.0 standard drinks   Types: 14 Cans of beer per week ? Drug use: Not on file Social History Substance and Sexual Activity Drug Use Not on file  Family History Problem Relation Age of Onset ? Aneurysm Mother   Outpatient Medications Prior to Admission Medication Medication Sig Dispense Refill ? atorvastatin (LIPITOR) 20 mg tablet TAKE 1 TABLET BY MOUTH ONCE DAILY   ? famotidine (PEPCID) 20 mg tablet Take 20 mg by mouth 2 (two) times daily.   ? furosemide (LASIX) 20 mg tablet 1 tablet (20 mg total) by Per G Tube route daily. 30 tablet 1 ? insulin aspart U-100 (NOVOLOG) 100 unit/mL vial Inject under the skin 3 (three) times daily before meals. 8 units before breakfast and lunch, 14 units before dinner   ? insulin glargine,hum.rec.anlog (TOUJEO SOLOSTAR U-300 INSULIN SUBQ) Inject 62 Units under the skin daily.    ? lisinopriL (PRINIVIL,ZESTRIL) 10 mg tablet TAKE 1 TABLET BY  MOUTH ONCE DAILY   ? LORazepam (ATIVAN) 0.5 mg tablet Take 0.5 mg by mouth every 6 (six) hours as needed for anxiety.   ? metFORMIN (GLUCOPHAGE) 1000 mg tablet Take 1,000 mg by mouth daily.    ? albuterol (PROVENTIL, VENTOLIN) 2.5 mg /3 mL (0.083 %) nebulizer solution Take 3 mLs by nebulization daily.   ? azelastine (ASTELIN) 137 mcg (0.1 %) nasal spray Use 2 sprays in each nostril every 8 (eight) hours as needed for rhinitis. Use in each nostril as directed (Patient not taking: Reported on 10/11/2020)   ? magnesium oxide (MAGOX) 400 mg (241.3 mg magnesium) tablet 1 tablet (400 mg total) by Per G Tube route daily. 200 tablet 1 ? oxyCODONE (ROXICODONE) 5 mg Immediate Release tablet Take 5 mg by mouth every 4 (four) hours as needed.   ? polyethylene glycol (MIRALAX) 17 gram packet Take 17 g by mouth daily. Mix in 8 ounces of water, juice, soda, coffee or tea prior to taking. (Patient not taking: Reported on 10/11/2020)   ? traZODone (DESYREL) 50 mg tablet Take 25 mg by mouth nightly. (Patient not taking: Reported on 10/11/2020)    Current Medications Scheduled Meds:Current Facility-Administered Medications Medication Dose Route Frequency Provider Last Rate Last Admin ? albuterol neb sol 2.5 mg/3 mL (0.083%) (PROVENTIL,VENTOLIN)  2.5 mg Nebulization Q4H Carollee Sires, PA   2.5 mg at 10/16/20 0800 ? atropine 1 % SUBLINGUAL solution 2 mg  2 mg Sublingual Q4H [place] Dec Aileen Pilot, MD   2 mg at 10/16/20 1610 ? cefTRIAXone (ROCEPHIN) 1 g in sodium chloride 0.9 % 10 mL (100 mg/mL)  1 g IV Push Q24H Carollee Sires, PA   1 g at 10/15/20 1003 ? chlorhexidine gluconate (PERIDEX) 0.12 % solution 15 mL  15 mL Mouth/Throat Q12H [place] Dec Aileen Pilot, MD   15 mL at 10/16/20 9604 ? famotidine (PEPCID) tablet 20 mg  20 mg Per G Tube Daily Irine Seal, MD   20 mg at 10/16/20 5409 ? insulin glargine (Semglee,Lantus) injection 10 Units  10 Units Subcutaneous Daily Gearldine Shown, [place]   10 Units at 10/15/20 1714 ? insulin regular human (HumuLIN R, NovoLIN R) 100 unit/mL injection 5 Units  5 Units Subcutaneous Q6H Madlener, [place], PA   5 Units at 10/16/20 0548 ? insulin regular human (HumuLIN R, NovoLIN R) Sliding Scale (See admin instructions for dose)   Subcutaneous Q6H Madlener, [place], PA   4 Units at 10/16/20 0549 ? LORazepam (ATIVAN) 2 mg/mL injection          ? Prosource no carb 15 gram protein and 60 kcal  2 packet Per G Tube TID Gearldine Shown, PA   2 packet at 10/15/20 2100 ? rocuronium (ZEMURON) 10 mg/mL injection          ? rosuvastatin (CRESTOR) tablet 10 mg  10 mg Per G Tube Daily Jeanmarie Plant, MD   10 mg at 10/16/20 8119 ? sodium chloride 3 % nebulizer solution 4 mL  4 mL Nebulization Q4H Carollee Sires, PA   4 mL at 10/16/20 0800 ? Vancomycin MAR Level   Intravenous Once Irine Seal, MD     ? Vancomycin Therapy Placeholder   Intravenous placeholder Irine Seal, MD     Continuous Infusions:? fentaNYL Stopped (10/16/20 0556) ? phenylephrine 0.5 mcg/kg/min (10/16/20 1478) ? propofol (DIPRIVAN) 1,000 mg in 100 mL (10 mg/mL) 15 mcg/kg/min (10/16/20 0700) PRN Meds:.acetaminophen, albuterol, dextrose 50% in water (D50W), dextrose 50% in water (D50W), fentaNYL, glucagon, LORazepam, oxyCODONE **OR**  oxyCODONE, oxymetazoline, traZODone Allergies No Known Allergies Review of Systems All other systems reviewed and are negative.As above. Remainder of 14 point ROS reviewed and negative and non contributory. Vital signs and Exam: Vitals:Temp:  [98.78 ?F (37.1 ?C)-101.3 ?F (38.5 ?C)] 100.4 ?F (38 ?C)Pulse:  [66-99] 92Resp:  [0-19] 9BP: (97-122)/(48-63) 113/59SpO2:  [98 %-100 %] 98 % I/O's:Intake/Output Summary (Last 24 hours) at 10/16/2020 0823Last data filed at 10/16/2020 0800Gross per 24 hour Intake 3295.34 ml Output 1245 ml Net 2050.34 ml  Physical Exam:Not performed for this econsult encounterObjective Data: MetabolicRecent Labs Lab 02/08/220424 02/08/220424 02/09/220558 02/09/220558 02/10/220630 02/10/220630 02/11/220436 NA 137  --  137  --  136  --  135* K 3.9   < > 4.1   < > 4.7   < > 4.5 CL 100   < > 100   < > 98   < > 98 CO2 27   < > 27   < > 28   < > 26 BUN 23*   < > 26*   < > 34*   < > 45* CREATININE 1.61*   < > 1.44*   < > 1.96*   < > 2.29* ANIONGAP 10   < > 10   < > 10   < > 11 CALCIUM 9.5   < > 9.2   < > 8.8   < > 8.3* MG  --   --  1.9   < > 1.9   < > 2.0 PHOS  --   --   --   --  3.9   < > 3.6  < > = values in this interval not displayed.  HematologicRecent Labs Lab 02/08/220424 02/08/220424 02/09/220558 02/09/220558 02/10/220630 02/10/220630 02/11/220436 WBC 7.2  --  7.4  --  8.3  --  9.1 HGB 7.8*   < > 7.7*   < > 7.6*   < > 7.5* HCT 24.10*   < > 24.10*   < > 24.00*   < > 23.50* PLT 40*   < > 37*   < > 40*   < > 44* NEUTROPHILS 81.6*   < > 79.7*   < > 81.4*   < > 78.4* ANCANC 5.88   < > 5.87   < > 6.75   < > 7.16 GLOB  --   --   --   --  2.3  --   --   < > = values in this interval not displayed.  HepaticRecent Labs Lab 02/10/220630 02/10/220630 02/11/220436 BILITOT 0.2  --   --  ALT 17  --   --  AST 17  --   --  ALKPHOS 72  --   --  PTT 23.1   < > 21.8* INR 1.02   < > 0.98 PROT 5.1*  --   --  ALBUMIN 2.8*  --   --   < > = values in this interval not displayed.  SpecialRecent Labs Lab 02/08/220424 02/10/220630 02/11/220436 LDH 147   < > 211 FIBRINOGEN 465*   < > 586* FERRITIN 641*  --   --   < > = values in this interval not displayed.  Imaging: XR Chest PA or APResult Date: 2/10/2022No acute abnormality. Reported And Signed By: Madie Reno, MD  [facility] Radiology and Biomedical ImagingXR Chest PA or APResult Date: 2/9/2022Mild interstitial edema. Consolidation at the right lung base which may be on the basis of atelectasis or pneumonia. Reported And Signed By: Glenna Durand, MD  [facility] Radiology  and Education administrator Tissue Neck w IV ContrastResult Date: 2/10/2022Tracheostomy tube in place with tip terminating approximately 4 cm above the carina. Similar large necrotic transposition on mass with invasion into the thyroid gland, trachea, hypopharynx, thyroid cartilage and cervical esophagus with extensive cervical mediastinal and supraclavicular lymphadenopathy. Similar partially occluding thrombus in the right internal jugular vein. Report Initiated By:  Kara Mead, MD Reported And Signed By: Forrestine Him, MD  [facility] Radiology and Biomedical ImagingCT Soft Tissue Neck w IV ContrastResult Date: 2/5/2022Large transspatial necrotic laryngeal mass with stomal invasion and involvement of the thyroid gland, trachea, hypopharynx and proximal cervical esophagus as above. Associated necrotic left level 3 and 4 lymphadenopathy as above. 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 Please see concurrently [place] chest examination for additional details regarding the chest. Report Initiated By:  Loel Lofty, MD Reported And Signed By: Remer Macho, MD  [facility] Radiology and Biomedical ImagingCT Chest w IV Contrast (eg: Empyema, Complex pleural and mediastinal masses, staging of central lung cancer)Result Date: 2/5/2022Partially visualized soft tissue neck mass which is also characterized on the concurrent neck [place]. There is mediastinal adenopathy and circumferential thickening of the trachea concerning for metastatic disease. Bilateral dependent consolidations likely represent aspiration. Pulmonary nodules measuring up to 6 mm which are nonspecific but should be followed on future imaging per oncology protocol to exclude metastatic disease. This study was reviewed by Dr. Leilani Merl. If you have any questions or concerns, call or text [phone] to speak to the radiologist. Reported And Signed By: Leilani Merl, MD [facility] Radiology and Biomedical ImagingCT Abdomen Pelvis w IV & Oral Contrast (Eval for metastatic disease TCC)Result Date: 2/10/2022New borderline retroperitoneal lymphadenopathy. Reported And Signed By: Lupita Leash, MD  [facility] Radiology and Biomedical ImagingUS RenalResult Date: 2/7/2022No hydronephrosis. Reported And Signed By: Darrell Jewel, MD  [facility] Radiology and Biomedical ImagingUS Duplex Lower Extremity Venous BilatResult Date: 2/7/2022No evidence of deep venous thrombosis of the bilateral lower extremities. Ultrasound is only of moderate sensitivity for the diagnosis of deep vein thrombosis of the calf. If deep vein thrombosis of the calf is of clinical concern, followup ultrasound examination in 4-7 days would be of value to exclude propagation of clot from the calf. Reported And Signed By: Dulce Sellar, MD  [facility] Radiology and Biomedical ImagingUS Duplex Upper Extremity Venous BilateralResult Date: 2/7/2022No evidence of deep venous thrombosis within the bilateral upper limb/neck deep venous system. Superficial venous thrombosis within the right basilic cubital vein at the antecubital fossa. Reported And Signed By: Darrell Jewel, MD  [facility] Radiology and Biomedical ImagingPathology/Molecular Diagnostics:Surgical Case ?? ? ? ? ? ? ? ? ? ? ? ? ? ? ? ? ? ? ? ? ? ? ?SURGICAL PATHOLOGY REPORT ? ? ? ? ? ? ? ? ? ? ? ? ? ? ? ? ? ? ? ? ? ? ? ? Patient: Arthur [name] ? ? ? ? ? ? ? ? ?Arthur #: [id number] Accession #: [id number] ? ? ? ?Submitted by: Alric Quan, MD FINAL DIAGNOSIS LEFT NECK MASS, BIOPSY: ? ? ? ? ? ? - SQUAMOUS CELL CARCINOMA, WELL DIFFERENTIATED KERATINIZING TYPE  Surgical Consults ?? ? ? ? ? ? ? ? ? ? ? ? ? ? ? ? ? ? ? ? ? ? ?SURGICAL PATHOLOGY CONSULT REPORT ? ? ? ? ? ? ? ? ? ? ? ? ? ? ? ? ? ? ? ? ? ? ? ? Patient: Arthur [name] ? ? ? ? ? ? ? ? ?  Arthur #: [id number] Accession #: [id number] ? ? ? ?Submitted by: Isaiah Blakes Daphene Jaeger, M.D. FINAL DIAGNOSIS [place], [place], , [phone], 4 SL, 06/10/20: ? A. VOCAL CORD, TRUE LEFT, BIOPSY #1 (A.FS01-1 & A.2-1): ? ? ? ? ? - MINUTE FRAGMENT OF SQUAMOUS CELL CARCINOMA B. VOCAL CORD, TRUE LEFT, BIOPSY #2 (B.FS01-1): ? ? ? ? ? - SQUAMOUS CELL CARCINOMA, MODERATELY DIFFERENTIATED KERATINIZING TYPE ? ? ?- SEE NOTE C. VOCAL CORD, TRUE LEFT, BIOPSY #3 (C.1-1): ? ? ? ? ? - SQUAMOUS CELL CARCINOMA, MODERATELY DIFFERENTIATED KERATINIZING TYPE Note: Tumor cells are invading into the stromal tissue. ? ? Pathologist: Lynnell Catalan, M.D. ? ?06/23/2020 13:51 ? ? ?* Report Electronically Signed Out * ? ? ? ? ? ? ? ? ? ? ? ? This electronic signature indicates that the pathologist has personally reviewed the available gross and/or microscopic material and has based the diagnosis on that evaluation.  Assessment and Plan: 55 y.o. 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 His course this admission was complicated by PNA currently on broad-spectrum abx, frequent mucus plugging episodes now on vent requiring SICU transfer, incidental finding on imaging of R IJ thrombosis, and persistent cytopenia particularly thrombocytopenia likely 2/2 ITP +/- chemotherapy effects. Re: his laryngeal SCC, restaging scan this admission showed worsening of local disease (a laryngeal mass with stromal invasion and involvement of the thyroid gland, to trachea, hypopharynx, and proximal cervical esophagus with left level 3 and level 4 lymphadenopathy), as well as metastatic disease. CTAP on 2/9 showed new borderline retroperitoneal LAD (e.g., 1.1 cm paraortic LN), c/f mets. For oncological treatment planning, he was evaluated by radiation oncology. His primary oncologist Dr. Daphene Jaeger and head and neck oncologist at [facility] Dr. Laurina Bustle were contacted for input. He currently has at least locally advanced disease, if not metastatic disease. Will no longer be treated with curative intent. Next step would be palliative radiation with concurrent chemotherapy. Given his persistent thrombocytopenia, plan to delay palliative RT to 2/15. Recommendations-hold off on cancer directed therapy while pt is critically ill-case will be discussed at the TB on [facility] was discussed with Dr. Marchelle Gearing.Jinny Sanders, MD, PhDHematology/Medical oncology fellow, PGY5ATTENDING PHYSICIAN ADDENDUMI have reviewed the medical records, imaging and laboratory results and discussed the case with Dr Judi Cong during morning rounds on Oct 16, 2020. I agree with the above note and assessment.Arthur [name] has no distant metastasis but has aggressive local regrowth of transseptal laryngeal mass with bleeding and recurrent mucus plugging of trach tube. He is not a surgical candidate to resect the lesion. His systemic chemotherapy option is also limited by severe thrombocytopenia. Plt= 44. We agree with palliative radiation. His case will also be discussed at the next H&N tumor board next week. Beryle Beams, MD

## 2020-10-16 NOTE — Progress Notes
Otolaryngology Progress Note:Arthur [name] is a 55 y.o. 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, trach/PEG transferred from [place] due to SOB w/exam significant for tumor invading the trach stoma and scope showing partial obstruction.Interim History:- NAEO, febrile to 101.5F- Scope yesterday with patent airway with current trach tube- Seen by palliative care- [place] neck, abdomen, pelvis yesterday- IP: consider trach with subglottic suction port- VAC 16/450/30%/5- c/o fentanyl, prop, phenylephrineVitals:Temp:  [98.78 ?F (37.1 ?C)-101.3 ?F (38.5 ?C)] 100.4 ?F (38 ?C)Pulse:  [66-99] 92Resp:  [0-19] 9BP: (97-122)/(48-63) 102/54SpO2:  [98 %-100 %] 98 %Device (Oxygen Therapy): mechanical ventilator;tracheostomy collarI/O last 3 completed shifts:In: 3893.7 [I.V.:2953.7; NG/GT:940]Out: 1295 [Urine:1295]Physical Exam:Gen: Sedated, on ventilator, NADEyes: closed, open intermittentlyFace: Grossly symmetricNose: clear anteriorlyOP/OC: hemostaticNeck: Bivona 8 cuffed secured w/soft trach ties, necrotic but hemostatic mass surrounding the stoma covered with dressing, remaining neck flat. Tumor hard.Pulm: on ventilationRecent Labs Lab 02/10/220630 02/10/220630 02/11/220436 WBC 8.3   < > 9.1 HGB 7.6*   < > 7.5* HCT 24.00*   < > 23.50* MCV 88.2   < > 87.7 PLT 40*  --  44*  < > = values in this interval not displayed. Recent Labs Lab 02/10/220630 02/10/220917 02/10/222354 02/11/220436 02/11/220545 NA 136   < >  --  135*  --  K 4.7   < >  --  4.5  --  CL 98   < >  --  98  --  CO2 28   < >  --  26  --  BUN 34*   < >  --  45*  --  CREATININE 1.96*   < >  --  2.29*  --  GLU 290*   < >   < > 263* 216* CALCIUM 8.8   < >  --  8.3*  --  MG 1.9   < >  --  2.0  --  PHOS 3.9  --   --  3.6  --   < > = values in this interval not displayed. Recent Labs Lab 02/10/220630 02/10/220630 02/11/220436 PTT 23.1   < > 21.8* INR 1.02  --  0.98  < > = values in this interval not displayed. Recent Labs Lab 02/07/220140 02/10/220630 ALT  --  17 AST  --  17 BILITOT  --  0.2 ALKPHOS  --  72 LIPASE  --  6* ALBUMIN 2.6* 2.8* Invalid input(s): GLUMETAssessment and Plan: Arthur [name] is a 55 y.o. 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 In SICU for ventilatory support. No acute ENT intervention planned with widely patent airway.- Off propofol and ventilator today if possible- When off ventilator, patient should be transferred back to medical service (step down versus respiratory floor). Patient without planned surgical intervention, but has planned palliative chemoradiation.- No acute ENT intervention- Continue goals of care discussion- may need custom trach, but trach non-obstructed by tumor- will discuss at tumor board - continue routine trach care with careful suctioning- ensure trach well secured with ties- care per med onc, radiation oncology, Idelle Leech, MD (PGY-1)Otolaryngology- Head and Neck SurgeryYale Jackson Park Hospital             ------------Admitted Inpatients: Contact me by [facility] Inpatient Consults: [phone] ED Consults: [phone]

## 2020-10-16 NOTE — Progress Notes
+:+:+:+:+:+:+:+:+:+:+:+:+:+:+:+:+:+:+:+:+:+:+:+:+:+:+:+:+:+:+:+:+:+:+:+:+:+:+:+SICU Attending Note:Arthur [name], who is a critically ill patient in the [facility] SICU, was examined at the bedside; the chart and new notes were reviewed, laboratory data reviewed, events of the past 24 hours discussed, diagnostic imaging reviewed and interpreted in full including the last CXR, and discussed care and coordinated management with: our ICU APPs, primary team, consulting services, our critical care nurses, and respiratory therapy. 55 y.o. male admitted on 10/09/2020 for dyspnea, SOB, new right IJ DVT -- and found to have large necrotic hard mass surrounding the tracheostomy tube, and concern for tube invasion from this laryngeal cancer. ENT scoped and found partial obstruction, so trach changed to a Bivona, bypassing the obstruction. [place] showed extensive disease, with mets and lymphadenopathy. He was transferred to the SICU on night of 2.9.2022 due to mucous plugging and repeated RRT / TAART on the floor secondary to airway plugging (at least 1 daily for 5 days prior to SICU admission) and need for mechanical ventilator through his existing trach. Today is Hospital Day #6 Events in the past 24 hours in the SICU:# admitted to SICU +:+:+:+:+:+:+:+:+:+:+:+:+:+:+:+:+:+:+:+:+:+:+:+:+:+:+:+:+:+:+:+:+:+:+:+:+:+:+:+Past Medical History: Diagnosis Date ? Diabetes mellitus (HC Code) (HC CODE)  ? Hypercholesteremia  ? Hypertension  No past surgical history on file.Scheduled Meds:Current Facility-Administered Medications Medication Dose Route Frequency Provider Last Rate Last Admin ? albuterol neb sol 2.5 mg/3 mL (0.083%) (PROVENTIL,VENTOLIN)  2.5 mg Nebulization Q4H Carollee Sires, PA   2.5 mg at 10/15/20 0744 ? atropine 1 % SUBLINGUAL solution 2 mg  2 mg Sublingual Q4H [place] Dec Aileen Pilot, MD   2 mg at 10/15/20 1610 ? cefTRIAXone (ROCEPHIN) 1 g in sodium chloride 0.9 % 10 mL (100 mg/mL)  1 g IV Push Q24H Carollee Sires, PA     ? chlorhexidine gluconate (PERIDEX) 0.12 % solution 15 mL  15 mL Mouth/Throat Q12H Orland Dec Aileen Pilot, MD   15 mL at 10/15/20 0801 ? [START ON 10/16/2020] famotidine (PEPCID) tablet 20 mg  20 mg Per G Tube Daily Irine Seal, MD     ? [facility] by provider] furosemide (LASIX) tablet 20 mg  20 mg Per G Tube Daily Jeanmarie Plant, MD   20 mg at 10/11/20 0920 ? insulin lispro (Admelog, HumaLOG) 100 unit/mL injection 14 Units  14 Units Subcutaneous Daily Yevonne Aline, [place]   96 Units at 10/14/20 1607 ? insulin lispro (Admelog, HumaLOG) 100 unit/mL injection 14 Units  14 Units Subcutaneous Daily Schumitz, [place], [place]     ? insulin lispro (Admelog, HumaLOG) 100 unit/mL injection 8 Units  8 Units Subcutaneous Daily Yevonne Aline, [place]   8 Units at 10/14/20 1100 ? insulin lispro (Admelog, HumaLOG) Sliding Scale (See admin instructions for dose)   Subcutaneous Daily Abdalla, Parks Neptune, MD     ? insulin lispro (Admelog, HumaLOG) Sliding Scale (See admin instructions for dose)   Subcutaneous 3 times per day Ria Clock, MD   4 Units at 10/14/20 1607 ? insulin NPH human (HumuLIN N,NovoLIN) injection 28 Units  28 Units Subcutaneous Nightly Yevonne Aline, [place]   04 Units at 10/14/20 2209 ? LORazepam (ATIVAN) 2 mg/mL injection          ? Prosource no carb 15 gram protein and 60 kcal  1 packet Per G Tube BID Rutha Bouchard, MD   1 packet at 10/15/20 0801 ? rocuronium (ZEMURON) 10 mg/mL injection          ? rosuvastatin (CRESTOR) tablet 10 mg  10 mg Per G Tube Daily Lelon Perla,  Marcellina Millin, MD   10 mg at 10/15/20 0801 ? sodium chloride 0.9 % flush 3 mL  3 mL IV Push Q8H Jeanmarie Plant, MD   3 mL at 10/15/20 1610 ? sodium chloride 3 % nebulizer solution 4 mL  4 mL Nebulization Q4H Carollee Sires, [place]   4 mL at 10/15/20 0744 ? Vancomycin MAR Level   Intravenous Once Irine Seal, MD     ? Vancomycin Therapy Placeholder Intravenous placeholder Irine Seal, MD     Continuous Infusions:? dexmedetomidine Stopped (10/14/20 2346) ? fentaNYL 1 mcg/kg/hr (10/15/20 0800) ? phenylephrine 0.5 mcg/kg/min (10/15/20 0800) ? propofol (DIPRIVAN) 1,000 mg in 100 mL (10 mg/mL) 40 mcg/kg/min (10/15/20 0800) PRN Meds: acetaminophen, albuterol, dextrose (GLUCOSE) 40 % gel 15 g **OR** fruit juice **OR** skim milk, dextrose (GLUCOSE) 40 % gel 30 g **OR** fruit juice, dextrose 50% in water (D50W), dextrose 50% in water (D50W), fentaNYL, glucagon, LORazepam, oxyCODONE, oxyCODONE, oxymetazoline, sodium chloride, traZODone +:+:+:+:+:+:+:+:+:+:+:+:+:+:+:+:+:+:+:+:+:+:+:+:+:+:+:+:+:+:+:+:+:+:+:+:+:+:+:+Physical ExamVITALS:Temp (24hrs), Avg:99.8 ?F (37.7 ?C), Min:97.6 ?F (36.4 ?C), Max:101.3 ?F (38.5 ?C)Vitals:  10/15/20 0600 10/15/20 0700 10/15/20 0736 10/15/20 0800 BP: (!) 98/52 (!) 100/53  (!) 102/54 Pulse: (!) 102 82 78 78 Resp: 17 (!) 2 16 16  Temp: 99.68 ?F (37.6 ?C) 99.32 ?F (37.4 ?C) 99.32 ?F (37.4 ?C) 99.32 ?F (37.4 ?C) TempSrc: Bladder Bladder   SpO2: 100% 100% 100% 100% Weight: 131.3 kg    Height:     Intake/Output Summary (Last 24 hours) at 10/15/2020 0831Last data filed at 10/15/2020 0811Gross per 24 hour Intake 4193.92 ml Output 1535 ml Net 2658.92 ml Vitals: BP (!) 102/54  - Pulse 78  - Temp 99.32 ?F (37.4 ?C)  - Resp 16  - Ht 6' 2 (1.88 m)  - Wt 131.3 kg  - SpO2 100%  - BMI 37.16 kg/m? I examined them at the bedside today at 8 AMGeneral: Intubated. Neuro: Following commands Psych: Unable to assess.Head: Normocephalic, nontraumaticEars: Unable to assess hearingEyes: Pupils equal round and reactive to light and accomodation, extraoccular motions intact  Neck: Bivona trach in place, connected to vent. Hard ulcerated tissue surrounds base of trach.  CV: Regular rate and rhythm. Lungs: Non-labored breathing, on the vent; clear to ascultation bilaterally. Abdomen: Soft, non-distended, non-tender. Gastrostomy tube present. GU: Foley. Extremities/MSK: Warm, well perfused. No splints or casts.Skin: No rashes, ecchymoses, erythemaVascular: 2+ pulses throughout, bilaterally and symmetrical. +:+:+:+:+:+:+:+:+:+:+:+:+:+:+:+:+:+:+:+:+:+:+:+:+:+:+:+:+:+:+:+:+:+:+:+:+:+:+:+Labs Last 24 hours:Most Recent Result Component Value Date/Time  Sodium 136 10/15/2020 06:30 AM  Potassium 4.7 10/15/2020 06:30 AM  Chloride 98 10/15/2020 06:30 AM  CO2 28 10/15/2020 06:30 AM  BUN 34 (H) 10/15/2020 06:30 AM  Creatinine 1.96 (H) 10/15/2020 06:30 AM  WBC 8.3 10/15/2020 06:30 AM  Hemoglobin 7.6 (L) 10/15/2020 06:30 AM  Hematocrit 24.00 (L) 10/15/2020 06:30 AM  Platelets 40 (L) 10/15/2020 06:30 AM  INR 1.02 10/15/2020 06:30 AM  PTT 23.1 10/15/2020 06:30 AM +:+:+:+:+:+:+:+:+:+:+:+:+:+:+:+:+:+:+:+:+:+:+:+:+:+:+:+:+:+:+:+:+:+:+:+:+:+:+:+Imaging Last 24 hours:XR Chest PA or APResult Date: 2/10/2022No acute abnormality. Reported And Signed By: Madie Reno, MD  [facility] Radiology and Biomedical ImagingXR Chest PA or APResult Date: 2/9/2022Mild interstitial edema. Consolidation at the right lung base which may be on the basis of atelectasis or pneumonia. Reported And Signed By: Glenna Durand, MD  [facility] Radiology and Biomedical Imaging+:+:+:+:+:+:+:+:+:+:+:+:+:+:+:+:+:+:+:+:+:+:+:+:+:+:+:+:+:+:+:+:+:+:+:+:+:+:+:+This patient remains critically ill in the SICU as indicated by the following life-threatening conditions and associated critical interventions. My plan, by active ongoing problem, is to: # metastatic laryngeal cancer: we are coordinating care with ENT and hematology and medical oncology and radiation oncology; my  understanding is that his cancer is not curable, and he is a non-operative candidate; team planning for palliative radiation therapy (not a candidate for chemo) -- may therefore be beneficial to get palliative care involved for optimizing his quality of life; staging [place] today# acute respiratory failure requiring mechanical ventilation (hypoxemia): we are adjusting the ventilator settings to optimize respiratory function; we are aggressively suctioning patient, given heavy secretion burden and mucous plugging; today, peep 5 and FiO2 30%; scoped by ENT today and airway patent# DVT: of IJ on right; anticoagulation with heparin drip -- with known ITP and thrombocytopenia -- close monitoring; coordinating care with hematology# anemia chronic disease: actively monitoring hemoglobins, and determined today that no acute indication for transfusion# acute kidney injury (non-traumatic acute renal failure): we continue supportive care, actively maintaining blood pressure and avoiding nephrotoxins given poor clearance; nephrology on board# severe protein-calorie malnutrition: related to age, comorbidities, and hypermetabolism associated with acute on chronic illness; tube feeds via PEG tube# hyperglycemia / DM: coordinating care with endocrinology# chronic medical conditions: hypertension, hyperlipidemia, Diabetes, Alcohol use, ITP and Waldenstroms macroglobulinemiaThe patient has required a total of 55 minutes of my undivided attention (exclusive of procedures & education) in the past 24 hours for their previously noted life threatening diagnoses, and remains at risk for life-threatening decompensation. We will continue with ongoing critical interventions, including medical monitoring, blood pressure and heart rate management, neurologic monitoring, metabolic management, review and interpretation of labs and imaging, and coordination of care.Barbette Hair. Tora Duck, MD MSSICU Attending2/06/2021; 3pm(note signed 2.11.2022)+:+:+:+:+:+:+:+:+:+:+:+:+:+:+:+:+:+:+:+:+:+:+:+:+:+:+:+:+:+:+:+:+:+:+:+:+:+:+:+

## 2020-10-16 NOTE — Other
VANCOMYCIN LEVEL EVALUATIONCurrent Vancomycin Order: 2 g IV per laboratory/placeholder data. Day of Therapy: 7Vancomycin Indication: Suspected MRSA infectionEstimated Creatinine Clearance: 42 mL/min (A) (by C-G formula based on SCr of 2.29 mg/dL (H)).Creatinine (mg/dL) Date Value 40/98/1191 2.29 (H) 10/15/2020 1.96 (H) 10/14/2020 1.44 (H)  Renal Function: AKILevel: Lab Results Last 72 Hours Component Value Date/Time  Vancomycin Random 25.2 (H) 10/15/2020 11:52 PM  Vancomycin Trough 14.5 10/13/2020 09:00 PM Type of Level: Random (24 hours post-dose)Based on vancomycin level obtained, recommend:  Hold dose until level appropriateRepeat Level:  2/11 at 2300ID/AST Consulted? No [facility] Vancomycin Dosing GuidelineBH Vancomycin Dosing GuidelineFor questions, please contact the pharmacist: Smiley Houseman, PharmD    Phone/Mobile Heartbeat: MHB

## 2020-10-16 NOTE — Progress Notes
[facility]	SICU Progress NoteAttending Provider: Alric Quan, MDPost-Operative Day/Post Injury Day Procedure(s):2/5: [name] exchange (ENT) Hospital LOS: 7 days Interim History:  HPI: 55 y.o. male admitted to the SICU on 2/9 for ventilator management following an RRT on the oncology floor for desaturation and increased work of breathing while on trach mask and ultimately placed back on vent. ?Patient was initially a transfer from [facility] on 2/4 after trach and PEG when he had a partial obstruction of trach site from a new necrotic mass. He underwent trach exchange 2/5. Was on trach mask on oncology floor however had 2 episodes on floor requiring RRT for lavage and likely mucous plugging. ?Course Complicated by: - Incidental L IJ thrombus - Multiple episodes of presumed mucous plugging requiring RRT's ?PMH: Chronic ITP, Waldenstrom macroglobulinemia, HTN. HLD, DM, tobacco and alcohol use, Laryngeal SCC (weekly Cisplatin and RT)?PSH: None known ?Home meds: Lipitor, Pepcid, Lisinopril, Metformin, Oxycodone, miralax, Insulin U100 and Lantus ?SICU course:2/9: Arrives to SICU HDS, on vent PCV. Appears well. Transitioned to Pressure support on vent and tolerating well. O/N: Episode of patient feeling like not getting a full breath, extremely tachypenic and increased work of breathing and tachycardia, attempted suctioning and lavage without improvement, attempted to switch to PCV and VAC but due to dyssynchrony unable to tolerate, no desat but poor ventilation and TV and ETCO2 up to 100. ENT at bedside do not feel additional scope would benefit as he was just scoped a few hours ago, ultimately gave fentanyl push and started propofol for vent synchrony but despite max prop still dyssynchronous. Gave one dose of roccuronium once sedated for vent synchrony with good effect. Albuterol and HTS neb given and peaks improved from 50 to 30's. No further paralytic needed and now synchronous on VAC. Kept on propofol for vent synchrony. CXR reassuring and back to GCS 11T. Hypotension only following sedation and Rocc, Low dose Neo started. 2/10: Kept on higher sedation in AM for [place] A/P (for staging) and soft tissue neck. Weaning sedation and will plan for PSV trial once more awake. Scoped by ENT in AM, airway patent. Q1-2hr suction requirements of thick tan secretions. No further episodes of respiratory distress although sedation remains high. Tube feeds changed to continuous given significant hyperglycemia. Insulin regimen changed per diabetes team recommendations. Pressure ulceration noted around tracheostomy --> ENT at bedside to evaluation, likely some component of invasive tumor growth. Palliative care consult for assist with symptom management and family moving forward. Primary oncologist office made aware of referral. O/N: weaning down on Prop, no episodes of respiratory distress or desats overnight. Febrile to 101.3, sputum and blood cultures ordered. 500cc LR bolus given for some oliguria and rising Cr. 2/11: Given 500cc NS for oliguria with good response. Vanco level yesterday 25, likely cause of worsening aki, on hold. UOP & lytes remains stable. Insulin adjusted per diabetes team. PSV as tolerated (doing well). CPT added. Confirmed with ENT pulm recs for blue portex suctionaid trach- no plans to switch at this time. Bowel regimen added. Plan keep on neo if low dose but if inc, consider changing to levo as likely then not in setting of sedation. Family updated on phone. Confirmed w heme- no AC rec for R IJ thrombus until plt > 50. Was going to attempt to place pt on TM overnight as he did very well on PSV x 3hrs, however, pt had anxiety attack regarding his cancer and prognosis, he requested to sleep during night and was agreeable to TM in  am. Review of Allergies/Meds/Hx: I have reviewed the patient's: allergies, past medical history, past surgical history, family history, social history, prior to admission medicationsObjective: Vitals:I have reviewed the patient's current vital signs as documented in the EMRLast 24 hours: Temp:  [98.78 ?F (37.1 ?C)-101.3 ?F (38.5 ?C)] 101.3 ?F (38.5 ?C)Pulse:  [66-109] 93Resp:  [0-21] 16BP: (97-122)/(48-63) 101/54SpO2:  [98 %-100 %] 98 %O2 Sat and Device used: SpO2: 98 %Device (Oxygen Therapy): mechanical ventilatorI/O's:I have reviewed the patient's current I&O's as documented in the EMR.Gross Totals (Last 24 hours) at 10/16/2020 0419Last data filed at 10/16/2020 0400Intake 3562.62 ml Output 1180 ml Net 2382.62 ml Physical ExamConstitutional:     General: He is not in acute distress.   Appearance: He is not ill-appearing or toxic-appearing. HENT:    Head: Normocephalic and atraumatic.    Right Ear: External ear normal.    Left Ear: External ear normal.    Nose: No congestion or rhinorrhea.    Mouth/Throat:    Mouth: Mucous membranes are moist. Eyes:    Extraocular Movements: Extraocular movements intact.    Pupils: Pupils are equal, round, and reactive to light. Neck:    Comments: Tracheostomy in place Cardiovascular:    Rate and Rhythm: Regular rhythm. Tachycardia present.    Heart sounds: No murmur heard. Pulmonary:    Breath sounds: No wheezing, rhonchi or rales.    Comments: Slight tachypnea Abdominal:    General: There is no distension.    Tenderness: There is no abdominal tenderness.    Comments: PEG tube in place  Genitourinary:   Comments: Foley in place Musculoskeletal:    Cervical back: Normal range of motion.    Right lower leg: No edema.    Left lower leg: No edema. Skin:   General: Skin is warm and dry.    Capillary Refill: Capillary refill takes less than 2 seconds. Neurological:    General: No focal deficit present.    Mental Status: He is alert.    Cranial Nerves: No cranial nerve deficit.    Motor: No weakness. Labs: Refer to EMR. I have reviewed the patient's labs within the last 24 hours; see assessment and plan below for significant abnormals.Microbiology:Refer to EMR. I have reviewed all new results within the last 24 hours; see assessment and plan below.Diagnostics:Refer to EMR. I have reviewed all new results within the last 24 hours; see assessment and plan below.Chest ZO:XWRUE to EMR. I have reviewed all new results within the last 24 hours; see assessment and plan below.Abdominal AV:WUJWJ to EMR. I have reviewed all new results within the last 24 hours; see assessment and plan below.ECG/Tele Events: I have reviewed the patient's ECG and telemetry as resulted in the EMR.Assessment/Plan Neurologic: Analgesia/ Anxiolysis - Precedex gtt, RASS goal 0 to -1- Tylenol 650 Q6 PRN - Oxy 5/10 SS PRN - Ativan 0.5mg  Q6 prn for anxiety - Trazodone 25mg  Qhs PRN ?ENT: L Laryngeal SCC, invading tracheostomy stoma: s/p trach exchange on 10/10/20 and biopsy on 10/12/20- [place] of the neck was performed on 02/05 which showed a laryngeal mass with stromal invasion and involvement of the thyroid gland, to trachea, hypopharynx, and proximal cervical esophagus with left level 3 and level 4 lymphadenopathy- Bivona trach to bypass obstruction - 2/10 [place] soft tissue neck - similar necrotic mass to prior imaging, unchanged RIJ thrombus- F/u path from 2/7- 2/10 [place] abdomen pelvis for staging --> increased abdominal lymphadenopathy - Radiation onc following and plan for initial palliative radiation 2/15 with eventual bridge to chemo -  Onc following appreciate reccs - Will have tumor board evaluation on Monday- Palliative care consult placed ?Cardiac: Hypotension, directly related to starting sedation but given febrile, could also be septic- Neo gtt wean as able Hx of HTN/ HLD- Maintain continuous telemetry- Holding home Lisinopril- Holding home Lasix- Continue home crestor?Respiratory:  Respiratory failure in the setting of mucous plugging and tumor invasion of tracheal stoma- High secretion burden and frequent mucous plugging on floor - Trial PSV as tolerated 2/11, possibly transition to TM 2/12- Q2h suction requirements of thick tan secretions- Tracheal suctioning and lavaging as needed - Albuterol neb Q4WA- Atropine 2mg  SL Q4 for secretions - Hypertonic saline nebs Every 8 hours - CPT- Aggressive pulmonary hygiene - Interventional pulm following- Vap precautions ?GI/Nutrition: - NPO, Diabetisource @ 50cc while on propofol- PEG tube - Nutrition following- Bowel regimen?PUD prophylaxis- Pepcid ?Renal/Fluids/Lytes: AKI on CKD: Baseline Cr (1.5-1.6)Likely iso supRAtherapeutic vanco level- Renal U/S with no hydronephrosis- Foley catheter for strict I&O- Mg/K sliding scales?Infectious Disease: Possibly PNA, concern for infected necrotic mass?, fevers 2/11- Ceftriaxone 2/10-- Vanco given positive MRSA swab and concern for PNA 2/10-- S/P Zosyn - F/U sputum Cx. 2/11- F/U blood Cx. 2/11?Hematology:Acute blood loss anemia- In the setting of surgery - Consider transfusion for H&H<7/21 or hemodynamic instability?Hx of Chronic ITP, Waldenstrom macroglobulinemia-Transfuse platelets <50 if bleeding, <10 if no active bleeding- Hematology following appreciate recommendations --> recommending to hold [facility] if platelets below 50, treat ITP as appropriate with dexamethasone/IVIG (would likely not treat unless plt < 20)- Trend daily hemolysis retic panel, LDH, haptoglobin- Trend daily DIC markers: PT/PTT/INR, fibrinogen?R IJ Thrombus: incidentally found on [place]- BUE dopplers with no DVT- S/P high risk heparin gtt discontinued 2/8 given thrombocytopenia - Hematology following, holding on [facility] given thrombocytopenia. Would not start alternative form of AC either (I.e. bival, etc) until ptt > 50 and then can consider heparin high risk bleed gtt?DVT prophylaxis- Maintain SCDs at all times- No chemoprophylaxis at this time?Endocrine: Hx DM II on oral agents and insulin - Insulin Per Diabetes team - Lantus 15u QD @ noon- MDISS- Standing regular insulin 7u q6?Musculoskeletal: Generalized deconditioning- OOB daily, PT/OT evals- Turn and reposition Q2 hours, PUP dressing?Device Review:10/14/2020 - Bivona trach- Foley - PIV's - PEG- R SC port?Code Status / Dispo:- Full codeNotifications : For questions please call: SICU Covering Provider listed in [facility] / EpicElectronically Signed:Lamona Eimer Carollee Massed, APRNMHB: [phone]/07/2021

## 2020-10-16 NOTE — Plan of Care
Plan of Care Overview/ Patient Status    Drowsy, opens eyes spontaneously most of the time.  PERL. Follows commands, and communicates via mouth words and hand gestures.  Moves all four extremities 3/5.  C/o generalized weakness. C/o generalized pain, Oxycodone 10 mg given with good effect.  Discontinued Propofol and starts on Prece dex at 0.4 mcg/kg/hr.  Pt is calm and pleasant. Normal sinus on monitor, HR 80's to 90's.  Continues on Neo at 0.5 mcg/kg/min to maintains MAP>65 and SBP>90<160.  Trach care and oral care provided.  Infrequent nonproductive coughs, and frequent Resp care/treatment by RT.  Trach suction with small thin white secretions. Tolerating PSv trial of 5/5 at 30 % of FIO2 and maintaining oxygen saturation 99% to 100%.  Tumor/skin around trach site intact, red, pink and no drainage noted.  Tolerating tube feeding of Diabetisource at 50 cc/hr. Hyperglycemia, Insulin given per sliding scale.  Foley draining clear yellow urine.  Had small bowel in bedpan.  No family call for update.

## 2020-10-16 NOTE — Progress Notes
ENT ATTENDINGI have seen the patient and reviewed the resident note and agree with the examination and plan with the following additions :I have being seeing the patients since Saturday, 10/10/20, for which I first met him with a comprimised airway due to protrusion of the trachostomy tube by the tumore and secretions. For the purpose of secruring the airway, the tracheostomy eas changed to a Bivona adjustable long tube, which was insrted to its full length.After securing the airway, an oncologist assessment has been taken including a new [place] scan of neck was done demonstrating Large transspatial necrotic laryngeal mass with stomal invasion and involvement of the thyroid gland, trachea, hypopharynx and proximal cervical esophagus as above. Associated necrotic left level 3 and 4 lymphadenopathy as above. On my review, I believe there is involvement of the prevertebral fascia and suspicious foci of bone erosion in one of the thoracic vertebras. On Monday, 2/7, a biopsy was taken from the protruding part of the tumor around the stoma to allow for genetic and molecular profiling of the tumor.The patient was refereed to Med and Rad Oncology. in the question of possible neo-adjuvant treatment vs. Palliative care and would be brought to the multi disciplinary tumor board this coming Monday.Nile Dear, MD FellowHead and Neck Cancer and Reconstructive SurgeryYale School of Medicine and [facility].  [phone]   Fax. [phone]

## 2020-10-16 NOTE — Progress Notes
Hematology Follow-up Note      Current Presentation:       CONSULT DIAGNOSIS: RIJ thrombus, WM, laryngeal SCC, ITP    INTERIM HISTORY:  - seen bedside before taken to radiation  - cannot speak but can nod yes/no and use phone for text to speech communication  - aware of GOC meeting today, plan is for palliative radiation, no longer option for curative systemic therapy  - has TAART/RRT today and yesterday for hypoxic event s/p aggressive suctioning    Medications:  Scheduled Meds:  Current Facility-Administered Medications   Medication Dose Route Frequency Provider Last Rate Last Admin   ? albuterol neb sol 2.5 mg/3 mL (0.083%) (PROVENTIL,VENTOLIN)  2.5 mg Nebulization Q4H WA Leafy Half, MD       ? famotidine (PEPCID) tablet 20 mg  20 mg Per G Tube BID Jeanmarie Plant, MD   20 mg at 10/14/20 1050   ? [Held by provider] furosemide (LASIX) tablet 20 mg  20 mg Per G Tube Daily Jeanmarie Plant, MD   20 mg at 10/11/20 0920   ? insulin lispro (Admelog, HumaLOG) 100 unit/mL injection 14 Units  14 Units Subcutaneous Daily Yevonne Aline, [place]   09 Units at 10/14/20 1607   ? insulin lispro (Admelog, HumaLOG) 100 unit/mL injection 14 Units  14 Units Subcutaneous Daily Schumitz, [place], [place]       ? insulin lispro (Admelog, HumaLOG) 100 unit/mL injection 8 Units  8 Units Subcutaneous Daily Yevonne Aline, [place]   8 Units at 10/14/20 1100   ? insulin lispro (Admelog, HumaLOG) Sliding Scale (See admin instructions for dose)   Subcutaneous Daily Abdalla, [place], MD       ? insulin lispro (Admelog, HumaLOG) Sliding Scale (See admin instructions for dose)   Subcutaneous 3 times per day Ria Clock, MD   4 Units at 10/14/20 1607   ? insulin NPH human (HumuLIN N,NovoLIN) injection 28 Units  28 Units Subcutaneous Nightly Schumitz, [place], [place]       ? Prosource no carb 15 gram protein and 60 kcal  1 packet Per G Tube BID Rutha Bouchard, MD   1 packet at 10/14/20 1059   ? rosuvastatin (CRESTOR) tablet 10 mg  10 mg Per G Tube Daily Jeanmarie Plant, MD   10 mg at 10/14/20 1050   ? sodium chloride 0.9 % flush 3 mL  3 mL IV Push Q8H Jeanmarie Plant, MD   3 mL at 10/12/20 8119   ? sodium chloride 3 % nebulizer solution 4 mL  4 mL Nebulization Q8H Leafy Half, MD       ? vancomycin [facility]) 2 g in sodium chloride 0.9% 500 mL IVPB  2 g Intravenous Q24H Rutha Bouchard, MD 250 mL/hr at 10/13/20 2107 2 g at 10/13/20 2107     Continuous Infusions:  ? [Held by provider] heparin 25,000 units/250 ml infusion - High Risk of Bleeding 12 Units/kg/hr (10/13/20 0753)   ? [START ON 10/15/2020] sodium chloride       PRN Meds:.acetaminophen, albuterol, dextrose (GLUCOSE) 40 % gel 15 g **OR** fruit juice **OR** skim milk, dextrose (GLUCOSE) 40 % gel 30 g **OR** fruit juice, dextrose 50% in water (D50W), dextrose 50% in water (D50W), glucagon, LORazepam, oxyCODONE, oxyCODONE, oxymetazoline, sodium chloride, traZODone    Physical Exam:       Vitals:   Vitals:    10/14/20 1900   BP: (!) 140/101   Pulse: (!) 112  Resp: 20   Temp:      GEN: WD/WN, no acute distress  HEENT: trach connected to vent, masked  RESP: normal respiratory effort; lungs clear to auscultation; and percussion.  CV: regular rate and rhythm, normal S1 and S2, no murmurs; no peripheral edema.  ABDOMEN: soft, NT/ND, no palpable masses  MUSCULOSKELETAL: no clubbing or cyanosis, able to move all extremities  SKIN: no rash or petechiae.  PSYCH: judgement and insight normal; no depression or anxiety.  NEURO: no focal motor deficits.       Data:       Labs:  Results in Past 7 Days  Result Component Current Result   Hematocrit 24.10 (L) (10/14/2020)   Hemoglobin 7.7 (L) (10/14/2020)   MCH 27.9 (10/14/2020)   MCHC 32.0 (10/14/2020)   MCV 87.3 (10/14/2020)   MPV 12.5 (H) (10/14/2020)   Platelets 37 (L) (10/14/2020)   RBC 2.76 (L) (10/14/2020)   WBC 7.4 (10/14/2020)       Chemistry    Lab Results   Component Value Date    NA 137 10/14/2020    K 4.1 10/14/2020    CL 100 10/14/2020    CO2 27 10/14/2020    BUN 26 (H) 10/14/2020    CREATININE 1.44 (H) 10/14/2020    GLU 238 (H) 10/14/2020    Lab Results   Component Value Date    CALCIUM 9.2 10/14/2020    ALKPHOS 83 10/06/2020    AST 12 10/06/2020    ALT 21 10/06/2020    BILITOT 0.2 10/06/2020        Component 10/13/20 ?4:24 AM   Blood Smear Interpretation Normocytic normochromic hypoproliferative (RPI <2) anemia with occasional ovalocytes. No smear evidence of hemolysis. Leukocytes with left shift and neutrophils with scattered toxic granulation suggestive of stress response. Lymphopenia with some reactive forms. Thrombocytopenia with low IPF suggestive of hypoproliferative bone marrow.        IMAGING:  XR Chest PA or AP    Result Date: 10/14/2020  Mild interstitial edema. Consolidation at the right lung base which may be on the basis of atelectasis or pneumonia. Reported And Signed By: Glenna Durand, MD  [facility] Radiology and Biomedical Imaging    [place] Soft Tissue Neck w IV Contrast    Result Date: 10/10/2020  Large transspatial necrotic laryngeal mass with stomal invasion and involvement of the thyroid gland, trachea, hypopharynx and proximal cervical esophagus as above. Associated necrotic left level 3 and 4 lymphadenopathy as above. 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 Please see concurrently [place] chest examination for additional details regarding the chest. Report Initiated By:  Loel Lofty, MD Reported And Signed By: Remer Macho, MD  [facility] Radiology and Biomedical Imaging    [place] Chest w IV Contrast (eg: Empyema, Complex pleural and mediastinal masses, staging of central lung cancer)    Result Date: 10/10/2020  Partially visualized soft tissue neck mass which is also characterized on the concurrent neck [place]. There is mediastinal adenopathy and circumferential thickening of the trachea concerning for metastatic disease. Bilateral dependent consolidations likely represent aspiration. Pulmonary nodules measuring up to 6 mm which are nonspecific but should be followed on future imaging per oncology protocol to exclude metastatic disease. This study was reviewed by Dr. Leilani Merl. If you have any questions or concerns, call or text [phone] to speak to the radiologist. Reported And Signed By: Leilani Merl, MD  [facility] Radiology and Biomedical Imaging    US Renal    Result Date: 10/12/2020  No hydronephrosis. Reported And Signed By: Darrell Jewel, MD  [facility] Radiology and Biomedical Imaging    US Duplex Lower Extremity Venous Bilat    Result Date: 10/12/2020  No evidence of deep venous thrombosis of the bilateral lower extremities. Ultrasound is only of moderate sensitivity for the diagnosis of deep vein thrombosis of the calf. If deep vein thrombosis of the calf is of clinical concern, followup ultrasound examination in 4-7 days would be of value to exclude propagation of clot from the calf. Reported And Signed By: Dulce Sellar, MD  [facility] Radiology and Biomedical Imaging    US Duplex Upper Extremity Venous Bilateral    Result Date: 10/12/2020  No evidence of deep venous thrombosis within the bilateral upper limb/neck deep venous system. Superficial venous thrombosis within the right basilic cubital vein at the antecubital fossa. Reported And Signed By: Darrell Jewel, MD  [facility] Radiology and Biomedical Imaging         Assessment and Recommendations:         Mr. 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 He is currently trach and PEG. He was noted ot have necrotic mass surrounding trach stoma at OSH and incidentally found to have R IJ thrombus by [place], which was likely provoked by R chest port/line. However, upper extremity ultrasound did not note R IJ thrombus but rather noted superficial venous thrombosis. He was initially empirically treated for DVT with high risk heparin gtts on 10/10/20, however he notably had down-trending thrombocytopenia and AC has been held since 10/13/20. At this point, ITP has been invoked given his prior history although normally a diagnosis of exclusion. Other possibilities include infection or inflammation or critical illness related myelosuppression or chemo related cytopenia and bone marrow suppression, however last dose was C1D15, 09/28/20. There has been no evidence of hemolysis per labs or by smear.     If thrombocytopenia worsens or is limiting future therapies or patient is having clinically significant bleeding, we may recommend ITP-directed therapy with dexamethasone 40 mg daily x 4 days with no subsequent taper. IVIG can quickly increase the platelet count and thus remains an option for patients actively bleeding or have an impending invasive procedure or patients who are steroid-intolerant.      Plan:   - trend daily hemolysis retic panel, LDH, haptoglobin  - trend daily DIC markers: PT/PTT/INR, fibrinogen  - do not give systemic anticoagulation if PLT < 50k    Patient seen and case discussed with attending Dr. Talitha Givens whose addendum will follow.      Warnell Forester, MD MPH  Clinical Fellow   Hematology/Oncology  [facility]  [facility]: [phone]  10/14/2020  7:10 PM    ATTENDING ADDENDUM:   I saw and evaluated Mr. Zaivion Kundrat on 10/14/20 along with the fellow Dr. Warnell Forester, personally performed a history and physical examination, and reviewed the management for the patient in detail with the fellow. I agree with the history, physical exam findings, assessment, and plan of care as documented above in Dr. Dessie Coma note, which I have reviewed, with the following additions:    Mr. Tsang is a 55 yo gentleman followed by Dr. 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 He is s/p trach and PEG and also has a history of CKD stage 3. He was admitted initially to [facility] due to SOB and then transferred to [facility] on the ENT service for trach management. He was recently noted to have a necrotic mass surrounding his trach stoma but could not get imaging as an outpatient due to inability to lie flat, on admission here he had a [place] soft tissue neck on 10/10/20 which showed a large necrotic laryngeal mass (see above with stomal invasion, involvement of thyroid gland, trachea, hypopharynx, and proximal cervical esophagus with associated necrotic L level 3 and 4 LAD) and incidentally found a right IJ thrombus for which our Hematology consult service was involved. Note is made that Mr. Paredez has a R chest wall port so this would be considered a line-associated thrombus and provoking factors include central catheter as well as active malignancy. He has no personal hx of arterial or venous thrombosis. We recommended a RUE [name] to confirm this thrombosis, though unfortunately it looks like the right IJ was obscured by bandages on this imaging. We also discussed with the team that discussion should be held with ENT and rad onc given the large necrotic mass.LAS to determine if [facility] was safe/appopriate and if so recommended heparin gtt high risk bleeding protocol which Mr. Dennard was started on without significant bleeding though this is now on hold given drop in his platelet count. He was noted to have a mild worsening normocytic anemia thought to be from chemotherapy and thrombocytopenia at time of our initial visit was mild and similar to prior, chart hx of ITP previously treated with steroids and IVIG was noted, though IPF low which spoke against a consumptive/destructive process such as ITP. 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 PBS shwoed normocytic normochromic hypoproliferative anemai with occ ovalocytes but no evidence of hemolysis. Leukocytosis were left shifted and PMNs with scattered toxic granulation suggestive of stress response. Thrombocytopenia with low IPF said to be suggestive of hypoproliferative bone marrow. Coags have been normal with no evidence of DIC.     Interim events noted as described by Dr. Marshell Garfinkel above including TAART/RRT for hypoxic events s/p aggressive suctioning. He is off heparin gtt currently given platelet count as above and it seems will be transitioning to palliative RT here at [facility] after GOC discussion/meeting with patient and family. Labs reviewed as above, platelet count today is 37K. There is no evidence of thrombotic microangiopathy or DIC. Mr. Casale has a hx of ITP and while generally we would expect to see an elevated immature platelet fraction, I spoke with Dr. Valla Leaver today that if his platelet count is thought to be prohibitory for recommended cancer treatment and/or if continues to drop and unable to anticoagulate for R IJ thrombus, could consider a course of pulse dex 40 mg daily x 4 for presumed ITP treatment. Would likely hold on IVIG at this time given potential thrombotic risk in setting of acute R IJ DVT in the absence of AC currently. If platelet count improves to >50K, would restart unfractionated heparin gtt using high risk bleeding protocol and transition could be made to lovenox 1 mg/kg BID when felt appropriate. While provoked DVTs are usually managed with 3-6 months of anticoagulation, given Mr. Eberlein right-sided central catheter and active malignancy may want to consider  longer-term AC pending his clinical course. Please send vitamin B12, MMA, folate, copper for further evaluation of anemia. Remainder of plan as documented above.     The above was discussed with the patient and with Dr. Valla Leaver from Medical Oncology.     Hematology team will sign off at this time but we remain available for any new questions or concerns that may arise.     Talitha Givens, MD  Attending, Hematology  [facility]  [facility]: [phone]  Cell: [phone]

## 2020-10-16 NOTE — Other
Wound consult placed for neck wound. Per covering RN Nettie Elm this is not a wound but a tumor grown next to thew trach site. Please reach out to otolaryngology for recommendation.  Wound consult to sign off. Arleta Creek, RN WONMHB# [phone]/11/20222:09 PM

## 2020-10-16 NOTE — Plan of Care
Plan of Care Overview/ Patient Status    Pt is sedated on propofol gtt but easily arousable. Opening eyes spontaneously/ to speech. PERRL. BUE 3/5 and BLE 2/5. Following commands. HR 70's-80's. Neo gtt titrated to maintain MAP >65. Right single lumen port intact; dressing changed. PIV x2. AM labs drawn; no repletions necessary. Tmax 101.3 per bladder probe. PRN tylenol given + Iced packs applied. Sputum and one set of blood cultures sent. #8 [place] intact. Trach care done. Pt remains on ventilator, AC Volume 16/450/5/30%. NPO. Diabetisource infusing throuh PEG at 50 mL/hr. Small incontinent BM x1. Foley intact. Oliguric during 1-2 hours. 500 cc LR bolus given. Please see skin flowsheet for skin details.See doc flowsheets, I&O, and MAR for more info. Electronically Signed by Whitney Post, RN, October 16, 2020

## 2020-10-16 NOTE — Other
I updated niece and sister via phone at length today, including a phone call in pts room where he was involved in conversation with niece. Patient requested I write a note documenting that he floridly refuses to go to [facility] for rehab. This conversation was prompted by him asking me plans after he leaves ICU and hospital, to which I responded that he would likely need to go to rehab for some time. He became extremely tearful, reached for my hand and begged me not to send him back to [facility]. I informed him I unfortunately do not coordinate his discharge but that yes, I can write a note per his request stating he refuses to return there and make sure his healthcare team is aware of this to the best of my ability.Jacqlyn Larsen, APRNMHB: [phone]

## 2020-10-16 NOTE — Progress Notes
Inpatient Diabetes Management Team Follow-up Note    Over the past 24 hours, the patient's glucose control has been above goal    Current Nutritional Status:  Continuous tube feeding with Diabetisource at 50 mL/hr   - Total provides: 1800 kcal (2774 kcal w/current propofol rate), 162 gm protein, 120 gm CHO (5 gm/hr), and 979 mL free H2O    ? Current Anti-hyperglycemic Regimen:  Glargine 10 units daily at 6PM, regular insulin 5 units q6 hours, regular MID dose sliding scale q5 hours    ? Home Anti-hyperglycemic Regimen: Toujeo U300 64 units daily, Apidra 20 units TID with meals, metformin 1000 mg bid     ? Other Contributing Medications:   ? Fentanyl infusion  ? Phenylephrine infusion  ? Propofol infusion     PE:    Patient laying in bed in NAD on the ventilator  Temp:  [98.78 ?F (37.1 ?C)-101.3 ?F (38.5 ?C)] 100.4 ?F (38 ?C)  Pulse:  [66-99] 92  Resp:  [0-19] 9  BP: (97-122)/(48-63) 102/54  SpO2:  [98 %-100 %] 98 %  Device (Oxygen Therapy): mechanical ventilator;tracheostomy collar     Wt Readings from Last 3 Encounters:   10/16/20 (!) 137.9 kg   10/06/20 (!) 139.5 kg   09/29/20 (!) 142.4 kg       Data Review:  BGs:      Creatinine (mg/dL)   Date Value   16/06/9603 2.29 (H)     Hemoglobin A1c (%)   Date Value   10/10/2020 9.0 (H)       Assessment:  55 yo h/o type 2 diabetes, HTN, Waldenstroms macroglobulinemia, ETOH, laryngeal SCC s/p trach/PEG presented with SOB significant for tumor invading the trach stoma with partial obstruction and now plan to start palliative chemo radiation 2/15. Patient currently in the SICU for vent management. Diabetes team consulted for assistance in glycemic management.      BG over the past 24 hours have been above goal. Noted AKI. He remains on continuous TF at goal rate. Advise increase in both basal and bolus insulin. Discussed plan with the covering provider A. Carollee Massed, APRN. For this patient on continuous TF, the goal is to maintain all BGs between 140-180 mg/dl    Recommendations:   1. Increase glargine to 15 units. Please change timing to daily at noon  2. Increase regular insulin to 7 units q6 hours. HOLD if TF is held  3. Continue regular insulin mid dose sliding scale q 6 hours   4. Blood glucose monitoring q6 hours  5. Diet: tube feeds per nutrition. Please update diabetes team on any changes to TF regimen       Please let us know if you have any questions or concerns about our recommenations. We will continue to follow the patient along with you during his hospitalzation as we attempt to optimize his glycemic control.  Detailed recommendations were communicated to the primary team both verbally and/or through this written note.  I spent a total of 20 minutes with the patient of which 15 minutes (over 50%) was spent in counseling the patient regarding diabetes management and coordinating care with the primary team.     Signed:  Everlene Farrier, APRN  Inpatient Diabetes Management Team   [facility]: [phone]  Diabetes consult pager # [phone]      NOTE: During nights, weekends, and holidays, please contact the on-call Endocrine Fellow at pager [phone].

## 2020-10-16 NOTE — Progress Notes
+:+:+:+:+:+:+:+:+:+:+:+:+:+:+:+:+:+:+:+:+:+:+:+:+:+:+:+:+:+:+:+:+:+:+:+:+:+:+:+SICU Attending Note:Arthur [name], who is a critically ill patient in the [facility] SICU, was examined at the bedside; the chart and new notes were reviewed, laboratory data reviewed, events of the past 24 hours discussed, diagnostic imaging reviewed and interpreted in full including the last CXR, and discussed care and coordinated management with: our ICU APPs, primary team, consulting services, our critical care nurses, and respiratory therapy. 55 y.o. male admitted on 10/09/2020 for dyspnea, SOB, new right IJ DVT -- and found to have large necrotic hard mass surrounding the tracheostomy tube, and concern for tube invasion from this laryngeal cancer. ENT scoped and found partial obstruction, so trach changed to a Bivona, bypassing the obstruction. [place] showed extensive disease, with mets and lymphadenopathy. He was transferred to the SICU on night of 2.9.2022 due to mucous plugging and repeated RRT / TAART on the floor secondary to airway plugging (at least 1 daily for 5 days prior to SICU admission) and need for mechanical ventilator through his existing trach. Today is Hospital Day #8Events in the past 24 hours in the SICU:# oliguria# febrile# shock on pressors +:+:+:+:+:+:+:+:+:+:+:+:+:+:+:+:+:+:+:+:+:+:+:+:+:+:+:+:+:+:+:+:+:+:+:+:+:+:+:+Past Medical History: Diagnosis Date ? Diabetes mellitus (HC Code) (HC CODE)  ? Hypercholesteremia  ? Hypertension  No past surgical history on file.Scheduled Meds:Current Facility-Administered Medications Medication Dose Route Frequency Provider Last Rate Last Admin ? albuterol neb sol 2.5 mg/3 mL (0.083%) (PROVENTIL,VENTOLIN)  2.5 mg Nebulization Q4H Carollee Sires, PA   2.5 mg at 10/16/20 0415 ? atropine 1 % SUBLINGUAL solution 2 mg  2 mg Sublingual Q4H Orland Dec Aileen Pilot, MD   2 mg at 10/16/20 0345 ? cefTRIAXone (ROCEPHIN) 1 g in sodium chloride 0.9 % 10 mL (100 mg/mL)  1 g IV Push Q24H Carollee Sires, PA   1 g at 10/15/20 1003 ? chlorhexidine gluconate (PERIDEX) 0.12 % solution 15 mL  15 mL Mouth/Throat Q12H Orland Dec Aileen Pilot, MD   15 mL at 10/15/20 2100 ? famotidine (PEPCID) tablet 20 mg  20 mg Per G Tube Daily Irine Seal, MD     ? insulin glargine (Semglee,Lantus) injection 10 Units  10 Units Subcutaneous Daily Gearldine Shown, [place]   10 Units at 10/15/20 1714 ? insulin regular human (HumuLIN R, NovoLIN R) 100 unit/mL injection 5 Units  5 Units Subcutaneous Q6H Madlener, Iva, PA   5 Units at 10/16/20 0548 ? insulin regular human (HumuLIN R, NovoLIN R) Sliding Scale (See admin instructions for dose)   Subcutaneous Q6H Madlener, Heath, PA   4 Units at 10/16/20 0549 ? LORazepam (ATIVAN) 2 mg/mL injection          ? Prosource no carb 15 gram protein and 60 kcal  2 packet Per G Tube TID Gearldine Shown, PA   2 packet at 10/15/20 2100 ? rocuronium (ZEMURON) 10 mg/mL injection          ? rosuvastatin (CRESTOR) tablet 10 mg  10 mg Per G Tube Daily Jeanmarie Plant, MD   10 mg at 10/15/20 0801 ? sodium chloride 3 % nebulizer solution 4 mL  4 mL Nebulization Q4H Carollee Sires, [place]   4 mL at 10/16/20 0415 ? Vancomycin Therapy Placeholder   Intravenous placeholder Irine Seal, MD     Continuous Infusions:? fentaNYL Stopped (10/16/20 0556) ? phenylephrine 0.5 mcg/kg/min (10/16/20 0700) ? propofol (DIPRIVAN) 1,000 mg in 100 mL (10 mg/mL) 15 mcg/kg/min (10/16/20 0700) PRN Meds: acetaminophen, albuterol, dextrose 50% in water (D50W), dextrose 50% in water (D50W), fentaNYL, glucagon, LORazepam, oxyCODONE **OR** oxyCODONE, oxymetazoline, traZODone +:+:+:+:+:+:+:+:+:+:+:+:+:+:+:+:+:+:+:+:+:+:+:+:+:+:+:+:+:+:+:+:+:+:+:+:+:+:+:+Physical  ExamVITALS:Temp (24hrs), Avg:99.7 ?F (37.6 ?C), Min:98.78 ?F (37.1 ?C), Max:101.3 ?F (38.5 ?C)Vitals:  10/16/20 0417 10/16/20 0500 10/16/20 0600 10/16/20 0700 BP:   (!) 109/58 (!) 113/59 Pulse: (!) 93 (!) 99 87 (!) 94 Resp: 16 16 (!) 10 (!) 0 Temp:  (!) 101.12 ?F (38.4 ?C) (!) 100.76 ?F (38.2 ?C) (!) 100.76 ?F (38.2 ?C) TempSrc:  Bladder Bladder Bladder SpO2: 98% 98% 99% 99% Weight:     Height:     Intake/Output Summary (Last 24 hours) at 10/16/2020 0720Last data filed at 10/16/2020 0700Gross per 24 hour Intake 3843.7 ml Output 1295 ml Net 2548.7 ml Vitals: BP (!) 113/59  - Pulse (!) 94  - Temp (!) 100.76 ?F (38.2 ?C) (Bladder)  - Resp (!) 0  - Ht 6' 2 (1.88 m)  - Wt 131.3 kg  - SpO2 99%  - BMI 37.16 kg/m? I examined them at the bedside today at 8 AMGeneral: Intubated. Calm and cooperativeNeuro: Following commands, GCS 11T Psych: Unable to assess.Head: Normocephalic, nontraumaticEars: Unable to assess hearingEyes: Pupils equal round and reactive to light and accomodation, extraoccular motions intact  Neck: Bivona trach in place, connected to vent. Hard ulcerated tissue surrounds base of trach.  CV: Regular rate and rhythm. Lungs: Non-labored breathing, on the vent; clear to ascultation bilaterally. Abdomen: Soft, non-distended, non-tender. Gastrostomy tube present. GU: Foley. Extremities/MSK: Warm, well perfused. No splints or casts.Skin: No rashes, ecchymoses, erythemaVascular: 2+ pulses throughout, bilaterally and symmetrical. +:+:+:+:+:+:+:+:+:+:+:+:+:+:+:+:+:+:+:+:+:+:+:+:+:+:+:+:+:+:+:+:+:+:+:+:+:+:+:+Labs Last 24 hours:Most Recent Result Component Value Date/Time  Sodium 135 (L) 10/16/2020 04:36 AM  Potassium 4.5 10/16/2020 04:36 AM  Chloride 98 10/16/2020 04:36 AM  CO2 26 10/16/2020 04:36 AM  BUN 45 (H) 10/16/2020 04:36 AM  Creatinine 2.29 (H) 10/16/2020 04:36 AM  WBC 9.1 10/16/2020 04:36 AM  Hemoglobin 7.5 (L) 10/16/2020 04:36 AM  Hematocrit 23.50 (L) 10/16/2020 04:36 AM  Platelets 44 (L) 10/16/2020 04:36 AM  INR 0.98 10/16/2020 04:36 AM  PTT 21.8 (L) 10/16/2020 04:36 AM +:+:+:+:+:+:+:+:+:+:+:+:+:+:+:+:+:+:+:+:+:+:+:+:+:+:+:+:+:+:+:+:+:+:+:+:+:+:+:+Imaging Last 24 hours: Soft Tissue Neck w IV ContrastResult Date: 2/10/2022Tracheostomy tube in place with tip terminating approximately 4 cm above the carina. Similar large necrotic transposition on mass with invasion into the thyroid gland, trachea, hypopharynx, thyroid cartilage and cervical esophagus with extensive cervical mediastinal and supraclavicular lymphadenopathy. Similar partially occluding thrombus in the right internal jugular vein. Report Initiated By:  Kara Mead, MD Reported And Signed By: Forrestine Him, MD  [facility] and Biomedical ImagingCT Abdomen Pelvis w IV & Oral Contrast (Eval for metastatic disease TCC)Result Date: 2/10/2022New borderline retroperitoneal lymphadenopathy. Reported And Signed By: Lupita Leash, MD  [facility] Radiology and Biomedical Imaging+:+:+:+:+:+:+:+:+:+:+:+:+:+:+:+:+:+:+:+:+:+:+:+:+:+:+:+:+:+:+:+:+:+:+:+:+:+:+:+This patient remains critically ill in the SICU as indicated by the following life-threatening conditions and associated critical interventions. My plan, by active ongoing problem, is to: # metastatic laryngeal cancer: we are coordinating care with ENT and hematology and medical oncology and radiation oncology and palliative care; per these services, his cancer is not curable, he is a non-operative candidate; team planning for palliative radiation therapy (not a candidate for chemo); palliative care involved for optimizing his quality of life# acute respiratory failure requiring mechanical ventilation (hypoxemia): we are adjusting the ventilator settings to optimize respiratory function; we are aggressively suctioning patient as needed, given heavy secretion burden and mucous plugging; scoped by ENT yesterday and airway patent, trach distal to mass; we will initiative spontaneous breathing trial today and possibly trial off of the vent# septic shock with vasopressor requirement: with fever, tachycardia, source lungs; we are titrating the vasopressors (  neo) to support hemodynamic failure and optimize oxygen delivery; wean as possible; continue antibiotics, cultures pending# DVT: of IJ on right; anticoagulation with heparin on hold -- with known ITP and thrombocytopenia; coordinating care with hematology re anticoagulation plan (alternative to heparin gtt)# anemia chronic disease: actively monitoring hemoglobins, and determined today that no acute indication for transfusion# acute kidney injury (non-traumatic acute renal failure): we continue supportive care, actively maintaining blood pressure and avoiding nephrotoxins given poor clearance; nephrology on board# severe protein-calorie malnutrition: related to age, comorbidities, and hypermetabolism associated with acute on chronic illness; tube feeds via PEG tube# hyperglycemia / DM: coordinating care with endocrinology# chronic medical conditions: hypertension; hyperlipidemia; diabetes; alcohol use; ITP and Waldenstroms macroglobulinemiaThe patient has required a total of 45 minutes of my undivided attention (exclusive of procedures & education) in the past 24 hours for their previously noted life threatening diagnoses, and remains at risk for life-threatening decompensation. We will continue with ongoing critical interventions, including medical monitoring, blood pressure and heart rate management, neurologic monitoring, metabolic management, review and interpretation of labs and imaging, and coordination of care.Barbette Hair. Tora Duck, MD MSSICU Attending2/07/2021; 4:31 PM +:+:+:+:+:+:+:+:+:+:+:+:+:+:+:+:+:+:+:+:+:+:+:+:+:+:+:+:+:+:+:+:+:+:+:+:+:+:+:+

## 2020-10-16 NOTE — Other
[name]    [facility]     Palliative Care Follow-Up Note    Present History   Chart reviewed and case discussed with SICU team as well as ENT. Patient was seen sitting up in bed this morning. Arouses easily to voice and is able to answer yes/no questions, though quickly returns to sleep. Denies pain. States he is comfortable. I provided a brief introduction to palliative care, describing our team's role providing support as patients and their families cope with difficult illness.    I subsequently received a phone call from patient's sister [name]. [name] described her and niece Arthur [name]'s distress at not being able to visit Arthur [name] together, fearful that he will die without them having the opportunity to visit and try to speak with him about his wishes and helping resolve any of his remaining financial affairs. They would like to visit together. I explained that exemptions to the one-person visitation policy remain difficult to obtain at this time, but that I would explore this with the SICU team. [name] shares that she and Arthur [name] (her and Simran's niece) are the only of his family involved in his care. [name] and [name] had another sibling several years deceased. Ashvin has no children. Arthur [name] is expected to visit this afternoon. [name] hopes that Vinayak will be able to interact with them and let them know how he wants them to proceed if he is unable to make decisions for himself and they are called on to do so on his behalf. They would like to arrange a family meeting to better understand the big picture and what [name]'s options are going forward.     Per discussion with resident, ENT team recommends that any plans for family meeting be deferred until tumor board meets Monday. He continues to wean from vent, and ENT team is hopeful that he will be able to transfer out of SICU over the weekend. It is not felt that GOC need to be re-addressed with family more urgently. I placed a follow-up phone call to [name] this afternoon to let her know this. [name] remains distressed by visitation restrictions and vented her frustration. She shared that she has mobility issues due to multiple sclerosis and it is challenging for her to come unaccompanied to the [name]. She is also struggling to communicate with Arthur [name] and worries that he feels isolated or confused why no family have visited. I encouraged [name] to ask Arthur [name]'s nurse to facilitate a call or FaceTime between them, so that he can at least hear Arthur [name]'s voice. She would appreciate anything the SICU team can do to facilitate their communication.     Notified covering provider and nursing of Arthur [name]'s concerns for communication barriers and visitation restrictions.     Review of Allergies/Hx/Meds   Allergies  Patient has No Known Allergies.    Medical History  Past Medical History:   Diagnosis Date   ? Diabetes mellitus (HC Code) (HC CODE)    ? Hypercholesteremia    ? Hypertension        Family History  Family History   Problem Relation Age of Onset   ? Aneurysm Mother        Social History  Social History     Socioeconomic History   ? Marital status: Legally Separated     Spouse name: Not on file   ? Number of children: Not on file   ? Years of education: Not on file   ? Highest education level:  Not on file   Occupational History   ? Not on file   Tobacco Use   ? Smoking status: Former Smoker     Packs/day: 1.00     Years: 30.00     Pack years: 30.00     Quit date: 03/04/2013     Years since quitting: 7.6   Substance and Sexual Activity   ? Alcohol use: Yes     Alcohol/week: 14.0 standard drinks     Types: 14 Cans of beer per week   ? Drug use: Not on file   ? Sexual activity: Not on file   Other Topics Concern   ? Not on file   Social History Narrative   ? Not on file     Social Determinants of Health     Financial Resource Strain:    ? Difficulty of Paying Living Expenses:    Food Insecurity:    ? Worried About Programme researcher, broadcasting/film/video in the Last Year:    ? Barista in the Last Year:    Transportation Needs:    ? Freight forwarder (Medical):    ? Lack of Transportation (Non-Medical):    Physical Activity:    ? Days of Exercise per Week:    ? Minutes of Exercise per Session:    Stress:    ? Feeling of Stress :    Social Connections:    ? Frequency of Communication with Friends and Family:    ? Frequency of Social Gatherings with Friends and Family:    ? Attends Religious Services:    ? Active Member of Clubs or Organizations:    ? Attends Banker Meetings:    ? Marital Status:    Intimate Partner Violence:    ? Fear of Current or Ex-Partner:    ? Emotionally Abused:    ? Physically Abused:    ? Sexually Abused:        InPatient Medications  Current Facility-Administered Medications   Medication Dose Route Frequency Last Rate   ? acetaminophen  650 mg Per G Tube Q6H PRN     ? albuterol  2.5 mg Nebulization Q6H PRN     ? albuterol  2.5 mg Nebulization Q4H     ? cefTRIAXone  1 g IV Push Q24H     ? chlorhexidine gluconate  15 mL Mouth/Throat Q12H     ? dexmedetomidine  0.2-0.7 mcg/kg/hr Intravenous Continuous 0.4 mcg/kg/hr (10/16/20 1358)   ? dextrose 50% in water (D50W)  12.5 g IV Push Q15 MIN PRN     ? dextrose 50% in water (D50W)  25 g IV Push Q15 MIN PRN     ? famotidine  20 mg Per G Tube Daily     ? HYDROmorphone  0.5 mg IV Push Q4H PRN     ? insulin glargine  15 Units Subcutaneous Daily     ? insulin regular human  7 Units Subcutaneous Q6H     ? insulin regular human   Subcutaneous Q6H     ? LORazepam         ? LORazepam  0.5 mg Per G Tube Q6H PRN     ? oxyCODONE  5 mg Per J Tube Q3H PRN      Or   ? oxyCODONE  10 mg Per J Tube Q3H PRN     ? oxymetazoline  1 spray each nostril BID PRN     ? phenylephrine  0.25-9  mcg/kg/min Intravenous Continuous 0.5 mcg/kg/min (10/16/20 1200)   ? polyethylene glycol  17 g Per G Tube BID     ? amino acids-protein hydrolys  2 packet Per G Tube TID     ? rocuronium         ? rosuvastatin  10 mg Per G Tube Daily     ? sodium chloride  4 mL Nebulization Q4H     ? traZODone  25 mg Per G Tube Nightly     ? Vancomycin MAR level   Intravenous Once     ? Vancomycin Therapy Placeholder   Intravenous placeholder         [email or web address]    Palliative Performance Scale 10% (on ventilator)  (Modification of Karnofsky and was designed for measurement of physical status in Palliative Care. Only 10% of patients with PPS score <=50% would be expected to survive >6 months)    Physical Exam  Vitals and nursing note reviewed.   Constitutional:       General: He is not in acute distress.     Appearance: He is ill-appearing.      Comments: Trached to vent   Eyes:      Pupils: Pupils are equal, round, and reactive to light.   Skin:     General: Skin is warm and dry.   Neurological:      Mental Status: He is alert.      Comments: Able to nod in response to questions         Vitals:  Temp:  [98.78 ?F (37.1 ?C)-101.3 ?F (38.5 ?C)] 100.4 ?F (38 ?C)  Pulse:  [66-99] 85  Resp:  [0-23] 12  BP: (99-124)/(48-63) 105/59  SpO2:  [98 %-100 %] 99 %     Wt Readings from Last 3 Encounters:   10/16/20 (!) 137.9 kg   10/06/20 (!) 139.5 kg   09/29/20 (!) 142.4 kg       Review of Labs/Diagnostics  Complete Blood Count:  Recent Labs   Lab 10/14/20  0558 10/15/20  0630 10/16/20  0436   WBC 7.4 8.3 9.1   HGB 7.7* 7.6* 7.5*   HCT 24.10* 24.00* 23.50*   PLT 37* 40* 44*     Comprehensive Metabolic Panel:  Recent Labs   Lab 10/14/20  0558 10/14/20  0626 10/15/20  0630 10/15/20  0917 10/16/20  0436 10/16/20  0545 10/16/20  1147   NA 137  --  136  --  135*  --   --    K 4.1  --  4.7  --  4.5  --   --    CL 100  --  98  --  98  --   --    CO2 27  --  28  --  26  --   --    BUN 26*  --  34*  --  45*  --   --    CREATININE 1.44*  --  1.96*  --  2.29*  --   --    GLU 266*   < > 290*   < > 263*   < > 285*   CALCIUM 9.2  --  8.8  --  8.3*  --   --     < > = values in this interval not displayed.     Recent Labs   Lab 10/15/20  0630   ALKPHOS 72   BILITOT 0.2   PROT 5.1*   ALT 17  AST 17       Diagnostics:  XR Chest PA or AP    Result Date: 10/15/2020  XR CHEST PA OR AP Date: 10/15/2020 12:11 AM INDICATION: acute increased work of breathing. COMPARISON: XR CHEST PA OR AP 2022-Feb-09 18:31:29.000 FINDINGS: Tracheostomy and Port-A-Cath are in a stable position. Cardiomediastinal silhouette is unremarkable. No focal consolidation, atelectasis, pulmonary edema, pleural effusion or pneumothorax is seen.     No acute abnormality. Reported And Signed By: Madie Reno, MD  [facility] Biomedical Imaging    XR Chest PA or AP    Result Date: 10/14/2020  XR CHEST PA OR AP Date: 10/14/2020 6:40 PM INDICATION: Shortness of breath. COMPARISON: None FINDINGS: Support Devices: Tracheostomy. Right chest wall therapy port tip terminates in the right atrium. Low lung volumes. Mild interstitial edema. Consolidation at the right lung base which may be on the basis of atelectasis or pneumonia. No pneumothorax. Heart is normal in size.     Mild interstitial edema. Consolidation at the right lung base which may be on the basis of atelectasis or pneumonia. Reported And Signed By: Glenna Durand, MD  [facility] and Biomedical Imaging    [place] Soft Tissue Neck w IV Contrast    Result Date: 10/15/2020  [place] SOFT TISSUE NECK W IV CONTRAST performed on 10/15/2020 1:10 PM INDICATION: assess trach positioning in relation to tumor. PMH: Chronic ITP, Waldenstrom macroglobulinemia, HTN. HLD, DM, tobacco and alcohol use, Laryngeal SCC (weekly Cisplatin and RT). Recent tracheostomy exchange after necrotic mass caused obstruction of the tracheal site at outside [name]. COMPARISON: [place] neck 10/10/2020. PET/[place] 07/01/2020. TECHNIQUE: Contiguous axial [place] images of the neck were obtained after the administration of  40  cc of Omnipaque 350 intravenously.  FINDINGS: Tracheostomy tube in place with tip terminating 4 cm above the carina. Redemonstration of large centrally necrotic ill-defined laryngeal mass with similar destructive changes of the thyroid cartilage, invasion into the thyroid gland and inferior bilateral sternocleidomastoid muscles. Anteriorly, the mass extends to the skin inferiorly with similar skin thickening and ulceration. Inferiorly, the mass is inseparable from the anterior trachea and extends into the anterior superior mediastinal compartment. Posteriorly, the mass involves the cervical esophagus and hypopharynx and abuts the cervical spine at C4-C7. No definite erosive changes of the cervical spine. Numerous abnormal centrally necrotic bilateral lymph nodes again seen. Extensive mediastinal and supraclavicular lymphadenopathy bilaterally. There is extensive swelling in the anterior lower neck/upper chest subcutaneous soft tissues. There is poor contrast opacification in the common and internal carotid arteries bilaterally, which limits evaluation. Similar crescentic filling defect in the right internal jugular vein, compatible with nonocclusive thrombus. The parotid and submandibular glands are unremarkable. Visualized orbits are unremarkable. No significant paranasal sinus disease. Mastoid air cells are clear. The visualized brain appears unremarkable. Right chest port with tip in the SVC. Nodular patchy opacities in the right upper and lower lobe again seen, which may be infectious or inflammatory in origin. A 15 x 8 mm nodular opacities along the right pleura has decreased from prior and may represent a pleural-based metastasis.     Tracheostomy tube in place with tip terminating approximately 4 cm above the carina. Similar large necrotic transposition on mass with invasion into the thyroid gland, trachea, hypopharynx, thyroid cartilage and cervical esophagus with extensive cervical mediastinal and supraclavicular lymphadenopathy. Similar partially occluding thrombus in the right internal jugular vein. Report Initiated By: Kara Mead, MD Reported And Signed By: Forrestine Him, MD  [name]  [place] Abdomen Pelvis w IV & Oral Contrast (Eval for metastatic disease TCC)    Result Date: 10/15/2020  [place] ABDOMEN PELVIS W IV CONTRAST on 10/15/2020 1:10 PM INDICATION: Head/neck cancer, staging head nad neck CA - eval for mets. COMPARISON: [facility] PET WHOLE BODY IMAGING 2021-Oct-27 TECHNIQUE: [place] images of the abdomen and pelvis were obtained after the administration of 80 cc intravenous contrast (Omnipaque 350). Technical limitations: None. FINDINGS: Lung bases: Bibasilar consolidation have not significantly changed, likely a combination of atelectasis and aspiration. Hepatobiliary: Unremarkable. Pancreas: Unremarkable. Spleen: Unremarkable. Adrenal glands: Unremarkable. Kidneys: Unremarkable. Bowel: No bowel obstruction. Gastrostomy tube is noted. Abdominal and pelvic lymph nodes: There is borderline retroperitoneal lymphadenopathy which has slightly increased. For example a 1.1 cm paraortic node on image 413 series 4 previously measured 0.8 cm. Peritoneum: No ascites. Vasculature: No abdominal aortic aneurysm. Pelvis: Foley catheter terminates in the bladder. There is a fat-containing left inguinal hernia. Musculoskeletal system and soft tissue: No aggressive osseous lesion.     New borderline retroperitoneal lymphadenopathy. Reported And Signed By: Lupita Leash, MD  [name]ical Imaging        Impression/Plan:   Arthur [name] is a 55 y.o. 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 Admitted to SICU on 2/9 for ventilator management following RRT on oncology floor for desaturation and increased work of breathing while on trach mask, ultimately placed back on vent. Referred for Palliative Care Consult by Attending Provider: Alric Quan, MD [phone] for supportive care.    Coping/Support:   - Ongoing assessments for psychosocial and/or spiritual needs  - Full interdisciplinary team support available as needed  - Appreciate primary team advocating with nurse management for an exemption to visitor restrictions to allow 2 visitors, as patient's sister reports mobility challenges making visitation on her own difficult  - Appreciate primary team and nursing helping facilitate communication between patient and his sister and niece    Advance Care Planning:  - Full code  - No advance directives or health care proxy documentation on file  - His sister and niece are listed as emergency contacts and it appears that his sister is next of kin and default proxy decision maker in the event that patient lacks capacity    Goals of Care:  - Unable to explore with patient and family at this time.  - Per chart review, it appears that GOC were most recently readdressed prior to RRT a few days ago. At that time, it was explained that curative treatment is no longer possible, and any subsequent cancer-directed therapies will be palliative in nature. Patient had confirmed that he wished to continue with chemo/radiation.   - Plan is for tumor board to review his case on 2/14, with chemo to be initiated on 2/15.     Palliative Care will continue to follow for symptom management and support. We remain available to assist with goals of care discussions as patient's condition evolves.     Thank you for involving the Palliative Care team in the care of this patient. The Palliative Care team will continue to follow.    Patient was evaluated: in person    Time committed: > 35 minutes were spent in chart review, coordination of care, and at the bedside in patient/family counseling and education, including detailed concepts of symptom managment and goals of care/advanced  care planning.    Signed:  Debbrah Alar, APRN  Palliative Care Consult Service  10/16/2020 2:35 PM

## 2020-10-17 LAB — RETICULOCYTES
BKR WAM IRF: 25.9 % — ABNORMAL HIGH (ref 3.0–15.9)
BKR WAM RETICULOCYTE - ABS (3 DEC): 0.064 10Ë6 cells/uL (ref 0.023–0.140)
BKR WAM RETICULOCYTE COUNT PCT (1 DEC): 2.4 % (ref 0.6–2.7)
BKR WAM RETICULOCYTE HGB EQUIVALENT: 32.1 pg (ref 28.2–35.7)

## 2020-10-17 LAB — BASIC METABOLIC PANEL
BKR ANION GAP: 10 (ref 7–17)
BKR BLOOD UREA NITROGEN: 57 mg/dL — ABNORMAL HIGH (ref 6–20)
BKR BUN / CREAT RATIO: 22.2 (ref 8.0–23.0)
BKR CALCIUM: 8.3 mg/dL — ABNORMAL LOW (ref 8.8–10.2)
BKR CHLORIDE: 100 mmol/L (ref 98–107)
BKR CO2: 26 mmol/L (ref 20–30)
BKR CREATININE: 2.57 mg/dL — ABNORMAL HIGH (ref 0.40–1.30)
BKR EGFR (AFR AMER): 32 mL/min/{1.73_m2} (ref >60)
BKR EGFR (NON AFRICAN AMERICAN): 26 mL/min/{1.73_m2} (ref >60)
BKR GLUCOSE: 325 mg/dL — ABNORMAL HIGH (ref 70–100)
BKR POTASSIUM: 4.5 mmol/L (ref 3.3–5.3)
BKR SODIUM: 136 mmol/L (ref 136–144)

## 2020-10-17 LAB — VANCOMYCIN, RANDOM     (BH GH LMW YH): BKR VANCOMYCIN RANDOM: 16.4 ug/mL — ABNORMAL HIGH

## 2020-10-17 LAB — MAGNESIUM: BKR MAGNESIUM: 2.2 mg/dL (ref 1.7–2.4)

## 2020-10-17 LAB — PT/INR AND PTT (BH GH L LMW YH)
BKR INR: 1.03 (ref 0.87–1.14)
BKR PARTIAL THROMBOPLASTIN TIME: 27.3 s (ref 23.0–31.4)
BKR PROTHROMBIN TIME: 11.2 s (ref 9.6–12.3)

## 2020-10-17 LAB — FIBRINOGEN     (BH GH L LMW YH): BKR FIBRINOGEN LEVEL: 719 mg/dL — ABNORMAL HIGH (ref 194–448)

## 2020-10-17 LAB — HAPTOGLOBIN: BKR HAPTOGLOBIN: 322 mg/dL — ABNORMAL HIGH (ref 30–200)

## 2020-10-17 LAB — PHOSPHORUS     (BH GH L LMW YH): BKR PHOSPHORUS: 4.2 mg/dL (ref 2.2–4.5)

## 2020-10-17 MED ORDER — INSULIN U-100 REGULAR HUMAN 100 UNIT/ML INJECTION SOLUTION
100 unit/mL | Freq: Four times a day (QID) | SUBCUTANEOUS | Status: DC
Start: 2020-10-17 — End: 2020-10-18
  Administered 2020-10-17 – 2020-10-18 (×3): 100 mL via SUBCUTANEOUS

## 2020-10-17 MED ORDER — VANCOMYCIN MAR LEVEL
Freq: Once | INTRAVENOUS | Status: CP
Start: 2020-10-17 — End: ?

## 2020-10-17 MED ORDER — INSULIN U-100 REGULAR HUMAN 100 UNIT/ML INJECTION SOLUTION
100 unit/mL | Freq: Four times a day (QID) | SUBCUTANEOUS | Status: DC
Start: 2020-10-17 — End: 2020-10-17
  Administered 2020-10-17: 16:00:00 100 mL via SUBCUTANEOUS

## 2020-10-17 MED ORDER — INSULIN U-100 REGULAR HUMAN 100 UNIT/ML (SLIDING SCALE)
100 unit/mL | Freq: Four times a day (QID) | SUBCUTANEOUS | Status: DC
Start: 2020-10-17 — End: 2020-10-27
  Administered 2020-10-17 – 2020-10-27 (×33): 100 mL via SUBCUTANEOUS

## 2020-10-17 MED ORDER — VANCOMYCIN IVPB (2 G IN 500ML NS)
Freq: Once | INTRAVENOUS | Status: CP
Start: 2020-10-17 — End: ?
  Administered 2020-10-17: 11:00:00 500.000 mL/h via INTRAVENOUS

## 2020-10-17 MED ORDER — INSULIN GLARGINE 100 UNIT/ML SUBCUTANEOUS SOLUTION
100 unit/mL | Freq: Every day | SUBCUTANEOUS | Status: DC
Start: 2020-10-17 — End: 2020-10-26
  Administered 2020-10-17 – 2020-10-26 (×10): 100 mL via SUBCUTANEOUS

## 2020-10-17 NOTE — Plan of Care
Plan of Care Overview/ Patient Status    Pt is nonverbal, communicates with writing. Anxious, tearful at times. Verbal reassurance provided. Dex continued, weaned to 0.3. Nightly Trazadone given. MAE 3/5. PERRL 3 mm. No c/o pain when asked. HR 70-80s NSR. MAP > 65 maintained on low dose Neo gtt. T max 101.3, improved after Tylenol and ice packs. Edema noted in BUE. Trached with #8 XLT Biv[place]. Maintained on CPAP 5/5 30% overnight without issue. Sxn q3-4hr for thick, yellow sputum. LS dim. Abd rounded, soft. 1 large loose BM. Tolerating TF at goal through PEG. Foley with good UOP. Midline neck under trach w/ xeroform, changed. Skin otherwise intact, PUP placed on coccyx. Q2hr turns. See flowsheets for details.

## 2020-10-17 NOTE — Plan of Care
Plan of Care Overview/ Patient Status    7a-7pNursing Progress NoteEvents:NEURO: Alert, unable to assess orientation d/t trach. Dex off this AM. Pleasant, cooperative with care, occasionally anxious/tearful. Opens eyes, follows commands. MAE 3/5 strength.CV: NSR on tele. HR 70-80's. MAP >65. 	1400- MAP <65, provider notified.RESP: Trached with #8 XLT. CPAP 5/5. Lungs coarse, suction q3-4 hours, patient able to self suction with yankauer. Secretions thick, tan, red streaked.	0900- Patient placed on TM at this time 28%/7L.GI: PEG intact, diabetisource tube feeds @95 . Multiple large liquid BM's this am, providers notified, rectal tube inserted, bowel regimen held. Q6H BG checks. 	GU: Foley catheter in place, adequate UOP. SKIN: See skin flowsheets for details.PAIN: Pain managed PO as per MAR.MOBILITY: Bedrest T/P Q2H. Patient assists with turns/toileting as able.IV: PIVx2. Single lumen port to right chest, accessed.ID: Tmax 100.4, tylenol given. WBC: 9.1. Patient receiving CTX.Labs: Na+: 135; K+: 4.5; BUN/Cr: 45/2.29; H/H: 7.5/23.5; Plt: 44.Electronically Signed by Marvis Repress, RN, October 17, 2020

## 2020-10-17 NOTE — Progress Notes
[facility]	SICU Progress NoteAttending Provider: Alric Quan, MDPost-Operative Day/Post Injury Day Procedure(s):2/5: [name] exchange (ENT) Hospital LOS: 8 days Interim History:  HPI: 55 y.o. male admitted to the SICU on 2/9 for ventilator management following an RRT on the oncology floor for desaturation and increased work of breathing while on trach mask and ultimately placed back on vent. ?Patient was initially a transfer from [facility] on 2/4 after trach and PEG when he had a partial obstruction of trach site from a new necrotic mass. He underwent trach exchange 2/5. Was on trach mask on oncology floor however had 2 episodes on floor requiring RRT for lavage and likely mucous plugging. ?Course Complicated by: - Incidental L IJ thrombus - Multiple episodes of presumed mucous plugging requiring RRT's ?PMH: Chronic ITP, Waldenstrom macroglobulinemia, HTN. HLD, DM, tobacco and alcohol use, Laryngeal SCC (weekly Cisplatin and RT)?PSH: None known ?Home meds: Lipitor, Pepcid, Lisinopril, Metformin, Oxycodone, miralax, Insulin U100 and Lantus ?SICU course:2/9: Arrives to SICU HDS, on vent PCV. Appears well. Transitioned to Pressure support on vent and tolerating well. O/N: Episode of patient feeling like not getting a full breath, extremely tachypenic and increased work of breathing and tachycardia, attempted suctioning and lavage without improvement, attempted to switch to PCV and VAC but due to dyssynchrony unable to tolerate, no desat but poor ventilation and TV and ETCO2 up to 100. ENT at bedside do not feel additional scope would benefit as he was just scoped a few hours ago, ultimately gave fentanyl push and started propofol for vent synchrony but despite max prop still dyssynchronous. Gave one dose of roccuronium once sedated for vent synchrony with good effect. Albuterol and HTS neb given and peaks improved from 50 to 30's. No further paralytic needed and now synchronous on VAC. Kept on propofol for vent synchrony. CXR reassuring and back to GCS 11T. Hypotension only following sedation and Rocc, Low dose Neo started. 2/10: Kept on higher sedation in AM for Page A/P (for staging) and soft tissue neck. Weaning sedation and will plan for PSV trial once more awake. Scoped by ENT in AM, airway patent. Q1-2hr suction requirements of thick tan secretions. No further episodes of respiratory distress although sedation remains high. Tube feeds changed to continuous given significant hyperglycemia. Insulin regimen changed per diabetes team recommendations. Pressure ulceration noted around tracheostomy --> ENT at bedside to evaluation, likely some component of invasive tumor growth. Palliative care consult for assist with symptom management and family moving forward. Primary oncologist office made aware of referral. O/N: weaning down on Prop, no episodes of respiratory distress or desats overnight. Febrile to 101.3, sputum and blood cultures ordered. 500cc LR bolus given for some oliguria and rising Cr. 2/11: Given 500cc NS for oliguria with good response. Vanco level yesterday 25, likely cause of worsening aki, on hold. UOP & lytes remains stable. Insulin adjusted per diabetes team. PSV as tolerated (doing well). CPT added. Confirmed with ENT pulm recs for blue portex suctionaid trach- no plans to switch at this time. Bowel regimen added. Plan keep on neo if low dose but if inc, consider changing to levo as likely then not in setting of sedation. Family updated on phone. Confirmed w heme- no AC rec for R IJ thrombus until plt > 50. Was going to attempt to place pt on TM overnight as he did very well on PSV x 3hrs, however, pt had anxiety attack regarding his cancer and prognosis, he requested to sleep during night and was agreeable to TM in  am. O/N: Patient agrees to PSV overnight which he did very well on. Agreeable to getting OOB to chair and possibly trach mask in AM but wants to stay on vent tonight. Off Neo by AM.  2/12: trial trach mask as tolerated this AM. Adjusted insulin regimen per the diabetes management service. Sputum now with staph, awaiting sensitivities. More loose stools today; nursing placed rectal tube and held bowel regimen. Review of Allergies/Meds/Hx: I have reviewed the patient's: allergies, past medical history, past surgical history, family history, social history, prior to admission medicationsObjective: Vitals:I have reviewed the patient's current vital signs as documented in the EMRLast 24 hours: Temp:  [99.86 ?F (37.7 ?C)-101.3 ?F (38.5 ?C)] 99.86 ?F (37.7 ?C)Pulse:  [72-96] 76Resp:  [0-31] 19BP: (89-136)/(54-68) 125/66SpO2:  [98 %-100 %] 100 %O2 Sat and Device used: SpO2: 100 %Device (Oxygen Therapy): mechanical ventilatorI/O's:I have reviewed the patient's current I&O's as documented in the EMR.Gross Totals (Last 24 hours) at 10/17/2020 0656Last data filed at 10/17/2020 0600Intake 2198.39 ml Output 1675 ml Net 523.39 ml Physical ExamConstitutional:     General: He is not in acute distress.   Appearance: He is not ill-appearing or toxic-appearing. HENT:    Head: Normocephalic and atraumatic.    Right Ear: External ear normal.    Left Ear: External ear normal.    Nose: No congestion or rhinorrhea.    Mouth/Throat:    Mouth: Mucous membranes are moist. Eyes:    Extraocular Movements: Extraocular movements intact.    Pupils: Pupils are equal, round, and reactive to light. Neck:    Comments: Tracheostomy in place Cardiovascular:    Rate and Rhythm: Regular rhythm. Tachycardia present.    Heart sounds: No murmur heard. Pulmonary:    Breath sounds: No wheezing, rhonchi or rales.    Comments: Slight tachypnea Abdominal:    General: There is no distension.    Tenderness: There is no abdominal tenderness.    Comments: PEG tube in place  Genitourinary:   Comments: Foley in place Musculoskeletal:    Cervical back: Normal range of motion.    Right lower leg: No edema.    Left lower leg: No edema. Skin:   General: Skin is warm and dry.    Capillary Refill: Capillary refill takes less than 2 seconds. Neurological:    General: No focal deficit present.    Mental Status: He is alert.    Cranial Nerves: No cranial nerve deficit.    Motor: No weakness. Labs: Refer to EMR. I have reviewed the patient's labs within the last 24 hours; see assessment and plan below for significant abnormals.Microbiology:Refer to EMR. I have reviewed all new results within the last 24 hours; see assessment and plan below.Diagnostics:Refer to EMR. I have reviewed all new results within the last 24 hours; see assessment and plan below.Chest ZO:XWRUE to EMR. I have reviewed all new results within the last 24 hours; see assessment and plan below.Abdominal AV:WUJWJ to EMR. I have reviewed all new results within the last 24 hours; see assessment and plan below.ECG/Tele Events: I have reviewed the patient's ECG and telemetry as resulted in the EMR.Assessment/Plan Neurologic: Analgesia/ Anxiolysis - s/p precedex gtt- Tylenol 650 Q6 PRN - Oxy 5 / 10 SS PRN - Ativan 0.5mg  Q6 prn for anxiety - Trazodone 25mg  Qhs PRN ?ENT: L Laryngeal SCC, invading tracheostomy stoma: s/p trach exchange on 10/10/20 and biopsy on 10/12/20- Bivona trach to bypass obstruction - No surgical / curative options at this time- Plan for palliative radiation 2/15 with eventual  bridge to chemo - Will have tumor board evaluation on Monday 2/14- Palliative care consult placed ?Cardiac: Hypotension, directly related to starting sedation but given febrile, could also be septic- Neo gtt wean as able Hx of HTN/ HLD- Maintain continuous telemetry- Holding home Lisinopril- Holding home Lasix- Continue home crestor ?Respiratory:  Respiratory failure in the setting of mucous plugging and tumor invasion of tracheal stoma- High secretion burden and frequent mucous plugging on floor - Trach mask as tolerated- Tracheal suctioning and lavaging as needed - Albuterol + HTS nebs Q6WA- Atropine 2mg  SL Q4 for secretions  - CPT- Interventional pulm following?GI/Nutrition: - NPO, Diabetisource @ 95cc while on propofol- PEG tube - Nutrition followingLoose stools- Bowel regimen on hold- Consider checking a c. dif is still high output by 2/13- Rectal tube placed 2/12PUD prophylaxis- stop pepcid today?Renal/Fluids/Lytes: AKI on CKD: Baseline Cr (1.5-1.6)Likely iso supRAtherapeutic vanco level- Renal U/S with no hydronephrosis- Foley catheter for strict I&O- Mg/K sliding scales- May need nephrology consult on 2/13 if Cr continues to uptrend?Infectious Disease: Staph PNA, concern for infected necrotic mass?, fevers 2/11- Ceftriaxone (2/10-- Vanco (2/10-	- Follow levels given concern for AKI- s/p Zosyn - 2/11 sputum - staph aureus, sensitivities pending- 2/11 BCx NGTD?Hematology:Acute blood loss anemia- In the setting of surgery - Consider transfusion for H&H<7/21 or hemodynamic instability?Hx of Chronic ITP, Waldenstrom macroglobulinemia- Transfuse platelets <50 if bleeding, <10 if no active bleeding- Hold AC until platelets > 50- Consider dexamethasone/IVIG for ITP if PLT < 20- Trend daily hemolysis retic panel, LDH, haptoglobin- Trend daily DIC markers: PT/PTT/INR, fibrinogen- Appreciate hematology recommendations?R IJ Thrombus: incidentally found on [place]- BUE dopplers with no DVT- S/P high risk heparin gtt discontinued 2/8 given thrombocytopenia - Hematology following, holding on [facility] given thrombocytopenia. Would not start alternative form of AC either (I.e. bival, etc) until ptt > 50 and then can consider heparin high risk bleed gtt?DVT prophylaxis- Maintain SCDs at all times- No chemoprophylaxis at this time?Endocrine: Hx DM II on oral agents and insulin - Insulin Per Diabetes team 	- Lantus 20u QD @ noon	- MDISS	- Standing regular insulin 12u q6?Musculoskeletal: Generalized deconditioning- OOB daily, PT/OT evals- Turn and reposition Q2 hours, PUP dressing?Device Review:10/14/2020 - Bivona trach- Foley - PIV's - PEG- R SC port?Code Status / Dispo:- Full codeNotifications : For questions please call SICU Covering Provider in Epic EMR (primary) SICU M[facility] Dynamic Roles (secondary)[facility] 7-1 APP			[phone] 6-1 Frontside		[phone] 6-1 Backside		[phone] Verdi-2 APP			[phone]:Daylen Lipsky, PA-CSICU APPMHB [phone]/08/2021

## 2020-10-17 NOTE — Progress Notes
Otolaryngology Progress Note:Arthur [name] is a 55 y.o. 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, trach/PEG transferred from [place] due to SOB w/exam significant for tumor invading the trach stoma and scope showing partial obstruction.Interim History:- NAEO, febrile to 101.3Ft- Transiioned to CPAP 5/5 30%; prop -> dex; off phenylephrine drip- Frequent suctioningVitals:Temp:  [99.86 ?F (37.7 ?C)-101.3 ?F (38.5 ?C)] 100.22 ?F (37.9 ?C)Pulse:  [72-100] 100Resp:  [10-32] 17BP: (89-148)/(40-75) 115/40SpO2:  [93 %-100 %] 98 %Device (Oxygen Therapy): tracheostomy collarO2 Flow (L/min):  [7] 7I/O last 3 completed shifts:In: 2787.3 [I.V.:939.8; NG/GT:1545; IV Piggyback:302.5]Out: 1675 [Urine:1675]Physical Exam:Gen: Alert, on CPAP, NADEyes: open, normal external appearanceFace: Grossly symmetricNose: clear anteriorlyOP/OC: hemostaticNeck: Bivona 8 cuffed secured w/soft trach ties, necrotic but hemostatic mass surrounding the stoma, remaining neck flat. Tumor hard.Pulm: no increased WOB on CPAPRecent Labs Lab 02/10/220630 02/10/220630 02/11/220436 WBC 8.3   < > 9.1 HGB 7.6*   < > 7.5* HCT 24.00*   < > 23.50* MCV 88.2   < > 87.7 PLT 40*  --  44*  < > = values in this interval not displayed. Recent Labs Lab 02/11/220436 02/11/220545 02/12/220458 02/12/220550 02/12/221025 NA 135*   < > 136  --   --  K 4.5   < > 4.5  --   --  CL 98   < > 100  --   --  CO2 26   < > 26  --   --  BUN 45*   < > 57*  --   --  CREATININE 2.29*   < > 2.57*  --   --  GLU 263*   < > 325*   < > 303* CALCIUM 8.3*   < > 8.3*  --   --  MG 2.0   < > 2.2  --   --  PHOS 3.6  --  4.2  --   --   < > = values in this interval not displayed. Recent Labs Lab 02/11/220436 02/11/220436 02/12/220458 PTT 21.8*   < > 27.3 INR 0.98  --  1.03  < > = values in this interval not displayed. Recent Labs Lab 02/07/220140 02/10/220630 ALT  --  17 AST  --  17 BILITOT  --  0.2 ALKPHOS  --  72 LIPASE  --  6* ALBUMIN 2.6* 2.8* Invalid input(s): GLUMETAssessment and Plan: Arthur [name] is a 55 y.o. 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 In SICU for ventilatory support. No acute ENT intervention planned with widely patent airway.- Off propofol and ventilator today if possible- When off ventilator, patient should be transferred back to medical service (step down versus respiratory floor). Patient without planned surgical intervention, but has planned palliative chemoradiation.- No acute ENT intervention- Continue goals of care discussion- may need custom trach, but trach non-obstructed by tumor- will discuss at tumor board - continue routine trach care with careful suctioning- ensure trach well secured with ties- care per med onc, radiation oncology, Idelle Leech, MD (PGY-1)Otolaryngology- Head and Neck SurgeryY[facility]             ------------Admitted Inpatients: Contact me by [facility] Inpatient Consults: [phone] ED Consults: [phone]

## 2020-10-17 NOTE — Progress Notes
+:+:+:+:+:+:+:+:+:+:+:+:+:+:+:+:+:+:+:+:+:+:+:+:+:+:+:+:+:+:+:+:+:+:+:+:+:+:+:+SICU Attending Note:Arthur [name], who is a critically ill patient in the [facility] SICU, was examined at the bedside; the chart and new notes were reviewed, laboratory data reviewed, events of the past 24 hours discussed, diagnostic imaging reviewed and interpreted in full including the last CXR, and discussed care and coordinated management with: our ICU APPs, primary team, consulting services, our critical care nurses, and respiratory therapy. 55 y.o. male admitted on 10/09/2020 for dyspnea, SOB, new right IJ DVT -- and found to have large necrotic hard mass surrounding the tracheostomy tube, and concern for tube invasion from this laryngeal cancer. ENT scoped and found partial obstruction, so trach changed to a Bivona, bypassing the obstruction. [place] showed extensive disease, with mets and lymphadenopathy. He was transferred to the SICU on night of 2.9.2022 due to mucous plugging and repeated RRT / TAART on the floor secondary to airway plugging (at least 1 daily for 5 days prior to SICU admission) and need for mechanical ventilator through his existing trach. Today is Hospital Day #9Events in the past 24 hours in the SICU:# shock on pressors# hyperglycemia  +:+:+:+:+:+:+:+:+:+:+:+:+:+:+:+:+:+:+:+:+:+:+:+:+:+:+:+:+:+:+:+:+:+:+:+:+:+:+:+Past Medical History: Diagnosis Date ? Diabetes mellitus (HC Code) (HC CODE)  ? Hypercholesteremia  ? Hypertension  No past surgical history on file.Scheduled Meds:Current Facility-Administered Medications Medication Dose Route Frequency Provider Last Rate Last Admin ? albuterol neb sol 2.5 mg/3 mL (0.083%) (PROVENTIL,VENTOLIN)  2.5 mg Nebulization Q4H Carollee Sires, PA   2.5 mg at 10/17/20 0414 ? cefTRIAXone (ROCEPHIN) 1 g in sodium chloride 0.9 % 10 mL (100 mg/mL)  1 g IV Push Q24H Carollee Sires, PA   1 g at 10/16/20 1129 ? chlorhexidine gluconate (PERIDEX) 0.12 % solution 15 mL  15 mL Mouth/Throat Q12H Orland Dec Aileen Pilot, MD   15 mL at 10/16/20 2021 ? docusate (COLACE) liquid 100 mg  100 mg Per NG tube BID Jacqlyn Larsen, APRN   100 mg at 10/16/20 2021 ? famotidine (PEPCID) tablet 20 mg  20 mg Per G Tube Daily Irine Seal, MD   20 mg at 10/16/20 1610 ? insulin glargine (Semglee,Lantus) injection 15 Units  15 Units Subcutaneous Daily Jacqlyn Larsen, APRN   15 Units at 10/16/20 1148 ? insulin regular human (HumuLIN R, NovoLIN R) 100 unit/mL injection 7 Units  7 Units Subcutaneous Q6H Jacqlyn Larsen, APRN   7 Units at 10/17/20 0602 ? insulin regular human (HumuLIN R, NovoLIN R) Sliding Scale (See admin instructions for dose)   Subcutaneous Q6H [place], [place], PA   6 Units at 10/17/20 0603 ? LORazepam (ATIVAN) 2 mg/mL injection          ? polyethylene glycol (MIRALAX) packet 17 g  17 g Per G Tube Daily Jacqlyn Larsen, APRN     ? Prosource no carb 15 gram protein and 60 kcal  2 packet Per G Tube TID Gearldine Shown, PA   2 packet at 10/16/20 2022 ? rocuronium (ZEMURON) 10 mg/mL injection          ? rosuvastatin (CRESTOR) tablet 10 mg  10 mg Per G Tube Daily Jeanmarie Plant, MD   10 mg at 10/16/20 9604 ? sodium chloride 3 % nebulizer solution 4 mL  4 mL Nebulization Q4H Carollee Sires, [place]   4 mL at 10/17/20 0414 ? traZODone (DESYREL) tablet 25 mg  25 mg Per G Tube Nightly Jennye Boroughs, [place]   25 mg at 10/16/20 2021 ? vancomycin (VANCOCIN) 2 g in sodium chloride 0.9% 500 mL IVPB  2 g Intravenous Once Carollee Sires,  PA 250 mL/hr at 10/17/20 0600 Rate Verify at 10/17/20 0600 ? [START ON 10/18/2020] Vancomycin MAR Level   Intravenous Once Carollee Sires, [place]     ? Vancomycin Therapy Placeholder   Intravenous placeholder Irine Seal, MD     Continuous Infusions:? dexmedetomidine 0.2 mcg/kg/hr (10/17/20 0600) ? phenylephrine 0.25 mcg/kg/min (10/17/20 0600) PRN Meds: acetaminophen, albuterol, dextrose 50% in water (D50W), dextrose 50% in water (D50W), HYDROmorphone, LORazepam, oxyCODONE **OR** oxyCODONE, oxymetazoline +:+:+:+:+:+:+:+:+:+:+:+:+:+:+:+:+:+:+:+:+:+:+:+:+:+:+:+:+:+:+:+:+:+:+:+:+:+:+:+Physical ExamVITALS:Temp (24hrs), Avg:100.5 ?F (38.1 ?C), Min:99.86 ?F (37.7 ?C), Max:101.3 ?F (38.5 ?C)Vitals:  10/17/20 0400 10/17/20 0414 10/17/20 0500 10/17/20 0600 BP: 117/64  119/63 125/66 Pulse: 72 72 72 76 Resp: 20 18 (!) 24 19 Temp: (!) 100.04 ?F (37.8 ?C) (!) 100.04 ?F (37.8 ?C) (!) 100.04 ?F (37.8 ?C) 99.86 ?F (37.7 ?C) TempSrc:     SpO2: 100% 100% 100% 100% Weight:     Height:     Intake/Output Summary (Last 24 hours) at 10/17/2020 0652Last data filed at 10/17/2020 0600Gross per 24 hour Intake 2198.39 ml Output 1675 ml Net 523.39 ml Vitals: BP 125/66  - Pulse 76  - Temp 99.86 ?F (37.7 ?C)  - Resp 19  - Ht 6' 2 (1.88 m)  - Wt (!) 137.9 kg  - SpO2 100%  - BMI 39.03 kg/m? I examined them at the bedside today at 8 AMGeneral: Intubated. Calm and cooperativeNeuro: Following commands, GCS 11T Psych: Unable to assess.Head: Normocephalic, nontraumaticEars: Normal hearingEyes: Pupils equal round and reactive to light and accomodation, extraoccular motions intact  Neck: Bivona trach in place, connected to vent. Hard ulcerated tissue surrounds base of trach.  CV: Regular rate and rhythm. Lungs: Non-labored breathing, on the vent; clear to ascultation bilaterally. Abdomen: Soft, non-distended, non-tender. Gastrostomy tube present. GU: Foley. Extremities/MSK: Warm, well perfused. No splints or casts.Skin: No rashes, ecchymoses, erythemaVascular: 2+ pulses throughout, bilaterally and symmetrical. +:+:+:+:+:+:+:+:+:+:+:+:+:+:+:+:+:+:+:+:+:+:+:+:+:+:+:+:+:+:+:+:+:+:+:+:+:+:+:+Labs Last 24 hours:Most Recent Result Component Value Date/Time  Sodium 136 10/17/2020 04:58 AM  Potassium 4.5 10/17/2020 04:58 AM Chloride 100 10/17/2020 04:58 AM  CO2 26 10/17/2020 04:58 AM  BUN 57 (H) 10/17/2020 04:58 AM  Creatinine 2.57 (H) 10/17/2020 04:58 AM  INR 1.03 10/17/2020 04:58 AM  PTT 27.3 10/17/2020 04:58 AM +:+:+:+:+:+:+:+:+:+:+:+:+:+:+:+:+:+:+:+:+:+:+:+:+:+:+:+:+:+:+:+:+:+:+:+:+:+:+:+Imaging Last 24 hours:No results found.+:+:+:+:+:+:+:+:+:+:+:+:+:+:+:+:+:+:+:+:+:+:+:+:+:+:+:+:+:+:+:+:+:+:+:+:+:+:+:+This patient remains critically ill in the SICU as indicated by the following life-threatening conditions and associated critical interventions. My plan, by active ongoing problem, is to: # metastatic laryngeal cancer: we are coordinating care with ENT and hematology and medical oncology and radiation oncology; per these services, his cancer is not curable, he is a non-operative candidate -- will have tumor board discussion on Monday, February 14; team planning for palliative radiation therapy (not a candidate for chemo); palliative care involved for optimizing his quality of life# acute respiratory failure requiring mechanical ventilation (hypoxemia): we are adjusting the ventilator settings to optimize respiratory function; we are aggressively suctioning patient as needed, given heavy secretion burden and mucous plugging; we have initiatived spontaneous breathing trial today and passed, will put on trach collar off the vent# sepsis: continue antibiotics, 7 days total; adjusted vanco based on elevated levels; cultures pending# DVT: of right IJ; anticoagulation with heparin on hold -- with known ITP and thrombocytopenia; coordinating care with hematology, no anticoagulation until platelets >50# anemia chronic disease: actively monitoring hemoglobins, and determined today that no acute indication for transfusion# acute kidney injury (non-traumatic acute renal failure): we continue supportive care, actively maintaining blood pressure and avoiding nephrotoxins given poor clearance; we are giving boluses as need to support  renal dysfunction and promote kidney perfusion, urination; now making better urine, yesterday oliguria; his bun and creatinine numbers rising over past few days from vancomycin induced nephrotoxicity and hypovolemia; if continue to rise on next check, will engage nephrology# severe protein-calorie malnutrition: related to age, comorbidities, and hypermetabolism associated with acute on chronic illness; tube feeds via PEG tube# hyperglycemia / DM: coordinating care with endocrinology# chronic medical conditions: hypertension; hyperlipidemia; diabetes; alcohol use; ITP and Waldenstroms macroglobulinemiaThe patient has required a total of 50 minutes of my undivided attention (exclusive of procedures & education) in the past 24 hours for their previously noted life threatening diagnoses, and remains at risk for life-threatening decompensation. We will continue with ongoing critical interventions, including medical monitoring, blood pressure and heart rate management, neurologic monitoring, metabolic management, review and interpretation of labs and imaging, and coordination of care.Barbette Hair. Tora Duck, MD MSSICU Attending2/08/2021; 9:52 AM  +:+:+:+:+:+:+:+:+:+:+:+:+:+:+:+:+:+:+:+:+:+:+:+:+:+:+:+:+:+:+:+:+:+:+:+:+:+:+:+

## 2020-10-18 LAB — CBC WITH AUTO DIFFERENTIAL
BKR WAM ABSOLUTE IMMATURE GRANULOCYTES.: 0.08 x 1000/ÂµL (ref 0.00–0.30)
BKR WAM ABSOLUTE LYMPHOCYTE COUNT.: 0.34 x 1000/ÂµL — ABNORMAL LOW (ref 0.60–3.70)
BKR WAM ABSOLUTE NRBC (2 DEC): 0 x 1000/ÂµL (ref 0.00–1.00)
BKR WAM ANALYZER ANC: 6.57 x 1000/ÂµL (ref 2.00–7.60)
BKR WAM BASOPHIL ABSOLUTE COUNT.: 0.02 x 1000/ÂµL (ref 0.00–1.00)
BKR WAM BASOPHILS: 0.3 % (ref 0.0–1.4)
BKR WAM EOSINOPHIL ABSOLUTE COUNT.: 0.14 x 1000/ÂµL (ref 0.00–1.00)
BKR WAM EOSINOPHILS: 1.8 % (ref 0.0–5.0)
BKR WAM HEMATOCRIT (2 DEC): 23.3 % — ABNORMAL LOW (ref 38.50–50.00)
BKR WAM HEMOGLOBIN: 7.1 g/dL — ABNORMAL LOW (ref 13.2–17.1)
BKR WAM IMMATURE GRANULOCYTES: 1 % (ref 0.0–1.0)
BKR WAM LYMPHOCYTES: 4.3 % — ABNORMAL LOW (ref 17.0–50.0)
BKR WAM MCH (PG): 27.4 pg (ref 27.0–33.0)
BKR WAM MCHC: 30.5 g/dL — ABNORMAL LOW (ref 31.0–36.0)
BKR WAM MCV: 90 fL (ref 80.0–100.0)
BKR WAM MONOCYTE ABSOLUTE COUNT.: 0.84 x 1000/ÂµL (ref 0.00–1.00)
BKR WAM MONOCYTES: 10.5 % (ref 4.0–12.0)
BKR WAM MPV: 12.6 fL — ABNORMAL HIGH (ref 8.0–12.0)
BKR WAM NEUTROPHILS: 82.1 % — ABNORMAL HIGH (ref 39.0–72.0)
BKR WAM NUCLEATED RED BLOOD CELLS: 0 % (ref 0.0–1.0)
BKR WAM PLATELETS: 52 x1000/ÂµL — ABNORMAL LOW (ref 150–420)
BKR WAM RDW-CV: 15.7 % — ABNORMAL HIGH (ref 11.0–15.0)
BKR WAM RED BLOOD CELL COUNT.: 2.59 M/ÂµL — ABNORMAL LOW (ref 4.00–6.00)
BKR WAM WHITE BLOOD CELL COUNT: 8 x1000/ÂµL (ref 4.0–11.0)

## 2020-10-18 LAB — PHOSPHORUS     (BH GH L LMW YH): BKR PHOSPHORUS: 3.8 mg/dL (ref 2.2–4.5)

## 2020-10-18 LAB — IMMATURE PLATELET FRACTION (BH GH LMW YH)
BKR WAM IPF, ABSOLUTE: 3.8 x1000/ÂµL (ref <20.0)
BKR WAM IPF: 7.3 % (ref 1.2–8.6)

## 2020-10-18 LAB — BASIC METABOLIC PANEL
BKR ANION GAP: 10 (ref 7–17)
BKR BLOOD UREA NITROGEN: 65 mg/dL — ABNORMAL HIGH (ref 6–20)
BKR BUN / CREAT RATIO: 24.7 — ABNORMAL HIGH (ref 8.0–23.0)
BKR CALCIUM: 8.8 mg/dL (ref 8.8–10.2)
BKR CHLORIDE: 102 mmol/L (ref 98–107)
BKR CO2: 28 mmol/L (ref 20–30)
BKR CREATININE: 2.63 mg/dL — ABNORMAL HIGH (ref 0.40–1.30)
BKR EGFR (AFR AMER): 31 mL/min/{1.73_m2} (ref >60)
BKR EGFR (NON AFRICAN AMERICAN): 25 mL/min/{1.73_m2} (ref >60)
BKR GLUCOSE: 333 mg/dL — ABNORMAL HIGH (ref 70–100)
BKR POTASSIUM: 3.9 mmol/L (ref 3.3–5.3)
BKR SODIUM: 140 mmol/L (ref 136–144)

## 2020-10-18 LAB — LOWER RESP CULTURE, QUAL: BKR LOWER RESPIRATORY CULTURE (QUAL): NORMAL

## 2020-10-18 LAB — RETICULOCYTES
BKR WAM IRF: 24.9 % — ABNORMAL HIGH (ref 3.0–15.9)
BKR WAM RETICULOCYTE - ABS (3 DEC): 0.059 10Ë6 cells/uL (ref 0.023–0.140)
BKR WAM RETICULOCYTE COUNT PCT (1 DEC): 2.2 % (ref 0.6–2.7)
BKR WAM RETICULOCYTE HGB EQUIVALENT: 30.8 pg (ref 28.2–35.7)

## 2020-10-18 LAB — LACTATE DEHYDROGENASE: BKR LACTATE DEHYDROGENASE: 175 U/L (ref 122–241)

## 2020-10-18 LAB — MAGNESIUM: BKR MAGNESIUM: 2.3 mg/dL (ref 1.7–2.4)

## 2020-10-18 LAB — PT/INR AND PTT (BH GH L LMW YH)
BKR INR: 1 (ref 0.87–1.14)
BKR PARTIAL THROMBOPLASTIN TIME: 25.8 s (ref 23.0–31.4)
BKR PROTHROMBIN TIME: 10.9 s (ref 9.6–12.3)

## 2020-10-18 LAB — FIBRINOGEN     (BH GH L LMW YH): BKR FIBRINOGEN LEVEL: 793 mg/dL — ABNORMAL HIGH (ref 194–448)

## 2020-10-18 LAB — VANCOMYCIN, RANDOM     (BH GH LMW YH): BKR VANCOMYCIN RANDOM: 24.6 ug/mL — ABNORMAL HIGH

## 2020-10-18 LAB — HAPTOGLOBIN: BKR HAPTOGLOBIN: 344 mg/dL — ABNORMAL HIGH (ref 30–200)

## 2020-10-18 MED ORDER — INSULIN U-100 REGULAR HUMAN 100 UNIT/ML INJECTION SOLUTION
100 unit/mL | Freq: Four times a day (QID) | SUBCUTANEOUS | Status: DC
Start: 2020-10-18 — End: 2020-10-19
  Administered 2020-10-18 – 2020-10-19 (×3): 100 mL via SUBCUTANEOUS

## 2020-10-18 MED ORDER — VANCOMYCIN MAR LEVEL
Freq: Once | INTRAVENOUS | Status: CP
Start: 2020-10-18 — End: ?

## 2020-10-18 MED ORDER — INSULIN U-100 REGULAR HUMAN 100 UNIT/ML INJECTION SOLUTION
100 unit/mL | Freq: Four times a day (QID) | SUBCUTANEOUS | Status: DC
Start: 2020-10-18 — End: 2020-10-18

## 2020-10-18 NOTE — Plan of Care
Plan of Care Overview/ Patient Status    Day of the off the ventilator. Patient showed progressive to the recovery. Awake, alert and appropriate and denies paranoid and panic status. Antianxiety medication was provided with pain medication upon patient's request. SR on the monitor, acceptable blood pressure, tracheal secretion was moderate, intermittent endotracheal suction was required. Tolerated to the tube feeding, minimum residual was noted. Patient has moderate to severe orthopnea. Also he became to lower than 90% of SPO2 at 12 midnight since he started to sleep. Increased FIO2 was provided when patient is sleeping. Patient was turned and repositioned as tolerated. Please see flow sheet poc and electronic chart for details.

## 2020-10-18 NOTE — Other
[facility] HealthMedicine Consult- Progress NoteAttending Provider: Alric Quan, MDSubjective: Reason for consult: evaluate for transfer to medicine teamHospital course: 55 yo M with Laryngeal SCC (diagnosed 06/2020, treated with cisplatin and RT since 09/14/2020), trach/PEG, DM, HTN, HLD, CKD stage III, Waldenstrom's macroglobulinemia (2014, s/p BRx6 with CR), transferred from [facility] to [facility] for surgical management of tumor invasion of track stoma. When he came in, there was concern for pneumonia, and patient was on vanc/zosyn. Patient was admitted to ENT service and medicine was following as a consult. [place] soft tissue neck 10/10/2020 showed large necrotic laryngeal mass, and a R IJ thrombus. He was started on heparin gtt high risk bleeding protocol, but platelets were worsening so anticoagulation was held. He was transferred to the oncology service the night of 10/13/2019. The plan was to start palliative chemoradiation on Tuesday 08/19/2021. There is tumor board evaluation Monday 10/19/2020. The night of 10/14/2020, he had an RRT for worsening breathing. Was connected to vent and taken to the SICU. There was concern for mucus plugging. The trach was changed to a longer tube so the tumor does not cause sections in the airway. He was continued on vancomycin and ceftriaxone (MRSA positive, so now only on vancomycin now, day 9 of antibiotics). Has improvement in respiratory status. Trial mask was trialed on 10/17/2020. He is very good at suctioning himself, about once an hour. The RN suctions every 4 hours to make sure there is no secretions left behind. 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 Now thrombocytopenia improved, with plt 52, so team will be talking to hematology to restarting heparin gtt. He was constipated for 2 weeks, so received laxatives and had multiple episodes of loose stools on 10/17/2020, rectal tube placed. Laxatives topped. Stools improving so rectal tube will be removed todayAKI has been worsening. Vancomycin level has been elevated and thought to be contributing. More than a day off pressorsInterim History: Patient seen and examine around 10am on Feb 13 2022He reports feeling well. Has some pain around the stoma, but it is not too bed. Feels that breathing is much improved compared to before. Can self suction secretions. Feels comfortable calling for help. No chest pain. No nausea/vomiting or abdominal pain. Diarrhea is improving. Has foley in place (ICU team planning to remove foley when patient leaves the ICU). No fevers, no chills. Mild headache. No runny nose, no sore throat. Review of Meds: Scheduled:Current Facility-Administered Medications Medication Dose Route Frequency Provider Last Rate Last Admin ? albuterol neb sol 2.5 mg/3 mL (0.083%) (PROVENTIL,VENTOLIN)  2.5 mg Nebulization Q4H Carollee Sires, PA   2.5 mg at 10/18/20 8295 ? chlorhexidine gluconate (PERIDEX) 0.12 % solution 15 mL  15 mL Mouth/Throat Q12H Orland Dec Aileen Pilot, MD   15 mL at 10/18/20 6213 ? [Held by provider] docusate (COLACE) liquid 100 mg  100 mg Per NG tube BID Jacqlyn Larsen, APRN   100 mg at 10/16/20 2021 ? famotidine (PEPCID) tablet 20 mg  20 mg Per G Tube Daily Irine Seal, MD   20 mg at 10/18/20 0865 ? insulin glargine (Semglee,Lantus) injection 20 Units  20 Units Subcutaneous Daily Lisette Abu, [place]   20 Units at 10/17/20 1038 ? insulin regular human (HumuLIN R, NovoLIN R) 100 unit/mL injection 20 Units  20 Units Subcutaneous Q6H Lisette Abu, PA     ? insulin regular human (HumuLIN R, NovoLIN R) Sliding  Scale (See admin instructions for dose)   Subcutaneous Q6H Lisette Abu, PA   6 Units at 10/18/20 0600 ? LORazepam (ATIVAN) 2 mg/mL injection          ? [Held by provider] polyethylene glycol (MIRALAX) packet 17 g  17 g Per G Tube Daily Jacqlyn Larsen, APRN     ? Prosource no carb 15 gram protein and 60 kcal  2 packet Per G Tube TID Gearldine Shown, PA   2 packet at 10/18/20 1610 ? rocuronium (ZEMURON) 10 mg/mL injection          ? rosuvastatin (CRESTOR) tablet 10 mg  10 mg Per G Tube Daily Jeanmarie Plant, MD   10 mg at 10/18/20 9604 ? sodium chloride 3 % nebulizer solution 4 mL  4 mL Nebulization Q4H Carollee Sires, [place]   4 mL at 10/18/20 5409 ? traZODone (DESYREL) tablet 25 mg  25 mg Per G Tube Nightly Jennye Boroughs, [place]   25 mg at 10/17/20 2108 ? [START ON 10/19/2020] Vancomycin MAR Level   Intravenous Once Irine Seal, MD     ? Vancomycin Therapy Placeholder   Intravenous placeholder Irine Seal, MD     PRN: acetaminophen, albuterol, dextrose 50% in water (D50W), dextrose 50% in water (D50W), HYDROmorphone, LORazepam, oxyCODONE **OR** oxyCODONE, oxymetazolineContinuous Infusions: Objective: Vitals:Temp:  [98.78 ?F (37.1 ?C)-100.4 ?F (38 ?C)] 98.96 ?F (37.2 ?C)Pulse:  [81-103] 92Resp:  [11-32] 30BP: (100-154)/(40-75) 154/66SpO2:  [91 %-100 %] 99 %Device (Oxygen Therapy): tracheostomy collarO2 Flow (L/min):  [7-10] 10I/O:Gross Totals (Last 24 hours) at 10/18/2020 0834Last data filed at 10/18/2020 0800Intake 2185 ml Output 2130 ml Net 55 ml Physical Exam:	GEN: NAD, alert, answering appropriately yes and no questions. Uses his phone to type and then phone reads what the patient wants to sayHEENT: MMM, sclera non-icteric, EOMI. Trach mask in placePULM: rhonchi bilaterally. Breathing around 25 breaths per minuteCV: RRRABD: soft, NTND, normal BS. PEG tube in place with clean site, no erythema or drainageEXT: no pitting edemaSKIN: no rashesNEURO: able to answer questions appropriately and moving all extremitiesLabs:Recent Labs Lab 02/09/220558 02/09/220558 02/10/220630 02/10/220630 02/11/220436 WBC 7.4  --  8.3  --  9.1 HGB 7.7*   < > 7.6*   < > 7.5* HCT 24.10*   < > 24.00*   < > 23.50* PLT 37*   < > 40*   < > 44*  < > = values in this interval not displayed.  Recent Labs Lab 02/09/220558 02/10/220630 02/11/220436 NEUTROPHILS 79.7* 81.4* 78.4*  Recent Labs Lab 02/11/220436 02/11/220545 02/12/220458 02/12/220550 02/13/220458 NA 135*  --  136  --  140 K 4.5   < > 4.5   < > 3.9 CL 98   < > 100   < > 102 CO2 26   < > 26   < > 28 BUN 45*   < > 57*   < > 65* CREATININE 2.29*   < > 2.57*   < > 2.63* GLU 263*   < > 325*   < > 333* ANIONGAP 11   < > 10   < > 10  < > = values in this interval not displayed.  Recent Labs Lab 02/11/220436 02/11/220436 02/12/220458 02/12/220458 02/13/220458 CALCIUM 8.3*  --  8.3*  --  8.8 MG 2.0   < > 2.2   < > 2.3 PHOS 3.6   < > 4.2   < > 3.8  < > = values in this interval not displayed.  Recent Labs Lab 02/10/220630 ALT  17 AST 17 ALKPHOS 72 BILITOT 0.2  Recent Labs Lab 02/11/220436 02/11/220436 02/12/220458 02/12/220458 02/13/220458 PTT 21.8*  --  27.3  --  25.8 LABPROT 10.7   < > 11.2   < > 10.9 INR 0.98   < > 1.03   < > 1.00  < > = values in this interval not displayed.  FINGERSTICKS: Recent Labs Lab 02/12/220550 02/12/221025 02/12/221517 02/12/222354 02/13/220457 02/13/220458 GLU 283* 303* 309* 287* 269* 333* Micro:MRSA screen positibeSputum culture with MRSADiagnostics:XR Chest PA or APResult Date: 2/10/2022No acute abnormality. Reported And Signed By: Madie Reno, MD  [facility] Radiology and Biomedical ImagingXR Chest PA or APResult Date: 2/9/2022Mild interstitial edema. Consolidation at the right lung base which may be on the basis of atelectasis or pneumonia. Reported And Signed By: Glenna Durand, MD  [facility] Radiology and Biomedical ImagingCT Soft Tissue Neck w IV ContrastResult Date: 2/10/2022Tracheostomy tube in place with tip terminating approximately 4 cm above the carina. Similar large necrotic transposition on mass with invasion into the thyroid gland, trachea, hypopharynx, thyroid cartilage and cervical esophagus with extensive cervical mediastinal and supraclavicular lymphadenopathy. Similar partially occluding thrombus in the right internal jugular vein. Report Initiated By:  Kara Mead, MD Reported And Signed By: Forrestine Him, MD  [facility] Radiology and Biomedical ImagingCT Soft Tissue Neck w IV ContrastResult Date: 2/5/2022Large transspatial necrotic laryngeal mass with stomal invasion and involvement of the thyroid gland, trachea, hypopharynx and proximal cervical esophagus as above. Associated necrotic left level 3 and 4 lymphadenopathy as above. 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 Please see concurrently [place] chest examination for additional details regarding the chest. Report Initiated By:  Loel Lofty, MD Reported And Signed By: Remer Macho, MD  [facility] Radiology and Biomedical ImagingCT Chest w IV Contrast (eg: Empyema, Complex pleural and mediastinal masses, staging of central lung cancer)Result Date: 2/5/2022Partially visualized soft tissue neck mass which is also characterized on the concurrent neck [place]. There is mediastinal adenopathy and circumferential thickening of the trachea concerning for metastatic disease. Bilateral dependent consolidations likely represent aspiration. Pulmonary nodules measuring up to 6 mm which are nonspecific but should be followed on future imaging per oncology protocol to exclude metastatic disease. This study was reviewed by Dr. Leilani Merl. If you have any questions or concerns, call or text [phone] to speak to the radiologist. Reported And Signed By: Leilani Merl, MD  [facility] Radiology and Biomedical ImagingCT Abdomen Pelvis w IV & Oral Contrast (Eval for metastatic disease TCC)Result Date: 2/10/2022New borderline retroperitoneal lymphadenopathy. Reported And Signed By: Lupita Leash, MD  [facility] Radiology and Biomedical ImagingUS RenalResult Date: 2/7/2022No hydronephrosis. Reported And Signed By: Darrell Jewel, MD  [facility] Radiology and Biomedical ImagingUS Duplex Lower Extremity Venous BilatResult Date: 2/7/2022No evidence of deep venous thrombosis of the bilateral lower extremities. Ultrasound is only of moderate sensitivity for the diagnosis of deep vein thrombosis of the calf. If deep vein thrombosis of the calf is of clinical concern, followup ultrasound examination in 4-7 days would be of value to exclude propagation of clot from the calf. Reported And Signed By: Dulce Sellar, MD  [facility] Radiology and Biomedical ImagingUS Duplex Upper Extremity Venous BilateralResult Date: 2/7/2022No evidence of deep venous thrombosis within the bilateral upper limb/neck deep venous system. Superficial venous thrombosis within the right basilic cubital vein at the antecubital fossa. Reported And Signed By: Darrell Jewel, MD  [facility] Radiology and Biomedical ImagingAssessment: 55 yo M with Laryngeal SCC (diagnosed 06/2020, treated with cisplatin and RT since 09/14/2020), trach/PEG, DM, HTN, HLD, CKD  stage III, Waldenstrom's macroglobulinemia (2014, s/p BRx6 with CR), transferred from [facility] to [facility] for surgical management of tumor invasion of track stoma. Hospital course has been complicated by multiple mucus plugging, an RRT requiring intubation and SICU management, MRSA pneumonia, R IJ thrombus, thrombocytopenia, and worsening AKI. Now with improvement in breathing status. Continues on vancomycin for MRSA pneumonia. Off vent for more than a day, and off pressors for more than a day. He can come out of the ICu, but ENT recommends step down on respiratory floor on their note from this morning. He still has frequent suctioning needs (every 1 hour) even thought he is able to self suction. But at night, RN in the MICU has been suctioning ever 3-4 hours. Plan: # Disposition:- please talk to ENT about their recommendation for step down on respitary floor. If patient needs step down, please call the step down attending. If patient needs evaluation for respiratory floor, please call pulmonary consult to evaluate the patient for EP 10-7- I talked to oncology team, oncology team and medicine team are worried about bringing the patient to a regular medicine floor if ENT recommends higher level of care. - at high risk of mucus plugging again so would benefit from close monitoring and frequent suctioningOther problems: # acute hypoxemic resp failure- improved, off vent. On track mask which helps humidify air and loosen secretions. MRSA pneumonia being treatment. Also with evidence of likely aspiration on imaging of the lung. At risk of recurrent mucus plugging - on day 9 of vancomycin - vancomycin level high so still covered by vanc- continue frequent suctioning (patient suctioning every hour during the day). Per sign out, RN suctioning every 3- 4 hours overnight. - aspiration precautions# laryngeal SCC- plan to start palliative chemoradiation this week on Tuesday 10/20/2020. Tumor board meeting Monday 10/19/2020- oncology following# R IJ thrombus- initially on heparin gtt, but heparin stopped due to worsening thrombocytopenia. Now that platelets are improved, the primary team plans to discuss with hematology resuming heparin gtt# DMII with hyperglycemia- endocrinology following# worsening AKI- has a foley, making urine- consider checking urine lytes- does not look volume overloaded on exam. Consider Free water flushes to provide free water- home lisinopril on hold# diarrhea- patient reports having diarrhea after 2 weeks of no bowel movement. Laxatives now on hold and diarrhea improving- rectal tube will be removed todaySigned:Cartez Mogle Chilton Si, MD MScHospitalist Medicine AttendingMHB2/13/20228:34 AMAddendumTalked to ENT, they prefer patient to be on continuous pulse ox and they recommend NP15 floor. Talked to oncology and they are ok with taking over patient care once patient goes to NP15. Jone Baseman MD

## 2020-10-18 NOTE — Progress Notes
+:+:+:+:+:+:+:+:+:+:+:+:+:+:+:+:+:+:+:+:+:+:+:+:+:+:+:+:+:+:+:+:+:+:+:+:+:+:+:+SICU Attending Note:Ahmadou Faulkenberry, who is a critically ill patient in the [facility] SICU, was examined at the bedside; the chart and new notes were reviewed, laboratory data reviewed, events of the past 24 hours discussed, diagnostic imaging reviewed and interpreted in full including the last CXR, and discussed care and coordinated management with: our ICU APPs, primary team, consulting services, our critical care nurses, and respiratory therapy. 55 y.o. male admitted on 10/09/2020 for dyspnea, SOB, new right IJ DVT -- and found to have large necrotic hard mass surrounding the tracheostomy tube, and concern for tube invasion from this laryngeal cancer. ENT scoped and found partial obstruction, so trach changed to a Bivona, bypassing the obstruction. [place] showed extensive disease, with mets and lymphadenopathy. He was transferred to the SICU on night of 2.9.2022 due to mucous plugging and repeated RRT / TAART on the floor secondary to airway plugging (at least 1 daily for 5 days prior to SICU admission) and need for mechanical ventilator through his existing trach. Today is Hospital Day #10Events in the past 24 hours in the SICU:# remains off the vent, on trach collar  +:+:+:+:+:+:+:+:+:+:+:+:+:+:+:+:+:+:+:+:+:+:+:+:+:+:+:+:+:+:+:+:+:+:+:+:+:+:+:+Past Medical History: Diagnosis Date ? Diabetes mellitus (HC Code) (HC CODE)  ? Hypercholesteremia  ? Hypertension  No past surgical history on file.Scheduled Meds:Current Facility-Administered Medications Medication Dose Route Frequency Provider Last Rate Last Admin ? albuterol neb sol 2.5 mg/3 mL (0.083%) (PROVENTIL,VENTOLIN)  2.5 mg Nebulization Q4H Carollee Sires, PA   2.5 mg at 10/18/20 1610 ? chlorhexidine gluconate (PERIDEX) 0.12 % solution 15 mL  15 mL Mouth/Throat Q12H Orland Dec Aileen Pilot, MD   15 mL at 10/18/20 9604 ? [Held by provider] docusate (COLACE) liquid 100 mg  100 mg Per NG tube BID Jacqlyn Larsen, APRN   100 mg at 10/16/20 2021 ? famotidine (PEPCID) tablet 20 mg  20 mg Per G Tube Daily Irine Seal, MD   20 mg at 10/18/20 5409 ? insulin glargine (Semglee,Lantus) injection 20 Units  20 Units Subcutaneous Daily Lisette Abu, [place]   20 Units at 10/17/20 1038 ? insulin regular human (HumuLIN R, NovoLIN R) 100 unit/mL injection 20 Units  20 Units Subcutaneous Q6H Lisette Abu, PA     ? insulin regular human (HumuLIN R, NovoLIN R) Sliding Scale (See admin instructions for dose)   Subcutaneous Q6H Lisette Abu, PA   6 Units at 10/18/20 0600 ? LORazepam (ATIVAN) 2 mg/mL injection          ? [Held by provider] polyethylene glycol (MIRALAX) packet 17 g  17 g Per G Tube Daily Jacqlyn Larsen, APRN     ? Prosource no carb 15 gram protein and 60 kcal  2 packet Per G Tube TID Gearldine Shown, PA   2 packet at 10/18/20 8119 ? rocuronium (ZEMURON) 10 mg/mL injection          ? rosuvastatin (CRESTOR) tablet 10 mg  10 mg Per G Tube Daily Jeanmarie Plant, MD   10 mg at 10/18/20 1478 ? sodium chloride 3 % nebulizer solution 4 mL  4 mL Nebulization Q4H Carollee Sires, [place]   4 mL at 10/18/20 2956 ? traZODone (DESYREL) tablet 25 mg  25 mg Per G Tube Nightly Jennye Boroughs, [place]   25 mg at 10/17/20 2108 ? [START ON 10/19/2020] Vancomycin MAR Level   Intravenous Once Irine Seal, MD     ? Vancomycin Therapy Placeholder   Intravenous placeholder Irine Seal, MD     Continuous Infusions:PRN Meds: acetaminophen, albuterol, dextrose 50%  in water (D50W), dextrose 50% in water (D50W), HYDROmorphone, LORazepam, oxyCODONE **OR** oxyCODONE, oxymetazoline +:+:+:+:+:+:+:+:+:+:+:+:+:+:+:+:+:+:+:+:+:+:+:+:+:+:+:+:+:+:+:+:+:+:+:+:+:+:+:+Physical ExamVITALS:Temp (24hrs), Avg:99.4 ?F (37.4 ?C), Min:98.78 ?F (37.1 ?C), Max:100.4 ?F (38 ?C)Vitals:  10/18/20 0600 10/18/20 0700 10/18/20 0800 10/18/20 0833 BP: (!) 143/63 (!) 151/70 (!) 154/66  Pulse: 90 (!) 93 (!) 92  Resp: (!) 24 (!) 28 (!) 30  Temp: 98.96 ?F (37.2 ?C) 98.96 ?F (37.2 ?C) 98.96 ?F (37.2 ?C)  TempSrc: Bladder Bladder Bladder  SpO2: 100% 100% 99% 99% Weight:     Height:     Intake/Output Summary (Last 24 hours) at 10/18/2020 0854Last data filed at 10/18/2020 0800Gross per 24 hour Intake 2185 ml Output 2130 ml Net 55 ml Vitals: BP (!) 154/66  - Pulse (!) 92  - Temp 98.96 ?F (37.2 ?C) (Bladder)  - Resp (!) 30  - Ht 6' 2 (1.88 m)  - Wt 130.8 kg  - SpO2 99%  - BMI 37.02 kg/m? I examined them at the bedside today at 8 AMGeneral: Calm and cooperativeNeuro: Following commands, GCS 11T Psych: Normal mood and affect.Head: Normocephalic, nontraumaticEars: Normal hearingEyes: Pupils equal round and reactive to light and accomodation, extraoccular motions intact  Neck: Bivona trach in place, on trach collar. Hard ulcerated tissue surrounds base of trach.  CV: Regular rate and rhythm. Lungs: Non-labored breathing, on the vent; clear to ascultation bilaterally. Abdomen: Soft, non-distended, non-tender. Gastrostomy tube present. GU: Foley. Extremities/MSK: Warm, well perfused. No splints or casts.Skin: No rashes, ecchymoses, erythemaVascular: 2+ pulses throughout, bilaterally and symmetrical. +:+:+:+:+:+:+:+:+:+:+:+:+:+:+:+:+:+:+:+:+:+:+:+:+:+:+:+:+:+:+:+:+:+:+:+:+:+:+:+Labs Last 24 hours:Most Recent Result Component Value Date/Time  Sodium 140 10/18/2020 04:58 AM  Potassium 3.9 10/18/2020 04:58 AM  Chloride 102 10/18/2020 04:58 AM  CO2 28 10/18/2020 04:58 AM  BUN 65 (H) 10/18/2020 04:58 AM  Creatinine 2.63 (H) 10/18/2020 04:58 AM  INR 1.00 10/18/2020 04:58 AM  PTT 25.8 10/18/2020 04:58 AM +:+:+:+:+:+:+:+:+:+:+:+:+:+:+:+:+:+:+:+:+:+:+:+:+:+:+:+:+:+:+:+:+:+:+:+:+:+:+:+Imaging Last 24 hours:No results found.+:+:+:+:+:+:+:+:+:+:+:+:+:+:+:+:+:+:+:+:+:+:+:+:+:+:+:+:+:+:+:+:+:+:+:+:+:+:+:+This patient remains critically ill in the SICU as indicated by the following life-threatening conditions and associated critical interventions. My plan, by active ongoing problem, is to: # metastatic laryngeal cancer: we are coordinating care with ENT and hematology and medical oncology and radiation oncology; per these services, his cancer is not curable, he is a non-operative candidate -- will have tumor board discussion tomorrow, on Monday, February 14; team planning for palliative radiation therapy (not a candidate for chemo); palliative care involved for optimizing his quality of life# acute respiratory failure requiring mechanical ventilation (hypoxemia): now on trach collar, we are adjusting the oxygenation settings to optimize respiratory function; we are aggressively suctioning patient as needed# sepsis, pneumonia: now growing MRSA; we will continue antibiotics, 7 days total; we are adjusting vanco based on elevated levels, d/c ceftriaxone at this point# DVT: of right IJ; anticoagulation heparin drip on hold -- with known ITP and thrombocytopenia; coordinating care with hematology, no anticoagulation until platelets >50 -- plts 52 today, but 44 yesterday -- so will continue to hold for now until consistently elevated# anemia chronic disease: actively monitoring hemoglobins, and determined today that no acute indication for transfusion# acute kidney injury (non-traumatic acute renal failure): we continue supportive care, actively maintaining blood pressure and avoiding nephrotoxins given poor clearance; we are giving boluses as need to support renal dysfunction and promote kidney perfusion, urination; good urine output, appears that bun and creatinine numbers are nearly peaking; dysfunction from vancomycin induced nephrotoxicity and hypovolemia# severe protein-calorie malnutrition: related to age, comorbidities, and hypermetabolism associated with acute on chronic illness; tube feeds via PEG tube# hyperglycemia /  DM: coordinating care with endocrinology# chronic medical conditions: hypertension; hyperlipidemia; diabetes; alcohol use; ITP and Waldenstroms macroglobulinemiaThe patient has required a total of 45 minutes of my undivided attention (exclusive of procedures & education) in the past 24 hours for their previously noted life threatening diagnoses, and remains at risk for life-threatening decompensation. We will continue with ongoing critical interventions, including medical monitoring, blood pressure and heart rate management, neurologic monitoring, metabolic management, review and interpretation of labs and imaging, and coordination of care.Barbette Hair. Tora Duck, MD MSSICU Attending2/13/2022; 10:32 AM   +:+:+:+:+:+:+:+:+:+:+:+:+:+:+:+:+:+:+:+:+:+:+:+:+:+:+:+:+:+:+:+:+:+:+:+:+:+:+:+

## 2020-10-18 NOTE — Other
VANCOMYCIN LEVEL EVALUATIONCurrent Vancomycin Order: 2 g IV per laboratory/placeholder data. Day of Therapy: 9Vancomycin Indication: Suspected MRSA infectionEstimated Creatinine Clearance: 37 mL/min (A) (by C-G formula based on SCr of 2.63 mg/dL (H)).Creatinine (mg/dL) Date Value 09/81/1914 2.63 (H) 10/17/2020 2.57 (H) 10/16/2020 2.29 (H)  Renal Function: AKILevel: Lab Results Last 72 Hours Component Value Date/Time  Vancomycin Random 24.6 (H) 10/18/2020 04:58 AM  Vancomycin Random 16.4 (H) 10/17/2020 12:20 AM  Vancomycin Random 25.2 (H) 10/15/2020 11:52 PM Type of Level: Random (24 hours post-dose)Based on vancomycin level obtained, recommend:  Hold dose until level appropriateRepeat Level:  2/14 at 0600 (ordered)ID/AST Consulted? No [facility] Vancomycin Dosing GuidelineBH Vancomycin Dosing GuidelineFor questions, please contact the pharmacist: Smiley Houseman, PharmD    Phone/Mobile Heartbeat: MHB

## 2020-10-18 NOTE — Progress Notes
[name]	SICU Progress NoteAttending Provider: Alric Quan, MDPost-Operative Day/Post Injury Day Procedure(s):2/5: [name] exchange (ENT) Hospital LOS: 9 days Interim History:  HPI: 55 y.o. male admitted to the SICU on 2/9 for ventilator management following an RRT on the oncology floor for desaturation and increased work of breathing while on trach mask and ultimately placed back on vent. ?Patient was initially a transfer from [facility] on 2/4 after trach and PEG when he had a partial obstruction of trach site from a [name] necrotic mass. He underwent trach exchange 2/5. Was on trach mask on oncology floor however had 2 episodes on floor requiring RRT for lavage and likely mucous plugging. ?Course Complicated by: - Incidental L IJ thrombus - Multiple episodes of presumed mucous plugging requiring RRT's ?PMH: Chronic ITP, Waldenstrom macroglobulinemia, HTN. HLD, DM, tobacco and alcohol use, Laryngeal SCC (weekly Cisplatin and RT)?PSH: None known ?Home meds: Lipitor, Pepcid, Lisinopril, Metformin, Oxycodone, miralax, Insulin U100 and Lantus ?SICU course:2/9: Arrives to SICU HDS, on vent PCV. Appears well. Transitioned to Pressure support on vent and tolerating well. O/N: Episode of patient feeling like not getting a full breath, extremely tachypenic and increased work of breathing and tachycardia, attempted suctioning and lavage without improvement, attempted to switch to PCV and VAC but due to dyssynchrony unable to tolerate, no desat but poor ventilation and TV and ETCO2 up to 100. ENT at bedside do not feel additional scope would benefit as he was just scoped a few hours ago, ultimately gave fentanyl push and started propofol for vent synchrony but despite max prop still dyssynchronous. Gave one dose of roccuronium once sedated for vent synchrony with good effect. Albuterol and HTS neb given and peaks improved from 50 to 30's. No further paralytic needed and now synchronous on VAC. Kept on propofol for vent synchrony. CXR reassuring and back to GCS 11T. Hypotension only following sedation and Rocc, Low dose Neo started. 2/10: Kept on higher sedation in AM for [place] A/P (for staging) and soft tissue neck. Weaning sedation and will plan for PSV trial once more awake. Scoped by ENT in AM, airway patent. Q1-2hr suction requirements of thick tan secretions. No further episodes of respiratory distress although sedation remains high. Tube feeds changed to continuous given significant hyperglycemia. Insulin regimen changed per diabetes team recommendations. Pressure ulceration noted around tracheostomy --> ENT at bedside to evaluation, likely some component of invasive tumor growth. Palliative care consult for assist with symptom management and family moving forward. Primary oncologist office made aware of referral. O/N: weaning down on Prop, no episodes of respiratory distress or desats overnight. Febrile to 101.3, sputum and blood cultures ordered. 500cc LR bolus given for some oliguria and rising Cr. 2/11: Given 500cc NS for oliguria with good response. Vanco level yesterday 25, likely cause of worsening aki, on hold. UOP & lytes remains stable. Insulin adjusted per diabetes team. PSV as tolerated (doing well). CPT added. Confirmed with ENT pulm recs for blue portex suctionaid trach- no plans to switch at this time. Bowel regimen added. Plan keep on neo if low dose but if inc, consider changing to levo as likely then not in setting of sedation. Family updated on phone. Confirmed w heme- no AC rec for R IJ thrombus until plt > 50. Was going to attempt to place pt on TM overnight as he did very well on PSV x 3hrs, however, pt had anxiety attack regarding his cancer and prognosis, he requested to sleep during night and was agreeable to TM in  am. O/N: Patient agrees to PSV overnight which he did very well on. Agreeable to getting OOB to chair and possibly trach mask in AM but wants to stay on vent tonight. Off Neo by AM.  2/12: trial trach mask as tolerated this AM. Adjusted insulin regimen per the diabetes management service. Sputum now with staph, awaiting sensitivities. More loose stools today; nursing placed rectal tube and held bowel regimen.  ON: Anxious at times, feeling like he cannot breath but doing well on TM. Frequent suctioning. Increased standing. FIO2 increased to 40%.  insulin to 14U per endo reccs. 2/13: continue to adjust insulin regimen, now 20u regular q6. Cr continues to uptrend 2.57 --> 2.63 and repeat vanco level still elevated 24.6; pharmacy to continue dosing adjustments. Otherwise, remains on trach mask. Sputum with MRSA --> d/c ceftriaxone.Review of Allergies/Meds/Hx: I have reviewed the patient's: allergies, past medical history, past surgical history, family history, social history, prior to admission medicationsObjective: Vitals:I have reviewed the patient's current vital signs as documented in the EMRLast 24 hours: Temp:  [98.78 ?F (37.1 ?C)-100.4 ?F (38 ?C)] 99.14 ?F (37.3 ?C)Pulse:  [72-103] 95Resp:  [11-32] 31BP: (100-148)/(40-75) 125/58SpO2:  [91 %-100 %] 91 %O2 Sat and Device used: SpO2: (!) 91 %Device (Oxygen Therapy): tracheostomy collarI/O's:I have reviewed the patient's current I&O's as documented in the EMR.Gross Totals (Last 24 hours) at 10/18/2020 0015Last data filed at 10/18/2020 0000Intake 2825.56 ml Output 1875 ml Net 950.56 ml Physical ExamConstitutional:     General: He is not in acute distress.   Appearance: He is not ill-appearing or toxic-appearing. HENT:    Head: Normocephalic and atraumatic.    Right Ear: External ear normal.    Left Ear: External ear normal.    Nose: No congestion or rhinorrhea.    Mouth/Throat:    Mouth: Mucous membranes are moist. Eyes:    Extraocular Movements: Extraocular movements intact. Pupils: Pupils are equal, round, and reactive to light. Neck:    Comments: Tracheostomy in place Cardiovascular:    Rate and Rhythm: Regular rhythm. Tachycardia present.    Heart sounds: No murmur heard. Pulmonary:    Breath sounds: No wheezing, rhonchi or rales.    Comments: Slight tachypnea Abdominal:    General: There is no distension.    Tenderness: There is no abdominal tenderness.    Comments: PEG tube in place  Genitourinary:   Comments: Foley in place Musculoskeletal:    Cervical back: Normal range of motion.    Right lower leg: No edema.    Left lower leg: No edema. Skin:   General: Skin is warm and dry.    Capillary Refill: Capillary refill takes less than 2 seconds. Neurological:    General: No focal deficit present.    Mental Status: He is alert.    Cranial Nerves: No cranial nerve deficit.    Motor: No weakness. Labs: Refer to EMR. I have reviewed the patient's labs within the last 24 hours; see assessment and plan below for significant abnormals.Microbiology:Refer to EMR. I have reviewed all [name] results within the last 24 hours; see assessment and plan below.Diagnostics:Refer to EMR. I have reviewed all [name] results within the last 24 hours; see assessment and plan below.Chest ZO:XWRUE to EMR. I have reviewed all [name] results within the last 24 hours; see assessment and plan below.Abdominal AV:WUJWJ to EMR. I have reviewed all [name] results within the last 24 hours; see assessment and plan below.ECG/Tele Events: I have reviewed the patient's ECG and telemetry as resulted in the EMR.Assessment/Plan  Neurologic: Analgesia/ Anxiolysis - s/p precedex gtt- Tylenol 650 Q6 PRN - Oxy 5 / 10 SS PRN - Ativan 0.5mg  Q6 prn for anxiety - Trazodone 25mg  Qhs PRN ?ENT: L Laryngeal SCC, invading tracheostomy stoma: s/p trach exchange on 10/10/20 and biopsy on 10/12/20- Bivona trach to bypass obstruction - No surgical / curative options at this time- Plan for palliative radiation 2/15 with eventual bridge to chemo - Will have tumor board evaluation on Monday 2/14- Palliative care consult placed ?Cardiac: Hypotension, directly related to starting sedation but given febrile, could also be septic-->Resolved-  S/p Neo gtt wean as able Hx of HTN/ HLD- Maintain continuous telemetry- Holding home lisinopril given AKI- Holding home lasix given AKI- Continue home crestor ?Respiratory:  Respiratory failure in the setting of mucous plugging and tumor invasion of tracheal stoma- High secretion burden and frequent mucous plugging on floor - Trach mask as tolerated- Tracheal suctioning and lavaging as needed - Albuterol + HTS nebs + CPT Q6WA- Atropine 2mg  SL Q4 for secretions  - Interventional pulm following?GI/Nutrition: - NPO, Diabetisource @ 95cc - PEG tube - Nutrition followingLoose stools- Bowel regimen on hold- Consider checking a c. dif is still high output - d/c rectal tube todayPUD prophylaxis- No acute indicaition?Renal/Fluids/Lytes: AKI on CKD: Baseline Cr (1.5-1.6)Likely iso supRAtherapeutic vanco level- Renal U/S with no hydronephrosis- Foley catheter for strict I&O, d/c foley catheter today, DTV 2100- Mg/K sliding scales- Consider nephrology consult if Cr continues to uptrend significantly?Infectious Disease: MRSA PNA, concern for infected necrotic mass?- stop ceftriaxone (2/10- 2/13)- Vanco (2/9- 2/16) would be 7d of therapy after RRT event requiring SICU admission	- Follow levels given concern for AKI- s/p Zosyn - 2/11 sputum - MRSA- 2/11 BCx NGTD?Hematology:Acute blood loss anemia- In the setting of surgery - Consider transfusion for H&H<7/21 or hemodynamic instability?Hx of chronic ITP, Waldenstrom macroglobulinemia- Transfuse platelets <50 if bleeding, <10 if no active bleeding- Hold AC until platelets > 50	- Recheck on 2/14 and d/w hematology if [facility] should be restarted- Consider dexamethasone/IVIG for ITP if PLT < 20- Trend daily hemolysis retic panel, LDH, haptoglobin- Trend daily DIC markers: PT/PTT/INR, fibrinogen- Appreciate hematology recommendations?R IJ Thrombus: incidentally found on [name] [place]- BUE dopplers with no DVT- S/P high risk heparin gtt discontinued 2/8 given thrombocytopenia - Hematology following, holding on [facility] given thrombocytopenia. Would not start alternative form of AC either (I.e. bival, etc) until ptt > 50 and then can consider heparin high risk bleed gtt?DVT prophylaxis- Maintain SCDs at all times- No chemoprophylaxis at this time?Endocrine: Hx DM II on oral agents and insulin - Insulin regimen Per Diabetes team 	- Lantus 20u QD @ noon	- MDISS	- Standing regular insulin 20u q6?Musculoskeletal: Generalized deconditioning- OOB daily, PT/OT evals- Turn and reposition Q2 hours, PUP dressing?Device Review:10/14/2020 - Bivona trach- Foley, d/c today- PIV's - PEG- R SC port?Code Status / Dispo:- Full code- Will try to transfer to medical service if they would accept himNotifications : For questions please call SICU Covering Provider in Epic EMR (primary) SICU MHB Dynamic Roles (secondary)Y[facility] 7-1 APP			[phone] 6-1 Frontside		[phone] 6-1 Backside		[phone] Verdi-2 APP			[phone]:Carson Meche, PA-CSICU APPMHB [phone]/13/2022

## 2020-10-18 NOTE — Progress Notes
Inpatient Diabetes Management Team Follow-up Note    Over the past 24 hours, the patient's glucose control has been above goal    Current Nutritional Status:  Continuous tube feeding with Diabetisource at 50 mL/hr   - Total provides: 1800 kcal (2774 kcal w/current propofol rate), 162 gm protein, 120 gm CHO (5 gm/hr), and 979 mL free H2O    ? Current Anti-hyperglycemic Regimen:  Glargine 15 units daily at noon, regular insulin 7 units q6 hours, regular MID dose sliding scale q5 hours    ? Home Anti-hyperglycemic Regimen: Toujeo U300 64 units daily, Apidra 20 units TID with meals, metformin 1000 mg bid     ? Other Contributing Medications:   ? Fentanyl infusion  ? Phenylephrine infusion  ? Propofol infusion     PE:    Patient laying in bed in NAD on the ventilator  Temp:  [99.86 ?F (37.7 ?C)-101.3 ?F (38.5 ?C)] 100.04 ?F (37.8 ?C)  Pulse:  [72-96] 81  Resp:  [10-31] 11  BP: (89-136)/(52-68) 103/52  SpO2:  [93 %-100 %] 98 %  Device (Oxygen Therapy): tracheostomy collar  O2 Flow (L/min):  [7] 7     Wt Readings from Last 3 Encounters:   10/16/20 (!) 137.9 kg   10/06/20 (!) 139.5 kg   09/29/20 (!) 142.4 kg       Data Review:  BGs:      Creatinine (mg/dL)   Date Value   98/07/9146 2.57 (H)     Hemoglobin A1c (%)   Date Value   10/10/2020 9.0 (H)       Assessment:  55 yo h/o type 2 diabetes, HTN, Waldenstroms macroglobulinemia, ETOH, laryngeal SCC s/p trach/PEG presented with SOB significant for tumor invading the trach stoma with partial obstruction and now plan to start palliative chemo radiation 2/15. Patient currently in the SICU for vent management. Diabetes team consulted for assistance in glycemic management.      BG over the past 24 hours have been above goal. Noted AKI. He remains on continuous TF at goal rate. Advise increase in both basal and bolus insulin.     Recommendations:   1. Increase glargine to 20 units  2. Increase regular insulin to 12 units q6 hours. HOLD if TF is held  3. If after 2 doses of 12U or regular BS>180, increase to 14U every 6 hrs  4. Switch to low dose regular insulin sliding scale every 6 hrs  5. Blood glucose monitoring q6 hours  6. Diet: tube feeds per nutrition. Please update diabetes team on any changes to TF regimen     Signed:  Kirke Corin, MD  Endocrine Fellow  Weekend Only  Available through [facility]      NOTE: During nights, weekends, and holidays, please contact the on-call Endocrine Fellow at pager [phone].

## 2020-10-18 NOTE — Progress Notes
Otolaryngology Progress Note:Arthur [name] is a 55 y.o. 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, trach/PEG transferred from [place] due to SOB w/exam significant for tumor invading the trach stoma and scope showing partial obstruction.Interim History:- NAEO, Tmax 100.89F- On trach collar- Off ceftriaxoneVitals:Temp:  [98.78 ?F (37.1 ?C)-100.4 ?F (38 ?C)] 98.96 ?F (37.2 ?C)Pulse:  [83-103] 98Resp:  [17-32] 27BP: (100-154)/(40-75) 148/66SpO2:  [91 %-100 %] 100 %Device (Oxygen Therapy): tracheostomy collarO2 Flow (L/min):  [7-10] 10I/O last 3 completed shifts:In: 2460 [I.V.:2.5; NG/GT:2280; IV Piggyback:177.5]Out: 2315 [Urine:2015; Stool:300]Physical Exam:Gen: Alert, on TM, NADEyes: open, normal external appearanceFace: Grossly symmetricNose: clear anteriorlyOP/OC: hemostaticNeck: Bivona 8 cuffed secured w/soft trach ties, necrotic but hemostatic mass surrounding the stoma, remaining neck flat. Tumor hard.Pulm: no increased WOB on TMRecent Labs Lab 02/11/220436 02/11/220436 02/13/220458 WBC 9.1   < > 8.0 HGB 7.5*   < > 7.1* HCT 23.50*   < > 23.30* MCV 87.7   < > 90.0 PLT 44*  --  52*  < > = values in this interval not displayed. Recent Labs Lab 02/12/220458 02/12/220550 02/13/220458 NA 136   < > 140 K 4.5   < > 3.9 CL 100   < > 102 CO2 26   < > 28 BUN 57*   < > 65* CREATININE 2.57*   < > 2.63* GLU 325*   < > 333* CALCIUM 8.3*   < > 8.8 MG 2.2   < > 2.3 PHOS 4.2  --  3.8  < > = values in this interval not displayed. Recent Labs Lab 02/12/220458 02/12/220458 02/13/220458 PTT 27.3   < > 25.8 INR 1.03  --  1.00  < > = values in this interval not displayed. Recent Labs Lab 02/07/220140 02/10/220630 ALT  --  17 AST  --  17 BILITOT  --  0.2 ALKPHOS  --  72 LIPASE  --  6* ALBUMIN 2.6* 2.8* Invalid input(s): GLUMETAssessment and Plan: Arthur [name] is a 55 y.o. 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 In SICU for ventilatory support. No acute ENT intervention planned with widely patent airway.- When ready to leave SICU, patient should be transferred back to medical service. OK for floor, however patient would benefit from being on continuous O2 monitoring (e.g. NP15). Patient without planned surgical intervention, but has planned palliative radiation, possible chemo- No ENT intervention planned at this time, and airway widely patent- Continue goals of care discussion- may need custom trach in future- will discuss at tumor board - continue routine trach care with careful suctioning- ensure trach well secured with ties- care per med onc, radiation oncology, Idelle Leech, MD (PGY-1)Otolaryngology- Head and Neck SurgeryY[facility]             ------------Admitted Inpatients: Contact me by [facility] Inpatient Consults: [phone] ED Consults: [phone]

## 2020-10-18 NOTE — Progress Notes
Inpatient Diabetes Management Team Follow-up Note    Over the past 24 hours, the patient's glucose control has been above goal    Current Nutritional Status:  Continuous tube feeding with Diabetisource at 50 mL/hr   - Total provides: 1800 kcal (2774 kcal w/current propofol rate), 162 gm protein, 120 gm CHO (5 gm/hr), and 979 mL free H2O    ? Current Anti-hyperglycemic Regimen:  Glargine 20 units daily at noon, regular insulin 20 units q6 hours, regular MID dose sliding scale q5 hours    ? Home Anti-hyperglycemic Regimen: Toujeo U300 64 units daily, Apidra 20 units TID with meals, metformin 1000 mg bid     ? Other Contributing Medications:   ? Fentanyl infusion  ? Phenylephrine infusion  ? Propofol infusion     PE:    Patient laying in bed in NAD on the ventilator  Temp:  [98.78 ?F (37.1 ?C)-100.4 ?F (38 ?C)] 98.96 ?F (37.2 ?C)  Pulse:  [90-103] 98  Resp:  [17-32] 27  BP: (100-154)/(40-75) 148/66  SpO2:  [91 %-100 %] 100 %  Device (Oxygen Therapy): tracheostomy collar  O2 Flow (L/min):  [7-10] 10     Wt Readings from Last 3 Encounters:   10/18/20 130.8 kg   10/06/20 (!) 139.5 kg   09/29/20 (!) 142.4 kg       Data Review:  BGs:      Creatinine (mg/dL)   Date Value   16/06/9603 2.63 (H)     Hemoglobin A1c (%)   Date Value   10/10/2020 9.0 (H)       Assessment:  55 yo h/o type 2 diabetes, HTN, Waldenstroms macroglobulinemia, ETOH, laryngeal SCC s/p trach/PEG presented with SOB significant for tumor invading the trach stoma with partial obstruction and now plan to start palliative chemo radiation 2/15. Patient currently in the SICU for vent management. Diabetes team consulted for assistance in glycemic management.      BG over the past 24 hours have been above goal. Noted AKI. He remains on continuous TF at goal rate. Advise increase in regular insulin    Recommendations:   1. Continue glargine to 20 units  2. Continue regular insulin 20 units q6 hours. HOLD if TF is held  3. If after 2 doses of 20U or regular BS>180, increase to 24U every 6 hrs  4. Switch to low dose regular insulin sliding scale every 6 hrs  5. Blood glucose monitoring q6 hours  6. Diet: tube feeds per nutrition. Please update diabetes team on any changes to TF regimen     Signed:  Kirke Corin, MD  Endocrine Fellow  Weekend Only  Available through [facility]      NOTE: During nights, weekends, and holidays, please contact the on-call Endocrine Fellow at pager [phone].

## 2020-10-18 NOTE — Other
-    CONSULT  REQUEST  DOCUMENTATION  -  [facility] NOTE  -  Type of consult: [facility] Internal Medicine   -  New Consult: [id number] [name] /Location: 7112/7112-A / Reason for Consult? evaluate for transfer to medical service; obstructing ENT malignancy s/p extra long trach, no on trach mask >24hr; non-operatable, plan for palliative chemoXRT/Callback Cell Phone: [phone] / Please confirm receipt of this message by texting back ?OK?  -  1 - Mobile Heartbeat message sent to Green, B at 8:20 AM. Received response at 08:27.  Trudee Grip IV, PCT  10/18/2020  8:20 AM  Consult Connect Center [phone]

## 2020-10-19 ENCOUNTER — Ambulatory Visit: Admit: 2020-10-19 | Payer: PRIVATE HEALTH INSURANCE

## 2020-10-19 ENCOUNTER — Ambulatory Visit: Admit: 2020-10-19 | Payer: PRIVATE HEALTH INSURANCE | Attending: Hematology & Oncology

## 2020-10-19 LAB — BASIC METABOLIC PANEL
BKR ANION GAP: 12 (ref 7–17)
BKR BLOOD UREA NITROGEN: 64 mg/dL — ABNORMAL HIGH (ref 6–20)
BKR BUN / CREAT RATIO: 25.9 — ABNORMAL HIGH (ref 8.0–23.0)
BKR CALCIUM: 9.3 mg/dL (ref 8.8–10.2)
BKR CHLORIDE: 102 mmol/L (ref 98–107)
BKR CO2: 28 mmol/L (ref 20–30)
BKR CREATININE: 2.47 mg/dL — ABNORMAL HIGH (ref 0.40–1.30)
BKR EGFR (AFR AMER): 33 mL/min/{1.73_m2} (ref >60)
BKR EGFR (NON AFRICAN AMERICAN): 27 mL/min/{1.73_m2} (ref >60)
BKR GLUCOSE: 365 mg/dL — ABNORMAL HIGH (ref 70–100)
BKR POTASSIUM: 4.3 mmol/L (ref 3.3–5.3)
BKR SODIUM: 142 mmol/L (ref 136–144)

## 2020-10-19 LAB — RETICULOCYTES
BKR WAM IRF: 32.4 % — ABNORMAL HIGH (ref 3.0–15.9)
BKR WAM RETICULOCYTE - ABS (3 DEC): 0.072 10Ë6 cells/uL (ref 0.023–0.140)
BKR WAM RETICULOCYTE COUNT PCT (1 DEC): 2.5 % (ref 0.6–2.7)
BKR WAM RETICULOCYTE HGB EQUIVALENT: 27.2 pg — ABNORMAL LOW (ref 28.2–35.7)

## 2020-10-19 LAB — CBC WITH AUTO DIFFERENTIAL
BKR WAM ABSOLUTE IMMATURE GRANULOCYTES.: 0.07 x 1000/ÂµL (ref 0.00–0.30)
BKR WAM ABSOLUTE LYMPHOCYTE COUNT.: 0.37 x 1000/ÂµL — ABNORMAL LOW (ref 0.60–3.70)
BKR WAM ABSOLUTE NRBC (2 DEC): 0 x 1000/ÂµL (ref 0.00–1.00)
BKR WAM ANALYZER ANC: 6.94 x 1000/ÂµL (ref 2.00–7.60)
BKR WAM BASOPHIL ABSOLUTE COUNT.: 0.02 x 1000/ÂµL (ref 0.00–1.00)
BKR WAM BASOPHILS: 0.2 % (ref 0.0–1.4)
BKR WAM EOSINOPHIL ABSOLUTE COUNT.: 0.22 x 1000/ÂµL (ref 0.00–1.00)
BKR WAM EOSINOPHILS: 2.6 % (ref 0.0–5.0)
BKR WAM HEMATOCRIT (2 DEC): 25.8 % — ABNORMAL LOW (ref 38.50–50.00)
BKR WAM HEMOGLOBIN: 7.8 g/dL — ABNORMAL LOW (ref 13.2–17.1)
BKR WAM IMMATURE GRANULOCYTES: 0.8 % (ref 0.0–1.0)
BKR WAM LYMPHOCYTES: 4.3 % — ABNORMAL LOW (ref 17.0–50.0)
BKR WAM MCH (PG): 27.5 pg (ref 27.0–33.0)
BKR WAM MCHC: 30.2 g/dL — ABNORMAL LOW (ref 31.0–36.0)
BKR WAM MCV: 90.8 fL (ref 80.0–100.0)
BKR WAM MONOCYTE ABSOLUTE COUNT.: 0.94 x 1000/ÂµL (ref 0.00–1.00)
BKR WAM MONOCYTES: 11 % (ref 4.0–12.0)
BKR WAM MPV: 12.3 fL — ABNORMAL HIGH (ref 8.0–12.0)
BKR WAM NEUTROPHILS: 81.1 % — ABNORMAL HIGH (ref 39.0–72.0)
BKR WAM NUCLEATED RED BLOOD CELLS: 0 % (ref 0.0–1.0)
BKR WAM PLATELETS: 52 x1000/ÂµL — ABNORMAL LOW (ref 150–420)
BKR WAM RDW-CV: 15.8 % — ABNORMAL HIGH (ref 11.0–15.0)
BKR WAM RED BLOOD CELL COUNT.: 2.84 M/ÂµL — ABNORMAL LOW (ref 4.00–6.00)
BKR WAM WHITE BLOOD CELL COUNT: 8.6 x1000/ÂµL (ref 4.0–11.0)

## 2020-10-19 LAB — FIBRINOGEN     (BH GH L LMW YH): BKR FIBRINOGEN LEVEL: >860 mg/dL — ABNORMAL HIGH (ref 194–448)

## 2020-10-19 LAB — PT/INR AND PTT (BH GH L LMW YH)
BKR INR: 0.98 (ref 0.87–1.14)
BKR PARTIAL THROMBOPLASTIN TIME: 25.4 s (ref 23.0–31.4)
BKR PROTHROMBIN TIME: 10.7 s (ref 9.6–12.3)

## 2020-10-19 LAB — VANCOMYCIN, RANDOM     (BH GH LMW YH): BKR VANCOMYCIN RANDOM: 15.1 ug/mL — ABNORMAL HIGH

## 2020-10-19 LAB — HAPTOGLOBIN: BKR HAPTOGLOBIN: 420 mg/dL — ABNORMAL HIGH (ref 30–200)

## 2020-10-19 LAB — PHOSPHORUS     (BH GH L LMW YH): BKR PHOSPHORUS: 4.4 mg/dL (ref 2.2–4.5)

## 2020-10-19 LAB — IMMATURE PLATELET FRACTION (BH GH LMW YH)
BKR WAM IPF, ABSOLUTE: 3.3 x1000/ÂµL (ref <20.0)
BKR WAM IPF: 6.3 % (ref 1.2–8.6)

## 2020-10-19 LAB — LACTATE DEHYDROGENASE: BKR LACTATE DEHYDROGENASE: 202 U/L (ref 122–241)

## 2020-10-19 LAB — MAGNESIUM: BKR MAGNESIUM: 2.4 mg/dL (ref 1.7–2.4)

## 2020-10-19 MED ORDER — INSULIN U-100 REGULAR HUMAN 100 UNIT/ML INJECTION SOLUTION
100 unit/mL | Freq: Four times a day (QID) | SUBCUTANEOUS | Status: DC
Start: 2020-10-19 — End: 2020-10-22
  Administered 2020-10-19 – 2020-10-22 (×11): 100 mL via SUBCUTANEOUS

## 2020-10-19 MED ORDER — GABAPENTIN 300 MG/6 ML (6 ML) ORAL SOLUTION
300 mg/6 mL (6 mL) | Freq: Once | GASTROSTOMY | Status: DC
Start: 2020-10-19 — End: 2020-10-19

## 2020-10-19 MED ORDER — LORAZEPAM 0.5 MG TABLET
0.5 mg | Freq: Four times a day (QID) | GASTROSTOMY | Status: DC | PRN
Start: 2020-10-19 — End: 2020-10-22
  Administered 2020-10-19 – 2020-10-21 (×7): 0.5 mg via GASTROSTOMY

## 2020-10-19 MED ORDER — HEPARIN 25,000 UNIT/250 0.45 % NS (HIGH RISK OF BLEEDING-ALL SITES)
25000 unit/250 mL | INTRAVENOUS | Status: DC
Start: 2020-10-19 — End: 2020-10-19

## 2020-10-19 MED ORDER — GABAPENTIN 300 MG/6 ML (6 ML) ORAL SOLUTION
300 mg/6 mL (6 mL) | Freq: Three times a day (TID) | GASTROSTOMY | Status: DC
Start: 2020-10-19 — End: 2020-10-19

## 2020-10-19 MED ORDER — HEPARIN (PORCINE) 5,000 UNIT/ML INJECTION SOLUTION
5000 unit/mL | Freq: Three times a day (TID) | SUBCUTANEOUS | Status: DC
Start: 2020-10-19 — End: 2020-10-20
  Administered 2020-10-20 (×2): via SUBCUTANEOUS

## 2020-10-19 MED ORDER — ALBUTEROL SULFATE 2.5 MG/3 ML (0.083 %) SOLUTION FOR NEBULIZATION
2.5 mg /3 mL (0.083 %) | Freq: Four times a day (QID) | RESPIRATORY_TRACT | Status: DC
Start: 2020-10-19 — End: 2020-11-12
  Administered 2020-10-19 – 2020-11-12 (×67): 2.5 mL via RESPIRATORY_TRACT

## 2020-10-19 MED ORDER — LORAZEPAM 2 MG/ML INJECTION SOLUTION
2 mg/mL | Freq: Once | INTRAVENOUS | Status: CP
Start: 2020-10-19 — End: ?
  Administered 2020-10-20: 13:00:00 2 mL via INTRAVENOUS

## 2020-10-19 MED ORDER — VANCOMYCIN IVPB (2 G IN 500ML NS)
Freq: Once | INTRAVENOUS | Status: DC
Start: 2020-10-19 — End: 2020-10-19

## 2020-10-19 MED ORDER — VANCOMYCIN MAR LEVEL
Freq: Once | INTRAVENOUS | Status: DC
Start: 2020-10-19 — End: 2020-10-19

## 2020-10-19 MED ORDER — SODIUM CHLORIDE 3 % FOR NEBULIZATION
3 % | Freq: Four times a day (QID) | RESPIRATORY_TRACT | Status: DC
Start: 2020-10-19 — End: 2020-10-21
  Administered 2020-10-19 – 2020-10-21 (×5): 3 mL via RESPIRATORY_TRACT

## 2020-10-19 MED ORDER — AMINO ACIDS-PROTEIN HYDROLYSATE 15 GRAM-60 KCAL/30 ML ORAL LIQUID PACK
15-60 gram-kcal/30 mL | Freq: Two times a day (BID) | GASTROSTOMY | Status: DC
Start: 2020-10-19 — End: 2020-10-21
  Administered 2020-10-20 – 2020-10-21 (×4): 15-60 gram-kcal/30 mL via GASTROSTOMY

## 2020-10-19 MED ORDER — INSULIN U-100 REGULAR HUMAN 100 UNIT/ML INJECTION SOLUTION
100 unit/mL | Freq: Four times a day (QID) | SUBCUTANEOUS | Status: DC
Start: 2020-10-19 — End: 2020-10-19

## 2020-10-19 MED ORDER — INSULIN U-100 REGULAR HUMAN 100 UNIT/ML INJECTION SOLUTION
100 unit/mL | Freq: Four times a day (QID) | SUBCUTANEOUS | Status: DC
Start: 2020-10-19 — End: 2020-10-19
  Administered 2020-10-19 (×2): 100 mL via SUBCUTANEOUS

## 2020-10-19 MED ORDER — GABAPENTIN 300 MG/6 ML (6 ML) ORAL SOLUTION
300 mg/6 mL (6 mL) | Freq: Three times a day (TID) | NASOGASTRIC | Status: DC
Start: 2020-10-19 — End: 2020-10-24
  Administered 2020-10-19 – 2020-10-24 (×15): 300 mL via NASOGASTRIC

## 2020-10-19 NOTE — Progress Notes
Short Progress Note Consented for palliative radiation and performed [place] simulation today. Required 1mg  IV ativan for mask and immobilization. Custom bolus and mask made. Recommendations: - Initiate palliative radiation 24Gy/6 fractions on Tuesday 2/15 in hopes to bridge to chemotherapy- As this is dose-escalated radiation, please do not add radiosensitizing agents without first calling rad onc to further discuss.- Will need ativan prior to radiation appointments- will correspond with floor/nursing regarding timing Asa Saunas, MDTherapeutic RadiologyPGY-5 Resident PhysicianYNHH #: [phone] [facility] #: [phone]/9/2022ATTENDING PHYSICIAN ADDENDUM:In brief, this patient was seen in consultation by our department earlier this week and I have now assumed care given his head and neck primary cancer.  I have reviewed his outside treatment details as well as clinical examination.  Given evidence of tumor growth through the stoma as well as now extension of disease into the mediastinum, based on review of outside records, the mediastinal disease was outside of the prior radiotherapy field therefore was not receiving treatment.  Thus, disease progression at this stage may not represent treatment failure or radio resistance.  In order to fully encompass the clinically and radiographic evident disease, replan Ng would be required with is the patient returns to [place] or resumes treatment at [facility].  Therefore, we discussed with the patient and his significant other resuming radiotherapy with modified planned.  Given he has low platelet count is and is not currently a candidate for chemotherapy, we discussed dose escalated radiotherapy to try to provide best chance of maximal control in the short-term and hopefully bridge him to either chemotherapy or further radiotherapy to disease that was outside of the initial radiation field.  He signed informed consent and was able to tolerate [place] simulation today.  We are working to accelerate treatment and have plan to start radiotherapy for at least 6 fractions of dose escalated treatment to begin Tuesday, February 15.  We do recommend restaging of the chest abdomen and pelvis given potential small focus of metastatic disease in the left iliac at diagnosis on PET- in October to confirm no other sites of distant metastatic disease as well.  This will better inform how aggressive to be with any local radiotherapy.*This note was composed using dictation software, and transcription errors may occur in the body of the text above.Joie Reamer Rasar Maple Hudson, MD/PhDAssistant ProfessorRadiation Oncology

## 2020-10-19 NOTE — Other
-    CONSULT  REQUEST  DOCUMENTATION  -  [facility]ENTER NOTE  -  Type of consult: [facility] Hematology   -  New Consult: [id number] Arthur [name] /Location: 7112/7112-A / Brief Clinical Question: guidance for anticoagulation for ITP/Callback Cell Phone: [phone] / Please confirm receipt of this message by texting back ?OK?  -  1 - Mobile Heartbeat message sent to [facility] at 8:25 AM. Received response at 0827.  Lilia Pro  10/19/2020  8:25 AM  Consult Connect Center [phone]

## 2020-10-19 NOTE — Plan of Care
[facility]		Spiritual Care NotePurpose:   Observation: People present/  Emotional         Subjective: Recommendation was made that he would benefit from visit.Visited with pt he has a trache but has a his cell phone and uses a program that speaks what he types and he moves his lips if you can read them.He has support of family and friends.He identifies at spiritualHe says he is receiving good care and now needs chemo/radiation.He welcomes prayers.I provided emotional support and active listening.Spiritual Assessment:                    NA  NASpiritual Interventions:Spiritual Intervention Index**Date of Spiritual Visit: 02/14/22Visit Type: Initial VisitIntervention Type: Spiritual VisitResponding Chaplain: Unit ChaplainConsulted With: PCA/PCTSpiritual/Religious Support Provided: Companionship, HospitalityOutcome: OUTCOMES: Expressed Spiritual/Religious Resources or Distress: Expressed gratitude OUTCOMES: Expressed Emotional Resources or Distress: Expressed increased desire to heal OUTCOMES: Progressed Spiritually: Knowledgeable about services of the Dept. of Spiritual Care OUTCOMES: Progressed Emotionally or Physically: Increased comfort     Plan: I shared that Chaplain services are available 24/7 as needed.    Total Consult Time: 16 minutes	Signed:Chaplain Sharin Grave Sr. MDiv, BCCMHB [phone], (Mon-Fri 8am-4:30pm), On-Call [facility] [phone] I informed the patient of the 24/7 availability of spiritual care and to ask the nurse to call the chaplain if needed. 2/14/202211:33 AMPlan of Care Overview/ Patient Status

## 2020-10-19 NOTE — Plan of Care
Plan of Care Overview/ Patient Status    Pt alert, anxious at times. PRN Ativan admin for anxiety with good affect. IV dilaudid admin for pain with good affect. PERRL. Afebrile. HR 100s sinus rhythm. SBPs 110s-150s. Moderate amount thick, blood-tinged secretions suctioned via trach per RT in afternoon. Pt on 40% trach mask. Trach care done. NPO. Pt tolerating Diabetasource tube feed at goal 23mL/hr. Minimal residual. Voiding in urinal. See documentation for vitals. Leroy Libman, RN

## 2020-10-19 NOTE — Plan of Care
Plan of Care Overview/ Patient Status    The patient is more anxious tonight. Sinus tachycardia was noted without febrile, denies any shortness of breath yet, he reported about tightness from swelling neck. Nebulizer treatment was provided as scheduled. Tolerated to the tube feeding. Hyperglycemia was treated with new sliding and standing dose of insulin ss. Voided without any issues. Please see flow sheet poc and electronic chart for details.

## 2020-10-19 NOTE — Plan of Care
Problem: Communication Impairment (Mechanical Ventilation, Invasive)Goal: Effective CommunicationOutcome: Outcome(s) achieved Problem: Device-Related Complication Risk (Mechanical Ventilation, Invasive)Goal: Optimal Device FunctionOutcome: Outcome(s) achieved Problem: Inability to Wean (Mechanical Ventilation, Invasive)Goal: Mechanical Ventilation LiberationOutcome: Outcome(s) achieved Problem: Nutrition Impairment (Mechanical Ventilation, Invasive)Goal: Optimal Nutrition DeliveryOutcome: Outcome(s) achieved Problem: Skin and Tissue Injury (Mechanical Ventilation, Invasive)Goal: Absence of Device-Related Skin and Tissue InjuryOutcome: Outcome(s) achieved Problem: Ventilator-Induced Lung Injury (Mechanical Ventilation, Invasive)Goal: Absence of Ventilator-Induced Lung InjuryOutcome: Outcome(s) achieved Plan of Care Overview/ Patient Status    Patient alert and oriented, able to communicate by phone text to voice. Anxious at times requiring PRN Ativan. PRN Oxycodone and Dilaudid for pain along with scheduled Gabapentin. VSS; afebrile. Tolerating trach mask, suctioning q4h. TF at goal via PEG, minimal residuals. Voiding in urinal. 1 BM today. Skin is unchanged; turned and repositioned q2h. See flowsheets for details. Plan to transfer to floor.

## 2020-10-19 NOTE — Other
VANCOMYCIN LEVEL EVALUATIONCurrent Vancomycin Order: 2 g IV per laboratory/placeholder data. Day of Therapy: 10Vancomycin Indication: Suspected MRSA infectionEstimated Creatinine Clearance: 39 mL/min (A) (by C-G formula based on SCr of 2.47 mg/dL (H)).Creatinine (mg/dL) Date Value 16/06/9603 2.47 (H) 10/18/2020 2.63 (H) 10/17/2020 2.57 (H)  Renal Function: AKILevel: Lab Results Last 72 Hours Component Value Date/Time  Vancomycin Random 15.1 (H) 10/19/2020 04:55 AM  Vancomycin Random 24.6 (H) 10/18/2020 04:58 AM  Vancomycin Random 16.4 (H) 10/17/2020 12:20 AM Type of Level: Random (48 hours post-dose)Based on vancomycin level obtained, recommend:   re-dose vancomycin 2g IV once (ordered)Repeat Level:  2/16 at 0600 (ordered)ID/AST Consulted? No [facility] Vancomycin Dosing GuidelineBH Vancomycin Dosing GuidelineFor questions, please contact the pharmacist: Smiley Houseman, PharmD    Phone/Mobile Heartbeat: [facility]

## 2020-10-19 NOTE — Plan of Care
Inpatient Physical Therapy Evaluation     IP Adult PT Eval/Treat - 10/18/20 1307          Date of Visit / Treatment    Date of Visit / Treatment 10/18/20     Note Type Evaluation     Progress Report Due 11/02/20     End Time 1307        General Information    Pertinent History Of Current Problem 55 y.o. 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 Patient was at his oncology outpatient clinic visit on 2/1 and exam was significant for necrotic mass surrounding the trach stoma causing significantly open wound c/f tumor encroachment. Unable to lay flat to get imaging. Transferred to [facility] 2/4 for trach management. ENT scoped and found partial obstruction, so trach changed to a Bivona, bypassing the obstruction. [place] showed extensive disease, with mets and lymphadenopathy. He was transferred to the SICU on night of 2.9.2022 due to mucous plugging and repeated RRT / TAART on the floor secondary to airway plugging (at least 1 daily for 5 days prior to SICU admission) and need for mechanical ventilator through his existing trach. Now on trach mask.     Subjective asking to be able to look out window     General Observations pt in bed with trach/trach mask, PEG, tele/O2 monitor, BP cuff, PIV, foley, rectal tube     Precautions/Limitations fall precautions        Prior Level of Functioning/Social History    Additional Comments lives alone, independent at baseline, was at rehab prior to hospital admission        Vital Signs and Orthostatic Vital Signs    Vital Signs Vital Signs Stable   per monitor    Vital Signs Free text uses yankauer to suction        Pain/Comfort    Pain Comment (Pre/Post Treatment Pain) no c/o pain        Cognition    Cognition Comments alert, able to follow commands, mouths words, has board to write, able to communicate needs        Range of Motion    Range of Motion Examination no ROM deficits were identified        Musculoskeletal    LUE Muscle Strength Grading 5-->normal muscle strength     RUE Muscle Strength Grading 5-->normal muscle strength     LLE Muscle Strength Grading 3-->active movement against gravity     RLE Muscle Strength Grading 3-->active movement against gravity     Musculoskeletal Comments actively moves extremities in bed        Muscle Tone    Muscle Tone Testing Results No muscle tone deficits noted        Sensory Assessment    Sensory Tests Results Not tested        Skin Assessment    Skin Assessment See Nursing Documentation        Balance    Balance Skills Training Comment able to sit independently edge of bed; standing min assist of 2, steady taking steps to transfer        Bed Mobility    Supine-to-Sit Independence/Assistance Level Minimum assist;Assist of 1     Supine-to-Sit Assist Device Hand held assist;Head of bed elevated        Sit-Stand Transfer Training    Sit-to-Stand  Transfer Independence/Assistance Level Minimum assist;Assist of 2     Sit-to-Stand Transfer Assist Device Hand held assist     Stand-to-Sit Transfer Independence/Assistance Level Minimum assist;Assist of 2     Stand-to-Sit Transfer Assist Device Hand held assist        Bed-Chair Transfer Training    Bed-to-Chair Transfer Independence/Assistance Level Minimum assist;Assist of 2     Bed-to-Chair Transfer Assist Device Hand held assist     Bed-Chair Transfer Comments able to take steps to transfer        Handoff Documentation    Handoff --   RN present; pt reclined in chair       PT- AM-PAC - Basic Mobility Screen- How much help from another person do you currently need.....    Turning from your back to your side while in a a flat bed without using rails? 3 - A Little - Requires a little help (supervision, minimal assistance). Can use assistive devices.     Moving from lying on your back to sitting on the side of a flat bed without using bed rails? 3 - A Little - Requires a little help (supervision, minimal assistance). Can use assistive devices.     Moving to and from a bed to a chair (including a wheelchair)? 1 - Total - Requires total assistance or cannot do it at all.     Standing up from a chair using your arms(e.g., wheelchair or bedside chair)? 1 - Total - Requires total assistance or cannot do it at all.     To walk in a hospital room? 1 - Total - Requires total assistance or cannot do it at all.     Climbing 3-5 steps with a railing? 1 - Total - Requires total assistance or cannot do it at all.     AMPAC Mobility Score 10     TARGET Highest Level of Mobility Mobility Level 4, Transfer to chair        Clinical Impression    Rehab Diagnosis 55 y.o. 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 Patient was at his oncology outpatient clinic visit on 2/1 and exam was significant for necrotic mass surrounding the trach stoma causing significantly open wound c/f tumor encroachment. Unable to lay flat to get imaging. Transferred to [facility] 2/4 for trach management. ENT scoped and found partial obstruction, so trach changed to a Bivona, bypassing the obstruction. [place] showed extensive disease, with mets and lymphadenopathy. He was transferred to the SICU on night of 2.9.2022 due to mucous plugging and repeated RRT / TAART on the floor secondary to airway plugging (at least 1 daily for 5 days prior to SICU admission) and need for mechanical ventilator through his existing trach. Now on trach mask.     Initial Assessment pt presents with deconditioning, dec balance, dec endurance impairing mobility and function. able to transfer OOB to chair with 2 assist. good strength, fatigues quickly. will benefit from continued PT interventions to maximize mobility and functional potential.     Demonstrates Need for Referral to Another Service occupational therapy        Patient/Family Stated Goals    Patient/Family Stated Goal(s) unable to state        Frequency/Equipment Recommendations    PT Frequency 3x per week     What day of week is next treatment expected?  Tuesday        PT Recommendations for Inpatient Admission    Activity/Level of Assist out of bed;transfers only;assist of 2;with hand held assist        Planned Treatment / Interventions    General Treatment / Interventions Aerobic Capacity / Endurance Conditioning;Discharge Planning;Interdisciplinary Communication;Therapeutic Exercise;Positioning     Training Treatment / Interventions Balance / Investment banker, operational;Endurance Training;Functional Mobility Training     Education Treatment / Interventions Patient Education / Training        PT Discharge Summary    Physical Therapy Disposition Recommendation Rehab at [facility]     Equipment Recommendations for Discharge To be determined pending progress                     Problem: Physical Therapy Goals  Goal: Physical Therapy Goals  Description: PT GOALS  1. Patient will perform bed mobility with supervision   2. Patient will perform transfers with minimal assist using appropriate assistive device   3. Patient will ambulate a minimum of 100 feet with minimal assist using appropriate assistive device    Outcome: Initial problem identification

## 2020-10-19 NOTE — Plan of Care
[facility]			Location: 62 SICU/7112-A54 y.o., male				Attending: Alric Quan, MD	Admit Date: 10/09/2020			[id number] LOS: 10 days NUTRITION NOTEDiet order: Diabetisource at 65ml/hr + 2 [facility] consult received. Chart reviewed noting Prosource order needing adjustment. Pt remains off Propofol. Previous RD recs for continuous TFs off Propofol were Diabetisource at 28ml/hr with 1 Prosource BID (see note for add'l details). Per protocol will adjust Prosource to 1 packet BID. RD to continue to follow. Electronically signed by Woodfin Ganja, RD, October 19, 2020 X coverage for Suzie Portela, RD; [facility]: [phone] note: Nutrition is a consult only service on weekends and holidays.  Please enter a consult in EPIC if assistance is needed on a weekend or holiday or via [facility] [phone] to contact the covering RD.

## 2020-10-19 NOTE — Progress Notes
Surgical Intensive Care Unit  AttendingI have personally performed a face to face diagnostic evaluation on this patient, reviewed the chart, available data and care plan and have personally formulated the plan outlined below. I have seen this patient in addition to the advanced practitioner given patient's complexity and associated need for critical care. 55 y.o. male admitted on 10/09/2020 for respiratory failure secondary to airway obstruction due to advanced laryngeal Ca. He underwent trach change on 2/5. Subsequently had 2 episodes of RRT and mucus plugging. Transitioned to [facility] [place] trach. Extubated 2/12. On Trach mask. Secretions improving. Hospital Day 10 Past Medical History: Diagnosis Date ? Diabetes mellitus (HC Code) (HC CODE)  ? Hypercholesteremia  ? Hypertension  No past surgical history on file.Scheduled Meds:Current Facility-Administered Medications Medication Dose Route Frequency Provider Last Rate Last Admin ? albuterol neb sol 2.5 mg/3 mL (0.083%) (PROVENTIL,VENTOLIN)  2.5 mg Nebulization Q6H WA [place], [place], [place]   2.5 mg at 10/19/20 1502 ? gabapentin (NEURONTIN) solution 100 mg  100 mg Per NG tube Q8H Jennye Boroughs, PA   100 mg at 10/19/20 1145 ? insulin glargine (Semglee,Lantus) injection 20 Units  20 Units Subcutaneous Daily Lisette Abu, [place]   20 Units at 10/19/20 1145 ? insulin regular human (HumuLIN R, NovoLIN R) 100 unit/mL injection 28 Units  28 Units Subcutaneous Q6H [place], [place], [place]     ? insulin regular human (HumuLIN R, NovoLIN R) Sliding Scale (See admin instructions for dose)   Subcutaneous Q6H Jennye Boroughs, [place]   3 Units at 10/19/20 1145 ? LORazepam (ATIVAN) 2 mg/mL injection          ? [Held by provider] polyethylene glycol (MIRALAX) packet 17 g  17 g Per G Tube Daily Jacqlyn Larsen, APRN     ? Prosource no carb 15 gram protein and 60 kcal  1 packet Per G Tube BID Lisette Abu, PA     ? rocuronium (ZEMURON) 10 mg/mL injection          ? rosuvastatin (CRESTOR) tablet 10 mg  10 mg Per G Tube Daily Jeanmarie Plant, MD   10 mg at 10/19/20 1610 ? sodium chloride 3 % nebulizer solution 4 mL  4 mL Nebulization Q6H WA [place], [place], [place]   4 mL at 10/19/20 1502 ? traZODone (DESYREL) tablet 25 mg  25 mg Per G Tube Nightly Jennye Boroughs, PA   25 mg at 10/18/20 2114 Continuous Infusions:PRN Meds: acetaminophen, albuterol, dextrose 50% in water (D50W), dextrose 50% in water (D50W), HYDROmorphone, LORazepam, oxyCODONE **OR** oxyCODONE, oxymetazolineVITALS:Temp (24hrs), Avg:37 ?C, Min:36.6 ?C, Max:37.3 ?CVitals:  10/19/20 1200 10/19/20 1300 10/19/20 1400 10/19/20 1500 BP: (!) 170/81 (!) 141/81 (!) 167/105 (!) 154/90 Pulse: (!) 104 (!) 95 (!) 112 (!) 105 Resp: (!) 27 20 (!) 26 19 Temp: 37.2 ?C    TempSrc: Temporal    SpO2: 95% 94% 99% 98% Weight:     Height:     Intake/Output Summary (Last 24 hours) at 10/19/2020 1552Last data filed at 10/19/2020 1500Gross per 24 hour Intake 2280 ml Output 1450 ml Net 830 ml Physical Exam Anxious looking. On Trach mask.Blood tinged secretions noted Course BSLabs Last 24 hours:Most Recent Result Component Value Date/Time  Sodium 142 10/19/2020 04:55 AM  Potassium 4.3 10/19/2020 04:55 AM  Chloride 102 10/19/2020 04:55 AM  CO2 28 10/19/2020 04:55 AM  BUN 64 (H) 10/19/2020 04:55 AM  Creatinine 2.47 (H) 10/19/2020 04:55 AM  WBC 8.6 10/19/2020 04:55 AM  Hemoglobin 7.8 (L) 10/19/2020 04:55 AM  Hematocrit  25.80 (L) 10/19/2020 04:55 AM  Platelets 52 (L) 10/19/2020 04:55 AM  INR 0.98 10/19/2020 04:55 AM  PTT 25.4 10/19/2020 04:55 AM Imaging Last 24 hours:No results found.Assessment / Plan:Neurologic:  Pain is being controlled with the current regimen: Oxy+Dilaudid+ Tylenol.Anxiety being managed with Ativan. Increase dose. Add renal dose gabapentinCardiac: Heart rate: adequateblood pressure: >65MAP goal: >65Respiratory: Pulmonary insufficiency: On trach mask. Able to cough secretions. Case to be discussed in tumor board today. Not surgical candidate. May be offered radiotherapy and medical/palliative management.Wean O2 as tolerated.Continue nebs for secretion mobilization Infectious Disease/Sepsis:S/P MRSA pneumonia/ necrotic mass. Completed 7 days of antibx therapy. DC antibiotics today and Follow fever and WBC curves.  Renal, Fluids/Electrolytes: AKI, Acute renal failure - Cr 2.4. Thought to be related to vanco toxicity. Follow Cr trendAppropriate urine output.GI/Nutrition: Protein-calorie malnutrition - Albumin low.NPO. Continue J-tube feeds at goal. Hematology:Acute blood loss anemia.Transfuse for Hct <7Walstrom globunemia: no intervention.Plts -low. No need for transfusion. VTE prophylaxis with heparin SQ. He has RIJ thrombus. Not a candidate for full anticoagulation as he is likley to bleed from the tumor (already has blood tinged secretions)Endocrine:  Insulin protocol for hyperglycemiaMusculoskeletal:  PT/OT as able.Skin/wound: Frequent repositioning to mitigate against pressure ulcerationGoals of Care/Advance Directives:Code status: Full codePalliative care is consulted and involved in care.    Prophylaxis:  As abovePlan discussed with:  Primary Service, Critical Care Nursing, Respiratory Therapy, PharmacyCritical Care Time: 21 minThis patient is critically ill with the potential for rapid deterioration and/or mortality. This has required 31 minutes of my undivided attention over the last 24 hours. This time has been spent managing  anxiety, pain,   respiratory failure, AKI. interpreting bedside vital signs, fluid balance data and physical exam,  making appropriate management adjustments, ordering and interpreting appropriate laboratory testing, coordinating care with primary and/or consulting services.[name] MDSICU Attending

## 2020-10-19 NOTE — Plan of Care
Plan of Care Overview/ Patient Status    Hospital Medicine Brief Progress NotePt briefly discussed with covering provider.  Transfer requested yesterday, pt declined as there were concerns that pt would not be appropriate for the floor (see note by Jone Baseman MD).  On chart review today, pt accepted for transfer to NP15.  Hospital medicine will sign off.  Please call with any future questions or concerns.Carollee Herter, MD, PhD02/14/22 1:32 PM [facility] [phone]

## 2020-10-19 NOTE — Progress Notes
 [facility]	SICU Progress NoteAttending Provider: Alric Quan, MDPost-Operative Day/Post Injury Day Procedure(s):2/5: [name] exchange (ENT) Hospital LOS: 10 days Interim History:  HPI: 55 y.o. male admitted to the SICU on 2/9 for ventilator management following an RRT on the oncology floor for desaturation and increased work of breathing while on trach mask and ultimately placed back on vent. ?Patient was initially a transfer from [facility] on 2/4 after trach and PEG when he had a partial obstruction of trach site from a new necrotic mass. He underwent trach exchange 2/5. Was on trach mask on oncology floor however had 2 episodes on floor requiring RRT for lavage and likely mucous plugging. ?Course Complicated by: - Incidental L IJ thrombus - Multiple episodes of presumed mucous plugging requiring RRT's ?PMH: Chronic ITP, Waldenstrom macroglobulinemia, HTN. HLD, DM, tobacco and alcohol use, Laryngeal SCC (weekly Cisplatin and RT)?PSH: None known ?Home meds: Lipitor, Pepcid, Lisinopril, Metformin, Oxycodone, miralax, Insulin U100 and Lantus ?SICU course:2/9: Arrives to SICU HDS, on vent PCV. Appears well. Transitioned to Pressure support on vent and tolerating well. O/N: Episode of patient feeling like not getting a full breath, extremely tachypenic and increased work of breathing and tachycardia, attempted suctioning and lavage without improvement, attempted to switch to PCV and VAC but due to dyssynchrony unable to tolerate, no desat but poor ventilation and TV and ETCO2 up to 100. ENT at bedside do not feel additional scope would benefit as he was just scoped a few hours ago, ultimately gave fentanyl push and started propofol for vent synchrony but despite max prop still dyssynchronous. Gave one dose of roccuronium once sedated for vent synchrony with good effect. Albuterol and HTS neb given and peaks improved from 50 to 30's. No further paralytic needed and now synchronous on VAC. Kept on propofol for vent synchrony. CXR reassuring and back to GCS 11T. Hypotension only following sedation and Rocc, Low dose Neo started. 2/10: Kept on higher sedation in AM for [place] A/P (for staging) and soft tissue neck. Weaning sedation and will plan for PSV trial once more awake. Scoped by ENT in AM, airway patent. Q1-2hr suction requirements of thick tan secretions. No further episodes of respiratory distress although sedation remains high. Tube feeds changed to continuous given significant hyperglycemia. Insulin regimen changed per diabetes team recommendations. Pressure ulceration noted around tracheostomy --> ENT at bedside to evaluation, likely some component of invasive tumor growth. Palliative care consult for assist with symptom management and family moving forward. Primary oncologist office made aware of referral. O/N: weaning down on Prop, no episodes of respiratory distress or desats overnight. Febrile to 101.3, sputum and blood cultures ordered. 500cc LR bolus given for some oliguria and rising Cr. 2/11: Given 500cc NS for oliguria with good response. Vanco level yesterday 25, likely cause of worsening aki, on hold. UOP & lytes remains stable. Insulin adjusted per diabetes team. PSV as tolerated (doing well). CPT added. Confirmed with ENT pulm recs for blue portex suctionaid trach- no plans to switch at this time. Bowel regimen added. Plan keep on neo if low dose but if inc, consider changing to levo as likely then not in setting of sedation. Family updated on phone. Confirmed w heme- no AC rec for R IJ thrombus until plt > 50. Was going to attempt to place pt on TM overnight as he did very well on PSV x 3hrs, however, pt had anxiety attack regarding his cancer and prognosis, he requested to sleep during night and was agreeable to TM in  am. O/N: Patient agrees to PSV overnight which he did very well on. Agreeable to getting OOB to chair and possibly trach mask in AM but wants to stay on vent tonight. Off Neo by AM.  2/12: trial trach mask as tolerated this AM. Adjusted insulin regimen per the diabetes management service. Sputum now with staph, awaiting sensitivities. More loose stools today; nursing placed rectal tube and held bowel regimen.  ON: Anxious at times, feeling like he cannot breath but doing well on TM. Frequent suctioning. Increased standing. FIO2 increased to 40%.  insulin to 14U per endo reccs. 2/13: continue to adjust insulin regimen, now 20u regular q6. Cr continues to uptrend 2.57 --> 2.63 and repeat vanco level still elevated 24.6; pharmacy to continue dosing adjustments. Otherwise, remains on trach mask. Sputum with MRSA --> d/c ceftriaxone. ON: increased standing insulin to 24U. 2/14: Will discuss [facility] plan with hematology now that PLT > 50 x2 checks. Will also d/c vancomycin today (10d of therapy thus far). AKI appears to have peaked at 2.57 -> 2.63 -> 2.47. Awaiting transfer to the oncology service on NP 15. Okay to start [facility], but will hold off today given more bloody secretions.Review of Allergies/Meds/Hx: I have reviewed the patient's: allergies, past medical history, past surgical history, family history, social history, prior to admission medicationsObjective: Vitals:I have reviewed the patient's current vital signs as documented in the EMRLast 24 hours: Temp:  [97.9 ?F (36.6 ?C)-99.32 ?F (37.4 ?C)] 97.9 ?F (36.6 ?C)Pulse:  [90-112] 107Resp:  [19-32] 22BP: (113-188)/(63-101) 152/85SpO2:  [94 %-100 %] 100 %O2 Sat and Device used: SpO2: 100 %Device (Oxygen Therapy): tracheostomy collarI/O's:I have reviewed the patient's current I&O's as documented in the EMR.Gross Totals (Last 24 hours) at 10/19/2020 0515Last data filed at 10/19/2020 0500Intake 2185 ml Output 2035 ml Net 150 ml Physical ExamConstitutional:     General: He is not in acute distress. Appearance: He is not ill-appearing or toxic-appearing. HENT:    Head: Normocephalic and atraumatic.    Right Ear: External ear normal.    Left Ear: External ear normal.    Nose: No congestion or rhinorrhea.    Mouth/Throat:    Mouth: Mucous membranes are moist. Eyes:    Extraocular Movements: Extraocular movements intact.    Pupils: Pupils are equal, round, and reactive to light. Neck:    Comments: Tracheostomy in place Cardiovascular:    Rate and Rhythm: Regular rhythm. Tachycardia present.    Heart sounds: No murmur heard. Pulmonary:    Breath sounds: No wheezing, rhonchi or rales.    Comments: Slight tachypnea Abdominal:    General: There is no distension.    Tenderness: There is no abdominal tenderness.    Comments: PEG tube in place  Genitourinary:Musculoskeletal:    Cervical back: Normal range of motion.    Right lower leg: No edema.    Left lower leg: No edema. Skin:   General: Skin is warm and dry.    Capillary Refill: Capillary refill takes less than 2 seconds. Neurological:    General: No focal deficit present.    Mental Status: He is alert.    Cranial Nerves: No cranial nerve deficit.    Motor: No weakness. Labs: Refer to EMR. I have reviewed the patient's labs within the last 24 hours; see assessment and plan below for significant abnormals.Microbiology:Refer to EMR. I have reviewed all new results within the last 24 hours; see assessment and plan below.Diagnostics:Refer to EMR. I have reviewed all new results within the last 24 hours; see  assessment and plan below.Chest ZO:XWRUE to EMR. I have reviewed all new results within the last 24 hours; see assessment and plan below.Abdominal AV:WUJWJ to EMR. I have reviewed all new results within the last 24 hours; see assessment and plan below.ECG/Tele Events: I have reviewed the patient's ECG and telemetry as resulted in the EMR.Assessment/Plan Neurologic: Analgesia/ Anxiolysis - s/p precedex gtt- Tylenol 650 Q6 PRN - Start gabapentin at reduced dose given AKI- Give 2mg  Iv ativan prior to XRT (ordered for 2/15 AM at 0700)- Oxy 5 / 10 SS PRN - Ativan 1mg  Q6 PRN for anxiety - Trazodone 25mg  Qhs PRN ?ENT: L Laryngeal SCC, invading tracheostomy stoma: s/p trach exchange on 10/10/20 and biopsy on 10/12/20- Bivona trach to bypass obstruction - No surgical / curative options at this time- Plan for palliative radiation 2/15 with eventual bridge to chemo - Will have tumor board evaluation on Monday 2/14- Palliative care consult placed ?Cardiac: Hypotension, directly related to starting sedation but given febrile, could also be septic-->Resolved-  S/p phenylephrine gtt Hx of HTN/ HLD- Maintain continuous telemetry- Holding home lisinopril given AKI- Holding home lasix given AKI- Continue home crestor ?Respiratory:  Respiratory failure in the setting of mucous plugging and tumor invasion of tracheal stoma- High secretion burden and frequent mucous plugging on floor, now improved- Trach mask as tolerated- Tracheal suctioning and lavaging as needed - Albuterol + HTS nebs + CPT Q6WA- Atropine 2mg  SL Q4 for secretions  - Interventional pulm following?GI/Nutrition: - NPO, Diabetisource @ 95cc - PEG tube - Nutrition followingLoose stools (resolved)- Bowel regimen on hold- Consider checking a c. dif is still high output - s/p rectal tubePUD prophylaxis- No acute indicaition?Renal/Fluids/Lytes: AKI on CKD: Baseline Cr (1.5-1.6)Likely iso supRAtherapeutic vanco level- Renal U/S with no hydronephrosis- Voiding spontaneously- Mg/K sliding scales- Consider nephrology consult if Cr continues to uptrend significantly?Infectious Disease: MRSA PNA, concern for infected necrotic mass?- s/p ceftriaxone (2/10- 2/13)- d/c Vanco (2/5- 2/14)	- Follow levels given concern for AKI- s/p Zosyn - 2/11 sputum - MRSA- 2/11 BCx NGTD?Hematology:Acute blood loss anemia- Hgb >7?Hx of chronic ITP, Waldenstrom macroglobulinemia- Transfuse platelets <50 if bleeding, <10 if no active bleeding- Hold AC until platelets > 50	- Recheck on 2/14 and d/w hematology if [facility] should be restarted --> when appropriate, start heparin gtt (may consider switching to LMWH if no signs of bleeding from necrotic mass and CrCl improves)- Consider dexamethasone / IVIG for ITP if PLT < 20- Trend daily hemolysis retic panel, LDH, haptoglobin- Trend daily DIC markers: PT / PTT / INR, / fibrinogen- Appreciate hematology recommendations?R IJ Thrombus: incidentally found on - BUE dopplers with no DVT- s/p high risk heparin gtt (discontinued 2/8 given thrombocytopenia)- Anticoagulation as aboveDVT prophylaxis- Maintain SCDs at all times- HSQ for now?Endocrine: Hx DM II on oral agents and insulin - Insulin regimen Per Diabetes team 	- Lantus 20u QD @ noon	- MDISS	- Standing regular insulin 24u q6?Musculoskeletal: Generalized deconditioning- OOB daily, PT/OT evals- Turn and reposition Q2 hours, PUP dressing?Device Review:10/14/2020 - Bivona trach- PIV's - PEG- R SC port?Code Status / Dispo:- Full code- Accepted to NP15 by the oncology serviceNotifications : For questions please call SICU Covering Provider in Epic EMR (primary) SICU M[facility] Dynamic Roles (secondary)YSC 7-1 APP			[phone] 6-1 Frontside		[phone] 6-1 Backside		[phone] Verdi-2 APP			[phone]:Natalyia Innes, PA-CSICU APPMHB [phone]/14/2022

## 2020-10-19 NOTE — Progress Notes
Inpatient Diabetes Management Team Follow-up Note    Over the past 24 hours, the patient's glucose control has been variable but close to goal     ? Current Nutritional Status:  Diabetisource @ 95 ml/hr   ? Current Anti-hyperglycemic Regimen:  glargine 20 unit q noon, regular insulin 24 units q 6 hours, regular insulin LOW dose sliding scale q 6 hours  ?  Home Anti-hyperglycemic Regimen: Toujeo U300 64 units daily, Apidra 20 units TID with meals, metformin 1000 mg bid   ? Other Contributing Medications: D51/2NS-- dc'd at 1am     PE:    Temp:  [97.9 ?F (36.6 ?C)-99.32 ?F (37.4 ?C)] 97.9 ?F (36.6 ?C)  Pulse:  [93-112] 97  Resp:  [19-32] 20  BP: (107-188)/(65-101) 137/66  SpO2:  [94 %-100 %] 98 %  Device (Oxygen Therapy): tracheostomy collar  O2 Flow (L/min):  [10] 10     Wt Readings from Last 3 Encounters:   10/19/20 (!) 139.7 kg   10/06/20 (!) 139.5 kg   09/29/20 (!) 142.4 kg       Data Review:  BGs:      Creatinine (mg/dL)   Date Value   03/47/4259 2.47 (H)     Hemoglobin A1c (%)   Date Value   10/10/2020 9.0 (H)       Assessment:  55 yo h/o type 2 diabetes, HTN, Waldenstroms macroglobulinemia, ETOH, laryngeal SCC s/p trach/PEG presented with SOB significant for tumor invading the trach stoma with partial obstruction.  Diabetes team consulted for assistance in glycemic management.      BG over the past 24 hours have been above goal.  He continues on continuous tube feeds with high insulin requirements.  He has no insulin requirements off the tube feeds and would recommend to continue the current glargine dose.  His standing regular insulin was increased this am, and would reassess on the current dose and would increase the correction scale insulin mid dose correction.  Discussed plan with SICU covering provider. Based on current AACE/ADA guidelines, the goal is to maintain all BGs<180 mg/dl, ideally with pre-prandial BGs<140 mg/dl.    Recommendations:   1. Continue glargine 20 units q noon   2. Continue regular insulin 24 units q 6 hours, if BG remains > 200 then increase to 28 units q 6 hours  3. Increase to Regular insulin mid dose sliding scale q 6 hours   4. Continue blood glucose monitoring q 6 hours   5. Diet: tube feeds per nutrition       Please let us know if you have any questions or concerns about our recommenations. We will continue to follow the patient along with you during his hospitalzation as we attempt to optimize his glycemic control.  Detailed recommendations were communicated to the primary team both verbally and/or through this written note.  I spent a total of 25 minutes with the patient of which 15 minutes (over 50%) was spent in counseling the patient regarding diabetes management and coordinating care with the primary team.     Signed:  Yevonne Aline PA   Inpatient Diabetes Management Team   [facility]: [phone]  Diabetes consult pager # [phone]      NOTE: During nights, weekends, and holidays, please contact the on-call Endocrine Fellow at pager [phone].

## 2020-10-20 ENCOUNTER — Ambulatory Visit: Admit: 2020-10-20 | Payer: PRIVATE HEALTH INSURANCE

## 2020-10-20 ENCOUNTER — Ambulatory Visit: Admit: 2020-10-20 | Payer: PRIVATE HEALTH INSURANCE | Attending: Radiation Oncology

## 2020-10-20 ENCOUNTER — Encounter: Admit: 2020-10-20 | Payer: PRIVATE HEALTH INSURANCE | Attending: Hematology & Oncology

## 2020-10-20 DIAGNOSIS — J9601 Acute respiratory failure with hypoxia: Secondary | ICD-10-CM

## 2020-10-20 LAB — CBC WITH AUTO DIFFERENTIAL
BKR WAM ABSOLUTE IMMATURE GRANULOCYTES.: 0.05 x 1000/ÂµL (ref 0.00–0.30)
BKR WAM ABSOLUTE LYMPHOCYTE COUNT.: 0.53 x 1000/ÂµL — ABNORMAL LOW (ref 0.60–3.70)
BKR WAM ABSOLUTE NRBC (2 DEC): 0 x 1000/ÂµL (ref 0.00–1.00)
BKR WAM ANALYZER ANC: 6.85 x 1000/ÂµL (ref 2.00–7.60)
BKR WAM BASOPHIL ABSOLUTE COUNT.: 0.02 x 1000/ÂµL (ref 0.00–1.00)
BKR WAM BASOPHILS: 0.2 % (ref 0.0–1.4)
BKR WAM EOSINOPHIL ABSOLUTE COUNT.: 0.32 x 1000/ÂµL (ref 0.00–1.00)
BKR WAM EOSINOPHILS: 3.7 % (ref 0.0–5.0)
BKR WAM HEMATOCRIT (2 DEC): 24.7 % — ABNORMAL LOW (ref 38.50–50.00)
BKR WAM HEMOGLOBIN: 7.7 g/dL — ABNORMAL LOW (ref 13.2–17.1)
BKR WAM IMMATURE GRANULOCYTES: 0.6 % (ref 0.0–1.0)
BKR WAM LYMPHOCYTES: 6.1 % — ABNORMAL LOW (ref 17.0–50.0)
BKR WAM MCH (PG): 27.8 pg (ref 27.0–33.0)
BKR WAM MCHC: 31.2 g/dL (ref 31.0–36.0)
BKR WAM MCV: 89.2 fL (ref 80.0–100.0)
BKR WAM MONOCYTE ABSOLUTE COUNT.: 0.98 x 1000/ÂµL (ref 0.00–1.00)
BKR WAM MONOCYTES: 11.2 % (ref 4.0–12.0)
BKR WAM MPV: 12.3 fL — ABNORMAL HIGH (ref 8.0–12.0)
BKR WAM NEUTROPHILS: 78.2 % — ABNORMAL HIGH (ref 39.0–72.0)
BKR WAM NUCLEATED RED BLOOD CELLS: 0 % (ref 0.0–1.0)
BKR WAM PLATELETS: 60 x1000/ÂµL — ABNORMAL LOW (ref 150–420)
BKR WAM RDW-CV: 15.9 % — ABNORMAL HIGH (ref 11.0–15.0)
BKR WAM RED BLOOD CELL COUNT.: 2.77 M/ÂµL — ABNORMAL LOW (ref 4.00–6.00)
BKR WAM WHITE BLOOD CELL COUNT: 8.8 x1000/ÂµL (ref 4.0–11.0)

## 2020-10-20 LAB — BASIC METABOLIC PANEL
BKR ANION GAP: 10 (ref 7–17)
BKR BLOOD UREA NITROGEN: 60 mg/dL — ABNORMAL HIGH (ref 6–20)
BKR BUN / CREAT RATIO: 26.2 — ABNORMAL HIGH (ref 8.0–23.0)
BKR CALCIUM: 9.8 mg/dL (ref 8.8–10.2)
BKR CHLORIDE: 105 mmol/L (ref 98–107)
BKR CO2: 31 mmol/L — ABNORMAL HIGH (ref 20–30)
BKR CREATININE: 2.29 mg/dL — ABNORMAL HIGH (ref 0.40–1.30)
BKR EGFR (AFR AMER): 36 mL/min/{1.73_m2} (ref >60)
BKR EGFR (NON AFRICAN AMERICAN): 30 mL/min/{1.73_m2} (ref >60)
BKR GLUCOSE: 147 mg/dL — ABNORMAL HIGH (ref 70–100)
BKR POTASSIUM: 4.5 mmol/L (ref 3.3–5.3)
BKR SODIUM: 146 mmol/L — ABNORMAL HIGH (ref 136–144)

## 2020-10-20 LAB — RETICULOCYTES
BKR WAM IRF: 29.1 % — ABNORMAL HIGH (ref 3.0–15.9)
BKR WAM RETICULOCYTE - ABS (3 DEC): 0.09 10Ë6 cells/uL (ref 0.023–0.140)
BKR WAM RETICULOCYTE COUNT PCT (1 DEC): 3.3 % — ABNORMAL HIGH (ref 0.6–2.7)
BKR WAM RETICULOCYTE HGB EQUIVALENT: 27.6 pg — ABNORMAL LOW (ref 28.2–35.7)

## 2020-10-20 LAB — MAGNESIUM: BKR MAGNESIUM: 2.5 mg/dL — ABNORMAL HIGH (ref 1.7–2.4)

## 2020-10-20 LAB — HAPTOGLOBIN: BKR HAPTOGLOBIN: 392 mg/dL — ABNORMAL HIGH (ref 30–200)

## 2020-10-20 LAB — FIBRINOGEN     (BH GH L LMW YH): BKR FIBRINOGEN LEVEL: 812 mg/dL — ABNORMAL HIGH (ref 194–448)

## 2020-10-20 LAB — PT/INR AND PTT (BH GH L LMW YH)
BKR INR: 1 (ref 0.87–1.14)
BKR PARTIAL THROMBOPLASTIN TIME: 24.2 s (ref 23.0–31.4)
BKR PROTHROMBIN TIME: 10.9 s (ref 9.6–12.3)

## 2020-10-20 LAB — IMMATURE PLATELET FRACTION (BH GH LMW YH)
BKR WAM IPF, ABSOLUTE: 3.6 x1000/ÂµL (ref <20.0)
BKR WAM IPF: 9.8 % — ABNORMAL HIGH (ref 1.2–8.6)

## 2020-10-20 LAB — PHOSPHORUS     (BH GH L LMW YH): BKR PHOSPHORUS: 3.9 mg/dL (ref 2.2–4.5)

## 2020-10-20 MED ORDER — OXYCODONE IMMEDIATE RELEASE 5 MG TABLET
5 mg | JEJUNOSTOMY | Status: DC | PRN
Start: 2020-10-20 — End: 2020-10-21
  Administered 2020-10-20 – 2020-10-21 (×2): 5 mg via JEJUNOSTOMY

## 2020-10-20 MED ORDER — DIAZEPAM 5 MG/ML INJECTION SYRINGE
5 mg/mL | Freq: Once | INTRAVENOUS | Status: CP
Start: 2020-10-20 — End: ?
  Administered 2020-10-20: 19:00:00 5 mL via INTRAVENOUS

## 2020-10-20 MED ORDER — LORAZEPAM 2 MG/ML INJECTION SOLUTION
2 mg/mL | Status: CP
Start: 2020-10-20 — End: ?
  Administered 2020-10-20: 20:00:00 2 mg/mL via INTRAVENOUS

## 2020-10-20 MED ORDER — ZINC OXIDE 40 % TOPICAL OINTMENT
40 % | Freq: Every day | TOPICAL | Status: DC | PRN
Start: 2020-10-20 — End: 2020-11-17
  Administered 2020-10-21 – 2020-10-31 (×5): 40 % via TOPICAL

## 2020-10-20 MED ORDER — OXYCODONE IMMEDIATE RELEASE 5 MG TABLET
5 mg | JEJUNOSTOMY | Status: DC | PRN
Start: 2020-10-20 — End: 2020-10-21

## 2020-10-20 MED ORDER — LORAZEPAM 2 MG/ML INJECTION SOLUTION
2 mg/mL | Freq: Once | INTRAVENOUS | Status: CP
Start: 2020-10-20 — End: ?
  Administered 2020-10-20: 20:00:00 2 mL via INTRAVENOUS

## 2020-10-20 MED ORDER — HEPARIN (PORCINE) 5,000 UNIT/ML INJECTION SOLUTION
5000 unit/mL | Freq: Three times a day (TID) | SUBCUTANEOUS | Status: DC
Start: 2020-10-20 — End: 2020-10-21
  Administered 2020-10-20 – 2020-10-21 (×3): via SUBCUTANEOUS

## 2020-10-20 MED ORDER — LORAZEPAM 2 MG/ML INJECTION SOLUTION
2 mg/mL | Freq: Once | INTRAVENOUS | Status: CP
Start: 2020-10-20 — End: ?
  Administered 2020-10-20: 21:00:00 2 mL via INTRAVENOUS

## 2020-10-20 MED ORDER — HYDROMORPHONE 0.5 MG/0.5 ML INJECTION SYRINGE
0.5 mg/ mL | INTRAVENOUS | Status: DC | PRN
Start: 2020-10-20 — End: 2020-10-24
  Administered 2020-10-21: 05:00:00 0.5 mL via INTRAVENOUS

## 2020-10-20 MED ORDER — BANANA FLAKES-TRANSGALACTOOLIGOSACCHARIDE ORAL POWDER PACKET
Freq: Three times a day (TID) | ORAL | Status: DC
Start: 2020-10-20 — End: 2020-10-24
  Administered 2020-10-22 – 2020-10-24 (×7): via ORAL

## 2020-10-20 NOTE — Plan of Care
Plan of Care Overview/ Patient Status    .Pt A&Ox4. Using voice app for communication. PERRL. MAE. Anxious and tearful at times. Explaining interventions, POC, and answering pt's questions helps relieve anxiety. Ativan 1mg  with good effect. Pt c/o of pain to throat when coughing and with movement. Relieved with prn Oxycodone 10mg , Tylenol 650mg , and Dilaudid 0.5mg  IV. Afebrile. HR 95-130, NSR-sinus tachycardia. BP 97-160/58-80's. B LS diminished and coarse. Pt with productive cough but requires suctioning about every 4hours. Suctioned for small-moderate amount of thick, tan/pink secretions. Abd soft and non-tender. Hyperactive BS x4. Continent of small loose BM x1. TF continue at goal rate via PEG tube. Residuals <77ml. Self voiding clear/yellow urine in urinal, see I&O's for details. T&P q2hours. All interventions and POC explained to pt. See flowsheets for further information.

## 2020-10-20 NOTE — Progress Notes
RADIATION THERAPY NOTE2/15/2022 Treatment 0  of 6 delivered today. Radiation Therapist Comments: Patient unable to tolerate radiationRadiation Therapy Nurse Comments:Arthur Longo3:48 PMAttempted to give Radiation twice today. Given 2mg  IV ativan at 7am and came for radiation at 8am. Unable to tolerate recumbent position or mask. Patient asked to try again later. Given 10mg  IV valium at 2:15pm and came down to our department where he was given 2mg  IV ativan and able to get into a recumbent position. Became more anxious and hyperventilating (though O2 sat still at 98-99%) and given another 1mg  IV ativan. Unfortunately, he was not able to tolerate recumbent position for more than or the immobilization mask. He did not receive treatment. Dr Maple Hudson will see [name] tomorrow morning 2/16 to discuss implications. I spoke with his niece and sister and encouraged goals of care discussion with palliative care. Asa Saunas, MDTherapeutic RadiologyPGY-5 Resident PhysicianYNHH #: [phone] [facility] #: [phone]/15/2022

## 2020-10-20 NOTE — Progress Notes
Inpatient Diabetes Management Team Follow-up Note    Over the past 24 hours, the patient's glucose control has been variable but close to goal     ? Current Nutritional Status:  Diabetisource @ 95 ml/hr   ? Current Anti-hyperglycemic Regimen:  glargine 20 unit q noon, regular insulin 24 units q 6 hours, regular insulin MID dose sliding scale q 6 hours  ?  Home Anti-hyperglycemic Regimen: Toujeo U300 64 units daily, Apidra 20 units TID with meals, metformin 1000 mg bid   ? Other Contributing Medications: D51/2NS-- dc'd at 1am     PE:    Temp:  [97.1 ?F (36.2 ?C)-98.9 ?F (37.2 ?C)] 97.1 ?F (36.2 ?C)  Pulse:  [95-112] 110  Resp:  [15-38] 38  BP: (97-170)/(58-105) 127/80  SpO2:  [94 %-100 %] 100 %  Device (Oxygen Therapy): tracheostomy collar  O2 Flow (L/min):  [10] 10     Wt Readings from Last 3 Encounters:   10/20/20 (!) 150 kg   10/06/20 (!) 139.5 kg   09/29/20 (!) 142.4 kg       Data Review:  BGs:      Creatinine (mg/dL)   Date Value   16/06/9603 2.29 (H)     Hemoglobin A1c (%)   Date Value   10/10/2020 9.0 (H)       Assessment:  55 yo h/o type 2 diabetes, HTN, Waldenstroms macroglobulinemia, ETOH, laryngeal SCC s/p trach/PEG presented with SOB significant for tumor invading the trach stoma with partial obstruction.  Diabetes team consulted for assistance in glycemic management.      BG over the past 24 hours have been variable with the most recent BG now at goal.  He remains on continuous tube feeds at goal tube feed rate. Would continue with the current standing and glargine dose.  His insulin requirements off tube feeds are minimal so would not increase the glargine dose to avoid any fasting hypoglycemia in the event of stopping the tube feeds.   Discussed plan with SICU covering provider, Alyson Reedy, PA.  Based on current AACE/ADA guidelines, the goal is to maintain all BGs<180 mg/dl, ideally with pre-prandial BGs<140 mg/dl.    Recommendations:   1. Continue glargine 20 units q noon   2. Continue regular insulin 24 units q 6 hours -- HOLD if tube feeds stopped or discontinued   3. Continue regular insulin mid dose sliding scale q 6 hours   4. Continue blood glucose monitoring q 6 hours   5. Diet: tube feeds per nutrition       Please let us know if you have any questions or concerns about our recommenations. We will continue to follow the patient along with you during his hospitalzation as we attempt to optimize his glycemic control.  Detailed recommendations were communicated to the primary team both verbally and/or through this written note.  I spent a total of 20 minutes with the patient of which 15 minutes (over 50%) was spent in counseling the patient regarding diabetes management and coordinating care with the primary team.     Signed:  Yevonne Aline PA   Inpatient Diabetes Management Team   [facility]: [phone]  Diabetes consult pager # [phone]      NOTE: During nights, weekends, and holidays, please contact the on-call Endocrine Fellow at pager [phone].

## 2020-10-20 NOTE — Progress Notes
[place] [facility]	SICU Progress NoteAttending Provider: Alric Quan, MDPost-Operative Day/Post Injury Day Procedure(s):2/5: [name] exchange (ENT) Hospital LOS: 11 days Interim History:  HPI: 55 y.o. male admitted to the SICU on 2/9 for ventilator management following an RRT on the oncology floor for desaturation and increased work of breathing while on trach mask and ultimately placed back on vent. ?Patient was initially a transfer from [facility] on 2/4 after trach and PEG when he had a partial obstruction of trach site from a new necrotic mass. He underwent trach exchange 2/5. Was on trach mask on oncology floor however had 2 episodes on floor requiring RRT for lavage and likely mucous plugging. ?Course Complicated by: - Incidental L IJ thrombus - Multiple episodes of presumed mucous plugging requiring RRT's ?PMH: Chronic ITP, Waldenstrom macroglobulinemia, HTN. HLD, DM, tobacco and alcohol use, Laryngeal SCC (weekly Cisplatin and RT)?PSH: None known ?Home meds: Lipitor, Pepcid, Lisinopril, Metformin, Oxycodone, miralax, Insulin U100 and Lantus ?SICU course:2/9: Arrives to SICU HDS, on vent PCV. Appears well. Transitioned to Pressure support on vent and tolerating well. O/N: Episode of patient feeling like not getting a full breath, extremely tachypenic and increased work of breathing and tachycardia, attempted suctioning and lavage without improvement, attempted to switch to PCV and VAC but due to dyssynchrony unable to tolerate, no desat but poor ventilation and TV and ETCO2 up to 100. ENT at bedside do not feel additional scope would benefit as he was just scoped a few hours ago, ultimately gave fentanyl push and started propofol for vent synchrony but despite max prop still dyssynchronous. Gave one dose of roccuronium once sedated for vent synchrony with good effect. Albuterol and HTS neb given and peaks improved from 50 to 30's. No further paralytic needed and now synchronous on VAC. Kept on propofol for vent synchrony. CXR reassuring and back to GCS 11T. Hypotension only following sedation and Rocc, Low dose Neo started. 2/10: Kept on higher sedation in AM for [place] A/P (for staging) and soft tissue neck. Weaning sedation and will plan for PSV trial once more awake. Scoped by ENT in AM, airway patent. Q1-2hr suction requirements of thick tan secretions. No further episodes of respiratory distress although sedation remains high. Tube feeds changed to continuous given significant hyperglycemia. Insulin regimen changed per diabetes team recommendations. Pressure ulceration noted around tracheostomy --> ENT at bedside to evaluation, likely some component of invasive tumor growth. Palliative care consult for assist with symptom management and family moving forward. Primary oncologist office made aware of referral. O/N: weaning down on Prop, no episodes of respiratory distress or desats overnight. Febrile to 101.3, sputum and blood cultures ordered. 500cc LR bolus given for some oliguria and rising Cr. 2/11: Given 500cc NS for oliguria with good response. Vanco level yesterday 25, likely cause of worsening aki, on hold. UOP & lytes remains stable. Insulin adjusted per diabetes team. PSV as tolerated (doing well). CPT added. Confirmed with ENT pulm recs for blue portex suctionaid trach- no plans to switch at this time. Bowel regimen added. Plan keep on neo if low dose but if inc, consider changing to levo as likely then not in setting of sedation. Family updated on phone. Confirmed w heme- no AC rec for R IJ thrombus until plt > 50. Was going to attempt to place pt on TM overnight as he did very well on PSV x 3hrs, however, pt had anxiety attack regarding his cancer and prognosis, he requested to sleep during night and was agreeable to TM in  am. O/N: Patient agrees to PSV overnight which he did very well on. Agreeable to getting OOB to chair and possibly trach mask in AM but wants to stay on vent tonight. Off Neo by AM.  2/12: trial trach mask as tolerated this AM. Adjusted insulin regimen per the diabetes management service. Sputum now with staph, awaiting sensitivities. More loose stools today; nursing placed rectal tube and held bowel regimen.  ON: Anxious at times, feeling like he cannot breath but doing well on TM. Frequent suctioning. Increased standing. FIO2 increased to 40%.  insulin to 14U per endo reccs. 2/13: continue to adjust insulin regimen, now 20u regular q6. Cr continues to uptrend 2.57 --> 2.63 and repeat vanco level still elevated 24.6; pharmacy to continue dosing adjustments. Otherwise, remains on trach mask. Sputum with MRSA --> d/c ceftriaxone. ON: increased standing insulin to 24U. 2/14: Will discuss [facility] plan with hematology now that PLT > 50 x2 checks. Will also d/c vancomycin today (10d of therapy thus far). AKI appears to have peaked at 2.57 -> 2.63 -> 2.47. Awaiting transfer to the oncology service on NP 15. Okay to start [facility], but will hold off today given more bloody secretions. ON: NAE2/15: Plan to start palliative RT today. AM session aborted due to extreme anxiety (despite premedication with IV ativan), tried again in the PM with 10mg  IV Valium + 3mg  IV ativan, procedure again aborted due to extreme anxiety and inability to tolerate. Pending discussion with rad onc in AM re: utility of ongoing attempts at RT. Pending transfer to NP15 on med onc service. Review of Allergies/Meds/Hx: I have reviewed the patient's: allergies, past medical history, past surgical history, family history, social history, prior to admission medicationsObjective: Vitals:I have reviewed the patient's current vital signs as documented in the EMRLast 24 hours: Temp:  [97.1 ?F (36.2 ?C)-98.9 ?F (37.2 ?C)] 97.2 ?F (36.2 ?C)Pulse:  [93-112] 105Resp:  [15-31] 25BP: (97-170)/(58-119) 135/75SpO2:  [94 %-100 %] 96 %O2 Sat and Device used: SpO2: 96 %Device (Oxygen Therapy): tracheostomy collarI/O's:I have reviewed the patient's current I&O's as documented in the EMR.Gross Totals (Last 24 hours) at 10/20/2020 0522Last data filed at 10/20/2020 0300Intake 2090 ml Output 1800 ml Net 290 ml Physical ExamConstitutional:     General: He is not in acute distress.   Appearance: He is not ill-appearing or toxic-appearing. HENT:    Head: Normocephalic and atraumatic.    Right Ear: External ear normal.    Left Ear: External ear normal.    Nose: No congestion or rhinorrhea.    Mouth/Throat:    Mouth: Mucous membranes are moist. Eyes:    Extraocular Movements: Extraocular movements intact.    Pupils: Pupils are equal, round, and reactive to light. Neck:    Comments: Tracheostomy in place Cardiovascular:    Rate and Rhythm: Regular rhythm. Tachycardia present.    Heart sounds: No murmur heard. Pulmonary:    Breath sounds: No wheezing, rhonchi or rales.    Comments: Slight tachypnea Abdominal:    General: There is no distension.    Tenderness: There is no abdominal tenderness.    Comments: PEG tube in place  Genitourinary:Musculoskeletal:    Cervical back: Normal range of motion.    Right lower leg: No edema.    Left lower leg: No edema. Skin:   General: Skin is warm and dry.    Capillary Refill: Capillary refill takes less than 2 seconds. Neurological:    General: No focal deficit present.    Mental Status: He is alert.  Cranial Nerves: No cranial nerve deficit.    Motor: No weakness. Labs: Refer to EMR. I have reviewed the patient's labs within the last 24 hours; see assessment and plan below for significant abnormals.Microbiology:Refer to EMR. I have reviewed all new results within the last 24 hours; see assessment and plan below.Diagnostics:Refer to EMR. I have reviewed all new results within the last 24 hours; see assessment and plan below.Chest ZO:XWRUE to EMR. I have reviewed all new results within the last 24 hours; see assessment and plan below.Abdominal AV:WUJWJ to EMR. I have reviewed all new results within the last 24 hours; see assessment and plan below.ECG/Tele Events: I have reviewed the patient's ECG and telemetry as resulted in the EMR.Assessment/Plan Neurologic: Analgesia- Tylenol 650 Q6h PRN - Gabapentin 100mg  Q8h- Oxycodone 5 / 10mg  Q3h PRN - Dilaudid 0.5mg  Q4h PRN BreakthroughAnxiety- Ativan PO 1mg  Q6h PRN- Trazodone 25mg  QHS PRN- Will need premedication if ongoing attempts at RT (still extremely anxious despite 10mg  IV valium + 3mg  IV ativan)?ENT: L Laryngeal SCC, invading tracheostomy stoma: s/p trach exchange on 10/10/20 and biopsy on 10/12/20- Bivona trach to bypass obstruction - No surgical / curative options at this time- Planned for palliative radiation (2/15 - 2/21) with eventual bridge to chemo, though unable to tolerate RT; ongoing discussions with rad onc in AM- Palliative care following, appreciate input?Cardiac: Hx of HTN/ HLD- Maintain continuous telemetry- Holding home lisinopril given AKI- Holding home lasix given AKI- Continue home crestor ?Respiratory:  Respiratory failure in the setting of mucous plugging and tumor invasion of tracheal stoma, resolved- High secretion burden and frequent mucous plugging on floor, now improved- Trach mask as tolerated- Tracheal suctioning and lavaging as needed - Albuterol + HTS nebs + CPT Q6WA- Interventional pulm following?GI/Nutrition: - TF at goal via PEG- Holding bowel regimen given frequent bowel movementsPUD prophylaxis- No acute indicaition?Renal/Fluids/Lytes: AKI on CKD (baseline Cr 1.5-1.6), likely 2/2 supratherapeutic vanco level, resolving- Renal U/S with no hydronephrosis- Avoiding nephrotoxic meds- Voiding spontaneously- Mg/K sliding scales?Infectious Disease: MRSA PNA, resolved - S/p ceftriaxone (2/8 - 2/12)- S/p Vanco (2/5 - 2/14)?Hematology:Acute blood loss anemia- Hgb >7?Hx of chronic ITP, Waldenstrom macroglobulinemia- Transfuse platelets <50 if bleeding, <10 if no active bleeding- Holding A/C 2/2 thrombocytopenia, consider resuming high risk heparin gtt when appropriate - Appreciate hematology recommendations?DVT prophylaxis / R IJ Thrombus - BUE dopplers (2/7) with no DVT- RIJ thrombus incidentally noted on [place] (2/5, 2/10)- S/p high risk heparin gtt (discontinued 2/8 given thrombocytopenia)- Consider resuming high risk heparin gtt when appropriate and plt consistently >50 - HSQ for now- Maintain SCDs at all times?Endocrine: Hyperglycemia / Hx T2DM- Lantus 20u daily at noon- Regular insulin 24u Q6h, hold if TF are held- Regular MDISS- Holding home Metformin, Lantus 64u daily, Apidra 20u TID with meals- Appreciate diabetes team recsMusculoskeletal: Generalized deconditioning- OOB daily, PT/OT evals- Turn and reposition Q2 hours, PUP dressing?Device Review:- RIJ Port (08/12/20)- PEG (09/14/20)- Bivona trach (10/10/20)?Code Status / Dispo:- Full code - Accepted to NP15 by the oncology service- Appreciate palliative care assistance with GOC discussionsNotifications : For questions please call SICU Covering Provider listed in [facility] / EpicSigned:Korinne Greenstein, PA-C2/15/2022SICU [facility] Dynamic Roles (secondary)[facility] 7-1 APP			[phone]

## 2020-10-20 NOTE — Progress Notes
Otolaryngology Progress NoteLenwood [name] is a 55 y.o. 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, trach/PEG transferred from [place] due to SOB w/exam significant for tumor invading the trach stoma and scope showing partial obstruction.Interim History- NAEO, AFVSS- continues trach collar VitalsTemp:  [36.2 ?C-36.6 ?C] 36.6 ?CPulse:  [97-121] 110Resp:  [15-39] 23BP: (97-175)/(58-105) 175/93SpO2:  [94 %-100 %] 100 %Device (Oxygen Therapy): tracheostomy collarO2 Flow (L/min):  [10] 10I/O last 3 completed shifts:In: 2280 [NG/GT:2280]Out: 1800 [Urine:1800]Physical ExamGen: alert, on TM, NADEyes: open, normal external appearanceFace: grossly symmetricNose: clear anteriorlyOP/OC: hemostaticNeck: Bivona 8 cuffed secured w/soft trach ties, necrotic but hemostatic mass surrounding the stoma, remaining neck flat, firm tumor Pulm: no increased WOB on TMRecent Labs Lab 02/14/220455 02/14/220455 02/15/220606 WBC 8.6   < > 8.8 HGB 7.8*   < > 7.7* HCT 25.80*   < > 24.70* MCV 90.8   < > 89.2 PLT 52*  --  60*  < > = values in this interval not displayed. Recent Labs Lab 02/14/220455 02/14/221113 02/15/220606 02/15/220610 02/15/221112 NA 142  --  146*  --   --  K 4.3  --  4.5  --   --  CL 102  --  105  --   --  CO2 28  --  31*  --   --  BUN 64*  --  60*  --   --  CREATININE 2.47*  --  2.29*  --   --  GLU 365*   < > 147*   < > 158* CALCIUM 9.3  --  9.8  --   --  MG 2.4  --  2.5*  --   --  PHOS 4.4  --  3.9  --   --   < > = values in this interval not displayed. Recent Labs Lab 02/14/220455 02/14/220455 02/15/220606 PTT 25.4   < > 24.2 INR 0.98  --  1.00  < > = values in this interval not displayed. Recent Labs Lab 02/10/220630 ALT 17 AST 17 BILITOT 0.2 ALKPHOS 72 LIPASE 6* ALBUMIN 2.8* Invalid input(s): GLUMETAssessment and Plan54 M with PMH of HTN, HLD, DM, tobacco and EtOH use, Waldenstrom macroglobulinemia, 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 Admitted to SICU for ventilatory support. No acute ENT intervention planned with widely patent airway.- no ENT intervention planned at this time; airway widely patent- case discussed at tumor board 2/14; not a surgical candidate - accepted for transfer to NP15 by Medical Oncology service; after transfer, will follow via switch to ENT consult service - continue routine trach care with careful suctioning- ensure trach well secured with Karn Cassis, MDPGY-1, Anesthesiology

## 2020-10-20 NOTE — Progress Notes
Surgical Intensive Care Unit  AttendingI have personally performed a face to face diagnostic evaluation on this patient, reviewed the chart, available data and care plan and have personally formulated the plan outlined below. I have seen this patient in addition to the advanced practitioner given patient's complexity and associated need for critical care. 55 y.o. male admitted on 10/09/2020 for respiratory failure secondary to airway obstruction due to advanced laryngeal Ca. He underwent trach change on 2/5. Subsequently had 2 episodes of RRT and mucus plugging. Transitioned to [facility] trach. Extubated 2/12. On Trach mask. Secretions improving. Hospital Day 11 Accepted by medical Onc. Awaiting transfer. Did not tolerate Radiation treatment today. Past Medical History: Diagnosis Date ? Diabetes mellitus (HC Code) (HC CODE)  ? Hypercholesteremia  ? Hypertension  No past surgical history on file.Scheduled Meds:Current Facility-Administered Medications Medication Dose Route Frequency Provider Last Rate Last Admin ? albuterol neb sol 2.5 mg/3 mL (0.083%) (PROVENTIL,VENTOLIN)  2.5 mg Nebulization Q6H WA [place], [place], [place]   2.5 mg at 10/20/20 1312 ? diazePAM (VALIUM) syringe 10 mg  10 mg IV Push Once Alyson Reedy, PA     ? gabapentin (NEURONTIN) solution 100 mg  100 mg Per NG tube Q8H [place], [place], PA   100 mg at 10/20/20 1610 ? heparin (porcine) injection 7,500 Units  7,500 Units Subcutaneous Q8H Alyson Reedy, PA     ? insulin glargine (Semglee,Lantus) injection 20 Units  20 Units Subcutaneous Daily Lisette Abu, PA   20 Units at 10/20/20 1231 ? insulin regular human (HumuLIN R, NovoLIN R) 100 unit/mL injection 24 Units  24 Units Subcutaneous Q6H Lisette Abu, [place]   24 Units at 10/20/20 1232 ? insulin regular human (HumuLIN R, NovoLIN R) Sliding Scale (See admin instructions for dose)   Subcutaneous Q6H Lisette Abu, PA   2 Units at 10/20/20 1232 ? [Held by provider] polyethylene glycol (MIRALAX) packet 17 g  17 g Per G Tube Daily Jacqlyn Larsen, APRN     ? Prosource no carb 15 gram protein and 60 kcal  1 packet Per G Tube BID Lisette Abu, PA   1 packet at 10/20/20 9604 ? rocuronium (ZEMURON) 10 mg/mL injection          ? rosuvastatin (CRESTOR) tablet 10 mg  10 mg Per G Tube Daily Jeanmarie Plant, MD   10 mg at 10/20/20 5409 ? sodium chloride 3 % nebulizer solution 4 mL  4 mL Nebulization Q6H WA [place], [place], [place]   4 mL at 10/20/20 1313 ? traZODone (DESYREL) tablet 25 mg  25 mg Per G Tube Nightly Jennye Boroughs, PA   25 mg at 10/19/20 2131 Continuous Infusions:PRN Meds: acetaminophen, albuterol, dextrose 50% in water (D50W), dextrose 50% in water (D50W), HYDROmorphone, LORazepam, oxyCODONE **OR** oxyCODONE, oxymetazolineVITALS:Temp (24hrs), Avg:36.3 ?C, Min:36.2 ?C, Max:36.6 ?CVitals:  10/20/20 1200 10/20/20 1300 10/20/20 1317 10/20/20 1400 BP: (!) 175/93 (!) 181/92  130/76 Pulse: (!) 112 (!) 113 (!) 110 (!) 108 Resp: (!) 39 (!) 24 (!) 23 (!) 25 Temp: 36.6 ?C    TempSrc: Temporal    SpO2: 98% 99% 100% 99% Weight:     Height:     Intake/Output Summary (Last 24 hours) at 10/20/2020 1421Last data filed at 10/20/2020 1400Gross per 24 hour Intake 2042.5 ml Output 2300 ml Net -257.5 ml Physical Exam Anxious looking. On Trach mask.Blood tinged secretions noted Course BSLabs Last 24 hours:Most Recent Result Component Value Date/Time  Sodium 146 (H) 10/20/2020 06:06 AM  Potassium 4.5 10/20/2020  06:06 AM  Chloride 105 10/20/2020 06:06 AM  CO2 31 (H) 10/20/2020 06:06 AM  BUN 60 (H) 10/20/2020 06:06 AM  Creatinine 2.29 (H) 10/20/2020 06:06 AM  WBC 8.8 10/20/2020 06:06 AM  Hemoglobin 7.7 (L) 10/20/2020 06:06 AM  Hematocrit 24.70 (L) 10/20/2020 06:06 AM  Platelets 60 (L) 10/20/2020 06:06 AM  INR 1.00 10/20/2020 06:06 AM  PTT 24.2 10/20/2020 06:06 AM Imaging Last 24 hours:No results found.Assessment / Plan:Neurologic:  Pain is being controlled with the current regimen: Oxy+Dilaudid+ Tylenol.Anxiety being managed with Ativan. Increase dose. Consider psych consult for the management of severe anxiety.renal dose gabapentinCardiac: Heart rate: adequateblood pressure: >65MAP goal: >65Respiratory: Pulmonary insufficiency: On trach mask. Able to cough secretions. Case to be discussed in tumor board today. Not surgical candidate.  radiotherapy and medical/palliative management.Wean O2 as tolerated.Continue nebs for secretion mobilization Infectious Disease/Sepsis:S/P MRSA pneumonia/ necrotic mass. Completed 7 days of antibx therapy.Renal, Fluids/Electrolytes: AKI, Acute renal failure - Cr 2.4. Thought to be related to vanco toxicity. Follow Cr trendAppropriate urine output.GI/Nutrition: Protein-calorie malnutrition - Albumin low.NPO. Continue J-tube feeds at goal. Hematology:Acute blood loss anemia.Transfuse for Hct <7Walstrom globunemia: no intervention.Plts -low. No need for transfusion. VTE prophylaxis with heparin SQ. He has RIJ thrombus. Not a candidate for full anticoagulation as he is likley to bleed from the tumor (already has blood tinged secretions)Endocrine:  Insulin protocol for hyperglycemiaMusculoskeletal:  PT/OT as able.Skin/wound: Frequent repositioning to mitigate against pressure ulcerationGoals of Care/Advance Directives:Code status: Full codePalliative care is consulted and involved in care.    Prophylaxis:  As abovePlan discussed with:  Primary Service, Critical Care Nursing, Respiratory Therapy, PharmacySubsequent Care Time:25 Silvano Bilis MDSICU Attending

## 2020-10-20 NOTE — Other
The patient was presented to the Head and neck tumor Board.  The scans and history were reviewed.  He has rapidly progressive carcinoma of the larynx.  It has extended outside the initial radiation field.  He has been seen by radiation oncology.  Radiation to the head neck region is scheduled to be initiated.The pathology was discussed.  He is PDL1 negative.  On combined testing is pending.  Testing for [facility] is pending.  If positive this would suggest an increased likelihood of resistance to cetuximab.  Once radiation is completed chemotherapy with Taxol with/without rituximab would be considered.  If the patient significantly deteriorates a goals of care discussion would be appropriate

## 2020-10-20 NOTE — Progress Notes
Short Progress NoteLenwood's case was discussed at tumor board. Plan is for palliative radiation starting 2/15 (5 fractions, with treatment end on 2/21). Recommendations- Please give 2mg  IV Ativan at 7am daily prior to RT (M-F 2/15-2/21) in order to facilitate patient ability to tolerate recumbent position and radiation mask) - Do NOT give radiosensitizing chemotherapy during radiation as we are giving high dose per fraction and there would be concern for undue toxicity. Asa Saunas, MDTherapeutic RadiologyPGY-5 Resident PhysicianYNHH #: [phone] [facility] #: [phone]/14/2022

## 2020-10-20 NOTE — Other
[facility]    [facility]     Palliative Care Follow-Up Note    Present History   Chart reviewed and case discussed with SICU team. Patient examined this morning. He is more alert and interactive than during prior examinations, able to respond to questions by communicating via text app. I reintroduced palliative care, describing our team's expertise in symptom management and providing support to patients and families as they cope with difficult illness. He denies pain. Has been intermittently having pain to the back of his neck, for which he has utilized both IV Dilaudid and oxycodone. He reports that he feels loopy right now, describes some of his medications as goofy. Received Ativan 2 mg earlier this morning prior to going for radiation but was unable to tolerate the procedure. Will receive Valium as premedication this afternoon on a second attempt, per SICU team. He reports that he has been able to text with his family and that [name] has been able to come visit. Asked how he is coping with everything and how he is feeling emotionally, Arthur [name] responded that he wanted ice chips. Similarly, when asked what was on his mind and if he had any concerns today, he reports feeling very dried out and wanting ice chips. He also notes that his breathing feels labored. Suctioning helps, but it is short-lived. He reports ongoing anxiety.     Case discussed with Radiation Oncology this afternoon, who report that despite high-dose anxiolytics, Arthur [name] was again unable to tolerate radiation on repeat attempt. Unfortunately, absent radiation, there are unlikely to be any further cancer-directed therapies available to him. Arthur [name] was reportedly too fatigued to engage in further GOC discussions this afternoon. Plan to readdress in coming days. Family aware.     Review of Allergies/Hx/Meds   Allergies  Patient has No Known Allergies.    Medical History  Past Medical History: Diagnosis Date   ? Diabetes mellitus (HC Code) (HC CODE)    ? Hypercholesteremia    ? Hypertension        Family History  Family History   Problem Relation Age of Onset   ? Aneurysm Mother        Social History  Social History     Socioeconomic History   ? Marital status: Legally Separated     Spouse name: Not on file   ? Number of children: Not on file   ? Years of education: Not on file   ? Highest education level: Not on file   Occupational History   ? Not on file   Tobacco Use   ? Smoking status: Former Smoker     Packs/day: 1.00     Years: 30.00     Pack years: 30.00     Quit date: 03/04/2013     Years since quitting: 7.6   Substance and Sexual Activity   ? Alcohol use: Yes     Alcohol/week: 14.0 standard drinks     Types: 14 Cans of beer per week   ? Drug use: Not on file   ? Sexual activity: Not on file   Other Topics Concern   ? Not on file   Social History Narrative   ? Not on file     Social Determinants of Health     Financial Resource Strain:    ? Difficulty of Paying Living Expenses:    Food Insecurity:    ? Worried About Programme researcher, broadcasting/film/video  in the Last Year:    ? Ran Out of Food in the Last Year:    Transportation Needs:    ? Lack of Transportation (Medical):    ? Lack of Transportation (Non-Medical):    Physical Activity:    ? Days of Exercise per Week:    ? Minutes of Exercise per Session:    Stress:    ? Feeling of Stress :    Social Connections:    ? Frequency of Communication with Friends and Family:    ? Frequency of Social Gatherings with Friends and Family:    ? Attends Religious Services:    ? Active Member of Clubs or Organizations:    ? Attends Banker Meetings:    ? Marital Status:    Intimate Partner Violence:    ? Fear of Current or Ex-Partner:    ? Emotionally Abused:    ? Physically Abused:    ? Sexually Abused:        InPatient Medications  Current Facility-Administered Medications   Medication Dose Route Frequency Last Rate   ? acetaminophen  650 mg Per G Tube Q6H PRN ? albuterol  2.5 mg Nebulization Q6H PRN     ? albuterol  2.5 mg Nebulization Q6H WA     ? dextrose 50% in water (D50W)  12.5 g IV Push Q15 MIN PRN     ? dextrose 50% in water (D50W)  25 g IV Push Q15 MIN PRN     ? diazePAM  10 mg IV Push Once     ? gabapentin  100 mg Per NG tube Q8H     ? heparin (porcine)  7,500 Units Subcutaneous Q8H     ? HYDROmorphone  0.5 mg IV Push Q4H PRN     ? insulin glargine  20 Units Subcutaneous Daily     ? insulin regular human  24 Units Subcutaneous Q6H     ? insulin regular human   Subcutaneous Q6H     ? LORazepam  1 mg Per G Tube Q6H PRN     ? oxyCODONE  5 mg Per J Tube Q3H PRN      Or   ? oxyCODONE  10 mg Per J Tube Q3H PRN     ? oxymetazoline  1 spray each nostril BID PRN     ? [Held by provider] polyethylene glycol  17 g Per G Tube Daily     ? amino acids-protein hydrolys  1 packet Per G Tube BID     ? rocuronium         ? rosuvastatin  10 mg Per G Tube Daily     ? sodium chloride  4 mL Nebulization Q6H WA     ? traZODone  25 mg Per G Tube Nightly         [email or web address]    Palliative Performance Scale 30%   (Modification of Karnofsky and was designed for measurement of physical status in Palliative Care. Only 10% of patients with PPS score <=50% would be expected to survive >6 months)    Physical Exam  Vitals and nursing note reviewed.   Constitutional:       Appearance: He is ill-appearing.      Comments: Trach mask   HENT:      Mouth/Throat:      Mouth: Mucous membranes are dry.   Eyes:      Pupils: Pupils are equal, round, and reactive to light.   Pulmonary:  Effort: Tachypnea and accessory muscle usage present.      Comments: Increased work of breathing, RR of approx. 40 breaths/min  Skin:     General: Skin is warm and dry.   Neurological:      Mental Status: He is alert.      Comments: Communicates via text app         Vitals:  Temp:  [97.1 ?F (36.2 ?C)-97.9 ?F (36.6 ?C)] 97.9 ?F (36.6 ?C)  Pulse:  [95-121] 112  Resp:  [15-39] 39  BP: (97-175)/(58-105) 175/93  SpO2:  [94 %-100 %] 98 %     Wt Readings from Last 3 Encounters:   10/20/20 (!) 150 kg   10/06/20 (!) 139.5 kg   09/29/20 (!) 142.4 kg       Review of Labs/Diagnostics  Complete Blood Count:  Recent Labs   Lab 10/18/20  0458 10/19/20  0455 10/20/20  0606   WBC 8.0 8.6 8.8   HGB 7.1* 7.8* 7.7*   HCT 23.30* 25.80* 24.70*   PLT 52* 52* 60*     Comprehensive Metabolic Panel:  Recent Labs   Lab 10/18/20  0458 10/18/20  1021 10/19/20  0455 10/19/20  1113 10/20/20  0606 10/20/20  0610 10/20/20  1112   NA 140  --  142  --  146*  --   --    K 3.9  --  4.3  --  4.5  --   --    CL 102  --  102  --  105  --   --    CO2 28  --  28  --  31*  --   --    BUN 65*  --  64*  --  60*  --   --    CREATININE 2.63*  --  2.47*  --  2.29*  --   --    GLU 333*   < > 365*   < > 147*   < > 158*   CALCIUM 8.8  --  9.3  --  9.8  --   --     < > = values in this interval not displayed.     Recent Labs   Lab 10/15/20  0630   ALKPHOS 72   BILITOT 0.2   PROT 5.1*   ALT 17   AST 17       Diagnostics:  No results found.      Impression/Plan:   Arthur [name] is a 55 y.o. 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 Admitted to SICU on 2/9 for ventilator management following RRT on oncology floor for desaturation and increased work of breathing while on trach mask, ultimately placed back on vent. Referred for Palliative Care Consult by Attending Provider: Alric Quan, MD [phone] for supportive care.    Cancer-related pain:  - Oxycodone 5 mg PO q3hr PRN moderate pain OR oxycodone 10 mg PO q3hr PRN severe pain  - Dilaudid 0.5 mg IV q4hr PRN pain refractory to oral regimen     Anxiety  - Ativan 1 mg per G tube q6hr PRN    Dyspnea  - Could broaden indication for PRN low-dose opioids to include dyspnea if within goals of care  - Anxiolytics as above also likely to provide relief of perception of breathlessness    Coping/Support:   -  Ongoing assessments for psychosocial and/or spiritual needs  - Full interdisciplinary team support available as needed  - Appreciate primary team advocating with nurse management for an exemption to visitor restrictions to allow 2 visitors, as patient's sister reports mobility challenges making visitation on her own difficult  - Appreciate primary team and nursing helping facilitate communication between patient and his sister and niece    Advance Care Planning:  - Full code  - No advance directives or health care proxy documentation on file  - His sister and niece are listed as emergency contacts and it appears that his sister is next of kin and default proxy decision maker in the event that patient lacks capacity    Goals of Care:  - Unable to re-address with patient today, discussion likely to progress in coming days  - Patient unable to tolerate palliative radiation. No cancer-directed therapies otherwise available.    Palliative Care will continue to follow for symptom management and support. We remain available to assist with goals of care discussions as patient's condition evolves.     Thank you for involving the Palliative Care team in the care of this patient. The Palliative Care team will continue to follow.    Patient was evaluated: in person    Time committed: > 25 minutes were spent in chart review, coordination of care, and at the bedside in patient/family counseling and education, including detailed concepts of symptom managment and goals of care/advanced care planning.    Signed:  Debbrah Alar, APRN  Palliative Care Consult Service  10/20/2020 1:08 PM

## 2020-10-21 ENCOUNTER — Inpatient Hospital Stay: Admit: 2020-10-21 | Payer: PRIVATE HEALTH INSURANCE

## 2020-10-21 ENCOUNTER — Ambulatory Visit: Admit: 2020-10-21 | Payer: PRIVATE HEALTH INSURANCE | Attending: Radiation Oncology

## 2020-10-21 ENCOUNTER — Ambulatory Visit: Admit: 2020-10-21 | Payer: PRIVATE HEALTH INSURANCE

## 2020-10-21 DIAGNOSIS — J9601 Acute respiratory failure with hypoxia: Secondary | ICD-10-CM

## 2020-10-21 DIAGNOSIS — C329 Malignant neoplasm of larynx, unspecified: Secondary | ICD-10-CM

## 2020-10-21 LAB — CBC WITH AUTO DIFFERENTIAL
BKR WAM ABSOLUTE IMMATURE GRANULOCYTES.: 0.07 x 1000/ÂµL (ref 0.00–0.30)
BKR WAM ABSOLUTE LYMPHOCYTE COUNT.: 0.38 x 1000/ÂµL — ABNORMAL LOW (ref 0.60–3.70)
BKR WAM ABSOLUTE NRBC (2 DEC): 0 x 1000/ÂµL (ref 0.00–1.00)
BKR WAM ANALYZER ANC: 7.32 x 1000/ÂµL (ref 2.00–7.60)
BKR WAM BASOPHIL ABSOLUTE COUNT.: 0.02 x 1000/ÂµL (ref 0.00–1.00)
BKR WAM BASOPHILS: 0.2 % (ref 0.0–1.4)
BKR WAM EOSINOPHIL ABSOLUTE COUNT.: 0.26 x 1000/ÂµL (ref 0.00–1.00)
BKR WAM EOSINOPHILS: 2.9 % (ref 0.0–5.0)
BKR WAM HEMATOCRIT (2 DEC): 28.2 % — ABNORMAL LOW (ref 38.50–50.00)
BKR WAM HEMOGLOBIN: 8.3 g/dL — ABNORMAL LOW (ref 13.2–17.1)
BKR WAM IMMATURE GRANULOCYTES: 0.8 % (ref 0.0–1.0)
BKR WAM LYMPHOCYTES: 4.3 % — ABNORMAL LOW (ref 17.0–50.0)
BKR WAM MCH (PG): 27 pg (ref 27.0–33.0)
BKR WAM MCHC: 29.4 g/dL — ABNORMAL LOW (ref 31.0–36.0)
BKR WAM MCV: 91.9 fL (ref 80.0–100.0)
BKR WAM MONOCYTE ABSOLUTE COUNT.: 0.77 x 1000/ÂµL (ref 0.00–1.00)
BKR WAM MONOCYTES: 8.7 % (ref 4.0–12.0)
BKR WAM MPV: 12 fL (ref 8.0–12.0)
BKR WAM NEUTROPHILS: 83.1 % — ABNORMAL HIGH (ref 39.0–72.0)
BKR WAM NUCLEATED RED BLOOD CELLS: 0 % (ref 0.0–1.0)
BKR WAM PLATELETS: 42 x1000/ÂµL — ABNORMAL LOW (ref 150–420)
BKR WAM RDW-CV: 15.9 % — ABNORMAL HIGH (ref 11.0–15.0)
BKR WAM RED BLOOD CELL COUNT.: 3.07 M/ÂµL — ABNORMAL LOW (ref 4.00–6.00)
BKR WAM WHITE BLOOD CELL COUNT: 8.8 x1000/ÂµL (ref 4.0–11.0)

## 2020-10-21 LAB — BASIC METABOLIC PANEL
BKR ANION GAP: 10 (ref 7–17)
BKR BLOOD UREA NITROGEN: 60 mg/dL — ABNORMAL HIGH (ref 6–20)
BKR BUN / CREAT RATIO: 26.5 — ABNORMAL HIGH (ref 8.0–23.0)
BKR CALCIUM: 10.1 mg/dL (ref 8.8–10.2)
BKR CHLORIDE: 104 mmol/L (ref 98–107)
BKR CO2: 33 mmol/L — ABNORMAL HIGH (ref 20–30)
BKR CREATININE: 2.26 mg/dL — ABNORMAL HIGH (ref 0.40–1.30)
BKR EGFR (AFR AMER): 37 mL/min/{1.73_m2} (ref >60)
BKR EGFR (NON AFRICAN AMERICAN): 30 mL/min/{1.73_m2} (ref >60)
BKR GLUCOSE: 275 mg/dL — ABNORMAL HIGH (ref 70–100)
BKR POTASSIUM: 4.3 mmol/L (ref 3.3–5.3)
BKR SODIUM: 147 mmol/L — ABNORMAL HIGH (ref 136–144)

## 2020-10-21 LAB — IMMATURE PLATELET FRACTION (BH GH LMW YH)
BKR WAM IPF, ABSOLUTE: 2.7 x1000/ÂµL (ref <20.0)
BKR WAM IPF: 6.4 % (ref 1.2–8.6)

## 2020-10-21 LAB — MAGNESIUM: BKR MAGNESIUM: 2.3 mg/dL (ref 1.7–2.4)

## 2020-10-21 LAB — C. DIFFICILE ASSAY
BKR C. DIFFICILE GDH ANTIGEN: NEGATIVE
BKR C. DIFFICILE RAPID TOXIN: NEGATIVE

## 2020-10-21 LAB — BLOOD CULTURE   (BH GH L LMW YH): BKR BLOOD CULTURE: NO GROWTH

## 2020-10-21 LAB — PHOSPHORUS     (BH GH L LMW YH): BKR PHOSPHORUS: 3.7 mg/dL (ref 2.2–4.5)

## 2020-10-21 MED ORDER — PHENYLEPHRINE 40 MG/250 ML (160 MCG/ML) IN 0.9 % SODIUM CHLORIDE IV
40 mg/250 mL (160 mcg/mL) | INTRAVENOUS | Status: DC
Start: 2020-10-21 — End: 2020-10-24
  Administered 2020-10-21 – 2020-10-23 (×2): 40 mL/h via INTRAVENOUS

## 2020-10-21 MED ORDER — VANCOMYCIN THERAPY PLACEHOLDER
INTRAVENOUS | Status: DC
Start: 2020-10-21 — End: 2020-10-25

## 2020-10-21 MED ORDER — OXYCODONE IMMEDIATE RELEASE 5 MG TABLET
5 mg | GASTROSTOMY | Status: DC | PRN
Start: 2020-10-21 — End: 2020-10-24
  Administered 2020-10-23: 02:00:00 5 mg via GASTROSTOMY

## 2020-10-21 MED ORDER — PROPOFOL 1,000 MG/100 ML IV INFUSION
1000 mg/100 mL | INTRAVENOUS | Status: DC
Start: 2020-10-21 — End: 2020-10-21
  Administered 2020-10-21: 19:00:00 via INTRAVENOUS

## 2020-10-21 MED ORDER — VANCOMYCIN IVPB (2 G IN 500ML NS)
Freq: Once | INTRAVENOUS | Status: CP
Start: 2020-10-21 — End: ?
  Administered 2020-10-21: 16:00:00 500.000 mL/h via INTRAVENOUS

## 2020-10-21 MED ORDER — OXYCODONE IMMEDIATE RELEASE 5 MG TABLET
5 mg | GASTROSTOMY | Status: DC | PRN
Start: 2020-10-21 — End: 2020-10-24
  Administered 2020-10-23: 13:00:00 5 mg via GASTROSTOMY

## 2020-10-21 MED ORDER — DEXMEDETOMIDINE 400 MCG/100 ML (4 MCG/ML) IN 0.9 % SODIUM CHLORIDE IV
400 mcg/100 mL (4 mcg/mL) | INTRAVENOUS | Status: DC
Start: 2020-10-21 — End: 2020-10-24
  Administered 2020-10-21 – 2020-10-24 (×12): 400 mL/h via INTRAVENOUS

## 2020-10-21 MED ORDER — VANCOMYCIN MAR LEVEL
Freq: Once | INTRAVENOUS | Status: CP
Start: 2020-10-21 — End: ?

## 2020-10-21 MED ORDER — HEPARIN (PORCINE) 5,000 UNIT/ML INJECTION SOLUTION
5000 unit/mL | Freq: Three times a day (TID) | SUBCUTANEOUS | Status: DC
Start: 2020-10-21 — End: 2020-10-27
  Administered 2020-10-21 – 2020-10-27 (×17): via SUBCUTANEOUS

## 2020-10-21 MED ORDER — LACTATED RINGERS IV BOLUS (NEW BAG)
Freq: Once | INTRAVENOUS | Status: CP
Start: 2020-10-21 — End: ?
  Administered 2020-10-21: 15:00:00 500.000 mL/h via INTRAVENOUS

## 2020-10-21 MED ORDER — CHLORHEXIDINE GLUCONATE 0.12 % MOUTHWASH
0.12 % | Freq: Two times a day (BID) | OROMUCOSAL | Status: DC
Start: 2020-10-21 — End: 2020-11-18
  Administered 2020-10-21 – 2020-11-18 (×57): 0.12 mL via OROMUCOSAL

## 2020-10-21 MED ORDER — DEXTROSE 5 % IN WATER (D5W) INTRAVENOUS SOLUTION
INTRAVENOUS | Status: DC
Start: 2020-10-21 — End: 2020-10-21
  Administered 2020-10-21: 19:00:00 1000.000 mL/h via INTRAVENOUS

## 2020-10-21 NOTE — Progress Notes
[facility]	SICU Progress NoteAttending Provider: Alric Quan, MDPost-Operative Day/Post Injury Day Procedure(s):2/5: [name] exchange (ENT) Hospital LOS: 12 days Interim History:  HPI: 55 y.o. male admitted to the SICU on 2/9 for ventilator management following an RRT on the oncology floor for desaturation and increased work of breathing while on trach mask and ultimately placed back on vent. ?Patient was initially a transfer from [facility] on 2/4 after trach and PEG when he had a partial obstruction of trach site from a new necrotic mass. He underwent trach exchange 2/5. Was on trach mask on oncology floor however had 2 episodes on floor requiring RRT for lavage and likely mucous plugging. ?Course Complicated by: - Incidental R IJ thrombus - Multiple episodes of presumed mucous plugging requiring RRT's ?PMH: Chronic ITP, Waldenstrom macroglobulinemia, HTN. HLD, DM, tobacco and alcohol use, Laryngeal SCC (weekly Cisplatin and RT)?PSH: None known ?Home meds: Lipitor, Pepcid, Lisinopril, Metformin, Oxycodone, miralax, Insulin U100 and Lantus ?SICU course:2/9: Arrives to SICU HDS, on vent PCV. Appears well. Transitioned to Pressure support on vent and tolerating well. O/N: Episode of patient feeling like not getting a full breath, extremely tachypenic and increased work of breathing and tachycardia, attempted suctioning and lavage without improvement, attempted to switch to PCV and VAC but due to dyssynchrony unable to tolerate, no desat but poor ventilation and TV and ETCO2 up to 100. ENT at bedside do not feel additional scope would benefit as he was just scoped a few hours ago, ultimately gave fentanyl push and started propofol for vent synchrony but despite max prop still dyssynchronous. Gave one dose of roccuronium once sedated for vent synchrony with good effect. Albuterol and HTS neb given and peaks improved from 50 to 30's. No further paralytic needed and now synchronous on VAC. Kept on propofol for vent synchrony. CXR reassuring and back to GCS 11T. Hypotension only following sedation and Rocc, Low dose Neo started. 2/10: Kept on higher sedation in AM for [place] A/P (for staging) and soft tissue neck. Weaning sedation and will plan for PSV trial once more awake. Scoped by ENT in AM, airway patent. Q1-2hr suction requirements of thick tan secretions. No further episodes of respiratory distress although sedation remains high. Tube feeds changed to continuous given significant hyperglycemia. Insulin regimen changed per diabetes team recommendations. Pressure ulceration noted around tracheostomy --> ENT at bedside to evaluation, likely some component of invasive tumor growth. Palliative care consult for assist with symptom management and family moving forward. Primary oncologist office made aware of referral. O/N: weaning down on Prop, no episodes of respiratory distress or desats overnight. Febrile to 101.3, sputum and blood cultures ordered. 500cc LR bolus given for some oliguria and rising Cr. 2/11: Given 500cc NS for oliguria with good response. Vanco level yesterday 25, likely cause of worsening aki, on hold. UOP & lytes remains stable. Insulin adjusted per diabetes team. PSV as tolerated (doing well). CPT added. Confirmed with ENT pulm recs for blue portex suctionaid trach- no plans to switch at this time. Bowel regimen added. Plan keep on neo if low dose but if inc, consider changing to levo as likely then not in setting of sedation. Family updated on phone. Confirmed w heme- no AC rec for R IJ thrombus until plt > 50. Was going to attempt to place pt on TM overnight as he did very well on PSV x 3hrs, however, pt had anxiety attack regarding his cancer and prognosis, he requested to sleep during night and was agreeable to TM in  am. O/N: Patient agrees to PSV overnight which he did very well on. Agreeable to getting OOB to chair and possibly trach mask in AM but wants to stay on vent tonight. Off Neo by AM.  2/12: trial trach mask as tolerated this AM. Adjusted insulin regimen per the diabetes management service. Sputum now with staph, awaiting sensitivities. More loose stools today; nursing placed rectal tube and held bowel regimen.  ON: Anxious at times, feeling like he cannot breath but doing well on TM. Frequent suctioning. Increased standing. FIO2 increased to 40%.  insulin to 14U per endo reccs. 2/13: continue to adjust insulin regimen, now 20u regular q6. Cr continues to uptrend 2.57 --> 2.63 and repeat vanco level still elevated 24.6; pharmacy to continue dosing adjustments. Otherwise, remains on trach mask. Sputum with MRSA --> d/c ceftriaxone. ON: increased standing insulin to 24U. 2/14: Will discuss [facility] plan with hematology now that PLT > 50 x2 checks. Will also d/c vancomycin today (10d of therapy thus far). AKI appears to have peaked at 2.57 -> 2.63 -> 2.47. Awaiting transfer to the oncology service on NP 15. Okay to start [facility], but will hold off today given more bloody secretions. 2/15: Plan to start palliative RT today. AM session aborted due to extreme anxiety (despite premedication with IV ativan), tried again in the PM with 10mg  IV Valium + 3mg  IV ativan, procedure again aborted due to extreme anxiety and inability to tolerate. Pending discussion with rad onc in AM re: utility of ongoing attempts at RT. Pending transfer to NP15 on med onc service.  O/N: increasingly anxious / restless, but able to re-direct and calm down with PRN ativan. Liquid stools, placed rectal tube. Will start banatrol and check CBC in AM, check c dif. This had previously improved by holding bowel regimen.2/16: Acutely increased WOB, HR 130s, SBP 170s --> ENT bedside scope with trach patent, though notable secretions --> suctioned with minimal improvement; placed back on ventilator PSV 10 / +5 with immediate improvement in VS and respiratory distress. Precedex gtt started to assist more frequent suctioning. CXR with LLL consolidation --> resend sputum, and restart Vanco (close trough monitoring with AKI) given known MRSA PNA. Increased diarrhea, cdiff negative, transition to Vital TF. LR 500cc for hemoconcentration of labs, tachycardia. Appreciate radiation oncology; sedated patient with Propofol gtt, bolus x1 with good effect --> able to tolerate 15 minute radiation session; plan to repeat Thursday, Friday, Monday, and Tuesday. Review of Allergies/Meds/Hx: I have reviewed the patient's: allergies, past medical history, past surgical history, family history, social history, prior to admission medicationsObjective: Vitals:I have reviewed the patient's current vital signs as documented in the EMRTemp:  [97.2 ?F (36.2 ?C)-98.1 ?F (36.7 ?C)] 98.1 ?F (36.7 ?C)Pulse:  [100-120] 101Resp:  [8-42] 22BP: (119-195)/(62-97) 129/62SpO2:  [94 %-100 %] 100 %SpO2: 100 %Device (Oxygen Therapy): mechanical ventilatorI/O's:I have reviewed the patient's current I&O's as documented in the EMR.Gross Totals (Last 24 hours) at 10/21/2020 1207Last data filed at 10/21/2020 1100Intake 2586.47 ml Output 2200 ml Net 386.47 ml Physical ExamConstitutional:     General: Anxious tearful gentleman appearing older than stated age. HENT:    Head: Normocephalic and atraumatic.    Mouth: Mucous membranes are moist.    Extraocular Movements: Extraocular movements intact.    Pupils: Pupils are equal, round, and reactive to light.    Comments: Tracheostomy in place, with surrounding sanguinous ooze intermittently, stoma SQ tissue exposed.Cardiovascular:    Rate and Rhythm: Regular rhythm. Tachycardia present.    Heart sounds: No  murmur heard. Pulmonary:    Breath sounds: No wheezing, rhonchi or rales.    Comments: Mildly increased WOB and tachypnea, ventilated.Abdominal:    General: There is no distension.    Tenderness: There is no abdominal tenderness.    Comments: PEG tube in place  Genitourinary:   Comments: Normal genitalia, voiding spontaneouslyMusculoskeletal:    Right lower leg: No edema.    Left lower leg: No edema.    Comments: Moves all extremities equally, to command.Skin:   General: Skin is warm and dry.    Capillary Refill: Capillary refill takes less than 2 seconds. Neurological:    General: No focal deficit present.    Mental Status: He is alert.    Cranial Nerves: No cranial nerve deficit.    Motor: No weakness. Labs: Refer to EMR. I have reviewed the patient's labs within the last 24 hours; see assessment and plan below for significant abnormals.Microbiology:Refer to EMR. I have reviewed all new results within the last 24 hours; see assessment and plan below.Diagnostics:Refer to EMR. I have reviewed all new results within the last 24 hours; see assessment and plan below.Chest ZO:XWRUE to EMR. I have reviewed all new results within the last 24 hours; see assessment and plan below.Abdominal AV:WUJWJ to EMR. I have reviewed all new results within the last 24 hours; see assessment and plan below.ECG/Tele Events: I have reviewed the patient's ECG and telemetry as resulted in the EMR.Assessment/Plan Neurologic: Analgesia- Tylenol 650 Q6 PRN - Gabapentin 100mg  Q8- Oxycodone 5 / 10mg  Q4 PRN - Dilaudid 0.5mg  Q4 PRN breakthroughAnxiety- Start Precedex gtt- Ativan PO 1mg  Q6 PRN- Trazodone 25mg  QHS PRN- Appreciate palliative care consult for symptom management, ?SSRI?ENT: L laryngeal SCC, invading tracheostomy: s/p trach exchange 10/10/20 and biopsy 10/12/20- Bivona trach to bypass obstruction - Unfortunately, there are no surgical / curative options- Unable to tolerate palliative radiation (2/15) well tolerated today on ventilator with full propofol sedation (2/16)?Cardiac: Sinus tachycardia- Multifactorial in the setting of extreme anxiety, WOB and need for standing albuterol nebs; possible hypovolemia in the setting of large stool and UOP, possible PE given DVT but unable to [facility]- 500cc LR bolus nowHx of HTN / HLD- Holding home lisinopril given AKI- Holding home lasix given AKI- Continue home crestor ?Respiratory:  Respiratory failure- Tumor invasion of tracheal stoma; though trach is patent, tumor appears circumferential; also complicated by high secretion burden and frequent mucous plugging- Resume antibiotics as below- Palliative radiation as noted- Albuterol Q6- Interventional pulm following?GI: Diarrhea- Change TF to Vital @ 95- Banatrol added- Rectal tube in place- Cdiff negative?Renal: AKI on CKD (baseline Cr 1.5-1.6), 2/2 supratherapeutic vanco level- Cr peaked 2.63; slowly downtrending- Voiding spontaneouslyHypovolemic hypernatremia- 500cc LR bolus now- Consider diluted TF vs D5W if Na > 149?Infectious Disease: MRSA PNA- Vanco (2/5 - 2/14), resume (2/16 - ?)- Resend sputum culture?Hematology:Acute blood loss anemia- Sanguinous secretions, expected given tumor burden and coagulopathy, may worsen with radiation- Maintain hgb >7?Hx of chronic ITP, Waldenstrom macroglobulinemia- Transfuse platelets <50 if bleeding, <10 if no active bleeding- Appreciate hematology recommendations?R IJ thrombus - Noted on [place] (2/5, 2/10)- High risk heparin gtt when plt consistently >50, and sanguinous secretions improve- HSQ- Maintain SCDs at all times?Endocrine: Hx T2DM- Lantus 20u daily- Regular insulin 24u Q6- Regular MDISS- HOLD home Metformin, Lantus 64u, Apidra 20u TID w meals- Appreciate diabetes team recsMusculoskeletal: Generalized deconditioning- OOB daily, PT/OT evals- Turn and reposition Q2 hours, PUP dressing?Device Review:- R IJ Port (08/12/20) per guidelines, nonocclusive DVT does not require removal of line- PEG (  09/14/20)- Bivona trach (10/10/20)?Code Status / Dispo:- Full code - Accepted to NP15 by the oncology service- Appreciate palliative care assistance with GOC discussionsNotifications : For questions please call SICU Covering Provider listed in [facility] / EpicSigned:Judithann Villamar, PA-C2/16/2022SICU [facility] [employer]oles (secondary)[facility] 7-1 APP			[phone]

## 2020-10-21 NOTE — Plan of Care
Plan of Care Overview/ Patient Status    RN transported with patient to radiation department in AM, 2mg  IV Ativan admin prior to travel to radiation department. Pt unable to tolerate laying flat with radiation mask over face due to anxiety. SICU team called and notified.Reattampted travel to radiation department in afternoon. 10mg  IV Valium admin 1 hour prior to procedure. 2mg  IV Ativan and 1mg  IV Ativan admin in radiation department, Dr. Webb Silversmith present at bedside for both radiation attempts in AM and PM. Pt unable to tolerate laying flat with mask over face, SICU team notified. Pt alert, anxious at times. PRN Ativan admin via PEG. PRN oxycodone admin via PEG for pain with good affect. PERRL. Afebrile. HR 100s-110s sinus rhythm. SBPs 110s-160s (SBPs 170s-180s with agitation). Moderate amount thick, blood-tinged secretions suctioned via trach. Pt on 40% trach mask. Trach care done. NPO. Pt tolerating Diabetasource tube feed at goal 32mL/hr. Minimal residuals. Pt continent 1 large liquid BM and incontinent 1 large liquid BM. Voiding in urinal. See documentation for vitals and full assessment.  Leroy Libman, RN

## 2020-10-21 NOTE — Plan of Care
Plan of Care Overview/ Patient Status    Pt A&Ox4. Using voice app for communication. PERRL. MAE. Anxious and tearful at times. All interventions, POC, and pt's questions answered. Emotional support provided. Ativan 1mg  prn with good effect. Pt c/o of pain to neck when coughing and with movement. Relieved with prn Dilaudid 0.5mg  IV. Pt refused prn oxycodone. Pt did not sleep more than one hour overnight, appears to be becoming delerious, team aware. Afebrile. HR 100-120, NSR-sinus tachycardia. BP 131-180's/71-90's. Hypertensive when anxious and moving. B LS diminished and coarse. Pt with productive cough but requires suctioning about every 3-4 hours. Suctioned for small-moderate amount of thick, tan secretions. Abd soft and non-tender. Incontinent of loose BM x3. Rectal tube placed. TF continue at goal rate via PEG tube. Self voiding clear/yellow urine in urinal, see I&O's for details. T&P q2hours. All interventions and POC explained to pt. See flowsheets for further information.

## 2020-10-21 NOTE — Consults
VANCOMYCIN CONSULT Loading Dose: N/ACurrent Vancomycin Order: 2 g IV per laboratory/placeholder data (Patient received vancomcyin 2/5 to 2/14, vancomycin level were therapeutic on 2/14) Vancomycin Indication: Suspected MRSA infectionRenal Function: AKIScr: Creatinine (mg/dL) Date Value 16/06/9603 2.26 (H) 10/20/2020 2.29 (H) 10/19/2020 2.47 (H)  CrCl: Estimated Creatinine Clearance: 43 mL/min (A) (by C-G formula based on SCr of 2.26 mg/dL (H)).Day of Therapy: 1 (patient received vancomycin 2/5 --> 2/14)Planned Duration of Therapy: to be determinedTrough Goal: 10-15Vancomycin level: indicated based on the following: Rapid change in renal functionVancomycin Level Scheduled:  Random on 2/17 at 0900 (24 hours post-dose)ID/AST Consulted? NoYNHH/LMH/WH Vancomycin Dosing GuidelineBH Vancomycin Dosing GuidelineFor questions, please contact the pharmacist: Smiley Houseman, PharmD         Phone/Mobile Heartbeat: M[facility]

## 2020-10-21 NOTE — Progress Notes
Inpatient Diabetes Management Team Follow-up Note    Over the past 24 hours, the patient's glucose control has been variable but close to goal     ? Current Nutritional Status:  Diabetisource @ 95 ml/hr --> changed to Vital AF 1.2@ 130cc/hr   ? Current Anti-hyperglycemic Regimen:  glargine 20 unit q noon, regular insulin 24 units q 6 hours, regular insulin MID dose sliding scale q 6 hours  ?  Home Anti-hyperglycemic Regimen: Toujeo U300 64 units daily, Apidra 20 units TID with meals, metformin 1000 mg bid   ? Other Contributing Medications: D51/2NS-- dc'd at 1am     PE:    Temp:  [97.2 ?F (36.2 ?C)-98.1 ?F (36.7 ?C)] 98.1 ?F (36.7 ?C)  Pulse:  [104-121] 112  Resp:  [22-42] 23  BP: (114-195)/(71-97) 170/81  SpO2:  [94 %-100 %] 99 %  Device (Oxygen Therapy): mechanical ventilator  O2 Flow (L/min):  [8-10] 8     Wt Readings from Last 3 Encounters:   10/21/20 132.6 kg   10/06/20 (!) 139.5 kg   09/29/20 (!) 142.4 kg       Data Review:  BGs:      Creatinine (mg/dL)   Date Value   16/06/9603 2.26 (H)     Hemoglobin A1c (%)   Date Value   10/10/2020 9.0 (H)       Assessment:  55 yo h/o type 2 diabetes, HTN, Waldenstroms macroglobulinemia, ETOH, laryngeal SCC s/p trach/PEG presented with SOB significant for tumor invading the trach stoma with partial obstruction.  Diabetes team consulted for assistance in glycemic management.      BG over the past 24 hours have been variable.  He had radiation therapy and unclear if his tube feeds had been stopped for a brief time resulting in drop of his BG to 80's at 6pm.  His tube feeds will be changed today from Diabetisource to Vital AF which will result in an increase in dextrose from 228g to 345g in 24 hours which is a 50% increase which would likely result in an increase in his overall insulin requirements.  As the tube feeds are increasing would not reduced the standing regular insulin at this time.  Discussed with covering provider, Jennye Boroughs, PA.  Given patient on continuous tube feedings, target blood glucose between 140mg /dl-180mg /dL.  If bolus regular insulin is administered and TFs are held patient is at risk of developing hypoglycemia. Please check BG q1 hour x6 hours and start IV fluids with D5 if BG < 100 mg/dL        Recommendations:   1. Continue glargine 20 units q noon   2. Continue regular insulin 24 units q 6 hours -- HOLD if tube feeds stopped or discontinued   3. Continue regular insulin mid dose sliding scale q 6 hours   4. Continue blood glucose monitoring q 6 hours   5. Diet: tube feeds per nutrition       Please let us know if you have any questions or concerns about our recommenations. We will continue to follow the patient along with you during his hospitalzation as we attempt to optimize his glycemic control.  Detailed recommendations were communicated to the primary team both verbally and/or through this written note.  I spent a total of 20 minutes with the patient of which 15 minutes (over 50%) was spent in counseling the patient regarding diabetes management and coordinating care with the primary team.     Signed:  Yevonne Aline PA  Inpatient Diabetes Management Team   [facility]: [phone]  Diabetes consult pager # [phone]      NOTE: During nights, weekends, and holidays, please contact the on-call Endocrine Fellow at pager [phone].

## 2020-10-21 NOTE — Plan of Care
Plan of Care Overview/ Patient Status    NEURO: Pt is AAOx4, very anxious this AM, not redirectable, frequently tearful, emotional support provided. Initially refusing all pain/ativan this AM, despite pt being in pain and anxious. MAE to command, frequently sitting up in bed and leaning other side of bed to help pt breath easier. Unable to get to chair this shift, pt too tachycardic and tachypnic. Precedex initiated this shift with good effect, switched to propofol for radiation and then placed back on precedex. ID: WBC 8.8. IV vanc restarted today, renally dosed. RESP: Very tachypeic this shift, RR 25-high 30s, all morning, using accessory muscles, belly breathing. Pt has #8 cuffed bivona Initially on Trach mask but appeared in respiratory distress with no desat, switched to CPAP 10/5, tolerated very well, pt appears way more comfortable, RR decr to 15-20s. Satting 95-100%. Frequent suctioning q1-2 hours for tan/pink tinged secretions. Sputum sample sent. CV: HR 100-130s when anxious. Remains in Sinus tach. SBP 120-170s, hypertensive when anxious, much improvement after dex was started. Palpable pulses present. GI/GU: Abd is round and obese. + Bowel sounds. Peg tube in place, TF changed to vital AF at 57ml/hr. To be held x2 hours prior to Radiation this afternoon, per SICU team, started d5w in between, TF restarted when returned to floor.. Pt found to be to be eating glucose tabs he found in his backpack, tab retrieved from mouth and team made aware, education provided. Bladder scanned at 5p for 856, straight cath x1, PVR 0. Bld sgr checks q6hr. Voids spontaneously in the urinal. Rectal tube in place, leaking this AM, thin liquid stool noted, bannatrol continued, c diff pending.  LINES: x1 #20g PIV to left. x1 #20g extended dwell to right arm and a right chest port. SKIN: Incontinent derm noted to scrotum and groin area as well as excoriation to coccyx, zinc applied. T&P q 2hrs. PLAN: To go for radiation this afternoon, on the ventilator and using the support of propofol to lay flat for procedure.

## 2020-10-21 NOTE — Progress Notes
Surgical Intensive Care Unit  AttendingI have personally performed a face to face diagnostic evaluation on this patient, reviewed the chart, available data and care plan and have personally formulated the plan outlined below. I have seen this patient in addition to the advanced practitioner given patient's complexity and associated need for critical care. 55 y.o. male admitted on 10/09/2020 for respiratory failure secondary to airway obstruction due to advanced laryngeal Ca. He underwent trach change on 2/5. Subsequently had 2 episodes of RRT and mucus plugging. Transitioned to [facility] trach. Extubated 2/12. On Trach mask. Secretions improving. Hospital Day 12 Accepted by medical Onc. Awaiting transfer. Did not tolerate Radiation treatment despite significant sedation. Increased WOB overnight.  Past Medical History: Diagnosis Date ? Diabetes mellitus (HC Code) (HC CODE)  ? Hypercholesteremia  ? Hypertension  No past surgical history on file.Scheduled Meds:Current Facility-Administered Medications Medication Dose Route Frequency Provider Last Rate Last Admin ? albuterol neb sol 2.5 mg/3 mL (0.083%) (PROVENTIL,VENTOLIN)  2.5 mg Nebulization Q6H WA [place], [place], [place]   2.5 mg at 10/21/20 1324 ? Banatrol Plus oral powder packet  1 packet Oral TID Lisette Abu, PA     ? chlorhexidine gluconate (PERIDEX) 0.12 % solution 15 mL  15 mL Mouth/Throat Q12H Leonia Reeves, PA   15 mL at 10/21/20 1050 ? gabapentin (NEURONTIN) solution 100 mg  100 mg Per NG tube Q8H [place], [place], [place]   100 mg at 10/21/20 4010 ? heparin (porcine) injection 5,000 Units  5,000 Units Subcutaneous Q8H Lisette Abu, PA     ? insulin glargine (Semglee,Lantus) injection 20 Units  20 Units Subcutaneous Daily Lisette Abu, [place]   20 Units at 10/21/20 1138 ? insulin regular human (HumuLIN R, NovoLIN R) 100 unit/mL injection 24 Units  24 Units Subcutaneous Q6H Lisette Abu, [place]   24 Units at 10/21/20 1138 ? insulin regular human (HumuLIN R, NovoLIN R) Sliding Scale (See admin instructions for dose)   Subcutaneous Q6H Lisette Abu, PA   2 Units at 10/21/20 1138 ? rocuronium (ZEMURON) 10 mg/mL injection          ? rosuvastatin (CRESTOR) tablet 10 mg  10 mg Per G Tube Daily Jeanmarie Plant, MD   10 mg at 10/21/20 2725 ? traZODone (DESYREL) tablet 25 mg  25 mg Per G Tube Nightly Jennye Boroughs, [place]   25 mg at 10/20/20 2002 ? vancomycin (VANCOCIN) 2 g in sodium chloride 0.9% 500 mL IVPB  2 g Intravenous Once Leonia Reeves, PA 250 mL/hr at 10/21/20 1100 Rate Verify at 10/21/20 1100 ? [START ON 10/22/2020] Vancomycin MAR Level   Intravenous Once Irine Seal, MD     ? Vancomycin Therapy Placeholder   Intravenous placeholder Irine Seal, MD     Continuous Infusions:? dexmedetomidine 0.2 mcg/kg/hr (10/21/20 1100) ? dextrose 5 %   ? propofol (DIPRIVAN) 1,000 mg in 100 mL (10 mg/mL)   PRN Meds: acetaminophen, dextrose 50% in water (D50W), dextrose 50% in water (D50W), HYDROmorphone, LORazepam, oxyCODONE **OR** oxyCODONE, zinc oxideVITALS:Temp (24hrs), Avg:36.4 ?C, Min:36.2 ?C, Max:36.7 ?CVitals:  10/21/20 0910 10/21/20 1000 10/21/20 1100 10/21/20 1106 BP:  131/66 129/62  Pulse: (!) 112 (!) 110 (!) 100 (!) 101 Resp: (!) 23 (!) 8 (!) 23 (!) 22 Temp:     TempSrc:     SpO2: 99% 100% 100% 100% Weight:     Height:     Intake/Output Summary (Last 24 hours) at 10/21/2020 1220Last data filed at 10/21/2020 1100Gross per 24 hour Intake 2586.47  ml Output 2200 ml Net 386.47 ml Physical Exam Anxious looking. On Trach mask.Blood tinged secretions notedIncreased WOB Course BSLabs Last 24 hours:Most Recent Result Component Value Date/Time  Sodium 147 (H) 10/21/2020 05:21 AM  Potassium 4.3 10/21/2020 05:21 AM  Chloride 104 10/21/2020 05:21 AM  CO2 33 (H) 10/21/2020 05:21 AM  BUN 60 (H) 10/21/2020 05:21 AM Creatinine 2.26 (H) 10/21/2020 05:21 AM  WBC 8.8 10/21/2020 05:21 AM  Hemoglobin 8.3 (L) 10/21/2020 05:21 AM  Hematocrit 28.20 (L) 10/21/2020 05:21 AM  Platelets 42 (L) 10/21/2020 05:21 AM Imaging Last 24 hours:XR Chest PA or APResult Date: 2/16/2022Left basilar consolidation and small pleural effusion.  Reported And Signed By: Maudie Flakes, MD  [facility] Radiology and Biomedical ImagingAssessment / Plan:Neurologic:  Pain is being controlled with the current regimen: Oxy+Dilaudid+ Tylenol.Anxiety being managed with Ativan. Increase dose. Consider psych consult for the management of severe anxiety.renal dose gabapentinAdd Precedex as he is now on the vent. Cardiac: Heart rate: Tachycardia. Improved after placing on vent. blood pressure: >65MAP goal: >65Respiratory: Pulmonary insufficiency: On trach mask. Able to cough secretions. Case discussed in tumor board. Not surgical candidate.  radiotherapy and medical/palliative management.Increased WOB: placed on vent with improvement. Sedated with precedex. Plan for profol when he goes for radiation therapy. FOB by ENT. Secretions noted in the trach. Tumor not obstructing the trach or distally. Wean O2 as tolerated.Continue nebs for secretion mobilization Infectious Disease/Sepsis:S/P MRSA pneumonia/ necrotic mass. Completed 7 days of antibx therapy. In vie of increased secretions will restart antibiotics. Renal, Fluids/Electrolytes: AKI, Acute renal failure - Cr 2.4. Thought to be related to vanco toxicity. Follow Cr trendAppropriate urine output.GI/Nutrition: Protein-calorie malnutrition - Albumin low.NPO. Has diarrhea: Continue J-tube feeds at goal. Change to vital AF. Banatrol.Hematology:Acute blood loss anemia.Transfuse for Hct <7Walstrom globunemia: no intervention.Plts -low. No need for transfusion. VTE prophylaxis with heparin SQ. He has RIJ thrombus. Not a candidate for full anticoagulation as he is likley to bleed from the tumor (already has blood tinged secretions)Endocrine:  Insulin protocol for hyperglycemiaMusculoskeletal:  PT/OT as able.Skin/wound: Frequent repositioning to mitigate against pressure ulcerationGoals of Care/Advance Directives:Code status: Full codePalliative care is consulted and involved in care.    Prophylaxis:  As abovePlan discussed with:  Primary Service, Critical Care Nursing, Respiratory Therapy, PharmacySubsequent Care Time:35 minRespiratory failure: Restarted on mechanical ventilation. [name] MDSICU Attending

## 2020-10-22 ENCOUNTER — Ambulatory Visit: Admit: 2020-10-22 | Payer: PRIVATE HEALTH INSURANCE

## 2020-10-22 DIAGNOSIS — J9601 Acute respiratory failure with hypoxia: Secondary | ICD-10-CM

## 2020-10-22 LAB — BASIC METABOLIC PANEL
BKR ANION GAP: 12 (ref 7–17)
BKR BLOOD UREA NITROGEN: 59 mg/dL — ABNORMAL HIGH (ref 6–20)
BKR BUN / CREAT RATIO: 25.7 — ABNORMAL HIGH (ref 8.0–23.0)
BKR CALCIUM: 9.1 mg/dL (ref 8.8–10.2)
BKR CHLORIDE: 108 mmol/L — ABNORMAL HIGH (ref 98–107)
BKR CO2: 31 mmol/L — ABNORMAL HIGH (ref 20–30)
BKR CREATININE: 2.3 mg/dL — ABNORMAL HIGH (ref 0.40–1.30)
BKR EGFR (AFR AMER): 36 mL/min/{1.73_m2} (ref >60)
BKR EGFR (NON AFRICAN AMERICAN): 30 mL/min/{1.73_m2} (ref >60)
BKR GLUCOSE: 183 mg/dL — ABNORMAL HIGH (ref 70–100)
BKR POTASSIUM: 4.5 mmol/L (ref 3.3–5.3)
BKR SODIUM: 151 mmol/L — ABNORMAL HIGH (ref 136–144)

## 2020-10-22 LAB — PROCALCITONIN     (BH GH LMW Q YH): BKR PROCALCITONIN: 0.31 ng/mL — ABNORMAL HIGH

## 2020-10-22 LAB — MAGNESIUM: BKR MAGNESIUM: 2.3 mg/dL (ref 1.7–2.4)

## 2020-10-22 LAB — PHOSPHORUS     (BH GH L LMW YH): BKR PHOSPHORUS: 3.9 mg/dL (ref 2.2–4.5)

## 2020-10-22 LAB — VANCOMYCIN, RANDOM     (BH GH LMW YH): BKR VANCOMYCIN RANDOM: 17.9 ug/mL — ABNORMAL HIGH

## 2020-10-22 MED ORDER — INSULIN U-100 REGULAR HUMAN 100 UNIT/ML INJECTION SOLUTION
100 unit/mL | Freq: Four times a day (QID) | SUBCUTANEOUS | Status: DC
Start: 2020-10-22 — End: 2020-10-26
  Administered 2020-10-22 – 2020-10-26 (×15): 100 mL via SUBCUTANEOUS

## 2020-10-22 MED ORDER — VANCOMYCIN MAR LEVEL
Freq: Once | INTRAVENOUS | Status: CP
Start: 2020-10-22 — End: ?

## 2020-10-22 MED ORDER — DOXEPIN 10 MG/ML ORAL CONCENTRATE
10 mg/mL | Freq: Every evening | JEJUNOSTOMY | Status: DC
Start: 2020-10-22 — End: 2020-11-04
  Administered 2020-10-23 – 2020-11-04 (×13): 10 mL via JEJUNOSTOMY

## 2020-10-22 MED ORDER — PROPOFOL 1,000 MG/100 ML IV INFUSION
1000 mg/100 mL | INTRAVENOUS | Status: DC
Start: 2020-10-22 — End: 2020-10-24
  Administered 2020-10-22 – 2020-10-23 (×2): via INTRAVENOUS

## 2020-10-22 MED ORDER — DEXTROSE 5 % IN WATER (D5W) INTRAVENOUS SOLUTION
INTRAVENOUS | Status: DC
Start: 2020-10-22 — End: 2020-10-24
  Administered 2020-10-22 – 2020-10-24 (×3): 1000.000 mL/h via INTRAVENOUS

## 2020-10-22 MED ORDER — CLONAZEPAM 0.5 MG TABLET
0.5 mg | Freq: Two times a day (BID) | JEJUNOSTOMY | Status: DC | PRN
Start: 2020-10-22 — End: 2020-10-25
  Administered 2020-10-23 – 2020-10-24 (×3): 0.5 mg via JEJUNOSTOMY

## 2020-10-22 MED ORDER — CLONAZEPAM 0.5 MG TABLET
0.5 mg | Freq: Two times a day (BID) | JEJUNOSTOMY | Status: DC
Start: 2020-10-22 — End: 2020-11-23
  Administered 2020-10-23 – 2020-11-22 (×62): 0.5 mg via JEJUNOSTOMY

## 2020-10-22 MED ORDER — CEFTAZIDIME IV PUSH 2000 MG VIAL & NS (ADULT)
Freq: Two times a day (BID) | INTRAVENOUS | Status: DC
Start: 2020-10-22 — End: 2020-10-24
  Administered 2020-10-22 – 2020-10-24 (×5): 20.000 mL via INTRAVENOUS

## 2020-10-22 MED ORDER — VANCOMYCIN IVPB (2 G IN 500ML NS)
Freq: Once | INTRAVENOUS | Status: CP
Start: 2020-10-22 — End: ?
  Administered 2020-10-23: 04:00:00 500.000 mL/h via INTRAVENOUS

## 2020-10-22 NOTE — Progress Notes
Surgical Intensive Care Unit  AttendingI have personally performed a face to face diagnostic evaluation on this patient, reviewed the chart, available data and care plan and have personally formulated the plan outlined below. I have seen this patient in addition to the advanced practitioner given patient's complexity and associated need for critical care. 55 y.o. male admitted on 10/09/2020 for respiratory failure secondary to airway obstruction due to advanced laryngeal Ca. He underwent trach change on 2/5. Subsequently had 2 episodes of RRT and mucus plugging. Transitioned to [facility] trach. Extubated 2/12. On Trach mask. Secretions improving. Hospital Day 13 Accepted by medical Onc. Awaiting transfer. Did not tolerate Radiation treatment despite significant sedation. Increased WOB overnight.  Past Medical History: Diagnosis Date ? Diabetes mellitus (HC Code) (HC CODE)  ? Hypercholesteremia  ? Hypertension  No past surgical history on file.Scheduled Meds:Current Facility-Administered Medications Medication Dose Route Frequency Provider Last Rate Last Admin ? albuterol neb sol 2.5 mg/3 mL (0.083%) (PROVENTIL,VENTOLIN)  2.5 mg Nebulization Q6H WA [place], [place], [place]   2.5 mg at 10/22/20 1610 ? Banatrol Plus oral powder packet  1 packet Oral TID Lisette Abu, PA   1 packet at 10/22/20 1615 ? cefTAZIDime (FORTAZ) 2 g in sodium chloride 0.9 % 20 mL (100 mg/mL)  2 g Intravenous Q12H [place], [place], PA   2 g at 10/22/20 1141 ? chlorhexidine gluconate (PERIDEX) 0.12 % solution 15 mL  15 mL Mouth/Throat Q12H Leonia Reeves, PA   15 mL at 10/22/20 9604 ? clonazePAM (KlonoPIN) tablet 0.5 mg  0.5 mg Per J Tube BID Mat Carne, APRN     ? doxepin (SINEquan) solution 10 mg  10 mg Per J Tube Nightly [place], Kandis Nab, APRN     ? gabapentin (NEURONTIN) solution 100 mg  100 mg Per NG tube Q8H [place], [place], [place]   100 mg at 10/22/20 1615 ? heparin (porcine) injection 5,000 Units  5,000 Units Subcutaneous Q8H Lisette Abu, PA   5,000 Units at 10/22/20 1615 ? insulin glargine (Semglee,Lantus) injection 20 Units  20 Units Subcutaneous Daily Lisette Abu, [place]   20 Units at 10/22/20 1138 ? insulin regular human (HumuLIN R, NovoLIN R) 100 unit/mL injection 26 Units  26 Units Subcutaneous Q6H Mat Carne, APRN   26 Units at 10/22/20 1734 ? insulin regular human (HumuLIN R, NovoLIN R) Sliding Scale (See admin instructions for dose)   Subcutaneous Q6H Lisette Abu, PA   2 Units at 10/22/20 1733 ? rocuronium (ZEMURON) 10 mg/mL injection          ? Vancomycin MAR Level   Intravenous Once Irine Seal, MD     ? Vancomycin Therapy Placeholder   Intravenous placeholder Irine Seal, MD     Continuous Infusions:? dexmedetomidine 0.3 mcg/kg/hr (10/22/20 1800) ? dextrose 5 % 50 mL/hr (10/22/20 1800) ? [Held by provider] phenylephrine Stopped (10/21/20 1741) ? [Held by provider] propofol (DIPRIVAN) 1,000 mg in 100 mL (10 mg/mL) Stopped (10/22/20 1759) PRN Meds: acetaminophen, clonazePAM, dextrose 50% in water (D50W), dextrose 50% in water (D50W), HYDROmorphone, oxyCODONE **OR** oxyCODONE, zinc oxideVITALS:Temp (24hrs), Avg:36.4 ?C, Min:36.1 ?C, Max:36.7 ?CVitals:  10/22/20 1550 10/22/20 1600 10/22/20 1700 10/22/20 1800 BP:  113/62 (!) 112/55 (!) 128/58 Pulse: 65 65 68 72 Resp: (!) 0 (!) 0 20 18 Temp:  36.7 ?C   TempSrc:  Temporal   SpO2: 100% 100% 100% 100% Weight:     Height:     Intake/Output Summary (Last 24 hours) at 10/22/2020 1824Last data filed  at 10/22/2020 1800Gross per 24 hour Intake 2887.67 ml Output 1550 ml Net 1337.67 ml Physical Exam Less Anxious looking. However he is tearful.On vent with CPAPCourse BSLabs Last 24 hours:Most Recent Result Component Value Date/Time  Sodium 151 (H) 10/22/2020 05:43 AM  Potassium 4.5 10/22/2020 05:43 AM  Chloride 108 (H) 10/22/2020 05:43 AM  CO2 31 (H) 10/22/2020 05:43 AM  BUN 59 (H) 10/22/2020 05:43 AM  Creatinine 2.30 (H) 10/22/2020 05:43 AM Imaging Last 24 hours:No results found.Assessment / Plan:Neurologic:  Pain is being controlled with the current regimen: Oxy+Dilaudid+ Tylenol.Anxiety being managed with Ativan.Psych has recommended doxepin and clonazepam. Will change to pych recs. Sedated with Precedex as he is now on the vent. Renal dose gabapentinCardiac: Heart rate: Tachycardia. Improved after placing on vent. blood pressure: >65MAP goal: >65Respiratory: Pulmonary failure:  Increased WOB: FOB by ENT. Secretions noted in the trach. Tumor not obstructing the trach or distally.placed on vent with improvement. Sedated with precedex. Radiotherapy and medical/palliative management.Plan for profol when he goes for radiation therapy. Wean O2 as tolerated.Continue nebs for secretion mobilization Infectious Disease/Sepsis:S/P MRSA pneumonia/ necrotic mass. Sputum positive for gram negative rods: add ceftazidime.Renal, Fluids/Electrolytes: AKI, Acute renal failure - Cr 2.3. Stable.Follow Cr trendAppropriate urine output.Hypernatremia: Add free water flushes and D5WGI/Nutrition: Protein-calorie malnutrition - Albumin low.NPO except sips for comfort. Has diarrhea: Continue J-tube feeds at goal. Change to vital AF. Banatrol.Hematology:Acute blood loss anemia.Transfuse for Hct <7Walstrom globunemia: no intervention.Plts -low. No need for transfusion. VTE prophylaxis with heparin SQ. He has RIJ thrombus. Not a candidate for full anticoagulation as he is likley to bleed from the tumor (already has blood tinged secretions)Endocrine:  Insulin protocol for hyperglycemiaMusculoskeletal:  PT/OT as able.Skin/wound: Frequent repositioning to mitigate against pressure ulcerationGoals of Care/Advance Directives:Code status: Full codePalliative care is consulted and involved in care. Ongoing goals of care discussions.   Prophylaxis:  As abovePlan discussed with:  Primary Service, Critical Care Nursing, Respiratory Therapy, PharmacyCritical Care Time: 10 minRespiratory failure: Restarted on mechanical ventilation. [name] MDSICU Attending

## 2020-10-22 NOTE — Other
VANCOMYCIN LEVEL EVALUATIONCurrent Vancomycin Order: 2 g IV per laboratory/placeholder data. Day of Therapy: 2Vancomycin Indication: Suspected MRSA infectionEstimated Creatinine Clearance: 42 mL/min (A) (by C-G formula based on SCr of 2.3 mg/dL (H)).Creatinine (mg/dL) Date Value 16/06/9603 2.30 (H) 10/21/2020 2.26 (H) 10/20/2020 2.29 (H)  Renal Function: AKILevel: Lab Results Last 72 Hours Component Value Date/Time  Vancomycin Random 17.9 (H) 10/22/2020 09:19 AM Type of Level: Random (~24 hours post-dose)Based on vancomycin level obtained, recommend:  Hold dose until level appropriateRepeat Level:  2/17 @ 2100 (~36 hours post dose)ID/AST Consulted? No YNHH/LMH/WH Vancomycin Dosing GuidelineBH Vancomycin Dosing GuidelineFor questions, please contact the pharmacist: Victory Dakin, PharmD    Phone/Mobile Heartbeat: MHB

## 2020-10-22 NOTE — Progress Notes
Inpatient Diabetes Management Team Follow-up Note    Over the past 24 hours, the patient's glucose control has been at or close to goal     ? Current Nutritional Status:  Continuous tube feeding with Vital AF 1.2@ 95cc/hr   Total provides: 2736 kcal, 171 gm protein, 252 gm CHO (10.5 gm/hr), and 1849 mL free H2O  ? Current Anti-hyperglycemic Regimen:  glargine 20 unit q noon, regular insulin 24 units q 6 hours, regular insulin MID dose sliding scale q 6 hours  ?  Home Anti-hyperglycemic Regimen: Toujeo U300 64 units daily, Apidra 20 units TID with meals, metformin 1000 mg bid   ? Other Contributing Medications: Dextrose 5% infusion started at 12:00PM    PE:    Patient laying in bed in NAD  Temp:  [97.3 ?F (36.3 ?C)-98.1 ?F (36.7 ?C)] 98.1 ?F (36.7 ?C)  Pulse:  [68-110] 84  Resp:  [0-35] 26  BP: (85-154)/(44-77) 125/49  SpO2:  [99 %-100 %] 100 %  Device (Oxygen Therapy): mechanical ventilator     Wt Readings from Last 3 Encounters:   10/22/20 134 kg   10/06/20 (!) 139.5 kg   09/29/20 (!) 142.4 kg       Data Review:  BGs:      Creatinine (mg/dL)   Date Value   47/82/9562 2.30 (H)     Hemoglobin A1c (%)   Date Value   10/10/2020 9.0 (H)       Assessment:  55 yo h/o type 2 diabetes, HTN, Waldenstroms macroglobulinemia, ETOH, laryngeal SCC s/p trach/PEG presented with SOB significant for tumor invading the trach stoma with partial obstruction.  Diabetes team consulted for assistance in glycemic management.      BG over the past 24 hours have been slightly above goal. He remains on continuous tube feeding at goal rate. Advise slight increase in bolus insulin. Prefer to increase bolus over basal insulin since want to minimize risk of hypoglycemia if TF are interrupted. Noted patient started on D5 @ 50 mL/hr mid-day for hypernatremia.  Discussed with covering provider, J. Hinchey, APRN.  Given patient on continuous tube feedings, target blood glucose between 140mg /dl-180mg /dL.  If bolus regular insulin is administered and TFs are held patient is at risk of developing hypoglycemia. Please check BG q1 hour x6 hours and start IV fluids with D5 if BG < 100 mg/dL      Recommendations:   1. Continue glargine 20 units q noon   2. Increase regular insulin to 26 units q 6 hours -- HOLD if tube feeds stopped or discontinued   3. Continue regular insulin mid dose sliding scale q 6 hours   4. Continue blood glucose monitoring q 6 hours   5. Diet: tube feeds per nutrition       Please let us know if you have any questions or concerns about our recommenations. We will continue to follow the patient along with you during his hospitalzation as we attempt to optimize his glycemic control.  Detailed recommendations were communicated to the primary team both verbally and/or through this written note.  I spent a total of 20 minutes with the patient of which 15 minutes (over 50%) was spent in counseling the patient regarding diabetes management and coordinating care with the primary team.     Signed:  Everlene Farrier, APRN  Inpatient Diabetes Management Team   [facility]: [phone]  Diabetes consult pager # [phone]      NOTE: During nights, weekends, and holidays, please contact the on-call  Endocrine Fellow at pager [phone].

## 2020-10-22 NOTE — Plan of Care
[facility]		Location: 33 SICU/7112-A54 y.o., male			Attending: Alric Quan, MD	Admit Date: 10/09/2020		[id number] LOS: 13 days FOLLOW UP NUTRITION ASSESSMENT DIET ORDER: Tube Feed No Tray --> Vital AF 1.2 @ 95 mL/hrANTHROPOMETRICS:Height: 74Admit wt: 136.2 kg, BMI: 38.6 kg/m^2Current wt: 134 kg, BMI: 37.9 kg/m^2Dosing wt (IBW): 95 kgAdditional wt information:Ht confirmed with pt: yes. Based on EMR wt hx, pt down 8.4 kg (5.8%) over the past three months, not considered significant for time frame. Fluctuations in wt may be r/t fluid status vs scale variance. Per flowsheets, 2-3+ (mild-moderate) edema present. Will continue to follow. Wt hx with the lowest wt from each available month over the past year: 09/22/20: 137.8 kg12/01/21: 143.2 kgESTIMATED NUTRITION REQUIREMENTS:Kcal/day: 2850	(30 kcal/kg)Protein/day: 162	(1.7 gm/kg) Fluids/day: Fluid management per medical team discretion. ~2580 mL (30 mL/kcal) Needs based on: dosing wt (95 kg), CA w/mets, AKI/CKD, PNA, impaired skinNUTRITION ASSESSMENT: Chart reviewed for change in TF formula. Pt seen trached to vent and sedated (off propofol). TF infusing @ 95 mL/hr (goal rate). Pt has received 81% of goal enteral volume since last assessment. TF switched from Diabetisource to Vital AF to provide an elemental formula due to diarrhea. Rectal tube placed on 2/15. Banatrol ordered TID. Carb difference between both formulas is minimal. DM team on board for insulin management. Agree with current regimen. Continue as ordered. Cultural/Religious/Ethnic Needs: NoFood Allergies: None per pt NUTRITION DIAGNOSIS:Inadequate oral intake related to medical status as evidenced by NPO. --- continues INTERVENTIONS/RECOMMENDATIONS: Meals and Snacks:- Diet advancement per team/SLPGoal: Patient will have diet advanced or initiate nutrition support prior to next nutrition assessment. --- met, goal d/cEnteral Nutrition:- Vital AF 1.2 @ 95 mL/hr x 24 hrs - Total provides: 2736 kcal, 171 gm protein, 252 gm CHO (10.5 gm/hr), and 1849 mL free H2O- Additional free water boluses per team Goal: Patient will receive >90% of goal volume TF prior to next nutrition assessment. --- not met, continuesDischarge Planning and Transfer of Nutrition Care:  Nutrition related discharge needs still being determined at this time, will continue to follow.MONITORING / EVALUATION:Food/Nutrition-Related History:Food and Nutrient IntakeEnteral Nutrition IntakeAnthropometric Measurements:Weight Biochemical Data, Medical Tests and Procedures:Electrolyte and renal profileGlucose/endocrine profileNutrition-Focus Physical FindingsOverall findingsElectronically signed by Suzie Portela, MS, RD, CNSC, CDN on February 17, 2022[facility]: [phone] note: Nutrition is a consult only service on weekends and holidays.  Please enter a consult in EPIC if assistance is needed on a weekend or holiday or via [facility] [phone] to contact the covering RD.

## 2020-10-22 NOTE — Plan of Care
Plan of Care Overview/ Patient Status    Pt A&Ox4. Using voice app for communication. PERRL. MAE. Precedex at 0.4 with good effect. Anxious and tearful at times. All interventions, POC, and pt's questions answered. Emotional support provided. Pt denies pain and CPOT 0. No pain meds given. Pt slept well overnight. Afebrile. HR 70-80's, NSR. MAP >65, self maintained. B LS diminished and coarse. Vent CPAP 10/5. Suctioned for large amount of tan, thick secretions. Suctioned q3 hours. Abd soft and non-tender. Rectal tube with small, loose output. TF continue at goal rate via PEG tube. Straight cathed x1 for clear yellow urine. T&P q2hours. All interventions and POC explained to pt. See flowsheets for further information.

## 2020-10-22 NOTE — Progress Notes
Radiation Oncology Note:Mr. Arthur [name] was evaluated earlier in the day, and at that time, he had been placed back on 2 liter respiratory support and was feeling much better.  He did elect to proceed with further radiotherapy understanding after discussion with his team that we could induce further sedation with propofol to help him tolerate radiotherapy given he is now ventilated again.Was subsequently able to tolerate his 1st session of radiotherapy in the afternoon.  Presently, we anticipate an additional 5 treatments that will be delivered on a Monday through Friday basis, thus the last of the 6 treatments if there are no further treatment interruptions could be delivered Wednesday, February 23rd.  Additional radiotherapy could be considered down the line if he is not a candidate for chemotherapy at that time.  It is unclear from a new oncologic perspective why he has increased work of breathing but if there is some component of compression, radiotherapy may help relieve some compression that will help ease his respiratory status and help improve his performance status.Presently, we will coordinate with the ICU Team regarding treatment times in order to allow for appropriate sedation to complete treatments.  Ultimately, if the patient is able to lie flat with an immobilization mask in place without sedation, he may not require such extensive measures for treatment however he was unable to tolerate treatment earlier this week.  We still recommend goals of care discussion as it is uncertain if he will be able to tolerate systemic therapy in the near future.*This note was composed using dictation software, and transcription errors may occur in the body of the text above.Misk Galentine Rasar Maple Hudson, MD/PhDAssistant ProfessorRadiation Oncology

## 2020-10-22 NOTE — Telephone Encounter
At Arthur [name] request, I reached out to his sister, Arthur [name] [phone]), about his care. I answered her questions about his hospital course thus far and addressed his goals going forward in the setting of his expressed frustration this morning. She stated that the patient would ideally like to return home if able which he is currently unable to do while he is on the ventilator. We made a plan to continue these conversations through early next week when the patient finishes his radiation sessions to evaluate their efficacy. [name] also requested that I reach out to his niece, Arthur [name] [phone]), to update her on his hospital course. Unfortunately, I was not able to reach her at this time via phone.Jeanmarie Plant, APRNSICU [phone]

## 2020-10-22 NOTE — Other
[name]    [facility]     Palliative Care Follow-Up Note    Present History   Chart reviewed and case discussed with SICU team. Arthur [name] was being examined by Psych when I attempted to evaluate him this morning due to concerns for suicidal ideation. This afternoon, he was sedated on propofol following radiation treatment. Appreciate collateral provided by nursing, who report that Arthur [name] sister Arthur [name] and niece Arthur [name] are requesting a family meeting, expressing concern about his plan of care. Covering provider to speak with Arthur [name] family this afternoon. Per discussion with SICU team, Arthur [name] has reportedly been expressing a wish to discontinue treatments and be allowed to die. However, he has also expressed a goal of returning home, and it is felt that completing radiation offers the most realistic path toward him being able to do so. Nursing conveying concerns that Arthur [name] is suffering. Of note, he is requiring significant intervention (sedation with propofol) to allow him to tolerate radiation. Tentative plan for Palliative Care to follow up with Arthur [name] and his family tomorrow.    Review of Allergies/Hx/Meds   Allergies  Patient has No Known Allergies.    Medical History  Past Medical History:   Diagnosis Date   ? Diabetes mellitus (HC Code) (HC CODE)    ? Hypercholesteremia    ? Hypertension        Family History  Family History   Problem Relation Age of Onset   ? Aneurysm Mother        Social History  Social History     Socioeconomic History   ? Marital status: Legally Separated     Spouse name: Not on file   ? Number of children: Not on file   ? Years of education: Not on file   ? Highest education level: Not on file   Occupational History   ? Not on file   Tobacco Use   ? Smoking status: Former Smoker     Packs/day: 1.00     Years: 30.00     Pack years: 30.00     Quit date: 03/04/2013     Years since quitting: 7.6   Substance and Sexual Activity   ? Alcohol use: Yes     Alcohol/week: 14.0 standard drinks     Types: 14 Cans of beer per week   ? Drug use: Not on file   ? Sexual activity: Not on file   Other Topics Concern   ? Not on file   Social History Narrative   ? Not on file     Social Determinants of Health     Financial Resource Strain:    ? Difficulty of Paying Living Expenses:    Food Insecurity:    ? Worried About Programme researcher, broadcasting/film/video in the Last Year:    ? Barista in the Last Year:    Transportation Needs:    ? Freight forwarder (Medical):    ? Lack of Transportation (Non-Medical):    Physical Activity:    ? Days of Exercise per Week:    ? Minutes of Exercise per Session:    Stress:    ? Feeling of Stress :    Social Connections:    ? Frequency of Communication with Friends and Family:    ? Frequency of Social Gatherings with Friends and Family:    ? Attends Religious Services:    ? Active Member of  Clubs or Organizations:    ? Attends Engineer, structural:    ? Marital Status:    Intimate Partner Violence:    ? Fear of Current or Ex-Partner:    ? Emotionally Abused:    ? Physically Abused:    ? Sexually Abused:        InPatient Medications  Current Facility-Administered Medications   Medication Dose Route Frequency Last Rate   ? acetaminophen  650 mg Per G Tube Q6H PRN     ? albuterol  2.5 mg Nebulization Q6H WA     ? banana flakes-t-galactooligos.  1 packet Oral TID     ? cefTAZIDime  2 g Intravenous Q12H     ? chlorhexidine gluconate  15 mL Mouth/Throat Q12H     ? dexmedetomidine  0.2-0.7 mcg/kg/hr Intravenous Continuous 0.6 mcg/kg/hr (10/22/20 1000)   ? dextrose 5 %  50 mL/hr Intravenous Continuous     ? dextrose 50% in water (D50W)  12.5 g IV Push Q15 MIN PRN     ? dextrose 50% in water (D50W)  25 g IV Push Q15 MIN PRN     ? gabapentin  100 mg Per NG tube Q8H     ? heparin (porcine)  5,000 Units Subcutaneous Q8H     ? HYDROmorphone  0.5 mg IV Push Q4H PRN     ? insulin glargine  20 Units Subcutaneous Daily     ? insulin regular human  24 Units Subcutaneous Q6H     ? insulin regular human   Subcutaneous Q6H     ? LORazepam  1 mg Per G Tube Q6H PRN     ? oxyCODONE  5 mg Per G Tube Q4H PRN      Or   ? oxyCODONE  10 mg Per G Tube Q4H PRN     ? phenylephrine  0.25-9 mcg/kg/min Intravenous Continuous Stopped (10/21/20 1741)   ? rocuronium         ? traZODone  25 mg Per G Tube Nightly     ? Vancomycin MAR level   Intravenous Once     ? Vancomycin Therapy Placeholder   Intravenous placeholder     ? zinc oxide  1 Application Topical (Top) DAILY PRN         [email or web address]    Palliative Performance Scale 30%   (Modification of Karnofsky and was designed for measurement of physical status in Palliative Care. Only 10% of patients with PPS score <=50% would be expected to survive >6 months)    Physical Exam  Vitals and nursing note reviewed.   Constitutional:       Appearance: He is ill-appearing.      Interventions: He is sedated.   Pulmonary:      Effort: No respiratory distress.      Comments: Trached to vent  Neurological:      Comments: Sedated         Vitals:  Temp:  [97.3 ?F (36.3 ?C)-98.1 ?F (36.7 ?C)] 98.1 ?F (36.7 ?C)  Pulse:  [68-101] 74  Resp:  [0-35] 24  BP: (85-154)/(44-77) 106/56  SpO2:  [99 %-100 %] 99 %     Wt Readings from Last 3 Encounters:   10/22/20 134 kg   10/06/20 (!) 139.5 kg   09/29/20 (!) 142.4 kg       Review of Labs/Diagnostics  Complete Blood Count:  Recent Labs   Lab 10/19/20  0455 10/20/20  0606 10/21/20  0521   WBC  8.6 8.8 8.8   HGB 7.8* 7.7* 8.3*   HCT 25.80* 24.70* 28.20*   PLT 52* 60* 42*     Comprehensive Metabolic Panel:  Recent Labs   Lab 10/20/20  0606 10/20/20  0610 10/21/20  0521 10/21/20  0523 10/21/20  2311 10/22/20  0543 10/22/20  0544   NA 146*  --  147*  --   --  151*  --    K 4.5  --  4.3  --   --  4.5  --    CL 105  --  104  --   --  108*  --    CO2 31*  --  33*  --   --  31*  --    BUN 60*  --  60*  --   --  59*  --    CREATININE 2.29*  -- 2.26*  --   --  2.30*  --    GLU 147*   < > 275*   < >   < > 183* 170*   CALCIUM 9.8  --  10.1  --   --  9.1  --     < > = values in this interval not displayed.     No results for input(s): ALKPHOS, BILITOT, BILIDIR, PROT, ALT, AST in the last 168 hours.    Invalid input(s): LABALBU    Diagnostics:  XR Chest PA or AP    Result Date: 10/21/2020  XR CHEST PA OR AP Date: 10/21/2020 9:28 AM INDICATION: increased work of breathing COMPARISON: XR CHEST PA OR AP 2022-Feb-09 FINDINGS: Support Devices: Unchanged. Low lung volumes. Left basilar consolidation and small pleural effusion. No pulmonary edema or pneumothorax. Stable cardiomediastinal silhouette.     Left basilar consolidation and small pleural effusion.  Reported And Signed By: Maudie Flakes, MD  [facility]        Impression/Plan:   Arthur [name] is a 55 y.o. 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 Admitted to SICU on 2/9 for ventilator management following RRT on oncology floor for desaturation and increased work of breathing while on trach mask, ultimately placed back on vent. Referred for Palliative Care Consult by Attending Provider: Alric Quan, MD [phone] for supportive care.    Cancer-related pain:  - Oxycodone 5 mg PO q3hr PRN moderate pain OR oxycodone 10 mg PO q3hr PRN severe pain  - Dilaudid 0.5 mg IV q4hr PRN pain refractory to oral regimen     Anxiety  - Defer management to Psych, recommending standing and PRN Klonopin    Coping/Support:   - Ongoing assessments for psychosocial and/or spiritual needs  - Full interdisciplinary team support available as needed  - Appreciate primary team advocating with nurse management for an exemption to visitor restrictions to allow 2 visitors, as patient's sister reports mobility challenges making visitation on her own difficult  - Appreciate primary team and nursing helping facilitate communication between patient and his sister and niece    Advance Care Planning:  - Full code  - No advance directives or health care proxy documentation on file  - His sister and niece are listed as emergency contacts and it appears that his sister is next of kin and default proxy decision maker in the event that patient lacks capacity    Goals of  Care:  - Unable to re-address with patient today  - Per discussion with SICU team, patient has expressed a wish to be able to return home, and it is felt that continuing with RT offers him the best opportunity for being able to meet this goal.  - Family have requested meeting with multidisciplinary team. SICU team aware and will consider as appropriate in coming days. We remain available to assist with these discussions.    Thank you for involving the Palliative Care team in the care of this patient. The Palliative Care team will continue to follow.    Patient was evaluated: in person    Time committed: > 25 minutes were spent in chart review, coordination of care, and at the bedside in patient/family counseling and education, including detailed concepts of symptom managment and goals of care/advanced care planning.    Signed:  Debbrah Alar, APRN  Palliative Care Consult Service  10/22/2020 10:50 AM

## 2020-10-22 NOTE — Plan of Care
Plan of Care Overview/ Patient Status    SOCIAL WORK NOTEPatient Name: Arthur RaleighMedical Record Number: [id number] Date of Birth: 30-May-1967Social Work Follow Up    Most Recent Value Document Type Progress Note Reason for Current Social Work Involvement Care Decision Concerns, Psycho-Social Concerns Intervention Decision-making Assessment and Intervention Decision-Making Assessment and Intervention Advanced directive, Assess support system Source of Information Family/Caregiver Record Reviewed Yes Level of Care Inpatient Identified Clinical/Disposition, Issues/Barriers: Per Social Work Service consult - Sister requesting consult on being POA. Intervention(s)/Summary 15 minutes were spent face to face with patient's sister Arthur [name] [phone]) while patient was in treatment and in collaboration with RN, medical record review. Sister shared that Mr. Behnken is separated, has no children and they don't have the contact for patient's spouse. Sister identified herself as patient's primary contact and indicated that she is trying to plan ahead and wants to help Mr. Ratchford in managing his affairs. Sister noted that Mr. Limas niece - Arthur [name] is his another family contact. Sister spoke about her loss and what she and their family are going through and I provided empathetic listening, validation, guidance relating to POA, Advance Directives, bereavement support information and advised of Social Work Service availability for ongoing support should a need arise. Collaboration with Treatment Team/Community Providers/Family: Appreciated collaboration with patient's sister, RN, medical record review Outcome Resolved Handoff Required? No Next Steps/Plan (including hand-off): This consult was addressed. Please re-consult Social Work Service as needed. Signature: Bari Mantis, LCSW Contact Information: [phone], SW office # [phone]

## 2020-10-22 NOTE — Other
-    CONSULT  REQUEST  DOCUMENTATION  -  [facility] NOTE  -  Type of consult: [facility] Psychiatry   -  New Consult: [id number] [name] /Location: 7112/7112-A / Brief Clinical Question: Patient with laryngeal SCC receiving palliative radiation now endorsig SI./Callback Cell Phone: [phone] / Please confirm receipt of this message by texting back ?OK?  -  1 - Mobile Heartbeat message sent to [place], Brooke at 10:22 AM. Received response at 1025.  [name]  10/22/2020  10:22 AM  Consult [facility] [phone]

## 2020-10-22 NOTE — Plan of Care
Plan of Care Overview/ Patient Status    NEURO: Pt is AAOx4, very anxious, not redirectable, frequently tearful, emotional support provided, writing to this Rn multiple times that  I want to die, please let me go, sicu team made aware. Refusing all this AM, team made aware. MAE to command. Precedex continued this shift with good effect, switched to propofol for radiation and then placed back on precedex. ?ID: Vanc continued?RESP: Very tachypeic this shift, RR 20s, using accessory whn anxious. Pt has #8 cuffed bivona onCPAP 10/5, tolerated very well, Satting 95-100%. Switched to volume a/c when sedated. Frequent suctioning q1-2 hours for tan/pink tinged secretions. ?CV: HR 60-90s when anxious. Remains in Sinus tach. SBP 90-130s. Neo utilized for MAP goal of 65. Palpable pulses present. ?GI/GU: Abd is round and obese. + Bowel sounds. Peg tube in place, TF continued  vital AF at 44ml/hr. To be held x2 hours prior to Radiation this afternoon, per SICU team, started d5w @ 3ml/hr for hypernatremia. TF restarted when returned to floor.  Bladder scanned for , foley placed.  Bld sgr checks q6hr.  Rectal tube in place, thin liquid stool noted, bannatrol continued, c diff negative.?LINES: x1 #20g PIV to left. x1 #18g extended dwell to left arm and a right chest port. ?SKIN: Incontinent derm noted to scrotum and groin area as well as excoriation to coccyx, zinc applied. T&P q 2hrs. ?PLAN: To go for radiation this afternoon, on the ventilator and using the support of propofol to lay flat for procedure. Last day of radiation on 2/23?

## 2020-10-22 NOTE — Progress Notes
[place] [facility]	SICU Progress NoteAttending Provider: Alric Quan, MDPost-Operative Day/Post Injury Day Procedure(s):2/5: [name] exchange (ENT) Hospital LOS: 13 days Interim History:  HPI: 55 y.o. male admitted to the SICU on 2/9 for ventilator management following an RRT on the oncology floor for desaturation and increased work of breathing while on trach mask and ultimately placed back on vent. ?Patient was initially a transfer from [facility] on 2/4 after trach and PEG when he had a partial obstruction of trach site from a new necrotic mass. He underwent trach exchange 2/5. Was on trach mask on oncology floor however had 2 episodes on floor requiring RRT for lavage and likely mucous plugging. ?Course Complicated by: - Incidental R IJ thrombus - Multiple episodes of presumed mucous plugging requiring RRT's ?PMH: Chronic ITP, Waldenstrom macroglobulinemia, HTN. HLD, DM, tobacco and alcohol use, Laryngeal SCC (weekly Cisplatin and RT)?PSH: None known ?Home meds: Lipitor, Pepcid, Lisinopril, Metformin, Oxycodone, miralax, Insulin U100 and Lantus ?SICU course:2/9: Arrives to SICU HDS, on vent PCV. Appears well. Transitioned to Pressure support on vent and tolerating well. O/N: Episode of patient feeling like not getting a full breath, extremely tachypenic and increased work of breathing and tachycardia, attempted suctioning and lavage without improvement, attempted to switch to PCV and VAC but due to dyssynchrony unable to tolerate, no desat but poor ventilation and TV and ETCO2 up to 100. ENT at bedside do not feel additional scope would benefit as he was just scoped a few hours ago, ultimately gave fentanyl push and started propofol for vent synchrony but despite max prop still dyssynchronous. Gave one dose of roccuronium once sedated for vent synchrony with good effect. Albuterol and HTS neb given and peaks improved from 50 to 30's. No further paralytic needed and now synchronous on VAC. Kept on propofol for vent synchrony. CXR reassuring and back to GCS 11T. Hypotension only following sedation and Rocc, Low dose Neo started. 2/10: Kept on higher sedation in AM for [place] A/P (for staging) and soft tissue neck. Weaning sedation and will plan for PSV trial once more awake. Scoped by ENT in AM, airway patent. Q1-2hr suction requirements of thick tan secretions. No further episodes of respiratory distress although sedation remains high. Tube feeds changed to continuous given significant hyperglycemia. Insulin regimen changed per diabetes team recommendations. Pressure ulceration noted around tracheostomy --> ENT at bedside to evaluation, likely some component of invasive tumor growth. Palliative care consult for assist with symptom management and family moving forward. Primary oncologist office made aware of referral. O/N: weaning down on Prop, no episodes of respiratory distress or desats overnight. Febrile to 101.3, sputum and blood cultures ordered. 500cc LR bolus given for some oliguria and rising Cr. 2/11: Given 500cc NS for oliguria with good response. Vanco level yesterday 25, likely cause of worsening aki, on hold. UOP & lytes remains stable. Insulin adjusted per diabetes team. PSV as tolerated (doing well). CPT added. Confirmed with ENT pulm recs for blue portex suctionaid trach- no plans to switch at this time. Bowel regimen added. Plan keep on neo if low dose but if inc, consider changing to levo as likely then not in setting of sedation. Family updated on phone. Confirmed w heme- no AC rec for R IJ thrombus until plt > 50. Was going to attempt to place pt on TM overnight as he did very well on PSV x 3hrs, however, pt had anxiety attack regarding his cancer and prognosis, he requested to sleep during night and was agreeable to TM in  am. O/N: Patient agrees to PSV overnight which he did very well on. Agreeable to getting OOB to chair and possibly trach mask in AM but wants to stay on vent tonight. Off Neo by AM.  2/12: trial trach mask as tolerated this AM. Adjusted insulin regimen per the diabetes management service. Sputum now with staph, awaiting sensitivities. More loose stools today; nursing placed rectal tube and held bowel regimen.  ON: Anxious at times, feeling like he cannot breath but doing well on TM. Frequent suctioning. Increased standing. FIO2 increased to 40%.  insulin to 14U per endo reccs. 2/13: continue to adjust insulin regimen, now 20u regular q6. Cr continues to uptrend 2.57 --> 2.63 and repeat vanco level still elevated 24.6; pharmacy to continue dosing adjustments. Otherwise, remains on trach mask. Sputum with MRSA --> d/c ceftriaxone. ON: increased standing insulin to 24U. 2/14: Will discuss [facility] plan with hematology now that PLT > 50 x2 checks. Will also d/c vancomycin today (10d of therapy thus far). AKI appears to have peaked at 2.57 -> 2.63 -> 2.47. Awaiting transfer to the oncology service on NP 15. Okay to start [facility], but will hold off today given more bloody secretions. 2/15: Plan to start palliative RT today. AM session aborted due to extreme anxiety (despite premedication with IV ativan), tried again in the PM with 10mg  IV Valium + 3mg  IV ativan, procedure again aborted due to extreme anxiety and inability to tolerate. Pending discussion with rad onc in AM re: utility of ongoing attempts at RT. Pending transfer to NP15 on med onc service.  O/N: increasingly anxious / restless, but able to re-direct and calm down with PRN ativan. Liquid stools, placed rectal tube. Will start banatrol and check CBC in AM, check c dif. This had previously improved by holding bowel regimen.2/16: Acutely increased WOB, HR 130s, SBP 170s --> ENT bedside scope with trach patent, though notable secretions --> suctioned with minimal improvement; placed back on ventilator PSV 10 / +5 with immediate improvement in VS and respiratory distress. Precedex gtt started to assist more frequent suctioning. CXR with LLL consolidation --> resend sputum, and restart Vanco (close trough monitoring with AKI) given known MRSA PNA. Increased diarrhea, cdiff negative, transition to Vital TF. LR 500cc for hemoconcentration of labs, tachycardia. Appreciate radiation oncology; sedated patient with Propofol gtt, bolus x1 with good effect --> able to tolerate 15 minute radiation session; plan to repeat Thursday, Friday, Monday, and Tuesday. 2/17: Psych consulted for depression--> Doxepin added, Klonopin added. Hypernatremia--> D5W and free water flushes added. Ceftaz added for GNR in sputum. Plan for family discussion this PM and radiation this PM. Foley replaced for retention.Review of Allergies/Meds/Hx: I have reviewed the patient's: allergies, past medical history, past surgical history, family history, social history, prior to admission medicationsObjective: Vitals:I have reviewed the patient's current vital signs as documented in the EMRTemp:  [97.3 ?F (36.3 ?C)-98.1 ?F (36.7 ?C)] 98.1 ?F (36.7 ?C)Pulse:  [68-110] 84Resp:  [0-35] 26BP: (85-154)/(44-77) 125/49SpO2:  [99 %-100 %] 100 %SpO2: 100 %Device (Oxygen Therapy): mechanical ventilatorI/O's:I have reviewed the patient's current I&O's as documented in the EMR.Gross Totals (Last 24 hours) at 10/22/2020 [id number] data filed at 10/22/2020 0800Intake 3634.2 ml Output 1475 ml Net 2159.2 ml Physical ExamConstitutional:     General: Anxious tearful gentleman appearing older than stated age. HENT:    Head: Normocephalic and atraumatic.    Mouth: Mucous membranes are moist.    Extraocular Movements: Extraocular movements intact.    Pupils: Pupils are equal, round, and  reactive to light.    Comments: Tracheostomy in place, with surrounding sanguinous ooze intermittently, stoma SQ tissue exposed.Cardiovascular:    Rate and Rhythm: Regular rhythm. Regular rate   Heart sounds: No murmur heard. Pulmonary:    Breath sounds: No wheezing, rhonchi or rales.    Comments: Mildly increased WOB and tachypnea, ventilated.Abdominal:    General: There is no distension.    Tenderness: There is no abdominal tenderness.    Comments: PEG tube in place  Genitourinary:   Comments: Foley in place draining clear, yellow urineMusculoskeletal:    Right lower leg: No edema.    Left lower leg: No edema.    Comments: Moves all extremities equally, to command.Skin:   General: Skin is warm and dry.    Capillary Refill: Capillary refill takes less than 2 seconds. Neurological:    General: No focal deficit present.    Mental Status: He is alert.    Cranial Nerves: No cranial nerve deficit.    Motor: No weakness. Labs: Refer to EMR. I have reviewed the patient's labs within the last 24 hours; see assessment and plan below for significant abnormals.Microbiology:Refer to EMR. I have reviewed all new results within the last 24 hours; see assessment and plan below.Diagnostics:Refer to EMR. I have reviewed all new results within the last 24 hours; see assessment and plan below.Chest ZO:XWRUE to EMR. I have reviewed all new results within the last 24 hours; see assessment and plan below.Abdominal AV:WUJWJ to EMR. I have reviewed all new results within the last 24 hours; see assessment and plan below.ECG/Tele Events: I have reviewed the patient's ECG and telemetry as resulted in the EMR.Assessment/Plan Neurologic: Analgesia- Tylenol 650 Q6 PRN - Gabapentin 100mg  Q8- Oxycodone 5 / 10mg  Q4 PRN - Dilaudid 0.5mg  Q4 PRN breakthroughAnxiety- Precedex gtt- Klonopin 0.5mg  BID + 0.5 Q12hours prn- Doxepin 10mg  QHS- Appreciate palliative care consult for symptom management, ?SSRI- Psych consulted, appreciate recs?ENT: L laryngeal SCC, invading tracheostomy: s/p trach exchange 10/10/20 and biopsy 10/12/20- Bivona trach to bypass obstruction - Unfortunately, there are no surgical / curative options- Unable to tolerate palliative radiation (2/15), well tolerated on 2/16 on ventilator with full propofol sedation, plan for repeat regimen for subsequent days?Cardiac: Sinus tachycardia, resolved- Multifactorial in the setting of extreme anxiety, WOB and need for standing albuterol nebs; possible hypovolemia in the setting of large stool and UOP, possible PE given DVT but unable to [facility]- may require additional fluid bolus, Precedex as aboveHx of HTN / HLD- Holding home lisinopril given AKI- Holding home lasix given AKI- hold home Crestor, patient refusing?Respiratory:  Respiratory failure- Tumor invasion of tracheal stoma; though trach is patent, tumor appears circumferential; also complicated by high secretion burden and frequent mucous plugging- Resume antibiotics as below- Palliative radiation as noted- Albuterol Q6- Interventional pulm following?GI: Diarrhea- Change TF to Vital @ 95- Banatrol added- Rectal tube in place- Cdiff negative?Renal: AKI on CKD (baseline Cr 1.5-1.6), 2/2 supratherapeutic vanco level- Cr peaked 2.63; slowly downtrending- Foley replaced 2/17 for urinary retentionHypovolemic hypernatremia- free water flushes- D5W @50cc /hr- trend Na?Infectious Disease: MRSA PNA- Vanco (2/5 - 2/14), resume (2/16 - ?)- Ceftaz added (2/17-) added for 4+ GNR on 2/16- f/u sputum (2/16)?Hematology:Acute blood loss anemia- Sanguinous secretions, expected given tumor burden and coagulopathy, may worsen with radiation- Maintain hgb >7?Hx of chronic ITP, Waldenstrom macroglobulinemia- Transfuse platelets <50 if bleeding, <10 if no active bleeding- Appreciate hematology recommendations?R IJ thrombus - Noted on [place] (2/5, 2/10)- High risk heparin gtt when plt consistently >50, and sanguinous secretions  improve--> will hold given bleeding from tumor- Hep SC- Maintain SCDs at all times?Endocrine: Hx T2DM- Lantus 20u daily- Regular insulin 26u Q6- Regular MDISS- HOLD home Metformin, Lantus 64u, Apidra 20u TID w meals- Appreciate diabetes team recsMusculoskeletal: Generalized deconditioning- OOB daily, PT/OT evals- Turn and reposition Q2 hours, PUP dressing?Device Review:- R IJ Port (08/12/20) per guidelines, nonocclusive DVT does not require removal of line- PEG (09/14/20)- Bivona trach (10/10/20)?Code Status / Dispo:- Full code - Accepted to NP15 by the medical oncology service- Appreciate palliative care assistance with GOC discussionsNotifications : For questions please call SICU Covering Provider listed in [facility] / EpicSigned:Ellanora Rayborn, APRN2/17/2022SICU [facility] [employer] (secondary)[facility] 7-1 APP			[phone]

## 2020-10-22 NOTE — Plan of Care
Inpatient Physical Therapy Progress Note     IP Adult PT Eval/Treat - 10/22/20 1001          Date of Visit / Treatment    Date of Visit / Treatment 10/22/20     End Time 1001        General Information    Subjective trach     General Observations Supine in bed, NAD, RN cleared for activity, on monitor, IV, vent to trach     Precautions/Limitations fall precautions;bed alarm;chair alarm        Pain/Comfort    Pain Comment (Pre/Post Treatment Pain) no c/o pain        Patient Coping    Observed Emotional State accepting        Cognition    Level of Consciousness alert     Cognition Comments appropriate, follows commands        Skin Assessment    Skin Assessment See Nursing Documentation        Balance    Balance Skills Training Comment all mobility deferred 2/2 patient requiring to be bed level 2/2 sedation for radiation per RN. Will progress as able.        Handoff Documentation    Handoff Patient in bed;Bed alarm        Endurance    Endurance Comments fair-        PT- AM-PAC - Basic Mobility Screen- How much help from another person do you currently need.....    Turning from your back to your side while in a a flat bed without using rails? 3 - A Little - Requires a little help (supervision, minimal assistance). Can use assistive devices.     Moving from lying on your back to sitting on the side of a flat bed without using bed rails? 3 - A Little - Requires a little help (supervision, minimal assistance). Can use assistive devices.     Moving to and from a bed to a chair (including a wheelchair)? 1 - Total - Requires total assistance or cannot do it at all.     Standing up from a chair using your arms(e.g., wheelchair or bedside chair)? 1 - Total - Requires total assistance or cannot do it at all.     To walk in a hospital room? 1 - Total - Requires total assistance or cannot do it at all.     Climbing 3-5 steps with a railing? 1 - Total - Requires total assistance or cannot do it at all.     AMPAC Mobility Score 10 TARGET Highest Level of Mobility Mobility Level 4, Transfer to chair        Therapeutic Exercise    Therapeutic Exercise Comments Pt performed bed level AROM and RROM x 4 extremities he is motivated and strong        Clinical Impression    Follow up Assessment Pt is able to tolerate bed level activity 2/2 RN restriction for sedation required for upcoming radiation. PT will continue to monitor and progress as able.        Patient/Family Stated Goals    Patient/Family Stated Goal(s) unable to state        Frequency/Equipment Recommendations    PT Frequency 3x per week     What day of week is next treatment expected? Friday   AM       PT Recommendations for Inpatient Admission    Activity/Level of Assist out of bed;transfers only;assist of 2  Positioning chair position of the bed     Therapeutic Exercise ROM as tolerated        PT Discharge Summary    Physical Therapy Disposition Recommendation Rehab at [facility] PT, DPT [phone]

## 2020-10-23 ENCOUNTER — Encounter: Admit: 2020-10-23 | Payer: PRIVATE HEALTH INSURANCE

## 2020-10-23 ENCOUNTER — Ambulatory Visit: Admit: 2020-10-23 | Payer: PRIVATE HEALTH INSURANCE

## 2020-10-23 ENCOUNTER — Ambulatory Visit: Admit: 2020-10-23 | Payer: PRIVATE HEALTH INSURANCE | Attending: Radiation Oncology

## 2020-10-23 DIAGNOSIS — E7800 Pure hypercholesterolemia, unspecified: Secondary | ICD-10-CM

## 2020-10-23 DIAGNOSIS — J9601 Acute respiratory failure with hypoxia: Secondary | ICD-10-CM

## 2020-10-23 DIAGNOSIS — E119 Type 2 diabetes mellitus without complications: Secondary | ICD-10-CM

## 2020-10-23 DIAGNOSIS — I10 Essential (primary) hypertension: Secondary | ICD-10-CM

## 2020-10-23 LAB — BASIC METABOLIC PANEL
BKR ANION GAP: 14 (ref 7–17)
BKR BLOOD UREA NITROGEN: 55 mg/dL — ABNORMAL HIGH (ref 6–20)
BKR BUN / CREAT RATIO: 25.1 — ABNORMAL HIGH (ref 8.0–23.0)
BKR CALCIUM: 8.8 mg/dL (ref 8.8–10.2)
BKR CHLORIDE: 106 mmol/L (ref 98–107)
BKR CO2: 28 mmol/L (ref 20–30)
BKR CREATININE: 2.19 mg/dL — ABNORMAL HIGH (ref 0.40–1.30)
BKR EGFR (AFR AMER): 38 mL/min/{1.73_m2} (ref >60)
BKR EGFR (NON AFRICAN AMERICAN): 31 mL/min/{1.73_m2} (ref >60)
BKR GLUCOSE: 162 mg/dL — ABNORMAL HIGH (ref 70–100)
BKR POTASSIUM: 4.8 mmol/L (ref 3.3–5.3)
BKR SODIUM: 148 mmol/L — ABNORMAL HIGH (ref 136–144)

## 2020-10-23 LAB — MAGNESIUM: BKR MAGNESIUM: 2.3 mg/dL (ref 1.7–2.4)

## 2020-10-23 LAB — CBC WITH AUTO DIFFERENTIAL
BKR WAM ABSOLUTE IMMATURE GRANULOCYTES.: 0.08 x 1000/ÂµL (ref 0.00–0.30)
BKR WAM ABSOLUTE LYMPHOCYTE COUNT.: 0.33 x 1000/ÂµL — ABNORMAL LOW (ref 0.60–3.70)
BKR WAM ABSOLUTE NRBC (2 DEC): 0 x 1000/ÂµL (ref 0.00–1.00)
BKR WAM ANALYZER ANC: 6.06 x 1000/ÂµL (ref 2.00–7.60)
BKR WAM BASOPHIL ABSOLUTE COUNT.: 0.02 x 1000/ÂµL (ref 0.00–1.00)
BKR WAM BASOPHILS: 0.3 % (ref 0.0–1.4)
BKR WAM EOSINOPHIL ABSOLUTE COUNT.: 0.37 x 1000/ÂµL (ref 0.00–1.00)
BKR WAM EOSINOPHILS: 4.9 % (ref 0.0–5.0)
BKR WAM HEMATOCRIT (2 DEC): 20.6 % — ABNORMAL LOW (ref 38.50–50.00)
BKR WAM HEMOGLOBIN: 6.3 g/dL — CL (ref 13.2–17.1)
BKR WAM IMMATURE GRANULOCYTES: 1.1 % — ABNORMAL HIGH (ref 0.0–1.0)
BKR WAM LYMPHOCYTES: 4.4 % — ABNORMAL LOW (ref 17.0–50.0)
BKR WAM MCH (PG): 28.3 pg (ref 27.0–33.0)
BKR WAM MCHC: 30.6 g/dL — ABNORMAL LOW (ref 31.0–36.0)
BKR WAM MCV: 92.4 fL (ref 80.0–100.0)
BKR WAM MONOCYTE ABSOLUTE COUNT.: 0.7 x 1000/ÂµL (ref 0.00–1.00)
BKR WAM MONOCYTES: 9.3 % (ref 4.0–12.0)
BKR WAM MPV: 12.7 fL — ABNORMAL HIGH (ref 8.0–12.0)
BKR WAM NEUTROPHILS: 80 % — ABNORMAL HIGH (ref 39.0–72.0)
BKR WAM NUCLEATED RED BLOOD CELLS: 0 % (ref 0.0–1.0)
BKR WAM PLATELETS: 57 x1000/ÂµL — ABNORMAL LOW (ref 150–420)
BKR WAM RDW-CV: 16.8 % — ABNORMAL HIGH (ref 11.0–15.0)
BKR WAM RED BLOOD CELL COUNT.: 2.23 M/ÂµL — ABNORMAL LOW (ref 4.00–6.00)
BKR WAM WHITE BLOOD CELL COUNT: 7.6 x1000/ÂµL (ref 4.0–11.0)

## 2020-10-23 LAB — PHOSPHORUS     (BH GH L LMW YH): BKR PHOSPHORUS: 4.3 mg/dL (ref 2.2–4.5)

## 2020-10-23 LAB — IMMATURE PLATELET FRACTION (BH GH LMW YH)
BKR WAM IPF, ABSOLUTE: 3.4 x1000/ÂµL (ref <20.0)
BKR WAM IPF: 5.9 % (ref 1.2–8.6)

## 2020-10-23 LAB — VANCOMYCIN, RANDOM     (BH GH LMW YH): BKR VANCOMYCIN RANDOM: 13.4 ug/mL

## 2020-10-23 MED ORDER — PROPOFOL 10 MG/ML INTRAVENOUS EMULSION
10 mg/mL | Status: DC
Start: 2020-10-23 — End: 2020-10-23

## 2020-10-23 MED ORDER — PROPOFOL 10 MG/ML INTRAVENOUS EMULSION
10 mg/mL | Status: CP
Start: 2020-10-23 — End: ?
  Administered 2020-10-23: 17:00:00 10 mL/h via INTRAVENOUS

## 2020-10-23 MED ORDER — PHENYLEPHRINE 40 MG/250 ML (160 MCG/ML) IN 0.9 % SODIUM CHLORIDE IV
40 mg/250 mL (160 mcg/mL) | Status: CP
Start: 2020-10-23 — End: ?
  Administered 2020-10-23: 18:00:00 40 mL/h via INTRAVENOUS

## 2020-10-23 NOTE — Progress Notes
[facility]	SICU Progress NoteAttending Provider: Alric Quan, MDPost-Operative Day/Post Injury Day Procedure(s):2/5: [name] (ENT) Hospital LOS: 14 days Interim History:  HPI: 55 y.o. male admitted to the SICU on 2/9 for ventilator management following an RRT on the oncology floor for desaturation and increased work of breathing while on trach mask and ultimately placed back on vent. ?Patient was initially a transfer from [facility] on 2/4 after trach and PEG when he had a partial obstruction of trach site from a new necrotic mass. He underwent trach exchange 2/5. Was on trach mask on oncology floor however had 2 episodes on floor requiring RRT for lavage and likely mucous plugging. ?Course Complicated by: - Incidental R IJ thrombus - Multiple episodes of presumed mucous plugging requiring RRT's ?PMH: Chronic ITP, Waldenstrom macroglobulinemia, HTN. HLD, DM, tobacco and alcohol use, Laryngeal SCC (weekly Cisplatin and RT)?PSH: None known ?Home meds: Lipitor, Pepcid, Lisinopril, Metformin, Oxycodone, miralax, Insulin U100 and Lantus ?SICU course:2/9: Arrives to SICU HDS, on vent PCV. Appears well. Transitioned to Pressure support on vent and tolerating well. O/N: Episode of patient feeling like not getting a full breath, extremely tachypenic and increased work of breathing and tachycardia, attempted suctioning and lavage without improvement, attempted to switch to PCV and VAC but due to dyssynchrony unable to tolerate, no desat but poor ventilation and TV and ETCO2 up to 100. ENT at bedside do not feel additional scope would benefit as he was just scoped a few hours ago, ultimately gave fentanyl push and started propofol for vent synchrony but despite max prop still dyssynchronous. Gave one dose of roccuronium once sedated for vent synchrony with good effect. Albuterol and HTS neb given and peaks improved from 50 to 30's. No further paralytic needed and now synchronous on VAC. Kept on propofol for vent synchrony. CXR reassuring and back to GCS 11T. Hypotension only following sedation and Rocc, Low dose Neo started. 2/10: Kept on higher sedation in AM for [place] A/P (for staging) and soft tissue neck. Weaning sedation and will plan for PSV trial once more awake. Scoped by ENT in AM, airway patent. Q1-2hr suction requirements of thick tan secretions. No further episodes of respiratory distress although sedation remains high. Tube feeds changed to continuous given significant hyperglycemia. Insulin regimen changed per diabetes team recommendations. Pressure ulceration noted around tracheostomy --> ENT at bedside to evaluation, likely some component of invasive tumor growth. Palliative care consult for assist with symptom management and family moving forward. Primary oncologist office made aware of referral. O/N: weaning down on Prop, no episodes of respiratory distress or desats overnight. Febrile to 101.3, sputum and blood cultures ordered. 500cc LR bolus given for some oliguria and rising Cr. 2/11: Given 500cc NS for oliguria with good response. Vanco level yesterday 25, likely cause of worsening aki, on hold. UOP & lytes remains stable. Insulin adjusted per diabetes team. PSV as tolerated (doing well). CPT added. Confirmed with ENT pulm recs for blue portex suctionaid trach- no plans to switch at this time. Bowel regimen added. Plan keep on neo if low dose but if inc, consider changing to levo as likely then not in setting of sedation. Family updated on phone. Confirmed w heme- no AC rec for R IJ thrombus until plt > 50. Was going to attempt to place pt on TM overnight as he did very well on PSV x 3hrs, however, pt had anxiety attack regarding his cancer and prognosis, he requested to sleep during night and was agreeable to TM in  am. O/N: Patient agrees to PSV overnight which he did very well on. Agreeable to getting OOB to chair and possibly trach mask in AM but wants to stay on vent tonight. Off Neo by AM.  2/12: trial trach mask as tolerated this AM. Adjusted insulin regimen per the diabetes management service. Sputum now with staph, awaiting sensitivities. More loose stools today; nursing placed rectal tube and held bowel regimen.  ON: Anxious at times, feeling like he cannot breath but doing well on TM. Frequent suctioning. Increased standing. FIO2 increased to 40%.  insulin to 14U per endo reccs. 2/13: continue to adjust insulin regimen, now 20u regular q6. Cr continues to uptrend 2.57 --> 2.63 and repeat vanco level still elevated 24.6; pharmacy to continue dosing adjustments. Otherwise, remains on trach mask. Sputum with MRSA --> d/c ceftriaxone. ON: increased standing insulin to 24U. 2/14: Will discuss [facility] plan with hematology now that PLT > 50 x2 checks. Will also d/c vancomycin today (10d of therapy thus far). AKI appears to have peaked at 2.57 -> 2.63 -> 2.47. Awaiting transfer to the oncology service on NP 15. Okay to start [facility], but will hold off today given more bloody secretions. 2/15: Plan to start palliative RT today. AM session aborted due to extreme anxiety (despite premedication with IV ativan), tried again in the PM with 10mg  IV Valium + 3mg  IV ativan, procedure again aborted due to extreme anxiety and inability to tolerate. Pending discussion with rad onc in AM re: utility of ongoing attempts at RT. Pending transfer to NP15 on med onc service.  O/N: increasingly anxious / restless, but able to re-direct and calm down with PRN ativan. Liquid stools, placed rectal tube. Will start banatrol and check CBC in AM, check c dif. This had previously improved by holding bowel regimen.2/16: Acutely increased WOB, HR 130s, SBP 170s --> ENT bedside scope with trach patent, though notable secretions --> suctioned with minimal improvement; placed back on ventilator PSV 10 / +5 with immediate improvement in VS and respiratory distress. Precedex gtt started to assist more frequent suctioning. CXR with LLL consolidation --> resend sputum, and restart Vanco (close trough monitoring with AKI) given known MRSA PNA. Increased diarrhea, cdiff negative, transition to Vital TF. LR 500cc for hemoconcentration of labs, tachycardia. Appreciate radiation oncology; sedated patient with Propofol gtt, bolus x1 with good effect --> able to tolerate 15 minute radiation session; plan to repeat Thursday, Friday, Monday, and Tuesday. 2/17: Psych consulted for depression--> Doxepin added, Klonopin added. Hypernatremia--> D5W and free water flushes added. Ceftaz added for GNR in sputum. Plan for family discussion this PM (see separate note) and radiation this PM. Foley replaced for retention. ON: Tolerated PSV 15/5. Repeat Na 148.2/18: Plan for radiation this afternoon. Rectal tube removed.. Hgb 6.3--> transfused 1un PRBCs. Discussed care with sister and niece at bedside, remain full code and plan to continue radiation course through Tuesday (see family communication note).Review of Allergies/Meds/Hx: I have reviewed the patient's: allergies, past medical history, past surgical history, family history, social history, prior to admission medicationsObjective: Vitals:I have reviewed the patient's current vital signs as documented in the EMRTemp:  [97 ?F (36.1 ?C)-100.04 ?F (37.8 ?C)] 100.04 ?F (37.8 ?C)Pulse:  [65-85] 81Resp:  [0-28] 15BP: (91-136)/(47-65) 112/55SpO2:  [97 %-100 %] 100 %SpO2: 100 %Device (Oxygen Therapy): mechanical ventilatorI/O's:I have reviewed the patient's current I&O's as documented in the EMR.Gross Totals (Last 24 hours) at 10/23/2020 0013Last data filed at 10/22/2020 2300Intake 2983.46 ml Output 1560 ml Net 1423.46 ml Physical ExamConstitutional:  General: Anxious tearful gentleman appearing older than stated age. HENT:    Head: Normocephalic and atraumatic.    Mouth: Mucous membranes are moist.    Extraocular Movements: Extraocular movements intact.    Pupils: Pupils are equal, round, and reactive to light.    Comments: Tracheostomy in placeCardiovascular:    Rate and Rhythm: Regular rhythm. Regular rate   Heart sounds: No murmur heard. Pulmonary:    Breath sounds: No wheezing, rhonchi or rales.    Comments: VentedAbdominal:    General: There is no distension.    Tenderness: There is no abdominal tenderness.    Comments: PEG tube in place with TFs runningGenitourinary:   Comments: Foley in place draining clear, yellow urineMusculoskeletal:    Right lower leg: No edema.    Left lower leg: No edema.    Comments: Moves all extremities equally, to command.Skin:   General: Skin is warm and dry.    Capillary Refill: Capillary refill takes less than 2 seconds. Neurological:    General: No focal deficit present.    Mental Status: He is alert.    Cranial Nerves: No cranial nerve deficit.    Motor: No weakness. Labs: Refer to EMR. I have reviewed the patient's labs within the last 24 hours; see assessment and plan below for significant abnormals.Microbiology:Refer to EMR. I have reviewed all new results within the last 24 hours; see assessment and plan below.Diagnostics:Refer to EMR. I have reviewed all new results within the last 24 hours; see assessment and plan below.Chest EA:VWUJW to EMR. I have reviewed all new results within the last 24 hours; see assessment and plan below.Abdominal JX:BJYNW to EMR. I have reviewed all new results within the last 24 hours; see assessment and plan below.ECG/Tele Events: I have reviewed the patient's ECG and telemetry as resulted in the EMR.Assessment/Plan Neurologic: Analgesia- Tylenol 650 Q6 PRN - Gabapentin 100mg  Q8- Oxycodone 5 / 10mg  Q4 PRN - Dilaudid 0.5mg  Q4 PRN breakthroughAnxiety- Precedex gtt- Klonopin 0.5mg  BID + 0.5 Q12hours prn- Doxepin 10mg  QHS- Appreciate palliative care consult for symptom management, ?SSRI- Psych consulted, appreciate recs?ENT: L laryngeal SCC, invading tracheostomy: s/p trach exchange 10/10/20 and biopsy 10/12/20- Bivona trach to bypass obstruction - Unfortunately, there are no surgical / curative options- Unable to tolerate palliative radiation (2/15), well tolerated subsequently on ventilator with propofol sedation, plan for repeat regimen for subsequent days?Cardiac: Sinus tachycardia, resolved- Multifactorial in the setting of extreme anxiety, WOB and need for standing albuterol nebs; possible hypovolemia in the setting of large stool and UOP, possible PE given DVT but unable to [facility]- may require additional fluid bolus, Precedex as aboveHx of HTN / HLD- Holding home lisinopril given AKI- Holding home lasix given AKI- hold home Crestor, patient refusing?Respiratory:  Respiratory failure- Due to tumor invasion of tracheal stoma; though trach is patent, tumor appears circumferential; also complicated by high secretion burden and frequent mucous plugging- Tolerating PSV 15/5 (VAC when sedated for radiation)- VAP precautions- Palliative radiation as noted- Albuterol Q6- Interventional pulm following?GI: Diarrhea- Change TF to Vital @ 95- Banatrol added- S/p Rectal tube- Cdiff negative?Renal: AKI on CKD (baseline Cr 1.5-1.6), 2/2 supratherapeutic vanco level- Cr peaked 2.63; slowly downtrending- Foley replaced 2/17 for urinary retentionHypovolemic hypernatremia- free water flushes- D5W @50cc /hr- trend Na?Infectious Disease: MRSA PNA- Vanco (2/5 - 2/14), resumed (2/16 - 2/22)- Ceftaz added (2/17-), added for 4+ GNR in sputum on 2/16- f/u sputum (2/16)?Hematology:Acute blood loss anemia- Sanguinous secretions, expected given tumor burden and coagulopathy, may worsen with radiation- Maintain hgb >7?Hx of  chronic ITP, Waldenstrom macroglobulinemia- Transfuse platelets <50 if bleeding, <10 if no active bleeding- Appreciate hematology recommendations?R IJ thrombus - Noted on [place] (2/5, 2/10)- High risk heparin gtt when plt consistently >50, and sanguinous secretions improve--> will hold given bleeding from tumor- Hep SC - Maintain SCDs at all times?Endocrine: Hx T2DM- Lantus 20u daily- Regular insulin 26u Q6- Regular MDISS- HOLD home Metformin, Lantus 64u, Apidra 20u TID w meals- Appreciate diabetes team recsMusculoskeletal: Generalized deconditioning- OOB daily, PT/OT evals- Turn and reposition Q2 hours, PUP dressing?Device Review:- R IJ Port (08/12/20) per guidelines, nonocclusive DVT does not require removal of line- PEG (09/14/20)- Bivona trach (10/10/20)?Code Status / Dispo:- Full code - Per ENT, patient to transfer to medical oncology after SICU- Appreciate palliative care assistance with GOC discussionsNotifications : For questions please call SICU Covering Provider listed in [facility] / EpicSigned:Jenna Woodford, APRN2/18/2022SICU [facility] [employer] Roles (secondary)[facility] 7-1 APP			[phone]

## 2020-10-23 NOTE — Plan of Care
Plan of Care Overview/ Patient Status    0700-1900Patient alert and oriented. Anxious, restless, and tearful at times. Mood is labile he is calm and cooperative at times and becomes extremely upset, writing why are you trying to kill me, emotional support provided and pt eventually calmed down. MAE. PERRLA. C/o hurting all over, pain meds as ordered. Dex continued. SR 70's-80's. SBP 80's-110's/40's70's. Hypotensive with sedation, improved with no intervention. #8 bivona. On CPAP settings 15/5 and 30%, tolerating. Suctioning for thick tan/creamy secretions about Q2 hrs prior to radiation and Q30 min-to 1 hr  after radiation. Coarse lung sounds. Abdomen soft, non-tender, hypoactive BS. Tolerating TF. Held 2 hours prior to sedation for radiation. Foley patent, adequate amounts of clear yellow urine. OOB to chair with ax2. Safety precautions maintained. Patient went to radiation this afternoon. Sedated on propofol, neo briefly on for sedation related hypotension. Tolerated well. Family updated. Received 1 unit of PRBCs and tolerated well.

## 2020-10-23 NOTE — Progress Notes
Inpatient Diabetes Management Team Follow-up Note    Over the past 24 hours, the patient's glucose control has been variable but close to goal     ? Current Nutritional Status:  Vital AF 1.2 @ 95 cc/hr   ? Current Anti-hyperglycemic Regimen:  glargine 20 unit q noon, regular insulin 26 units q 6 hours, regular insulin MID dose sliding scale q 6 hours  ?  Home Anti-hyperglycemic Regimen: Toujeo U300 64 units daily, Apidra 20 units TID with meals, metformin 1000 mg bid   ? Other Contributing Medications: D5@ 6ml/hr    PE:    Temp:  [97 ?F (36.1 ?C)-100.58 ?F (38.1 ?C)] 99.86 ?F (37.7 ?C)  Pulse:  [65-89] 74  Resp:  [0-33] 18  BP: (81-136)/(43-71) 81/46  SpO2:  [95 %-100 %] 100 %  Device (Oxygen Therapy): mechanical ventilator     Wt Readings from Last 3 Encounters:   10/22/20 134 kg   10/06/20 (!) 139.5 kg   09/29/20 (!) 142.4 kg       Data Review:  BGs:      Creatinine (mg/dL)   Date Value   11/91/4782 2.19 (H)     Hemoglobin A1c (%)   Date Value   10/10/2020 9.0 (H)       Assessment:  55 yo h/o type 2 diabetes, HTN, Waldenstroms macroglobulinemia, ETOH, laryngeal SCC s/p trach/PEG presented with SOB significant for tumor invading the trach stoma with partial obstruction.  Diabetes team consulted for assistance in glycemic management.      BG over the past 24 hour have been variable but overall improving.  He remains on tube feeds and he also remains on dextrose infusion for his hypernatremia.  Would recommend to continue the current regimen as he remains on dextrose and once dextrose discontinued suspect the BG to improve.  Discussed plan with the covering provider, Jerrol Banana, APP.  Given patient on continuous tube feedings, target blood glucose between 140mg /dl-180mg /dL.  If bolus regular insulin is administered and TFs are held patient is at risk of developing hypoglycemia. Please check BG q1 hour x6 hours and start IV fluids with D5 if BG < 100 mg/dL    Recommendations:   1. Continue glargine 20 units q noon 2. Continue regular insulin 26 units q 6 hours -- HOLD if tube feeds stopped or discontinued   3. Continue regular insulin mid dose sliding scale q 6 hours   4. Continue blood glucose monitoring q 6 hours   5. Diet: tube feeds per nutrition       Please let us know if you have any questions or concerns about our recommenations. We will continue to follow the patient along with you during his hospitalzation as we attempt to optimize his glycemic control.  Detailed recommendations were communicated to the primary team both verbally and/or through this written note.  I spent a total of 20 minutes with the patient of which 15 minutes (over 50%) was spent in counseling the patient regarding diabetes management and coordinating care with the primary team.     Signed:  Yevonne Aline PA   Inpatient Diabetes Management Team   [facility]: [phone]  Diabetes consult pager # [phone]      NOTE: During nights, weekends, and holidays, please contact the on-call Endocrine Fellow at pager [phone].

## 2020-10-23 NOTE — Plan of Care
Plan of Care Overview/ Patient Status    Pt is alert, trached but able to make needs known by using white board. Very anxious/tearful. RN attempting care and pt writing what is the point if I'm going to die etc. Pt facetimed sister at 0145, very panicked- PRN Klonopin administered at that time. PERRL 3mm. MAE 3/5. Dex continued to keep pt calm. Oxy 10 mg given x1 per CPOT score for pain. HR NSR 70-90s. MAP low 60s when in deep sleep, PA aware. T max 100.6, Tylenol given. #8 Bivona trach in place. Maintained on CPAP 15/5, 30%. Q1-2hr suction of thin tan, malodorous sputum. NPO maintained. TF running at goal. FWF given q6hr for hypernatremia. Rectal tube w/ small amount liquid stool. Foley w/ good UOP, clear yellow. Xeroform and gauze changed on trach site. Butt paste on incontinence derm. Q2hr turn and reposition. See flowsheets for details.0600- pt adamantly refusing turns. Education provided on skin breakdown.

## 2020-10-23 NOTE — Plan of Care
Plan of Care Overview/ Patient Status    7a-7pNursing Progress Note?NEURO: Alert, unable to assess orientation because of trach. Pleasant, cooperative with care, occasionally anxious/tearful. Opens eyes, follows commands. MAE 3/5 strength.?CV: NSR on tele. HR 90's. MAP >65. ?RESP: Trached with #8 XLT. TM 40%/10L. Patient able to self suction secretions prn with yankauer.?GI: PEG intact, diabetisource tube feeds @95 . Rectal tube in place, light brown stool. Q6H BG checks. 	1600- Rectal tube removed.             GU: Foley catheter in place, adequate UOP. 	1600- Foley removed, DTV @2200 . Urinal at bedside.?SKIN: See skin flowsheets for details.?PAIN: Pain managed PO as per MAR.?MOBILITY: Bedrest T/P Q2H. Patient assists with turns/toileting as able.	1300- OOB to chair with PT x2 assist.?IV: PIVx2. Single lumen port to right chest, accessed.?ID: Afebrile. Patient receiving CTX.?Labs: Na+: 140; K+: 3.9; BUN/Cr: 65/2.63; H/H: 7.5/23.5; Plt: 44.Electronically Signed by Marvis Repress, RN, October 18, 2020

## 2020-10-23 NOTE — Progress Notes
Otolaryngology Progress NoteLenwood [name] is a 55 y.o. 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, trach/PEG transferred from [place] due to SOB w/exam significant for tumor invading the trach stoma and scope showing partial obstruction.Interim History- NAEO, AFVSSVitalsTemp:  [36.1 ?C-38.1 ?C] 37.6 ?CPulse:  [65-85] 78Resp:  [0-30] 16BP: (102-136)/(43-65) 115/49SpO2:  [96 %-100 %] 97 %Device (Oxygen Therapy): mechanical ventilatorI/O last 3 completed shifts:In: 4560.1 [I.V.:1350.7; NG/GT:2520; IV Piggyback:689.4]Out: 1940 [Urine:1940]Physical ExamGen: alert, NADEyes: open, normal external appearanceFace: grossly symmetricNose: clear anteriorlyOP/OC: hemostaticNeck: Bivona 8 cuffed secured w/soft trach ties, necrotic but hemostatic mass surrounding the stoma, remaining neck flat, firm tumor Pulm: no increased WOB on TMRecent Labs Lab 02/15/220606 02/15/220606 02/16/220521 WBC 8.8   < > 8.8 HGB 7.7*   < > 8.3* HCT 24.70*   < > 28.20* MCV 89.2   < > 91.9 PLT 60*  --  42*  < > = values in this interval not displayed. Recent Labs Lab 02/17/220543 02/17/220544 02/18/220527 NA 151*   < > 148* K 4.5   < > 4.8 CL 108*   < > 106 CO2 31*   < > 28 BUN 59*   < > 55* CREATININE 2.30*   < > 2.19* GLU 183*   < > 194* - 162* CALCIUM 9.1   < > 8.8 MG 2.3   < > 2.3 PHOS 3.9  --  4.3  < > = values in this interval not displayed. Recent Labs Lab 02/14/220455 02/14/220455 02/15/220606 PTT 25.4   < > 24.2 INR 0.98  --  1.00  < > = values in this interval not displayed. 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 Admitted to SICU for ventilatory support. No acute ENT intervention planned.- no ENT intervention planned at this time; airway widely patent- case discussed at tumor board 2/14; not a surgical candidate - accepted for transfer to NP15 by Medical Oncology service; after transfer, will follow via switch to ENT consult service  - continue routine trach care with careful suctioning- ensure trach well secured with Karn Cassis, [id number]-[id number], Anesthesiology

## 2020-10-23 NOTE — Progress Notes
ON TREATMENT VISIT NOTEDiagnosis:  Larynx cancer55 y.o. man with recent diagnosis of T3 N0 laryngeal cancer with started definitive treatment in outside facility, recently admitted due to disease progression airway management, now continuing with palliative radiotherapy with aggressive intent.Treatment plan: Location: larynx/tracheaCurrent RT dose: 1200 cGyPlanned RT Dose: 2400 cGyFraction today: 3Nursing Assessment: Pt seen on machine/remains ventedPHYSICIAN ASSESSMENT/SUBJECTIVE:  He continues to remain ventilated and sedated for treatments but has been able to tolerate.  Already shows evidence of tumor response.  Per discussion with floor, when not sedated, somewhat anxious and still with some difficulty lying flat.ECOG Performance Status (non-calculated): 3  PE:BP 119/80  - Pulse 69  - Temp 99.5 ?F (37.5 ?C) (Bladder)  - Resp 18  - Ht 6' 2 (1.88 m)  - Wt 134 kg  - SpO2 99%  - BMI 37.93 kg/m? Wt Readings from Last 3 Encounters: 10/22/20 134 kg 10/06/20 (!) 139.5 kg 09/29/20 (!) 142.4 kg PHYSICAL EXAM:Gen:  Sedated, ventilated by trach.HEENT:  Tumor regression noted at tracheal stoma.Resp: normal respirations, speaking full sentencesExtremities: WWP, no edemaData Review:No labs ordered today.I have reviewed the patient's pertinent labs as resulted in the EMR.Lab Results Component Value Date  WBC 7.6 10/23/2020  RBC 2.23 (L) 10/23/2020  RBC 5.2 06/16/2020  HGB 6.3 (LL) 10/23/2020  HCT 20.60 (L) 10/23/2020  HCT 45.5 06/16/2020  MCV 92.4 10/23/2020  MCH 28.3 10/23/2020  MCHC 30.6 (L) 10/23/2020  PLT 57 (L) 10/23/2020  MPV 12.7 (H) 10/23/2020 Lab Results Component Value Date  GLU 221 (H) 10/23/2020  GLU 162 (H) 10/23/2020  BUN 55 (H) 10/23/2020  CREATININE 2.19 (H) 10/23/2020  CO2 28 10/23/2020  CL 106 10/23/2020  NA 148 (H) 10/23/2020  K 4.8 10/23/2020  CALCIUM 8.8 10/23/2020 ASSESSMENT AND PLAN:55 y.o. man with recent diagnosis of T3 N0 laryngeal cancer with started definitive treatment in outside facility, recently admitted due to disease progression airway management, now continuing with palliative radiotherapy with aggressive intent.So far, he showing good clinical response of tumor with tumor regression already noted the stoma.  Unfortunately, due to significant anxiety and some respiratory distress when not ventilated, he has been receiving his treatment ventilated and sedated.  Will continue to plan to proceed with the additional 3 treatments currently planned.  Will need to reassess clinical status at that time whether not there may be a role or need to offer further palliative radiotherapy to the mediastinum.Appreciate care of ICU Team Continue radiation therapy.A review of treatment set-up, dosimetry, and portal images has been performed. *This note was composed using dictation software, and transcription errors may occur in the body of the text above.Connell Bognar Rasar Maple Hudson, MD/PhDAssistant ProfessorRadiation Oncology

## 2020-10-23 NOTE — Other
[facility]ICUPsychiatric Inpatient Consult NoteFeb 18, 2022HISTORY OF THE PRESENT ILLNESS Referred by: Dr.  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 Per nursing staff, patient has intermittently had fits of extreme anxiety, trying to climb out of bed and becoming upset with caregivers. Some evidence per nursing staff that these episodes correlate with respiratory worsening and are less frequent with more respiratory support.On approach: Patient sleeping in bed, listening to music loudly. Awakens to soft voice. Patient with no receptive language deficits. Expresses himself clearly using whiteboard to write. Reports that he is frustrated and feels unsure about what is going on with his medical course. Has concerns that he is being medications just to keep him asleep. Reports that he has been extremely thirsty and wants water-- feels that people are withholding things from him as a means of control. Denies current SI/HI. Does report that he sometimes feels passive death wish. Has never had a plan to harm himself or taken steps to harm himself. On collateral:Spoke with patient's niece [name] [phone]). She reports that patient has history of psychiatric diagnosis but she is unsure what exactly his diagnosis was. Reports that he was previously on medication but she does not know what medications. Patient moved to [place] from [place] about 2 years ago and since then has not been on medication. She reports that he has a history of cutting as a method of emotional release. Also has a history of alcohol abuse and unclear other drug use and has previously had to have his stomach pumped-- niece is unclear if this was due to an intentional overdose of medication/alcohol. Niece reports that to her knowledge he has never attempted suicide. She reports that at baseline he is pleasant and kind but does become reserved and sad at times. She notes that he had become increasingly reserved prior to his diagnosis of SCC and that this worsened after his diagnosis. Notably, patient is married but estranged from his wife who has avoided calls from the family since his diagnosis.REVIEW OF SYSTEMS AND ALLERGIES Review of Systems HENT:      Has dry mouth Respiratory: Positive for shortness of breath.       Intermittent shortness of breath Cardiovascular: Negative for chest pain. Neurological: Negative for weakness and numbness. Allergies:  Allergies as of 10/07/2020 ? (No Known Allergies) OUTPATIENT MEDICATIONS Medications Prior to Admission Medication Sig Dispense Refill Last Dose ? atorvastatin (LIPITOR) 20 mg tablet TAKE 1 TABLET BY MOUTH ONCE DAILY    ? famotidine (PEPCID) 20 mg tablet Take 20 mg by mouth 2 (two) times daily.    ? furosemide (LASIX) 20 mg tablet 1 tablet (20 mg total) by Per G Tube route daily. 30 tablet 1  ? insulin aspart U-100 (NOVOLOG) 100 unit/mL vial Inject under the skin 3 (three) times daily before meals. 8 units before breakfast and lunch, 14 units before dinner    ? insulin glargine,hum.rec.anlog (TOUJEO SOLOSTAR U-300 INSULIN SUBQ) Inject 62 Units under the skin daily.     ? lisinopriL (PRINIVIL,ZESTRIL) 10 mg tablet  TAKE 1 TABLET BY MOUTH ONCE DAILY    ? LORazepam (ATIVAN) 0.5 mg tablet Take 0.5 mg by mouth every 6 (six) hours as needed for anxiety.   Past Month at Unknown time ? metFORMIN (GLUCOPHAGE) 1000 mg tablet Take 1,000 mg by mouth daily.     ? albuterol (PROVENTIL, VENTOLIN) 2.5 mg /3 mL (0.083 %) nebulizer solution Take 3 mLs by nebulization daily.    ? azelastine (ASTELIN) 137 mcg (0.1 %) nasal spray Use 2 sprays in each nostril every 8 (eight) hours as needed for rhinitis. Use in each nostril as directed (Patient not taking: Reported on 10/11/2020)   Not Taking at Unknown time ? magnesium oxide (MAGOX) 400 mg (241.3 mg magnesium) tablet 1 tablet (400 mg total) by Per G Tube route daily. 200 tablet 1 More than a month at Unknown time ? oxyCODONE (ROXICODONE) 5 mg Immediate Release tablet Take 5 mg by mouth every 4 (four) hours as needed.   Unknown at Unknown time ? polyethylene glycol (MIRALAX) 17 gram packet Take 17 g by mouth daily. Mix in 8 ounces of water, juice, soda, coffee or tea prior to taking. (Patient not taking: Reported on 10/11/2020)   Not Taking at Unknown time ? traZODone (DESYREL) 50 mg tablet Take 25 mg by mouth nightly. (Patient not taking: Reported on 10/11/2020)   Not Taking at Unknown time CURRENT MEDICATIONS Standing MedicationsCurrent Facility-Administered Medications Medication Dose Route Frequency Last Rate ? acetaminophen  650 mg Per G Tube Q6H PRN   ? albuterol  2.5 mg Nebulization Q6H WA   ? banana flakes-t-galactooligos.  1 packet Oral TID   ? cefTAZIDime  2 g Intravenous Q12H   ? chlorhexidine gluconate  15 mL Mouth/Throat Q12H   ? dexmedetomidine  0.2-0.7 mcg/kg/hr Intravenous Continuous Stopped (10/22/20 1150) ? dextrose 5 %  50 mL/hr Intravenous Continuous 50 mL/hr (10/22/20 1202) ? dextrose 50% in water (D50W)  12.5 g IV Push Q15 MIN PRN   ? dextrose 50% in water (D50W)  25 g IV Push Q15 MIN PRN   ? gabapentin  100 mg Per NG tube Q8H   ? heparin (porcine)  5,000 Units Subcutaneous Q8H   ? HYDROmorphone  0.5 mg IV Push Q4H PRN   ? insulin glargine  20 Units Subcutaneous Daily   ? insulin regular human  24 Units Subcutaneous Q6H   ? insulin regular human   Subcutaneous Q6H   ? LORazepam  1 mg Per G Tube Q6H PRN   ? oxyCODONE  5 mg Per G Tube Q4H PRN    Or ? oxyCODONE  10 mg Per G Tube Q4H PRN   ? phenylephrine  0.25-9 mcg/kg/min Intravenous Continuous Stopped (10/21/20 1741) ? propofol (DIPRIVAN) 1,000 mg in 100 mL (10 mg/mL)  5-80 mcg/kg/min Intravenous Continuous 5 mcg/kg/min (10/22/20 1200) ? rocuronium       ? traZODone  25 mg Per G Tube Nightly   ? Vancomycin MAR level   Intravenous Once   ? Vancomycin Therapy Placeholder   Intravenous placeholder   ? zinc oxide  1 Application Topical (Top) DAILY PRN   Infusion/IV Medications? dexmedetomidine Stopped (10/22/20 1150) ? dextrose 5 % 50 mL/hr (10/22/20 1202) ? phenylephrine Stopped (10/21/20 1741) ? propofol (DIPRIVAN) 1,000 mg in 100 mL (10 mg/mL) 5 mcg/kg/min (10/22/20 1200) PRN Medicationsacetaminophen, dextrose 50% in water (D50W), dextrose 50% in water (D50W), HYDROmorphone, LORazepam, oxyCODONE **OR** oxyCODONE, zinc oxideHEALTH ISSUES/MEDICAL HISTORY Past Medical History: Diagnosis Date ? Diabetes mellitus (HC Code) (HC  CODE)  ? Hypercholesteremia  ? Hypertension     No past surgical history on file.  PAST PSYCHIATRIC HISTORY Psychiatric Treatment History No data recordedSOCIAL & SUBSTANCE ABUSE HISTORY Social History Socioeconomic History ? Marital status: Legally Separated   Spouse name: Not on file ? Number of children: Not on file ? Years of education: Not on file ? Highest education level: Not on file Tobacco Use ? Smoking status: Former Smoker   Packs/day: 1.00   Years: 30.00   Pack years: 30.00   Quit date: 03/04/2013   Years since quitting: 7.6 Substance and Sexual Activity ? Alcohol use: Yes   Alcohol/week: 14.0 standard drinks   Types: 14 Cans of beer per week Social Determinants of Health Financial Resource Strain:  ? Difficulty of Paying Living Expenses:  Food Insecurity:  ? Worried About Programme researcher, broadcasting/film/video in the Last Year:  ? Barista in the Last Year:  Transportation Needs:  ? Freight forwarder (Medical):  ? Lack of Transportation (Non-Medical):  Physical Activity:  ? Days of Exercise per Week:  ? Minutes of Exercise per Session:  Stress:  ? Feeling of Stress :  Social Connections:  ? Frequency of Communication with Friends and Family:  ? Frequency of Social Gatherings with Friends and Family:  ? Attends Religious Services:  ? Active Member of Clubs or Organizations:  ? Attends Banker Meetings:  ? Marital Status:  Intimate Partner Violence:  ? Fear of Current or Ex-Partner:  ? Emotionally Abused:  ? Physically Abused:  ? Sexually Abused:  (If you would like to add more detailed Developmental and Educational information, you can utilizethe Smartphrase PSYSOCHXADULT or PSYSOCHXPED within the History/Social Note section of thepatients chart to save it at the patient level, or add it directly here within this evaluation)FAMILY HISTORY Family History Problem Relation Age of Onset ? Aneurysm Mother  MENTAL STATUS EXAM Temp: 97.1 ?F (36.2 ?C)Pulse: 78Resp: (!) 23BP: (!) 116/53SpO2: 99 %Psychiatric ExaminationAttitude: guarded Becomes more open with extended conversationSpeech: Interview conducted with patient communicating via text on white board d/t trachMood: Patient reported mood: crappyAffect: Affective Quality was dysphoric.Affective Range: constricted Intermittently tearfulThought Process:  Goal-directed Associations: normal Thought Content: no delusions or hallucinations Suicidal Ideation: no current suicidal ideation, plan or intent Violent Ideation: no current violent/homicidal ideation, intent or plan Cognitive EvaluationSensorium: alert Orientation: oriented to person, place and date Attention and Concentration:  Normal attention and concentration  SAFETY AND RISK ASSESSMENT PSY RISK ASSESSMENT SAFE-T WITH C-SSRS C-SSRS: Suicidal Ideation:  Past Month  WISH TO BE DEAD:  Yes  SUICIDAL THOUGHTS:  No  SUICIDAL THOUGHTS WITH METHOD:  No  SUICIDAL INTENT WITHOUT SPECIFIC PLAN:  No  INTENT WITH PLAN:  No  Have you ever done anything, started to do anything or prepared to do anything to end your life?:  NoLABORATORY/DIAGNOSTIC RESULTS Recent Labs Lab 02/14/220455 02/15/220606 02/16/220521 WBC 8.6 8.8 8.8 HGB 7.8* 7.7* 8.3* HCT 25.80* 24.70* 28.20* MCV 90.8 89.2 91.9 PLT 52* 60* 42* NEUTROPHILS 81.1* 78.2* 83.1* Recent Labs Lab 02/15/220606 02/15/220610 02/16/220521 02/16/220523 02/17/220543 02/17/220544 02/17/221125 NA 146*  --  147*  --  151*  --   --  K 4.5   < > 4.3   < > 4.5  --   --  CL 105   < > 104   < > 108*  --   --  CO2 31*   < > 33*   < > 31*  --   --  BUN 60*   < > 60*   < > 59*  --   --  CREATININE 2.29*   < > 2.26*   < > 2.30*  --   --  GLU 147*   < > 275*   < > 183*   < > 209* ANIONGAP 10   < > 10   < > 12  --   --  CALCIUM 9.8   < > 10.1   < > 9.1  --   --  MG 2.5*   < > 2.3   < > 2.3  --   --  PHOS 3.9   < > 3.7   < > 3.9  --   --   < > = values in this interval not displayed. 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 Patient expressing frustration with his situation and extreme thirst (in the context of worsening hypernatremia). Would recommend taking time for longer conversations regarding reasoning behind plan of care for patient as well as increased free water repletion to maintain eunatremia. Per nursing report, patient also with possible panic disorder based on nursing report of episodes of extreme agitation. Will recommend adjusting benzodiazepines to have standing clonazepam with prn clonazepam rather than PRN lorazepam. Would also recommend transitioning from low dose trazodone to doxepin for sleep.Psychiatric Diagnosis:Panic disorderPsychological factors affecting medical careActive Hospital Problems  Diagnosis ? Principal Problem/Diagnosis:  Laryngeal cancer (HC Code) (HC CODE) [C32.9] ? Acute hypoxemic respiratory failure (HC Code) (HC CODE) [J96.01] ? Difficult airway [T88.4XXA] ? 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.3]  PLAN - No need for 1:1- Would recommend taking extra time to explain rationale of medications and procedures to patient- Discontinue trazodone- Start doxepin 10 mg nightly- Discontinue lorazepam- Start clonazepam 0.5 mg q12h w/ additional 0.5 mg q12h prn for anxiety- Address hypernatremia as it is likely contributing to patient's discomfort and he cannot replace free water losses by drinking fluids to thirst-- goal should be eunatremiaDoes the patient require an inpatient psychiatric bed? NoIs the patient medically cleared? NoElectronically SignedProvider:  Geoffery Spruce, MDPGY-4 Neurology ResidentFebruary 17, 202212:41 PMATTENDINGI have examined the pt on 2/17, reviewed the medical record, discussed the case with the resident, and verified the above. The pt was sleepy and could not be interviewed in detail.The sister, Victorino Dike, indicated that he initially was active at the rehab, playing guitar for the other pts, etc, but in the last few weeks was mostly in bed, asking for help.A+P1. Agree with doxepin for sleep2. Agree with clonazepam 0.5 twice per day for apparent panic statesWill followP Jensyn Shave MD

## 2020-10-23 NOTE — Other
Family GOC DiscussionIn brief, Mr. Arthur [name] is a 54yoM admitted to the SICU on 2/9 in the setting of hypoxia due to a new circumferential necrotic mass around his trach site due to laryngeal SCC. Mr. Arthur [name] has intermittently expressed frustrations with his hospitalization and uncertainty about completing radiation.Together with the palliative care team Debbrah Alar, APRN), I met with Arthur [name] sister Arthur [name]) and niece Arthur [name]) at his bedside.  We discussed Arthur [name] hospital course until this point and their questions were answered. Looking forward, Arthur [name] expressed that he would like to finish his current radiation regimen (through next Tuesday) to see if it is efficacious. His family agreed with the plan. We also addressed his code status. The patient stated that he would like to remain a full code at this time with the understanding that CPR may lead to physical and neurological deficits of varying degrees. Plan:- Remain Full Code- Plan for completion of this round of radiation therapy through Tuesday, 2/22- Readdress GOC as needed and with further information about effectiveness of radiation therapyJenna Annaelle [name], APRNSICU APP2/18/2022

## 2020-10-23 NOTE — Other
VANCOMYCIN LEVEL EVALUATIONCurrent Vancomycin Order: 2 g IV per laboratory/placeholder data. Day of Therapy: 2Vancomycin Indication: Suspected MRSA infectionEstimated Creatinine Clearance: 42 mL/min (A) (by C-G formula based on SCr of 2.3 mg/dL (H)).Creatinine (mg/dL) Date Value 14/78/2956 2.30 (H) 10/21/2020 2.26 (H) 10/20/2020 2.29 (H)  Renal Function: AKILevel: Lab Results Last 72 Hours Component Value Date/Time  Vancomycin Random 13.4 10/22/2020 08:59 PM  Vancomycin Random 17.9 (H) 10/22/2020 09:19 AM Type of Level: Random (36 hours post-dose)Based on vancomycin level obtained, recommend:   Redose 2 g x 1 nowRepeat Level:  2/19 @ 0600ID/AST Consulted? No [facility] Vancomycin Dosing GuidelineBH Vancomycin Dosing GuidelineFor questions, please contact the pharmacist: Sharene Butters, PharmD    Phone/Mobile Heartbeat: [facility]

## 2020-10-23 NOTE — Plan of Care
Inpatient Physical Therapy Progress Note IP Adult PT Eval/Treat - 10/23/20 0815    Date of Visit / Treatment  Date of Visit / Treatment 10/23/20   End Time 0815    General Information  Subjective trach   General Observations Supine in bed, NAD, RN cleared for activity, on monitor, trach to vent, rectal tube, Arthur [name], feed   Precautions/Limitations fall precautions;bed alarm;chair alarm    Vital Signs and Orthostatic Vital Signs  Vital Signs Vital Signs Stable    Pain/Comfort  Pain Comment (Pre/Post Treatment Pain) no c/o pain    Patient Coping  Observed Emotional State accepting    Cognition  Level of Consciousness alert   Cognition Comments follows commands, emotionally labile, appears confused at times, perseverative    Skin Assessment  Skin Assessment See Nursing Documentation    Balance  Sitting Balance: Static  FAIR-      Contact Guard to maintain static position with no Assistive Device   Sitting Balance: Dynamic  FAIR-     Performs dynamic activities through partial range (50-75%) with Contact Guard   Standing Balance: Static POOR      Moderate assist to maintain static position with no Assistive Device   Standing Balance: Dynamic  POOR     Moves through 1/4 to 1/2 ROM range with moderate assist to right self   Balance Assist Device Hand held assist    Bed Mobility  Supine-to-Sit Independence/Assistance Level Minimum assist;Assist of 1 person + 1 person to manage equipment   Supine-to-Sit Assist Device Bed rails;Hand held assist;Head of bed elevated    Sit-Stand Transfer Training  Sit-to-Stand Transfer Independence/Assistance Level Minimum assist;Assist of 2   Sit-to-Stand Transfer Assist Device Hand held assist   Stand-to-Sit Transfer Independence/Assistance Level Minimum assist;Assist of 2   Stand-to-Sit Transfer Assist Device Hand held assist    Bed-Chair Transfer Training  Bed-to-Chair Transfer Independence/Assistance Level Minimum assist;Assist of 2   Bed-to-Chair Transfer Assist Device Hand held assist   Bed-Chair Transfer Comments able to take steps and achieve upright posture    Handoff Documentation  Handoff Patient in chair;Chair alarm;Pressure relief cushion    Endurance  Endurance Comments fair-    PT- AM-PAC - Basic Mobility Screen- How much help from another person do you currently need.....  Turning from your back to your side while in a a flat bed without using rails? 3 - A Little - Requires a little help (supervision, minimal assistance). Can use assistive devices.   Moving from lying on your back to sitting on the side of a flat bed without using bed rails? 3 - A Little - Requires a little help (supervision, minimal assistance). Can use assistive devices.   Moving to and from a bed to a chair (including a wheelchair)? 2 - A Lot - Requires a lot of help (maximum to moderate assistance). Can use assistive devices.   Standing up from a chair using your arms(e.g., wheelchair or bedside chair)? 2 - A Lot - Requires a lot of help (maximum to moderate assistance). Can use assistive devices.   To walk in a hospital room? 1 - Total - Requires total assistance or cannot do it at all.   Climbing 3-5 steps with a railing? 1 - Total - Requires total assistance or cannot do it at all.   AMPAC Mobility Score 12   TARGET Highest Level of Mobility Mobility Level 4, Transfer to chair    Therapeutic Exercise  Therapeutic Exercise Comments Pt performed bed level AROM x  4 extremities    Therapeutic Functional Activity  Therapeutic Functional Activity Comments EOB sitting, seated weight shift, static standing, transfer    Clinical Impression  Follow up Assessment Pt is able to tolerate bed to chair transfer with Min a x 2 HHA. He demonstrates impaired balance and activity tolerance although physiologically responded well to transfer. He will continue to benefit from PT to optimize mobility. Working towards ambulation when appropriate    Patient/Family Stated Goals  Patient/Family Stated Goal(s) return home;return to prior level of functioning    Frequency/Equipment Recommendations  PT Frequency 3x per week   What day of week is next treatment expected? Monday    PT Recommendations for Inpatient Admission  Activity/Level of Assist out of bed;transfers only;assist of 2   Positioning chair position of the bed   Therapeutic Exercise ROM as tolerated    PT Discharge Summary  Physical Therapy Disposition Recommendation Rehab at [facility] PT, DPT [phone]

## 2020-10-24 LAB — CBC WITH AUTO DIFFERENTIAL
BKR WAM ABSOLUTE IMMATURE GRANULOCYTES.: 0.09 x 1000/ÂµL (ref 0.00–0.30)
BKR WAM ABSOLUTE LYMPHOCYTE COUNT.: 0.33 x 1000/ÂµL — ABNORMAL LOW (ref 0.60–3.70)
BKR WAM ABSOLUTE NRBC (2 DEC): 0 x 1000/ÂµL (ref 0.00–1.00)
BKR WAM ANALYZER ANC: 6.51 x 1000/ÂµL (ref 2.00–7.60)
BKR WAM BASOPHIL ABSOLUTE COUNT.: 0.01 x 1000/ÂµL (ref 0.00–1.00)
BKR WAM BASOPHILS: 0.1 % (ref 0.0–1.4)
BKR WAM EOSINOPHIL ABSOLUTE COUNT.: 0.33 x 1000/ÂµL (ref 0.00–1.00)
BKR WAM EOSINOPHILS: 4.2 % (ref 0.0–5.0)
BKR WAM HEMATOCRIT (2 DEC): 22.3 % — ABNORMAL LOW (ref 38.50–50.00)
BKR WAM HEMOGLOBIN: 7 g/dL — ABNORMAL LOW (ref 13.2–17.1)
BKR WAM IMMATURE GRANULOCYTES: 1.1 % — ABNORMAL HIGH (ref 0.0–1.0)
BKR WAM LYMPHOCYTES: 4.2 % — ABNORMAL LOW (ref 17.0–50.0)
BKR WAM MCH (PG): 28.3 pg (ref 27.0–33.0)
BKR WAM MCHC: 31.4 g/dL (ref 31.0–36.0)
BKR WAM MCV: 90.3 fL (ref 80.0–100.0)
BKR WAM MONOCYTE ABSOLUTE COUNT.: 0.66 x 1000/ÂµL (ref 0.00–1.00)
BKR WAM MONOCYTES: 8.3 % (ref 4.0–12.0)
BKR WAM MPV: 12.4 fL — ABNORMAL HIGH (ref 8.0–12.0)
BKR WAM NEUTROPHILS: 82.1 % — ABNORMAL HIGH (ref 39.0–72.0)
BKR WAM NUCLEATED RED BLOOD CELLS: 0 % (ref 0.0–1.0)
BKR WAM PLATELETS: 79 x1000/ÂµL — ABNORMAL LOW (ref 150–420)
BKR WAM RDW-CV: 16.7 % — ABNORMAL HIGH (ref 11.0–15.0)
BKR WAM RED BLOOD CELL COUNT.: 2.47 M/ÂµL — ABNORMAL LOW (ref 4.00–6.00)
BKR WAM WHITE BLOOD CELL COUNT: 7.9 x1000/ÂµL (ref 4.0–11.0)

## 2020-10-24 LAB — PHOSPHORUS     (BH GH L LMW YH): BKR PHOSPHORUS: 4.1 mg/dL (ref 2.2–4.5)

## 2020-10-24 LAB — BASIC METABOLIC PANEL
BKR ANION GAP: 13 (ref 7–17)
BKR BLOOD UREA NITROGEN: 57 mg/dL — ABNORMAL HIGH (ref 6–20)
BKR BUN / CREAT RATIO: 24.9 — ABNORMAL HIGH (ref 8.0–23.0)
BKR CALCIUM: 9 mg/dL (ref 8.8–10.2)
BKR CHLORIDE: 104 mmol/L (ref 98–107)
BKR CO2: 27 mmol/L (ref 20–30)
BKR CREATININE: 2.29 mg/dL — ABNORMAL HIGH (ref 0.40–1.30)
BKR EGFR (AFR AMER): 36 mL/min/{1.73_m2} (ref >60)
BKR EGFR (NON AFRICAN AMERICAN): 30 mL/min/{1.73_m2} (ref >60)
BKR GLUCOSE: 199 mg/dL — ABNORMAL HIGH (ref 70–100)
BKR POTASSIUM: 4.4 mmol/L (ref 3.3–5.3)
BKR SODIUM: 144 mmol/L (ref 136–144)

## 2020-10-24 LAB — LOWER RESP CULTURE, QUAL: BKR LOWER RESPIRATORY CULTURE (QUAL): NORMAL

## 2020-10-24 LAB — VANCOMYCIN, RANDOM     (BH GH LMW YH): BKR VANCOMYCIN RANDOM: 20.4 ug/mL — ABNORMAL HIGH

## 2020-10-24 LAB — MAGNESIUM: BKR MAGNESIUM: 2.2 mg/dL (ref 1.7–2.4)

## 2020-10-24 LAB — IMMATURE PLATELET FRACTION (BH GH LMW YH)
BKR WAM IPF, ABSOLUTE: 4.5 x1000/ÂµL (ref <20.0)
BKR WAM IPF: 5.7 % (ref 1.2–8.6)

## 2020-10-24 MED ORDER — VANCOMYCIN MAR LEVEL
Freq: Once | INTRAVENOUS | Status: CP
Start: 2020-10-24 — End: ?

## 2020-10-24 MED ORDER — OXYCODONE (ROXICODONE) IMMEDIATE RELEASE 2.5 MG HALFTAB
2.5 mg | GASTROSTOMY | Status: DC | PRN
Start: 2020-10-24 — End: 2020-10-29
  Administered 2020-10-26: 05:00:00 2.5 mg via GASTROSTOMY

## 2020-10-24 MED ORDER — ACETAMINOPHEN 650 MG/20.3 ML ORAL SUSPENSION
65020.3 mg/20.3 mL | Freq: Four times a day (QID) | GASTROSTOMY | Status: DC | PRN
Start: 2020-10-24 — End: 2020-10-25

## 2020-10-24 MED ORDER — CIPROFLOXACIN 750 MG TABLET
750 mg | Freq: Two times a day (BID) | GASTROSTOMY | Status: DC
Start: 2020-10-24 — End: 2020-10-28
  Administered 2020-10-25 – 2020-10-28 (×7): 750 mg via GASTROSTOMY

## 2020-10-24 MED ORDER — ACETAMINOPHEN 650 MG/20.3 ML ORAL SUSPENSION
65020.3 mg/20.3 mL | Freq: Four times a day (QID) | GASTROSTOMY | Status: DC | PRN
Start: 2020-10-24 — End: 2020-11-20
  Administered 2020-10-25 – 2020-11-20 (×28): 650 mL via GASTROSTOMY

## 2020-10-24 MED ORDER — CIPROFLOXACIN 500 MG TABLET
500 mg | Freq: Two times a day (BID) | GASTROSTOMY | Status: DC
Start: 2020-10-24 — End: 2020-10-24

## 2020-10-24 MED ORDER — OXYCODONE IMMEDIATE RELEASE 5 MG TABLET
5 mg | GASTROSTOMY | Status: DC | PRN
Start: 2020-10-24 — End: 2020-10-29
  Administered 2020-10-25 – 2020-10-29 (×9): 5 mg via GASTROSTOMY

## 2020-10-24 MED ORDER — CIPROFLOXACIN 750 MG TABLET
750 mg | Freq: Two times a day (BID) | GASTROSTOMY | Status: DC
Start: 2020-10-24 — End: 2020-10-24

## 2020-10-24 MED ORDER — CLONIDINE HCL 0.1 MG TABLET
0.1 mg | Freq: Two times a day (BID) | GASTROSTOMY | Status: DC
Start: 2020-10-24 — End: 2020-10-29
  Administered 2020-10-29: 05:00:00 0.1 mg via GASTROSTOMY

## 2020-10-24 MED ORDER — CLONIDINE HCL 0.1 MG TABLET
0.1 mg | Freq: Three times a day (TID) | GASTROSTOMY | Status: CP
Start: 2020-10-24 — End: ?
  Administered 2020-10-26 – 2020-10-28 (×6): 0.1 mg via GASTROSTOMY

## 2020-10-24 MED ORDER — BANANA FLAKES-TRANSGALACTOOLIGOSACCHARIDE ORAL POWDER PACKET
Freq: Three times a day (TID) | GASTROSTOMY | Status: DC
Start: 2020-10-24 — End: 2020-10-27
  Administered 2020-10-25 – 2020-10-27 (×7): via GASTROSTOMY

## 2020-10-24 MED ORDER — ASCORBIC ACID (VITAMIN C) 250 MG TABLET
250 mg | Freq: Every day | GASTROSTOMY | Status: DC
Start: 2020-10-24 — End: 2020-11-17
  Administered 2020-10-24 – 2020-11-17 (×25): 250 mg via GASTROSTOMY

## 2020-10-24 MED ORDER — CLONIDINE HCL 0.1 MG TABLET
0.1 mg | Freq: Four times a day (QID) | GASTROSTOMY | Status: CP
Start: 2020-10-24 — End: ?
  Administered 2020-10-24 – 2020-10-25 (×5): 0.1 mg via GASTROSTOMY

## 2020-10-24 MED ORDER — THIAMINE HCL (VITAMIN B1) 50 MG TABLET
50 mg | Freq: Every day | GASTROSTOMY | Status: DC
Start: 2020-10-24 — End: 2020-11-17
  Administered 2020-10-24 – 2020-11-17 (×25): 50 mg via GASTROSTOMY

## 2020-10-24 MED ORDER — FOLIC ACID 1 MG TABLET
1 mg | Freq: Every day | GASTROSTOMY | Status: DC
Start: 2020-10-24 — End: 2020-11-17
  Administered 2020-10-24 – 2020-11-17 (×25): 1 mg via GASTROSTOMY

## 2020-10-24 MED ORDER — CLONIDINE HCL 0.1 MG TABLET
0.1 mg | GASTROSTOMY | Status: DC
Start: 2020-10-24 — End: 2020-10-29

## 2020-10-24 NOTE — Progress Notes
[place] [facility]	SICU Progress NoteAttending Provider: Alric Quan, MDPost-Operative Day/Post Injury Day Procedure(s):2/5: [name] exchange (ENT) Hospital LOS: 15 days Interim History:  HPI: 55 y.o. male admitted to the SICU on 2/9 for ventilator management following an RRT on the oncology floor for desaturation and increased work of breathing while on trach mask and ultimately placed back on vent. ?Patient was initially a transfer from [facility] on 2/4 after trach and PEG when he had a partial obstruction of trach site from a new necrotic mass. He underwent trach exchange 2/5. Was on trach mask on oncology floor however had 2 episodes on floor requiring RRT for lavage and likely mucous plugging. ?Course Complicated by: - Incidental R IJ thrombus - Multiple episodes of presumed mucous plugging requiring RRT's ?PMH: Chronic ITP, Waldenstrom macroglobulinemia, HTN. HLD, DM, tobacco and alcohol use, Laryngeal SCC (weekly Cisplatin and RT)?PSH: None known ?Home meds: Lipitor, Pepcid, Lisinopril, Metformin, Oxycodone, miralax, Insulin U100 and Lantus ?SICU course:2/9: Arrives to SICU HDS, on vent PCV. Appears well. Transitioned to Pressure support on vent and tolerating well. O/N: Episode of patient feeling like not getting a full breath, extremely tachypenic and increased work of breathing and tachycardia, attempted suctioning and lavage without improvement, attempted to switch to PCV and VAC but due to dyssynchrony unable to tolerate, no desat but poor ventilation and TV and ETCO2 up to 100. ENT at bedside do not feel additional scope would benefit as he was just scoped a few hours ago, ultimately gave fentanyl push and started propofol for vent synchrony but despite max prop still dyssynchronous. Gave one dose of roccuronium once sedated for vent synchrony with good effect. Albuterol and HTS neb given and peaks improved from 50 to 30's. No further paralytic needed and now synchronous on VAC. Kept on propofol for vent synchrony. CXR reassuring and back to GCS 11T. Hypotension only following sedation and Rocc, Low dose Neo started. 2/10: Kept on higher sedation in AM for [place] A/P (for staging) and soft tissue neck. Weaning sedation and will plan for PSV trial once more awake. Scoped by ENT in AM, airway patent. Q1-2hr suction requirements of thick tan secretions. No further episodes of respiratory distress although sedation remains high. Tube feeds changed to continuous given significant hyperglycemia. Insulin regimen changed per diabetes team recommendations. Pressure ulceration noted around tracheostomy --> ENT at bedside to evaluation, likely some component of invasive tumor growth. Palliative care consult for assist with symptom management and family moving forward. Primary oncologist office made aware of referral. O/N: weaning down on Prop, no episodes of respiratory distress or desats overnight. Febrile to 101.3, sputum and blood cultures ordered. 500cc LR bolus given for some oliguria and rising Cr. 2/11: Given 500cc NS for oliguria with good response. Vanco level yesterday 25, likely cause of worsening aki, on hold. UOP & lytes remains stable. Insulin adjusted per diabetes team. PSV as tolerated (doing well). CPT added. Confirmed with ENT pulm recs for blue portex suctionaid trach- no plans to switch at this time. Bowel regimen added. Plan keep on neo if low dose but if inc, consider changing to levo as likely then not in setting of sedation. Family updated on phone. Confirmed w heme- no AC rec for R IJ thrombus until plt > 50. Was going to attempt to place pt on TM overnight as he did very well on PSV x 3hrs, however, pt had anxiety attack regarding his cancer and prognosis, he requested to sleep during night and was agreeable to TM in  am. O/N: Patient agrees to PSV overnight which he did very well on. Agreeable to getting OOB to chair and possibly trach mask in AM but wants to stay on vent tonight. Off Neo by AM.  2/12: trial trach mask as tolerated this AM. Adjusted insulin regimen per the diabetes management service. Sputum now with staph, awaiting sensitivities. More loose stools today; nursing placed rectal tube and held bowel regimen.  ON: Anxious at times, feeling like he cannot breath but doing well on TM. Frequent suctioning. Increased standing. FIO2 increased to 40%.  insulin to 14U per endo reccs. 2/13: continue to adjust insulin regimen, now 20u regular q6. Cr continues to uptrend 2.57 --> 2.63 and repeat vanco level still elevated 24.6; pharmacy to continue dosing adjustments. Otherwise, remains on trach mask. Sputum with MRSA --> d/c ceftriaxone. ON: increased standing insulin to 24U. 2/14: Will discuss [facility] plan with hematology now that PLT > 50 x2 checks. Will also d/c vancomycin today (10d of therapy thus far). AKI appears to have peaked at 2.57 -> 2.63 -> 2.47. Awaiting transfer to the oncology service on NP 15. Okay to start [facility], but will hold off today given more bloody secretions. 2/15: Plan to start palliative RT today. AM session aborted due to extreme anxiety (despite premedication with IV ativan), tried again in the PM with 10mg  IV Valium + 3mg  IV ativan, procedure again aborted due to extreme anxiety and inability to tolerate. Pending discussion with rad onc in AM re: utility of ongoing attempts at RT. Pending transfer to NP15 on med onc service.  O/N: increasingly anxious / restless, but able to re-direct and calm down with PRN ativan. Liquid stools, placed rectal tube. Will start banatrol and check CBC in AM, check c dif. This had previously improved by holding bowel regimen.2/16: Acutely increased WOB, HR 130s, SBP 170s --> ENT bedside scope with trach patent, though notable secretions --> suctioned with minimal improvement; placed back on ventilator PSV 10 / +5 with immediate improvement in VS and respiratory distress. Precedex gtt started to assist more frequent suctioning. CXR with LLL consolidation --> resend sputum, and restart Vanco (close trough monitoring with AKI) given known MRSA PNA. Increased diarrhea, cdiff negative, transition to Vital TF. LR 500cc for hemoconcentration of labs, tachycardia. Appreciate radiation oncology; sedated patient with Propofol gtt, bolus x1 with good effect --> able to tolerate 15 minute radiation session; plan to repeat Thursday, Friday, Monday, and Tuesday. 2/17: Psych consulted for depression--> Doxepin added, Klonopin added. Hypernatremia--> D5W and free water flushes added. Ceftaz added for GNR in sputum. Plan for family discussion this PM (see separate note) and radiation this PM. Foley replaced for retention. ON: Tolerated PSV 15/5. Repeat Na 148.2/18: Plan for radiation this afternoon. Rectal tube removed.. Hgb 6.3--> transfused 1un PRBCs. Discussed care with sister and niece at bedside, remain full code and plan to continue radiation course through Tuesday (see family communication note).2/19: PSV weaned 15 --> 12/+5. Start Clonidine and discontinue Precedex gtt. Discontinue Gabapentin and Dilaudid IV, decrease Oxycodone to 2.5 / 5. Na improved 144, discontinue D5W; sugars should improve, maintain current regimen per DM team. GNR per micro lab not pseudomonas, klebsiella, or other enteric organism; would have speciated --> change Ceftaz to PO Cipro, to complete course 2/23 for one week. Cr again increased with Vanco level over 20 --> next dose held, monitor level closely. Hgb remains low despite 1U yesterday, 6.3 --> 7.0; start multivitamins and check with Heme. Review of Allergies/Meds/Hx: I have reviewed the  patient's: allergies, past medical history, past surgical history, family history, social history, prior to admission medicationsObjective: Vitals:I have reviewed the patient's current vital signs as documented in the EMRTemp:  [98.78 ?F (37.1 ?C)-100.76 ?F (38.2 ?C)] 100.4 ?F (38 ?C)Pulse:  [67-89] 77Resp:  [1-33] 18BP: (81-143)/(46-80) 99/47SpO2:  [95 %-100 %] 100 %SpO2: 100 %Device (Oxygen Therapy): mechanical ventilatorI/O's:I have reviewed the patient's current I&O's as documented in the EMR.Gross Totals (Last 24 hours) at 10/24/2020 0556Last data filed at 10/24/2020 0500Intake 4561.39 ml Output 1652 ml Net 2909.39 ml Physical ExamConstitutional:     General: Anxious gentleman appearing older than stated age. HENT:    Head: Normocephalic and atraumatic.    Mouth: Mucous membranes are moist.    Extraocular Movements: Extraocular movements intact.    Pupils: Pupils are equal, round, and reactive to light.    Comments: Tracheostomy in placeCardiovascular:    Rate and Rhythm: Regular rhythm. Regular rate   Heart sounds: No murmur heard. Pulmonary:    Breath sounds: No wheezing, rhonchi or rales.    Comments: Ventilated via trachAbdominal:    General: There is no distension.    Tenderness: There is no abdominal tenderness.    Comments: PEG tube in place with TFs runningGenitourinary:   Comments: Foley in place draining clear, yellow urineMusculoskeletal:    Lower legs: Mild edema   Comments: Moves all extremities equally, to command.Skin:   General: Skin is warm and dry.    Capillary Refill: Capillary refill takes less than 2 seconds. Neurological:    General: No focal deficit present.    Mental Status: He is alert.    Cranial Nerves: No cranial nerve deficit.    Motor: No weakness. Labs: Refer to EMR. I have reviewed the patient's labs within the last 24 hours; see assessment and plan below for significant abnormals.Microbiology:Refer to EMR. I have reviewed all new results within the last 24 hours; see assessment and plan below.Diagnostics:Refer to EMR. I have reviewed all new results within the last 24 hours; see assessment and plan below.Chest ZO:XWRUE to EMR. I have reviewed all new results within the last 24 hours; see assessment and plan below.Abdominal AV:WUJWJ to EMR. I have reviewed all new results within the last 24 hours; see assessment and plan below.ECG/Tele Events: I have reviewed the patient's ECG and telemetry as resulted in the EMR.Assessment/Plan Neurologic: Analgesia- Tylenol 650 Q6 PRN - Oxycodone decrease 2.5 / 5mg  Q4 PRN - Gabapentin 100mg  Q8 --> discontinue- Dilaudid 0.5mg  Q4 PRN breakthrough --> discontinueAnxiety- Precedex gtt --> discontinue, transition to Clonidine taper- Klonopin 0.5mg  BID + 0.5 Q12hours prn- Doxepin 10mg  QHS- Appreciate palliative care and psych consults?ENT: L laryngeal SCC, invading tracheostomy: s/p trach exchange 10/10/20 and biopsy 10/12/20- Bivona trach to bypass obstruction - Unfortunately, there are no surgical / curative options- Palliative radiation x5 days (2/16, 2/17, 2/18, 2/21, 2/22); requires VAC with full propofol sedation?Cardiac: Hx of HTN / HLD- Hold home lisinopril, lasix, crestor given AKI?Respiratory:  Respiratory failure- Due to tumor invasion of tracheal stoma; though trach is patent, tumor appears circumferential; also complicated by high secretion burden and frequent mucous plugging- Palliative radiation as noted- Tolerating PSV, decrease to 12/+5 (VAC when sedated for radiation)- Albuterol Q6?GI: Diarrhea; resolving- Change TF to Vital @ 95- Banatrol TID?Renal: AKI on CKD (baseline Cr 1.5-1.6), 2/2 supratherapeutic vanco level- Cr peaked 2.63; slowly downtrending --> uptrending again with vanco level ~20, similar to last event; HOLD next dose- Foley replaced 2/17 for urinary retentionHypovolemic hypernatremia- Free water flushes 250cc Q8- D5W @  50cc/hr --> discontinue?Infectious Disease: MRSA PNA- Vanco (2/5 - 2/14), resumed (2/16 - 2/22) * LEVEL CANNOT GO ABOVE 204+ GNR PNA- Per Micro, cannot speciate, but confirmed no enteric organism, pseudomonas, klebsiella, ecoli- Ceftaz (2/17- 2/19), change to Cipro (2/19 - 2/23)?Hematology:Critical illness anemia- Sanguinous secretions, expected given tumor burden and coagulopathy, may worsen with radiation- Start multivitamin- Maintain Hgb >7?Hx of chronic ITP, Waldenstrom macroglobulinemia- Transfuse platelets <50 if bleeding, <10 if no active bleeding- Appreciate hematology recommendations?R IJ thrombus - High risk heparin gtt when plt consistently >50- Hep SC - Maintain SCDs at all times?Endocrine: Hx T2DM- Lantus 20u daily- Regular insulin 26u Q6- Regular MDISS- HOLD home Metformin, Lantus 64u, Apidra 20u TID w meals- Appreciate diabetes team recsMusculoskeletal: Generalized deconditioning- OOB daily, PT/OT evals- Turn and reposition Q2 hours, PUP dressing?Device Review:- R IJ Port (08/12/20) per guidelines, nonocclusive DVT does not require removal of line- PEG (09/14/20)- Bivona trach (10/10/20)?Code Status / Dispo:- Full code - Per ENT, patient to transfer to medical oncology after SICU- Appreciate palliative care assistance with GOC discussionsNotifications : Please call SICU Covering Provider assigned to patient via Epic EMR (primary) [facility] Dynamic Roles (secondary)[facility] 7-1 APP		[phone]:Mashal Slavick, PA-CSICU APP2/19/2022

## 2020-10-24 NOTE — Progress Notes
Surgical Intensive Care Unit  AttendingI have personally performed a face to face diagnostic evaluation on this patient, reviewed the chart, available data and care plan and have personally formulated the plan outlined below. I have seen this patient in addition to the advanced practitioner given patient's complexity and associated need for critical care. 55 y.o. male admitted on 10/09/2020 for respiratory failure secondary to airway obstruction due to advanced laryngeal Ca. He underwent trach change on 2/5. Subsequently had 2 episodes of RRT and mucus plugging. Transitioned to [facility] trach. Extubated 2/12. On Trach mask. Secretions improving. Hospital Day 14 ON vent for increased WOB. Sedated with propofol for radiation treatment.Past Medical History: Diagnosis Date ? Diabetes mellitus (HC Code) (HC CODE)  ? Hypercholesteremia  ? Hypertension  No past surgical history on file.Scheduled Meds:Current Facility-Administered Medications Medication Dose Route Frequency Provider Last Rate Last Admin ? albuterol neb sol 2.5 mg/3 mL (0.083%) (PROVENTIL,VENTOLIN)  2.5 mg Nebulization Q6H [place] Celeste, [place], [place]   2.5 mg at 10/23/20 1930 ? Banatrol Plus oral powder packet  1 packet Oral TID Lisette Abu, PA   1 packet at 10/23/20 2100 ? cefTAZIDime (FORTAZ) 2 g in sodium chloride 0.9 % 20 mL (100 mg/mL)  2 g Intravenous Q12H Mat Carne, APRN   2 g at 10/23/20 1004 ? chlorhexidine gluconate (PERIDEX) 0.12 % solution 15 mL  15 mL Mouth/Throat Q12H Leonia Reeves, PA   15 mL at 10/23/20 2100 ? clonazePAM (KlonoPIN) tablet 0.5 mg  0.5 mg Per J Tube BID Mat Carne, APRN   0.5 mg at 10/23/20 2100 ? doxepin (SINEquan) solution 10 mg  10 mg Per J Tube Nightly Mat Carne, APRN   10 mg at 10/23/20 2100 ? gabapentin (NEURONTIN) solution 100 mg  100 mg Per NG tube Q8H [place], [place], PA   100 mg at 10/23/20 1458 ? heparin (porcine) injection 5,000 Units  5,000 Units Subcutaneous Q8H Lisette Abu, PA   5,000 Units at 10/23/20 1610 ? insulin glargine (Semglee,Lantus) injection 20 Units  20 Units Subcutaneous Daily Lisette Abu, [place]   20 Units at 10/23/20 1102 ? insulin regular human (HumuLIN R, NovoLIN R) 100 unit/mL injection 26 Units  26 Units Subcutaneous Q6H Mat Carne, APRN   26 Units at 10/23/20 1754 ? insulin regular human (HumuLIN R, NovoLIN R) Sliding Scale (See admin instructions for dose)   Subcutaneous Q6H Lisette Abu, PA   4 Units at 10/23/20 1754 ? rocuronium (ZEMURON) 10 mg/mL injection          ? [START ON 10/24/2020] Vancomycin MAR Level   Intravenous Once Alric Quan, MD     ? Vancomycin Therapy Placeholder   Intravenous placeholder [place], Lauren, PA     Continuous Infusions:? dexmedetomidine 0.5 mcg/kg/hr (10/23/20 2019) ? dextrose 5 % 50 mL/hr (10/23/20 2000) ? [Held by provider] phenylephrine Stopped (10/23/20 1401) ? [Held by provider] propofol (DIPRIVAN) 1,000 mg in 100 mL (10 mg/mL) Stopped (10/23/20 1351) PRN Meds: acetaminophen, clonazePAM, dextrose 50% in water (D50W), dextrose 50% in water (D50W), HYDROmorphone, oxyCODONE **OR** oxyCODONE, zinc oxideVITALS:Temp (24hrs), Avg:37.6 ?C, Min:37.1 ?C, Max:38.1 ?CVitals:  10/23/20 1800 10/23/20 1900 10/23/20 1930 10/23/20 2000 BP: (!) 143/59 (!) 130/55  (!) 117/50 Pulse: 89 78 89 81 Resp: (!) 25 20 (!) 23 16 Temp: 37.6 ?C (!) 37.8 ?C  (!) 37.9 ?C TempSrc: Bladder Bladder  Bladder SpO2: 98% 100% 100% 100% Weight:     Height:     Intake/Output Summary (Last  24 hours) at 10/23/2020 2045Last data filed at 10/23/2020 2000Gross per 24 hour Intake 5518.48 ml Output 1557 ml Net 3961.48 ml Physical Exam Less Anxious looking. However he is tearful.On vent with CPAPCourse BSLabs Last 24 hours:Most Recent Result Component Value Date/Time  Sodium 148 (H) 10/23/2020 05:27 AM  Potassium 4.8 10/23/2020 05:27 AM  Chloride 106 10/23/2020 05:27 AM  CO2 28 10/23/2020 05:27 AM  BUN 55 (H) 10/23/2020 05:27 AM  Creatinine 2.19 (H) 10/23/2020 05:27 AM  WBC 7.6 10/23/2020 11:28 AM  Hemoglobin 6.3 (LL) 10/23/2020 11:28 AM  Hematocrit 20.60 (L) 10/23/2020 11:28 AM  Platelets 57 (L) 10/23/2020 11:28 AM Imaging Last 24 hours:No results found.Assessment / Plan:Neurologic:  Pain is being controlled with the current regimen: Oxy+Dilaudid+ Tylenol.Anxiety being managed.Marland KitchenPsych has recommended doxepin and clonazepam. Will change to pych recs. Sedated with Precedex.Renal dose gabapentinCardiac: Heart rate: Tachycardia. Improved after placing on vent. blood pressure: >65MAP goal: >65Respiratory: Pulmonary failure:Tumor not obstructing the trach or distally.placed on vent with improvement. Sedated with precedex. Radiotherapy and medical/palliative management.Plan for profol when he goes for radiation therapy. Wean O2 as tolerated.Continue nebs for secretion mobilization Infectious Disease/Sepsis:S/P MRSA pneumonia/ necrotic mass. Sputum positive for gram negative rods: add ceftazidime.Renal, Fluids/Electrolytes: AKI, Acute renal failure - Cr Stable.Follow Cr trendAppropriate urine output.Hypernatremia 151 -148. Continue free water flushes and D5WGI/Nutrition: Protein-calorie malnutrition - Albumin low.NPO except sips for comfort. Has diarrhea: Continue J-tube feeds at goal. Change to vital AF. Banatrol.Hematology:Acute blood loss anemia.Transfuse for Hct <7Walstrom globunemia: no intervention.Plts -low. No need for transfusion. VTE prophylaxis with heparin SQ. He has RIJ thrombus. Not a candidate for full anticoagulation as he is likley to bleed from the tumor (already has blood tinged secretions)Endocrine:  Insulin protocol for hyperglycemiaMusculoskeletal:  PT/OT as able.Skin/wound: Frequent repositioning to mitigate against pressure ulcerationGoals of Care/Advance Directives:Code status: Full codePalliative care is consulted and involved in care. Ongoing goals of care discussions. Will continue current management till next tuesday  Prophylaxis:  As abovePlan discussed with:  Primary Service, Critical Care Nursing, Respiratory Therapy, PharmacyCritical Care Time: 63 minRespiratory failure: Restarted on mechanical ventilation. [name] MDSICU Attending

## 2020-10-24 NOTE — Plan of Care
Plan of Care Overview/ Patient Status    Pt aao x4, anxious and tearful. MAE, PERRL. Dex d/c. No reports of pain. NSR, +2 pulses. Tmax 101.2 CPAP 12/5, suctioning q2h creamy tan secretions. Trach care completed. TF continued @ goal, BM x2 w/ bedpan. Foley w/ adequate UOP. Skin care completed. T&R q2h as tolerated. Sister updated by phone. See flowsheet for full assessment.

## 2020-10-24 NOTE — Progress Notes
Otolaryngology Progress NoteLenwood [name] is a 55 y.o. 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, trach/PEG transferred from [place] due to SOB w/exam significant for tumor invading the trach stoma and scope showing partial obstruction.Interim History-NAEO- accepted to med onc, awaiting bedVitalsTemp:  [98.78 ?F (37.1 ?C)-100.76 ?F (38.2 ?C)] 100.22 ?F (37.9 ?C)Pulse:  [67-89] 77Resp:  [1-27] 27BP: (81-143)/(46-80) 103/51SpO2:  [97 %-100 %] 100 %Device (Oxygen Therapy): mechanical ventilatorI/O last 3 completed shifts:In: 4490.4 [I.V.:1515.4; Blood:350; NG/GT:2625]Out: 1552 [Urine:1552]Physical ExamGen: alert, NADEyes: open, normal external appearanceFace: grossly symmetricNose: clear anteriorlyOP/OC: hemostaticNeck: Bivona 8 cuffed secured w/soft trach ties, necrotic but hemostatic mass surrounding the stoma, remaining neck flat, firm tumor Pulm: no increased WOB on TMRecent Labs Lab 02/18/221128 02/18/221128 02/19/220536 WBC 7.6   < > 7.9 HGB 6.3*   < > 7.0* HCT 20.60*   < > 22.30* MCV 92.4   < > 90.3 PLT 57*  --  79*  < > = values in this interval not displayed. Recent Labs Lab 02/18/220527 02/18/221049 02/19/220536 NA 148*  --  144 K 4.8  --  4.4 CL 106  --  104 CO2 28  --  27 BUN 55*  --  57* CREATININE 2.19*  --  2.29* GLU 194* - 162*   < > 194* - 199* CALCIUM 8.8  --  9.0 MG 2.3  --  2.2 PHOS 4.3  --  4.1  < > = values in this interval not displayed. Recent Labs Lab 02/14/220455 02/14/220455 02/15/220606 PTT 25.4   < > 24.2 INR 0.98  --  1.00  < > = values in this interval not displayed. 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 Admitted to SICU for ventilatory support. No acute ENT intervention planned.- no ENT intervention planned at this time; airway widely patent- case discussed at tumor board 2/14; not a surgical candidate - accepted for transfer to NP15 by Medical Oncology service; after transfer, will follow via switch to ENT consult service  - continue routine trach care with careful suctioning- ensure trach well secured with Charolette Child, MD (PGY-1)[facility]

## 2020-10-24 NOTE — Progress Notes
Inpatient Diabetes Management Team Follow-up Note    Over the past 24 hours, the patient's glucose control has been variable but close to goal. Febrile    ? Current Nutritional Status:  Vital AF 1.2 @ 95 cc/hr   ? Current Anti-hyperglycemic Regimen:  glargine 20 unit q noon, regular insulin 26 units q 6 hours, regular insulin MID dose sliding scale q 6 hours  ?  Home Anti-hyperglycemic Regimen: Toujeo U300 64 units daily, Apidra 20 units TID with meals, metformin 1000 mg bid   ? Other Contributing Medications: D5@ 53ml/hr discontinued    PE:    Temp:  [98.78 ?F (37.1 ?C)-100.76 ?F (38.2 ?C)] 100.04 ?F (37.8 ?C)  Pulse:  [67-89] 81  Resp:  [1-27] 22  BP: (81-143)/(46-80) 99/53  SpO2:  [97 %-100 %] 100 %  Device (Oxygen Therapy): mechanical ventilator     Wt Readings from Last 3 Encounters:   10/24/20 (!) 146.8 kg   10/06/20 (!) 139.5 kg   09/29/20 (!) 142.4 kg       Data Review:  BGs:      Creatinine (mg/dL)   Date Value   16/06/9603 2.29 (H)     Hemoglobin A1c (%)   Date Value   10/10/2020 9.0 (H)       Assessment:  55 yo h/o type 2 diabetes, HTN, Waldenstroms macroglobulinemia, ETOH, laryngeal SCC s/p trach/PEG presented with SOB significant for tumor invading the trach stoma with partial obstruction.  Diabetes team consulted for assistance in glycemic management.      BG over the past 24 hour have been variable but overall improving, slightly high. D5 was stopped. Legrand Rams continue on below He remains on tube feeds and he also remains on dextrose infusion for his hypernatremia. Given patient on continuous tube feedings, target blood glucose between 140mg /dl-180mg /dL.  If bolus regular insulin is administered and TFs are held patient is at risk of developing hypoglycemia. Please check BG q1 hour x6 hours and start IV fluids with D5 if BG < 100 mg/dL    Recommendations:   1. Continue glargine 20 units q noon   2. Continue regular insulin 26 units q 6 hours -- HOLD if tube feeds stopped or discontinued   3. Continue regular insulin mid dose sliding scale q 6 hours   4. Continue blood glucose monitoring q 6 hours   5. Diet: tube feeds per nutrition       Electronically Signed by Brantley Fling, MD   Endocrine Fellow - October 24, 2020  Mobile Heartbeat: [phone]  Endocrine Pager: [phone]

## 2020-10-24 NOTE — Other
PSYCHIATRY FOLLOW UPPt seen on 2/18 pm. The pt was started on doxepin 10 mg for sleep and his anti-anxiety regimen changed to clonazepam 0.5 mg twice per day. The pt appeared to be having anxiety attacks, unclear if full panic attacks: will note panic disorder relatively common in pts who have trach.The pt denied trouble sleeping on this rx (although notes disagree). He also denies anxiety, risk to self. Hypernatremia resolving.GOC discussion held with family: will continue radiation course for now.A+P1. Sleep. If continued problems, can consider doxepin 20 mg, else consider Seroquel2. Anxiety. If continued problems, can consider clonazepam 0.5 mg thrice per day3. GOC. Consideration of options with family involvement underway.Please contact us if further questions.Bobbye Riggs MD [facility] attending psychiatrist

## 2020-10-24 NOTE — Plan of Care
Plan of Care Overview/ Patient Status    Patient slept well over the night. Oriented with followed commends, MAE, PERRLA. SR on the monitor, palpable peripheral pulses, warm and dry skin, scrotum and perineal area contact dermatitis with excoriated was noted with zinc oxide cream allied. Tolerated to the ventilator. Moderated amounts of tracheal secretion. Patient was turned and repositioned as tolerated. Tolerated to the tube feeding. Adequate urine output. Please see flow sheet. Poc and electronic chart for details.

## 2020-10-24 NOTE — Other
[facility]    [facility]     Palliative Care Follow-Up Note    Present History   Chart reviewed and case discussed with SICU team. Mr. Huth was seen this morning sitting in recliner, trached to vent. He aroused to voice and was able to say that he is not currently having any pain, just some irritation from gown. He does not think his sister or niece have been in to visit recently. Did not engage with further questions, appearing to return to sleep. Per discussion with SICU team, plan is to readdress goals of care after Mr. Eckhardt completes radiation next week.     Spoke with patient's sister [name] and niece [name] on the phone as they were en route to the hospital this afternoon. They both received concerning phone calls and text messages overnight from [facility] when he was experiencing episodes of severe anxiety and they are understandably worried whether his plan of care is appropriate. We discussed that delirium and some of the medications he is receiving are likely contributing to his agitation and changes in mentation. As they understand it, the current plan is to continue with radiation, which is scheduled to conclude next week, and then discuss next steps in his care. They are questioning whether he would like to stop treatments now and transition to comfort-focused care.     SICU covering provider Jeanmarie Plant, APRN, and I subsequently met at the bedside with Mr. Vir, Heeb, and [name] this afternoon to readdress goals of care. After discussion of hospital course and expectations moving forward, Nicolus confirmed that he would like to continue with planned radiation. He and his family understand that we are unable to assess the effects of radiation at this time and that we will have to wait for Medical Oncology to clarify what his options are for further treatment after the completion of radiation. We also discussed code status, and [place] confirmed that he would like to remain Full Code at this time and potentially re-address after completion of radiation. We also were able to confirm that as his sister, [name] is next of kin and would assume responsibility as medical decision-maker in the event that Mr. Lyden is unable to make decisions for himself. Mahin is estranged from his wife, and family have been unable to reach her.      Review of Allergies/Hx/Meds   Allergies  Patient has No Known Allergies.    Medical History  Past Medical History:   Diagnosis Date   ? Diabetes mellitus (HC Code) (HC CODE)    ? Hypercholesteremia    ? Hypertension        Family History  Family History   Problem Relation Age of Onset   ? Aneurysm Mother        Social History  Social History     Socioeconomic History   ? Marital status: Legally Separated     Spouse name: Not on file   ? Number of children: Not on file   ? Years of education: Not on file   ? Highest education level: Not on file   Occupational History   ? Not on file   Tobacco Use   ? Smoking status: Former Smoker     Packs/day: 1.00     Years: 30.00     Pack years: 30.00     Quit date: 03/04/2013     Years since quitting: 7.6   Substance and Sexual Activity   ? Alcohol  use: Yes     Alcohol/week: 14.0 standard drinks     Types: 14 Cans of beer per week   ? Drug use: Not on file   ? Sexual activity: Not on file   Other Topics Concern   ? Not on file   Social History Narrative   ? Not on file     Social Determinants of Health     Financial Resource Strain:    ? Difficulty of Paying Living Expenses:    Food Insecurity:    ? Worried About Programme researcher, broadcasting/film/video in the Last Year:    ? Barista in the Last Year:    Transportation Needs:    ? Freight forwarder (Medical):    ? Lack of Transportation (Non-Medical):    Physical Activity:    ? Days of Exercise per Week:    ? Minutes of Exercise per Session:    Stress:    ? Feeling of Stress :    Social Connections:    ? Frequency of Communication with Friends and Family:    ? Frequency of Social Gatherings with Friends and Family:    ? Attends Religious Services:    ? Active Member of Clubs or Organizations:    ? Attends Banker Meetings:    ? Marital Status:    Intimate Partner Violence:    ? Fear of Current or Ex-Partner:    ? Emotionally Abused:    ? Physically Abused:    ? Sexually Abused:        InPatient Medications  Current Facility-Administered Medications   Medication Dose Route Frequency Last Rate   ? acetaminophen  650 mg Per G Tube Q6H PRN     ? albuterol  2.5 mg Nebulization Q6H WA     ? banana flakes-t-galactooligos.  1 packet Oral TID     ? cefTAZIDime  2 g Intravenous Q12H     ? chlorhexidine gluconate  15 mL Mouth/Throat Q12H     ? clonazePAM  0.5 mg Per J Tube BID     ? clonazePAM  0.5 mg Per J Tube Q12H PRN     ? dexmedetomidine  0.2-0.7 mcg/kg/hr Intravenous Continuous 0.4 mcg/kg/hr (10/23/20 1200)   ? dextrose 5 %  50 mL/hr Intravenous Continuous 50 mL/hr (10/23/20 1200)   ? dextrose 50% in water (D50W)  12.5 g IV Push Q15 MIN PRN     ? dextrose 50% in water (D50W)  25 g IV Push Q15 MIN PRN     ? doxepin  10 mg Per J Tube Nightly     ? gabapentin  100 mg Per NG tube Q8H     ? [Held by provider] heparin (porcine)  5,000 Units Subcutaneous Q8H     ? HYDROmorphone  0.5 mg IV Push Q4H PRN     ? insulin glargine  20 Units Subcutaneous Daily     ? insulin regular human  26 Units Subcutaneous Q6H     ? insulin regular human   Subcutaneous Q6H     ? oxyCODONE  5 mg Per G Tube Q4H PRN      Or   ? oxyCODONE  10 mg Per G Tube Q4H PRN     ? phenylephrine  0.25-9 mcg/kg/min Intravenous Continuous Stopped (10/21/20 1741)   ? phenylephrine         ? propofol (DIPRIVAN) 1,000 mg in 100 mL (10 mg/mL)  5-80 mcg/kg/min Intravenous Continuous 5 mcg/kg/min (  10/23/20 1223)   ? Propofol - Injection         ? rocuronium         ? [START ON 10/24/2020] Vancomycin MAR level   Intravenous Once     ? Vancomycin Therapy Placeholder   Intravenous placeholder     ? zinc oxide  1 Application Topical (Top) DAILY PRN         [email or web address]    Palliative Performance Scale 30%   (Modification of Karnofsky and was designed for measurement of physical status in Palliative Care. Only 10% of patients with PPS score <=50% would be expected to survive >6 months)    Physical Exam  Vitals and nursing note reviewed.   Constitutional:       Appearance: He is ill-appearing.      Comments: Arousable to voice but quickly falls back asleep this AM; more alert and interactive this afternoon   Pulmonary:      Effort: No respiratory distress.      Comments: Trached to vent  Neurological:      Mental Status: He is lethargic.      Comments: Sedated   Psychiatric:         Mood and Affect: Mood is anxious. Affect is tearful.         Vitals:  Temp:  [97 ?F (36.1 ?C)-100.58 ?F (38.1 ?C)] 99.5 ?F (37.5 ?C)  Pulse:  [65-89] 76  Resp:  [0-33] 1  BP: (81-136)/(43-71) 97/54  SpO2:  [95 %-100 %] 98 %     Wt Readings from Last 3 Encounters:   10/22/20 134 kg   10/06/20 (!) 139.5 kg   09/29/20 (!) 142.4 kg       Review of Labs/Diagnostics  Complete Blood Count:  Recent Labs   Lab 10/20/20  0606 10/21/20  0521 10/23/20  1128   WBC 8.8 8.8 7.6   HGB 7.7* 8.3* 6.3*   HCT 24.70* 28.20* 20.60*   PLT 60* 42* 57*     Comprehensive Metabolic Panel:  Recent Labs   Lab 10/21/20  0521 10/21/20  0523 10/22/20  0543 10/22/20  0544 10/23/20  0527 10/23/20  0527 10/23/20  1049   NA 147*  --  151*  --  148*  --   --    K 4.3  --  4.5  --  4.8  --   --    CL 104  --  108*  --  106  --   --    CO2 33*  --  31*  --  28  --   --    BUN 60*  --  59*  --  55*  --   --    CREATININE 2.26*  --  2.30*  --  2.19*  --   --    GLU 275*   < > 183*   < > 194* - 162*   < > 121*   CALCIUM 10.1  --  9.1  --  8.8  --   --     < > = values in this interval not displayed.     No results for input(s): ALKPHOS, BILITOT, BILIDIR, PROT, ALT, AST in the last 168 hours.    Invalid input(s): LABALBU    Diagnostics:  No results found.      Impression/Plan:   Wladyslaw Henrichs is a 55 y.o. 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 Admitted to SICU on 2/9 for ventilator management following RRT on oncology floor for desaturation and increased work of breathing while on trach mask, ultimately placed back on vent. Referred for Palliative Care Consult by Attending Provider: Alric Quan, MD [phone] for supportive care.    Cancer-related pain:  - Tylenol 650 mg q6hr PRN mild pain  - Oxycodone 5 mg PO q3hr PRN moderate pain OR oxycodone 10 mg PO q3hr PRN severe pain  - Dilaudid 0.5 mg IV q4hr PRN pain refractory to oral regimen   - gabapentin 100 mg q8hr     Anxiety  - Defer management to Psych, recommending standing and PRN Klonopin    Coping/Support:   - Ongoing assessments for psychosocial and/or spiritual needs  - Full interdisciplinary team support available as needed  - Appreciate primary team advocating with nurse management for an exemption to visitor restrictions to allow 2 visitors, as patient's sister reports mobility challenges making visitation on her own difficult  - Appreciate primary team and nursing helping facilitate communication between patient and his sister and niece    Advance Care Planning:  - Full code  - No advance directives or health care proxy documentation on file  - His sister and niece are listed as emergency contacts and it appears that his sister is next of kin and default proxy decision maker in the event that patient lacks capacity    Goals of Care:  - Patient confirms today that he would like to continue with current plan of care, with goal of completing radiation next week and then learning what options are for his care going forward    Thank you for involving the Palliative Care team in the care of this patient. The Palliative Care team will continue to follow.    Patient was evaluated: in person    Time committed: > 35 minutes were spent in chart review, coordination of care, and at the bedside in patient/family counseling and education, including detailed concepts of symptom managment and goals of care/advanced care planning.    Signed:  Debbrah Alar, APRN  Palliative Care Consult Service  10/23/2020 12:43 PM

## 2020-10-25 ENCOUNTER — Inpatient Hospital Stay: Admit: 2020-10-25 | Payer: PRIVATE HEALTH INSURANCE

## 2020-10-25 DIAGNOSIS — C329 Malignant neoplasm of larynx, unspecified: Secondary | ICD-10-CM

## 2020-10-25 DIAGNOSIS — J9601 Acute respiratory failure with hypoxia: Secondary | ICD-10-CM

## 2020-10-25 LAB — CBC WITH AUTO DIFFERENTIAL
BKR WAM ABSOLUTE IMMATURE GRANULOCYTES.: 0.14 x 1000/ÂµL (ref 0.00–0.30)
BKR WAM ABSOLUTE LYMPHOCYTE COUNT.: 0.26 x 1000/ÂµL — ABNORMAL LOW (ref 0.60–3.70)
BKR WAM ABSOLUTE NRBC (2 DEC): 0 x 1000/ÂµL (ref 0.00–1.00)
BKR WAM ANALYZER ANC: 7.06 x 1000/ÂµL (ref 2.00–7.60)
BKR WAM BASOPHIL ABSOLUTE COUNT.: 0.02 x 1000/ÂµL (ref 0.00–1.00)
BKR WAM BASOPHILS: 0.2 % (ref 0.0–1.4)
BKR WAM EOSINOPHIL ABSOLUTE COUNT.: 0.33 x 1000/ÂµL (ref 0.00–1.00)
BKR WAM EOSINOPHILS: 3.8 % (ref 0.0–5.0)
BKR WAM HEMATOCRIT (2 DEC): 21.8 % — ABNORMAL LOW (ref 38.50–50.00)
BKR WAM HEMOGLOBIN: 6.7 g/dL — ABNORMAL LOW (ref 13.2–17.1)
BKR WAM IMMATURE GRANULOCYTES: 1.6 % — ABNORMAL HIGH (ref 0.0–1.0)
BKR WAM LYMPHOCYTES: 3 % — ABNORMAL LOW (ref 17.0–50.0)
BKR WAM MCH (PG): 27.8 pg (ref 27.0–33.0)
BKR WAM MCHC: 30.7 g/dL — ABNORMAL LOW (ref 31.0–36.0)
BKR WAM MCV: 90.5 fL (ref 80.0–100.0)
BKR WAM MONOCYTE ABSOLUTE COUNT.: 0.77 x 1000/ÂµL (ref 0.00–1.00)
BKR WAM MONOCYTES: 9 % (ref 4.0–12.0)
BKR WAM MPV: 11.5 fL (ref 8.0–12.0)
BKR WAM NEUTROPHILS: 82.4 % — ABNORMAL HIGH (ref 39.0–72.0)
BKR WAM NUCLEATED RED BLOOD CELLS: 0 % (ref 0.0–1.0)
BKR WAM PLATELETS: 98 x1000/ÂµL — ABNORMAL LOW (ref 150–420)
BKR WAM RDW-CV: 17.2 % — ABNORMAL HIGH (ref 11.0–15.0)
BKR WAM RED BLOOD CELL COUNT.: 2.41 M/ÂµL — ABNORMAL LOW (ref 4.00–6.00)
BKR WAM WHITE BLOOD CELL COUNT: 8.6 x1000/ÂµL (ref 4.0–11.0)

## 2020-10-25 LAB — BASIC METABOLIC PANEL
BKR ANION GAP: 11 (ref 7–17)
BKR BLOOD UREA NITROGEN: 59 mg/dL — ABNORMAL HIGH (ref 6–20)
BKR BUN / CREAT RATIO: 24.2 — ABNORMAL HIGH (ref 8.0–23.0)
BKR CALCIUM: 9 mg/dL (ref 8.8–10.2)
BKR CHLORIDE: 105 mmol/L (ref 98–107)
BKR CO2: 28 mmol/L (ref 20–30)
BKR CREATININE: 2.44 mg/dL — ABNORMAL HIGH (ref 0.40–1.30)
BKR EGFR (AFR AMER): 34 mL/min/{1.73_m2} (ref >60)
BKR EGFR (NON AFRICAN AMERICAN): 28 mL/min/{1.73_m2} (ref >60)
BKR GLUCOSE: 113 mg/dL — ABNORMAL HIGH (ref 70–100)
BKR POTASSIUM: 4.3 mmol/L (ref 3.3–5.3)
BKR SODIUM: 144 mmol/L (ref 136–144)

## 2020-10-25 LAB — IRON AND TIBC
BKR IRON SATURATION: 17 % (ref 15–50)
BKR IRON: 22 ug/dL — ABNORMAL LOW (ref 59–158)
BKR TOTAL IRON BINDING CAPACITY: 133 ug/dL — ABNORMAL LOW (ref 250–450)

## 2020-10-25 LAB — RETICULOCYTES
BKR WAM IRF: 26.3 % — ABNORMAL HIGH (ref 3.0–15.9)
BKR WAM RETICULOCYTE - ABS (3 DEC): 0.073 10Ë6 cells/uL (ref 0.023–0.140)
BKR WAM RETICULOCYTE COUNT PCT (1 DEC): 3 % — ABNORMAL HIGH (ref 0.6–2.7)
BKR WAM RETICULOCYTE HGB EQUIVALENT: 30.3 pg (ref 28.2–35.7)

## 2020-10-25 LAB — ZZZBLOOD SMEAR MD INTERPRETATION     (YH)

## 2020-10-25 LAB — LACTATE DEHYDROGENASE: BKR LACTATE DEHYDROGENASE: 174 U/L (ref 122–241)

## 2020-10-25 LAB — PT/INR AND PTT (BH GH L LMW YH)
BKR INR: 1.1 (ref 0.87–1.14)
BKR PARTIAL THROMBOPLASTIN TIME: 26.2 s (ref 23.0–31.4)
BKR PROTHROMBIN TIME: 11.9 s (ref 9.6–12.3)

## 2020-10-25 LAB — MAGNESIUM: BKR MAGNESIUM: 2.1 mg/dL (ref 1.7–2.4)

## 2020-10-25 LAB — HAPTOGLOBIN: BKR HAPTOGLOBIN: 349 mg/dL — ABNORMAL HIGH (ref 30–200)

## 2020-10-25 LAB — FIBRINOGEN     (BH GH L LMW YH): BKR FIBRINOGEN LEVEL: 660 mg/dL — ABNORMAL HIGH (ref 194–448)

## 2020-10-25 LAB — FOLATE: BKR FOLATE: 19.3 ng/mL

## 2020-10-25 LAB — PHOSPHORUS     (BH GH L LMW YH): BKR PHOSPHORUS: 4.5 mg/dL (ref 2.2–4.5)

## 2020-10-25 LAB — VANCOMYCIN, RANDOM     (BH GH LMW YH): BKR VANCOMYCIN RANDOM: 14.8 ug/mL

## 2020-10-25 LAB — FERRITIN: BKR FERRITIN: 863 ng/mL — ABNORMAL HIGH (ref 30–400)

## 2020-10-25 LAB — VITAMIN B12: BKR VITAMIN B12: 1520 pg/mL — ABNORMAL HIGH (ref 232–1245)

## 2020-10-25 LAB — IMMATURE PLATELET FRACTION (BH GH LMW YH)
BKR WAM IPF, ABSOLUTE: 5.9 x1000/ÂµL (ref <20.0)
BKR WAM IPF: 6 % (ref 1.2–8.6)

## 2020-10-25 MED ORDER — NOREPINEPHRINE BITARTRATE 16 MG/250 ML (64 MCG/ML) IN 0.9 % NACL IV
1625064 mg/250 mL (64 mcg/mL) | INTRAVENOUS | Status: DC
Start: 2020-10-25 — End: 2020-10-27
  Administered 2020-10-26: 02:00:00 16 mL/h via INTRAVENOUS

## 2020-10-25 MED ORDER — SODIUM CHLORIDE 0.9 % (FLUSH) INJECTION SYRINGE
0.9 % | INTRAVENOUS | Status: DC | PRN
Start: 2020-10-25 — End: 2020-10-26

## 2020-10-25 MED ORDER — LORAZEPAM 2 MG/ML INJECTION SOLUTION
2 mg/mL | INTRAVENOUS | Status: DC | PRN
Start: 2020-10-25 — End: 2020-10-26
  Administered 2020-10-25: 17:00:00 2 mL via INTRAVENOUS

## 2020-10-25 MED ORDER — LORAZEPAM 2 MG/ML INJECTION SOLUTION
2 mg/mL | Freq: Four times a day (QID) | INTRAVENOUS | Status: DC | PRN
Start: 2020-10-25 — End: 2020-10-30
  Administered 2020-10-26 – 2020-10-29 (×5): 2 mL via INTRAVENOUS

## 2020-10-25 MED ORDER — VANCOMYCIN IVPB (2 G IN 500ML NS)
Freq: Once | INTRAVENOUS | Status: CP
Start: 2020-10-25 — End: ?
  Administered 2020-10-25: 14:00:00 500.000 mL/h via INTRAVENOUS

## 2020-10-25 MED ORDER — PROPOFOL 1,000 MG/100 ML IV INFUSION
1000 mg/100 mL | INTRAVENOUS | Status: DC
Start: 2020-10-25 — End: 2020-11-04
  Administered 2020-10-25 – 2020-11-04 (×64): via INTRAVENOUS

## 2020-10-25 MED ORDER — NOREPINEPHRINE BITARTRATE 4 MG/250 ML (16 MCG/ML) IN DEXTROSE 5 % IV
4 mg/250 mL (16 mcg/mL) | Status: DC
Start: 2020-10-25 — End: 2020-10-25

## 2020-10-25 MED ORDER — FENTANYL (PF) 50 MCG/ML INJECTION SOLUTION
50 mcg/mL | Freq: Once | INTRAVENOUS | Status: CP
Start: 2020-10-25 — End: ?
  Administered 2020-10-25: 18:00:00 50 mL

## 2020-10-25 MED ORDER — SODIUM CHLORIDE 0.9 % (FLUSH) INJECTION SYRINGE
0.9 % | Freq: Three times a day (TID) | INTRAVENOUS | Status: DC
Start: 2020-10-25 — End: 2020-10-26
  Administered 2020-10-25 (×2): 0.9 mL via INTRAVENOUS

## 2020-10-25 MED ORDER — VANCOMYCIN MAR LEVEL
Freq: Once | INTRAVENOUS | Status: CP
Start: 2020-10-25 — End: ?

## 2020-10-25 MED ORDER — NOREPINEPHRINE BITARTRATE 4 MG/250 ML (16 MCG/ML) IN DEXTROSE 5 % IV
425016 mg/250 mL (16 mcg/mL) | INTRAVENOUS | Status: DC
Start: 2020-10-25 — End: 2020-10-26

## 2020-10-25 MED ORDER — ROCURONIUM 10 MG/ML INTRAVENOUS SOLUTION
10 mg/mL | Freq: Once | INTRAVENOUS | Status: CP
Start: 2020-10-25 — End: ?
  Administered 2020-10-25: 18:00:00 10 mL via INTRAVENOUS

## 2020-10-25 MED ORDER — LORAZEPAM 0.5 MG TABLET
0.5 mg | Freq: Once | NASOGASTRIC | Status: CP
Start: 2020-10-25 — End: ?
  Administered 2020-10-25: 07:00:00 0.5 mg via NASOGASTRIC

## 2020-10-25 MED ORDER — ROCURONIUM 10 MG/ML INTRAVENOUS SOLUTION
10 mg/mL | Status: DC
Start: 2020-10-25 — End: 2020-11-02

## 2020-10-25 MED ORDER — MORPHINE 2 MG/ML INJECTION SYRINGE
2 mg/mL | INTRAVENOUS | Status: DC | PRN
Start: 2020-10-25 — End: 2020-10-26

## 2020-10-25 MED ORDER — NOREPINEPHRINE BITARTRATE 16 MG/250 ML (64 MCG/ML) IN 0.9 % NACL IV
16 mg/250 mL (64 mcg/mL) | Status: CP
Start: 2020-10-25 — End: ?
  Administered 2020-10-25: 18:00:00 16 mL/h via INTRAVENOUS

## 2020-10-25 MED ORDER — FENTANYL (PF) 50 MCG/ML INJECTION SOLUTION
50 mcg/mL | Status: CP
Start: 2020-10-25 — End: ?
  Administered 2020-10-25: 18:00:00 50 mcg/mL

## 2020-10-25 MED ORDER — NOREPINEPHRINE BITARTRATE 16 MG/250 ML (64 MCG/ML) IN 0.9 % NACL IV
16 mg/250 mL (64 mcg/mL) | INTRAVENOUS | Status: DC
Start: 2020-10-25 — End: 2020-10-26
  Administered 2020-10-25: 18:00:00 16 mL/h via INTRAVENOUS

## 2020-10-25 NOTE — Progress Notes
Surgical Intensive Care Unit  AttendingI have personally performed a face to face diagnostic evaluation on this patient, reviewed the chart, available data and care plan and have personally formulated the plan outlined below. I have seen this patient in addition to the advanced practitioner given patient's complexity and associated need for critical care. 55 y.o. male admitted on 10/09/2020 for respiratory failure secondary to airway obstruction due to advanced laryngeal Ca. He underwent trach change on 2/5. Subsequently had 2 episodes of RRT and mucus plugging. Transitioned to [facility] trach. Was on trach mask but could not clear secretions,Placed on vent with improved WOB. To keep on vent until he finishes his first course of radiation treatment (Tuesday 2/ 22)Hospital Day 15 Overnight: tolerating vent and is comfortable. Sedated with propofol for radiation treatment.Past Medical History: Diagnosis Date ? Diabetes mellitus (HC Code) (HC CODE)  ? Hypercholesteremia  ? Hypertension  No past surgical history on file.Scheduled Meds:Current Facility-Administered Medications Medication Dose Route Frequency Provider Last Rate Last Admin ? albuterol neb sol 2.5 mg/3 mL (0.083%) (PROVENTIL,VENTOLIN)  2.5 mg Nebulization Q6H [place], [place], [place]   2.5 mg at 10/24/20 1935 ? ascorbic acid (vitamin C) (VITAMIN C) tablet 250 mg  250 mg Per G Tube Daily Jennye Boroughs, PA   250 mg at 10/24/20 1144 ? Banatrol Plus oral powder packet  1 packet Per G Tube TID Jennye Boroughs, PA     ? chlorhexidine gluconate (PERIDEX) 0.12 % solution 15 mL  15 mL Mouth/Throat Q12H Leonia Reeves, PA   15 mL at 10/24/20 1610 ? ciprofloxacin HCl (CIPRO) tablet 750 mg  750 mg Per G Tube Q12H Jennye Boroughs, PA     ? clonazePAM (KlonoPIN) tablet 0.5 mg  0.5 mg Per J Tube BID Mat Carne, APRN   0.5 mg at 10/24/20 9604 ? cloNIDine HCL (CATAPRES) tablet 0.2 mg  0.2 mg Per G Tube Q6H Jennye Boroughs, PA   0.2 mg at 10/24/20 1517  Followed by ? Melene Muller ON 10/26/2020] cloNIDine HCL (CATAPRES) tablet 0.2 mg  0.2 mg Per G Tube Q8H Jennye Boroughs, PA      Followed by ? Melene Muller ON 10/28/2020] cloNIDine HCL (CATAPRES) tablet 0.2 mg  0.2 mg Per G Tube Q12H Jennye Boroughs, PA      Followed by ? Melene Muller ON 10/31/2020] cloNIDine HCL (CATAPRES) tablet 0.2 mg  0.2 mg Per G Tube Q24H Jennye Boroughs, PA     ? doxepin (SINEquan) solution 10 mg  10 mg Per J Tube Nightly Mat Carne, APRN   10 mg at 10/23/20 2100 ? folic acid (FOLVITE) tablet 1 mg  1 mg Per G Tube Daily [place], [place], [place]   1 mg at 10/24/20 1144 ? heparin (porcine) injection 5,000 Units  5,000 Units Subcutaneous Q8H Lisette Abu, PA   5,000 Units at 10/24/20 1517 ? insulin glargine (Semglee,Lantus) injection 20 Units  20 Units Subcutaneous Daily Lisette Abu, [place]   20 Units at 10/24/20 1145 ? insulin regular human (HumuLIN R, NovoLIN R) 100 unit/mL injection 26 Units  26 Units Subcutaneous Q6H Mat Carne, APRN   26 Units at 10/24/20 1819 ? insulin regular human (HumuLIN R, NovoLIN R) Sliding Scale (See admin instructions for dose)   Subcutaneous Q6H Lisette Abu, PA   2 Units at 10/24/20 1820 ? rocuronium (ZEMURON) 10 mg/mL injection          ? thiamine (VITAMIN B1) tablet 50 mg  50 mg Per G Tube Daily Jennye Boroughs,  PA   50 mg at 10/24/20 1151 ? [START ON 10/25/2020] Vancomycin MAR Level   Intravenous Once Alric Quan, MD     ? Vancomycin Therapy Placeholder   Intravenous placeholder [place], Lauren, PA     Continuous Infusions:PRN Meds: acetaminophen, clonazePAM, dextrose 50% in water (D50W), dextrose 50% in water (D50W), oxyCODONE **OR** oxyCODONE, zinc oxideVITALS:Temp (24hrs), Avg:38.1 ?C, Min:37.7 ?C, Max:38.4 ?CVitals:  10/24/20 1700 10/24/20 1800 10/24/20 1900 10/24/20 1936 BP: (!) 117/91 (!) 105/55 114/61  Pulse: (!) 105 (!) 109 (!) 109 (!) 109 Resp: (!) 13 (!) 13 (!) 27 (!) 23 Temp: (!) 38.4 ?C (!) 38.4 ?C (!) 38.3 ?C (!) 38.3 ?C TempSrc: Bladder Bladder   SpO2: 100% 100% 100% 100% Weight:     Height:     Intake/Output Summary (Last 24 hours) at 10/24/2020 2011Last data filed at 10/24/2020 1900Gross per 24 hour Intake 3523.61 ml Output 1820 ml Net 1703.61 ml Physical Exam Less Anxious looking. However he is tearful.Tolerating vent with BiPAP 12/5Course BSLabs Last 24 hours:Most Recent Result Component Value Date/Time  Sodium 144 10/24/2020 05:36 AM  Potassium 4.4 10/24/2020 05:36 AM  Chloride 104 10/24/2020 05:36 AM  CO2 27 10/24/2020 05:36 AM  BUN 57 (H) 10/24/2020 05:36 AM  Creatinine 2.29 (H) 10/24/2020 05:36 AM  WBC 7.9 10/24/2020 05:36 AM  Hemoglobin 7.0 (L) 10/24/2020 05:36 AM  Hematocrit 22.30 (L) 10/24/2020 05:36 AM  Platelets 79 (L) 10/24/2020 05:36 AM Imaging Last 24 hours:No results found.Assessment / Plan:Neurologic:  Pain is being controlled with the current regimen: Oxy+Dilaudid+ Tylenol.Anxiety being managed.with doxepin and clonazepam. Sedated with Precedex.Transition to clonidineRenal dose gabapentin - DCCardiac: Heart rate: Tachycardia. Improved after placing on vent. blood pressure: >65MAP goal: >65Respiratory: Pulmonary failure:Tumor not obstructing the trach or distally.placed on vent with improvement.Radiotherapy and medical/palliative management.Plan for profol when he goes for radiation therapy. Wean O2 as tolerated.Continue nebs for secretion mobilization Infectious Disease/Sepsis:S/P MRSA pneumonia/ necrotic mass. Sputum positive for gram negative rods: add ceftazidime. No growth of GN. Change to ciproRenal, Fluids/Electrolytes: AKI, Acute renal failure - Cr Stable.Follow Cr trendAppropriate urine output.Hypernatremia: improving. Continue free water flushes, DC D5WGI/Nutrition: Protein-calorie malnutrition - Albumin low.NPO except sips for comfort.  diarrhea: improved Continue J-tube feeds at goal.  vital AF +Banatrol.Hematology:Acute blood loss anemia.Transfuse for Hct <7Walstrom globunemia: no intervention.Plts -low. No need for transfusion. VTE prophylaxis with heparin SQ. He has RIJ thrombus. Not a candidate for full anticoagulation as he is likley to bleed from the tumor (already has blood tinged secretions)Endocrine:  Insulin protocol for hyperglycemiaMusculoskeletal:  PT/OT as able.Skin/wound: Frequent repositioning to mitigate against pressure ulcerationGoals of Care/Advance Directives:Code status: Full codePalliative care is consulted and involved in care. Ongoing goals of care discussions. Will continue current management till next tuesday  Prophylaxis:  As abovePlan discussed with:  Primary Service, Critical Care Nursing, Respiratory Therapy, PharmacyCritical Care Time: 60 minRespiratory failure: Restarted on mechanical ventilation. [name] MDSICU Attending

## 2020-10-25 NOTE — Other
1228: RT paged for any available breathing treatments due to pt c/o SOB despite stable VS. End tidal 32-37 per pt baseline, pt started showing an increase in agitation stating I can't breathe RT administered bronchodilator as ordered, pt had sudden drop in volume per ventilator alarms, pt bagged at bedside, MD attending and primary provider notified and at bedside. ENT paged for emergent scope of pt airway. 1254: Fentanyl IVP administered per order 1255: Propofol bolus given by MD attending 50mg , infusion started at 201255: Levo started at 0.66mcg/kg/min, MD Gilmore Laroche administered Rocurronim IVP 25mg  1255: ENT Laqueta Due, MD at bedside for scope, RT at bedside for suction of plug observed 1315: Rescope to suction plug from airway, ENT at bedside

## 2020-10-25 NOTE — Progress Notes
Otolaryngology Progress NoteLenwood [name] is a 55 y.o. 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, [place]/PEG [place] due to SOB w/exam significant for tumor invading the trach stoma and scope showing partial obstruction.Interim History- accepted to med onc, awaiting bed in MICU VitalsTemp:  [99.86 ?F (37.7 ?C)-101.12 ?F (38.4 ?C)] 99.86 ?F (37.7 ?C)Pulse:  [81-114] 100Resp:  [0-38] 22BP: (78-130)/(42-112) 92/63SpO2:  [98 %-100 %] 100 %Device (Oxygen Therapy): mechanical ventilatorI/O last 3 completed shifts:In: 3041.2 [I.V.:106.2; NG/GT:2935]Out: 1857 [Urine:1857]Physical ExamGen: alert, NADEyes: open, normal external appearanceFace: grossly symmetricNose: clear anteriorlyOP/OC: hemostaticNeck: Bivona 8 cuffed secured w/soft trach ties, necrotic but hemostatic mass surrounding the stoma, remaining neck flat, firm tumor Pulm: no increased WOB on TMRecent Labs Lab 02/19/220536 02/19/220536 02/20/220517 WBC 7.9   < > 8.6 HGB 7.0*   < > 6.7* HCT 22.30*   < > 21.80* MCV 90.3   < > 90.5 PLT 79*  --  98*  < > = values in this interval not displayed. Recent Labs Lab 02/19/220536 02/19/220938 02/20/220504 02/20/220517 02/20/220636 NA 144   < >  --  144  --  K 4.4   < >  --  4.3  --  CL 104   < >  --  105  --  CO2 27   < >  --  28  --  BUN 57*   < >  --  59*  --  CREATININE 2.29*   < >  --  2.44*  --  GLU 194* - 199*   < >   < > 113* 172* CALCIUM 9.0   < >  --  9.0  --  MG 2.2   < >  --  2.1  --  PHOS 4.1  --   --  4.5  --   < > = values in this interval not displayed. Recent Labs Lab 02/15/220606 02/15/220606 02/20/220517 PTT 24.2   < > 26.2 INR 1.00  --  1.10  < > = values in this interval not displayed. 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 Admitted to SICU for ventilatory support. No acute ENT intervention planned.- no ENT intervention planned at this time; airway widely patent- case discussed at tumor board 2/14; not a surgical candidate - continue routine trach care with careful suctioning- ensure trach well secured with Arthur Child, MD (PGY-1)[facility]

## 2020-10-25 NOTE — Significant Event
Significant EventNoticed patient leaning over the side of his bed and gasping. Upon entering room, he was not getting any tidal volumes via ventilator despite being attached at his tracheostomy. He quickly started becoming bradycardic 50s, hypoxic 80s, and somnolent; was taken off the ventilator and bagged with slow improvement. We reattached him to the ventilator and deep suctioned with minimal secretions; he continued his dyssynchronous breathing despite different ventilator mode changes --> returned to bagging patient. Dr Gilmore Laroche at bedside, Propofol gtt increased to 70, given bolus, Fentanyl IV, and 25mg  Rocuronium; then started on Levophed.ENT at bedside, and scoped patient revealing significant hard mucoid secretions attached to the distal trach opening with near full occlusion. Inline suction via ventilator likely able to advance through down to carina (suction catheter trauma noted around carina) but missing secretions directly at end of trach. A 10Fr suction catheter was passed through and swept circumferentially at the distal tip to remove the secretions. ENT also advanced the trach completely into the trachea, as CXR had showed some minimal retraction (8cm from carina, previously about 6cm).Repeat scope showed full clearance of the trach, and the patient's vent dyssynchrony improved.ASSESSMENT / PLAN:# Threatened Airway- Occlusive secretions at end of trach, not able to be removed by traditional suctioning. Likely related to circumferential tumor necrosis, as patient has been treated for pneumonia, and no new consolidations on CXR; improved aeration overall.- Twice per day, would remove patient from ventilator and use a 10Fr suction catheter to access around distal trach tip.- Additionally, could perform serial bronchoscopy if manual suctioning does not work.Jennye Boroughs, PA-C

## 2020-10-25 NOTE — Progress Notes
Arthur [name]	SICU Progress NoteAttending Provider: Alric [name], MDPost-Operative Day/Post Injury Day Procedure(s):2/5: Arthur [name] exchange (ENT) Hospital LOS: 16 days Interim History:  HPI: 55 y.o. male admitted to the SICU on 2/9 for ventilator management following an RRT on the oncology floor for desaturation and increased work of breathing while on trach mask and ultimately placed back on vent. ?Patient was initially a transfer from [facility] on 2/4 after trach and PEG when he had a partial obstruction of trach site from a new necrotic mass. He underwent trach exchange 2/5. Was on trach mask on oncology floor however had 2 episodes on floor requiring RRT for lavage and likely mucous plugging. ?Course Complicated by: - Incidental R IJ thrombus - Multiple episodes of presumed mucous plugging requiring RRT's ?PMH: Chronic ITP, Waldenstrom macroglobulinemia, HTN. HLD, DM, tobacco and alcohol use, Laryngeal SCC (weekly Cisplatin and RT)?PSH: None known ?Home meds: Lipitor, Pepcid, Lisinopril, Metformin, Oxycodone, miralax, Insulin U100 and Lantus SICU course:2/9: Arrives to SICU HDS, on vent PCV. Appears well. Transitioned to Pressure support on vent and tolerating well. O/N: Episode of patient feeling like not getting a full breath, extremely tachypenic and increased work of breathing and tachycardia, attempted suctioning and lavage without improvement, attempted to switch to PCV and VAC but due to dyssynchrony unable to tolerate, no desat but poor ventilation and TV and ETCO2 up to 100. ENT at bedside do not feel additional scope would benefit as he was just scoped a few hours ago, ultimately gave fentanyl push and started propofol for vent synchrony but despite max prop still dyssynchronous. Gave one dose of roccuronium once sedated for vent synchrony with good effect. Albuterol and HTS neb given and peaks improved from 50 to 30's. No further paralytic needed and now synchronous on VAC. Kept on propofol for vent synchrony. CXR reassuring and back to GCS 11T. Hypotension only following sedation and Rocc, Low dose Neo started. 2/10: Kept on higher sedation in AM for [place] A/P (for staging) and soft tissue neck. Weaning sedation and will plan for PSV trial once more awake. Scoped by ENT in AM, airway patent. Q1-2hr suction requirements of thick tan secretions. No further episodes of respiratory distress although sedation remains high. Tube feeds changed to continuous given significant hyperglycemia. Insulin regimen changed per diabetes team recommendations. Pressure ulceration noted around tracheostomy --> ENT at bedside to evaluation, likely some component of invasive tumor growth. Palliative care consult for assist with symptom management and family moving forward. Primary oncologist office made aware of referral. O/N: weaning down on Prop, no episodes of respiratory distress or desats overnight. Febrile to 101.3, sputum and blood cultures ordered. 500cc LR bolus given for some oliguria and rising Cr. 2/11: Given 500cc NS for oliguria with good response. Vanco level yesterday 25, likely cause of worsening aki, on hold. UOP & lytes remains stable. Insulin adjusted per diabetes team. PSV as tolerated (doing well). CPT added. Confirmed with ENT pulm recs for blue portex suctionaid trach- no plans to switch at this time. Bowel regimen added. Plan keep on neo if low dose but if inc, consider changing to levo as likely then not in setting of sedation. Family updated on phone. Confirmed w heme- no AC rec for R IJ thrombus until plt > 50. Was going to attempt to place pt on TM overnight as he did very well on PSV x 3hrs, however, pt had anxiety attack regarding his cancer and prognosis, he requested to sleep during night and was agreeable to TM in  am. O/N: Patient agrees to PSV overnight which he did very well on. Agreeable to getting OOB to chair and possibly trach mask in AM but wants to stay on vent tonight. Off Neo by AM.  2/12: trial trach mask as tolerated this AM. Adjusted insulin regimen per the diabetes management service. Sputum now with staph, awaiting sensitivities. More loose stools today; nursing placed rectal tube and held bowel regimen.  ON: Anxious at times, feeling like he cannot breath but doing well on TM. Frequent suctioning. Increased standing. FIO2 increased to 40%.  insulin to 14U per endo reccs. 2/13: continue to adjust insulin regimen, now 20u regular q6. Cr continues to uptrend 2.57 --> 2.63 and repeat vanco level still elevated 24.6; pharmacy to continue dosing adjustments. Otherwise, remains on trach mask. Sputum with MRSA --> d/c ceftriaxone. ON: increased standing insulin to 24U. 2/14: Will discuss [facility] plan with hematology now that PLT > 50 x2 checks. Will also d/c vancomycin today (10d of therapy thus far). AKI appears to have peaked at 2.57 -> 2.63 -> 2.47. Awaiting transfer to the oncology service on NP 15. Okay to start [facility], but will hold off today given more bloody secretions. 2/15: Plan to start palliative RT today. AM session aborted due to extreme anxiety (despite premedication with IV ativan), tried again in the PM with 10mg  IV Valium + 3mg  IV ativan, procedure again aborted due to extreme anxiety and inability to tolerate. Pending discussion with rad onc in AM re: utility of ongoing attempts at RT. Pending transfer to NP15 on med onc service.  O/N: increasingly anxious / restless, but able to re-direct and calm down with PRN ativan. Liquid stools, placed rectal tube. Will start banatrol and check CBC in AM, check c dif. This had previously improved by holding bowel regimen.2/16: Acutely increased WOB, HR 130s, SBP 170s --> ENT bedside scope with trach patent, though notable secretions --> suctioned with minimal improvement; placed back on ventilator PSV 10 / +5 with immediate improvement in VS and respiratory distress. Precedex gtt started to assist more frequent suctioning. CXR with LLL consolidation --> resend sputum, and restart Vanco (close trough monitoring with AKI) given known MRSA PNA. Increased diarrhea, cdiff negative, transition to Vital TF. LR 500cc for hemoconcentration of labs, tachycardia. Appreciate radiation oncology; sedated patient with Propofol gtt, bolus x1 with good effect --> able to tolerate 15 minute radiation session; plan to repeat Thursday, Friday, Monday, and Tuesday. 2/17: Psych consulted for depression--> Doxepin added, Klonopin added. Hypernatremia--> D5W and free water flushes added. Ceftaz added for GNR in sputum. Plan for family discussion this PM (see separate note) and radiation this PM. Foley replaced for retention. ON: Tolerated PSV 15/5. Repeat Na 148.2/18: Plan for radiation this afternoon. Rectal tube removed.. Hgb 6.3--> transfused 1un PRBCs. Discussed care with sister and niece at bedside, remain full code and plan to continue radiation course through Tuesday (see family communication note).2/19: PSV weaned 15 --> 12/+5. Start Clonidine and discontinue Precedex gtt. Discontinue Gabapentin and Dilaudid IV, decrease Oxycodone to 2.5 / 5. Na improved 144, discontinue D5W; sugars should improve, maintain current regimen per DM team. GNR per micro lab not pseudomonas, klebsiella, or other enteric organism; would have speciated --> change Ceftaz to PO Cipro, to complete course 2/23 for one week. Cr again increased with Vanco level over 20 --> next dose held, monitor level closely. Hgb remains low despite 1U yesterday, 6.3 --> 7.0; start multivitamins and check with Heme. ON: Low grade fevers stable. Given ativan  0.5mg  in place of klonipin with good anxiolytic effect. Transfuse 1u PRBC (likely half unit) for Hgb 6.7.2/20: Acute respiratory distress this morning after turn, minimal secretions, did not improve with increased pressure or VAC --> bagged x5 mins with improvement, placed on PCV, IP 20 --> CXR pending, start Propofol, add back Ativan and Morphine PRN. Appreciate heme assistance with persistent anemia. Cr continues to rise following elevated vanco trough; hold any further doses, level will likely remain therapeutic to final date of course (2/22) --> unfortunately, patient did receive additional dose of Vanco 2g per pharmacy order this morning; discontinued placeholder, consider Doxycycline if Vanco level subtherapeutic prior to end date.Review of Allergies/Meds/Hx: I have reviewed the patient's: allergies, past medical history, past surgical history, family history, social history, prior to admission medicationsObjective: Vitals:I have reviewed the patient's current vital signs as documented in the EMRTemp:  [99.68 ?F (37.6 ?C)-101.12 ?F (38.4 ?C)] 100.22 ?F (37.9 ?C)Pulse:  [86-127] 96Resp:  [0-38] 33BP: (78-166)/(42-129) 166/129SpO2:  [94 %-100 %] 94 %SpO2: 94 %Device (Oxygen Therapy): mechanical ventilatorI/O's:I have reviewed the patient's current I&O's as documented in the EMR.Gross Totals (Last 24 hours) at 10/25/2020 1059Last data filed at 10/25/2020 0830Intake 2750 ml Output 1682 ml Net 1068 ml Physical ExamConstitutional:     General: Anxious gentleman appearing older than stated age. HENT:    Head: Normocephalic and atraumatic.    Mouth: Mucous membranes are moist.    Extraocular Movements: Extraocular movements intact.    Pupils: Pupils are equal, round, and reactive to light.    Comments: Tracheostomy in placeCardiovascular:    Rate and Rhythm: Regular rhythm. Regular rate   Heart sounds: No murmur heard. Pulmonary:    Breath sounds: No wheezing, rhonchi or rales.    Comments: Ventilated via trachAbdominal:    General: There is no distension.    Tenderness: There is no abdominal tenderness.    Comments: PEG tube in place with TFs runningGenitourinary: Comments: Foley in place draining clear, yellow urineMusculoskeletal:    Lower legs: Mild edema   Comments: Moves all extremities equally, to command.Skin:   General: Skin is warm and dry.    Capillary Refill: Capillary refill takes less than 2 seconds. Neurological:    General: No focal deficit present.    Mental Status: He is alert.    Cranial Nerves: No cranial nerve deficit.    Motor: No weakness. Labs: Refer to EMR. I have reviewed the patient's labs within the last 24 hours; see assessment and plan below for significant abnormals.Microbiology:Refer to EMR. I have reviewed all new results within the last 24 hours; see assessment and plan below.Diagnostics:Refer to EMR. I have reviewed all new results within the last 24 hours; see assessment and plan below.Chest ZO:XWRUE to EMR. I have reviewed all new results within the last 24 hours; see assessment and plan below.Abdominal AV:WUJWJ to EMR. I have reviewed all new results within the last 24 hours; see assessment and plan below.ECG/Tele Events: I have reviewed the patient's ECG and telemetry as resulted in the EMR.Assessment/Plan Neurologic: Analgesia- Tylenol 650 Q6 PRN - Oxycodone 2.5 / 5mg  Q4 PRN - Morphine 2mg  Q4 PRN breakthroughAnxiety- Clonidine taper s/p Precedex gtt- Propofol gtt PRN acute agitation- Klonopin 0.5mg  BID- Restart Ativan 1mg  Q6 PRN IV acute agitation- Doxepin 10mg  QHS- Appreciate palliative care and psych consults?ENT: L laryngeal SCC, invading tracheostomy: s/p trach exchange 10/10/20 and biopsy 10/12/20- Bivona trach to bypass obstruction - Unfortunately, there are no surgical / curative options- Palliative radiation x5 days (2/16, 2/17, 2/18,  2/21, 2/22); requires VAC with full propofol sedation?Cardiac: Hx of HTN / HLD- Hold home lisinopril, lasix, crestor given AKI?Respiratory:  Respiratory failure- Due to tumor invasion of tracheal stoma; though trach is patent, tumor appears circumferential; also complicated by high secretion burden and frequent mucous plugging- PSV 12/+5 --> PCV: IP 20 for acute respiratory distress this morning- Albuterol Q6?GI: Diarrhea- Change TF to Vital @ 95- Banatrol TID?Renal: AKI on CKD (baseline Cr 1.5-1.6), 2/2 supratherapeutic vanco level- Cr peaked 2.63 --> uptrending again with vanco level ~20, similar to last event; HOLD next dose- Foley replaced 2/17 for urinary retentionHypovolemic hypernatremia- Free water flushes 250cc Q8?Infectious Disease: MRSA PNA- Vanco (2/5 - 2/14), resumed (2/16 - 2/22) * LEVEL CANNOT GO ABOVE 204+ GNR PNA- Per Micro, cannot speciate, but confirmed no enteric organism, pseudomonas, klebsiella, ecoli- Ceftaz (2/17- 2/19), change to Cipro (2/19 - 2/23)?Hematology:Critical illness anemia- Sanguinous secretions, expected given tumor burden and coagulopathy, may worsen with radiation and baseline hematologic disease- Multivitamins and cofactors- Maintain Hgb >7 --> 2U RBC in the last 48 hours; appreciate heme reconsult?Hx of chronic ITP, Waldenstrom macroglobulinemia- Transfuse platelets <50 if bleeding, <10 if no active bleeding- Appreciate hematology recommendations?R IJ thrombus - High risk heparin gtt when plt consistently >50- Hep SC - Maintain SCDs at all times?Endocrine: Hx T2DM- Lantus 20u daily- Regular insulin 26u Q6- Regular MDISS- HOLD home Metformin, Lantus 64u, Apidra 20u TID w meals- Appreciate diabetes team recsMusculoskeletal: Generalized deconditioning- OOB daily, PT/OT evals- Turn and reposition Q2 hours, PUP dressing?Device Review:- R IJ Port (08/12/20) per guidelines, nonocclusive DVT does not require removal of line- PEG (09/14/20)- Bivona trach (10/10/20)?Code Status / Dispo:- Full code - Per ENT, patient to transfer to medical oncology after SICU- Appreciate palliative care assistance with GOC discussionsNotifications : Please call SICU Covering Provider assigned to patient via Epic EMR (primary) [facility] Dynamic Roles (secondary)[facility] 7-1 APP		[phone]:Landin Tallon, PA-CSICU APP2/20/2022

## 2020-10-25 NOTE — Plan of Care
Plan of Care Overview/ Patient Status    0700: Pt A&Ox4 when care assumed of patient. Pt emotionally labile, teary, asking questions such as am I going to die? When will I die?1000: Pt friend Arthur [name] [phone] appeared on unit and stated he was texted by the pt (pt confirmed this) that he was, here to pick Arthur [name] up to bring him home. Arthur [name] texted me to bring his car and be here at 1000 to pick him up and bring him home. Pt friend brought to bedside per pt request, friend was unaware of pt condition. Pt communicated recent months worth of visits to his friend and his current medical state and conditions. Pt friend visibly upset and states he was told by pt's sister last night that [place] communicated to her that pt wished to speak with his friend. 1500: Pt A&Ox4, wrote on white board, I want to know my prognosis, I have a right to know, I am helpless. Pt teary, states,  No one has told me anything. Pt also states in same conversation,  I still have chemo. I want to die at home. Pt remains A&Ox4  Primary care team in ICU notified and is aware.

## 2020-10-25 NOTE — Plan of Care
Plan of Care Overview/ Patient Status    A+ox4, mae, fc, he is very tearful and sad tonite, affect pretty flat, pupils e/r/b , denies pain , doxepin / clonopin for anxiety w clonidine taper p precedex , ativan 0.5 x 1 (0200) at pt request to relaxLung sounds coarse bilat , thick tan / pink secretions via #8 bivona trach , using yankauer for oral secretions indep , strong cough , continue CPAP 12/5 - 30% - satting well , trach ties changed x 2 - skin care surrounding trach completed, firm tumor noted w moisture most notable at inferior aspect - no bleeding currently, rr 25-40, nebs per rtHr 85-110 , st on tele, sbp 90-140 , palp pulses x 4 ext , temp 101.1 - tylenol w good effect , skin wam and dry , denies cp / palps, 1 u prbc ordered for 6.7/21.8Abd obese , snt/nd , + bs x 4 quads , tol vital af tf @ 95 via peg tube ([facility]) , bag changed tonite, skin dry intact around peg, denies n, no vFoley cath in place w adequate amounts of c/y/u to bagSkin around on scrotum and groin folds very excoriated but showing some improvement - butt paste applied , also bilat buttocks excoriated - zinc applied as needed, R chest wall port in place / L upper arm ext dwell / L ac # 20 - all patent w dressings c/d/Iwctm

## 2020-10-26 ENCOUNTER — Ambulatory Visit: Admit: 2020-10-26 | Payer: PRIVATE HEALTH INSURANCE

## 2020-10-26 DIAGNOSIS — J9601 Acute respiratory failure with hypoxia: Secondary | ICD-10-CM

## 2020-10-26 LAB — CBC WITH AUTO DIFFERENTIAL
BKR WAM ABSOLUTE IMMATURE GRANULOCYTES.: 0.12 x 1000/ÂµL (ref 0.00–0.30)
BKR WAM ABSOLUTE LYMPHOCYTE COUNT.: 0.33 x 1000/ÂµL — ABNORMAL LOW (ref 0.60–3.70)
BKR WAM ABSOLUTE NRBC (2 DEC): 0 x 1000/ÂµL (ref 0.00–1.00)
BKR WAM ANALYZER ANC: 7.3 x 1000/ÂµL (ref 2.00–7.60)
BKR WAM BASOPHIL ABSOLUTE COUNT.: 0.03 x 1000/ÂµL (ref 0.00–1.00)
BKR WAM BASOPHILS: 0.3 % (ref 0.0–1.4)
BKR WAM EOSINOPHIL ABSOLUTE COUNT.: 0.34 x 1000/ÂµL (ref 0.00–1.00)
BKR WAM EOSINOPHILS: 3.8 % (ref 0.0–5.0)
BKR WAM HEMATOCRIT (2 DEC): 29.4 % — ABNORMAL LOW (ref 38.50–50.00)
BKR WAM HEMOGLOBIN: 9.1 g/dL — ABNORMAL LOW (ref 13.2–17.1)
BKR WAM IMMATURE GRANULOCYTES: 1.3 % — ABNORMAL HIGH (ref 0.0–1.0)
BKR WAM LYMPHOCYTES: 3.7 % — ABNORMAL LOW (ref 17.0–50.0)
BKR WAM MCH (PG): 27.6 pg (ref 27.0–33.0)
BKR WAM MCHC: 31 g/dL (ref 31.0–36.0)
BKR WAM MCV: 89.1 fL (ref 80.0–100.0)
BKR WAM MONOCYTE ABSOLUTE COUNT.: 0.78 x 1000/ÂµL (ref 0.00–1.00)
BKR WAM MONOCYTES: 8.8 % (ref 4.0–12.0)
BKR WAM MPV: 11.4 fL (ref 8.0–12.0)
BKR WAM NEUTROPHILS: 82.1 % — ABNORMAL HIGH (ref 39.0–72.0)
BKR WAM NUCLEATED RED BLOOD CELLS: 0 % (ref 0.0–1.0)
BKR WAM PLATELETS: 137 x1000/ÂµL — ABNORMAL LOW (ref 150–420)
BKR WAM RDW-CV: 18.5 % — ABNORMAL HIGH (ref 11.0–15.0)
BKR WAM RED BLOOD CELL COUNT.: 3.3 M/ÂµL — ABNORMAL LOW (ref 4.00–6.00)
BKR WAM WHITE BLOOD CELL COUNT: 8.9 x1000/ÂµL (ref 4.0–11.0)

## 2020-10-26 LAB — PHOSPHORUS     (BH GH L LMW YH): BKR PHOSPHORUS: 3.2 mg/dL (ref 2.2–4.5)

## 2020-10-26 LAB — BASIC METABOLIC PANEL
BKR ANION GAP: 14 (ref 7–17)
BKR BLOOD UREA NITROGEN: 60 mg/dL — ABNORMAL HIGH (ref 6–20)
BKR BUN / CREAT RATIO: 23.9 — ABNORMAL HIGH (ref 8.0–23.0)
BKR CALCIUM: 9.4 mg/dL (ref 8.8–10.2)
BKR CHLORIDE: 102 mmol/L (ref 98–107)
BKR CO2: 26 mmol/L (ref 20–30)
BKR CREATININE: 2.51 mg/dL — ABNORMAL HIGH (ref 0.40–1.30)
BKR EGFR (AFR AMER): 33 mL/min/{1.73_m2} (ref >60)
BKR EGFR (NON AFRICAN AMERICAN): 27 mL/min/{1.73_m2} (ref >60)
BKR GLUCOSE: 219 mg/dL — ABNORMAL HIGH (ref 70–100)
BKR POTASSIUM: 4.8 mmol/L (ref 3.3–5.3)
BKR SODIUM: 142 mmol/L (ref 136–144)

## 2020-10-26 LAB — METHYLMALONIC ACID: BKR METHYLMALONIC ACID: 0.42 umol/L — ABNORMAL HIGH (ref 0.00–0.40)

## 2020-10-26 LAB — MAGNESIUM: BKR MAGNESIUM: 2.4 mg/dL (ref 1.7–2.4)

## 2020-10-26 MED ORDER — INSULIN U-100 REGULAR HUMAN 100 UNIT/ML INJECTION SOLUTION
100 unit/mL | Freq: Four times a day (QID) | SUBCUTANEOUS | Status: DC
Start: 2020-10-26 — End: 2020-11-02
  Administered 2020-10-26 – 2020-10-27 (×3): 100 mL via SUBCUTANEOUS

## 2020-10-26 MED ORDER — INSULIN GLARGINE 100 UNIT/ML SUBCUTANEOUS SOLUTION
100 unit/mL | Freq: Every day | SUBCUTANEOUS | Status: DC
Start: 2020-10-26 — End: 2020-10-27

## 2020-10-26 MED ORDER — DEXAMETHASONE SODIUM PHOSPHATE (PF) 10 MG/ML INJECTION SOLUTION
10 mg/mL | Freq: Two times a day (BID) | INTRAVENOUS | Status: DC
Start: 2020-10-26 — End: 2020-10-27
  Administered 2020-10-27: 02:00:00 10 mL via INTRAVENOUS

## 2020-10-26 MED ORDER — LACTATED RINGERS IV BOLUS (NEW BAG)
Freq: Once | INTRAVENOUS | Status: CP
Start: 2020-10-26 — End: ?
  Administered 2020-10-26: 15:00:00 250.000 mL/h via INTRAVENOUS

## 2020-10-26 MED ORDER — PROPOFOL 10 MG/ML INTRAVENOUS EMULSION
10 mg/mL | Status: CP
Start: 2020-10-26 — End: ?
  Administered 2020-10-30: 02:00:00 10 mL/h via INTRAVENOUS

## 2020-10-26 MED ORDER — INSULIN GLARGINE 100 UNIT/ML SUBCUTANEOUS SOLUTION
100 unit/mL | Freq: Every evening | SUBCUTANEOUS | Status: DC
Start: 2020-10-26 — End: 2020-10-27
  Administered 2020-10-27: 02:00:00 100 mL via SUBCUTANEOUS

## 2020-10-26 NOTE — Progress Notes
Surgical Intensive Care Unit  AttendingI have personally performed a face to face diagnostic evaluation on this patient, reviewed the chart, available data and care plan and have personally formulated the plan outlined below. I have seen this patient in addition to the advanced practitioner given patient's complexity and associated need for critical care. 55 y.o. male admitted on 10/09/2020 for respiratory failure secondary to airway obstruction due to advanced laryngeal Ca. He underwent trach change on 2/5. Subsequently had 2 episodes of RRT and mucus plugging. Transitioned to [facility] trach. Was on trach mask but could not clear secretions,Placed on vent with improved WOB. To keep on vent until he finishes his first course of radiation treatment (Tuesday 2/ 22)Hospital Day 16 Overnight: tolerating vent and is comfortable.However, early this am developed respiratory distress requiring deeper  sedaton and PCV ventilation. Mucus plugging this afternoon, requiring deep suctioning after sedation and muscle relaxation. Sedated with propofol for radiation treatment.Past Medical History: Diagnosis Date ? Diabetes mellitus (HC Code) (HC CODE)  ? Hypercholesteremia  ? Hypertension  No past surgical history on file.Scheduled Meds:Current Facility-Administered Medications Medication Dose Route Frequency Provider Last Rate Last Admin ? albuterol neb sol 2.5 mg/3 mL (0.083%) (PROVENTIL,VENTOLIN)  2.5 mg Nebulization Q6H [place], [place], [place]   2.5 mg at 10/25/20 2018 ? ascorbic acid (vitamin C) (VITAMIN C) tablet 250 mg  250 mg Per G Tube Daily Jennye Boroughs, PA   250 mg at 10/25/20 1610 ? Banatrol Plus oral powder packet  1 packet Per G Tube TID Jennye Boroughs, [place]   1 packet at 10/25/20 2041 ? chlorhexidine gluconate (PERIDEX) 0.12 % solution 15 mL  15 mL Mouth/Throat Q12H Leonia Reeves, PA   15 mL at 10/25/20 2037 ? ciprofloxacin HCl (CIPRO) tablet 750 mg  750 mg Per G Tube Q12H Jennye Boroughs, PA   750 mg at 10/25/20 2104 ? clonazePAM (KlonoPIN) tablet 0.5 mg  0.5 mg Per J Tube BID Mat Carne, APRN   0.5 mg at 10/25/20 2038 ? cloNIDine HCL (CATAPRES) tablet 0.2 mg  0.2 mg Per G Tube Q6H Jennye Boroughs, PA   0.2 mg at 10/25/20 0845  Followed by ? Melene Muller ON 10/26/2020] cloNIDine HCL (CATAPRES) tablet 0.2 mg  0.2 mg Per G Tube Q8H Jennye Boroughs, PA      Followed by ? Melene Muller ON 10/28/2020] cloNIDine HCL (CATAPRES) tablet 0.2 mg  0.2 mg Per G Tube Q12H Jennye Boroughs, PA      Followed by ? Melene Muller ON 10/31/2020] cloNIDine HCL (CATAPRES) tablet 0.2 mg  0.2 mg Per G Tube Q24H Jennye Boroughs, PA     ? doxepin (SINEquan) solution 10 mg  10 mg Per J Tube Nightly Mat Carne, APRN   10 mg at 10/25/20 2050 ? folic acid (FOLVITE) tablet 1 mg  1 mg Per G Tube Daily [place], [place], [place]   1 mg at 10/25/20 9604 ? heparin (porcine) injection 5,000 Units  5,000 Units Subcutaneous Q8H Lisette Abu, PA   5,000 Units at 10/25/20 2104 ? insulin glargine (Semglee,Lantus) injection 20 Units  20 Units Subcutaneous Daily Lisette Abu, [place]   20 Units at 10/25/20 1326 ? insulin regular human (HumuLIN R, NovoLIN R) 100 unit/mL injection 26 Units  26 Units Subcutaneous Q6H Mat Carne, APRN   26 Units at 10/25/20 1833 ? insulin regular human (HumuLIN R, NovoLIN R) Sliding Scale (See admin instructions for dose)   Subcutaneous Q6H Lisette Abu, PA   6 Units at 10/25/20 5409 ?  rocuronium (ZEMURON) 10 mg/mL injection          ? thiamine (VITAMIN B1) tablet 50 mg  50 mg Per G Tube Daily Jennye Boroughs, PA   50 mg at 10/25/20 1610 ? [START ON 10/26/2020] Vancomycin MAR Level   Intravenous Once Alric Quan, MD     Continuous Infusions:? norepinephrine 0.02 mcg/kg/min (10/25/20 2127) ? [Held by provider] propofol (DIPRIVAN) 1,000 mg in 100 mL (10 mg/mL) Stopped (10/25/20 1414) PRN Meds: acetaminophen, dextrose 50% in water (D50W), dextrose 50% in water (D50W), LORazepam, morphine (ADULT), oxyCODONE **OR** oxyCODONE, zinc oxideVITALS:Temp (24hrs), Avg:37.9 ?C, Min:37.6 ?C, Max:38.2 ?CVitals:  10/25/20 2115 10/25/20 2130 10/25/20 2145 10/25/20 2200 BP: (!) 85/48 (!) 91/45 (!) 91/48 (!) 91/50 Pulse: (!) 101 (!) 96 (!) 92 (!) 91 Resp: 18 18 18 18  Temp: (!) 38.2 ?C (!) 38.1 ?C (!) 38 ?C (!) 37.9 ?C TempSrc:    Bladder SpO2: 100% 100% 100% 100% Weight:     Height:     Intake/Output Summary (Last 24 hours) at 10/25/2020 2229Last data filed at 10/25/2020 2200Gross per 24 hour Intake 3974.9 ml Output 1887 ml Net 2087.9 ml Physical Exam Less Anxious looking. However he is tearful.Tolerating vent with BiPAP 12/5, Need to change to PCVCourse BSLabs Last 24 hours:Most Recent Result Component Value Date/Time  Sodium 144 10/25/2020 05:17 AM  Potassium 4.3 10/25/2020 05:17 AM  Chloride 105 10/25/2020 05:17 AM  CO2 28 10/25/2020 05:17 AM  BUN 59 (H) 10/25/2020 05:17 AM  Creatinine 2.44 (H) 10/25/2020 05:17 AM  WBC 8.6 10/25/2020 05:17 AM  Hemoglobin 6.7 (L) 10/25/2020 05:17 AM  Hematocrit 21.80 (L) 10/25/2020 05:17 AM  Platelets 98 (L) 10/25/2020 05:17 AM  INR 1.10 10/25/2020 05:17 AM  PTT 26.2 10/25/2020 05:17 AM Imaging Last 24 hours:XR Chest PA or APResult Date: 2/20/2022Tracheostomy tip is approximately 8 cm from the carina. No new consolidation or pulmonary edema. Improved aeration at the left lung base compared to prior exam. No pneumothorax. Reported And Signed By: Maryjane Hurter, MD  [facility] Radiology and Biomedical ImagingAssessment / Plan:Neurologic:  Pain is being controlled with the current regimen: Oxy+Dilaudid+ Tylenol.Anxiety being managed.with doxepin and clonazepam.Added PRN Ativan Precedex transitioned to clonidineCardiac: Heart rate: Tachycardia. Improved after placing on vent. blood pressure: >65MAP goal: >65Respiratory: Pulmonary failure:Tumor not obstructing the trach or distally.placed on vent with improvement.Radiotherapy and medical/palliative management.Plan for profol when he goes for radiation therapy. Will continue till Tuesday Wean O2 as tolerated.Continue nebs for secretion mobilization Infectious Disease/Sepsis:S/P MRSA pneumonia/ necrotic mass. On vanco and Cipro for pneumonia.Renal, Fluids/Electrolytes: AKI, Acute renal failure - Cr Stable. May be Vanco related.Follow Cr trendAppropriate urine output.Hypernatremia: improving. Continue free water flushesGI/Nutrition: Protein-calorie malnutrition - Albumin low.NPO except sips for comfort.  diarrhea: improved Continue J-tube feeds at goal.  vital AF +Banatrol.Hematology:Acute blood loss anemia.Transfuse for Hct <7Walstrom globunemia: no intervention.Plts -low. No need for transfusion. VTE prophylaxis with heparin SQ. He has RIJ thrombus. Not a candidate for full anticoagulation as he is likley to bleed from the tumor (already has blood tinged secretions)Endocrine:  Insulin protocol for hyperglycemiaMusculoskeletal:  PT/OT as able.Skin/wound: Frequent repositioning to mitigate against pressure ulcerationGoals of Care/Advance Directives:Code status: Full codePalliative care is consulted and involved in care. Ongoing goals of care discussions. Will continue current management till next tuesday  Prophylaxis:  As abovePlan discussed with:  Primary Service, Critical Care Nursing, Respiratory Therapy, PharmacyCritical Care Time: 1 minRespiratory failure: Restarted on mechanical ventilation. [name] MDSICU Attending

## 2020-10-26 NOTE — Plan of Care
Plan of Care Overview/ Patient Status    Had radiation treatment today - see note. PT is alert and oriented. Pain managed with prn oxycodone and tylenol. PT is very teary at times. Comfort provided as needed. OOB to chair with minimal assistance of two, after a few minutes sitting in chair stated feeling light headed, SBP:81/52, levo restarted, vitals returned to baseline. Currently Levo at 0.02 mcg/kg/min. Temp about 99. HR mostly 80's. Pressure control vent unchanged. ET sxn with mod, thin, whitish secretions. Tolerating tube feed via PEG with minimal residual. Ice cream provided as ordered, tolerating small amounts at a time. Foley with > 81ml/hr. No BM so far, on scheduled banatrol. Skin per flowsheet. Please see flowsheet for additional information.

## 2020-10-26 NOTE — Plan of Care
Plan of Care Overview/ Patient Status    Pt A&Ox4. Writing on whiteboard or using phone voice app for communication. PERRL MAE. Pt c/o headache and neck soreness upon coughing. Pain relieved wit Oxycodone 2.5mg . Pt appears to be less axious and is speaking about looking forward to finishing radiation and discussing my plan after. I want to go home. I am dying. Slept well overnight and played music on phone. Ativan 0.5mg  IV x1 given for anxiety per pt's request. T-max 100.8. Tylenol 650mg  given per order. HR 84-102, NSR-sinus tachycardia. MAP goal >60 maintained with Levophed @0 .02. B LS coarse. Pressure AC vent settings. Suctioned q 3hours for moderate amount of tan/creamy, thin secretions. Abd rounded and soft. Active BSx4. TF continue via PEG at goal rate. Free water flushes q8hours continue for hypernatremia. NO bM at this time. Foley catheter with cloudy, yellow output 30-165ml/hr. See skin documentation for skin details. Pt's sister updated over the phone. All interventions and POC explained to pt. See flowsheets for further information.

## 2020-10-26 NOTE — Progress Notes
Inpatient Diabetes Management Team Follow-up Note    Over the past 24 hours, the patient's glucose control has been variable but close to goal     ? Current Nutritional Status:  Vital AF 1.2 @ 95 cc/hr   ? Current Anti-hyperglycemic Regimen:  glargine 20 unit q noon, regular insulin 26 units q 6 hours, regular insulin MID dose sliding scale q 6 hours  ?  Home Anti-hyperglycemic Regimen: Toujeo U300 64 units daily, Apidra 20 units TID with meals, metformin 1000 mg bid   ? Other Contributing Medications: Levophed infusion     PE:    Patient laying in bed in NAD  Temp:  [99.68 ?F (37.6 ?C)-100.76 ?F (38.2 ?C)] 99.68 ?F (37.6 ?C)  Pulse:  [60-127] 92  Resp:  [0-35] 18  BP: (73-170)/(36-129) 107/53  SpO2:  [90 %-100 %] 100 %  Device (Oxygen Therapy): mechanical ventilator     Wt Readings from Last 3 Encounters:   10/26/20 (!) 145 kg   10/06/20 (!) 139.5 kg   09/29/20 (!) 142.4 kg       Data Review:  BGs:      Creatinine (mg/dL)   Date Value   16/06/9603 2.51 (H)     Hemoglobin A1c (%)   Date Value   10/10/2020 9.0 (H)       Assessment:  55 yo h/o type 2 diabetes, HTN, Waldenstroms macroglobulinemia, ETOH, laryngeal SCC s/p trach/PEG presented with SOB significant for tumor invading the trach stoma with partial obstruction.  Diabetes team consulted for assistance in glycemic management.      BG over the past 24 hour have been above goal. Advise increase in glargine and transition to twice a day dosing so doses can be titrated more frequently and one dose can be stopped if tube feedings are ever on hold, which will minimize risk of hypoglycemia. Also advise increase in bolus insulin.  Discussed plan with the covering provider, N. Hanury, PA.  Given patient on continuous tube feedings, target blood glucose between 140mg /dl-180mg /dL.  If bolus regular insulin is administered and TFs are held patient is at risk of developing hypoglycemia. Please check BG q1 hour x6 hours and start IV fluids with D5 if BG < 100 mg/dL    Recommendations:   1. Discontinue glargine 20 units q noon  2. Start glargine 10 units qHS  3. Start glargine 15 units qAM tomorrow 2/22  4. Increase regular insulin to 28 units q 6 hours -- HOLD if tube feeds stopped or discontinued   5. Continue regular insulin mid dose sliding scale q 6 hours   6. Continue blood glucose monitoring q 6 hours   7. Diet: tube feeds per nutrition       Please let us know if you have any questions or concerns about our recommenations. We will continue to follow the patient along with you during his hospitalzation as we attempt to optimize his glycemic control.  Detailed recommendations were communicated to the primary team both verbally and/or through this written note.  I spent a total of 20 minutes with the patient of which 15 minutes (over 50%) was spent in counseling the patient regarding diabetes management and coordinating care with the primary team.     Signed:  Everlene Farrier, APRN  Inpatient Diabetes Management Team   [facility]: [phone]  Diabetes consult pager # [phone]      NOTE: During nights, weekends, and holidays, please contact the on-call Endocrine Fellow at pager [phone].

## 2020-10-26 NOTE — Progress Notes
[facility]	SICU Progress NoteAttending Provider: Alric Quan, MDPost-Operative Day/Post Injury Day Procedure(s):2/5: [name] exchange (ENT) Hospital LOS: 17 days Interim History:  HPI: 55 y.o. male admitted to the SICU on 2/9 for ventilator management following an RRT on the oncology floor for desaturation and increased work of breathing while on trach mask and ultimately placed back on vent. ?Patient was initially a transfer from [facility] on 2/4 after trach and PEG when he had a partial obstruction of trach site from a new necrotic mass. He underwent trach exchange 2/5. Was on trach mask on oncology floor however had 2 episodes on floor requiring RRT for lavage and likely mucous plugging. ?Course Complicated by: - Incidental R IJ thrombus - Multiple episodes of presumed mucous plugging requiring RRT's ?PMH: Chronic ITP, Waldenstrom macroglobulinemia, HTN. HLD, DM, tobacco and alcohol use, Laryngeal SCC (weekly Cisplatin and RT)?PSH: None known ?Home meds: Lipitor, Pepcid, Lisinopril, Metformin, Oxycodone, miralax, Insulin U100 and Lantus SICU course:2/9: Arrives to SICU HDS, on vent PCV. Appears well. Transitioned to Pressure support on vent and tolerating well. O/N: Episode of patient feeling like not getting a full breath, extremely tachypenic and increased work of breathing and tachycardia, attempted suctioning and lavage without improvement, attempted to switch to PCV and VAC but due to dyssynchrony unable to tolerate, no desat but poor ventilation and TV and ETCO2 up to 100. ENT at bedside do not feel additional scope would benefit as he was just scoped a few hours ago, ultimately gave fentanyl push and started propofol for vent synchrony but despite max prop still dyssynchronous. Gave one dose of roccuronium once sedated for vent synchrony with good effect. Albuterol and HTS neb given and peaks improved from 50 to 30's. No further paralytic needed and now synchronous on VAC. Kept on propofol for vent synchrony. CXR reassuring and back to GCS 11T. Hypotension only following sedation and Rocc, Low dose Neo started. 2/10: Kept on higher sedation in AM for [place] A/P (for staging) and soft tissue neck. Weaning sedation and will plan for PSV trial once more awake. Scoped by ENT in AM, airway patent. Q1-2hr suction requirements of thick tan secretions. No further episodes of respiratory distress although sedation remains high. Tube feeds changed to continuous given significant hyperglycemia. Insulin regimen changed per diabetes team recommendations. Pressure ulceration noted around tracheostomy --> ENT at bedside to evaluation, likely some component of invasive tumor growth. Palliative care consult for assist with symptom management and family moving forward. Primary oncologist office made aware of referral. O/N: weaning down on Prop, no episodes of respiratory distress or desats overnight. Febrile to 101.3, sputum and blood cultures ordered. 500cc LR bolus given for some oliguria and rising Cr. 2/11: Given 500cc NS for oliguria with good response. Vanco level yesterday 25, likely cause of worsening aki, on hold. UOP & lytes remains stable. Insulin adjusted per diabetes team. PSV as tolerated (doing well). CPT added. Confirmed with ENT pulm recs for blue portex suctionaid trach- no plans to switch at this time. Bowel regimen added. Plan keep on neo if low dose but if inc, consider changing to levo as likely then not in setting of sedation. Family updated on phone. Confirmed w heme- no AC rec for R IJ thrombus until plt > 50. Was going to attempt to place pt on TM overnight as he did very well on PSV x 3hrs, however, pt had anxiety attack regarding his cancer and prognosis, he requested to sleep during night and was agreeable to TM in  am. O/N: Patient agrees to PSV overnight which he did very well on. Agreeable to getting OOB to chair and possibly trach mask in AM but wants to stay on vent tonight. Off Neo by AM.  2/12: trial trach mask as tolerated this AM. Adjusted insulin regimen per the diabetes management service. Sputum now with staph, awaiting sensitivities. More loose stools today; nursing placed rectal tube and held bowel regimen.  ON: Anxious at times, feeling like he cannot breath but doing well on TM. Frequent suctioning. Increased standing. FIO2 increased to 40%.  insulin to 14U per endo reccs. 2/13: continue to adjust insulin regimen, now 20u regular q6. Cr continues to uptrend 2.57 --> 2.63 and repeat vanco level still elevated 24.6; pharmacy to continue dosing adjustments. Otherwise, remains on trach mask. Sputum with MRSA --> d/c ceftriaxone. ON: increased standing insulin to 24U. 2/14: Will discuss [facility] plan with hematology now that PLT > 50 x2 checks. Will also d/c vancomycin today (10d of therapy thus far). AKI appears to have peaked at 2.57 -> 2.63 -> 2.47. Awaiting transfer to the oncology service on NP 15. Okay to start [facility], but will hold off today given more bloody secretions. 2/15: Plan to start palliative RT today. AM session aborted due to extreme anxiety (despite premedication with IV ativan), tried again in the PM with 10mg  IV Valium + 3mg  IV ativan, procedure again aborted due to extreme anxiety and inability to tolerate. Pending discussion with rad onc in AM re: utility of ongoing attempts at RT. Pending transfer to NP15 on med onc service.  O/N: increasingly anxious / restless, but able to re-direct and calm down with PRN ativan. Liquid stools, placed rectal tube. Will start banatrol and check CBC in AM, check c dif. This had previously improved by holding bowel regimen.2/16: Acutely increased WOB, HR 130s, SBP 170s --> ENT bedside scope with trach patent, though notable secretions --> suctioned with minimal improvement; placed back on ventilator PSV 10 / +5 with immediate improvement in VS and respiratory distress. Precedex gtt started to assist more frequent suctioning. CXR with LLL consolidation --> resend sputum, and restart Vanco (close trough monitoring with AKI) given known MRSA PNA. Increased diarrhea, cdiff negative, transition to Vital TF. LR 500cc for hemoconcentration of labs, tachycardia. Appreciate radiation oncology; sedated patient with Propofol gtt, bolus x1 with good effect --> able to tolerate 15 minute radiation session; plan to repeat Thursday, Friday, Monday, and Tuesday. 2/17: Psych consulted for depression--> Doxepin added, Klonopin added. Hypernatremia--> D5W and free water flushes added. Ceftaz added for GNR in sputum. Plan for family discussion this PM (see separate note) and radiation this PM. Foley replaced for retention. ON: Tolerated PSV 15/5. Repeat Na 148.2/18: Plan for radiation this afternoon. Rectal tube removed.. Hgb 6.3--> transfused 1un PRBCs. Discussed care with sister and niece at bedside, remain full code and plan to continue radiation course through Tuesday (see family communication note).2/19: PSV weaned 15 --> 12/+5. Start Clonidine and discontinue Precedex gtt. Discontinue Gabapentin and Dilaudid IV, decrease Oxycodone to 2.5 / 5. Na improved 144, discontinue D5W; sugars should improve, maintain current regimen per DM team. GNR per micro lab not pseudomonas, klebsiella, or other enteric organism; would have speciated --> change Ceftaz to PO Cipro, to complete course 2/23 for one week. Cr again increased with Vanco level over 20 --> next dose held, monitor level closely. Hgb remains low despite 1U yesterday, 6.3 --> 7.0; start multivitamins and check with Heme. ON: Low grade fevers stable. Given ativan  0.5mg  in place of klonipin with good anxiolytic effect. Transfuse 1u PRBC (likely half unit) for Hgb 6.7.2/20: Acute respiratory distress this morning after turn, minimal secretions, did not improve with increased pressure or VAC --> bagged x5 mins with improvement, placed on PCV, IP 20 --> CXR pending, start Propofol, add back Ativan and Morphine PRN. Appreciate heme assistance with persistent anemia. Cr continues to rise following elevated vanco trough; hold any further doses, level will likely remain therapeutic to final date of course (2/22) --> unfortunately, patient did receive additional dose of Vanco 2g per pharmacy order this morning; discontinued placeholder, consider Doxycycline if Vanco level subtherapeutic prior to end date.ON: Remains on PCV. Remained on low dose levo 0.02 throughout night. Manual suctioning by RT overnight with thin tan secretions. AM labs notable for Hgb 9.1(6.7) after 1uPRBC transfusion yesterday. Worsening AKI Cr 2.51 (2.44).2/21: Patient alert and breathing comfortably this morning --> wean PEEP back to 5, consider retrial of PSV today pending radiation plans. Free water flushes discontinued, hypernatremia resolved. Worsening AKI from Vanco; since discontinued, monitor UOP. LR 250cc, wean off Levo gtt. Appreciate palliative care assistance after event yesterday, patient requesting further discussion. Lantus changed to 15U QHS, 10U QAM and bolus increased to 28U Q6.Review of Allergies/Meds/Hx: I have reviewed the patient's: allergies, past medical history, past surgical history, family history, social history, prior to admission medicationsObjective: Vitals:I have reviewed the patient's current vital signs as documented in the EMRTemp:  [99.68 ?F (37.6 ?C)-100.76 ?F (38.2 ?C)] 100.04 ?F (37.8 ?C)Pulse:  [60-127] 93Resp:  [0-35] 18BP: (73-170)/(36-129) 117/58SpO2:  [90 %-100 %] 100 %SpO2: 100 %Device (Oxygen Therapy): mechanical ventilatorI/O's:I have reviewed the patient's current I&O's as documented in the EMR.Gross Totals (Last 24 hours) at 10/26/2020 0603Last data filed at 10/26/2020 0400Intake 3802.4 ml Output 1725 ml Net 2077.4 ml Physical ExamConstitutional: General: Anxious gentleman appearing older than stated age. HENT:    Head: Normocephalic and atraumatic.    Mouth: Mucous membranes are moist.    Extraocular Movements: Extraocular movements intact.    Pupils: Pupils are equal, round, and reactive to light.    Comments: Tracheostomy in placeCardiovascular:    Rate and Rhythm: Regular rhythm. Regular rate   Heart sounds: No murmur heard. Pulmonary:    Breath sounds: No wheezing, rhonchi or rales.    Comments: Ventilated via trachAbdominal:    General: There is no distension.    Tenderness: There is no abdominal tenderness.    Comments: PEG tube in place with TFs runningGenitourinary:   Comments: Foley in place draining clear, yellow urineMusculoskeletal:    Lower legs: Mild edema   Comments: Moves all extremities equally, to command.Skin:   General: Skin is warm and dry.    Capillary Refill: Capillary refill takes less than 2 seconds. Neurological:    General: No focal deficit present.    Mental Status: He is alert.    Cranial Nerves: No cranial nerve deficit.    Motor: No weakness. Labs: Refer to EMR. I have reviewed the patient's labs within the last 24 hours; see assessment and plan below for significant abnormals.Microbiology:Refer to EMR. I have reviewed all new results within the last 24 hours; see assessment and plan below.Diagnostics:Refer to EMR. I have reviewed all new results within the last 24 hours; see assessment and plan below.Chest ZO:XWRUE to EMR. I have reviewed all new results within the last 24 hours; see assessment and plan below.Abdominal AV:WUJWJ to EMR. I have reviewed all new results within the last 24 hours; see assessment and  plan below.ECG/Tele Events: I have reviewed the patient's ECG and telemetry as resulted in the EMR.Assessment/Plan Neurologic: Analgesia- Tylenol 650 Q6 PRN - Oxycodone 2.5 / 5mg  Q4 PRN - Morphine 2mg  Q4 PRN breakthroughAnxiety- Clonidine taper s/p Precedex gtt- Klonopin 0.5mg  BID- Ativan 0.5mg  IV Q6 PRN acute agitation- Doxepin 10mg  QHS- Appreciate palliative care and psych consults?ENT: L laryngeal SCC, invading tracheostomy: s/p trach exchange 10/10/20 and biopsy 10/12/20- Bivona trach to bypass obstruction - Unfortunately, there are no surgical / curative options- Palliative radiation x5 days (2/16, 2/17, 2/18, 2/21, 2/22); requires VAC with full propofol sedation?Cardiac: Hypotension- Sedation related following acute event yesterday, with propfol, fentanyl, clonidine, morphine, ativan --> limit today as able - Minimal Levo gtt --> wean off, LR 250cc nowHx of HTN / HLD- Hold home lisinopril, lasix, crestor given AKI?Respiratory:  Respiratory failure- Due to tumor invasion of tracheal stoma; though trach is patent, tumor appears circumferential; also complicated by high secretion burden and frequent mucous plugging- Multiple events this hospitalization of respiratory distress due to trach occlusion- PCV: IP 20, PEEP decrease to +5 --> retrial PSV 15/+5 if stable- Albuterol Q6?GI: Diarrhea- PEG prior to admission- TF to Vital @ 95- Okay for ice cream, sips and chips- Banatrol TID?Renal: AKI on CKD (baseline Cr 1.5-1.6), 2/2 supratherapeutic vanco- Cr peaked 2.6 --> uptrending again with vanco level ~20, similar to last event; Cr 2.5- Foley replaced 2/17 for urinary retention?Infectious Disease: MRSA PNA; resolved- S/p Vanco (2/5 - 2/14, 2/16 - 2/22) - If need to restart, would use Doxycycline4+ GNR PNA- Per Micro, cannot speciate, but confirmed no enteric organism, pseudomonas, klebsiella, ecoli- Ceftaz (2/17- 2/19), change to Cipro (2/19 - 2/23)?Hematology:Critical illness anemia- Sanguinous secretions, expected given tumor burden and coagulopathy, may worsen with radiation and baseline hematologic disease- Multivitamins and cofactors- Maintain Hgb >7?Hx of chronic ITP, Waldenstrom macroglobulinemia- Transfuse platelets <50 if bleeding, <10 if no active bleeding- Appreciate hematology recommendations?R IJ thrombus - High risk heparin gtt when plt consistently >50- Hep SC - Maintain SCDs at all times?Endocrine: Hx T2DM- Lantus 10U Qam, 15U Qhs- Regular insulin 28u Q6- Regular MDISS- HOLD home Metformin, Lantus 64u, Apidra 20u TID w meals- Appreciate diabetes team recsMusculoskeletal: Generalized deconditioning- OOB daily, PT/OT evals- Turn and reposition Q2 hours, PUP dressing?Device Review:- R IJ Port (08/12/20) per guidelines, nonocclusive DVT does not require removal of line- PEG (09/14/20)- Bivona trach (10/10/20)?Code Status / Dispo:- Full code - Per ENT, patient to transfer to medical oncology after SICU- Appreciate palliative care assistance with GOC discussionsNotifications : Please call SICU Covering Provider assigned to patient via Epic EMR (primary) [facility] Dynamic Roles (secondary)[facility] 7-1 APP		[phone]:Rebbie Lauricella, PA-CSICU APP2/21/2022

## 2020-10-26 NOTE — Progress Notes
Otolaryngology Progress NoteLenwood [name] is a 55 y.o. 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, trach/PEG transferred from [facility] due to SOB w/exam significant for tumor invading the trach stoma and scope showing partial obstruction.Interim History- accepted to med onc, awaiting bed in MICU - episodic trach obstructions 2/2 tumor/secretionsVitalsTemp:  [37.4 ?C-38.2 ?C] 37.7 ?CPulse:  [82-104] 102Resp:  [0-26] 19BP: (85-138)/(45-84) 112/60SpO2:  [96 %-100 %] 97 %Device (Oxygen Therapy): mechanical ventilatorI/O last 3 completed shifts:In: 3997.7 [P.O.:120; I.V.:124.3; Blood:350; NG/GT:2970; IV Piggyback:433.4]Out: 2020 [Urine:2020]Physical ExamGen: alert, NADEyes: open, normal external appearanceFace: grossly symmetricNose: clear anteriorlyOP/OC: hemostaticNeck: Bivona 8 cuffed secured w/soft trach ties, necrotic but hemostatic mass surrounding the stoma, remaining neck flat, firm tumor Pulm: no increased WOB on TMRecent Labs Lab 02/20/220517 02/20/220517 02/21/220529 WBC 8.6   < > 8.9 HGB 6.7*   < > 9.1* HCT 21.80*   < > 29.40* MCV 90.5   < > 89.1 PLT 98*  --  137*  < > = values in this interval not displayed. Recent Labs Lab 02/20/220517 02/20/220636 02/21/220529 02/21/220532 02/21/221136 NA 144   < > 142  --   --  K 4.3   < > 4.8  --   --  CL 105   < > 102  --   --  CO2 28   < > 26  --   --  BUN 59*   < > 60*  --   --  CREATININE 2.44*   < > 2.51*  --   --  GLU 113*   < > 219*   < > 163* CALCIUM 9.0   < > 9.4  --   --  MG 2.1   < > 2.4  --   --  PHOS 4.5  --  3.2  --   --   < > = values in this interval not displayed. Recent Labs Lab 02/15/220606 02/15/220606 02/20/220517 PTT 24.2   < > 26.2 INR 1.00  --  1.10  < > = values in this interval not displayed. 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 Admitted to SICU for ventilatory support. No acute ENT intervention planned; no curative or meaningful surgical options at this time. Course complicated by multiple episodes of Bivona trach occlusion/obstruction.  - no ENT intervention planned at this time; case discussed at tumor board 2/14 and is not a surgical candidate - disease management/goals-of-care best pursued via non-surgical means; eg, Medical Oncology, Radiation Oncology, Palliative Care- consider transfer to MICU, if available - continue routine trach care with careful suctioning- ensure trach well secured with Karn Cassis, MDPGY-1, Anesthesiology

## 2020-10-27 ENCOUNTER — Ambulatory Visit: Admit: 2020-10-27 | Payer: PRIVATE HEALTH INSURANCE

## 2020-10-27 DIAGNOSIS — J9601 Acute respiratory failure with hypoxia: Secondary | ICD-10-CM

## 2020-10-27 LAB — CBC WITH AUTO DIFFERENTIAL
BKR WAM ABSOLUTE IMMATURE GRANULOCYTES.: 0.13 x 1000/ÂµL (ref 0.00–0.30)
BKR WAM ABSOLUTE LYMPHOCYTE COUNT.: 0.16 x 1000/ÂµL — ABNORMAL LOW (ref 0.60–3.70)
BKR WAM ABSOLUTE NRBC (2 DEC): 0 x 1000/ÂµL (ref 0.00–1.00)
BKR WAM ANALYZER ANC: 6.73 x 1000/ÂµL (ref 2.00–7.60)
BKR WAM BASOPHIL ABSOLUTE COUNT.: 0.01 x 1000/ÂµL (ref 0.00–1.00)
BKR WAM BASOPHILS: 0.1 % (ref 0.0–1.4)
BKR WAM EOSINOPHIL ABSOLUTE COUNT.: 0.01 x 1000/ÂµL (ref 0.00–1.00)
BKR WAM EOSINOPHILS: 0.1 % (ref 0.0–5.0)
BKR WAM HEMATOCRIT (2 DEC): 22.6 % — ABNORMAL LOW (ref 38.50–50.00)
BKR WAM HEMOGLOBIN: 6.9 g/dL — ABNORMAL LOW (ref 13.2–17.1)
BKR WAM IMMATURE GRANULOCYTES: 1.8 % — ABNORMAL HIGH (ref 0.0–1.0)
BKR WAM LYMPHOCYTES: 2.2 % — ABNORMAL LOW (ref 17.0–50.0)
BKR WAM MCH (PG): 27.3 pg (ref 27.0–33.0)
BKR WAM MCHC: 30.5 g/dL — ABNORMAL LOW (ref 31.0–36.0)
BKR WAM MCV: 89.3 fL (ref 80.0–100.0)
BKR WAM MONOCYTE ABSOLUTE COUNT.: 0.27 x 1000/ÂµL (ref 0.00–1.00)
BKR WAM MONOCYTES: 3.7 % — ABNORMAL LOW (ref 4.0–12.0)
BKR WAM MPV: 11.6 fL (ref 8.0–12.0)
BKR WAM NEUTROPHILS: 92.1 % — ABNORMAL HIGH (ref 39.0–72.0)
BKR WAM NUCLEATED RED BLOOD CELLS: 0 % (ref 0.0–1.0)
BKR WAM PLATELETS: 144 x1000/ÂµL — ABNORMAL LOW (ref 150–420)
BKR WAM RDW-CV: 18.1 % — ABNORMAL HIGH (ref 11.0–15.0)
BKR WAM RED BLOOD CELL COUNT.: 2.53 M/ÂµL — ABNORMAL LOW (ref 4.00–6.00)
BKR WAM WHITE BLOOD CELL COUNT: 7.3 x1000/ÂµL (ref 4.0–11.0)

## 2020-10-27 LAB — BASIC METABOLIC PANEL
BKR ANION GAP: 14 (ref 7–17)
BKR BLOOD UREA NITROGEN: 60 mg/dL — ABNORMAL HIGH (ref 6–20)
BKR BUN / CREAT RATIO: 24.9 — ABNORMAL HIGH (ref 8.0–23.0)
BKR CALCIUM: 8.7 mg/dL — ABNORMAL LOW (ref 8.8–10.2)
BKR CHLORIDE: 101 mmol/L (ref 98–107)
BKR CO2: 24 mmol/L (ref 20–30)
BKR CREATININE: 2.41 mg/dL — ABNORMAL HIGH (ref 0.40–1.30)
BKR EGFR (AFR AMER): 34 mL/min/{1.73_m2} (ref >60)
BKR EGFR (NON AFRICAN AMERICAN): 28 mL/min/{1.73_m2} (ref >60)
BKR GLUCOSE: 377 mg/dL — ABNORMAL HIGH (ref 70–100)
BKR POTASSIUM: 5.7 mmol/L — ABNORMAL HIGH (ref 3.3–5.3)
BKR SODIUM: 139 mmol/L (ref 136–144)

## 2020-10-27 LAB — MAGNESIUM: BKR MAGNESIUM: 2.1 mg/dL (ref 1.7–2.4)

## 2020-10-27 LAB — PHOSPHORUS     (BH GH L LMW YH): BKR PHOSPHORUS: 3.4 mg/dL (ref 2.2–4.5)

## 2020-10-27 LAB — VANCOMYCIN, RANDOM     (BH GH LMW YH): BKR VANCOMYCIN RANDOM: 16.9 ug/mL — ABNORMAL HIGH

## 2020-10-27 MED ORDER — INSULIN U-100 REGULAR HUMAN 100 UNIT/ML INJECTION SOLUTION
100 unit/mL | Freq: Once | INTRAVENOUS | Status: CP
Start: 2020-10-27 — End: ?
  Administered 2020-10-27: 10:00:00 100 mL via INTRAVENOUS

## 2020-10-27 MED ORDER — INSULIN BOLUS FROM BAG
Freq: Once | INTRAVENOUS | Status: CP
Start: 2020-10-27 — End: ?
  Administered 2020-10-27: 14:00:00 1.000 mL via INTRAVENOUS

## 2020-10-27 MED ORDER — DEXTROSE 50 % IN WATER (D50W) INTRAVENOUS SOLUTION
Freq: Once | INTRAVENOUS | Status: CP
Start: 2020-10-27 — End: ?
  Administered 2020-10-27: 10:00:00 50.000 mL via INTRAVENOUS

## 2020-10-27 MED ORDER — INSULIN REGULAR 100 UNIT/100 ML (1 UNIT/ML) IN 0.9 % NACL IV SOLUTION
100 unit/ mL (1 unit/mL) | INTRAVENOUS | Status: DC
Start: 2020-10-27 — End: 2020-11-01
  Administered 2020-10-27 – 2020-11-01 (×8): 100 mL/h via INTRAVENOUS

## 2020-10-27 MED ORDER — HEPARIN (PORCINE) 5,000 UNIT/ML INJECTION SOLUTION
5000 unit/mL | Freq: Three times a day (TID) | SUBCUTANEOUS | Status: DC
Start: 2020-10-27 — End: 2020-11-09
  Administered 2020-10-27 – 2020-11-09 (×29): via SUBCUTANEOUS

## 2020-10-27 MED ORDER — SODIUM CHLORIDE 0.9 % (FLUSH) INJECTION SYRINGE
0.9 % | INTRAVENOUS | Status: DC | PRN
Start: 2020-10-27 — End: 2020-10-27

## 2020-10-27 MED ORDER — NOREPINEPHRINE BITARTRATE 16 MG/250 ML (64 MCG/ML) IN 0.9 % NACL IV
16 mg/250 mL (64 mcg/mL) | INTRAVENOUS | Status: DC
Start: 2020-10-27 — End: 2020-10-27
  Administered 2020-10-27: 06:00:00 16 mL/h via INTRAVENOUS

## 2020-10-27 MED ORDER — DEXAMETHASONE 4 MG TABLET
4 mg | Freq: Two times a day (BID) | JEJUNOSTOMY | Status: DC
Start: 2020-10-27 — End: 2020-10-29
  Administered 2020-10-27 – 2020-10-29 (×5): 4 mg via JEJUNOSTOMY

## 2020-10-27 MED ORDER — CALCIUM GLUCONATE 1GM MINI-BAG PLUS
Freq: Once | INTRAVENOUS | Status: CP
Start: 2020-10-27 — End: ?
  Administered 2020-10-27: 11:00:00 100.000 mL/h via INTRAVENOUS

## 2020-10-27 MED ORDER — DEXTROSE 50 % IN WATER (D50W) INTRAVENOUS SOLUTION
INTRAVENOUS | Status: DC | PRN
Start: 2020-10-27 — End: 2020-11-01

## 2020-10-27 MED ORDER — DEXTROSE 50 % IN WATER (D50W) INTRAVENOUS SOLUTION
INTRAVENOUS | Status: DC | PRN
Start: 2020-10-27 — End: 2020-10-28

## 2020-10-27 MED ORDER — INSULIN GLARGINE 100 UNIT/ML SUBCUTANEOUS SOLUTION
100 unit/mL | Freq: Every evening | SUBCUTANEOUS | Status: DC
Start: 2020-10-27 — End: 2020-10-30
  Administered 2020-10-28 – 2020-10-30 (×3): 100 mL via SUBCUTANEOUS

## 2020-10-27 MED ORDER — SODIUM CHLORIDE 0.9 % (FLUSH) INJECTION SYRINGE
0.9 % | Freq: Three times a day (TID) | INTRAVENOUS | Status: DC
Start: 2020-10-27 — End: 2020-10-27

## 2020-10-27 MED ORDER — INSULIN GLARGINE 100 UNIT/ML SUBCUTANEOUS SOLUTION
100 unit/mL | Freq: Every day | SUBCUTANEOUS | Status: DC
Start: 2020-10-27 — End: 2020-10-30
  Administered 2020-10-27 – 2020-10-30 (×4): 100 mL via SUBCUTANEOUS

## 2020-10-27 MED ORDER — GLYCOPYRROLATE 0.2 MG/ML INJECTION SOLUTION
0.2 mg/mL | Freq: Three times a day (TID) | INTRAVENOUS | Status: DC
Start: 2020-10-27 — End: 2020-10-27

## 2020-10-27 MED ORDER — CALCIUM GLUCONATE INJECTION
Freq: Once | INTRAVENOUS | Status: DC
Start: 2020-10-27 — End: 2020-10-27

## 2020-10-27 NOTE — Progress Notes
Otolaryngology Progress NoteDate: 10/27/2020 Interim History- episodic desaturations; scoped overnight with Bivona repositioned- weaning propofol - continues TF via G-tubeVitalsTemp:  [36.6 ?C-38.3 ?C] 36.6 ?CPulse:  [72-118] 73Resp:  [0-24] 18BP: (81-127)/(45-99) 100/53SpO2:  [95 %-100 %] 96 %Device (Oxygen Therapy): mechanical ventilatorI/O last 3 completed shifts:In: 3552.6 [P.O.:390; I.V.:842.6; NG/GT:2270; IV Piggyback:50]Out: 2195 [Urine:2195]Physical ExamGen: alert, NADEyes: open, normal external appearanceFace: grossly symmetricNose: clear anteriorlyOP/OC: hemostaticNeck: Bivona 8 cuffed secured w/soft trach ties, necrotic but hemostatic mass surrounding the stoma, remaining neck flat, firm tumor Pulm: no increased WOB on TMDiagnostic DataReviewed, per chartAssessment & Plan54 M with PMH of HTN, HLD, DM, tobacco and EtOH use, Waldenstrom macroglobulinemia, 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 Admitted to SICU for ventilatory support. No acute ENT intervention planned; no curative or meaningful surgical options at this time. Course complicated by multiple episodes of Bivona trach occlusion/obstruction.  - no ENT intervention planned at this time; case discussed at tumor board 2/14 and is not a surgical candidate - disease management/goals-of-care best pursued via non-surgical means; eg, Medical Oncology, Radiation Oncology, Palliative Care- agree with dexamethasone 8 mg bid per Medical Oncology for peritumoral inflammation- consider transfer to MICU when available - continue routine trach care with careful suctioning- ensure trach well secured with Karn Cassis, MDPGY-1, Anesthesiology

## 2020-10-27 NOTE — Progress Notes
Due to the complex nature of this patient's critical illness I have seen and evaluated this patient and formulated the following plan in addition to the advanced practice provider's care. I have seen and evaluated [facility] and agree with the findings and plan as documented in the APP's note with the following changes. I have also reviewed all the pertinent labs and imaging.CV: hemodynamics: back on low dose pressors synchronous with increased propofol.Pressors: levophedArrhythmia: no			Antihypertensives:noShock: nonePulmonary:Continues to have episodes related to obstruction of the tracheostomy device, already switched for longer Bivona already. Last night's episode related to displacement of Bivona externally with tracheal end compressed by tumor. Other episodes related to sloughed tumor and tumor secretions impacting the end of the tubeComfortable on pressure control settings but slowly requiring additional pressure support.Continue nebulizersRegular suctioning. GI/Nutrition:Tube feeds: via G tubeContinue Vital AFNo bm x 2 days- d/c BanatrolProtein-calorie malnutrition: yesRenal:AKI: yesMaintain foley for now while on sedation/pressors/RT treatmentElectrolyte abnormalities: hyperkalmia correctedEndocrine:Glycemic control goal 120-180 now on Lantus and insulin drip, Diabetes following, mildly worse due to dextrose given with insulin for hyperkalemia.Dexamethasone initiated to mitigate side effects of radiationHeme:Acute blood loss anemia without ongoing bleeding. Transfused this am again for anemia with mild hypotensionPlatelets now higherKnown IJ thrombus related to device, no plan for anticoagulation due to high risk of bleeding from tumor and due to ITP.Red cell production augmented with cofactors.DVT ppx: sq heparinID:Abx: final dayCompleted course for MRSA pneumoniaSepsis: noNeuro:Sedation: propofol for discomfort related to obstructive symptoms from the tracheostomy, overall pain and radiation treatmentPain control:Brain compression: No		Traumatic Brain Injury: NoDelirium: noPt intermittently clearly emotionally distressed due to his poor prognosis, stress of radiation. Continue Doxepin, propofol, PRN ativanMSK/skin:Dispo: No surgical options, plan for now is to continue radiation and palliative treatment. Oncology discussed with him potential for chemotherapy if he recovers to the point of ventilator liberation and hospital discharge, pt very encouraged by this despite frequent airway obstruction events, ventilator dependence and significant tumor burden. Given this plan care will be turned over to the medical ICU and the medical oncology service. DVT ppx: sq heparin		GI ppx: PPIFamily present at bedside: noLucy Holten Spano, MDTrauma, Surgical Critical Care, and Acute Care Surgery2/22/2022This patient is critically ill as indicated by the following Acute Respiratory Failure and End of life careThe patient has required 40 minutes of my undivided attention during the past 24 hours for one of the previous mentioned life threatening diagnoses. The critical care interventions have included: Management of Acute Respiratory Failure with ongoing ventilator and airway management including suctioning and Support of intensive care management with consulting services including Palliative Care and Oncology, ENT, end of life care.

## 2020-10-27 NOTE — Plan of Care
Plan of Care Overview/ Patient Status    Pt anxious/agitated at beginning of shift in respiratory distress and stating I can't breathe, providers at bedside, pt desaturated, bagged and deep suctioned with 10 Fr catheter per provider; scoped per ENT, Bivona trach noted to be slightly retracted, repositioned per ENT. Ativan x1 given for anxiety. Propofol restarted for sedation, weaning as pt tolerates. Decadron Q12H started. Oxycodone and Tylenol given for pain. NSR on Tele, HR 70s-80s. Levo titrated to maintain MAP goal > 60. T-Max: 100.6. Remains on PCV, 30% FiO2, lungs coarse, in-line suctioned Q2H for small to moderate tan-blood tinged secretions. Tolerating TF @ goal via PEG, abdomen soft, non-tender, +BS, -BM. Foley indwelling with adequate cloudy-yellow UO. Vanco level drawn. Potassium 5.7, insulin/dextrose/calcium given. Hgb 6.9, 1 U RBCs transfusing. T&P Q2H. See MAR and flowsheets for further info.

## 2020-10-27 NOTE — Progress Notes
Inpatient Diabetes Management Team Follow-up Note    Over the past 24 hours, the patient's glucose control has been above goal with BG in the 400s this morning    ? Current Nutritional Status:  Continuous tube feeding with Vital AF 1.2 @ 95 cc/hr PLUS diet of ice chips and ice cream  ? Current Anti-hyperglycemic Regimen:  glargine 10 units qHS, glargine 15 units qAM, regular insulin 28 units q 6 hours, regular insulin MID dose sliding scale q 6 hours  ?  Home Anti-hyperglycemic Regimen: Toujeo U300 64 units daily, Apidra 20 units TID with meals, metformin 1000 mg bid   ? Other Contributing Medications:   ? Dexamethasone 8 mg q12 hours starting the evening of 2/2  ? Levophed infusion discontinued at 1 AM    PE:    Patient laying in bed in NAD  Temp:  [97.88 ?F (36.6 ?C)-100.94 ?F (38.3 ?C)] 97.88 ?F (36.6 ?C)  Pulse:  [72-118] 73  Resp:  [0-24] 18  BP: (81-127)/(45-99) 100/53  SpO2:  [95 %-100 %] 96 %  Device (Oxygen Therapy): mechanical ventilator     Wt Readings from Last 3 Encounters:   10/27/20 (!) 146.5 kg   10/06/20 (!) 139.5 kg   09/29/20 (!) 142.4 kg       Data Review:  BGs:      Creatinine (mg/dL)   Date Value   16/06/9603 2.41 (H)     Hemoglobin A1c (%)   Date Value   10/10/2020 9.0 (H)       Assessment:  55 yo h/o type 2 diabetes, HTN, Waldenstroms macroglobulinemia, ETOH, laryngeal SCC s/p trach/PEG presented with SOB significant for tumor invading the trach stoma with partial obstruction.  Diabetes team consulted for assistance in glycemic management.      BG over the past 24 hour have been above goal with BG this morning in the 400s. Patient started on dexamethasone 8 mg q12 hours to help with inflammation of tracheostomy obstruction. Patient remains on continuous tube feeding at goal rate and also was cleared to eat ice cream. Noted he received dextrose and insulin for treatment of hyperkalemia this morning. Advise starting an insulin infusion given severe hyperglycemia. Glargine can still be administered to keep insulin gtt rates down. Discussed plan with the covering provider, N. Ciccia, PA.  Given patient on continuous tube feedings, target blood glucose between 140mg /dl-180mg /dL.  If bolus regular insulin is administered and TFs are held patient is at risk of developing hypoglycemia. Please check BG q1 hour x6 hours and start IV fluids with D5 if BG < 100 mg/dL    Recommendations:   1. Start an insulin infusion  2. Increase glargine to 20 units qAM  3. Increase glargine to 20 units qHS  4. HOLD regular insulin 28 units q 6 hours while on the insulin gtt   5. HOLD regular insulin mid dose sliding scale q 6 hours while on the insulin gtt  6. Continue blood glucose monitoring per protocol  7. Diet: tube feeds + ice c rea, per nutrition       Please let us know if you have any questions or concerns about our recommenations. We will continue to follow the patient along with you during his hospitalzation as we attempt to optimize his glycemic control.  Detailed recommendations were communicated to the primary team both verbally and/or through this written note.  I spent a total of 25 minutes with the patient of which 15 minutes (over 50%) was spent in  counseling the patient regarding diabetes management and coordinating care with the primary team.     Signed:  Everlene Farrier, APRN  Inpatient Diabetes Management Team   [facility]: [phone]  Diabetes consult pager # [phone]      NOTE: During nights, weekends, and holidays, please contact the on-call Endocrine Fellow at pager [phone].

## 2020-10-27 NOTE — Plan of Care
Plan of Care Overview/ Patient Status    Propofol continues for patient comfort - when less sedated, pt becomes very agitated, restless, anxious, crying.  Pressure AC vent, no changes this shift.  Suction approximately q2h for thick yellow secretions and large amount oral secretions.  Trach care performed, trach site remains macerated/?exposed tumor.  TF at goal, minimal residuals.  No BM, Banatrol held.  UOP adequate.  Skin as documented.  Received radiation this afternoon without incident.  No family to bedside or via telephone this shift.

## 2020-10-27 NOTE — Other
[facility]    [facility]     Palliative Care Follow-Up Note    Present History   Chart reviewed and case discussed with SICU team. Mr. Goshert was seen this morning lying in bed. Continues on propofol. Per discussion with nursing, he becomes tearful and anxious with any attempts to minimize sedation. He is scheduled for another radiation session this afternoon and due to complete RT tomorrow. Per SICU team, tentative plan is for interdisciplinary team meeting with Mr. Walkup and his family on Thursday. Oncology evaluated him yesterday and has not ruled out the possibility of future systemic therapy if he is able to wean from mechanical ventilation and go to rehab, although they note that this represents the best-case scenario.     Mr. Cuadra remained sedated this morning during my assessment, with no family at bedside. I reached out to his sister [name] this afternoon to offer ongoing support. [name] shares her distress that she has not been able to communicate directly with Killian in recent days, and she fears that he is mad at her because she hasn't been able to visit. She is also coping with her son's cancer diagnosis and complications he is experiencing. She has not heard from ICU team and is hoping for an update. I summarized for her Oncology's notes from yesterday and shared the tentative plans for family meeting Thursday. [name] indicates that Friday would be a better day for the family meeting. She is appreciative of updates and ongoing support.     Review of Allergies/Hx/Meds   Allergies  Patient has No Known Allergies.    Medical History  Past Medical History:   Diagnosis Date   ? Diabetes mellitus (HC Code) (HC CODE)    ? Hypercholesteremia    ? Hypertension        Family History  Family History   Problem Relation Age of Onset   ? Aneurysm Mother        Social History  Social History     Socioeconomic History   ? Marital status: Legally Separated     Spouse name: Not on file   ? Number of children: Not on file   ? Years of education: Not on file   ? Highest education level: Not on file   Occupational History   ? Not on file   Tobacco Use   ? Smoking status: Former Smoker     Packs/day: 1.00     Years: 30.00     Pack years: 30.00     Quit date: 03/04/2013     Years since quitting: 7.6   Substance and Sexual Activity   ? Alcohol use: Yes     Alcohol/week: 14.0 standard drinks     Types: 14 Cans of beer per week   ? Drug use: Not on file   ? Sexual activity: Not on file   Other Topics Concern   ? Not on file   Social History Narrative   ? Not on file     Social Determinants of Health     Financial Resource Strain:    ? Difficulty of Paying Living Expenses:    Food Insecurity:    ? Worried About Programme researcher, broadcasting/film/video in the Last Year:    ? Barista in the Last Year:    Transportation Needs:    ? Freight forwarder (Medical):    ? Lack of Transportation (Non-Medical):    Physical Activity:    ?  Days of Exercise per Week:    ? Minutes of Exercise per Session:    Stress:    ? Feeling of Stress :    Social Connections:    ? Frequency of Communication with Friends and Family:    ? Frequency of Social Gatherings with Friends and Family:    ? Attends Religious Services:    ? Active Member of Clubs or Organizations:    ? Attends Banker Meetings:    ? Marital Status:    Intimate Partner Violence:    ? Fear of Current or Ex-Partner:    ? Emotionally Abused:    ? Physically Abused:    ? Sexually Abused:        InPatient Medications  Current Facility-Administered Medications   Medication Dose Route Frequency Last Rate   ? acetaminophen  650 mg Per G Tube Q6H PRN     ? albuterol  2.5 mg Nebulization Q6H WA     ? ascorbic acid  250 mg Per G Tube Daily     ? chlorhexidine gluconate  15 mL Mouth/Throat Q12H     ? ciprofloxacin HCl  750 mg Per G Tube Q12H     ? clonazePAM  0.5 mg Per J Tube BID     ? cloNIDine HCL  0.2 mg Per G Tube Q8H      Followed by   ? [START ON 10/28/2020] cloNIDine HCL  0.2 mg Per G Tube Q12H      Followed by   ? [START ON 10/31/2020] cloNIDine HCL  0.2 mg Per G Tube Q24H     ? dexAMETHasone  8 mg Per J Tube Q12H     ? dextrose 50% in water (D50W)  12.5 g IV Push Q15 MIN PRN     ? dextrose 50% in water (D50W)  25 g IV Push Q15 MIN PRN     ? insulin regular human 100 units in sodium chloride 0.9 % 100 mL   Intravenous Continuous 4 mL/hr at 10/27/20 1000    And   ? dextrose 50% in water (D50W)  25 g IV Push Q15 MIN PRN      And   ? dextrose 50% in water (D50W)  12.5 g IV Push Q15 MIN PRN     ? doxepin  10 mg Per J Tube Nightly     ? folic acid  1 mg Per G Tube Daily     ? heparin (porcine)  7,500 Units Subcutaneous Q8H     ? insulin glargine  20 Units Subcutaneous Nightly     ? insulin glargine  20 Units Subcutaneous Daily     ? [Held by provider] insulin regular human  28 Units Subcutaneous Q6H     ? LORazepam  0.5 mg IV Push Q6H PRN     ? oxyCODONE  2.5 mg Per G Tube Q4H PRN      Or   ? oxyCODONE  5 mg Per G Tube Q4H PRN     ? propofol (DIPRIVAN) 1,000 mg in 100 mL (10 mg/mL)  5-80 mcg/kg/min Intravenous Continuous 30 mcg/kg/min (10/27/20 1058)   ? Propofol - Injection         ? rocuronium         ? thiamine  50 mg Per G Tube Daily     ? zinc oxide  1 Application Topical (Top) DAILY PRN         [email or web address]    Palliative Performance  Scale 30%   (Modification of Karnofsky and was designed for measurement of physical status in Palliative Care. Only 10% of patients with PPS score <=50% would be expected to survive >6 months)    Physical Exam  Vitals and nursing note reviewed.   Constitutional:       General: He is not in acute distress.     Appearance: He is ill-appearing.      Interventions: He is sedated.   Pulmonary:      Effort: No respiratory distress.      Comments: Trached to vent  Neurological:      Comments: Sedated         Vitals:  Temp:  [97.7 ?F (36.5 ?C)-100.94 ?F (38.3 ?C)] 97.88 ?F (36.6 ?C)  Pulse: [72-118] 74  Resp:  [0-24] 18  BP: (81-127)/(45-99) 95/49  SpO2:  [95 %-100 %] 100 %  ECOG Performance Status (non-calculated): 3  calc. ECOG (from Karnofsky): 2  Wt Readings from Last 3 Encounters:   10/27/20 (!) 146.5 kg   10/06/20 (!) 139.5 kg   09/29/20 (!) 142.4 kg       Review of Labs/Diagnostics  Complete Blood Count:  Recent Labs   Lab 10/25/20  0517 10/26/20  0529 10/27/20  0403   WBC 8.6 8.9 7.3   HGB 6.7* 9.1* 6.9*   HCT 21.80* 29.40* 22.60*   PLT 98* 137* 144*     Comprehensive Metabolic Panel:  Recent Labs   Lab 10/25/20  0517 10/25/20  0636 10/26/20  0529 10/26/20  0532 10/27/20  0403 10/27/20  0552 10/27/20  1101   NA 144  --  142  --  139  --   --    K 4.3  --  4.8  --  5.7*  --   --    CL 105  --  102  --  101  --   --    CO2 28  --  26  --  24  --   --    BUN 59*  --  60*  --  60*  --   --    CREATININE 2.44*  --  2.51*  --  2.41*  --   --    GLU 113*   < > 219*   < > 377*   < > 352*   CALCIUM 9.0  --  9.4  --  8.7*  --   --     < > = values in this interval not displayed.     No results for input(s): ALKPHOS, BILITOT, BILIDIR, PROT, ALT, AST in the last 168 hours.    Invalid input(s): LABALBU    Diagnostics:  No results found.      Impression/Plan:   Abou Sterkel is a 55 y.o. 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 Admitted to SICU on 2/9 for ventilator management following RRT on oncology floor for desaturation and increased work of breathing while on trach mask, ultimately placed back on vent. Referred for Palliative Care Consult by Attending Provider: Alric Quan, MD [phone] for supportive care.    Cancer-related pain:  - Tylenol 650 mg q6hr PRN mild pain  - Oxycodone 2.5 mg q4hr PRN moderate pain OR oxycodone 5 mg q4hr PRN severe pain    Anxiety  - Defer management to Psych  Coping/Support:   - Ongoing assessments for psychosocial and/or spiritual needs  - Full interdisciplinary team support available as needed  - Appreciate primary team advocating with nurse management for an exemption to visitor restrictions to allow 2 visitors, as patient's sister reports mobility challenges making visitation on her own difficult  - Appreciate primary team and nursing helping facilitate communication between patient and his sister and niece    Advance Care Planning:  - Full code  - No advance directives or health care proxy documentation on file  - His sister and niece are listed as emergency contacts and his sister is next of kin and default proxy decision maker in the event that patient lacks capacity    Goals of Care:  - At family meeting of 2/18, Mr. Goodchild confirmed that he would like to continue with current plan of care, with goal of completing radiation and then learning what options are for his care going forward.   - Per SICU team, tentative plans to reconvene for follow-up family meeting later this week. Family are available Friday to join.     Thank you for involving the Palliative Care team in the care of this patient. The Palliative Care team will continue to follow.    Patient was evaluated: in person    Time committed: > 35 minutes were spent in chart review, coordination of care, and at the bedside in patient/family counseling and education, including detailed concepts of symptom managment and goals of care/advanced care planning.    Signed:  Debbrah Alar, APRN  Palliative Care Consult Service  10/27/2020 11:39 AM

## 2020-10-27 NOTE — Progress Notes
[place] [facility]	SICU Progress NoteAttending Provider: Alric Quan, MDPost-Operative Day/Post Injury Day Procedure(s):2/5: [name] exchange (ENT) Hospital LOS: 18 days Interim History:  HPI: 55 y.o. male admitted to the SICU on 2/9 for ventilator management following an RRT on the oncology floor for desaturation and increased work of breathing while on trach mask and ultimately placed back on vent. ?Patient was initially a transfer from [facility] on 2/4 after trach and PEG when he had a partial obstruction of trach site from a new necrotic mass. He underwent trach exchange 2/5. Was on trach mask on oncology floor however had 2 episodes on floor requiring RRT for lavage and likely mucous plugging. ?Course Complicated by: - Incidental R IJ thrombus - Multiple episodes of presumed mucous plugging requiring RRT's ?PMH: Chronic ITP, Waldenstrom macroglobulinemia, HTN. HLD, DM, tobacco and alcohol use, Laryngeal SCC (weekly Cisplatin and RT)?PSH: None known ?Home meds: Lipitor, Pepcid, Lisinopril, Metformin, Oxycodone, miralax, Insulin U100 and Lantus SICU course:2/9: Arrives to SICU HDS, on vent PCV. Appears well. Transitioned to Pressure support on vent and tolerating well. O/N: Episode of patient feeling like not getting a full breath, extremely tachypenic and increased work of breathing and tachycardia, attempted suctioning and lavage without improvement, attempted to switch to PCV and VAC but due to dyssynchrony unable to tolerate, no desat but poor ventilation and TV and ETCO2 up to 100. ENT at bedside do not feel additional scope would benefit as he was just scoped a few hours ago, ultimately gave fentanyl push and started propofol for vent synchrony but despite max prop still dyssynchronous. Gave one dose of roccuronium once sedated for vent synchrony with good effect. Albuterol and HTS neb given and peaks improved from 50 to 30's. No further paralytic needed and now synchronous on VAC. Kept on propofol for vent synchrony. CXR reassuring and back to GCS 11T. Hypotension only following sedation and Rocc, Low dose Neo started. 2/10: Kept on higher sedation in AM for [place] A/P (for staging) and soft tissue neck. Weaning sedation and will plan for PSV trial once more awake. Scoped by ENT in AM, airway patent. Q1-2hr suction requirements of thick tan secretions. No further episodes of respiratory distress although sedation remains high. Tube feeds changed to continuous given significant hyperglycemia. Insulin regimen changed per diabetes team recommendations. Pressure ulceration noted around tracheostomy --> ENT at bedside to evaluation, likely some component of invasive tumor growth. Palliative care consult for assist with symptom management and family moving forward. Primary oncologist office made aware of referral. O/N: weaning down on Prop, no episodes of respiratory distress or desats overnight. Febrile to 101.3, sputum and blood cultures ordered. 500cc LR bolus given for some oliguria and rising Cr. 2/11: Given 500cc NS for oliguria with good response. Vanco level yesterday 25, likely cause of worsening aki, on hold. UOP & lytes remains stable. Insulin adjusted per diabetes team. PSV as tolerated (doing well). CPT added. Confirmed with ENT pulm recs for blue portex suctionaid trach- no plans to switch at this time. Bowel regimen added. Plan keep on neo if low dose but if inc, consider changing to levo as likely then not in setting of sedation. Family updated on phone. Confirmed w heme- no AC rec for R IJ thrombus until plt > 50. Was going to attempt to place pt on TM overnight as he did very well on PSV x 3hrs, however, pt had anxiety attack regarding his cancer and prognosis, he requested to sleep during night and was agreeable to TM in  am. O/N: Patient agrees to PSV overnight which he did very well on. Agreeable to getting OOB to chair and possibly trach mask in AM but wants to stay on vent tonight. Off Neo by AM.  2/12: trial trach mask as tolerated this AM. Adjusted insulin regimen per the diabetes management service. Sputum now with staph, awaiting sensitivities. More loose stools today; nursing placed rectal tube and held bowel regimen.  ON: Anxious at times, feeling like he cannot breath but doing well on TM. Frequent suctioning. Increased standing. FIO2 increased to 40%.  insulin to 14U per endo reccs. 2/13: continue to adjust insulin regimen, now 20u regular q6. Cr continues to uptrend 2.57 --> 2.63 and repeat vanco level still elevated 24.6; pharmacy to continue dosing adjustments. Otherwise, remains on trach mask. Sputum with MRSA --> d/c ceftriaxone. ON: increased standing insulin to 24U. 2/14: Will discuss [facility] plan with hematology now that PLT > 50 x2 checks. Will also d/c vancomycin today (10d of therapy thus far). AKI appears to have peaked at 2.57 -> 2.63 -> 2.47. Awaiting transfer to the oncology service on NP 15. Okay to start [facility], but will hold off today given more bloody secretions. 2/15: Plan to start palliative RT today. AM session aborted due to extreme anxiety (despite premedication with IV ativan), tried again in the PM with 10mg  IV Valium + 3mg  IV ativan, procedure again aborted due to extreme anxiety and inability to tolerate. Pending discussion with rad onc in AM re: utility of ongoing attempts at RT. Pending transfer to NP15 on med onc service.  O/N: increasingly anxious / restless, but able to re-direct and calm down with PRN ativan. Liquid stools, placed rectal tube. Will start banatrol and check CBC in AM, check c dif. This had previously improved by holding bowel regimen.2/16: Acutely increased WOB, HR 130s, SBP 170s --> ENT bedside scope with trach patent, though notable secretions --> suctioned with minimal improvement; placed back on ventilator PSV 10 / +5 with immediate improvement in VS and respiratory distress. Precedex gtt started to assist more frequent suctioning. CXR with LLL consolidation --> resend sputum, and restart Vanco (close trough monitoring with AKI) given known MRSA PNA. Increased diarrhea, cdiff negative, transition to Vital TF. LR 500cc for hemoconcentration of labs, tachycardia. Appreciate radiation oncology; sedated patient with Propofol gtt, bolus x1 with good effect --> able to tolerate 15 minute radiation session; plan to repeat Thursday, Friday, Monday, and Tuesday. 2/17: Psych consulted for depression--> Doxepin added, Klonopin added. Hypernatremia--> D5W and free water flushes added. Ceftaz added for GNR in sputum. Plan for family discussion this PM (see separate note) and radiation this PM. Foley replaced for retention. ON: Tolerated PSV 15/5. Repeat Na 148.2/18: Plan for radiation this afternoon. Rectal tube removed.. Hgb 6.3--> transfused 1un PRBCs. Discussed care with sister and niece at bedside, remain full code and plan to continue radiation course through Tuesday (see family communication note).2/19: PSV weaned 15 --> 12/+5. Start Clonidine and discontinue Precedex gtt. Discontinue Gabapentin and Dilaudid IV, decrease Oxycodone to 2.5 / 5. Na improved 144, discontinue D5W; sugars should improve, maintain current regimen per DM team. GNR per micro lab not pseudomonas, klebsiella, or other enteric organism; would have speciated --> change Ceftaz to PO Cipro, to complete course 2/23 for one week. Cr again increased with Vanco level over 20 --> next dose held, monitor level closely. Hgb remains low despite 1U yesterday, 6.3 --> 7.0; start multivitamins and check with Heme. ON: Low grade fevers stable. Given ativan  0.5mg  in place of klonipin with good anxiolytic effect. Transfuse 1u PRBC (likely half unit) for Hgb 6.7.2/20: Acute respiratory distress this morning after turn, minimal secretions, did not improve with increased pressure or VAC --> bagged x5 mins with improvement, placed on PCV, IP 20 --> CXR pending, start Propofol, add back Ativan and Morphine PRN. Appreciate heme assistance with persistent anemia. Cr continues to rise following elevated vanco trough; hold any further doses, level will likely remain therapeutic to final date of course (2/22) --> unfortunately, patient did receive additional dose of Vanco 2g per pharmacy order this morning; discontinued placeholder, consider Doxycycline if Vanco level subtherapeutic prior to end date.ON: Remains on PCV. Remained on low dose levo 0.02 throughout night. Manual suctioning by RT overnight with thin tan secretions. AM labs notable for Hgb 9.1(6.7) after 1uPRBC transfusion yesterday. Worsening AKI Cr 2.51 (2.44).2/21: Patient alert and breathing comfortably this morning --> wean PEEP back to 5, consider retrial of PSV today pending radiation plans. Free water flushes discontinued, hypernatremia resolved. Worsening AKI from Vanco; since discontinued, monitor UOP. LR 250cc, wean off Levo gtt. Appreciate palliative care assistance after event yesterday, patient requesting further discussion. Lantus changed to 15U QHS, 10U QAM and bolus increased to 28U Q6.ON: Acute respiratory distress at change of shift, lost tidal volumes and desaturated to high 80s requiring bagging, recovered. Pt expressing that he cannot breathe, feels obstructed --> suctioned with 10Fr suction w/ resistance met at 20cm and minimal secretions. ENT at bedside for scope --> thick secretions noted but airway patent.. Noted that Bivona trach was retracted, advanced to proper position by ENT. Per ENT, tracheal tissue proximal to bivona tip very flimsy. Likely tissue collapsing against distal tip with trach retracted in poor position causing obstruction. Patient acutely distressed and agitated during event, propofol gtt restarted will plan to wean off tonight. Start decadron 8mg  q12 per onc recs for peritumoral inflammation. Hyperkalemic in AM to 5.7 --> EKG unchanged, insulin/dextrose/Ca2+ x 1. Transfuse 1u PRBC for hgb 6.9. 2/22: Keep on PCV today and continue propofol gtt. Start Insulin gtt and increase Lantus to 20u BID. No bowel movements --> discontinue banatrol. Plan for radiation today. Discussed transfer to MICU no beds available, will initiate transfer once a bed becomes available. Plan for multidisciplinary family meeting this week. Review of Allergies/Meds/Hx: I have reviewed the patient's: allergies, past medical history, past surgical history, family history, social history, prior to admission medicationsObjective: Vitals:I have reviewed the patient's current vital signs as documented in the EMRTemp:  [99.14 ?F (37.3 ?C)-100.94 ?F (38.3 ?C)] 99.14 ?F (37.3 ?C)Pulse:  [76-118] 77Resp:  [0-26] 18BP: (81-128)/(47-99) 124/69SpO2:  [95 %-100 %] 98 %SpO2: 98 %Device (Oxygen Therapy): mechanical ventilatorI/O's:I have reviewed the patient's current I&O's as documented in the EMR.Gross Totals (Last 24 hours) at 10/27/2020 0006Last data filed at 10/26/2020 2300Intake 2616.39 ml Output 2030 ml Net 586.39 ml Physical ExamConstitutional:     General: Anxious gentleman appearing older than stated age. HENT:    Head: Normocephalic and atraumatic.    Mouth: Mucous membranes are moist.    Extraocular Movements: Extraocular movements intact.    Pupils: Pupils are equal, round, and reactive to light.    Comments: Tracheostomy in placeCardiovascular:    Rate and Rhythm: Regular rhythm. Regular rate   Heart sounds: No murmur heard. Pulmonary:    Breath sounds: No wheezing, rhonchi or rales.    Comments: Ventilated via trachAbdominal:    General: There is no distension.    Tenderness: There is no abdominal  tenderness.    Comments: PEG tube in place with TFs runningGenitourinary:   Comments: Foley in place draining clear, yellow urineMusculoskeletal:    Lower legs: Mild edema   Comments: Moves all extremities equally, to command.Skin:   General: Skin is warm and dry.    Capillary Refill: Capillary refill takes less than 2 seconds. Neurological:    General: No focal deficit present.    Mental Status: He is alert.    Cranial Nerves: No cranial nerve deficit.    Motor: No weakness. Labs: Refer to EMR. I have reviewed the patient's labs within the last 24 hours; see assessment and plan below for significant abnormals.Microbiology:Refer to EMR. I have reviewed all new results within the last 24 hours; see assessment and plan below.Diagnostics:Refer to EMR. I have reviewed all new results within the last 24 hours; see assessment and plan below.Chest VZ:DGLOV to EMR. I have reviewed all new results within the last 24 hours; see assessment and plan below.Abdominal FI:EPPIR to EMR. I have reviewed all new results within the last 24 hours; see assessment and plan below.ECG/Tele Events: I have reviewed the patient's ECG and telemetry as resulted in the EMR.Assessment/Plan Neurologic: Analgesia/sedation - Tylenol 650 Q6 PRN - Oxycodone 2.5 / 5mg  Q4 PRN - Morphine 2mg  Q4 PRN breakthrough- Propofol gtt Anxiety- Clonidine taper s/p Precedex gtt- Klonopin 0.5mg  BID- Ativan 0.5mg  IV Q6 PRN acute agitation- Doxepin 10mg  QHS- Appreciate palliative care and psych consults?ENT: L laryngeal SCC, invading tracheostomy: s/p trach exchange 10/10/20 and biopsy 10/12/20- Bivona trach to bypass obstruction - Unfortunately, there are no surgical / curative options- Palliative radiation x5 days (2/16, 2/17, 2/18, 2/21, 2/22); requires PCV with full propofol sedation?Cardiac: Hypotension likely in setting of sedation- MAP > 60- Consider levo gtt if needed vs fluid Hx of HTN / HLD- Hold home lisinopril, lasix, crestor given AKI?Respiratory:  Respiratory failure- Due to tumor invasion of tracheal stoma; though trach is patent, tumor appears circumferential; also complicated by high secretion burden and frequent mucous plugging- Multiple events this hospitalization of respiratory distress due to trach occlusion- Decadron 8mg  BID started 2/21 to help with peritumoral inflammation oer med onc recs, likely continue for 2-3 days- PCV: IP 20, PEEP decrease to +5 --> retrial PSV 15/+5 if stable- Albuterol Q6?GI: Diarrhea, resolved - PEG prior to admission- TF to Vital @ 95- Okay for ice cream, sips and chips?Renal: AKI on CKD (baseline Cr 1.5-1.6), 2/2 supratherapeutic vanco- Cr peaked 2.6 --> uptrending again with vanco level ~20, similar to last event; Cr 2.5- Foley replaced 2/17 for urinary retention?Infectious Disease: MRSA PNA; resolved- S/p Vanco (2/5 - 2/14, 2/16 - 2/22) - If need to restart, would use Doxycycline4+ GNR PNA- Per Micro, cannot speciate, but confirmed no enteric organism, pseudomonas, klebsiella, ecoli- Ceftaz (2/17- 2/19), change to Cipro (2/19 - 2/23)?Hematology:Critical illness anemia- Sanguinous secretions, expected given tumor burden and coagulopathy, may worsen with radiation and baseline hematologic disease- Multivitamins and cofactors- Maintain Hgb >7?Hx of chronic ITP, Waldenstrom macroglobulinemia- Transfuse platelets <50 if bleeding, <10 if no active bleeding- Appreciate hematology recommendations?R IJ thrombus - High risk heparin gtt when plt consistently >50- Hep SC - Maintain SCDs at all times?Endocrine: Hx T2DM- Lantus 20u BID - Start Insulin gtt - HOLD home Metformin, Lantus 64u, Apidra 20u TID w meals- Appreciate diabetes team recsMusculoskeletal: Generalized deconditioning- OOB daily, PT/OT evals- Turn and reposition Q2 hours, PUP dressing?Device Review:- R IJ Port (08/12/20) per guidelines, nonocclusive DVT does not require removal of line- PEG (09/14/20)- Bivona trach (10/10/20)?Code  Status / Dispo:- Full code - Per ENT, patient to transfer to medical oncology after SICU- Appreciate palliative care assistance with GOC discussionsNotifications : Please call SICU Covering Provider assigned to patient via Epic EMR (primary) [facility] Dynamic Roles (secondary)[facility] 7-1 APP		[phone]:Teyah Rossy, PA-CSICU APP2/22/2022

## 2020-10-28 ENCOUNTER — Encounter: Admit: 2020-10-28 | Payer: PRIVATE HEALTH INSURANCE

## 2020-10-28 ENCOUNTER — Inpatient Hospital Stay: Admit: 2020-10-28 | Payer: PRIVATE HEALTH INSURANCE

## 2020-10-28 ENCOUNTER — Ambulatory Visit: Admit: 2020-10-28 | Payer: PRIVATE HEALTH INSURANCE

## 2020-10-28 DIAGNOSIS — J9601 Acute respiratory failure with hypoxia: Secondary | ICD-10-CM

## 2020-10-28 DIAGNOSIS — C329 Malignant neoplasm of larynx, unspecified: Secondary | ICD-10-CM

## 2020-10-28 LAB — CBC WITH AUTO DIFFERENTIAL
BKR WAM ABSOLUTE IMMATURE GRANULOCYTES.: 0.24 x 1000/ÂµL (ref 0.00–0.30)
BKR WAM ABSOLUTE LYMPHOCYTE COUNT.: 0.22 x 1000/ÂµL — ABNORMAL LOW (ref 0.60–3.70)
BKR WAM ABSOLUTE NRBC (2 DEC): 0 x 1000/ÂµL (ref 0.00–1.00)
BKR WAM ANALYZER ANC: 11 x 1000/ÂµL — ABNORMAL HIGH (ref 2.00–7.60)
BKR WAM BASOPHIL ABSOLUTE COUNT.: 0.01 x 1000/ÂµL (ref 0.00–1.00)
BKR WAM BASOPHILS: 0.1 % (ref 0.0–1.4)
BKR WAM EOSINOPHIL ABSOLUTE COUNT.: 0.01 x 1000/ÂµL (ref 0.00–1.00)
BKR WAM EOSINOPHILS: 0.1 % (ref 0.0–5.0)
BKR WAM HEMATOCRIT (2 DEC): 25.9 % — ABNORMAL LOW (ref 38.50–50.00)
BKR WAM HEMOGLOBIN: 8.2 g/dL — ABNORMAL LOW (ref 13.2–17.1)
BKR WAM IMMATURE GRANULOCYTES: 2 % — ABNORMAL HIGH (ref 0.0–1.0)
BKR WAM LYMPHOCYTES: 1.8 % — ABNORMAL LOW (ref 17.0–50.0)
BKR WAM MCH (PG): 27.2 pg (ref 27.0–33.0)
BKR WAM MCHC: 31.7 g/dL (ref 31.0–36.0)
BKR WAM MCV: 85.8 fL (ref 80.0–100.0)
BKR WAM MONOCYTE ABSOLUTE COUNT.: 0.81 x 1000/ÂµL (ref 0.00–1.00)
BKR WAM MONOCYTES: 6.6 % (ref 4.0–12.0)
BKR WAM MPV: 11.7 fL (ref 8.0–12.0)
BKR WAM NEUTROPHILS: 89.4 % — ABNORMAL HIGH (ref 39.0–72.0)
BKR WAM NUCLEATED RED BLOOD CELLS: 0 % (ref 0.0–1.0)
BKR WAM PLATELETS: 202 x1000/ÂµL (ref 150–420)
BKR WAM RDW-CV: 18 % — ABNORMAL HIGH (ref 11.0–15.0)
BKR WAM RED BLOOD CELL COUNT.: 3.02 M/ÂµL — ABNORMAL LOW (ref 4.00–6.00)
BKR WAM WHITE BLOOD CELL COUNT: 12.3 x1000/ÂµL — ABNORMAL HIGH (ref 4.0–11.0)

## 2020-10-28 LAB — MAGNESIUM: BKR MAGNESIUM: 2.3 mg/dL (ref 1.7–2.4)

## 2020-10-28 LAB — BASIC METABOLIC PANEL
BKR ANION GAP: 15 (ref 7–17)
BKR BLOOD UREA NITROGEN: 70 mg/dL — ABNORMAL HIGH (ref 6–20)
BKR BUN / CREAT RATIO: 28.8 — ABNORMAL HIGH (ref 8.0–23.0)
BKR CALCIUM: 9.3 mg/dL (ref 8.8–10.2)
BKR CHLORIDE: 104 mmol/L (ref 98–107)
BKR CO2: 22 mmol/L (ref 20–30)
BKR CREATININE: 2.43 mg/dL — ABNORMAL HIGH (ref 0.40–1.30)
BKR EGFR (AFR AMER): 34 mL/min/{1.73_m2} (ref >60)
BKR EGFR (NON AFRICAN AMERICAN): 28 mL/min/{1.73_m2} (ref >60)
BKR GLUCOSE: 126 mg/dL — ABNORMAL HIGH (ref 70–100)
BKR POTASSIUM: 4.4 mmol/L (ref 3.3–5.3)
BKR SODIUM: 141 mmol/L (ref 136–144)

## 2020-10-28 LAB — PHOSPHORUS     (BH GH L LMW YH): BKR PHOSPHORUS: 3.7 mg/dL (ref 2.2–4.5)

## 2020-10-28 LAB — COPPER, SERUM: COPPER: 152 ug/dL (ref 70–175)

## 2020-10-28 MED ORDER — LACTATED RINGERS IV BOLUS (NEW BAG)
Freq: Once | INTRAVENOUS | Status: CP
Start: 2020-10-28 — End: ?
  Administered 2020-10-28: 15:00:00 1000.000 mL/h via INTRAVENOUS

## 2020-10-28 MED ORDER — INSULIN GLARGINE 100 UNIT/ML SUBCUTANEOUS SOLUTION
100 unit/mL | Freq: Once | SUBCUTANEOUS | Status: CP
Start: 2020-10-28 — End: ?
  Administered 2020-10-28: 15:00:00 100 mL via SUBCUTANEOUS

## 2020-10-28 MED ORDER — CEFTRIAXONE IV PUSH 1000 MG VIAL & NS (ADULTS)
INTRAVENOUS | Status: DC
Start: 2020-10-28 — End: 2020-10-29
  Administered 2020-10-28 – 2020-10-29 (×2): 10.000 mL via INTRAVENOUS

## 2020-10-28 NOTE — Progress Notes
Otolaryngology Progress NoteDate: 10/27/2020 Interim History- not eligible for MICU transfer- family meeting Thursday VitalsTemp:  [36.1 ?C-38.3 ?C] 36.2 ?CPulse:  [65-118] 76Resp:  [8-24] 18BP: (84-127)/(45-96) 98/54SpO2:  [94 %-100 %] 94 %Device (Oxygen Therapy): mechanical ventilatorI/O last 3 completed shifts:In: 3652.7 [P.O.:120; I.V.:842.7; Blood:275; ZO/XW:9604; IV Piggyback:50]Out: 1890 [Urine:1890]Physical ExamGen: alert, NADEyes: open, normal external appearanceFace: grossly symmetricNose: clear anteriorlyOP/OC: hemostaticNeck: Bivona 8 cuffed secured w/soft trach ties, necrotic but hemostatic mass surrounding the stoma, remaining neck flat, firm tumor Pulm: no increased WOB on TMDiagnostic DataReviewed, per chartAssessment & Plan54 M with PMH of HTN, HLD, DM, tobacco and EtOH use, Waldenstrom macroglobulinemia, 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 Admitted to SICU for ventilatory support. No acute ENT intervention planned; no curative or meaningful surgical options at this time. Course complicated by multiple episodes of Bivona trach occlusion/obstruction.  - will attempt for family meeting attendance Thursday - no ENT intervention planned at this time; case discussed at tumor board 2/14 and is not a surgical candidate - disease management/goals-of-care best pursued via non-surgical means; eg, Medical Oncology, Radiation Oncology, Palliative Care- agree with dexamethasone 8 mg bid per Medical Oncology for peritumoral inflammation- transfer to Medical Oncology floor when eligible - continue routine trach care with careful suctioning- ensure trach well secured with Karn Cassis, MDPGY-1, Anesthesiology

## 2020-10-28 NOTE — Plan of Care
Plan of Care Overview/ Patient Status    Neuro - remains on Propofol for sedation, which was temporarily increased for radiation therapy.  When awake, pt tearful, mouthing the words no baby, CAM+, impulsive, posing risk to airway at times.  PRNs given as documented with slight effect.  Follows most commands.  No restraints required.  VSS, afebrile.  No vent changes today.  Suction requirements this AM were q1h for moderate amount tan blood streaked sputum (culture sent).  This afternoon approx q3h for much less quantity.  Trach care performed.  TF at goal, UOP adequate, +flatus no BM.  Skin as charted.  Insulin gtt per protocol as well as Lantus as ordered.  No family at bedside today.

## 2020-10-28 NOTE — Progress Notes
RADIATION THERAPY NOTE2/23/2022 Treatment 6 of 6 delivered today. Radiation Therapist Comments:Radiation Therapy Nurse Comments:Daniel Longo2:33 PM

## 2020-10-28 NOTE — Progress Notes
Inpatient Diabetes Management Team Follow-up Note    Over the past 24 hours, the patient's glucose control has been at goal while on insulin infusion     ? Current Nutritional Status:  Continuous tube feeding with Vital AF 1.2 @ 95 cc/hr PLUS Full liquid diet ie ice crean, ice chips and sips   ? Current Anti-hyperglycemic Regimen:  Insulin infusion per protocol, glargine 20 units qHS, glargine 20 units qAM, HELD regular insulin 28 units q 6 hours  ?  Home Anti-hyperglycemic Regimen: Toujeo U300 64 units daily, Apidra 20 units TID with meals, metformin 1000 mg bid   ? Other Contributing Medications:   ? Dexamethasone 8 mg q12 hours starting the evening of 2/2  ? Levophed infusion discontinued at 1 AM    PE:    Patient laying in bed in NAD  Temp:  [96.98 ?F (36.1 ?C)-97.88 ?F (36.6 ?C)] 97.88 ?F (36.6 ?C)  Pulse:  [65-84] 79  Resp:  [0-28] 0  BP: (91-112)/(52-96) 105/60  SpO2:  [94 %-100 %] 96 %  Device (Oxygen Therapy): mechanical ventilator     Wt Readings from Last 3 Encounters:   10/28/20 (!) 144.9 kg   10/06/20 (!) 139.5 kg   09/29/20 (!) 142.4 kg       Data Review:  BGs:      Creatinine (mg/dL)   Date Value   40/98/1191 2.43 (H)     Hemoglobin A1c (%)   Date Value   10/10/2020 9.0 (H)       Assessment:  55 yo h/o type 2 diabetes, HTN, Waldenstroms macroglobulinemia, ETOH, laryngeal SCC s/p trach/PEG presented with SOB significant for tumor invading the trach stoma with partial obstruction.  Diabetes team consulted for assistance in glycemic management.      BG over the past 24 hours have been at goal while on the insulin infusion.  He remains on dexamethasone and discussed with team will plan to continue with the insulin infusion while on high dose dexamethasone and once it is discontinued will transition back to standing regular insulin for the tube feeds.  Discussed plan with the covering provider, Carollee Sires, PA PA.  Given patient on continuous tube feedings, target blood glucose between 140mg /dl-180mg /dL.  If bolus regular insulin is administered and TFs are held patient is at risk of developing hypoglycemia. Please check BG q1 hour x6 hours and start IV fluids with D5 if BG < 100 mg/dL    Recommendations:   1. Continue insulin infusion per protocol  2. Continue glargine 20 units qAM  3. Continue glargine 20 units qHS  4. HOLD regular insulin 28 units q 6 hours while on the insulin gtt   5. HOLD regular insulin mid dose sliding scale q 6 hours while on the insulin gtt  6. Continue blood glucose monitoring per protocol  7. Diet: tube feeds + full liquids per primary team      Please let us know if you have any questions or concerns about our recommenations. We will continue to follow the patient along with you during his hospitalzation as we attempt to optimize his glycemic control.  Detailed recommendations were communicated to the primary team both verbally and/or through this written note.  I spent a total of 20 minutes with the patient of which 15 minutes (over 50%) was spent in counseling the patient regarding diabetes management and coordinating care with the primary team.     Signed:  Yevonne Aline PA  Inpatient Diabetes Management Team  [facility]: [phone]  Diabetes consult pager # [phone]      NOTE: During nights, weekends, and holidays, please contact the on-call Endocrine Fellow at pager [phone].

## 2020-10-28 NOTE — Progress Notes
[name]	SICU Progress NoteAttending Provider: Alric Quan, MDPost-Operative Day/Post Injury Day Procedure(s):2/5: [name] exchange (ENT) [name] LOS: 19 days Interim History:  HPI: 55 y.o. male admitted to the SICU on 2/9 for ventilator management following an RRT on the oncology floor for desaturation and increased work of breathing while on trach mask and ultimately placed back on vent. ?Patient was initially a transfer from [facility] on 2/4 after trach and PEG when he had a partial obstruction of trach site from a new necrotic mass. He underwent trach exchange 2/5. Was on trach mask on oncology floor however had 2 episodes on floor requiring RRT for lavage and likely mucous plugging. ?Course Complicated by: - Incidental R IJ thrombus - Multiple episodes of presumed mucous plugging requiring RRT's ?PMH: Chronic ITP, Waldenstrom macroglobulinemia, HTN. HLD, DM, tobacco and alcohol use, Laryngeal SCC (weekly Cisplatin and RT)?PSH: None known ?Home meds: Lipitor, Pepcid, Lisinopril, Metformin, Oxycodone, miralax, Insulin U100 and Lantus SICU course:2/9: Arrives to SICU HDS, on vent PCV. Appears well. Transitioned to Pressure support on vent and tolerating well. O/N: Episode of patient feeling like not getting a full breath, extremely tachypenic and increased work of breathing and tachycardia, attempted suctioning and lavage without improvement, attempted to switch to PCV and VAC but due to dyssynchrony unable to tolerate, no desat but poor ventilation and TV and ETCO2 up to 100. ENT at bedside do not feel additional scope would benefit as he was just scoped a few hours ago, ultimately gave fentanyl push and started propofol for vent synchrony but despite max prop still dyssynchronous. Gave one dose of roccuronium once sedated for vent synchrony with good effect. Albuterol and HTS neb given and peaks improved from 50 to 30's. No further paralytic needed and now synchronous on VAC. Kept on propofol for vent synchrony. CXR reassuring and back to GCS 11T. Hypotension only following sedation and Rocc, Low dose Neo started. 2/10: Kept on higher sedation in AM for [place] A/P (for staging) and soft tissue neck. Weaning sedation and will plan for PSV trial once more awake. Scoped by ENT in AM, airway patent. Q1-2hr suction requirements of thick tan secretions. No further episodes of respiratory distress although sedation remains high. Tube feeds changed to continuous given significant hyperglycemia. Insulin regimen changed per diabetes team recommendations. Pressure ulceration noted around tracheostomy --> ENT at bedside to evaluation, likely some component of invasive tumor growth. Palliative care consult for assist with symptom management and family moving forward. Primary oncologist office made aware of referral. O/N: weaning down on Prop, no episodes of respiratory distress or desats overnight. Febrile to 101.3, sputum and blood cultures ordered. 500cc LR bolus given for some oliguria and rising Cr. 2/11: Given 500cc NS for oliguria with good response. Vanco level yesterday 25, likely cause of worsening aki, on hold. UOP & lytes remains stable. Insulin adjusted per diabetes team. PSV as tolerated (doing well). CPT added. Confirmed with ENT pulm recs for blue portex suctionaid trach- no plans to switch at this time. Bowel regimen added. Plan keep on neo if low dose but if inc, consider changing to levo as likely then not in setting of sedation. Family updated on phone. Confirmed w heme- no AC rec for R IJ thrombus until plt > 50. Was going to attempt to place pt on TM overnight as he did very well on PSV x 3hrs, however, pt had anxiety attack regarding his cancer and prognosis, he requested to sleep during night and was agreeable to TM in  am. O/N: Patient agrees to PSV overnight which he did very well on. Agreeable to getting OOB to chair and possibly trach mask in AM but wants to stay on vent tonight. Off Neo by AM.  2/12: trial trach mask as tolerated this AM. Adjusted insulin regimen per the diabetes management service. Sputum now with staph, awaiting sensitivities. More loose stools today; nursing placed rectal tube and held bowel regimen.  ON: Anxious at times, feeling like he cannot breath but doing well on TM. Frequent suctioning. Increased standing. FIO2 increased to 40%.  insulin to 14U per endo reccs. 2/13: continue to adjust insulin regimen, now 20u regular q6. Cr continues to uptrend 2.57 --> 2.63 and repeat vanco level still elevated 24.6; pharmacy to continue dosing adjustments. Otherwise, remains on trach mask. Sputum with MRSA --> d/c ceftriaxone. ON: increased standing insulin to 24U. 2/14: Will discuss [facility] plan with hematology now that PLT > 50 x2 checks. Will also d/c vancomycin today (10d of therapy thus far). AKI appears to have peaked at 2.57 -> 2.63 -> 2.47. Awaiting transfer to the oncology service on NP 15. Okay to start [facility], but will hold off today given more bloody secretions. 2/15: Plan to start palliative RT today. AM session aborted due to extreme anxiety (despite premedication with IV ativan), tried again in the PM with 10mg  IV Valium + 3mg  IV ativan, procedure again aborted due to extreme anxiety and inability to tolerate. Pending discussion with rad onc in AM re: utility of ongoing attempts at RT. Pending transfer to NP15 on med onc service.  O/N: increasingly anxious / restless, but able to re-direct and calm down with PRN ativan. Liquid stools, placed rectal tube. Will start banatrol and check CBC in AM, check c dif. This had previously improved by holding bowel regimen.2/16: Acutely increased WOB, HR 130s, SBP 170s --> ENT bedside scope with trach patent, though notable secretions --> suctioned with minimal improvement; placed back on ventilator PSV 10 / +5 with immediate improvement in VS and respiratory distress. Precedex gtt started to assist more frequent suctioning. CXR with LLL consolidation --> resend sputum, and restart Vanco (close trough monitoring with AKI) given known MRSA PNA. Increased diarrhea, cdiff negative, transition to Vital TF. LR 500cc for hemoconcentration of labs, tachycardia. Appreciate radiation oncology; sedated patient with Propofol gtt, bolus x1 with good effect --> able to tolerate 15 minute radiation session; plan to repeat Thursday, Friday, Monday, and Tuesday. 2/17: Psych consulted for depression--> Doxepin added, Klonopin added. Hypernatremia--> D5W and free water flushes added. Ceftaz added for GNR in sputum. Plan for family discussion this PM (see separate note) and radiation this PM. Foley replaced for retention. ON: Tolerated PSV 15/5. Repeat Na 148.2/18: Plan for radiation this afternoon. Rectal tube removed.. Hgb 6.3--> transfused 1un PRBCs. Discussed care with sister and niece at bedside, remain full code and plan to continue radiation course through Tuesday (see family communication note).2/19: PSV weaned 15 --> 12/+5. Start Clonidine and discontinue Precedex gtt. Discontinue Gabapentin and Dilaudid IV, decrease Oxycodone to 2.5 / 5. Na improved 144, discontinue D5W; sugars should improve, maintain current regimen per DM team. GNR per micro lab not pseudomonas, klebsiella, or other enteric organism; would have speciated --> change Ceftaz to PO Cipro, to complete course 2/23 for one week. Cr again increased with Vanco level over 20 --> next dose held, monitor level closely. Hgb remains low despite 1U yesterday, 6.3 --> 7.0; start multivitamins and check with Heme. ON: Low grade fevers stable. Given ativan  0.5mg  in place of klonipin with good anxiolytic effect. Transfuse 1u PRBC (likely half unit) for Hgb 6.7.2/20: Acute respiratory distress this morning after turn, minimal secretions, did not improve with increased pressure or VAC --> bagged x5 mins with improvement, placed on PCV, IP 20 --> CXR pending, start Propofol, add back Ativan and Morphine PRN. Appreciate heme assistance with persistent anemia. Cr continues to rise following elevated vanco trough; hold any further doses, level will likely remain therapeutic to final date of course (2/22) --> unfortunately, patient did receive additional dose of Vanco 2g per pharmacy order this morning; discontinued placeholder, consider Doxycycline if Vanco level subtherapeutic prior to end date.ON: Remains on PCV. Remained on low dose levo 0.02 throughout night. Manual suctioning by RT overnight with thin tan secretions. AM labs notable for Hgb 9.1(6.7) after 1uPRBC transfusion yesterday. Worsening AKI Cr 2.51 (2.44).2/21: Patient alert and breathing comfortably this morning --> wean PEEP back to 5, consider retrial of PSV today pending radiation plans. Free water flushes discontinued, hypernatremia resolved. Worsening AKI from Vanco; since discontinued, monitor UOP. LR 250cc, wean off Levo gtt. Appreciate palliative care assistance after event yesterday, patient requesting further discussion. Lantus changed to 15U QHS, 10U QAM and bolus increased to 28U Q6.ON: Acute respiratory distress at change of shift, lost tidal volumes and desaturated to high 80s requiring bagging, recovered. Pt expressing that he cannot breathe, feels obstructed --> suctioned with 10Fr suction w/ resistance met at 20cm and minimal secretions. ENT at bedside for scope --> thick secretions noted but airway patent.. Noted that Bivona trach was retracted, advanced to proper position by ENT. Per ENT, tracheal tissue proximal to bivona tip very flimsy. Likely tissue collapsing against distal tip with trach retracted in poor position causing obstruction. Patient acutely distressed and agitated during event, propofol gtt restarted will plan to wean off tonight. Start decadron 8mg  q12 per onc recs for peritumoral inflammation. Hyperkalemic in AM to 5.7 --> EKG unchanged, insulin/dextrose/Ca2+ x 1. Transfuse 1u PRBC for hgb 6.9. 2/22: Keep on PCV today and continue propofol gtt. Start Insulin gtt and increase Lantus to 20u BID. No bowel movements --> discontinue banatrol. Plan for radiation today. Discussed transfer to MICU no beds available, will initiate transfer once a bed becomes available. Plan for multidisciplinary family meeting this week. ON: Rested on PCV, low dose prop. WBC count 12 (7) this am2/23: Plan for last palliative radiation session later today. RR decreased to 12. Given rising white count and increased in line suctioning requirement Sputum culture sent and CXR obtained. Will stop Cipro and start ceftriaxone. 1L LR bolus given for AKI, rising BUN as well as likely concentration on CBC. Plan to start weaning off prop after radiation and attempt PSV trials again. Family discussion with all teams Thursday or Friday. CPT added back given increased secretions attempted to add back HTS nebs however [name] is completely out per pharmacy discussion. Respiratory culture again growing GPC and GNR. Review of Allergies/Meds/Hx: I have reviewed the patient's: allergies, past medical history, past surgical history, family history, social history, prior to admission medicationsObjective: Vitals:I have reviewed the patient's current vital signs as documented in the EMRTemp:  [96.98 ?F (36.1 ?C)-97.88 ?F (36.6 ?C)] 97.52 ?F (36.4 ?C)Pulse:  [67-84] 77Resp:  [0-28] 16BP: (91-107)/(46-64) 97/46SpO2:  [93 %-100 %] 98 %SpO2: 98 %Device (Oxygen Therapy): mechanical ventilatorI/O's:I have reviewed the patient's current I&O's as documented in the EMR.Gross Totals (Last 24 hours) at 10/28/2020 1442Last data filed at 10/28/2020 1200Intake 3939.23 ml Output 1435 ml Net  2504.23 ml Physical ExamConstitutional:     General: Anxious gentleman appearing older than stated age. HENT:    Head: Normocephalic and atraumatic.    Mouth: Mucous membranes are moist.    Extraocular Movements: Extraocular movements intact.    Pupils: Pupils are equal, round, and reactive to light.    Comments: Tracheostomy in placeCardiovascular:    Rate and Rhythm: Regular rhythm. Regular rate   Heart sounds: No murmur heard. Pulmonary:    Breath sounds: No wheezing, rhonchi or rales.    Comments: Ventilated via trachAbdominal:    General: There is no distension.    Tenderness: There is no abdominal tenderness.    Comments: PEG tube in place with TFs runningGenitourinary:   Comments: Foley in place draining clear, yellow urineMusculoskeletal:    Lower legs: Mild edema   Comments: Moves all extremities equally, to command.Skin:   General: Skin is warm and dry.    Capillary Refill: Capillary refill takes less than 2 seconds. Neurological:    General: No focal deficit present.    Mental Status: He is alert.    Cranial Nerves: No cranial nerve deficit.    Motor: No weakness. Labs: Refer to EMR. I have reviewed the patient's labs within the last 24 hours; see assessment and plan below for significant abnormals.Microbiology:Refer to EMR. I have reviewed all new results within the last 24 hours; see assessment and plan below.Diagnostics:Refer to EMR. I have reviewed all new results within the last 24 hours; see assessment and plan below.Chest ZO:XWRUE to EMR. I have reviewed all new results within the last 24 hours; see assessment and plan below.Abdominal AV:WUJWJ to EMR. I have reviewed all new results within the last 24 hours; see assessment and plan below.ECG/Tele Events: I have reviewed the patient's ECG and telemetry as resulted in the EMR.Assessment/Plan Neurologic: Analgesia/sedation - Tylenol 650 Q6 PRN - Oxycodone 2.5 / 5mg  Q4 PRN - Morphine 2mg  Q4 PRN breakthrough- Propofol gtt, wean as able  Anxiety- Clonidine taper s/p Precedex gtt- Klonopin 0.5mg  BID- Ativan 0.5mg  IV Q6 PRN acute agitation- Doxepin 10mg  QHS- Appreciate palliative care and psych consults?ENT: L laryngeal SCC, invading tracheostomy: s/p trach exchange 10/10/20 and biopsy 10/12/20- Bivona trach to bypass obstruction - Unfortunately, there are no surgical / curative options- Palliative radiation(2/16, 2/17, 2/18, 2/21, 2/22, 2/23); requires PCV with full propofol sedation?Cardiac: Hypotension likely in setting of sedation- MAP > 60Hx of HTN / HLD- Hold home lisinopril, lasix, crestor given AKI?Respiratory:  Respiratory failure- Due to tumor invasion of tracheal stoma; though trach is patent, tumor appears circumferential; also complicated by high secretion burden and frequent mucous plugging- Multiple events this hospitalization of respiratory distress due to trach occlusion- Decadron 8mg  BID started 2/21 to help with peritumoral inflammation oer med onc recs, likely continue for 2-3 days- PCV: IP 20, PEEP  +5 --> retrial PSV 15/+5 if stable- Albuterol Q6- CPT 4 times daily - Attempted to add back HTS nebs however [name] is completely out per pharmacy discussion. [name] ?GI: Diarrhea, resolved - PEG prior to admission- TF to Vital @ 95- Okay for ice cream, sips and chips?Renal: AKI on CKD (baseline Cr 1.5-1.6), 2/2 supratherapeutic vanco, improving - Cr peaked 2.6 --> uptrending again with vanco level ~20, similar to last event; Cr 2.5- Foley replaced 2/17 for urinary retention- Fluid resuscitation as clinically indicated ?Infectious Disease: MRSA PNA; resolved- S/p Vanco (2/5 - 2/14, 2/16 - 2/22) - If need to restart, would use Doxycycline4+ GNR PNA- Per Micro, cannot speciate, but confirmed no  enteric organism, pseudomonas, klebsiella, ecoli- S/P Ceftaz (2/17- 2/19), change to Cipro (2/19 - 2/23)- Ceftriaxone 2/23-?) given leukocytosis and increasing suctioning requirements -Repeat Sputum Culture 2/23- GPC and GNR, awaiting speciation ?Hematology:Critical illness anemia- Sanguinous secretions, expected given tumor burden and coagulopathy, may worsen with radiation and baseline hematologic disease- Multivitamins and cofactors- Maintain Hgb >7?Hx of chronic ITP, Waldenstrom macroglobulinemia- Transfuse platelets <50 if bleeding, <10 if no active bleeding- Appreciate hematology recommendations?R IJ thrombus - High risk heparin gtt when plt consistently >50- Hep SC - Maintain SCDs at all times?Endocrine: Hx T2DM- Lantus 20u BID, additional 10u AM 2/23, conside increased dose - Insulin gtt - HOLD home Metformin, Lantus 64u, Apidra 20u TID w meals- Appreciate diabetes team recsMusculoskeletal: Generalized deconditioning- OOB daily, PT/OT evals- Turn and reposition Q2 hours, PUP dressing?Device Review:- R IJ Port (08/12/20) per guidelines, nonocclusive DVT does not require removal of line- PEG (09/14/20)- Bivona trach (10/10/20)?Code Status / Dispo:- Full code - Per ENT, patient to transfer to medical oncology after SICU- Appreciate palliative care assistance with GOC discussionsNotifications : Please call SICU Covering Provider assigned to patient via Epic EMR (primary) [facility] [employer]oles (secondary)[facility] 7-1 APP		[phone]:Nathali Vent Carolin Sicks, PASICU APP2/23/2022

## 2020-10-28 NOTE — Consults
[facility] CenterHematology eConsult NoteCONSULTED BY: Attending Provider: Alric Quan, MD [phone] OF SERVICE: 2/11/2022REASON FOR CONSULT: Comanagement while pt is off onc floorHistory of Present Illness:  HPI: 55 y.o. 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, T3NxMx L laryngeal SCC diagnosed 06/2020, started on cis+RT 09/2020 (received cis *2 and RT 35/70 Gy), s/p trach/PEG, who was transferred from [place]?on 2/4?due to SOB w /exam significant for tumor invading the trach stoma and scope showing partial obstruction.?[name] was changed to a Bivona on 2/5, which bypasses the obstruction.During this admission, Gorman of the neck was performed on 02/05 which showed a laryngeal mass with stromal invasion and involvement of the thyroid gland, to trachea, hypopharynx, and proximal cervical esophagus with left level 3 and level 4 lymphadenopathy and incidentally found a right IJ thrombus. Galva chest showed mediastinal adenopathy (including a subcarinal LN 2.4 cm) and circumferential thickening of trachea concerning for metastatic disease. CTAP on 2/9 showed new borderline retroperitoneal LAD (e.g., 1.1 cm paraortic LN). On 2/7, a bedside biopsy was performed for tumor profiling and path.Since admission, he has had multiple episodes of tachypnea 2/2 mucus plugging, for which TAART/RRT was called. Pulm was consulted for possible trach stent placement, who did not think pt has an indication for it as mucus plugging was the etiology. His trach was connected to vent and transferred to SICU further management 10/14/2020. He is currently CTX and vancomycin for possible PNA. His course was also complicated with prolonged cytopenia, particularly thrombocytopenia. Hematology was consulted for it and thought it might be ITP+/- chemotherapy effects, and recommended considering dex 40 * 4 doses.For oncological treatment planning, he was evaluated by radiation oncology. His primary oncologist Dr. Drenda Freeze and head and neck oncologist at [facility] Dr. Laurina Bustle were contacted for input. He currently has at least locally advanced disease, if not metastatic disease. Will no longer be treated with curative intent. Next step would be palliative radiation with concurrent chemotherapy. Given his persistent thrombocytopenia, palliative RT did not start until 2/16 and is ongoing, with plan to be completed on 2/23.Per SICU team's converstation with RT team, patient is responding to RT. His condition remains critical as he is still dependent on mechanical ventilation and per SICU team this is related to deconditioning and cancer. He had a new event of acute SOB with tracheostomy obstruction that required increased sedation, suctioning and initiation of vasopressors on 2/20 overnight. Per SICU team, there is not a lot of mucus, but peritumoral inflammation.We saw patient today 2/21 as he requested to hear oncology's perspectives on future therapies and prognosis. We shared that we are hopeful that RT will continue to work in the next following weeks after completion of therapy, since it peaks its effects in 8-12 weeks. In the meantime, the plan would be to work on weaning him from mechanical ventilation, which will be challenging per SICU perspective. If that is achieved, he can be sent to a rehab facility and meet with oncology as outpatient to decide on further systemic therapy. We shared that this is the best case scenario, but things may no happen this way for many reasons. He can have at any time an acute event of mucus or tumor obstruction that can be life threatening; he can also not be able to be weaned from ventilator.  If he remains ventilator dependent without perspective of improvement, best supportive care and hospice would be appropriate. We shared that at this  time, our recommendations would be to complete RT for the next 1-2 days as planned and work on his recovery on a day by day basis. We cannot at this point determine if he will or will not respond to this current treatment or give him an exact estimation of survival. He seemed to understand and agreed to continue with RT and reassessments. SICU team was present during the meeting and did clarify to him that the most challenging portion of his recovery will be weaning him from ventilation, which may or not be possible, and this may also take a long time to happen. He also seemed to understand that. He had no further questions at this time.Medical History: Past Medical History Past Surgical History Past Medical History: Diagnosis Date ? Diabetes mellitus (HC Code) (HC CODE)  ? Hypercholesteremia  ? Hypertension   No past surgical history on file. Social History Family History Social History Tobacco Use ? Smoking status: Former Smoker   Packs/day: 1.00   Years: 30.00   Pack years: 30.00   Quit date: 03/04/2013   Years since quitting: 7.6 Substance Use Topics ? Alcohol use: Yes   Alcohol/week: 14.0 standard drinks   Types: 14 Cans of beer per week ? Drug use: Not on file Social History Substance and Sexual Activity Drug Use Not on file  Family History Problem Relation Age of Onset ? Aneurysm Mother   Outpatient Medications Prior to Admission Medication Medication Sig Dispense Refill ? atorvastatin (LIPITOR) 20 mg tablet TAKE 1 TABLET BY MOUTH ONCE DAILY   ? famotidine (PEPCID) 20 mg tablet Take 20 mg by mouth 2 (two) times daily.   ? furosemide (LASIX) 20 mg tablet 1 tablet (20 mg total) by Per G Tube route daily. 30 tablet 1 ? insulin aspart U-100 (NOVOLOG) 100 unit/mL vial Inject under the skin 3 (three) times daily before meals. 8 units before breakfast and lunch, 14 units before dinner   ? insulin glargine,hum.rec.anlog (TOUJEO SOLOSTAR U-300 INSULIN SUBQ) Inject 62 Units under the skin daily.    ? lisinopriL (PRINIVIL,ZESTRIL) 10 mg tablet TAKE 1 TABLET BY MOUTH ONCE DAILY   ? LORazepam (ATIVAN) 0.5 mg tablet Take 0.5 mg by mouth every 6 (six) hours as needed for anxiety.   ? metFORMIN (GLUCOPHAGE) 1000 mg tablet Take 1,000 mg by mouth daily.    ? albuterol (PROVENTIL, VENTOLIN) 2.5 mg /3 mL (0.083 %) nebulizer solution Take 3 mLs by nebulization daily.   ? azelastine (ASTELIN) 137 mcg (0.1 %) nasal spray Use 2 sprays in each nostril every 8 (eight) hours as needed for rhinitis. Use in each nostril as directed (Patient not taking: Reported on 10/11/2020)   ? magnesium oxide (MAGOX) 400 mg (241.3 mg magnesium) tablet 1 tablet (400 mg total) by Per G Tube route daily. 200 tablet 1 ? oxyCODONE (ROXICODONE) 5 mg Immediate Release tablet Take 5 mg by mouth every 4 (four) hours as needed.   ? polyethylene glycol (MIRALAX) 17 gram packet Take 17 g by mouth daily. Mix in 8 ounces of water, juice, soda, coffee or tea prior to taking. (Patient not taking: Reported on 10/11/2020)   ? traZODone (DESYREL) 50 mg tablet Take 25 mg by mouth nightly. (Patient not taking: Reported on 10/11/2020)    Current Medications Scheduled Meds:Current Facility-Administered Medications Medication Dose Route Frequency Provider Last Rate Last Admin ? albuterol neb sol 2.5 mg/3 mL (0.083%) (PROVENTIL,VENTOLIN)  2.5 mg Nebulization Q6H [place], [place], [place]   2.5 mg at 10/26/20 1318 ?  ascorbic acid (vitamin C) (VITAMIN C) tablet 250 mg  250 mg Per G Tube Daily [place], [place]   250 mg at 10/26/20 1324 ? Banatrol Plus oral powder packet  1 packet Per G Tube TID Jennye Boroughs, [place]   1 packet at 10/26/20 1428 ? chlorhexidine gluconate (PERIDEX) 0.12 % solution 15 mL  15 mL Mouth/Throat Q12H Leonia Reeves, PA   15 mL at 10/26/20 4010 ? ciprofloxacin HCl (CIPRO) tablet 750 mg  750 mg Per G Tube Q12H Jennye Boroughs, PA   750 mg at 10/26/20 2725 ? clonazePAM (KlonoPIN) tablet 0.5 mg  0.5 mg Per J Tube BID Mat Carne, APRN   0.5 mg at 10/26/20 3664 ? cloNIDine HCL (CATAPRES) tablet 0.2 mg  0.2 mg Per G Tube Q8H Jennye Boroughs, PA   0.2 mg at 10/26/20 1605  Followed by ? Melene Muller ON 10/28/2020] cloNIDine HCL (CATAPRES) tablet 0.2 mg  0.2 mg Per G Tube Q12H Jennye Boroughs, PA      Followed by ? Melene Muller ON 10/31/2020] cloNIDine HCL (CATAPRES) tablet 0.2 mg  0.2 mg Per G Tube Q24H Jennye Boroughs, PA     ? doxepin (SINEquan) solution 10 mg  10 mg Per J Tube Nightly Mat Carne, APRN   10 mg at 10/25/20 2050 ? folic acid (FOLVITE) tablet 1 mg  1 mg Per G Tube Daily [place], [place], [place]   1 mg at 10/26/20 4034 ? heparin (porcine) injection 5,000 Units  5,000 Units Subcutaneous Q8H Lisette Abu, PA   5,000 Units at 10/26/20 1428 ? insulin glargine (Semglee,Lantus) injection 10 Units  10 Units Subcutaneous Nightly Jennye Boroughs, [place]     ? [START ON 10/27/2020] insulin glargine (Semglee,Lantus) injection 15 Units  15 Units Subcutaneous Daily Jennye Boroughs, [place]     ? insulin regular human (HumuLIN R, NovoLIN R) 100 unit/mL injection 28 Units  28 Units Subcutaneous Q6H Jennye Boroughs, [place]   28 Units at 10/26/20 1721 ? insulin regular human (HumuLIN R, NovoLIN R) Sliding Scale (See admin instructions for dose)   Subcutaneous Q6H Lisette Abu, PA   4 Units at 10/26/20 1721 ? Propofol - Injection (DIPRIVAN) 10 mg/mL IV push only          ? rocuronium (ZEMURON) 10 mg/mL injection          ? thiamine (VITAMIN B1) tablet 50 mg  50 mg Per G Tube Daily Jennye Boroughs, PA   50 mg at 10/26/20 7425 ? Vancomycin MAR Level   Intravenous Once Alric Quan, MD     Continuous Infusions:? norepinephrine 0.02 mcg/kg/min (10/26/20 1700) ? [Held by provider] propofol (DIPRIVAN) 1,000 mg in 100 mL (10 mg/mL) Stopped (10/26/20 1141) PRN Meds:.acetaminophen, dextrose 50% in water (D50W), dextrose 50% in water (D50W), LORazepam, oxyCODONE **OR** oxyCODONE, zinc oxide Allergies No Known Allergies Review of Systems All other systems reviewed and are negative.As above. Remainder of 14 point ROS reviewed and negative and non contributory. Vital signs and Exam: Vitals:Temp:  [99.32 ?F (37.4 ?C)-100.76 ?F (38.2 ?C)] 100.22 ?F (37.9 ?C)Pulse:  [82-116] 87Resp:  [0-26] 0BP: (81-138)/(45-99) 96/56SpO2:  [95 %-100 %] 95 % I/O's:Intake/Output Summary (Last 24 hours) at 10/26/2020 1749Last data filed at 10/26/2020 1700Gross per 24 hour Intake 3225.65 ml Output 1925 ml Net 1300.65 ml  Physical Exam:Not performed for this econsult encounterObjective Data: MetabolicRecent Labs Lab 02/18/220527 02/18/220527 02/19/220536 02/19/220536 02/20/220517 02/20/220517 02/21/220529 NA 148*  --  144  --  144  --  142 K 4.8   < >  4.4   < > 4.3   < > 4.8 CL 106   < > 104   < > 105   < > 102 CO2 28   < > 27   < > 28   < > 26 BUN 55*   < > 57*   < > 59*   < > 60* CREATININE 2.19*   < > 2.29*   < > 2.44*   < > 2.51* ANIONGAP 14   < > 13   < > 11   < > 14 CALCIUM 8.8   < > 9.0   < > 9.0   < > 9.4 MG 2.3   < > 2.2   < > 2.1   < > 2.4 PHOS 4.3   < > 4.1   < > 4.5   < > 3.2  < > = values in this interval not displayed.  HematologicRecent Labs Lab 02/18/221128 02/18/221128 02/19/220536 02/19/220536 02/20/220517 02/20/220517 02/21/220529 WBC 7.6  --  7.9  --  8.6  --  8.9 HGB 6.3*   < > 7.0*   < > 6.7*   < > 9.1* HCT 20.60*   < > 22.30*   < > 21.80*   < > 29.40* PLT 57*   < > 79*   < > 98*   < > 137* NEUTROPHILS 80.0*   < > 82.1*   < > 82.4*   < > 82.1* ANCANC 6.06   < > 6.51   < > 7.06   < > 7.30  < > = values in this interval not displayed.  HepaticRecent Labs Lab 02/20/220517 PTT 26.2 INR 1.10  SpecialRecent Labs Lab 02/20/220517 LDH 174 FIBRINOGEN 660* FERRITIN 863*  Imaging: XR Chest PA or APResult Date: 2/20/2022Tracheostomy tip is approximately 8 cm from the carina. No new consolidation or pulmonary edema. Improved aeration at the left lung base compared to prior exam. No pneumothorax. Reported And Signed By: Maryjane Hurter, MD  [facility] Radiology and Biomedical ImagingXR Chest PA or APResult Date: 2/16/2022Left basilar consolidation and small pleural effusion.  Reported And Signed By: Maudie Flakes, MD  [facility] Radiology and Biomedical ImagingPathology/Molecular Diagnostics:Surgical Case ?? ? ? ? ? ? ? ? ? ? ? ? ? ? ? ? ? ? ? ? ? ? ?SURGICAL PATHOLOGY REPORT ? ? ? ? ? ? ? ? ? ? ? ? ? ? ? ? ? ? ? ? ? ? ? ? Patient: Arthur [name] ? ? ? ? ? ? ? ? ?MR #: [id number] Accession #: [id number] ? ? ? ?Submitted by: Alric Quan, MD FINAL DIAGNOSIS LEFT NECK MASS, BIOPSY: ? ? ? ? ? ? - SQUAMOUS CELL CARCINOMA, WELL DIFFERENTIATED KERATINIZING TYPE  Surgical Consults ?? ? ? ? ? ? ? ? ? ? ? ? ? ? ? ? ? ? ? ? ? ? ?SURGICAL PATHOLOGY CONSULT REPORT ? ? ? ? ? ? ? ? ? ? ? ? ? ? ? ? ? ? ? ? ? ? ? ? Patient: Arthur [name] ? ? ? ? ? ? ? ? ?MR #: [id number] Accession #: [id number] ? ? ? ?Submitted by: Isaiah Blakes Daphene Jaeger, M.D. FINAL DIAGNOSIS [facility], [place], [place], [phone], 4 SL, 06/10/20: ? A. VOCAL CORD, TRUE LEFT, BIOPSY #1 (A.FS01-1 & A.2-1): ? ? ? ? ? - MINUTE FRAGMENT OF SQUAMOUS CELL CARCINOMA B. VOCAL CORD, TRUE LEFT, BIOPSY #2 (B.FS01-1): ? ? ? ? ? - SQUAMOUS CELL CARCINOMA, MODERATELY DIFFERENTIATED KERATINIZING  TYPE ? ? ?- SEE NOTE C. VOCAL CORD, TRUE LEFT, BIOPSY #3 (C.1-1): ? ? ? ? ? - SQUAMOUS CELL CARCINOMA, MODERATELY DIFFERENTIATED KERATINIZING TYPE Note: Tumor cells are invading into the stromal tissue. ? ? Pathologist: Lynnell Catalan, M.D. ? ?06/23/2020 13:51 ? ? ?* Report Electronically Signed Out * ? ? ? ? ? ? ? ? ? ? ? ? This electronic signature indicates that the pathologist has personally reviewed the available gross and/or microscopic material and has based the diagnosis on that evaluation.  Assessment and Plan: 55 y.o. 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 His course this admission was complicated by PNA currently on broad-spectrum abx, frequent mucus plugging episodes now on vent requiring SICU transfer, incidental finding on imaging of R IJ thrombosis, and persistent cytopenia particularly thrombocytopenia likely 2/2 ITP +/- chemotherapy effects. Re: his laryngeal SCC, restaging scan this admission showed worsening of local disease (a laryngeal mass with stromal invasion and involvement of the thyroid gland, to trachea, hypopharynx, and proximal cervical esophagus with left level 3 and level 4 lymphadenopathy), as well as metastatic disease. CTAP on 2/9 showed new borderline retroperitoneal LAD (e.g., 1.1 cm paraortic LN), c/f mets. For oncological treatment planning, he was evaluated by radiation oncology. His primary oncologist Dr. Daphene Jaeger and head and neck oncologist at [facility] Dr. Laurina Bustle were contacted for input. He currently has at least locally advanced disease, if not metastatic disease. Will no longer be treated with curative intent. Next step would be palliative radiation with concurrent chemotherapy. Given his persistent thrombocytopenia, palliative RT did not start until 2/16 and is ongoing, with plan to be completed on 2/23.Per SICU team's converstation with RT team, patient is responding to RT. His condition remains critical as he is still dependent on mechanical ventilation and per SICU team this is related to deconditioning and cancer. He had a new event of acute SOB with tracheostomy obstruction that required increased sedation, suctioning and initiation of vasopressors on 2/20 overnight. Per SICU team, there is not a lot of mucus, but peritumoral inflammation.We saw patient today 2/21 as he requested to hear oncology's perspectives on future therapies and prognosis. We shared that we are hopeful that RT will continue to work in the next following weeks after completion of therapy, since it peaks its effects in 8-12 weeks. In the meantime, the plan would be to work on weaning him from mechanical ventilation, which will be challenging per SICU perspective. If that is achieved, he can be sent to a rehab facility and meet with oncology as outpatient to decide on further systemic therapy, which could include doublet chemotherapy and cetuximab depending on performance status and blood counts. We shared that this is the best case scenario, but things may no happen this way for many reasons. He can have at any time an acute event of mucus or tumor obstruction that can be life threatening; he can also not be able to be weaned from ventilator. If he remains ventilator dependent without perspective of improvement, best supportive care and hospice would be appropriate. We shared that at this time, our recommendations would be to complete  RT for the next 1-2 days as planned and work on his recovery on a day by day basis. We cannot at this point determine if he will or will not respond to this current treatment or give him an exact estimation of survival. He seemed to understand and agreed to continue with RT and reassessments. SICU team was present during the meeting and did clarify to him that the most challenging portion of his recovery will be weaning him from ventilation, which may or not be possible, and this may also take a long time to happen. He also seemed to understand that. He had no further questions at this time.- We offered to participate on future official family meetings as needed alongside SICU team.- In terms of acute concern with inflammation from RT being the cause of tracheostomy obstruction, we did suggest giving him dexamethasone 8mg  BID for the next 2-3 daysWe will continue to follow.Discussed with Dr. Otelia Santee.Dr. Daphene Jaeger and Dr. Laurina Bustle were recontacted on 2/21 and they agree with this plan.

## 2020-10-28 NOTE — Plan of Care
Plan of Care Overview/ Patient Status    Problem: Adult Inpatient Plan of CareGoal: Readiness for Transition of CareOutcome: Interventions implemented as appropriate S/p tracheostomy on mechanical ventilations.Care Management will continue to follow with team. Gershon Crane RN, BSN, [facility] Manager NICU/SICUPhone# [phone]#  [phone]

## 2020-10-28 NOTE — Plan of Care
Plan of Care Overview/ Patient Status    [id number]: Patient sedated on propofol @ 30 mcg/kg/min. RASS -1/0. Opens eyes spontaneously, MAE, follows commands. Tearful at times, much emotional support provided. No c/o pain.  CV: Afebrile. NSR on tele, HR 60-80s. SBP stable with MAPs maintain goal > 60. Palpable peripheral pulses. Resp: Trach #8 Bivona. Pressure AC vent, 30% FiO2. Suctioned Q1 for large amount of thick tan sputum. Trach site macerated, xeroform applied. GI/GU: Hypoactive BS. LBM 02/20. Full strength Vital AF @ 95cc/hr (goal) tolerating well. Insulin gtt titrated per protocol. Foley in place draining cloudy yellow urine. Skin/Lines: Excoriation to groin/buttocks, z-guard applied. T&P Q2. Port, PIV x 2.

## 2020-10-29 ENCOUNTER — Inpatient Hospital Stay: Admit: 2020-10-29 | Payer: PRIVATE HEALTH INSURANCE

## 2020-10-29 DIAGNOSIS — J9601 Acute respiratory failure with hypoxia: Secondary | ICD-10-CM

## 2020-10-29 LAB — BASIC METABOLIC PANEL
BKR ANION GAP: 16 (ref 7–17)
BKR BLOOD UREA NITROGEN: 68 mg/dL — ABNORMAL HIGH (ref 6–20)
BKR BUN / CREAT RATIO: 30.8 — ABNORMAL HIGH (ref 8.0–23.0)
BKR CALCIUM: 8.8 mg/dL (ref 8.8–10.2)
BKR CHLORIDE: 105 mmol/L (ref 98–107)
BKR CO2: 23 mmol/L (ref 20–30)
BKR CREATININE: 2.21 mg/dL — ABNORMAL HIGH (ref 0.40–1.30)
BKR EGFR (AFR AMER): 38 mL/min/{1.73_m2} (ref >60)
BKR EGFR (NON AFRICAN AMERICAN): 31 mL/min/{1.73_m2} (ref >60)
BKR GLUCOSE: 131 mg/dL — ABNORMAL HIGH (ref 70–100)
BKR POTASSIUM: 5.3 mmol/L (ref 3.3–5.3)
BKR SODIUM: 144 mmol/L (ref 136–144)

## 2020-10-29 LAB — URINALYSIS-MACROSCOPIC W/REFLEX MICROSCOPIC
BKR BILIRUBIN, UA: NEGATIVE
BKR GLUCOSE, UA: NEGATIVE
BKR KETONES, UA: NEGATIVE
BKR LEUKOCYTE ESTERASE, UA: POSITIVE — AB
BKR NITRITE, UA: NEGATIVE
BKR PH, UA: 6 (ref 5.5–7.5)
BKR SPECIFIC GRAVITY, UA: 1.018 (ref 1.005–1.030)
BKR UROBILINOGEN, UA: <2 EU/dL (ref <=2.0)

## 2020-10-29 LAB — URINE MICROSCOPIC     (BH GH LMW YH)

## 2020-10-29 LAB — CBC WITH AUTO DIFFERENTIAL
BKR WAM ABSOLUTE IMMATURE GRANULOCYTES.: 0.29 x 1000/ÂµL (ref 0.00–0.30)
BKR WAM ABSOLUTE LYMPHOCYTE COUNT.: 0.24 x 1000/ÂµL — ABNORMAL LOW (ref 0.60–3.70)
BKR WAM ABSOLUTE NRBC (2 DEC): 0 x 1000/ÂµL (ref 0.00–1.00)
BKR WAM ANALYZER ANC: 12.7 x 1000/ÂµL — ABNORMAL HIGH (ref 2.00–7.60)
BKR WAM BASOPHIL ABSOLUTE COUNT.: 0.02 x 1000/ÂµL (ref 0.00–1.00)
BKR WAM BASOPHILS: 0.1 % (ref 0.0–1.4)
BKR WAM EOSINOPHIL ABSOLUTE COUNT.: 0.02 x 1000/ÂµL (ref 0.00–1.00)
BKR WAM EOSINOPHILS: 0.1 % (ref 0.0–5.0)
BKR WAM HEMATOCRIT (2 DEC): 28.7 % — ABNORMAL LOW (ref 38.50–50.00)
BKR WAM HEMOGLOBIN: 9 g/dL — ABNORMAL LOW (ref 13.2–17.1)
BKR WAM IMMATURE GRANULOCYTES: 2 % — ABNORMAL HIGH (ref 0.0–1.0)
BKR WAM LYMPHOCYTES: 1.7 % — ABNORMAL LOW (ref 17.0–50.0)
BKR WAM MCH (PG): 26.9 pg — ABNORMAL LOW (ref 27.0–33.0)
BKR WAM MCHC: 31.4 g/dL (ref 31.0–36.0)
BKR WAM MCV: 85.9 fL (ref 80.0–100.0)
BKR WAM MONOCYTE ABSOLUTE COUNT.: 1 x 1000/ÂµL (ref 0.00–1.00)
BKR WAM MONOCYTES: 7 % (ref 4.0–12.0)
BKR WAM MPV: 11.3 fL (ref 8.0–12.0)
BKR WAM NEUTROPHILS: 89.1 % — ABNORMAL HIGH (ref 39.0–72.0)
BKR WAM NUCLEATED RED BLOOD CELLS: 0 % (ref 0.0–1.0)
BKR WAM PLATELETS: 226 x1000/ÂµL (ref 150–420)
BKR WAM RDW-CV: 18 % — ABNORMAL HIGH (ref 11.0–15.0)
BKR WAM RED BLOOD CELL COUNT.: 3.34 M/ÂµL — ABNORMAL LOW (ref 4.00–6.00)
BKR WAM WHITE BLOOD CELL COUNT: 14.3 x1000/ÂµL — ABNORMAL HIGH (ref 4.0–11.0)

## 2020-10-29 LAB — MAGNESIUM: BKR MAGNESIUM: 2.1 mg/dL (ref 1.7–2.4)

## 2020-10-29 LAB — PHOSPHORUS     (BH GH L LMW YH): BKR PHOSPHORUS: 5.9 mg/dL — ABNORMAL HIGH (ref 2.2–4.5)

## 2020-10-29 MED ORDER — HYDROMORPHONE 0.5 MG/0.5 ML INJECTION SYRINGE
0.5 mg/ mL | Freq: Once | INTRAVENOUS | Status: CP
Start: 2020-10-29 — End: ?
  Administered 2020-10-29: 12:00:00 0.5 mL via INTRAVENOUS

## 2020-10-29 MED ORDER — OXYCODONE IMMEDIATE RELEASE 5 MG TABLET
5 mg | GASTROSTOMY | Status: DC | PRN
Start: 2020-10-29 — End: 2020-10-29

## 2020-10-29 MED ORDER — OXYCODONE IMMEDIATE RELEASE 15 MG TABLET
15 mg | GASTROSTOMY | Status: DC | PRN
Start: 2020-10-29 — End: 2020-11-02
  Administered 2020-10-29 – 2020-10-31 (×6): 15 mg via GASTROSTOMY

## 2020-10-29 MED ORDER — VANCOMYCIN IVPB (2 G IN 500ML NS)
INTRAVENOUS | Status: DC
Start: 2020-10-29 — End: 2020-10-29

## 2020-10-29 MED ORDER — HYDROMORPHONE 0.5 MG/0.5 ML INJECTION SYRINGE
0.50.5 mg/ mL | INTRAVENOUS | Status: DC | PRN
Start: 2020-10-29 — End: 2020-10-30
  Administered 2020-10-29: 19:00:00 0.5 mL via INTRAVENOUS

## 2020-10-29 MED ORDER — VANCOMYCIN IVPB (2 G IN 500ML NS)
Freq: Once | INTRAVENOUS | Status: CP
Start: 2020-10-29 — End: ?
  Administered 2020-10-29: 20:00:00 500.000 mL/h via INTRAVENOUS

## 2020-10-29 MED ORDER — NOREPINEPHRINE BITARTRATE 16 MG/250 ML (64 MCG/ML) IN 0.9 % NACL IV
1625064 mg/250 mL (64 mcg/mL) | INTRAVENOUS | Status: DC
Start: 2020-10-29 — End: 2020-11-03
  Administered 2020-10-29 – 2020-11-03 (×6): 16 mL/h via INTRAVENOUS

## 2020-10-29 MED ORDER — HYDROMORPHONE 0.5 MG/0.5 ML INJECTION SYRINGE
0.5 mg/ mL | Status: CP
Start: 2020-10-29 — End: ?
  Administered 2020-10-29: 19:00:00 0.5 mg/ mL via INTRAVENOUS

## 2020-10-29 MED ORDER — OXYCODONE IMMEDIATE RELEASE 5 MG TABLET
5 mg | GASTROSTOMY | Status: DC | PRN
Start: 2020-10-29 — End: 2020-11-02
  Administered 2020-10-30 – 2020-11-02 (×10): 5 mg via GASTROSTOMY

## 2020-10-29 MED ORDER — VANCOMYCIN MAR LEVEL
Freq: Once | INTRAVENOUS | Status: CP
Start: 2020-10-29 — End: ?

## 2020-10-29 MED ORDER — VANCOMYCIN THERAPY PLACEHOLDER
INTRAVENOUS | Status: DC
Start: 2020-10-29 — End: 2020-11-01

## 2020-10-29 MED ORDER — LACTATED RINGERS IV BOLUS (NEW BAG)
Freq: Once | INTRAVENOUS | Status: CP
Start: 2020-10-29 — End: ?
  Administered 2020-10-29: 14:00:00 1000.000 mL/h via INTRAVENOUS

## 2020-10-29 MED ORDER — PIPERACILLIN-TAZOBACTAM (ZOSYN) 4.5GM MBP
Freq: Four times a day (QID) | INTRAVENOUS | Status: DC
Start: 2020-10-29 — End: 2020-10-31
  Administered 2020-10-29 – 2020-10-31 (×8): 100.000 mL/h via INTRAVENOUS

## 2020-10-29 MED ORDER — MICONAZOLE NITRATE 2 % TOPICAL CREAM
2 % | Freq: Two times a day (BID) | TOPICAL | Status: DC
Start: 2020-10-29 — End: 2020-11-17
  Administered 2020-10-30 – 2020-11-17 (×38): 2 % via TOPICAL

## 2020-10-29 NOTE — Significant Event
Pt manually removed tracheostomy tube this morning while confused. Desaturated to 65%. Manually replaced with obturator, however easily placed too far into trachea beyond stomal skin/tumor prior to attachment to Bivona collar. Withdrawn and fixed to collar. Pt able to be suctioned for copious prurulent secretions and quickly recovered saturations to 99%. He was breathing spontaneously throughout. ENT called urgently and scoped, confirmed good placement of Bivona. Considering multiple malpositioning events with patients unique anatomy due to tumor we did request that they discuss custom Bivona options. - continue to have obturator and additional tracheostomy supplies at bedside.- patient restraints- confusion likely due to recurrent pneumonia, continuing to treat- ENT to discuss custom Bivona options for patient.Burna Cash, MDTrauma, Surgical Critical Care, and Acute Care Surgery2/24/2022

## 2020-10-29 NOTE — Other
[facility]    [facility]     Palliative Care Follow-Up Note    Present History   Chart reviewed and case discussed with SICU team. Arthur [name] is unable to participate in assessment today, requiring additional sedation for agitation and anxiety, also with concern for worsening infection, requiring re-initiation of Levophed. Also of note, he pulled out his trach this morning during an episode of agitation, with ENT called urgently to confirm re-placement.     I reached out to patient's sister Arthur [name] this afternoon to discuss tentative plans for a family meeting tomorrow. I shared concern that Arthur [name] condition is worsening and that he is unable to make medical decisions for himself at this time. Arthur [name] expresses distress about not being able to talk to [name] about his wishes and not being able to ask him how to address any of his unresolved financial matters. I reiterated that as his next of kin, Arthur [name] becomes his proxy decision maker when he lacks capacity to make decisions for himself. Arthur [name] perception is that Arthur [name] was trying to let her know earlier in the week that he was dying, or that he wished to be allowed to die. She would like to hear from the ICU team to better understand his condition and the big picture before she makes a decision. She plans to talk to her niece Arthur [name] and her pastor this evening to help her think through next steps. She is aware that Arthur [name] may be moving toward the end of his life. She reiterates that she wishes to be called with any changes in his condition and would do whatever she can to get here in time to say goodbye should he appear to be imminently dying. She worries that the snow/ice will prevent her and Arthur [name] from joining family meeting in person, but we discussed that we could have this conversation via Zoom or conference call as needed. She requests that this be arranged for 2 or 3pm if possible to help accommodate [name]'s family needs.     Review of Allergies/Hx/Meds   Allergies  Patient has No Known Allergies.    Medical History  Past Medical History:   Diagnosis Date   ? Diabetes mellitus (HC Code) (HC CODE)    ? Hypercholesteremia    ? Hypertension        Family History  Family History   Problem Relation Age of Onset   ? Aneurysm Mother        Social History  Social History     Socioeconomic History   ? Marital status: Legally Separated     Spouse name: Not on file   ? Number of children: Not on file   ? Years of education: Not on file   ? Highest education level: Not on file   Occupational History   ? Not on file   Tobacco Use   ? Smoking status: Former Smoker     Packs/day: 1.00     Years: 30.00     Pack years: 30.00     Quit date: 03/04/2013     Years since quitting: 7.6   Substance and Sexual Activity   ? Alcohol use: Yes     Alcohol/week: 14.0 standard drinks     Types: 14 Cans of beer per week   ? Drug use: Not on file   ? Sexual activity: Not on file   Other Topics Concern   ? Not on file   Social History  Narrative   ? Not on file     Social Determinants of Health     Financial Resource Strain:    ? Difficulty of Paying Living Expenses:    Food Insecurity:    ? Worried About Programme researcher, broadcasting/film/video in the Last Year:    ? Barista in the Last Year:    Transportation Needs:    ? Freight forwarder (Medical):    ? Lack of Transportation (Non-Medical):    Physical Activity:    ? Days of Exercise per Week:    ? Minutes of Exercise per Session:    Stress:    ? Feeling of Stress :    Social Connections:    ? Frequency of Communication with Friends and Family:    ? Frequency of Social Gatherings with Friends and Family:    ? Attends Religious Services:    ? Active Member of Clubs or Organizations:    ? Attends Banker Meetings:    ? Marital Status:    Intimate Partner Violence:    ? Fear of Current or Ex-Partner:    ? Emotionally Abused:    ? Physically Abused:    ? Sexually Abused:        InPatient Medications  Current Facility-Administered Medications   Medication Dose Route Frequency Last Rate   ? acetaminophen  650 mg Per G Tube Q6H PRN     ? albuterol  2.5 mg Nebulization Q6H WA     ? ascorbic acid  250 mg Per G Tube Daily     ? chlorhexidine gluconate  15 mL Mouth/Throat Q12H     ? clonazePAM  0.5 mg Per J Tube BID     ? dexAMETHasone  8 mg Per J Tube Q12H     ? insulin regular human 100 units in sodium chloride 0.9 % 100 mL   Intravenous Continuous Stopped (10/29/20 1051)    And   ? dextrose injection  25 g IV Push Q15 MIN PRN      And   ? dextrose injection  12.5 g IV Push Q15 MIN PRN     ? doxepin  10 mg Per J Tube Nightly     ? folic acid  1 mg Per G Tube Daily     ? heparin (porcine)  7,500 Units Subcutaneous Q8H     ? HYDROmorphone         ? HYDROmorphone  0.5 mg IV Push Q4H PRN     ? insulin glargine  20 Units Subcutaneous Nightly     ? insulin glargine  20 Units Subcutaneous Daily     ? [Held by provider] insulin regular human  28 Units Subcutaneous Q6H     ? LORazepam  0.5 mg IV Push Q6H PRN     ? norepinephrine  0.02-3 mcg/kg/min (Dosing Weight) Intravenous Continuous 0.02 mcg/kg/min (10/29/20 1100)   ? oxyCODONE  10 mg Per G Tube Q4H PRN      Or   ? oxyCODONE  15 mg Per G Tube Q4H PRN     ? piperacillin-tazobactam  4.5 g Intravenous Q6H 33.33 mL/hr at 10/29/20 1100   ? propofol (DIPRIVAN) 1,000 mg in 100 mL (10 mg/mL)  5-80 mcg/kg/min Intravenous Continuous 30 mcg/kg/min (10/29/20 1110)   ? Propofol - Injection         ? rocuronium         ? thiamine  50 mg Per G Tube Daily     ?  vancomycin  2 g Intravenous Once     ? [START ON 10/30/2020] Vancomycin MAR level   Intravenous Once     ? Vancomycin Therapy Placeholder   Intravenous placeholder     ? zinc oxide  1 Application Topical (Top) DAILY PRN         [email or web address]    Palliative Performance Scale 30%   (Modification of Karnofsky and was designed for measurement of physical status in Palliative Care. Only 10% of patients with PPS score <=50% would be expected to survive >6 months)    Physical Exam  Vitals and nursing note reviewed.   Constitutional:       General: He is not in acute distress.     Appearance: He is ill-appearing.      Interventions: He is sedated.   Pulmonary:      Effort: No respiratory distress.      Comments: Trached to vent  Neurological:      Comments: Sedated         Vitals:  Temp:  [97.34 ?F (36.3 ?C)-100.22 ?F (37.9 ?C)] 100.04 ?F (37.8 ?C)  Pulse:  [71-120] 103  Resp:  [11-62] 16  BP: (93-143)/(39-109) 95/64  SpO2:  [64 %-100 %] 100 %  ECOG Performance Status (non-calculated): 3  calc. ECOG (from Karnofsky): 2  Wt Readings from Last 3 Encounters:   10/28/20 (!) 144.9 kg   10/06/20 (!) 139.5 kg   09/29/20 (!) 142.4 kg       Review of Labs/Diagnostics  Complete Blood Count:  Recent Labs   Lab 10/27/20  0403 10/28/20  0516 10/29/20  0412   WBC 7.3 12.3* 14.3*   HGB 6.9* 8.2* 9.0*   HCT 22.60* 25.90* 28.70*   PLT 144* 202 226     Comprehensive Metabolic Panel:  Recent Labs   Lab 10/27/20  0403 10/27/20  0552 10/28/20  0516 10/28/20  0604 10/29/20  0412 10/29/20  0503 10/29/20  1051   NA 139  --  141  --  144  --   --    K 5.7*  --  4.4  --  5.3  --   --    CL 101  --  104  --  105  --   --    CO2 24  --  22  --  23  --   --    BUN 60*  --  70*  --  68*  --   --    CREATININE 2.41*  --  2.43*  --  2.21*  --   --    GLU 377*   < > 126*   < > 131*   < > 91   CALCIUM 8.7*  --  9.3  --  8.8  --   --     < > = values in this interval not displayed.     No results for input(s): ALKPHOS, BILITOT, BILIDIR, PROT, ALT, AST in the last 168 hours.    Invalid input(s): LABALBU    Diagnostics:  XR Chest PA or AP    Result Date: 10/29/2020  XR CHEST PA OR AP Date: 10/29/2020 9:23 AM INDICATION: evaluate for PNA. COMPARISON: Yesterday FINDINGS: Support Devices: Tracheostomy tube and right IJ chemotherapy port remain in place. The cardiomediastinal silhouette is stable. Worsening fluid overload. Patchy opacity in the right upper lung may represent focal edema versus pneumonia. Layering left pleural effusion. There is no pneumothorax.     Worsening fluid overload. Patchy  opacity in the right upper lung may represent focal edema versus pneumonia. Layering left pleural effusion. Reported And Signed By: Maryjane Hurter, MD  [facility] Radiology and Biomedical Imaging    XR Chest PA or AP    Result Date: 10/28/2020  XR CHEST PA OR AP Date: 10/28/2020 9:09 AM INDICATION: increased suctioning requirements, r/o pneumonia COMPARISON: XR CHEST PA OR AP 2022-Feb-20 FINDINGS: Support Devices: Unchanged right chest port. Tracheostomy tube 5.2 cm above carina. Low lung volumes with crowding of bronchovascular structures. Small, left greater than right pleural effusion. There is bibasilar atelectasis. No pneumothorax. Stable appearance to the cardiomediastinal silhouette although transversely augmented due to low lung volumes.     Small, left greater than right pleural effusion with accompanying bibasilar atelectasis. Reported And Signed By: Maudie Flakes, MD  [facility] Radiology and Biomedical Imaging        Impression/Plan:   Arthur [name] is a 55 y.o. 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 Admitted to SICU on 2/9 for ventilator management following RRT on oncology floor for desaturation and increased work of breathing while on trach mask, ultimately placed back on vent. Referred for Palliative Care Consult by Attending Provider: Alric Quan, MD [phone] for supportive care.    Cancer-related pain:  - Tylenol 650 mg q6hr PRN mild pain  - Oxycodone 10 mg q4hr PRN moderate pain OR oxycodone 15 mg q4hr PRN severe pain  - Dilaudid 0.5 mg IV q4hr PRN for pain refractory to oxycodone    Anxiety/Agitation  - Defer management to Psych/SICU team given refractory agitation requiring Propofol    Coping/Support:   - Ongoing assessments for psychosocial and/or spiritual needs  - Full interdisciplinary team support available as needed  - Appreciate primary team advocating with nurse management for an exemption to visitor restrictions to allow 2 visitors, as patient's sister reports mobility challenges making visitation on her own difficult  - Appreciate primary team and nursing helping facilitate communication between patient and his sister and niece    Advance Care Planning:  - Full code  - No advance directives or health care proxy documentation on file  - His sister and niece are listed as emergency contacts and his sister is next of kin and default proxy decision maker in the event that patient lacks capacity    Goals of Care:   - Ongoing discussions. Family aware of concern for worsening condition and overall poor prognosis, do not want to see him suffer.     Thank you for involving the Palliative Care team in the care of this patient. The Palliative Care team will continue to follow.    Patient was evaluated: in person    Time committed: > 35 minutes were spent in chart review, coordination of care, and at the bedside in patient/family counseling and education, including detailed concepts of symptom managment and goals of care/advanced care planning.    Signed:  Debbrah Alar, APRN  Palliative Care Consult Service  10/29/2020 12:53 PM

## 2020-10-29 NOTE — Plan of Care
[facility]		Location: 73 SICU/7112-A54 y.o., male			Attending: Alric Quan, MD	Admit Date: 10/09/2020		[id number] LOS: 20 days FOLLOW UP NUTRITION ASSESSMENT DIET ORDER: Tube Feed With Tray --> Vital AF 1.2 @ 95 mL/hr with Full Liquid diet (okay for ice cream, ice chips and sipsANTHROPOMETRICS:Height: 74Admit wt: 136.2 kg, BMI: 38.6 kg/m^2Current wt: 144.9 kg, BMI: 41 kg/m^2Dosing wt (IBW): 95 kgAdditional wt information:Ht confirmed with pt: yes. Based on EMR wt hx, pt down 8.4 kg (5.8%) over the past three months, not considered significant for time frame. Fluctuations in wt may be r/t fluid status vs scale variance. Per flowsheets, 2-3+ (mild-moderate) edema present. Will continue to follow. Wt hx with the lowest wt from each available month over the past year: 09/22/20: 137.8 kg12/01/21: 143.2 kgESTIMATED NUTRITION REQUIREMENTS:Kcal/day: 2850	(30 kcal/kg)Protein/day: 162	(1.7 gm/kg) Fluids/day: Fluid management per medical team discretion. ~2580 mL (30 mL/kcal) Needs based on: dosing wt (95 kg), CA w/mets, AKI/CKD, PNA, pressor, leukocytosis NUTRITION ASSESSMENT: Chart reviewed for f/u. Pt seen trached to vent and sedated (propofol @ 29.9 mL/hr which provides 789 kcal/d). TF infusing @ 95 mL/hr (goal rate). Pt has received 92% of goal enteral volume since last assessment. Diet advanced on 2/21 to allow for ice cream, ice chips, and sips for palliative po intake. DM team on board for insulin management. Suggest cutting back on kcal due to propofol provisions. New recommendations outlined below. D/w Junious Silk, permission given to this writer to adjust order per protocol. Change relayed to DM team. Nutrition will continue to follow throughout admission. Cultural/Religious/Ethnic Needs: NoFood Allergies: None per pt NUTRITION DIAGNOSIS:Inadequate oral intake related to medical status as evidenced by NPO. --- continues INTERVENTIONS/RECOMMENDATIONS: Meals and Snacks:- Diet advancement per team/SLP --> currently ice cream, ice chips, and sips Goal: Patient will have diet advanced or remain on nutrition support prior to next nutrition assessment. --- met, continuesEnteral Nutrition:On propofol: - Peptamen VHP @ 85 mL/hr x 24 hrs - Total provides: 2040 kcal (2829 kcal w/current propofol rate), 188 gm protein, 155 gm CHO (6.5 gm/hr), and 1714 mL free H2O- Additional free water boluses per team *Please reconsult if propofol rate changes significantly Off propofol: - Vital AF 1.2 @ 95 mL/hr x 24 hrs - Total provides: 2736 kcal, 171 gm protein, 252 gm CHO (10.5 gm/hr), and 1849 mL free H2O- Additional free water boluses per team Goal: Patient will receive >90% of goal volume TF prior to next nutrition assessment. --- met, continuesDischarge Planning and Transfer of Nutrition Care:  Nutrition related discharge needs still being determined at this time, will continue to follow.MONITORING / EVALUATION:Food/Nutrition-Related History:Food and Nutrient IntakeEnteral Nutrition IntakeAnthropometric Measurements:Weight Biochemical Data, Medical Tests and Procedures:Electrolyte and renal profileGlucose/endocrine profileNutrition-Focus Physical FindingsOverall findingsElectronically signed by Suzie Portela, MS, RD, CNSC, CDN on February 24, 2022[facility]: [phone] note: Nutrition is a consult only service on weekends and holidays.  Please enter a consult in EPIC if assistance is needed on a weekend or holiday or via [facility] [phone] to contact the covering RD.

## 2020-10-29 NOTE — Progress Notes
Otolaryngology Progress NoteDate: 10/29/2020 Interim History- Family meeting today; ENT to try to attendVitalsTemp:  [36.3 ?C-37.9 ?C] 37.9 ?CPulse:  [71-111] 109Resp:  [11-62] 39BP: (93-120)/(39-81) 93/39SpO2:  [91 %-100 %] 99 %Device (Oxygen Therapy): mechanical ventilatorI/O last 3 completed shifts:In: 4331.2 [I.V.:2146.2; NG/GT:2185]Out: 1780 [Urine:1780]Physical ExamGen: alert, vented PCEyes: open, normal external appearanceFace: grossly symmetricNose: clear anteriorlyOP/OC: hemostaticNeck: Bivona 8 cuffed secured w/soft trach ties, necrotic but hemostatic mass surrounding the stoma, remaining neck flat, firm tumor Pulm: no increased WOB on TMDiagnostic DataReviewed, per chartAssessment & Plan54 M with PMH of HTN, HLD, DM, tobacco and EtOH use, Waldenstrom macroglobulinemia, 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 Admitted to SICU for ventilatory support. No acute ENT intervention planned; no curative or meaningful surgical options at this time. Course complicated by multiple episodes of Bivona trach occlusion/obstruction.  - will attempt for family meeting attendance today- no ENT intervention planned at this time; case discussed at tumor board 2/14 and is not a surgical candidate - disease management/goals-of-care best pursued via non-surgical means; eg, Medical Oncology, Radiation Oncology, Palliative Care- transfer to Medical Oncology floor when eligible - continue routine trach care with careful suctioning- ensure trach well secured with Karn Cassis, MDPGY-1, Anesthesiology

## 2020-10-29 NOTE — Plan of Care
Plan of Care Overview/ Patient Status    This AM, pt tachypneic to 50s, using abdominal muscles, appearing very uncomfortable.  O2 sat 100% and was receiving appropriate Vt.  In addition, pt was febrile, tachycardic, hypotensive.  1L LR fluid bolus given, as well as tylenol and pain meds.  Blood cultures were drawn - during phlebotomy, pt reached up and pulled trach out, decannulating himself.  Attending MD to bedside immediately, trach ultimately reinserted.  Placement confirmed by ENT.  Since that time, trach retracted itself approx 4 cm and was repositioned by SICU PA this afternoon.  Propofol gtt continues for agitation, ventilator compliance.  Restraints placed for airway protection.  Pain meds increased, given with moderate effect.  Ativan given x 1 with minimal to no effect.  HR this AM to 110s, now in the 80s-90s.  Levophed started to maintain MAP >65, currently on hold.  Fever improved.  Antibiotics started.  Copious oral secretions (yellow milky) and moderate to large tracheal secretions (brown/tan, purulent, malodorous).  Xeroform to trach site, which is open and denuded.  TF via PEG, will change to Peptamen this evening.  BM x 1.  Urine output per flowsheet.  Skin as documented.  Secura requested and ordered for incontinence dermatitis. Insulin gtt per protocol.

## 2020-10-29 NOTE — Progress Notes
Inpatient Diabetes Management Team Follow-up Note    Over the past 24 hours, the patient's glucose control has been at goal while on insulin infusion     ? Current Nutritional Status:  Continuous tube feeding with Vital AF 1.2 @ 95 cc/hr PLUS Full liquid diet ie ice crean, ice chips and sips   ? Current Anti-hyperglycemic Regimen:  Insulin infusion per protocol, glargine 20 units qHS, glargine 20 units qAM, HELD regular insulin 28 units q 6 hours  ?  Home Anti-hyperglycemic Regimen: Toujeo U300 64 units daily, Apidra 20 units TID with meals, metformin 1000 mg bid   ? Other Contributing Medications:   ? Dexamethasone 8 mg q12 hours starting the evening of 2/22  ? Levophed ordered to start this morning    PE:    Patient laying in bed in NAD  Temp:  [97.34 ?F (36.3 ?C)-100.22 ?F (37.9 ?C)] 100.22 ?F (37.9 ?C)  Pulse:  [71-111] 109  Resp:  [11-62] 39  BP: (93-120)/(39-81) 93/39  SpO2:  [91 %-100 %] 99 %  Device (Oxygen Therapy): mechanical ventilator     Wt Readings from Last 3 Encounters:   10/28/20 (!) 144.9 kg   10/06/20 (!) 139.5 kg   09/29/20 (!) 142.4 kg       Data Review:  BGs:      Creatinine (mg/dL)   Date Value   16/06/9603 2.21 (H)     Hemoglobin A1c (%)   Date Value   10/10/2020 9.0 (H)       Assessment:  55 yo h/o type 2 diabetes, HTN, Waldenstroms macroglobulinemia, ETOH, laryngeal SCC s/p trach/PEG presented with SOB significant for tumor invading the trach stoma with partial obstruction and he was started on palliative chemo radiation. Course complicated by multiple episodes of Bivona trach occulusion/obstruction.   Patient currently admitted to the SICU for ventilatory support. Diabetes team consulted for assistance in glycemic management.      BG over the past 24 hours have been at goal while on the insulin infusion. He was started on IV antibiotics today due to concern for pneumonia and plan for family today as well.  He remains on dexamethasone and discussed with team will plan to continue with the insulin infusion while on high dose dexamethasone and once it is discontinued will transition back to standing regular insulin for the tube feeds.  Given patient on continuous tube feedings, target blood glucose between 140mg /dl-180mg /dL.  If bolus regular insulin is administered and TFs are held patient is at risk of developing hypoglycemia. Please check BG q1 hour x6 hours and start IV fluids with D5 if BG < 100 mg/dL    Recommendations:   1. Continue insulin infusion per protocol  2. Continue glargine 20 units qAM  3. Continue glargine 20 units qHS  4. HOLD regular insulin 28 units q 6 hours while on the insulin gtt   5. HOLD regular insulin mid dose sliding scale q 6 hours while on the insulin gtt  6. Continue blood glucose monitoring per protocol  7. Diet: tube feeds + full liquids per primary team      Please let us know if you have any questions or concerns about our recommenations. We will continue to follow the patient along with you during his hospitalzation as we attempt to optimize his glycemic control.  Detailed recommendations were communicated to the primary team both verbally and/or through this written note.  I spent a total of 20 minutes with the patient of which 15  minutes (over 50%) was spent in counseling the patient regarding diabetes management and coordinating care with the primary team.     Signed:  Everlene Farrier, APRN  Inpatient Diabetes Management Team   [facility]: [phone]  Diabetes consult pager # [phone]      NOTE: During nights, weekends, and holidays, please contact the on-call Endocrine Fellow at pager [phone].

## 2020-10-29 NOTE — Progress Notes
[facility]	SICU Progress NoteAttending Provider: Alric Quan, MDPost-Operative Day/Post Injury Day Procedure(s):2/5: [name] exchange (ENT) Hospital LOS: 20 days Interim History:  HPI: 55 y.o. male admitted to the SICU on 2/9 for ventilator management following an RRT on the oncology floor for desaturation and increased work of breathing while on trach mask and ultimately placed back on vent. ?Patient was initially a transfer from [facility] on 2/4 after trach and PEG when he had a partial obstruction of trach site from a new necrotic mass. He underwent trach exchange 2/5. Was on trach mask on oncology floor however had 2 episodes on floor requiring RRT for lavage and likely mucous plugging. ?Course Complicated by: - Incidental R IJ thrombus - Multiple episodes of presumed mucous plugging requiring RRT's ?PMH: Chronic ITP, Waldenstrom macroglobulinemia, HTN. HLD, DM, tobacco and alcohol use, Laryngeal SCC (weekly Cisplatin and RT)?PSH: None known ?Home meds: Lipitor, Pepcid, Lisinopril, Metformin, Oxycodone, miralax, Insulin U100 and Lantus SICU course:2/9: Arrives to SICU HDS, on vent PCV. Appears well. Transitioned to Pressure support on vent and tolerating well. O/N: Episode of patient feeling like not getting a full breath, extremely tachypenic and increased work of breathing and tachycardia, attempted suctioning and lavage without improvement, attempted to switch to PCV and VAC but due to dyssynchrony unable to tolerate, no desat but poor ventilation and TV and ETCO2 up to 100. ENT at bedside do not feel additional scope would benefit as he was just scoped a few hours ago, ultimately gave fentanyl push and started propofol for vent synchrony but despite max prop still dyssynchronous. Gave one dose of roccuronium once sedated for vent synchrony with good effect. Albuterol and HTS neb given and peaks improved from 50 to 30's. No further paralytic needed and now synchronous on VAC. Kept on propofol for vent synchrony. CXR reassuring and back to GCS 11T. Hypotension only following sedation and Rocc, Low dose Neo started. 2/10: Kept on higher sedation in AM for Bude A/P (for staging) and soft tissue neck. Weaning sedation and will plan for PSV trial once more awake. Scoped by ENT in AM, airway patent. Q1-2hr suction requirements of thick tan secretions. No further episodes of respiratory distress although sedation remains high. Tube feeds changed to continuous given significant hyperglycemia. Insulin regimen changed per diabetes team recommendations. Pressure ulceration noted around tracheostomy --> ENT at bedside to evaluation, likely some component of invasive tumor growth. Palliative care consult for assist with symptom management and family moving forward. Primary oncologist office made aware of referral. O/N: weaning down on Prop, no episodes of respiratory distress or desats overnight. Febrile to 101.3, sputum and blood cultures ordered. 500cc LR bolus given for some oliguria and rising Cr. 2/11: Given 500cc NS for oliguria with good response. Vanco level yesterday 25, likely cause of worsening aki, on hold. UOP & lytes remains stable. Insulin adjusted per diabetes team. PSV as tolerated (doing well). CPT added. Confirmed with ENT pulm recs for blue portex suctionaid trach- no plans to switch at this time. Bowel regimen added. Plan keep on neo if low dose but if inc, consider changing to levo as likely then not in setting of sedation. Family updated on phone. Confirmed w heme- no AC rec for R IJ thrombus until plt > 50. Was going to attempt to place pt on TM overnight as he did very well on PSV x 3hrs, however, pt had anxiety attack regarding his cancer and prognosis, he requested to sleep during night and was agreeable to TM in  am. O/N: Patient agrees to PSV overnight which he did very well on. Agreeable to getting OOB to chair and possibly trach mask in AM but wants to stay on vent tonight. Off Neo by AM.  2/12: trial trach mask as tolerated this AM. Adjusted insulin regimen per the diabetes management service. Sputum now with staph, awaiting sensitivities. More loose stools today; nursing placed rectal tube and held bowel regimen.  ON: Anxious at times, feeling like he cannot breath but doing well on TM. Frequent suctioning. Increased standing. FIO2 increased to 40%.  insulin to 14U per endo reccs. 2/13: continue to adjust insulin regimen, now 20u regular q6. Cr continues to uptrend 2.57 --> 2.63 and repeat vanco level still elevated 24.6; pharmacy to continue dosing adjustments. Otherwise, remains on trach mask. Sputum with MRSA --> d/c ceftriaxone. ON: increased standing insulin to 24U. 2/14: Will discuss [facility] plan with hematology now that PLT > 50 x2 checks. Will also d/c vancomycin today (10d of therapy thus far). AKI appears to have peaked at 2.57 -> 2.63 -> 2.47. Awaiting transfer to the oncology service on NP 15. Okay to start [facility], but will hold off today given more bloody secretions. 2/15: Plan to start palliative RT today. AM session aborted due to extreme anxiety (despite premedication with IV ativan), tried again in the PM with 10mg  IV Valium + 3mg  IV ativan, procedure again aborted due to extreme anxiety and inability to tolerate. Pending discussion with rad onc in AM re: utility of ongoing attempts at RT. Pending transfer to NP15 on med onc service.  O/N: increasingly anxious / restless, but able to re-direct and calm down with PRN ativan. Liquid stools, placed rectal tube. Will start banatrol and check CBC in AM, check c dif. This had previously improved by holding bowel regimen.2/16: Acutely increased WOB, HR 130s, SBP 170s --> ENT bedside scope with trach patent, though notable secretions --> suctioned with minimal improvement; placed back on ventilator PSV 10 / +5 with immediate improvement in VS and respiratory distress. Precedex gtt started to assist more frequent suctioning. CXR with LLL consolidation --> resend sputum, and restart Vanco (close trough monitoring with AKI) given known MRSA PNA. Increased diarrhea, cdiff negative, transition to Vital TF. LR 500cc for hemoconcentration of labs, tachycardia. Appreciate radiation oncology; sedated patient with Propofol gtt, bolus x1 with good effect --> able to tolerate 15 minute radiation session; plan to repeat Thursday, Friday, Monday, and Tuesday. 2/17: Psych consulted for depression--> Doxepin added, Klonopin added. Hypernatremia--> D5W and free water flushes added. Ceftaz added for GNR in sputum. Plan for family discussion this PM (see separate note) and radiation this PM. Foley replaced for retention. ON: Tolerated PSV 15/5. Repeat Na 148.2/18: Plan for radiation this afternoon. Rectal tube removed.. Hgb 6.3--> transfused 1un PRBCs. Discussed care with sister and niece at bedside, remain full code and plan to continue radiation course through Tuesday (see family communication note).2/19: PSV weaned 15 --> 12/+5. Start Clonidine and discontinue Precedex gtt. Discontinue Gabapentin and Dilaudid IV, decrease Oxycodone to 2.5 / 5. Na improved 144, discontinue D5W; sugars should improve, maintain current regimen per DM team. GNR per micro lab not pseudomonas, klebsiella, or other enteric organism; would have speciated --> change Ceftaz to PO Cipro, to complete course 2/23 for one week. Cr again increased with Vanco level over 20 --> next dose held, monitor level closely. Hgb remains low despite 1U yesterday, 6.3 --> 7.0; start multivitamins and check with Heme. ON: Low grade fevers stable. Given ativan  0.5mg  in place of klonipin with good anxiolytic effect. Transfuse 1u PRBC (likely half unit) for Hgb 6.7.2/20: Acute respiratory distress this morning after turn, minimal secretions, did not improve with increased pressure or VAC --> bagged x5 mins with improvement, placed on PCV, IP 20 --> CXR pending, start Propofol, add back Ativan and Morphine PRN. Appreciate heme assistance with persistent anemia. Cr continues to rise following elevated vanco trough; hold any further doses, level will likely remain therapeutic to final date of course (2/22) --> unfortunately, patient did receive additional dose of Vanco 2g per pharmacy order this morning; discontinued placeholder, consider Doxycycline if Vanco level subtherapeutic prior to end date.ON: Remains on PCV. Remained on low dose levo 0.02 throughout night. Manual suctioning by RT overnight with thin tan secretions. AM labs notable for Hgb 9.1(6.7) after 1uPRBC transfusion yesterday. Worsening AKI Cr 2.51 (2.44).2/21: Patient alert and breathing comfortably this morning --> wean PEEP back to 5, consider retrial of PSV today pending radiation plans. Free water flushes discontinued, hypernatremia resolved. Worsening AKI from Vanco; since discontinued, monitor UOP. LR 250cc, wean off Levo gtt. Appreciate palliative care assistance after event yesterday, patient requesting further discussion. Lantus changed to 15U QHS, 10U QAM and bolus increased to 28U Q6.ON: Acute respiratory distress at change of shift, lost tidal volumes and desaturated to high 80s requiring bagging, recovered. Pt expressing that he cannot breathe, feels obstructed --> suctioned with 10Fr suction w/ resistance met at 20cm and minimal secretions. ENT at bedside for scope --> thick secretions noted but airway patent.. Noted that Bivona trach was retracted, advanced to proper position by ENT. Per ENT, tracheal tissue proximal to bivona tip very flimsy. Likely tissue collapsing against distal tip with trach retracted in poor position causing obstruction. Patient acutely distressed and agitated during event, propofol gtt restarted will plan to wean off tonight. Start decadron 8mg  q12 per onc recs for peritumoral inflammation. Hyperkalemic in AM to 5.7 --> EKG unchanged, insulin/dextrose/Ca2+ x 1. Transfuse 1u PRBC for hgb 6.9. 2/22: Keep on PCV today and continue propofol gtt. Start Insulin gtt and increase Lantus to 20u BID. No bowel movements --> discontinue banatrol. Plan for radiation today. Discussed transfer to MICU no beds available, will initiate transfer once a bed becomes available. Plan for multidisciplinary family meeting this week. ON: Rested on PCV, low dose prop. WBC count 12 (7) this am2/23: Plan for last palliative radiation session later today. RR decreased to 12. Given rising white count and increased in line suctioning requirement Sputum culture sent and CXR obtained. Will stop Cipro and start ceftriaxone. 1L LR bolus given for AKI, rising BUN as well as likely concentration on CBC. Plan to start weaning off prop after radiation and attempt PSV trials again. Family discussion with all teams Thursday or Friday. CPT added back given increased secretions attempted to add back HTS nebs however hospital is completely out per pharmacy discussion. Respiratory culture again growing GPC and GNR. ON: BG stable on ins gtt. NAE2/24: This AM, less responsive (GCS 10T) and BP down to 60/40s, but also worsening vent synchrony --> increased oxycodone and dilaudid followed by 1000 LR bolus with good effect and pan-cx given worsening leukocytosis. Started vanc / zosyn.  Later, self d/c bivona trach and desat to 60s; SICU attending at bedside and replaced bivona trach without difficulty and placed back on the ventilator.  ENT at bedside to scope and confirmed trach positioning. Will plan for family meeting tomorrow.Review of Allergies/Meds/Hx: I have reviewed the patient's: allergies, past medical  history, past surgical history, family history, social history, prior to admission medicationsObjective: Vitals:I have reviewed the patient's current vital signs as documented in the EMRTemp:  [97.16 ?F (36.2 ?C)-99.14 ?F (37.3 ?C)] 99.14 ?F (37.3 ?C)Pulse:  [68-104] 104Resp:  [0-32] 14BP: (91-120)/(46-81) 98/79SpO2:  [91 %-100 %] 98 %SpO2: 98 %Device (Oxygen Therapy): mechanical ventilatorI/O's:I have reviewed the patient's current I&O's as documented in the EMR.Gross Totals (Last 24 hours) at 10/29/2020 0449Last data filed at 10/29/2020 0400Intake 4398.27 ml Output 1705 ml Net 2693.27 ml Physical ExamVitals reviewed. Constitutional:     General: Anxious gentleman appearing older than stated age. HENT:    Head: Normocephalic and atraumatic.    Mouth: Mucous membranes are moist.    Extraocular Movements: Extraocular movements intact.    Pupils: Pupils are equal, round, and reactive to light.    Comments: Tracheostomy in placeCardiovascular:    Rate and Rhythm: Regular rhythm. Regular rate   Heart sounds: No murmur heard. Pulmonary:    Breath sounds: No wheezing, rhonchi or rales.    Comments: Ventilated via trachAbdominal:    General: There is no distension.    Tenderness: There is no abdominal tenderness.    Comments: PEG tube in place with TFs runningGenitourinary:   Comments: Foley in place draining clear, yellow urineMusculoskeletal:    Lower legs: Mild edema   Comments: Moves all extremities equally, to command.Skin:   General: Skin is warm and dry.    Capillary Refill: Capillary refill takes less than 2 seconds. Neurological:    General: No focal deficit present.    Mental Status: He is alert.    Cranial Nerves: No cranial nerve deficit.    Motor: No weakness. Labs: Refer to EMR. I have reviewed the patient's labs within the last 24 hours; see assessment and plan below for significant abnormals.Microbiology:Refer to EMR. I have reviewed all new results within the last 24 hours; see assessment and plan below.Diagnostics:Refer to EMR. I have reviewed all new results within the last 24 hours; see assessment and plan below.Chest BJ:YNWGN to EMR. I have reviewed all new results within the last 24 hours; see assessment and plan below.Abdominal FA:OZHYQ to EMR. I have reviewed all new results within the last 24 hours; see assessment and plan below.ECG/Tele Events: I have reviewed the patient's ECG and telemetry as resulted in the EMR.Assessment/Plan Neurologic: Analgesia/sedation - Tylenol 650 Q6 PRN - Oxycodone 10 / 15 Q4 PRN - Dilaudid 0.5 PRN breakthrough- Propofol gtt, wean as able  Anxiety- d/c clonidine taper given hypotension- Clonazepam 0.5mg  BID- Ativan 0.5mg  IV Q6 PRN acute agitation- Doxepin 10mg  QHS- Appreciate palliative care and psych consults?ENT: L laryngeal SCC, invading tracheostomy: s/p trach exchange 10/10/20 and biopsy 10/12/20- Bivona trach to bypass obstruction - Unfortunately, there are no surgical / curative options- Palliative radiation (2/16, 2/17, 2/18, 2/21, 2/22, 2/23); required PCV with full propofol sedation?Cardiac: Hypotension likely in setting of sedation- MAP > 65	- Norepinephrine gtt if needed (2/24- present)Hx of HTN / HLD- Hold home lisinopril, lasix, crestor given AKI?Respiratory:  Respiratory failure (circumferential tumor invasion of stoma, mucous plugging / trach occlusion)- PC/AC: IP 20, PEEP +5- Decadron 8mg  BID started 2/22 -2/24 per med oncology- Albuterol Q6- CPT 4 times daily - Could consider mucomyst nebs- Attempted to add back HTS nebs however hospital is completely out per pharmacy discussionGI: Diarrhea, resolved - PEG prior to admission- TF: Vital @ 95cc/hr; while on propofol --> peptamen at 85cc/hr- Okay for ice cream, sips and chips?Renal: AKI on CKD (baseline Cr 1.5-1.6), 2/2 supratherapeutic  vanco, improving - Cr peaked 2.6, continue to trend daily- Foley replaced 2/17 for urinary retention- Fluid resuscitation as clinically indicated ?Infectious Disease: Concern for new pneumonia- Pan-cx 2/24- Start vanco  (2/24- present)- Start zosyn (2/24- present)Hx MRSA PNA; 4+ GNR PNA- s/p Vanco (2/5 - 2/14, 2/16 - 2/22)- s/p Ceftaz (2/17- 2/19)- s/p Cipro (2/19 - 2/23)?Hematology:Critical illness anemia- Sanguinous secretions, expected given tumor burden and coagulopathy, may worsen with radiation and baseline hematologic disease- Multivitamins and cofactors- Maintain Hgb >7?Hx of chronic ITP, Waldenstrom macroglobulinemia- Transfuse platelets <50 if bleeding, <10 if no active bleeding- Appreciate hematology recommendations?R IJ thrombus - High risk heparin gtt when plt consistently >50 and no more bloody secretions from tracheostomy- Hep SC - Maintain SCDs at all times?Endocrine: Hx T2DM- Lantus 20u BID- Insulin gtt - HOLD home Metformin, Lantus 64u, Apidra 20u TID w meals- Appreciate diabetes team recsMusculoskeletal: Generalized deconditioning- OOB daily, PT/OT evals- Turn and reposition Q2 hours, PUP dressing?Device Review:- R IJ Port (08/12/20) per guidelines, nonocclusive DVT does not require removal of line- PEG (09/14/20)- Bivona trach (10/10/20)?Code Status / Dispo:- Full code - Per ENT, patient to transfer to medical oncology after SICU- Appreciate palliative care assistance with GOC discussionsNotifications : For questions please call SICU Covering Provider in Epic EMR (primary) SICU M[facility] Dynamic Roles (secondary)[facility] 7-1 APP			[phone] 6-1 Frontside		[phone] 6-1 Backside		[phone] Verdi-2 APP			[phone]:Kay Shippy, PA-CSICU APPMHB [phone]/24/2022

## 2020-10-29 NOTE — Telephone Encounter
Attempted to reach patient's sister, [name], for clinical update per her request after discussing with palliative care earlier, but she did not respond.  Subsequent attempts x2 were sent straight to voicemail.  Also tried to contact patient's niece, [name], but did not hear back.Will try again at a later time to update family.Ludger Nutting, PA-CSICU APP

## 2020-10-29 NOTE — Plan of Care
Plan of Care Overview/ Patient Status    Remains sedated on propofol - increased to 42mcg/kg/min due to restlessness & agitation, increasingly agitated at 4am - prn ativan given with minimal effect, tearful at times - emotional support provided, PERRLA - 3mm, moving BUE with 4/5 strength/BLE with 2/5, prn oxycodone givenx1 for signs of pain via CPOT scale, afebrile, remains in sinus rhythm with rates 80s-100s, MAP goal maintained >60 with systolic BP 90s-110s, trached - Bivona #8, remains on pressure A/C settings, frequent suctioning q30mins - q1hr, inline secretions tan/thick occasionally blood-tinged, +strong cough, lungs diminished throughout, xeroform under trach site replaced, abdomen distended, +BMx1 - incontinence care provided, Full-strength Vital tube feeds infusing at 83mL/hr (goal), foley draining adequate amounts clear/yellow urine 50-132mL/hr, insulin gtt tirated per protocol, turned/repositioned q2hrs and for comfort, call bell within reach, contact precautions maintained, see flowsheets for additional informationCarrie Arianne Klinge, RN 4:35 AM

## 2020-10-30 LAB — BASIC METABOLIC PANEL
BKR BLOOD UREA NITROGEN: 72 mg/dL — ABNORMAL HIGH (ref 6–20)
BKR BUN / CREAT RATIO: 33.3 — ABNORMAL HIGH (ref 8.0–23.0)
BKR CALCIUM: 8.6 mg/dL — ABNORMAL LOW (ref 8.8–10.2)
BKR CHLORIDE: 106 mmol/L (ref 98–107)
BKR CREATININE: 2.16 mg/dL — ABNORMAL HIGH (ref 0.40–1.30)
BKR EGFR (AFR AMER): 39 mL/min/{1.73_m2} (ref >60)
BKR EGFR (NON AFRICAN AMERICAN): 32 mL/min/{1.73_m2} (ref >60)
BKR GLUCOSE: 141 mg/dL — ABNORMAL HIGH (ref 70–100)
BKR POTASSIUM: 5 mmol/L (ref 3.3–5.3)
BKR SODIUM: 142 mmol/L (ref 136–144)

## 2020-10-30 LAB — CBC WITH AUTO DIFFERENTIAL
BKR WAM ABSOLUTE IMMATURE GRANULOCYTES.: 0.43 x 1000/ÂµL — ABNORMAL HIGH (ref 0.00–0.30)
BKR WAM ABSOLUTE LYMPHOCYTE COUNT.: 0.23 x 1000/ÂµL — ABNORMAL LOW (ref 0.60–3.70)
BKR WAM ABSOLUTE NRBC (2 DEC): 0 x 1000/ÂµL (ref 0.00–1.00)
BKR WAM ANALYZER ANC: 9.86 x 1000/ÂµL — ABNORMAL HIGH (ref 2.00–7.60)
BKR WAM BASOPHIL ABSOLUTE COUNT.: 0.02 x 1000/ÂµL (ref 0.00–1.00)
BKR WAM BASOPHILS: 0.2 % (ref 0.0–1.4)
BKR WAM EOSINOPHIL ABSOLUTE COUNT.: 0.17 x 1000/ÂµL (ref 0.00–1.00)
BKR WAM EOSINOPHILS: 1.4 % (ref 0.0–5.0)
BKR WAM HEMATOCRIT (2 DEC): 28.6 % — ABNORMAL LOW (ref 38.50–50.00)
BKR WAM HEMOGLOBIN: 8.9 g/dL — ABNORMAL LOW (ref 13.2–17.1)
BKR WAM IMMATURE GRANULOCYTES: 3.7 % — ABNORMAL HIGH (ref 0.0–1.0)
BKR WAM LYMPHOCYTES: 2 % — ABNORMAL LOW (ref 17.0–50.0)
BKR WAM MCH (PG): 27.1 pg (ref 27.0–33.0)
BKR WAM MCHC: 31.1 g/dL (ref 31.0–36.0)
BKR WAM MCV: 87.2 fL (ref 80.0–100.0)
BKR WAM MONOCYTE ABSOLUTE COUNT.: 1.03 x 1000/ÂµL — ABNORMAL HIGH (ref 0.00–1.00)
BKR WAM MONOCYTES: 8.8 % (ref 4.0–12.0)
BKR WAM MPV: 11.1 fL (ref 8.0–12.0)
BKR WAM NEUTROPHILS: 83.9 % — ABNORMAL HIGH (ref 39.0–72.0)
BKR WAM NUCLEATED RED BLOOD CELLS: 0.2 % (ref 0.0–1.0)
BKR WAM PLATELETS: 183 x1000/ÂµL (ref 150–420)
BKR WAM RDW-CV: 17.8 % — ABNORMAL HIGH (ref 11.0–15.0)
BKR WAM RED BLOOD CELL COUNT.: 3.28 M/ÂµL — ABNORMAL LOW (ref 4.00–6.00)
BKR WAM WHITE BLOOD CELL COUNT: 11.7 x1000/ÂµL — ABNORMAL HIGH (ref 4.0–11.0)

## 2020-10-30 LAB — MAGNESIUM: BKR MAGNESIUM: 2.6 mg/dL — ABNORMAL HIGH (ref 1.7–2.4)

## 2020-10-30 LAB — PHOSPHORUS     (BH GH L LMW YH): BKR PHOSPHORUS: 5.4 mg/dL — ABNORMAL HIGH (ref 2.2–4.5)

## 2020-10-30 LAB — VANCOMYCIN, RANDOM     (BH GH LMW YH): BKR VANCOMYCIN RANDOM: 18.6 ug/mL — ABNORMAL HIGH

## 2020-10-30 LAB — PROCALCITONIN     (BH GH LMW Q YH): BKR PROCALCITONIN: 0.5 ng/mL — ABNORMAL HIGH

## 2020-10-30 MED ORDER — VANCOMYCIN MAR LEVEL
Freq: Once | INTRAVENOUS | Status: CP
Start: 2020-10-30 — End: ?

## 2020-10-30 MED ORDER — MIDAZOLAM (PF) 5 MG/ML INJECTION SOLUTION
5 mg/mL | Freq: Once | INTRAVENOUS | Status: CP
Start: 2020-10-30 — End: ?
  Administered 2020-10-30: 22:00:00 5 mL via INTRAVENOUS

## 2020-10-30 MED ORDER — HYDROMORPHONE 2 MG/ML INJECTION SOLUTION
2 mg/mL | INTRAVENOUS | Status: DC | PRN
Start: 2020-10-30 — End: 2020-11-02
  Administered 2020-10-30 – 2020-11-02 (×2): 2 mL via INTRAVENOUS

## 2020-10-30 MED ORDER — LORAZEPAM 2 MG/ML INJECTION SOLUTION
2 mg/mL | Freq: Four times a day (QID) | INTRAVENOUS | Status: DC | PRN
Start: 2020-10-30 — End: 2020-11-09
  Administered 2020-11-05 – 2020-11-09 (×5): 2 mL via INTRAVENOUS

## 2020-10-30 MED ORDER — MIDAZOLAM (PF) 1 MG/ML INJECTION SOLUTION
1 mg/mL | Status: DC
Start: 2020-10-30 — End: 2020-11-20

## 2020-10-30 MED ORDER — LORAZEPAM 2 MG/ML INJECTION SOLUTION
2 mg/mL | Status: CP
Start: 2020-10-30 — End: ?
  Administered 2020-10-30: 21:00:00 2 mg/mL via INTRAVENOUS

## 2020-10-30 MED ORDER — INSULIN GLARGINE 100 UNIT/ML SUBCUTANEOUS SOLUTION
100 unit/mL | Freq: Two times a day (BID) | SUBCUTANEOUS | Status: DC
Start: 2020-10-30 — End: 2020-10-31
  Administered 2020-10-31 (×2): 100 mL via SUBCUTANEOUS

## 2020-10-30 MED ORDER — LORAZEPAM 2 MG/ML INJECTION SOLUTION
2 mg/mL | Freq: Once | INTRAVENOUS | Status: CP
Start: 2020-10-30 — End: ?
  Administered 2020-10-30: 21:00:00 2 mL via INTRAVENOUS

## 2020-10-30 NOTE — Progress Notes
Inpatient Diabetes Management Team Follow-up Note    Over the past 24 hours, the patient's glucose control has been at goal while on insulin infusion     ? Current Nutritional Status:  Continuous tube feeding with Peptamen Intense @ 85 cc/hr  - Total provides: 2040 kcal (2829 kcal w/current propofol rate), 188 gm protein, 155 gm CHO (6.5 gm/hr), and 1714 mL free H2O  PLUS Full liquid diet ie ice crean, ice chips and sips   ? Current Anti-hyperglycemic Regimen:  Insulin infusion per protocol, glargine 20 units qHS, glargine 20 units qAM, HELD regular insulin 28 units q 6 hours  ?  Home Anti-hyperglycemic Regimen: Toujeo U300 64 units daily, Apidra 20 units TID with meals, metformin 1000 mg bid   ? Other Contributing Medications:   ? Dexamethasone 8 mg q12 hours starting the evening of 2/22-2/24  ? Levophed infusion  ? Propofol infusion    PE:    Patient laying in bed in NAD  Temp:  [97.34 ?F (36.3 ?C)-100.22 ?F (37.9 ?C)] 98.24 ?F (36.8 ?C)  Pulse:  [70-120] 72  Resp:  [9-39] 17  BP: (69-143)/(38-109) 104/50  SpO2:  [64 %-100 %] 100 %  Device (Oxygen Therapy): mechanical ventilator     Wt Readings from Last 3 Encounters:   10/30/20 (!) 147.1 kg   10/06/20 (!) 139.5 kg   09/29/20 (!) 142.4 kg       Data Review:  BGs:      Creatinine (mg/dL)   Date Value   16/06/9603 2.16 (H)     Hemoglobin A1c (%)   Date Value   10/10/2020 9.0 (H)       Assessment:  55 yo h/o type 2 diabetes, HTN, Waldenstroms macroglobulinemia, ETOH, laryngeal SCC s/p trach/PEG presented with SOB significant for tumor invading the trach stoma with partial obstruction and he was started on palliative chemo radiation. Course complicated by multiple episodes of Bivona trach occulusion/obstruction. Patient currently admitted to the SICU for ventilatory support and on IV antibiotics for treatment of pneumonia. Diabetes team consulted for assistance in glycemic management.      BG over the past 24 hours have been at goal while on the insulin infusion and also receiving supplemental glargine dosed twice a day. Last dose of dexamethasone 8 mg was yesterday morning and biological activity of steroids should be clearing. He remains on continuous tube feeding at goal rate. Noted tube feeding changed yesterday afternoon to much lower carb formula. Patient also remains on levophed and propofol infusion. Given much lower insulin gtt rates today advise decrease in glargine doses. Primary team would like to transition patient off the insulin gtt tomorrow when steroids will completely be cleared and we will then plan to start bolus regular insulin as well as correction. Plan discussed with covering provider Ronny Flurry,  PA. Given patient on continuous tube feedings, target blood glucose between 140mg /dl-180mg /dL mg/dL.    Recommendations:   1. Continue insulin infusion per protocol  2. Decrease glargine to 15 units qAM  3. Decrease glargine to 15 units qHS  4. HOLD regular insulin 28 units q 6 hours while on the insulin gtt   5. Continue blood glucose monitoring per protocol  6. Diet: tube feeds + full liquids per primary team      Please let us know if you have any questions or concerns about our recommenations. We will continue to follow the patient along with you during his hospitalzation as we attempt to optimize his glycemic control.  Detailed recommendations were communicated to the primary team both verbally and/or through this written note.  I spent a total of 20 minutes with the patient of which 15 minutes (over 50%) was spent in counseling the patient regarding diabetes management and coordinating care with the primary team.     Signed:  Everlene Farrier, APRN  Inpatient Diabetes Management Team   [facility]: [phone]  Diabetes consult pager # [phone]      NOTE: During nights, weekends, and holidays, please contact the on-call Endocrine Fellow at pager [phone].

## 2020-10-30 NOTE — Plan of Care
Plan of Care Overview/ Patient Status    Remains sedated on propofol - increased to 22mcg/kg/min due to restlessness & agitation, PERRLA - 3mm, moving BUE with 4/5 strength/BLE with 2/5, prn oxycodone givenx2 for signs of pain via CPOT scale, soft wrist restraints in place to prevent patient from pulling at tubes/lines - see restraint flowsheets, afebrile, remains in sinus rhythm with rates 70s-90s, MAP goal maintained >65 levo gtt restarted 0.02-0.04mcg/kg/min, trached - Bivona #8, remains on pressure A/C settings, frequent suctioning q1hr-q2hrs, inline secretions tan/thick occasionally blood-tinged, +strong cough, large amount oral secretions, lungs diminished throughout, xeroform under trach site replaced, abdomen distended, Full-strength tube feeds changed from vital to peptamen infusing at 17mL/hr (goal) through PEG, foley draining adequate amounts clear/yellow urine, insulin gtt tirated per protocol, turned/repositioned q2hrs and for comfort, call bell within reach, contact precautions maintained, see flowsheets for additional informationCarrie Trayonna Bachmeier, RN 4:16 AM

## 2020-10-30 NOTE — Other
[facility] SICUPsychiatry Consult Service Progress Note2/25/2022Patient Name:  Arthur RaleighMRN:  [id number] Patient Class:  InpatientSUBJECTIVE Interim History:  Antibiotics restarted for recurrent pneumonia. Patient remains sedated on propofol for comfort but decreased responsiveness in general. Continues on clonazepam BID. Plan for family meeting to discuss GOC today.Review of Systems Unable to perform ROS: Acuity of condition OBJECTIVE I have reviewed the patient's current medications, allergies, past medical history, past surgical history and social historyCurrent MedicationsCurrent Facility-Administered Medications Medication Dose Route Frequency Last Rate ? acetaminophen  650 mg Per G Tube Q6H PRN   ? albuterol  2.5 mg Nebulization Q6H WA   ? ascorbic acid  250 mg Per G Tube Daily   ? chlorhexidine gluconate  15 mL Mouth/Throat Q12H   ? clonazePAM  0.5 mg Per J Tube BID   ? insulin regular human 100 units in sodium chloride 0.9 % 100 mL   Intravenous Continuous 2 mL/hr at 10/30/20 1000  And ? dextrose injection  25 g IV Push Q15 MIN PRN    And ? dextrose injection  12.5 g IV Push Q15 MIN PRN   ? [Held by provider] doxepin  10 mg Per J Tube Nightly   ? folic acid  1 mg Per G Tube Daily   ? heparin (porcine)  7,500 Units Subcutaneous Q8H   ? HYDROmorphone  0.5 mg IV Push Q4H PRN   ? insulin glargine  20 Units Subcutaneous Nightly   ? insulin glargine  20 Units Subcutaneous Daily   ? [Held by provider] insulin regular human  28 Units Subcutaneous Q6H   ? miconazole nitrate   Topical (Top) BID   ? norepinephrine  0.02-3 mcg/kg/min (Dosing Weight) Intravenous Continuous 0.06 mcg/kg/min (10/30/20 1000) ? oxyCODONE  10 mg Per G Tube Q4H PRN    Or ? oxyCODONE  15 mg Per G Tube Q4H PRN   ? piperacillin-tazobactam  4.5 g Intravenous Q6H 33.33 mL/hr at 10/30/20 0700 ? propofol (DIPRIVAN) 1,000 mg in 100 mL (10 mg/mL)  5-80 mcg/kg/min Intravenous Continuous 40 mcg/kg/min (10/30/20 1000) ? rocuronium       ? thiamine  50 mg Per G Tube Daily   ? Vancomycin Therapy Placeholder   Intravenous placeholder   ? zinc oxide  1 Application Topical (Top) DAILY PRN   Vital SignsTemp: 99.32 ?F (37.4 ?C)Pulse: 90Resp: (!) 40 (trach moved out)BP: 125/70SpO2: 100 %Mental Status ExamPatient sedated on propofol 40 mcg/kg/min at time of evaluationOpens eyes to voice, nods appropriately to questions, unable to write answersSqueezes hand appropriately to command bilaterally, wiggles toes to command bilaterallyLab Results (last 24 hours)Recent Results (from the past 24 hour(s)) POC Glucose (Fingerstick)  Collection Time: 10/29/20 10:51 AM Result Value Ref Range  Glucose, Meter 91 70 - 100 mg/dL POC Glucose (Fingerstick)  Collection Time: 10/29/20  1:07 PM Result Value Ref Range  Glucose, Meter 138 (H) 70 - 100 mg/dL Lower respiratory culture, qualitative  Collection Time: 10/29/20  1:33 PM  Specimen: Sputum; Culture Result Value Ref Range  Gram Stain (Primary) 3+ WBC's   Gram Stain (Primary) 4+ Gram negative rods   Gram Stain (Primary) 3+ Mixed flora  POC Glucose (Fingerstick)  Collection Time: 10/29/20  2:59 PM Result Value Ref Range  Glucose, Meter 200 (H) 70 - 100 mg/dL POC Glucose (Fingerstick)  Collection Time: 10/29/20  4:02 PM Result Value Ref Range  Glucose, Meter 209 (H) 70 - 100 mg/dL POC Glucose (Fingerstick)  Collection Time: 10/29/20  5:05 PM Result Value Ref Range  Glucose, Meter 199 (H) 70 - 100 mg/dL Urinalysis-macroscopic w/reflex microscopic  Collection Time: 10/29/20  5:48 PM Result Value Ref Range  Clarity, UA Cloudy (A) Clear  Color, UA Yellow Yellow  Specific Gravity, UA 1.018 1.005 - 1.030  pH, UA 6.0 5.5 - 7.5  Protein, UA Trace Negative-Trace  Glucose, UA Negative Negative  Ketones, UA Negative Negative Blood, UA 1+ (A) Negative  Bilirubin, UA Negative Negative  Leukocytes, UA Positive (A) Negative  Nitrite, UA Negative Negative  Urobilinogen, UA <2.0 <=2.0 EU/dL Urine microscopic     (BH GH LMW YH)  Collection Time: 10/29/20  5:48 PM Result Value Ref Range  Manual Microscopic Performed   Epithelial Cells Few None-Few /LPF  Hyaline Casts, UA 0-3 0 - 3 /LPF  WBC/HPF 3-5 0 - 5 /HPF  RBC/HPF 3-5 (A) 0 - 2 /HPF  Bacteria, UA Few None-Few /HPF  Urate Crystals, UA Moderate (A) None /HPF POC Glucose (Fingerstick)  Collection Time: 10/29/20  5:54 PM Result Value Ref Range  Glucose, Meter 186 (H) 70 - 100 mg/dL POC Glucose (Fingerstick)  Collection Time: 10/29/20  7:09 PM Result Value Ref Range  Glucose, Meter 145 (H) 70 - 100 mg/dL POC Glucose (Fingerstick)  Collection Time: 10/29/20  8:08 PM Result Value Ref Range  Glucose, Meter 169 (H) 70 - 100 mg/dL POC Glucose (Fingerstick)  Collection Time: 10/29/20  8:45 PM Result Value Ref Range  Glucose, Meter 177 (H) 70 - 100 mg/dL POC Glucose (Fingerstick)  Collection Time: 10/29/20  9:55 PM Result Value Ref Range  Glucose, Meter 143 (H) 70 - 100 mg/dL POC Glucose (Fingerstick)  Collection Time: 10/29/20 10:57 PM Result Value Ref Range  Glucose, Meter 133 (H) 70 - 100 mg/dL POC Glucose (Fingerstick)  Collection Time: 10/30/20 12:01 AM Result Value Ref Range  Glucose, Meter 110 (H) 70 - 100 mg/dL POC Glucose (Fingerstick)  Collection Time: 10/30/20  1:04 AM Result Value Ref Range  Glucose, Meter 125 (H) 70 - 100 mg/dL POC Glucose (Fingerstick)  Collection Time: 10/30/20  1:57 AM Result Value Ref Range  Glucose, Meter 116 (H) 70 - 100 mg/dL POC Glucose (Fingerstick)  Collection Time: 10/30/20  3:19 AM Result Value Ref Range  Glucose, Meter 126 (H) 70 - 100 mg/dL Magnesium  Collection Time: 10/30/20  3:58 AM Result Value Ref Range  Magnesium 2.6 (H) 1.7 - 2.4 mg/dL Phosphorus     (BH GH L LMW YH)  Collection Time: 10/30/20  3:58 AM Result Value Ref Range  Phosphorus 5.4 (H) 2.2 - 4.5 mg/dL Type and screen  Collection Time: 10/30/20  3:58 AM Result Value Ref Range  ABO Grouping O   Rh Type POS   Antibody Screen NEG  CBC auto differential  Collection Time: 10/30/20  3:58 AM Result Value Ref Range  WBC 11.7 (H) 4.0 - 11.0 x1000/?L  RBC 3.28 (L) 4.00 - 6.00 M/?L  Hemoglobin 8.9 (L) 13.2 - 17.1 g/dL  Hematocrit 16.10 (L) 96.04 - 50.00 %  MCV 87.2 80.0 - 100.0 fL  MCH 27.1 27.0 - 33.0 pg  MCHC 31.1 31.0 - 36.0 g/dL  RDW-CV 54.0 (H) 98.1 - 15.0 %  Platelets 183 150 - 420 x1000/?L  MPV 11.1 8.0 - 12.0 fL  Neutrophils 83.9 (H) 39.0 - 72.0 %  Lymphocytes 2.0 (L) 17.0 - 50.0 %  Monocytes 8.8 4.0 - 12.0 %  Eosinophils 1.4 0.0 - 5.0 %  Basophil 0.2 0.0 - 1.4 %  Immature Granulocytes 3.7 (H) 0.0 - 1.0 %  nRBC 0.2 0.0 - 1.0 %  ANC(Abs Neutrophil Count) 9.86 (H) 2.00 - 7.60 x 1000/?L  Absolute Lymphocyte Count 0.23 (L) 0.60 - 3.70 x 1000/?L  Monocyte Absolute Count 1.03 (H) 0.00 - 1.00 x 1000/?L  Eosinophil Absolute Count 0.17 0.00 - 1.00 x 1000/?L  Basophil Absolute Count 0.02 0.00 - 1.00 x 1000/?L  Absolute Immature Granulocyte Count 0.43 (H) 0.00 - 0.30 x 1000/?L  Absolute nRBC 0.00 0.00 - 1.00 x 1000/?L POC Glucose (Fingerstick)  Collection Time: 10/30/20  3:58 AM Result Value Ref Range  Glucose, Meter 146 (H) 70 - 100 mg/dL Basic metabolic panel  Collection Time: 10/30/20  3:58 AM Result Value Ref Range  Sodium 142 136 - 144 mmol/L  Potassium 5.0 3.3 - 5.3 mmol/L  Chloride 106 98 - 107 mmol/L  CO2    Anion Gap    Glucose 141 (H) 70 - 100 mg/dL  BUN 72 (H) 6 - 20 mg/dL  Creatinine 1.61 (H) 0.96 - 1.30 mg/dL  Calcium 8.6 (L) 8.8 - 10.2 mg/dL  BUN/Creatinine Ratio 04.5 (H) 8.0 - 23.0  eGFR (Afr Amer) 39 >60 mL/min/1.46m2  eGFR (NON African-American) 32 >60 mL/min/1.73m2 Procalcitonin     (BH GH LMW Q YH)  Collection Time: 10/30/20  3:58 AM Result Value Ref Range  Procalcitonin 0.50 (H) See Comment ng/mL POC Glucose (Fingerstick)  Collection Time: 10/30/20  4:54 AM Result Value Ref Range  Glucose, Meter 121 (H) 70 - 100 mg/dL POC Glucose (Fingerstick)  Collection Time: 10/30/20  7:11 AM Result Value Ref Range  Glucose, Meter 108 (H) 70 - 100 mg/dL POC Glucose (Fingerstick)  Collection Time: 10/30/20  8:05 AM Result Value Ref Range  Glucose, Meter 141 (H) 70 - 100 mg/dL Vancomycin, random     (BH GH LMW YH)  Collection Time: 10/30/20  9:02 AM Result Value Ref Range  Vancomycin Random 18.6 (H) See Comment ug/mL POC Glucose (Fingerstick)  Collection Time: 10/30/20  9:02 AM Result Value Ref Range  Glucose, Meter 147 (H) 70 - 100 mg/dL POC Glucose (Fingerstick)  Collection Time: 10/30/20 10:12 AM Result Value Ref Range  Glucose, Meter 122 (H) 70 - 100 mg/dL Assessment Knute Mazzuca is a 55 yo man w/ PMHx SCC of the larynx, HTN, HLD, DM2 who presented to 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 Patient continues to have persistent altered mental status in the context of recurrent pneumonia. Remains on propofol for sedation minimizing utility of psychiatric assessment today. Had been on clonazepam for presumed panic disorder but in the context of persistent altered mental status this could be discontinued. Goals of care discussion will be productive in ensuring patient/family understanding of prognosis and establishing goals moving forward.Psychiatric DiagnosisPanic disorderPsychological factors affecting medical careActive Hospital Problems  Diagnosis ? Principal Problem/Diagnosis:  Laryngeal cancer (HC Code) (HC CODE) [C32.9] ? Acute hypoxemic respiratory failure (HC Code) (HC CODE) [J96.01] ? Difficult airway [T88.4XXA] ? 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.3] No data recordedSAFETY AND RISK ASSESSMENT  Unable to assess in context of sedationPLAN - Reasonable to discontinue clonazepam in context of persistent altered mental status- Psychiatry will sign off at this time but remain available for further recommendations should patient's mental status improveElectronically SignedProvider:  Geoffery Spruce, MDPGY-4 Neurology Resident2/25/202210:47 AM

## 2020-10-30 NOTE — Plan of Care
Plan of Care Overview/ Patient Status    0700-1900Patient intermittently drowsy and restless at times. Crying at times with tense look on his face. Propofol continued, scheduled ativan and PRN oxy per CPOT scale. Wrist restraints for airway/line safety. SR in the 70's-90's. MAP goal >65, levo continued and titrated accordingly. tmax 100.4 antibiotics continued. Continues on ventilator, changed to CPAP 125/5 and tolerated for about 6 hours,  Had increased WOB and tachypnea, placed back on resting mode.. bivona trach in place, it dislodged a few centimeters when turning him, attending placed back in, pt did not loose volumes or desaturated at a any point. Suctioned Q1-2 hrs for moderate/large amounts of thick tan/yellow creamy secretions. Coarse lung sounds throughout. Abdomen soft, tolerating TF via PEG with minimal residuals. Foley patent, adequate amounts of clear yellow urine. Insulin gtt per protocol.Skin care as ordered, excoriation throughout genital and buttocks/thighs area.Turned and repositioned as tolerated.1600- pt woke up in a panic state while attempting to check glucose level. He started sitting up in bed, pulling at restraints, kicking the side rails, moving legs off the bed. Mouthing help, help, attempted to re-assure and re-direct with no avail. Pt became tachycardic to the 140's, tachypneic to the 40's-50's. PRN ativan and dilaudid given per orders but minimal effect. Versed was consequently ordered with +effect.

## 2020-10-30 NOTE — Progress Notes
Otolaryngology Progress NoteDate: 10/30/2020 Interim History- NAEOVitalsTemp:  [36.4 ?C-37.9 ?C] 36.4 ?CPulse:  [70-120] 70Resp:  [11-62] 12BP: (69-143)/(38-109) 107/55SpO2:  [64 %-100 %] 99 %Device (Oxygen Therapy): mechanical ventilator I/O last 3 completed shifts:In: 5026.9 [I.V.:1980.4; NG/GT:2315; IV Piggyback:731.5]Out: 2400 [Urine:2400]Physical ExamGen: alert, vented PCEyes: open, normal external appearanceFace: grossly symmetricNose: clear anteriorlyOP/OC: hemostaticNeck: Bivona 8 cuffed secured w/soft trach ties, necrotic but hemostatic mass surrounding the stoma, remaining neck flat, firm tumor Pulm: no increased WOB on TMDiagnostic DataReviewed, per chartAssessment & Plan54 M with PMH of HTN, HLD, DM, tobacco and EtOH use, Waldenstrom macroglobulinemia, 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 Admitted to SICU for ventilatory support. No acute ENT intervention planned; no curative or meaningful surgical options at this time. Course complicated by multiple episodes of Bivona trach occlusion/obstruction.  - no ENT intervention planned at this time; case discussed at tumor board 2/14 and is not a surgical candidate - disease management/goals-of-care best pursued via non-surgical means; eg, Medical Oncology, Radiation Oncology, Palliative Care- transfer to Medical Oncology floor when eligible - continue routine trach care with careful suctioning- ensure trach well secured with Karn Cassis, MDPGY-1, Anesthesiology

## 2020-10-30 NOTE — ACP (Advance Care Planning)
Multi-disciplinary (ENT / SICU / palliative) meeting with patient's sister: Marene Lenz, and patient's niece: [name] Nazario.  We discussed recent, major clinical events, including diagnosis of non-curable malignancy, completion of palliative XRT, and necessity of ventilatory support / sedation to facilitate the aforementioned treatment.  Unfortunately, despite these measures aimed at palliation, his critical care issues included recurrent pneumonia, lack of progression with ventilator wean, refractory agitation, and septic shock. We re-iterated his clinical situation will likely require a prolonged ICU / hospital course before he would even be a candidate for chemotherapy, as our medical oncology services plan on performing this as an outpatient. Family demonstrated excellent understanding of his clinical progression and all questions pertaining to such were answered satisfactorily.Both [name] and [name] understood that Kimon's prognosis was clearly guarded. [name] expressed that, if it were up to her, she would not want him to continue to suffer, but the patient himself has expressed that he would want to continue with all therapies on numerous occasions. To make matters even more difficult, Easten has been unable to advocate for himself in the last few days due to his critical illness and multi-factorial encephalopathy. They both felt extremely conflicted balancing Eero's previously stated wishes with how uncomfortable he appears, but felt that they needed ultimately respect his prior decisions.At the end of the meeting, they did independently inquire what a transition to comfort- focused approach to care would look like, and our colleagues from palliative were able to broach the concept of hospice / comfort care. Finally, the family stated that, despite the patient's previous stated code status, they do not think that would be most in line with what Delsin would actually want given his current circumstances. They felt that transitioning him to DNR would be the most appropriate at this time.Summary:- Continue GOC discussions- Continue current plan of care- Transition code status to DO NOT RESUSCITATE- We commend [name] and [name] for their dedication in advocating for their loved Randie Heinz, PA-CSICU APP

## 2020-10-30 NOTE — Progress Notes
[facility]	SICU Progress NoteAttending Provider: Alric Quan, MDPost-Operative Day/Post Injury Day Procedure(s):2/5: [name] (ENT) Hospital LOS: 21 days Interim History:  HPI: 55 y.o. male admitted to the SICU on 2/9 for ventilator management following an RRT on the oncology floor for desaturation and increased work of breathing while on trach mask and ultimately placed back on vent. ?Patient was initially a transfer from [facility] on 2/4 after trach and PEG when he had a partial obstruction of trach site from a new necrotic mass. He underwent trach exchange 2/5. Was on trach mask on oncology floor however had 2 episodes on floor requiring RRT for lavage and likely mucous plugging. ?Course Complicated by: - Incidental R IJ thrombus - Multiple episodes of presumed mucous plugging requiring RRT's ?PMH: Chronic ITP, Waldenstrom macroglobulinemia, HTN. HLD, DM, tobacco and alcohol use, Laryngeal SCC (weekly Cisplatin and RT)?PSH: None known ?Home meds: Lipitor, Pepcid, Lisinopril, Metformin, Oxycodone, miralax, Insulin U100 and Lantus SICU course:2/9: Arrives to SICU HDS, on vent PCV. Appears well. Transitioned to Pressure support on vent and tolerating well. O/N: Episode of patient feeling like not getting a full breath, extremely tachypenic and increased work of breathing and tachycardia, attempted suctioning and lavage without improvement, attempted to switch to PCV and VAC but due to dyssynchrony unable to tolerate, no desat but poor ventilation and TV and ETCO2 up to 100. ENT at bedside do not feel additional scope would benefit as he was just scoped a few hours ago, ultimately gave fentanyl push and started propofol for vent synchrony but despite max prop still dyssynchronous. Gave one dose of roccuronium once sedated for vent synchrony with good effect. Albuterol and HTS neb given and peaks improved from 50 to 30's. No further paralytic needed and now synchronous on VAC. Kept on propofol for vent synchrony. CXR reassuring and back to GCS 11T. Hypotension only following sedation and Rocc, Low dose Neo started. 2/10: Kept on higher sedation in AM for [place] A/P (for staging) and soft tissue neck. Weaning sedation and will plan for PSV trial once more awake. Scoped by ENT in AM, airway patent. Q1-2hr suction requirements of thick tan secretions. No further episodes of respiratory distress although sedation remains high. Tube feeds changed to continuous given significant hyperglycemia. Insulin regimen changed per diabetes team recommendations. Pressure ulceration noted around tracheostomy --> ENT at bedside to evaluation, likely some component of invasive tumor growth. Palliative care consult for assist with symptom management and family moving forward. Primary oncologist office made aware of referral. O/N: weaning down on Prop, no episodes of respiratory distress or desats overnight. Febrile to 101.3, sputum and blood cultures ordered. 500cc LR bolus given for some oliguria and rising Cr. 2/11: Given 500cc NS for oliguria with good response. Vanco level yesterday 25, likely cause of worsening aki, on hold. UOP & lytes remains stable. Insulin adjusted per diabetes team. PSV as tolerated (doing well). CPT added. Confirmed with ENT pulm recs for blue portex suctionaid trach- no plans to switch at this time. Bowel regimen added. Plan keep on neo if low dose but if inc, consider changing to levo as likely then not in setting of sedation. Family updated on phone. Confirmed w heme- no AC rec for R IJ thrombus until plt > 50. Was going to attempt to place pt on TM overnight as he did very well on PSV x 3hrs, however, pt had anxiety attack regarding his cancer and prognosis, he requested to sleep during night and was agreeable to TM in  am. O/N: Patient agrees to PSV overnight which he did very well on. Agreeable to getting OOB to chair and possibly trach mask in AM but wants to stay on vent tonight. Off Neo by AM.  2/12: trial trach mask as tolerated this AM. Adjusted insulin regimen per the diabetes management service. Sputum now with staph, awaiting sensitivities. More loose stools today; nursing placed rectal tube and held bowel regimen.  ON: Anxious at times, feeling like he cannot breath but doing well on TM. Frequent suctioning. Increased standing. FIO2 increased to 40%.  insulin to 14U per endo reccs. 2/13: continue to adjust insulin regimen, now 20u regular q6. Cr continues to uptrend 2.57 --> 2.63 and repeat vanco level still elevated 24.6; pharmacy to continue dosing adjustments. Otherwise, remains on trach mask. Sputum with MRSA --> d/c ceftriaxone. ON: increased standing insulin to 24U. 2/14: Will discuss [facility], The plan with hematology now that PLT > 50 x2 checks. Will also d/c vancomycin today (10d of therapy thus far). AKI appears to have peaked at 2.57 -> 2.63 -> 2.47. Awaiting transfer to the oncology service on NP 15. Okay to start [facility], but will hold off today given more bloody secretions. 2/15: Plan to start palliative RT today. AM session aborted due to extreme anxiety (despite premedication with IV ativan), tried again in the PM with 10mg  IV Valium + 3mg  IV ativan, procedure again aborted due to extreme anxiety and inability to tolerate. Pending discussion with rad onc in AM re: utility of ongoing attempts at RT. Pending transfer to NP15 on med onc service.  O/N: increasingly anxious / restless, but able to re-direct and calm down with PRN ativan. Liquid stools, placed rectal tube. Will start banatrol and check CBC in AM, check c dif. This had previously improved by holding bowel regimen.2/16: Acutely increased WOB, HR 130s, SBP 170s --> ENT bedside scope with trach patent, though notable secretions --> suctioned with minimal improvement; placed back on ventilator PSV 10 / +5 with immediate improvement in VS and respiratory distress. Precedex gtt started to assist more frequent suctioning. CXR with LLL consolidation --> resend sputum, and restart Vanco (close trough monitoring with AKI) given known MRSA PNA. Increased diarrhea, cdiff negative, transition to Vital TF. LR 500cc for hemoconcentration of labs, tachycardia. Appreciate radiation oncology; sedated patient with Propofol gtt, bolus x1 with good effect --> able to tolerate 15 minute radiation session; plan to repeat Thursday, Friday, Monday, and Tuesday. 2/17: Psych consulted for depression--> Doxepin added, Klonopin added. Hypernatremia--> D5W and free water flushes added. Ceftaz added for GNR in sputum. Plan for family discussion this PM (see separate note) and radiation this PM. Foley replaced for retention. ON: Tolerated PSV 15/5. Repeat Na 148.2/18: Plan for radiation this afternoon. Rectal tube removed.. Hgb 6.3--> transfused 1un PRBCs. Discussed care with sister and niece at bedside, remain full code and plan to continue radiation course through Tuesday (see family communication note).2/19: PSV weaned 15 --> 12/+5. Start Clonidine and discontinue Precedex gtt. Discontinue Gabapentin and Dilaudid IV, decrease Oxycodone to 2.5 / 5. Na improved 144, discontinue D5W; sugars should improve, maintain current regimen per DM team. GNR per micro lab not pseudomonas, klebsiella, or other enteric organism; would have speciated --> change Ceftaz to PO Cipro, to complete course 2/23 for one week. Cr again increased with Vanco level over 20 --> next dose held, monitor level closely. Hgb remains low despite 1U yesterday, 6.3 --> 7.0; start multivitamins and check with Heme. ON: Low grade fevers stable. Given ativan  0.5mg  in place of klonipin with good anxiolytic effect. Transfuse 1u PRBC (likely half unit) for Hgb 6.7.2/20: Acute respiratory distress this morning after turn, minimal secretions, did not improve with increased pressure or VAC --> bagged x5 mins with improvement, placed on PCV, IP 20 --> CXR pending, start Propofol, add back Ativan and Morphine PRN. Appreciate heme assistance with persistent anemia. Cr continues to rise following elevated vanco trough; hold any further doses, level will likely remain therapeutic to final date of course (2/22) --> unfortunately, patient did receive additional dose of Vanco 2g per pharmacy order this morning; discontinued placeholder, consider Doxycycline if Vanco level subtherapeutic prior to end date.ON: Remains on PCV. Remained on low dose levo 0.02 throughout night. Manual suctioning by RT overnight with thin tan secretions. AM labs notable for Hgb 9.1(6.7) after 1uPRBC transfusion yesterday. Worsening AKI Cr 2.51 (2.44).2/21: Patient alert and breathing comfortably this morning --> wean PEEP back to 5, consider retrial of PSV today pending radiation plans. Free water flushes discontinued, hypernatremia resolved. Worsening AKI from Vanco; since discontinued, monitor UOP. LR 250cc, wean off Levo gtt. Appreciate palliative care assistance after event yesterday, patient requesting further discussion. Lantus changed to 15U QHS, 10U QAM and bolus increased to 28U Q6.ON: Acute respiratory distress at change of shift, lost tidal volumes and desaturated to high 80s requiring bagging, recovered. Pt expressing that he cannot breathe, feels obstructed --> suctioned with 10Fr suction w/ resistance met at 20cm and minimal secretions. ENT at bedside for scope --> thick secretions noted but airway patent.. Noted that Bivona trach was retracted, advanced to proper position by ENT. Per ENT, tracheal tissue proximal to bivona tip very flimsy. Likely tissue collapsing against distal tip with trach retracted in poor position causing obstruction. Patient acutely distressed and agitated during event, propofol gtt restarted will plan to wean off tonight. Start decadron 8mg  q12 per onc recs for peritumoral inflammation. Hyperkalemic in AM to 5.7 --> EKG unchanged, insulin/dextrose/Ca2+ x 1. Transfuse 1u PRBC for hgb 6.9. 2/22: Keep on PCV today and continue propofol gtt. Start Insulin gtt and increase Lantus to 20u BID. No bowel movements --> discontinue banatrol. Plan for radiation today. Discussed transfer to MICU no beds available, will initiate transfer once a bed becomes available. Plan for multidisciplinary family meeting this week. ON: Rested on PCV, low dose prop. WBC count 12 (7) this am2/23: Plan for last palliative radiation session later today. RR decreased to 12. Given rising white count and increased in line suctioning requirement Sputum culture sent and CXR obtained. Will stop Cipro and start ceftriaxone. 1L LR bolus given for AKI, rising BUN as well as likely concentration on CBC. Plan to start weaning off prop after radiation and attempt PSV trials again. Family discussion with all teams Thursday or Friday. CPT added back given increased secretions attempted to add back HTS nebs however hospital is completely out per pharmacy discussion. Respiratory culture again growing GPC and GNR. ON: BG stable on ins gtt. NAE2/24: This AM, less responsive (GCS 10T) and BP down to 60/40s, but also worsening vent synchrony --> increased oxycodone and dilaudid followed by 1000 LR bolus with good effect and pan-cx given worsening leukocytosis. Started vanc / zosyn.  Later, self d/c bivona trach and desat to 60s; SICU attending at bedside and replaced bivona trach without difficulty and placed back on the ventilator.  ENT at bedside to scope and confirmed trach positioning. Will plan for family meeting tomorrow. ON: NAE2/25: GCS 11T, but still withdrawn / lethargic. Will  d/c PRN ativan to minimize sedation and continue to wean propofol as tolerated, though patient has been mentating on higher doses than this. Still low dose levo requiremements. Bivona trach dislodged ~4cm today, obturator placed and re-advanced without issue. Trial PSV 15/5, tolerating with Tv 400s. D/w ENT, will try to work on custom trach for him. Plan for family meeting this afternoon for goals of care discussions. Rested on [facility] settings after ~6 hours of 15/5 (work of breathing + tachypnea 40s). Decreased lantus to 15 BID per endocrine. In afternoon, became more agitated / tachy requiring additional PRN 1mg  ativan + 1mg  versed + dilaudid. See ACP note for family meeting discussion.Review of Allergies/Meds/Hx: I have reviewed the patient's: allergies, past medical history, past surgical history, family history, social history, prior to admission medicationsObjective: Vitals:I have reviewed the patient's current vital signs as documented in the EMRTemp:  [97.34 ?F (36.3 ?C)-100.22 ?F (37.9 ?C)] 97.88 ?F (36.6 ?C)Pulse:  [70-120] 86Resp:  [12-62] 24BP: (69-143)/(38-109) 110/58SpO2:  [64 %-100 %] 98 %SpO2: 98 %Device (Oxygen Therapy): mechanical ventilatorI/O's:I have reviewed the patient's current I&O's as documented in the EMR.Gross Totals (Last 24 hours) at 10/30/2020 0449Last data filed at 10/30/2020 0400Intake 5078.89 ml Output 2375 ml Net 2703.89 ml Physical ExamVitals reviewed. Constitutional:     General: Anxious gentleman appearing older than stated age. HENT:    Head: Normocephalic and atraumatic.    Mouth: Mucous membranes are moist.    Extraocular Movements: Extraocular movements intact.    Pupils: Pupils are equal, round, and reactive to light.    Comments: Tracheostomy in placeCardiovascular:    Rate and Rhythm: Regular rhythm. Regular rate   Heart sounds: No murmur heard. Pulmonary:    Breath sounds: No wheezing, rhonchi or rales.    Comments: Ventilated via trachAbdominal:    General: There is no distension.    Tenderness: There is no abdominal tenderness.    Comments: PEG tube in place with TFs runningGenitourinary:   Comments: Foley in place draining clear, yellow urineMusculoskeletal:    Lower legs: Mild edema   Comments: Moves all extremities equally, to command.Skin:   General: Skin is warm and dry.    Capillary Refill: Capillary refill takes less than 2 seconds. Neurological:    General: No focal deficit present.    Mental Status: He is alert.    Cranial Nerves: No cranial nerve deficit.    Motor: No weakness. Labs: Refer to EMR. I have reviewed the patient's labs within the last 24 hours; see assessment and plan below for significant abnormals.Microbiology:Refer to EMR. I have reviewed all new results within the last 24 hours; see assessment and plan below.Diagnostics:Refer to EMR. I have reviewed all new results within the last 24 hours; see assessment and plan below.Chest OZ:HYQMV to EMR. I have reviewed all new results within the last 24 hours; see assessment and plan below.Abdominal HQ:IONGE to EMR. I have reviewed all new results within the last 24 hours; see assessment and plan below.ECG/Tele Events: I have reviewed the patient's ECG and telemetry as resulted in the EMR.Assessment/Plan Neurologic: Analgesia/sedation - Tylenol 650 Q6 PRN - Oxycodone 10 / 15 Q4 PRN - Dilaudid 1 PRN breakthrough- Propofol gtt, wean as able  Anxiety- s/p clonidine taper (stopped early 2/2 hypotension)- Clonazepam 0.5mg  BID, consider d/c if mental status continues to be depressed- Doxepin 10mg  QHS- Ativan 0.5mg  IV Q6 PRN acute agitation- Appreciate palliative care and psych consults?ENT: L laryngeal SCC, invading tracheostomy: s/p trach exchange 10/10/20 and biopsy 10/12/20- Bivona trach to bypass  obstruction 	- F/u with ENT re: custom Bivona trach- Unfortunately, there are no surgical / curative options- Palliative radiation (2/16, 2/17, 2/18, 2/21, 2/22, 2/23)	- Required PCV with full propofol sedation to tolerate?Cardiac: Septic shock- MAP > 65	- Norepinephrine gtt if needed (2/24- present)Hx of HTN / HLD- Hold home lisinopril, lasix, crestor given AKI?Respiratory:  Respiratory failure (circumferential tumor invasion of stoma, mucous plugging / trach occlusion)- PC/AC: IP 20, PEEP +5- s/p Decadron 8mg  BID for tumor inflammation (2/22 -2/24) per med oncology- Albuterol Q6- CPT 4 times daily - Could consider mucomyst nebs- Attempted to add back HTS nebs however hospital is completely out per pharmacy discussionGI: Diarrhea, resolved - PEG prior to admission- TF: Vital @ 95cc/hr; while on propofol --> peptamen at 85cc/hr?Renal: AKI on CKD (baseline Cr 1.5-1.6), 2/2 supratherapeutic vancomycin level, improving - Cr peaked 2.6, continue to trend daily- Foley replaced 2/17 for urinary retention?Infectious Disease:  Concern for new pneumonia- 2/24 sputum: GNR- 2/23 sputum: MRSA- Vanco  (2/24- present)- Zosyn (2/24- present)Hx MRSA PNA; 4+ GNR PNA- s/p Vanco (2/5 - 2/14, 2/16 - 2/22)- s/p Ceftaz (2/17- 2/19)- s/p Cipro (2/19 - 2/23)?Hematology:Critical illness anemia- Sanguinous secretions, expected given tumor burden and coagulopathy, may worsen with radiation and baseline hematologic disease- Multivitamins and cofactors- Maintain Hgb >7?Hx of chronic ITP, Waldenstrom macroglobulinemia- Transfuse platelets <50 if bleeding, <10 if no active bleeding- Appreciate hematology recommendations?R IJ thrombus - High risk heparin gtt when plt consistently >50 and no more bloody secretions from tracheostomy- HSQ- Maintain SCDs at all times?Endocrine: Hx T2DM- Lantus 15 BID- Insulin gtt - HOLD home Metformin, Lantus 64u, Apidra 20u TID w meals- Appreciate diabetes team recsMusculoskeletal: Generalized deconditioning- OOB daily, PT/OT evals- Turn and reposition Q2 hours, PUP dressing?Device Review:- R IJ Port (08/12/20) per guidelines, nonocclusive DVT does not require removal of line- PEG (09/14/20)- Bivona trach (10/10/20)?Code Status / Dispo:- Transition to DNR (see ACP note 2/25)- Per ENT, patient to transfer to medical oncology after SICU- Appreciate palliative care assistance with GOC discussionsNotifications : For questions please call SICU Covering Provider in Epic EMR (primary) SICU M[facility] Dynamic Roles (secondary)Y[facility] 7-1 APP			[phone] 6-1 Frontside		[phone] 6-1 Backside		[phone] Verdi-2 APP			[phone]:Eron Staat, PA-CSICU APPMHB [phone]/25/2022

## 2020-10-31 LAB — MAGNESIUM: BKR MAGNESIUM: 2.1 mg/dL (ref 1.7–2.4)

## 2020-10-31 LAB — BASIC METABOLIC PANEL
BKR ANION GAP: 14 (ref 7–17)
BKR BLOOD UREA NITROGEN: 75 mg/dL — ABNORMAL HIGH (ref 6–20)
BKR BUN / CREAT RATIO: 31.4 — ABNORMAL HIGH (ref 8.0–23.0)
BKR CALCIUM: 9.2 mg/dL (ref 8.8–10.2)
BKR CHLORIDE: 105 mmol/L (ref 98–107)
BKR CO2: 22 mmol/L (ref 20–30)
BKR CREATININE: 2.39 mg/dL — ABNORMAL HIGH (ref 0.40–1.30)
BKR EGFR (AFR AMER): 34 mL/min/{1.73_m2} (ref >60)
BKR EGFR (NON AFRICAN AMERICAN): 28 mL/min/{1.73_m2} (ref >60)
BKR GLUCOSE: 167 mg/dL — ABNORMAL HIGH (ref 70–100)
BKR POTASSIUM: 5.2 mmol/L (ref 3.3–5.3)
BKR SODIUM: 141 mmol/L (ref 136–144)

## 2020-10-31 LAB — MANUAL DIFFERENTIAL
BKR WAM BANDS (DIFF) (1 DEC): 0.9 % (ref 0.0–10.0)
BKR WAM BASOPHIL - ABS (DIFF) 2 DEC: 0 x 1000/ÂµL (ref 0.00–1.00)
BKR WAM BASOPHILS (DIFF): 0 % (ref 0.0–1.4)
BKR WAM EOSINOPHILS (DIFF) 2 DEC: 0.7 x 1000/ÂµL (ref 0.00–1.00)
BKR WAM EOSINOPHILS (DIFF): 5.3 % — ABNORMAL HIGH (ref 0.0–5.0)
BKR WAM LYMPHOCYTE - ABS (DIFF) 2 DEC: 0.12 x 1000/ÂµL — ABNORMAL LOW (ref 0.60–3.70)
BKR WAM LYMPHOCYTES (DIFF): 0.9 % — ABNORMAL LOW (ref 17.0–50.0)
BKR WAM METAMYELOCYTES (DIFF) 1 DEC: 0.9 % (ref 0.0–1.0)
BKR WAM MONOCYTE - ABS (DIFF) 2 DEC: 0.58 x 1000/ÂµL (ref 0.00–1.00)
BKR WAM MONOCYTES (DIFF): 4.4 % (ref 4.0–12.0)
BKR WAM NEUTROPHILS (DIFF): 87.6 % — ABNORMAL HIGH (ref 39.0–72.0)
BKR WAM NEUTROPHILS - ABS (DIFF) 2 DEC: 11.68 x 1000/ÂµL — ABNORMAL HIGH (ref 2.00–7.60)
BKR WAM NORMAL RBC MORPHOLOGY: NORMAL

## 2020-10-31 LAB — CBC WITH AUTO DIFFERENTIAL
BKR WAM ABSOLUTE NRBC (2 DEC): 0 x 1000/ÂµL (ref 0.00–1.00)
BKR WAM HEMATOCRIT (2 DEC): 28.2 % — ABNORMAL LOW (ref 38.50–50.00)
BKR WAM HEMOGLOBIN: 8.6 g/dL — ABNORMAL LOW (ref 13.2–17.1)
BKR WAM MCH (PG): 27.4 pg (ref 27.0–33.0)
BKR WAM MCHC: 30.5 g/dL — ABNORMAL LOW (ref 31.0–36.0)
BKR WAM MCV: 89.8 fL (ref 80.0–100.0)
BKR WAM MPV: 11.5 fL (ref 8.0–12.0)
BKR WAM NUCLEATED RED BLOOD CELLS: 0 % (ref 0.0–1.0)
BKR WAM PLATELETS: 142 x1000/ÂµL — ABNORMAL LOW (ref 150–420)
BKR WAM RDW-CV: 18 % — ABNORMAL HIGH (ref 11.0–15.0)
BKR WAM RED BLOOD CELL COUNT.: 3.14 M/ÂµL — ABNORMAL LOW (ref 4.00–6.00)
BKR WAM WHITE BLOOD CELL COUNT: 13.2 x1000/ÂµL — ABNORMAL HIGH (ref 4.0–11.0)

## 2020-10-31 LAB — PHOSPHORUS     (BH GH L LMW YH): BKR PHOSPHORUS: 5 mg/dL — ABNORMAL HIGH (ref 2.2–4.5)

## 2020-10-31 LAB — LOWER RESP CULTURE, QUAL: BKR LOWER RESPIRATORY CULTURE (QUAL): NORMAL

## 2020-10-31 LAB — VANCOMYCIN, RANDOM     (BH GH LMW YH)
BKR VANCOMYCIN RANDOM: 16.3 ug/mL — ABNORMAL HIGH
BKR VANCOMYCIN RANDOM: 14.1 ug/mL

## 2020-10-31 MED ORDER — VANCOMYCIN MAR LEVEL
Freq: Once | INTRAVENOUS | Status: CP
Start: 2020-10-31 — End: ?

## 2020-10-31 MED ORDER — VANCOMYCIN MAR LEVEL
Freq: Once | INTRAVENOUS | Status: DC
Start: 2020-10-31 — End: 2020-10-31

## 2020-10-31 MED ORDER — INSULIN GLARGINE 100 UNIT/ML SUBCUTANEOUS SOLUTION
100 unit/mL | Freq: Two times a day (BID) | SUBCUTANEOUS | Status: DC
Start: 2020-10-31 — End: 2020-11-01
  Administered 2020-11-01 (×2): 100 mL via SUBCUTANEOUS

## 2020-10-31 MED ORDER — SODIUM CHLORIDE 0.9 % BOLUS (NEW BAG)
0.9 % | Freq: Once | INTRAVENOUS | Status: CP
Start: 2020-10-31 — End: ?
  Administered 2020-10-31: 15:00:00 0.9 mL/h via INTRAVENOUS

## 2020-10-31 MED ORDER — VANCOMYCIN IVPB (2 G IN 500ML NS)
Freq: Once | INTRAVENOUS | Status: CP
Start: 2020-10-31 — End: ?
  Administered 2020-10-31: 17:00:00 500.000 mL/h via INTRAVENOUS

## 2020-10-31 NOTE — Progress Notes
Due to the complex nature of this patient's critical illness I have seen and evaluated this patient and formulated the following plan in addition to the advanced practice provider's care. I have seen and evaluated [facility] and agree with the findings and plan as documented in the APP's note with the following changes. I have also reviewed all the pertinent labs and imaging.CV: hemodynamics: apparent septic shock, compounded by sedation needsPressors: levophedArrhythmia: no				Antihypertensives: heldShock: septicPulmonary:Transitioned back to PC overnight after tolerating PS x 6 hours yesterday, then became tachypneic, will try 4 hours of PS todayCopious secretions, suctioning q 0.5 hour, pt able to coughRespiratory toiletGI/Nutrition:Tube feeds:at goal via PEGLast BM yesterday, start stool softener if no BM todayProtein-calorie malnutrition: high metabolic demand due to tumorRenal:AKI: yesOngoing AKI, supratherapeutic vancomycin levelsHigh insensible losses, will give 1LElectrolyte abnormalities: monitor hyper K and hyperphosEndocrine:Glycemic control 120-180 with insulin drip and lantus, increase back to 20 bidSteroids off x 48 hoursHeme:Anemia of chronic diseaseDVT ppx: hep sqID:Abx: vanco for MRSA pneumonia, zosyn to be d/c'dWill switch to clindamycinSepsis: yesNeuro:Sedation: pt very anxious when off propofol, continue propofol and klonopin, ok to have ativan if needed. Compounded by some sepsis-related confusion, overall neuro status more interactive than yesterdayPain control: fentanylDelirium: possiblyMSK/skin:Denuded skin and tumor around trach siteDispo: SICU, awaiting MICUDVT ppx: heparin		GI ppx: PPIFamily present at bedside: no, extensive family meeting via telephone yesterday, oncology unfortunately not present despite family's multiple questions about potential chemo and effects of radiation. They elected to make Mr. Biel DNR. They plan to visit todayLucy Bronte Kropf, MDTrauma, Surgical Critical Care, and Acute Care Surgery2/26/2022This patient is critically ill as indicated by the following Acute Respiratory Failure, Septic Shock and counseling of familyThe patient has required 30 minutes of my undivided attention during the past 24 hours for one of the previous mentioned life threatening diagnoses. The critical care interventions have included: Management of Acute Respiratory Failure with adjustments to ventilator settings/mode to maintain adequate ventilation / oxygenation , Management of septic shock with antibiotics, Vasopressors levophed to maintain adequate end organ perfusion  and Bolus volume resuscitation , Family discussions related to Goals of care and Ongoing medical decision making and Support of intensive care management with consulting services including Respiratory, Palliative Care and medical oncology

## 2020-10-31 NOTE — Plan of Care
Plan of Care Overview/ Patient Status    0700-1900Patient alert, opening eyes spontaneously. Appears anxious, afraid, scared. Emotional support provided. Not following commands but does grab provider's hand when turning moving. MAE. PERRLA. Continues on propofol, drowsy to restless at times. Attempted to wean sedation down but pt woke up anxious, sitting up in bed, pulling on restraints, tachy to the 110's, RR in the 40's-50's. Unable to be re-directed. Reassurance and re-orientation  provided with no effect, sedation increased to previous settings.Wrist restraints for airway/line safety. SR in the 80's-90's. SBP 90's-120's/40's-50's with MAP >65, levo infusing to maintain goal. Low grade fever continues, tylenol as ordered. Bivona trach in place with trach ties. Mass protruding around trach site. Suctioning Q30 min-1 hr for moderate to large amounts of yellow creamy secretions and lots of white oral secretions. Coarse lung sounds. CPAP 15/5 for about 5 1/2 hrs. Abdomen soft, tolerating TF, minimal to no residuals. Foley with adequate amounts. 1 L bolus given as ordered. Insulin drip per protocol. Turned and repositioned as tolerated.

## 2020-10-31 NOTE — Other
[facility]    [place] [facility]     Palliative Care Follow-Up Note    Present History   Family meeting held this afternoon via conference call with patient's sister [name] and niece [name], with SICU team, ENT, and Palliative Care. Discussion occurred away from bedside as Mr. Bort remains sedated and intermittently agitated, and it was felt that he could not meaningfully participate in the conversation. Mr. Gentil family understand him to be gravely ill and that there is low likelihood of him recovering enough to qualify for further chemotherapy, which they further understand would only be palliative in nature. They express ambivalence about continuing with full, aggressive interventions, are worried that he is suffering, and would like to see him made more comfortable; but they also acknowledge that any time they have been able to elicit Lenny's preferences for his care, this has seemed to be the treatment plan that he would choose for himself, and they want to respect his wishes. At their request, we explained the options for transition to comfort measures and virtual hospice at [facility] as well as hospice care provided at a dedicated hospice facility. At the end of the discussion, [name] and [name] did request that Sela Hua be made DNR at this time but that we otherwise continue with the current plan of care. They understand that if his heart were to stop, an attempt at resuscitation would be unlikely to restore him to his current state of health and would very likely leave him even further debilitated. They would like to spare him further suffering should this occur.     Review of Allergies/Hx/Meds   Allergies  Patient has No Known Allergies.    Medical History  Past Medical History:   Diagnosis Date   ? Diabetes mellitus (HC Code) (HC CODE)    ? Hypercholesteremia    ? Hypertension        Family History  Family History   Problem Relation Age of Onset   ? Aneurysm Mother        Social History  Social History     Socioeconomic History   ? Marital status: Legally Separated     Spouse name: Not on file   ? Number of children: Not on file   ? Years of education: Not on file   ? Highest education level: Not on file   Occupational History   ? Not on file   Tobacco Use   ? Smoking status: Former Smoker     Packs/day: 1.00     Years: 30.00     Pack years: 30.00     Quit date: 03/04/2013     Years since quitting: 7.6   Substance and Sexual Activity   ? Alcohol use: Yes     Alcohol/week: 14.0 standard drinks     Types: 14 Cans of beer per week   ? Drug use: Not on file   ? Sexual activity: Not on file   Other Topics Concern   ? Not on file   Social History Narrative   ? Not on file     Social Determinants of Health     Financial Resource Strain:    ? Difficulty of Paying Living Expenses:    Food Insecurity:    ? Worried About Programme researcher, broadcasting/film/video in the Last Year:    ? Barista in the Last Year:    Transportation Needs:    ? Freight forwarder (Medical):    ?  Lack of Transportation (Non-Medical):    Physical Activity:    ? Days of Exercise per Week:    ? Minutes of Exercise per Session:    Stress:    ? Feeling of Stress :    Social Connections:    ? Frequency of Communication with Friends and Family:    ? Frequency of Social Gatherings with Friends and Family:    ? Attends Religious Services:    ? Active Member of Clubs or Organizations:    ? Attends Banker Meetings:    ? Marital Status:    Intimate Partner Violence:    ? Fear of Current or Ex-Partner:    ? Emotionally Abused:    ? Physically Abused:    ? Sexually Abused:        InPatient Medications  Current Facility-Administered Medications   Medication Dose Route Frequency Last Rate   ? midazolam (PF)         ? acetaminophen  650 mg Per G Tube Q6H PRN     ? albuterol  2.5 mg Nebulization Q6H WA     ? ascorbic acid  250 mg Per G Tube Daily     ? chlorhexidine gluconate  15 mL Mouth/Throat Q12H     ? clonazePAM  0.5 mg Per J Tube BID     ? insulin regular human 100 units in sodium chloride 0.9 % 100 mL   Intravenous Continuous 1.5 mL/hr at 10/30/20 1700    And   ? dextrose injection  25 g IV Push Q15 MIN PRN      And   ? dextrose injection  12.5 g IV Push Q15 MIN PRN     ? [Held by provider] doxepin  10 mg Per J Tube Nightly     ? folic acid  1 mg Per G Tube Daily     ? heparin (porcine)  7,500 Units Subcutaneous Q8H     ? HYDROmorphone  1 mg IV Push Q4H PRN     ? insulin glargine  15 Units Subcutaneous Q12H     ? [Held by provider] insulin regular human  28 Units Subcutaneous Q6H     ? miconazole nitrate   Topical (Top) BID     ? norepinephrine  0.02-3 mcg/kg/min (Dosing Weight) Intravenous Continuous 0.06 mcg/kg/min (10/30/20 1700)   ? oxyCODONE  10 mg Per G Tube Q4H PRN      Or   ? oxyCODONE  15 mg Per G Tube Q4H PRN     ? piperacillin-tazobactam  4.5 g Intravenous Q6H Stopped (10/30/20 1440)   ? propofol (DIPRIVAN) 1,000 mg in 100 mL (10 mg/mL)  5-80 mcg/kg/min Intravenous Continuous 40 mcg/kg/min (10/30/20 1700)   ? rocuronium         ? thiamine  50 mg Per G Tube Daily     ? Vancomycin MAR level   Intravenous Once     ? Vancomycin Therapy Placeholder   Intravenous placeholder     ? zinc oxide  1 Application Topical (Top) DAILY PRN         [email or web address]    Palliative Performance Scale 30%   (Modification of Karnofsky and was designed for measurement of physical status in Palliative Care. Only 10% of patients with PPS score <=50% would be expected to survive >6 months)    Physical Exam  Vitals and nursing note reviewed.   Constitutional:       General: He is not in acute distress.  Appearance: He is ill-appearing.      Interventions: He is sedated.   Pulmonary:      Effort: No respiratory distress.      Comments: Trached to vent  Neurological:      Comments: Sedated         Vitals:  Temp:  [97.34 ?F (36.3 ?C)-100.4 ?F (38 ?C)] 100.4 ?F (38 ?C)  Pulse:  [70-104] 104  Resp:  [9-40] 12  BP: (87-126)/(49-74) 87/52  SpO2:  [95 %-100 %] 95 %  ECOG Performance Status (non-calculated): 3  calc. ECOG (from Karnofsky): 2  Wt Readings from Last 3 Encounters:   10/30/20 (!) 147.1 kg   10/06/20 (!) 139.5 kg   09/29/20 (!) 142.4 kg       Review of Labs/Diagnostics  Complete Blood Count:  Recent Labs   Lab 10/28/20  0516 10/29/20  0412 10/30/20  0358   WBC 12.3* 14.3* 11.7*   HGB 8.2* 9.0* 8.9*   HCT 25.90* 28.70* 28.60*   PLT 202 226 183     Comprehensive Metabolic Panel:  Recent Labs   Lab 10/27/20  0403 10/27/20  0552 10/28/20  0516 10/28/20  0604 10/29/20  0412 10/29/20  0503 10/30/20  0358 10/30/20  0454 10/30/20  1411   NA 139   < > 141  --  144  --  142  --   --    K 5.7*   < > 4.4  --  5.3  --  5.0  --   --    CL 101   < > 104  --  105  --  106  --   --    CO2 24  --  22  --  23  --   --   --   --    BUN 60*   < > 70*  --  68*  --  72*  --   --    CREATININE 2.41*   < > 2.43*  --  2.21*  --  2.16*  --   --    GLU 377*   < > 126*   < > 131*   < > 146* - 141*   < > 158*   CALCIUM 8.7*   < > 9.3  --  8.8  --  8.6*  --   --     < > = values in this interval not displayed.     No results for input(s): ALKPHOS, BILITOT, BILIDIR, PROT, ALT, AST in the last 168 hours.    Invalid input(s): LABALBU    Diagnostics:  XR Chest PA or AP    Result Date: 10/29/2020  XR CHEST PA OR AP Date: 10/29/2020 9:23 AM INDICATION: evaluate for PNA. COMPARISON: Yesterday FINDINGS: Support Devices: Tracheostomy tube and right IJ chemotherapy port remain in place. The cardiomediastinal silhouette is stable. Worsening fluid overload. Patchy opacity in the right upper lung may represent focal edema versus pneumonia. Layering left pleural effusion. There is no pneumothorax.     Worsening fluid overload. Patchy opacity in the right upper lung may represent focal edema versus pneumonia. Layering left pleural effusion. Reported And Signed By: Maryjane Hurter, MD  [facility]        Impression/Plan:   Edis Huish is a 55 y.o. 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 Admitted to SICU on 2/9 for ventilator management following RRT on oncology floor for desaturation and increased work of breathing while on trach mask, ultimately placed back on vent. Referred for Palliative Care Consult by Attending Provider: Alric Quan, MD [phone] for supportive care.    Coping/Support:   - Ongoing assessments for psychosocial and/or spiritual needs  - Full interdisciplinary team support available as needed  - Appreciate primary team advocating with nurse management for an exemption to visitor restrictions to allow 2 visitors, as patient's sister reports mobility challenges making visitation on her own difficult  - Appreciate primary team and nursing helping facilitate communication between patient and his sister and niece    Advance Care Planning:  - Will be transitioned to DNR today following family meeting  - No advance directives or health care proxy documentation on file  - His sister and niece are listed as emergency contacts and his sister is next of kin and default proxy decision maker in the event that patient lacks capacity    Goals of Care:   - Ongoing discussions. Family aware of concern for worsening condition and overall poor prognosis, do not want to see him suffer. They elected today to make him DNR but otherwise continue with the current plan of care, as they believe this best represents what Sela Hua would choose for himself if he were able to verbalize his wishes.     Thank you for involving the Palliative Care team in the care of this patient. The Palliative Care team will continue to follow.    Patient was evaluated: in person    Time committed: > 65 minutes were spent in chart review, coordination of care, and at the bedside in patient/family counseling and education, including detailed concepts of symptom managment and goals of care/advanced care planning.    Signed:  Debbrah Alar, APRN  Palliative Care Consult Service  10/30/2020 5:11 PM

## 2020-10-31 NOTE — Plan of Care
Plan of Care Overview/ Patient Status    Pt remains sedated on Propofol, opens eyes spontaneously/to voice; when sedation weaned slightly, pt increasingly agitated/restless, attempting to sit up in bed. MAE. Follows simple commands intermittently. PERRL, 3-66mm/brisk. Oxycodone given for pain per CPOT with good effect. NSR on Tele, HR 80s-90s. Levo titrated to maintain MAP goal >65. T-Max: 100.4, PRN Tylenol given and abx continued. Trached on pressure AC, 35% FiO2, Q1-2H suctioning for moderate think creamy white-tan secretions, moderate white oral secretions at times, trach dressing changed. Lungs coarse/diminished. Tolerating TF @ goal via PEG. Abdomen soft, +BS, -BM. Foley indwelling with adequate clear-yellow UO. Insulin gtt titrated per protocol. Vanco levels sent. No repletions needed overnight. Secura cream applied to groin/buttocks excoriation. T&P Q2H. BIL soft wrist restraints in place. Safety maintained. See MAR and flowsheets for further info.

## 2020-10-31 NOTE — Other
VANCOMYCIN LEVEL EVALUATIONCurrent Vancomycin Order: 2 g IV per laboratory/placeholder data. Day of Therapy: 2Vancomycin Indication: Suspected MRSA infectionEstimated Creatinine Clearance: 45 mL/min (A) (by C-G formula based on SCr of 2.16 mg/dL (H)).Creatinine (mg/dL) Date Value 16/06/9603 2.16 (H) 10/29/2020 2.21 (H) 10/28/2020 2.43 (H)  Renal Function: AKILevel: Lab Results Last 72 Hours Component Value Date/Time  Vancomycin Random 16.3 (H) 10/30/2020 08:52 PM  Vancomycin Random 18.6 (H) 10/30/2020 09:02 AM Type of Level: Random (30 h post-dose)Based on vancomycin level obtained, recommend:  Hold dose until level appropriateRepeat Level:  2/26 @ 0600ID/AST Consulted? No [facility] Vancomycin Dosing GuidelineBH Vancomycin Dosing GuidelineFor questions, please contact the pharmacist: Sharene Butters, PharmD    Phone/Mobile Heartbeat: [facility]

## 2020-10-31 NOTE — Progress Notes
[facility]	SICU Progress NoteAttending Provider: Alric Quan, MDPost-Operative Day/Post Injury Day Procedure(s):2/5: [name] exchange (ENT) Hospital LOS: 22 days Interim History:  HPI: 55 y.o. male admitted to the SICU on 2/9 for ventilator management following an RRT on the oncology floor for desaturation and increased work of breathing while on trach mask and ultimately placed back on vent. ?Patient was initially a transfer from [facility] on 2/4 after trach and PEG when he had a partial obstruction of trach site from a new necrotic mass. He underwent trach exchange 2/5. Was on trach mask on oncology floor however had 2 episodes on floor requiring RRT for lavage and likely mucous plugging. ?Course Complicated by: - Incidental R IJ thrombus - Multiple episodes of presumed mucous plugging requiring RRT's ?PMH: Chronic ITP, Waldenstrom macroglobulinemia, HTN. HLD, DM, tobacco and alcohol use, Laryngeal SCC (weekly Cisplatin and RT)?PSH: None known ?Home meds: Lipitor, Pepcid, Lisinopril, Metformin, Oxycodone, miralax, Insulin U100 and Lantus SICU course:2/9: Arrives to SICU HDS, on vent PCV. Appears well. Transitioned to Pressure support on vent and tolerating well. O/N: Episode of patient feeling like not getting a full breath, extremely tachypenic and increased work of breathing and tachycardia, attempted suctioning and lavage without improvement, attempted to switch to PCV and VAC but due to dyssynchrony unable to tolerate, no desat but poor ventilation and TV and ETCO2 up to 100. ENT at bedside do not feel additional scope would benefit as he was just scoped a few hours ago, ultimately gave fentanyl push and started propofol for vent synchrony but despite max prop still dyssynchronous. Gave one dose of roccuronium once sedated for vent synchrony with good effect. Albuterol and HTS neb given and peaks improved from 50 to 30's. No further paralytic needed and now synchronous on VAC. Kept on propofol for vent synchrony. CXR reassuring and back to GCS 11T. Hypotension only following sedation and Rocc, Low dose Neo started. 2/10: Kept on higher sedation in AM for Aspen A/P (for staging) and soft tissue neck. Weaning sedation and will plan for PSV trial once more awake. Scoped by ENT in AM, airway patent. Q1-2hr suction requirements of thick tan secretions. No further episodes of respiratory distress although sedation remains high. Tube feeds changed to continuous given significant hyperglycemia. Insulin regimen changed per diabetes team recommendations. Pressure ulceration noted around tracheostomy --> ENT at bedside to evaluation, likely some component of invasive tumor growth. Palliative care consult for assist with symptom management and family moving forward. Primary oncologist office made aware of referral. O/N: weaning down on Prop, no episodes of respiratory distress or desats overnight. Febrile to 101.3, sputum and blood cultures ordered. 500cc LR bolus given for some oliguria and rising Cr. 2/11: Given 500cc NS for oliguria with good response. Vanco level yesterday 25, likely cause of worsening aki, on hold. UOP & lytes remains stable. Insulin adjusted per diabetes team. PSV as tolerated (doing well). CPT added. Confirmed with ENT pulm recs for blue portex suctionaid trach- no plans to switch at this time. Bowel regimen added. Plan keep on neo if low dose but if inc, consider changing to levo as likely then not in setting of sedation. Family updated on phone. Confirmed w heme- no AC rec for R IJ thrombus until plt > 50. Was going to attempt to place pt on TM overnight as he did very well on PSV x 3hrs, however, pt had anxiety attack regarding his cancer and prognosis, he requested to sleep during night and was agreeable to TM in  am. O/N: Patient agrees to PSV overnight which he did very well on. Agreeable to getting OOB to chair and possibly trach mask in AM but wants to stay on vent tonight. Off Neo by AM.  2/12: trial trach mask as tolerated this AM. Adjusted insulin regimen per the diabetes management service. Sputum now with staph, awaiting sensitivities. More loose stools today; nursing placed rectal tube and held bowel regimen.  ON: Anxious at times, feeling like he cannot breath but doing well on TM. Frequent suctioning. Increased standing. FIO2 increased to 40%.  insulin to 14U per endo reccs. 2/13: continue to adjust insulin regimen, now 20u regular q6. Cr continues to uptrend 2.57 --> 2.63 and repeat vanco level still elevated 24.6; pharmacy to continue dosing adjustments. Otherwise, remains on trach mask. Sputum with MRSA --> d/c ceftriaxone. ON: increased standing insulin to 24U. 2/14: Will discuss [facility] plan with hematology now that PLT > 50 x2 checks. Will also d/c vancomycin today (10d of therapy thus far). AKI appears to have peaked at 2.57 -> 2.63 -> 2.47. Awaiting transfer to the oncology service on NP 15. Okay to start [facility], but will hold off today given more bloody secretions. 2/15: Plan to start palliative RT today. AM session aborted due to extreme anxiety (despite premedication with IV ativan), tried again in the PM with 10mg  IV Valium + 3mg  IV ativan, procedure again aborted due to extreme anxiety and inability to tolerate. Pending discussion with rad onc in AM re: utility of ongoing attempts at RT. Pending transfer to NP15 on med onc service.  O/N: increasingly anxious / restless, but able to re-direct and calm down with PRN ativan. Liquid stools, placed rectal tube. Will start banatrol and check CBC in AM, check c dif. This had previously improved by holding bowel regimen.2/16: Acutely increased WOB, HR 130s, SBP 170s --> ENT bedside scope with trach patent, though notable secretions --> suctioned with minimal improvement; placed back on ventilator PSV 10 / +5 with immediate improvement in VS and respiratory distress. Precedex gtt started to assist more frequent suctioning. CXR with LLL consolidation --> resend sputum, and restart Vanco (close trough monitoring with AKI) given known MRSA PNA. Increased diarrhea, cdiff negative, transition to Vital TF. LR 500cc for hemoconcentration of labs, tachycardia. Appreciate radiation oncology; sedated patient with Propofol gtt, bolus x1 with good effect --> able to tolerate 15 minute radiation session; plan to repeat Thursday, Friday, Monday, and Tuesday. 2/17: Psych consulted for depression--> Doxepin added, Klonopin added. Hypernatremia--> D5W and free water flushes added. Ceftaz added for GNR in sputum. Plan for family discussion this PM (see separate note) and radiation this PM. Foley replaced for retention. ON: Tolerated PSV 15/5. Repeat Na 148.2/18: Plan for radiation this afternoon. Rectal tube removed.. Hgb 6.3--> transfused 1un PRBCs. Discussed care with sister and niece at bedside, remain full code and plan to continue radiation course through Tuesday (see family communication note).2/19: PSV weaned 15 --> 12/+5. Start Clonidine and discontinue Precedex gtt. Discontinue Gabapentin and Dilaudid IV, decrease Oxycodone to 2.5 / 5. Na improved 144, discontinue D5W; sugars should improve, maintain current regimen per DM team. GNR per micro lab not pseudomonas, klebsiella, or other enteric organism; would have speciated --> change Ceftaz to PO Cipro, to complete course 2/23 for one week. Cr again increased with Vanco level over 20 --> next dose held, monitor level closely. Hgb remains low despite 1U yesterday, 6.3 --> 7.0; start multivitamins and check with Heme. ON: Low grade fevers stable. Given ativan  0.5mg  in place of klonipin with good anxiolytic effect. Transfuse 1u PRBC (likely half unit) for Hgb 6.7.2/20: Acute respiratory distress this morning after turn, minimal secretions, did not improve with increased pressure or VAC --> bagged x5 mins with improvement, placed on PCV, IP 20 --> CXR pending, start Propofol, add back Ativan and Morphine PRN. Appreciate heme assistance with persistent anemia. Cr continues to rise following elevated vanco trough; hold any further doses, level will likely remain therapeutic to final date of course (2/22) --> unfortunately, patient did receive additional dose of Vanco 2g per pharmacy order this morning; discontinued placeholder, consider Doxycycline if Vanco level subtherapeutic prior to end date.ON: Remains on PCV. Remained on low dose levo 0.02 throughout night. Manual suctioning by RT overnight with thin tan secretions. AM labs notable for Hgb 9.1(6.7) after 1uPRBC transfusion yesterday. Worsening AKI Cr 2.51 (2.44).2/21: Patient alert and breathing comfortably this morning --> wean PEEP back to 5, consider retrial of PSV today pending radiation plans. Free water flushes discontinued, hypernatremia resolved. Worsening AKI from Vanco; since discontinued, monitor UOP. LR 250cc, wean off Levo gtt. Appreciate palliative care assistance after event yesterday, patient requesting further discussion. Lantus changed to 15U QHS, 10U QAM and bolus increased to 28U Q6.ON: Acute respiratory distress at change of shift, lost tidal volumes and desaturated to high 80s requiring bagging, recovered. Pt expressing that he cannot breathe, feels obstructed --> suctioned with 10Fr suction w/ resistance met at 20cm and minimal secretions. ENT at bedside for scope --> thick secretions noted but airway patent.. Noted that Bivona trach was retracted, advanced to proper position by ENT. Per ENT, tracheal tissue proximal to bivona tip very flimsy. Likely tissue collapsing against distal tip with trach retracted in poor position causing obstruction. Patient acutely distressed and agitated during event, propofol gtt restarted will plan to wean off tonight. Start decadron 8mg  q12 per onc recs for peritumoral inflammation. Hyperkalemic in AM to 5.7 --> EKG unchanged, insulin/dextrose/Ca2+ x 1. Transfuse 1u PRBC for hgb 6.9. 2/22: Keep on PCV today and continue propofol gtt. Start Insulin gtt and increase Lantus to 20u BID. No bowel movements --> discontinue banatrol. Plan for radiation today. Discussed transfer to MICU no beds available, will initiate transfer once a bed becomes available. Plan for multidisciplinary family meeting this week. ON: Rested on PCV, low dose prop. WBC count 12 (7) this am2/23: Plan for last palliative radiation session later today. RR decreased to 12. Given rising white count and increased in line suctioning requirement Sputum culture sent and CXR obtained. Will stop Cipro and start ceftriaxone. 1L LR bolus given for AKI, rising BUN as well as likely concentration on CBC. Plan to start weaning off prop after radiation and attempt PSV trials again. Family discussion with all teams Thursday or Friday. CPT added back given increased secretions attempted to add back HTS nebs however hospital is completely out per pharmacy discussion. Respiratory culture again growing GPC and GNR. ON: BG stable on ins gtt. NAE2/24: This AM, less responsive (GCS 10T) and BP down to 60/40s, but also worsening vent synchrony --> increased oxycodone and dilaudid followed by 1000 LR bolus with good effect and pan-cx given worsening leukocytosis. Started vanc / zosyn.  Later, self d/c bivona trach and desat to 60s; SICU attending at bedside and replaced bivona trach without difficulty and placed back on the ventilator.  ENT at bedside to scope and confirmed trach positioning. Will plan for family meeting tomorrow. ON: NAE2/25: GCS 11T, but still withdrawn / lethargic. Will  d/c PRN ativan to minimize sedation and continue to wean propofol as tolerated, though patient has been mentating on higher doses than this. Still low dose levo requiremements. Bivona trach dislodged ~4cm today, obturator placed and re-advanced without issue. Trial PSV 15/5, tolerating with Tv 400s. D/w ENT, will try to work on custom trach for him. Plan for family meeting this afternoon for goals of care discussions. Rested on [facility] settings after ~6 hours of 15/5 (work of breathing + tachypnea 40s). Decreased lantus to 15 BID per endocrine. In afternoon, became more agitated / tachy requiring additional PRN 1mg  ativan + 1mg  versed + dilaudid. See ACP note for family meeting discussion. O/N: NAE. 2/26: Attempt to wean propofol to 35, increased agitation and tachypnea --> increase Prop back to 40 and oxycodone given with improvement. Plan for PSV 15/5 x 4 hours today as tolerated. Increased Lantus to 20u BID. Discontinued Zosyn. Discussed with pharmacy switching Vanco to Clinda given worsening renal function --> recommend continuing Vanco as high risk of C.diff with Clinda, will recheck random vanco level tomorrow @ 0500. 1L NS given for rise in creatinine. Review of Allergies/Meds/Hx: I have reviewed the patient's: allergies, past medical history, past surgical history, family history, social history, prior to admission medicationsObjective: Vitals:I have reviewed the patient's current vital signs as documented in the EMRTemp:  [97.34 ?F (36.3 ?C)-100.4 ?F (38 ?C)] 99.68 ?F (37.6 ?C)Pulse:  [70-145] 91Resp:  [9-40] 16BP: (87-126)/(49-72) 120/60SpO2:  [95 %-100 %] 99 %SpO2: 99 %Device (Oxygen Therapy): mechanical ventilatorI/O's:I have reviewed the patient's current I&O's as documented in the EMR.Gross Totals (Last 24 hours) at 10/31/2020 0020Last data filed at 10/31/2020 0000Intake 3752.36 ml Output 2890 ml Net 862.36 ml Physical ExamVitals reviewed. Constitutional:     General: Anxious gentleman appearing older than stated age. HENT:    Head: Normocephalic and atraumatic.    Mouth: Mucous membranes are moist.    Extraocular Movements: Extraocular movements intact.    Pupils: Pupils are equal, round, and reactive to light.    Comments: Tracheostomy in placeCardiovascular:    Rate and Rhythm: Regular rhythm. Regular rate   Heart sounds: No murmur heard. Pulmonary:    Breath sounds: No wheezing, rhonchi or rales.    Comments: Ventilated via trachAbdominal:    General: There is no distension.    Tenderness: There is no abdominal tenderness.    Comments: PEG tube in place with TFs runningGenitourinary:   Comments: Foley in place draining clear, yellow urineMusculoskeletal:    Lower legs: Mild edema   Comments: Moves all extremities equally, to command.Skin:   General: Skin is warm and dry.    Capillary Refill: Capillary refill takes less than 2 seconds. Neurological:    General: No focal deficit present.    Mental Status: He is alert.    Cranial Nerves: No cranial nerve deficit.    Motor: No weakness. Labs: Refer to EMR. I have reviewed the patient's labs within the last 24 hours; see assessment and plan below for significant abnormals.Microbiology:Refer to EMR. I have reviewed all new results within the last 24 hours; see assessment and plan below.Diagnostics:Refer to EMR. I have reviewed all new results within the last 24 hours; see assessment and plan below.Chest ZO:XWRUE to EMR. I have reviewed all new results within the last 24 hours; see assessment and plan below.Abdominal AV:WUJWJ to EMR. I have reviewed all new results within the last 24 hours; see assessment and plan below.ECG/Tele Events: I have reviewed the patient's ECG and telemetry as resulted in  the EMR.Assessment/Plan Neurologic: Analgesia/sedation - Tylenol 650 Q6 PRN - Oxycodone 10 / 15 Q4 PRN - Dilaudid 1 PRN breakthrough- Propofol gtt, wean as able  Anxiety- s/p clonidine taper (stopped early 2/2 hypotension)- Clonazepam 0.5mg  BID, consider d/c if mental status continues to be depressed- Doxepin 10mg  QHS- Ativan 0.5mg  IV Q6 PRN acute agitation- Appreciate palliative care and psych consults?ENT: L laryngeal SCC, invading tracheostomy: s/p trach exchange 10/10/20 and biopsy 10/12/20- Bivona trach to bypass obstruction 	- F/u with ENT re: custom Bivona trach- Unfortunately, there are no surgical / curative options- Palliative radiation (2/16, 2/17, 2/18, 2/21, 2/22, 2/23)	- Required PCV with full propofol sedation to tolerate?Cardiac: Septic shock- MAP > 65	- Norepinephrine gtt if needed (2/24- present)Hx of HTN / HLD- Hold home lisinopril, lasix, crestor given AKI?Respiratory:  Respiratory failure (circumferential tumor invasion of stoma, mucous plugging / trach occlusion)- PC/AC: IP 20, PEEP +5- s/p Decadron 8mg  BID for tumor inflammation (2/22 -2/24) per med oncology- Albuterol Q6- CPT 4 times daily - Could consider mucomyst nebs- Attempted to add back HTS nebs however hospital is completely out per pharmacy discussionGI: Diarrhea, resolved - PEG prior to admission- TF: Vital @ 95cc/hr; while on propofol --> peptamen at 85cc/hr?Renal: AKI on CKD (baseline Cr 1.5-1.6), 2/2 supratherapeutic vancomycin level, improving - Cr continues to rise, continue to trend daily- Foley replaced 2/17 for urinary retention- 1L NS today ?Infectious Disease:  Concern for new pneumonia- 2/24 sputum: GNR- 2/23 sputum: MRSA- Vanco  (2/24- present)	- Random Vanco level 2/27 pending - S/p Zosyn (2/24- 2/26)Hx MRSA PNA; 4+ GNR PNA- s/p Vanco (2/5 - 2/14, 2/16 - 2/22)- s/p Ceftaz (2/17- 2/19)- s/p Cipro (2/19 - 2/23)?Hematology:Critical illness anemia- Sanguinous secretions, expected given tumor burden and coagulopathy, may worsen with radiation and baseline hematologic disease- Multivitamins and cofactors- Maintain Hgb >7?Hx of chronic ITP, Waldenstrom macroglobulinemia- Transfuse platelets <50 if bleeding, <10 if no active bleeding- Appreciate hematology recommendations?R IJ thrombus - High risk heparin gtt when plt consistently >50 and no more bloody secretions from tracheostomy- HSQ- Maintain SCDs at all times?Endocrine: Hx T2DM- Lantus 20 BID - Insulin gtt - HOLD home Metformin, Lantus 64u, Apidra 20u TID w meals- Appreciate diabetes team recsMusculoskeletal: Generalized deconditioning- OOB daily, PT/OT evals- Turn and reposition Q2 hours, PUP dressing?Device Review:- R IJ Port (08/12/20) per guidelines, nonocclusive DVT does not require removal of line- PEG (09/14/20)- Bivona trach (10/10/20)?Code Status / Dispo:- Transition to DNR (see ACP note 2/25)- Per ENT, patient to transfer to medical oncology after SICU- Appreciate palliative care assistance with GOC discussionsNotifications : For questions please call SICU Covering Provider in Epic EMR (primary) SICU M[facility] Dynamic Roles (secondary)Y[facility] 7-1 APP			[phone] 6-1 Frontside		[phone] 6-1 Backside		[phone] Verdi-2 APP			[phone]:Billyjack Trompeter, PA-C SICU APP2/26/2022

## 2020-11-01 LAB — CBC WITH AUTO DIFFERENTIAL
BKR WAM ABSOLUTE IMMATURE GRANULOCYTES.: 0.57 x 1000/ÂµL — ABNORMAL HIGH (ref 0.00–0.30)
BKR WAM ABSOLUTE LYMPHOCYTE COUNT.: 0.18 x 1000/ÂµL — ABNORMAL LOW (ref 0.60–3.70)
BKR WAM ABSOLUTE NRBC (2 DEC): 0 x 1000/ÂµL (ref 0.00–1.00)
BKR WAM ANALYZER ANC: 8.29 x 1000/ÂµL — ABNORMAL HIGH (ref 2.00–7.60)
BKR WAM BASOPHIL ABSOLUTE COUNT.: 0.02 x 1000/ÂµL (ref 0.00–1.00)
BKR WAM BASOPHILS: 0.2 % (ref 0.0–1.4)
BKR WAM EOSINOPHIL ABSOLUTE COUNT.: 0.52 x 1000/ÂµL (ref 0.00–1.00)
BKR WAM EOSINOPHILS: 5 % (ref 0.0–5.0)
BKR WAM HEMATOCRIT (2 DEC): 27.8 % — ABNORMAL LOW (ref 38.50–50.00)
BKR WAM HEMOGLOBIN: 8.1 g/dL — ABNORMAL LOW (ref 13.2–17.1)
BKR WAM IMMATURE GRANULOCYTES: 5.5 % — ABNORMAL HIGH (ref 0.0–1.0)
BKR WAM LYMPHOCYTES: 1.7 % — ABNORMAL LOW (ref 17.0–50.0)
BKR WAM MCH (PG): 26.1 pg — ABNORMAL LOW (ref 27.0–33.0)
BKR WAM MCHC: 29.1 g/dL — ABNORMAL LOW (ref 31.0–36.0)
BKR WAM MCV: 89.7 fL (ref 80.0–100.0)
BKR WAM MONOCYTE ABSOLUTE COUNT.: 0.85 x 1000/ÂµL (ref 0.00–1.00)
BKR WAM MONOCYTES: 8.1 % (ref 4.0–12.0)
BKR WAM MPV: 11.8 fL (ref 8.0–12.0)
BKR WAM NEUTROPHILS: 79.5 % — ABNORMAL HIGH (ref 39.0–72.0)
BKR WAM NUCLEATED RED BLOOD CELLS: 0 % (ref 0.0–1.0)
BKR WAM PLATELETS: 86 x1000/ÂµL — ABNORMAL LOW (ref 150–420)
BKR WAM RDW-CV: 17.8 % — ABNORMAL HIGH (ref 11.0–15.0)
BKR WAM RED BLOOD CELL COUNT.: 3.1 M/ÂµL — ABNORMAL LOW (ref 4.00–6.00)
BKR WAM WHITE BLOOD CELL COUNT: 10.4 x1000/ÂµL (ref 4.0–11.0)

## 2020-11-01 LAB — BASIC METABOLIC PANEL
BKR ANION GAP: 11 (ref 7–17)
BKR BLOOD UREA NITROGEN: 70 mg/dL — ABNORMAL HIGH (ref 6–20)
BKR BUN / CREAT RATIO: 29.9 — ABNORMAL HIGH (ref 8.0–23.0)
BKR CALCIUM: 9.3 mg/dL (ref 8.8–10.2)
BKR CHLORIDE: 110 mmol/L — ABNORMAL HIGH (ref 98–107)
BKR CO2: 21 mmol/L (ref 20–30)
BKR CREATININE: 2.34 mg/dL — ABNORMAL HIGH (ref 0.40–1.30)
BKR EGFR (AFR AMER): 35 mL/min/{1.73_m2} (ref >60)
BKR EGFR (NON AFRICAN AMERICAN): 29 mL/min/{1.73_m2} (ref >60)
BKR GLUCOSE: 144 mg/dL — ABNORMAL HIGH (ref 70–100)
BKR POTASSIUM: 5.1 mmol/L (ref 3.3–5.3)
BKR SODIUM: 142 mmol/L (ref 136–144)

## 2020-11-01 LAB — PHOSPHORUS     (BH GH L LMW YH): BKR PHOSPHORUS: 4.8 mg/dL — ABNORMAL HIGH (ref 2.2–4.5)

## 2020-11-01 LAB — LOWER RESP CULTURE, QUAL: BKR LOWER RESPIRATORY CULTURE (QUAL): NORMAL

## 2020-11-01 LAB — IMMATURE PLATELET FRACTION (BH GH LMW YH)
BKR WAM IPF, ABSOLUTE: 3.7 x1000/ÂµL (ref <20.0)
BKR WAM IPF: 4.3 % (ref 1.2–8.6)

## 2020-11-01 LAB — VANCOMYCIN, RANDOM     (BH GH LMW YH): BKR VANCOMYCIN RANDOM: 21.4 ug/mL — ABNORMAL HIGH

## 2020-11-01 LAB — MAGNESIUM: BKR MAGNESIUM: 2.7 mg/dL — ABNORMAL HIGH (ref 1.7–2.4)

## 2020-11-01 MED ORDER — FRUIT JUICE
ORAL | Status: DC | PRN
Start: 2020-11-01 — End: 2020-11-03

## 2020-11-01 MED ORDER — LACTATED RINGERS IV BOLUS (NEW BAG)
Freq: Once | INTRAVENOUS | Status: CP
Start: 2020-11-01 — End: ?
  Administered 2020-11-01: 16:00:00 1000.000 mL/h via INTRAVENOUS

## 2020-11-01 MED ORDER — DEXTROSE 10 % IV BOLUS FOR ORDERABLE
INTRAVENOUS | Status: DC | PRN
Start: 2020-11-01 — End: 2020-11-03

## 2020-11-01 MED ORDER — INSULIN GLARGINE 100 UNIT/ML SUBCUTANEOUS SOLUTION
100 unit/mL | Freq: Once | SUBCUTANEOUS | Status: CP
Start: 2020-11-01 — End: ?
  Administered 2020-11-01: 16:00:00 100 mL via SUBCUTANEOUS

## 2020-11-01 MED ORDER — INSULIN GLARGINE 100 UNIT/ML SUBCUTANEOUS SOLUTION
100 unit/mL | Freq: Two times a day (BID) | SUBCUTANEOUS | Status: DC
Start: 2020-11-01 — End: 2020-11-02
  Administered 2020-11-02 (×2): 100 mL via SUBCUTANEOUS

## 2020-11-01 MED ORDER — DEXTROSE 40 % ORAL GEL
40 % | ORAL | Status: DC | PRN
Start: 2020-11-01 — End: 2020-11-03

## 2020-11-01 MED ORDER — DOXYCYCLINE TAB/CAP 100 MG (WRAPPED E-RX)
100 mg | Freq: Two times a day (BID) | ORAL | Status: DC
Start: 2020-11-01 — End: 2020-11-01

## 2020-11-01 MED ORDER — VANCOMYCIN MAR LEVEL
Freq: Once | INTRAVENOUS | Status: DC
Start: 2020-11-01 — End: 2020-11-01

## 2020-11-01 MED ORDER — INSULIN U-100 REGULAR HUMAN 100 UNIT/ML (SLIDING SCALE)
100 unit/mL | Freq: Four times a day (QID) | SUBCUTANEOUS | Status: DC
Start: 2020-11-01 — End: 2020-11-02
  Administered 2020-11-01 – 2020-11-02 (×3): 100 mL via SUBCUTANEOUS

## 2020-11-01 MED ORDER — SKIM MILK
ORAL | Status: DC | PRN
Start: 2020-11-01 — End: 2020-11-03

## 2020-11-01 MED ORDER — DOXYCYCLINE TAB/CAP 100 MG (WRAPPED E-RX)
100 mg | Freq: Two times a day (BID) | GASTROSTOMY | Status: CP
Start: 2020-11-01 — End: ?
  Administered 2020-11-01 – 2020-11-08 (×14): 100 mg via GASTROSTOMY

## 2020-11-01 MED ORDER — GLUCAGON 1 MG/ML IN STERILE WATER
Freq: Once | INTRAMUSCULAR | Status: DC | PRN
Start: 2020-11-01 — End: 2020-11-18

## 2020-11-01 NOTE — Progress Notes
Due to the complex nature of this patient's critical illness I have seen and evaluated this patient and formulated the following plan in addition to the advanced practice provider's care. I have seen and evaluated Arthur [name] and agree with the findings and plan as documented in the APP's note with the following changes. I have also reviewed all the pertinent labs and imaging.CV: hemodynamics: now with hypotension and tachycardia, consistent with sepsis. Pressors: levophedArrhythmia: no			Antihypertensives:noShock: septicPulmonary:Ongoing thick secretions but not as frequent, now with infiltrates on CXRIncreased rate but not triggering full breaths, appears air hungry. Will potentially benefit from adjustment to flow rate. Getting appropriate volumes with regular breaths, no high airway pressures. Continue nebulizersRegular suctioning. GI/Nutrition:Tube feeds: via G tubeContinue Vital AFNo bm x 2 days- d/c BanatrolProtein-calorie malnutrition: yesRenal:AKI: yesMaintain foley for now while on sedation/pressors/RT treatmentElectrolyte abnormalities: hyperkalmia correctedEndocrine:Glycemic control goal 120-180 now on Lantus and insulin drip, Diabetes following, mildly worse due to dextrose given with insulin for hyperkalemia. Now with hypoglycemia this am, will not increase lantus, continue 20 bid for now with ongoing feeds. Dexamethasone initiated to mitigate side effects of radiation, last day 25thHeme:Acute blood loss anemia without ongoing bleeding. Transfused this am again for anemia with mild hypotensionPlatelets now higher while on steroidsKnown IJ thrombus related to device, no plan for anticoagulation due to high risk of bleeding from tumor despite now normal levels of platelets.Red cell production augmented with cofactors.DVT ppx: sq heparinID:Abx: resumed broad coverageGPC and GN rodsRecurrent MRSA pneumonia is likely with other gram negativesSepsis: yesNeuro:Sedation: propofol for discomfort related to obstructive symptoms from the tracheostomy, overall pain and radiation treatmentPain control:Brain compression: No		Traumatic Brain Injury: NoDelirium: noPt intermittently clearly emotionally distressed due to his poor prognosis, stress of radiation. Continue Doxepin, propofol, PRN ativanMSK/skin:Dispo: No surgical options, plan for now is to continue radiation and palliative treatment. Oncology discussed with him potential for chemotherapy if he recovers to the point of ventilator liberation and [name] discharge, pt very encouraged by this despite frequent airway obstruction events, ventilator dependence and significant tumor burden. Given this plan care will be turned over to the medical ICU and the medical oncology service. DVT ppx: sq heparin		GI ppx: PPIFamily present at bedside: noLucy Lyberti [name], MDTrauma, Surgical Critical Care, and Acute Care Surgery2/24/2022This patient is critically ill as indicated by the following Acute Respiratory Failure and End of life careThe patient has required 40 minutes of my undivided attention during the past 24 hours for one of the previous mentioned life threatening diagnoses. The critical care interventions have included: Management of Acute Respiratory Failure with ongoing ventilator and airway management including suctioning and Support of intensive care management with consulting services including Palliative Care and Oncology, ENT, end of life care.

## 2020-11-01 NOTE — Progress Notes
[facility]	SICU Progress NoteAttending Provider: Alric Quan, MDPost-Operative Day/Post Injury Day Procedure(s):2/5: [name] exchange (ENT) Hospital LOS: 23 days Interim History:  HPI: 55 y.o. male admitted to the SICU on 2/9 for ventilator management following an RRT on the oncology floor for desaturation and increased work of breathing while on trach mask and ultimately placed back on vent. ?Patient was initially a transfer from [facility] on 2/4 after trach and PEG when he had a partial obstruction of trach site from a new necrotic mass. He underwent trach exchange 2/5. Was on trach mask on oncology floor however had 2 episodes on floor requiring RRT for lavage and likely mucous plugging. ?Course Complicated by: - Incidental R IJ thrombus - Multiple episodes of presumed mucous plugging requiring RRT's ?PMH: Chronic ITP, Waldenstrom macroglobulinemia, HTN. HLD, DM, tobacco and alcohol use, Laryngeal SCC (weekly Cisplatin and RT)?PSH: None known ?Home meds: Lipitor, Pepcid, Lisinopril, Metformin, Oxycodone, miralax, Insulin U100 and Lantus SICU course:2/9: Arrives to SICU HDS, on vent PCV. Appears well. Transitioned to Pressure support on vent and tolerating well. O/N: Episode of patient feeling like not getting a full breath, extremely tachypenic and increased work of breathing and tachycardia, attempted suctioning and lavage without improvement, attempted to switch to PCV and VAC but due to dyssynchrony unable to tolerate, no desat but poor ventilation and TV and ETCO2 up to 100. ENT at bedside do not feel additional scope would benefit as he was just scoped a few hours ago, ultimately gave fentanyl push and started propofol for vent synchrony but despite max prop still dyssynchronous. Gave one dose of roccuronium once sedated for vent synchrony with good effect. Albuterol and HTS neb given and peaks improved from 50 to 30's. No further paralytic needed and now synchronous on VAC. Kept on propofol for vent synchrony. CXR reassuring and back to GCS 11T. Hypotension only following sedation and Rocc, Low dose Neo started. 2/10: Kept on higher sedation in AM for [place] A/P (for staging) and soft tissue neck. Weaning sedation and will plan for PSV trial once more awake. Scoped by ENT in AM, airway patent. Q1-2hr suction requirements of thick tan secretions. No further episodes of respiratory distress although sedation remains high. Tube feeds changed to continuous given significant hyperglycemia. Insulin regimen changed per diabetes team recommendations. Pressure ulceration noted around tracheostomy --> ENT at bedside to evaluation, likely some component of invasive tumor growth. Palliative care consult for assist with symptom management and family moving forward. Primary oncologist office made aware of referral. O/N: weaning down on Prop, no episodes of respiratory distress or desats overnight. Febrile to 101.3, sputum and blood cultures ordered. 500cc LR bolus given for some oliguria and rising Cr. 2/11: Given 500cc NS for oliguria with good response. Vanco level yesterday 25, likely cause of worsening aki, on hold. UOP & lytes remains stable. Insulin adjusted per diabetes team. PSV as tolerated (doing well). CPT added. Confirmed with ENT pulm recs for blue portex suctionaid trach- no plans to switch at this time. Bowel regimen added. Plan keep on neo if low dose but if inc, consider changing to levo as likely then not in setting of sedation. Family updated on phone. Confirmed w heme- no AC rec for R IJ thrombus until plt > 50. Was going to attempt to place pt on TM overnight as he did very well on PSV x 3hrs, however, pt had anxiety attack regarding his cancer and prognosis, he requested to sleep during night and was agreeable to TM in  am. O/N: Patient agrees to PSV overnight which he did very well on. Agreeable to getting OOB to chair and possibly trach mask in AM but wants to stay on vent tonight. Off Neo by AM.  2/12: trial trach mask as tolerated this AM. Adjusted insulin regimen per the diabetes management service. Sputum now with staph, awaiting sensitivities. More loose stools today; nursing placed rectal tube and held bowel regimen.  ON: Anxious at times, feeling like he cannot breath but doing well on TM. Frequent suctioning. Increased standing. FIO2 increased to 40%.  insulin to 14U per endo reccs. 2/13: continue to adjust insulin regimen, now 20u regular q6. Cr continues to uptrend 2.57 --> 2.63 and repeat vanco level still elevated 24.6; pharmacy to continue dosing adjustments. Otherwise, remains on trach mask. Sputum with MRSA --> d/c ceftriaxone. ON: increased standing insulin to 24U. 2/14: Will discuss [facility] plan with hematology now that PLT > 50 x2 checks. Will also d/c vancomycin today (10d of therapy thus far). AKI appears to have peaked at 2.57 -> 2.63 -> 2.47. Awaiting transfer to the oncology service on NP 15. Okay to start [facility], but will hold off today given more bloody secretions. 2/15: Plan to start palliative RT today. AM session aborted due to extreme anxiety (despite premedication with IV ativan), tried again in the PM with 10mg  IV Valium + 3mg  IV ativan, procedure again aborted due to extreme anxiety and inability to tolerate. Pending discussion with rad onc in AM re: utility of ongoing attempts at RT. Pending transfer to NP15 on med onc service.  O/N: increasingly anxious / restless, but able to re-direct and calm down with PRN ativan. Liquid stools, placed rectal tube. Will start banatrol and check CBC in AM, check c dif. This had previously improved by holding bowel regimen.2/16: Acutely increased WOB, HR 130s, SBP 170s --> ENT bedside scope with trach patent, though notable secretions --> suctioned with minimal improvement; placed back on ventilator PSV 10 / +5 with immediate improvement in VS and respiratory distress. Precedex gtt started to assist more frequent suctioning. CXR with LLL consolidation --> resend sputum, and restart Vanco (close trough monitoring with AKI) given known MRSA PNA. Increased diarrhea, cdiff negative, transition to Vital TF. LR 500cc for hemoconcentration of labs, tachycardia. Appreciate radiation oncology; sedated patient with Propofol gtt, bolus x1 with good effect --> able to tolerate 15 minute radiation session; plan to repeat Thursday, Friday, Monday, and Tuesday. 2/17: Psych consulted for depression--> Doxepin added, Klonopin added. Hypernatremia--> D5W and free water flushes added. Ceftaz added for GNR in sputum. Plan for family discussion this PM (see separate note) and radiation this PM. Foley replaced for retention. ON: Tolerated PSV 15/5. Repeat Na 148.2/18: Plan for radiation this afternoon. Rectal tube removed.. Hgb 6.3--> transfused 1un PRBCs. Discussed care with sister and niece at bedside, remain full code and plan to continue radiation course through Tuesday (see family communication note).2/19: PSV weaned 15 --> 12/+5. Start Clonidine and discontinue Precedex gtt. Discontinue Gabapentin and Dilaudid IV, decrease Oxycodone to 2.5 / 5. Na improved 144, discontinue D5W; sugars should improve, maintain current regimen per DM team. GNR per micro lab not pseudomonas, klebsiella, or other enteric organism; would have speciated --> change Ceftaz to PO Cipro, to complete course 2/23 for one week. Cr again increased with Vanco level over 20 --> next dose held, monitor level closely. Hgb remains low despite 1U yesterday, 6.3 --> 7.0; start multivitamins and check with Heme. ON: Low grade fevers stable. Given ativan  0.5mg  in place of klonipin with good anxiolytic effect. Transfuse 1u PRBC (likely half unit) for Hgb 6.7.2/20: Acute respiratory distress this morning after turn, minimal secretions, did not improve with increased pressure or VAC --> bagged x5 mins with improvement, placed on PCV, IP 20 --> CXR pending, start Propofol, add back Ativan and Morphine PRN. Appreciate heme assistance with persistent anemia. Cr continues to rise following elevated vanco trough; hold any further doses, level will likely remain therapeutic to final date of course (2/22) --> unfortunately, patient did receive additional dose of Vanco 2g per pharmacy order this morning; discontinued placeholder, consider Doxycycline if Vanco level subtherapeutic prior to end date.ON: Remains on PCV. Remained on low dose levo 0.02 throughout night. Manual suctioning by RT overnight with thin tan secretions. AM labs notable for Hgb 9.1(6.7) after 1uPRBC transfusion yesterday. Worsening AKI Cr 2.51 (2.44).2/21: Patient alert and breathing comfortably this morning --> wean PEEP back to 5, consider retrial of PSV today pending radiation plans. Free water flushes discontinued, hypernatremia resolved. Worsening AKI from Vanco; since discontinued, monitor UOP. LR 250cc, wean off Levo gtt. Appreciate palliative care assistance after event yesterday, patient requesting further discussion. Lantus changed to 15U QHS, 10U QAM and bolus increased to 28U Q6.ON: Acute respiratory distress at change of shift, lost tidal volumes and desaturated to high 80s requiring bagging, recovered. Pt expressing that he cannot breathe, feels obstructed --> suctioned with 10Fr suction w/ resistance met at 20cm and minimal secretions. ENT at bedside for scope --> thick secretions noted but airway patent.. Noted that Bivona trach was retracted, advanced to proper position by ENT. Per ENT, tracheal tissue proximal to bivona tip very flimsy. Likely tissue collapsing against distal tip with trach retracted in poor position causing obstruction. Patient acutely distressed and agitated during event, propofol gtt restarted will plan to wean off tonight. Start decadron 8mg  q12 per onc recs for peritumoral inflammation. Hyperkalemic in AM to 5.7 --> EKG unchanged, insulin/dextrose/Ca2+ x 1. Transfuse 1u PRBC for hgb 6.9. 2/22: Keep on PCV today and continue propofol gtt. Start Insulin gtt and increase Lantus to 20u BID. No bowel movements --> discontinue banatrol. Plan for radiation today. Discussed transfer to MICU no beds available, will initiate transfer once a bed becomes available. Plan for multidisciplinary family meeting this week. ON: Rested on PCV, low dose prop. WBC count 12 (7) this am2/23: Plan for last palliative radiation session later today. RR decreased to 12. Given rising white count and increased in line suctioning requirement Sputum culture sent and CXR obtained. Will stop Cipro and start ceftriaxone. 1L LR bolus given for AKI, rising BUN as well as likely concentration on CBC. Plan to start weaning off prop after radiation and attempt PSV trials again. Family discussion with all teams Thursday or Friday. CPT added back given increased secretions attempted to add back HTS nebs however hospital is completely out per pharmacy discussion. Respiratory culture again growing GPC and GNR. ON: BG stable on ins gtt. NAE2/24: This AM, less responsive (GCS 10T) and BP down to 60/40s, but also worsening vent synchrony --> increased oxycodone and dilaudid followed by 1000 LR bolus with good effect and pan-cx given worsening leukocytosis. Started vanc / zosyn.  Later, self d/c bivona trach and desat to 60s; SICU attending at bedside and replaced bivona trach without difficulty and placed back on the ventilator.  ENT at bedside to scope and confirmed trach positioning. Will plan for family meeting tomorrow. ON: NAE2/25: GCS 11T, but still withdrawn / lethargic. Will  d/c PRN ativan to minimize sedation and continue to wean propofol as tolerated, though patient has been mentating on higher doses than this. Still low dose levo requiremements. Bivona trach dislodged ~4cm today, obturator placed and re-advanced without issue. Trial PSV 15/5, tolerating with Tv 400s. D/w ENT, will try to work on custom trach for him. Plan for family meeting this afternoon for goals of care discussions. Rested on [facility] settings after ~6 hours of 15/5 (work of breathing + tachypnea 40s). Decreased lantus to 15 BID per endocrine. In afternoon, became more agitated / tachy requiring additional PRN 1mg  ativan + 1mg  versed + dilaudid. See ACP note for family meeting discussion. O/N: NAE. 2/26: Attempt to wean propofol to 35, increased agitation and tachypnea --> increase Prop back to 40 and oxycodone given with improvement. Plan for PSV 15/5 x 4 hours today as tolerated. Increased Lantus to 20u BID. Discontinued Zosyn. Discussed with pharmacy switching Vanco to Clinda given worsening renal function --> recommend continuing Vanco as high risk of C.diff with Clinda, will recheck random vanco level tomorrow @ 0500. 1L NS given for rise in creatinine. O/N: Vanc trough 21.4. 2/27: Plan for PSV 15/5 x 6 hours as tolerated. 1L LR given for high insensible losses. Change Vanco to PO Doxy. Increase Lantus to 30u BID, d/c insulin gtt and start HDISS.  Review of Allergies/Meds/Hx: I have reviewed the patient's: allergies, past medical history, past surgical history, family history, social history, prior to admission medicationsObjective: Vitals:I have reviewed the patient's current vital signs as documented in the EMRTemp:  [99.32 ?F (37.4 ?C)-100.4 ?F (38 ?C)] 99.32 ?F (37.4 ?C)Pulse:  [81-115] 81Resp:  [0-44] 15BP: (92-126)/(46-63) 112/54SpO2:  [96 %-100 %] 98 %SpO2: 98 %Device (Oxygen Therapy): mechanical ventilatorI/O's:I have reviewed the patient's current I&O's as documented in the EMR.Gross Totals (Last 24 hours) at 11/01/2020 0016Last data filed at 11/01/2020 0000Intake 5330.67 ml Output 3050 ml Net 2280.67 ml Physical ExamVitals reviewed. Constitutional:     General: Anxious gentleman appearing older than stated age. HENT:    Head: Normocephalic and atraumatic.    Mouth: Mucous membranes are moist.    Extraocular Movements: Extraocular movements intact.    Pupils: Pupils are equal, round, and reactive to light.    Comments: Tracheostomy in placeCardiovascular:    Rate and Rhythm: Regular rhythm. Regular rate   Heart sounds: No murmur heard. Pulmonary:    Breath sounds: No wheezing, rhonchi or rales.    Comments: Ventilated via trachAbdominal:    General: There is no distension.    Tenderness: There is no abdominal tenderness.    Comments: PEG tube in place with TFs runningGenitourinary:   Comments: Foley in place draining clear, yellow urineMusculoskeletal:    Lower legs: Mild edema   Comments: Moves all extremities equally, to command.Skin:   General: Skin is warm and dry.    Capillary Refill: Capillary refill takes less than 2 seconds. Neurological:    General: No focal deficit present.    Mental Status: He is alert.    Cranial Nerves: No cranial nerve deficit.    Motor: No weakness. Labs: Refer to EMR. I have reviewed the patient's labs within the last 24 hours; see assessment and plan below for significant abnormals.Microbiology:Refer to EMR. I have reviewed all new results within the last 24 hours; see assessment and plan below.Diagnostics:Refer to EMR. I have reviewed all new results within the last 24 hours; see assessment and plan below.Chest UJ:WJXBJ to EMR. I have reviewed all new results within the last  24 hours; see assessment and plan below.Abdominal ZO:XWRUE to EMR. I have reviewed all new results within the last 24 hours; see assessment and plan below.ECG/Tele Events: I have reviewed the patient's ECG and telemetry as resulted in the EMR.Assessment/Plan Neurologic: Analgesia/sedation - Tylenol 650 Q6 PRN - Oxycodone 10 / 15 Q4 PRN - Dilaudid 1 PRN breakthrough- Propofol gtt, wean as able Anxiety- s/p clonidine taper (stopped early 2/2 hypotension)- Clonazepam 0.5mg  BID, consider d/c if mental status continues to be depressed- Doxepin 10mg  QHS- Ativan 0.5mg  IV Q6 PRN acute agitation- Appreciate palliative care and psych consults?ENT: L laryngeal SCC, invading tracheostomy: s/p trach exchange 10/10/20 and biopsy 10/12/20- Bivona trach to bypass obstruction 	- F/u with ENT re: custom Bivona trach- Unfortunately, there are no surgical / curative options- Palliative radiation (2/16, 2/17, 2/18, 2/21, 2/22, 2/23)	- Required PCV with full propofol sedation to tolerate?Cardiac: Septic shock- MAP > 65	- Norepinephrine gtt if needed (2/24- present)Hx of HTN / HLD- Hold home lisinopril, lasix, crestor given AKI?Respiratory:  Respiratory failure (circumferential tumor invasion of stoma, mucous plugging / trach occlusion)- PC/AC: IP 20, PEEP +5- s/p Decadron 8mg  BID for tumor inflammation (2/22 -2/24) per med oncology- Albuterol Q6- CPT 4 times daily - Could consider mucomyst nebs- Attempted to add back HTS nebs however hospital is completely out per pharmacy discussionGI: Diarrhea, resolved - PEG prior to admission- TF: Vital @ 95cc/hr; while on propofol --> peptamen at 85cc/hr?Renal: AKI on CKD (baseline Cr 1.5-1.6), 2/2 supratherapeutic vancomycin level, improving - Cr continues to rise, continue to trend daily- Foley replaced 2/17 for urinary retention- 1L LR today ?Infectious Disease:  Concern for new pneumonia- 2/24 sputum: GNR- 2/23 sputum: MRSA- Vanco  (2/24- 2/27), de-escalate to Doxy (2/27 -   ) plan ~10d course - S/p Zosyn (2/24- 2/26)Hx MRSA PNA; 4+ GNR PNA- s/p Vanco (2/5 - 2/14, 2/16 - 2/22)- s/p Ceftaz (2/17- 2/19)- s/p Cipro (2/19 - 2/23)?Hematology:Critical illness anemia- Sanguinous secretions, expected given tumor burden and coagulopathy, may worsen with radiation and baseline hematologic disease- Multivitamins and cofactors- Maintain Hgb >7?Hx of chronic ITP, Waldenstrom macroglobulinemia- Transfuse platelets <50 if bleeding, <10 if no active bleeding- Appreciate hematology recommendations?R IJ thrombus - High risk heparin gtt when plt consistently >50 and no more bloody secretions from tracheostomy- HSQ- Maintain SCDs at all times?Endocrine: Hx T2DM- Lantus 30 BID --> increase to 30u BID - HDISS- S/p Insulin gtt - HOLD home Metformin, Lantus 64u, Apidra 20u TID w meals- Appreciate diabetes team recsMusculoskeletal: Generalized deconditioning- OOB daily, PT/OT evals- Turn and reposition Q2 hours, PUP dressing?Device Review:- R IJ Port (08/12/20) per guidelines, nonocclusive DVT does not require removal of line- PEG (09/14/20)- Bivona trach (10/10/20)?Code Status / Dispo:- Transition to DNR (see ACP note 2/25)- Per ENT, patient to transfer to medical oncology after SICU- Appreciate palliative care assistance with GOC discussionsNotifications : For questions please call SICU Covering Provider in Epic EMR (primary) SICU [facility] Dynamic Roles (secondary)YSC 7-1 APP			[phone] 6-1 Frontside		[phone] 6-1 Backside		[phone] Verdi-2 APP			[phone]:Rosamund Nyland, PA-C SICU APP2/27/2022

## 2020-11-01 NOTE — Progress Notes
Due to the complex nature of this patient's critical illness I have seen and evaluated this patient and formulated the following plan in addition to the advanced practice provider's care. I have seen and evaluated [facility] and agree with the findings and plan as documented in the APP's note with the following changes. I have also reviewed all the pertinent labs and imaging.CV: hemodynamics: apparent septic shock, compounded by sedation needs, volume responsive, will give more fluidPressors: levophedArrhythmia: no                                             Antihypertensives: heldShock: septicPulmonary:Tolerated 4 hours of PS yesterday, goal 6 hours todayCopious secretions, suctioning q 0.5 hour, pt able to cough, foul smelling. Bivona again repositionedRespiratory toiletGI/Nutrition:Tube feeds:at goal via PEGStool softenersProtein-calorie malnutrition: high metabolic demand due to tumorRenal:AKI: yesOngoing AKI, supratherapeutic vancomycin levels- will switch to doxycyclineHigh insensible losses, will give 1LElectrolyte abnormalities: monitor hyper K and hyperphosEndocrine:Glycemic control 120-180 with insulin drip and lantus, increase to 30 bidSteroids off x 3 daysHeme:Anemia of chronic diseaseITP- platelets returning to baseline now that he is off steroidsDVT ppx: hep sqID:Abx: vanco for MRSA pneumonia, zosyn to be d/c'dWill switch to doxycyclineSepsis: yesNeuro:Sedation: pt very anxious when off propofol, has sensations of asphyxia, continue propofol and klonopin, ok to have ativan if needed. Improved neurologic statusPain control: fentanylDelirium: possiblyMSK/skin:Denuded skin and tumor around trach siteDispo: SICU, awaiting MICU- contacted again, they state no beds available.?DVT ppx: heparin sq                       GI ppx: PPIFamily present at bedside: did not visit yesterday as planned. Permission for 2 visitors over weekend, to be reconsidered tomorrow.Burna Cash, MDTrauma, Surgical Critical Care, and Acute Care Surgery2/27/2022This patient is critically ill as indicated by the following Acute Respiratory Failure, Septic Shock and Titration of antihyperglycemics, sedative dripsThe patient has required 35 minutes of my undivided attention during the past 24 hours for one of the previous mentioned life threatening diagnoses. The critical care interventions have included: Management of Acute Respiratory Failure with adjustments to ventilator settings/mode to maintain adequate ventilation / oxygenation , Management of septic shock with antibiotics, Vasopressors levophed to maintain adequate end organ perfusion  and Bolus volume resuscitation , Support of intensive care management with consulting services including Respiratory and Palliative Care and titration of medications including sedatives

## 2020-11-01 NOTE — Progress Notes
Otolaryngology Progress NoteDate: 11/01/2020 Interim History- NAEOVitalsTemp:  [37.4 ?C-38 ?C] 38 ?CPulse:  [76-115] 97Resp:  [0-44] 20BP: (92-139)/(46-68) 108/59SpO2:  [96 %-100 %] 97 %Device (Oxygen Therapy): mechanical ventilator I/O last 3 completed shifts:In: 5262.7 [I.V.:2236.5; NG/GT:2460; IV Piggyback:566.2]Out: 3300 [Urine:3300]Physical ExamGen: alert, vented PCEyes: open, normal external appearanceFace: grossly symmetricNose: clear anteriorlyOP/OC: hemostaticNeck: Bivona 8 cuffed secured w/soft trach ties, necrotic but hemostatic mass surrounding the stoma, remaining neck flat, firm tumor Pulm: no increased WOB on TMDiagnostic DataReviewed, per chartAssessment & Plan54 M with PMH of HTN, HLD, DM, tobacco and EtOH use, Waldenstrom macroglobulinemia, 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 Admitted to SICU for ventilatory support. No acute ENT intervention planned; no curative or meaningful surgical options at this time. Course complicated by multiple episodes of Bivona trach occlusion/obstruction.  - no ENT intervention planned at this time; case discussed at tumor board 2/14 and is not a surgical candidate - disease management/goals-of-care best pursued via non-surgical means; eg, Medical Oncology, Radiation Oncology, Palliative Care- transfer to Medical Oncology floor when eligible - continue routine trach care with careful suctioning- ensure trach well secured with Karn Cassis, MDPGY-1, Anesthesiology

## 2020-11-01 NOTE — Progress Notes
VANCOMYCIN LEVEL EVALUATIONCurrent Vancomycin Order:per laboratory/placeholder data. Day of Therapy: 4Vancomycin Indication: Suspected MRSA infectionEstimated Creatinine Clearance: 41 mL/min (A) (by C-G formula based on SCr of 2.34 mg/dL (H)).Creatinine (mg/dL) Date Value 07/02/2535 2.34 (H) 10/31/2020 2.39 (H) 10/30/2020 2.16 (H)  Renal Function: AKILevel: Lab Results Last 72 Hours Component Value Date/Time  Vancomycin Random 21.4 (H) 11/01/2020 04:34 AM  Vancomycin Random 14.1 10/31/2020 05:31 AM  Vancomycin Random 16.3 (H) 10/30/2020 08:52 PM Type of Level: Random (24 hours post-dose)Based on vancomycin level obtained, recommend:  Hold dose until level appropriateRepeat Level:  2/27 @ 2100ID/AST Consulted? No [facility] Vancomycin Dosing GuidelineBH Vancomycin Dosing GuidelineFor questions, please contact the pharmacist: Elvina Sidle, PharmD    Phone/Mobile Heartbeat: M[facility]

## 2020-11-02 LAB — POTASSIUM
BKR POTASSIUM: 5.7 mmol/L — ABNORMAL HIGH (ref 3.3–5.3)
BKR POTASSIUM: 5.9 mmol/L — ABNORMAL HIGH (ref 3.3–5.3)

## 2020-11-02 LAB — CBC WITH AUTO DIFFERENTIAL
BKR WAM ABSOLUTE IMMATURE GRANULOCYTES.: 0.53 x 1000/ÂµL — ABNORMAL HIGH (ref 0.00–0.30)
BKR WAM ABSOLUTE LYMPHOCYTE COUNT.: 0.17 x 1000/ÂµL — ABNORMAL LOW (ref 0.60–3.70)
BKR WAM ABSOLUTE NRBC (2 DEC): 0 x 1000/ÂµL (ref 0.00–1.00)
BKR WAM ANALYZER ANC: 7.19 x 1000/ÂµL (ref 2.00–7.60)
BKR WAM BASOPHIL ABSOLUTE COUNT.: 0.03 x 1000/ÂµL (ref 0.00–1.00)
BKR WAM BASOPHILS: 0.3 % (ref 0.0–1.4)
BKR WAM EOSINOPHIL ABSOLUTE COUNT.: 0.41 x 1000/ÂµL (ref 0.00–1.00)
BKR WAM EOSINOPHILS: 4.4 % (ref 0.0–5.0)
BKR WAM HEMATOCRIT (2 DEC): 26 % — ABNORMAL LOW (ref 38.50–50.00)
BKR WAM HEMOGLOBIN: 8.1 g/dL — ABNORMAL LOW (ref 13.2–17.1)
BKR WAM IMMATURE GRANULOCYTES: 5.7 % — ABNORMAL HIGH (ref 0.0–1.0)
BKR WAM LYMPHOCYTES: 1.8 % — ABNORMAL LOW (ref 17.0–50.0)
BKR WAM MCH (PG): 27.5 pg (ref 27.0–33.0)
BKR WAM MCHC: 31.2 g/dL (ref 31.0–36.0)
BKR WAM MCV: 88.1 fL (ref 80.0–100.0)
BKR WAM MONOCYTE ABSOLUTE COUNT.: 0.92 x 1000/ÂµL (ref 0.00–1.00)
BKR WAM MONOCYTES: 9.9 % (ref 4.0–12.0)
BKR WAM MPV: 12.6 fL — ABNORMAL HIGH (ref 8.0–12.0)
BKR WAM NEUTROPHILS: 77.9 % — ABNORMAL HIGH (ref 39.0–72.0)
BKR WAM NUCLEATED RED BLOOD CELLS: 0 % (ref 0.0–1.0)
BKR WAM PLATELETS: 62 x1000/ÂµL — ABNORMAL LOW (ref 150–420)
BKR WAM RDW-CV: 17.9 % — ABNORMAL HIGH (ref 11.0–15.0)
BKR WAM RED BLOOD CELL COUNT.: 2.95 M/ÂµL — ABNORMAL LOW (ref 4.00–6.00)
BKR WAM WHITE BLOOD CELL COUNT: 9.3 x1000/ÂµL (ref 4.0–11.0)

## 2020-11-02 LAB — PHOSPHORUS     (BH GH L LMW YH): BKR PHOSPHORUS: 4.5 mg/dL (ref 2.2–4.5)

## 2020-11-02 LAB — BASIC METABOLIC PANEL
BKR ANION GAP: 10 (ref 7–17)
BKR ANION GAP: 11 (ref 7–17)
BKR BLOOD UREA NITROGEN: 68 mg/dL — ABNORMAL HIGH (ref 6–20)
BKR BLOOD UREA NITROGEN: 66 mg/dL — ABNORMAL HIGH (ref 6–20)
BKR BUN / CREAT RATIO: 30.8 — ABNORMAL HIGH (ref 8.0–23.0)
BKR BUN / CREAT RATIO: 30.8 — ABNORMAL HIGH (ref 8.0–23.0)
BKR CALCIUM: 9.6 mg/dL (ref 8.8–10.2)
BKR CALCIUM: 9.4 mg/dL (ref 8.8–10.2)
BKR CHLORIDE: 109 mmol/L — ABNORMAL HIGH (ref 98–107)
BKR CHLORIDE: 108 mmol/L — ABNORMAL HIGH (ref 98–107)
BKR CO2: 19 mmol/L — ABNORMAL LOW (ref 20–30)
BKR CO2: 19 mmol/L — ABNORMAL LOW (ref 20–30)
BKR CREATININE: 2.21 mg/dL — ABNORMAL HIGH (ref 0.40–1.30)
BKR CREATININE: 2.14 mg/dL — ABNORMAL HIGH (ref 0.40–1.30)
BKR EGFR (AFR AMER): 38 mL/min/{1.73_m2} (ref >60)
BKR EGFR (AFR AMER): 39 mL/min/{1.73_m2} (ref >60)
BKR EGFR (NON AFRICAN AMERICAN): 31 mL/min/{1.73_m2} (ref >60)
BKR EGFR (NON AFRICAN AMERICAN): 32 mL/min/{1.73_m2} (ref >60)
BKR GLUCOSE: 217 mg/dL — ABNORMAL HIGH (ref 70–100)
BKR GLUCOSE: 267 mg/dL — ABNORMAL HIGH (ref 70–100)
BKR POTASSIUM: 5.7 mmol/L — ABNORMAL HIGH (ref 3.3–5.3)
BKR POTASSIUM: 5.9 mmol/L — ABNORMAL HIGH (ref 3.3–5.3)
BKR SODIUM: 138 mmol/L (ref 136–144)
BKR SODIUM: 138 mmol/L (ref 136–144)

## 2020-11-02 LAB — IMMATURE PLATELET FRACTION (BH GH LMW YH)
BKR WAM IPF, ABSOLUTE: 2.7 x1000/ÂµL (ref <20.0)
BKR WAM IPF: 4.4 % (ref 1.2–8.6)

## 2020-11-02 LAB — MAGNESIUM: BKR MAGNESIUM: 2.3 mg/dL (ref 1.7–2.4)

## 2020-11-02 MED ORDER — INSULIN GLARGINE 100 UNIT/ML SUBCUTANEOUS SOLUTION
100 unit/mL | Freq: Two times a day (BID) | SUBCUTANEOUS | Status: DC
Start: 2020-11-02 — End: 2020-11-02

## 2020-11-02 MED ORDER — DEXTROSE 10 % IV BOLUS FOR ORDERABLE
INTRAVENOUS | Status: DC | PRN
Start: 2020-11-02 — End: 2020-11-02

## 2020-11-02 MED ORDER — OXYCODONE IMMEDIATE RELEASE 15 MG TABLET
15 mg | GASTROSTOMY | Status: CP | PRN
Start: 2020-11-02 — End: ?
  Administered 2020-11-03 – 2020-11-08 (×22): 15 mg via GASTROSTOMY

## 2020-11-02 MED ORDER — DEXTROSE 10 % IV BOLUS FOR ORDERABLE
INTRAVENOUS | Status: DC | PRN
Start: 2020-11-02 — End: 2020-11-13

## 2020-11-02 MED ORDER — DEXTROSE 10 % IV BOLUS FOR ORDERABLE
INTRAVENOUS | Status: AC | PRN
Start: 2020-11-02 — End: ?

## 2020-11-02 MED ORDER — INSULIN U-100 REGULAR HUMAN 100 UNIT/ML (SLIDING SCALE)
100 unit/mL | Freq: Four times a day (QID) | SUBCUTANEOUS | Status: DC
Start: 2020-11-02 — End: 2020-11-02

## 2020-11-02 MED ORDER — INSULIN U-100 REGULAR HUMAN 100 UNIT/ML INJECTION SOLUTION
100 unit/mL | Freq: Once | INTRAVENOUS | Status: DC
Start: 2020-11-02 — End: 2020-11-02

## 2020-11-02 MED ORDER — INSULIN GLARGINE 100 UNIT/ML SUBCUTANEOUS SOLUTION
100 unit/mL | Freq: Every day | SUBCUTANEOUS | Status: DC
Start: 2020-11-02 — End: 2020-11-06

## 2020-11-02 MED ORDER — HYDROMORPHONE 2 MG/ML INJECTION SOLUTION
2 mg/mL | INTRAVENOUS | Status: DC | PRN
Start: 2020-11-02 — End: 2020-11-06
  Administered 2020-11-02 – 2020-11-06 (×13): 2 mL via INTRAVENOUS

## 2020-11-02 MED ORDER — CALCIUM GLUCONATE 1GM MINI-BAG PLUS
Freq: Once | INTRAVENOUS | Status: CP
Start: 2020-11-02 — End: ?
  Administered 2020-11-02: 22:00:00 100.000 mL/h via INTRAVENOUS

## 2020-11-02 MED ORDER — INSULIN U-100 REGULAR HUMAN 100 UNIT/ML INJECTION SOLUTION
100 unit/mL | Freq: Once | INTRAVENOUS | Status: CP
Start: 2020-11-02 — End: ?
  Administered 2020-11-02: 22:00:00 100 mL via INTRAVENOUS

## 2020-11-02 MED ORDER — DEXTROSE 10 % IV BOLUS FOR ORDERABLE
Freq: Once | INTRAVENOUS | Status: CP
Start: 2020-11-02 — End: ?
  Administered 2020-11-02: 22:00:00 via INTRAVENOUS

## 2020-11-02 MED ORDER — INSULIN U-100 REGULAR HUMAN 100 UNIT/ML (SLIDING SCALE)
100 unit/mL | Freq: Four times a day (QID) | SUBCUTANEOUS | Status: DC
Start: 2020-11-02 — End: 2020-11-03

## 2020-11-02 MED ORDER — SODIUM ZIRCONIUM CYCLOSILICATE 10 GRAM ORAL POWDER PACKET
10 gram | Freq: Once | ORAL | Status: CP
Start: 2020-11-02 — End: ?
  Administered 2020-11-02: 22:00:00 10 gram via ORAL

## 2020-11-02 MED ORDER — DEXTROSE 10 % IV BOLUS FOR ORDERABLE
Freq: Once | INTRAVENOUS | Status: DC
Start: 2020-11-02 — End: 2020-11-02

## 2020-11-02 MED ORDER — INSULIN U-100 REGULAR HUMAN 100 UNIT/ML INJECTION SOLUTION
100 unit/mL | Freq: Once | INTRAVENOUS | Status: CP
Start: 2020-11-02 — End: ?
  Administered 2020-11-02: 11:00:00 100 mL via INTRAVENOUS

## 2020-11-02 MED ORDER — SODIUM CHLORIDE 0.9 % BOLUS (NEW BAG)
0.9 % | Freq: Once | INTRAVENOUS | Status: CP
Start: 2020-11-02 — End: ?
  Administered 2020-11-02: 18:00:00 0.9 mL/h via INTRAVENOUS

## 2020-11-02 MED ORDER — INSULIN BOLUS FROM BAG
Freq: Once | INTRAVENOUS | Status: CP
Start: 2020-11-02 — End: ?
  Administered 2020-11-03: 01:00:00 1.000 mL via INTRAVENOUS

## 2020-11-02 MED ORDER — CALCIUM GLUCONATE 1GM MINI-BAG PLUS
Freq: Once | INTRAVENOUS | Status: DC
Start: 2020-11-02 — End: 2020-11-03

## 2020-11-02 MED ORDER — INSULIN U-100 REGULAR HUMAN 100 UNIT/ML (SLIDING SCALE)
100 unit/mL | Freq: Four times a day (QID) | SUBCUTANEOUS | Status: DC
Start: 2020-11-02 — End: 2020-11-02
  Administered 2020-11-02: 20:00:00 100 mL via SUBCUTANEOUS

## 2020-11-02 MED ORDER — CALCIUM GLUCONATE INJECTION
Freq: Once | INTRAVENOUS | Status: CP
Start: 2020-11-02 — End: ?
  Administered 2020-11-02: 11:00:00 20.000 mL via INTRAVENOUS

## 2020-11-02 MED ORDER — OXYCODONE IMMEDIATE RELEASE 5 MG TABLET
5 mg | GASTROSTOMY | Status: CP | PRN
Start: 2020-11-02 — End: ?
  Administered 2020-11-02 – 2020-11-04 (×3): 5 mg via GASTROSTOMY

## 2020-11-02 MED ORDER — SODIUM ZIRCONIUM CYCLOSILICATE 10 GRAM ORAL POWDER PACKET
10 gram | Freq: Three times a day (TID) | ORAL | Status: DC
Start: 2020-11-02 — End: 2020-11-04

## 2020-11-02 MED ORDER — DEXTROSE 10 % IV BOLUS FOR ORDERABLE
Freq: Once | INTRAVENOUS | Status: CP
Start: 2020-11-02 — End: ?
  Administered 2020-11-02: 18:00:00 via INTRAVENOUS

## 2020-11-02 MED ORDER — INSULIN REGULAR 100 UNIT/100 ML (1 UNIT/ML) IN 0.9 % NACL IV SOLUTION
1001001 unit/ mL (1 unit/mL) | INTRAVENOUS | Status: DC
Start: 2020-11-02 — End: 2020-11-13
  Administered 2020-11-03 – 2020-11-12 (×8): 100 mL/h via INTRAVENOUS

## 2020-11-02 MED ORDER — INSULIN U-100 REGULAR HUMAN 100 UNIT/ML INJECTION SOLUTION
100 unit/mL | Freq: Once | INTRAVENOUS | Status: CP
Start: 2020-11-02 — End: ?
  Administered 2020-11-02: 18:00:00 100 mL via INTRAVENOUS

## 2020-11-02 MED ORDER — HYDROMORPHONE 2 MG/ML INJECTION SOLUTION
2 mg/mL | INTRAVENOUS | Status: DC | PRN
Start: 2020-11-02 — End: 2020-11-02
  Administered 2020-11-02: 19:00:00 2 mL via INTRAVENOUS

## 2020-11-02 MED ORDER — INSULIN U-100 REGULAR HUMAN 100 UNIT/ML INJECTION SOLUTION
100 unit/mL | Freq: Four times a day (QID) | SUBCUTANEOUS | Status: DC
Start: 2020-11-02 — End: 2020-11-06

## 2020-11-02 MED ORDER — DEXTROSE 10 % IV BOLUS FOR ORDERABLE
Freq: Once | INTRAVENOUS | Status: CP
Start: 2020-11-02 — End: ?
  Administered 2020-11-02: 11:00:00 via INTRAVENOUS

## 2020-11-02 NOTE — Plan of Care
Plan of Care Overview/ Patient Status    Pt remains sedated on Propofol, RASS -2 to -1,  anxious and tearful at times, reassurance provided. PERRL. MAE spontanoesly,  doesn't respond on command. Continued mild generalized edema, extremities elevated on pillows. PRN Tylenol and Oxycodone given for pain per CPOT. NSR on Tele, HR 70s-90s. Levo titrated to maintain MAP goal >65,ongoing @ 0.06. T-Max: 100.2, PRN Tylenol given and cool compresses applied. Trached on PAC settings, 35% FiO2, lungs diminished, in-line suctioned ~ Q1H for large creamy secretions, oral suctioned PRN for moderate white secretions. Tolerated TF @ goal via PEG, minimal residuals. Abdomen soft, rounded, +BS, +flatus, +BM (large). Foley indwelling with adequate clear-yellow UO (100-175 ml/hr). Secura cream applied to groin and perianal excoriation. T&P Q2H. Potassium 5.7, insulin/dextrose/calcium given, repeat K+ draw pending. Spoke with pt sister [name]). Safety maintained. See MAR and flowsheets for further info.

## 2020-11-02 NOTE — Progress Notes
[facility]	SICU Progress NoteAttending Provider: Alric Quan, MDPost-Operative Day/Post Injury Day Procedure(s):2/5: [name] exchange (ENT) Hospital LOS: 24 days Interim History:  HPI: 55 y.o. male admitted to the SICU on 2/9 for ventilator management following an RRT on the oncology floor for desaturation and increased work of breathing while on trach mask and ultimately placed back on vent. ?Patient was initially a transfer from [facility] on 2/4 after trach and PEG when he had a partial obstruction of trach site from a new necrotic mass. He underwent trach exchange 2/5. Was on trach mask on oncology floor however had 2 episodes on floor requiring RRT for lavage and likely mucous plugging. ?Course Complicated by: - Incidental R IJ thrombus - Multiple episodes of presumed mucous plugging requiring RRT's ?PMH: Chronic ITP, Waldenstrom macroglobulinemia, HTN. HLD, DM, tobacco and alcohol use, Laryngeal SCC (weekly Cisplatin and RT)?PSH: None known ?Home meds: Lipitor, Pepcid, Lisinopril, Metformin, Oxycodone, miralax, Insulin U100 and Lantus SICU course:2/9: Arrives to SICU HDS, on vent PCV. Appears well. Transitioned to Pressure support on vent and tolerating well. O/N: Episode of patient feeling like not getting a full breath, extremely tachypenic and increased work of breathing and tachycardia, attempted suctioning and lavage without improvement, attempted to switch to PCV and VAC but due to dyssynchrony unable to tolerate, no desat but poor ventilation and TV and ETCO2 up to 100. ENT at bedside do not feel additional scope would benefit as he was just scoped a few hours ago, ultimately gave fentanyl push and started propofol for vent synchrony but despite max prop still dyssynchronous. Gave one dose of roccuronium once sedated for vent synchrony with good effect. Albuterol and HTS neb given and peaks improved from 50 to 30's. No further paralytic needed and now synchronous on VAC. Kept on propofol for vent synchrony. CXR reassuring and back to GCS 11T. Hypotension only following sedation and Rocc, Low dose Neo started. 2/10: Kept on higher sedation in AM for [place] A/P (for staging) and soft tissue neck. Weaning sedation and will plan for PSV trial once more awake. Scoped by ENT in AM, airway patent. Q1-2hr suction requirements of thick tan secretions. No further episodes of respiratory distress although sedation remains high. Tube feeds changed to continuous given significant hyperglycemia. Insulin regimen changed per diabetes team recommendations. Pressure ulceration noted around tracheostomy --> ENT at bedside to evaluation, likely some component of invasive tumor growth. Palliative care consult for assist with symptom management and family moving forward. Primary oncologist office made aware of referral. O/N: weaning down on Prop, no episodes of respiratory distress or desats overnight. Febrile to 101.3, sputum and blood cultures ordered. 500cc LR bolus given for some oliguria and rising Cr. 2/11: Given 500cc NS for oliguria with good response. Vanco level yesterday 25, likely cause of worsening aki, on hold. UOP & lytes remains stable. Insulin adjusted per diabetes team. PSV as tolerated (doing well). CPT added. Confirmed with ENT pulm recs for blue portex suctionaid trach- no plans to switch at this time. Bowel regimen added. Plan keep on neo if low dose but if inc, consider changing to levo as likely then not in setting of sedation. Family updated on phone. Confirmed w heme- no AC rec for R IJ thrombus until plt > 50. Was going to attempt to place pt on TM overnight as he did very well on PSV x 3hrs, however, pt had anxiety attack regarding his cancer and prognosis, he requested to sleep during night and was agreeable to TM in  am. O/N: Patient agrees to PSV overnight which he did very well on. Agreeable to getting OOB to chair and possibly trach mask in AM but wants to stay on vent tonight. Off Neo by AM.  2/12: trial trach mask as tolerated this AM. Adjusted insulin regimen per the diabetes management service. Sputum now with staph, awaiting sensitivities. More loose stools today; nursing placed rectal tube and held bowel regimen.  ON: Anxious at times, feeling like he cannot breath but doing well on TM. Frequent suctioning. Increased standing. FIO2 increased to 40%.  insulin to 14U per endo reccs. 2/13: continue to adjust insulin regimen, now 20u regular q6. Cr continues to uptrend 2.57 --> 2.63 and repeat vanco level still elevated 24.6; pharmacy to continue dosing adjustments. Otherwise, remains on trach mask. Sputum with MRSA --> d/c ceftriaxone. ON: increased standing insulin to 24U. 2/14: Will discuss [facility] plan with hematology now that PLT > 50 x2 checks. Will also d/c vancomycin today (10d of therapy thus far). AKI appears to have peaked at 2.57 -> 2.63 -> 2.47. Awaiting transfer to the oncology service on NP 15. Okay to start [facility], but will hold off today given more bloody secretions. 2/15: Plan to start palliative RT today. AM session aborted due to extreme anxiety (despite premedication with IV ativan), tried again in the PM with 10mg  IV Valium + 3mg  IV ativan, procedure again aborted due to extreme anxiety and inability to tolerate. Pending discussion with rad onc in AM re: utility of ongoing attempts at RT. Pending transfer to NP15 on med onc service.  O/N: increasingly anxious / restless, but able to re-direct and calm down with PRN ativan. Liquid stools, placed rectal tube. Will start banatrol and check CBC in AM, check c dif. This had previously improved by holding bowel regimen.2/16: Acutely increased WOB, HR 130s, SBP 170s --> ENT bedside scope with trach patent, though notable secretions --> suctioned with minimal improvement; placed back on ventilator PSV 10 / +5 with immediate improvement in VS and respiratory distress. Precedex gtt started to assist more frequent suctioning. CXR with LLL consolidation --> resend sputum, and restart Vanco (close trough monitoring with AKI) given known MRSA PNA. Increased diarrhea, cdiff negative, transition to Vital TF. LR 500cc for hemoconcentration of labs, tachycardia. Appreciate radiation oncology; sedated patient with Propofol gtt, bolus x1 with good effect --> able to tolerate 15 minute radiation session; plan to repeat Thursday, Friday, Monday, and Tuesday. 2/17: Psych consulted for depression--> Doxepin added, Klonopin added. Hypernatremia--> D5W and free water flushes added. Ceftaz added for GNR in sputum. Plan for family discussion this PM (see separate note) and radiation this PM. Foley replaced for retention. ON: Tolerated PSV 15/5. Repeat Na 148.2/18: Plan for radiation this afternoon. Rectal tube removed.. Hgb 6.3--> transfused 1un PRBCs. Discussed care with sister and niece at bedside, remain full code and plan to continue radiation course through Tuesday (see family communication note).2/19: PSV weaned 15 --> 12/+5. Start Clonidine and discontinue Precedex gtt. Discontinue Gabapentin and Dilaudid IV, decrease Oxycodone to 2.5 / 5. Na improved 144, discontinue D5W; sugars should improve, maintain current regimen per DM team. GNR per micro lab not pseudomonas, klebsiella, or other enteric organism; would have speciated --> change Ceftaz to PO Cipro, to complete course 2/23 for one week. Cr again increased with Vanco level over 20 --> next dose held, monitor level closely. Hgb remains low despite 1U yesterday, 6.3 --> 7.0; start multivitamins and check with Heme. ON: Low grade fevers stable. Given ativan  0.5mg  in place of klonipin with good anxiolytic effect. Transfuse 1u PRBC (likely half unit) for Hgb 6.7.2/20: Acute respiratory distress this morning after turn, minimal secretions, did not improve with increased pressure or VAC --> bagged x5 mins with improvement, placed on PCV, IP 20 --> CXR pending, start Propofol, add back Ativan and Morphine PRN. Appreciate heme assistance with persistent anemia. Cr continues to rise following elevated vanco trough; hold any further doses, level will likely remain therapeutic to final date of course (2/22) --> unfortunately, patient did receive additional dose of Vanco 2g per pharmacy order this morning; discontinued placeholder, consider Doxycycline if Vanco level subtherapeutic prior to end date.ON: Remains on PCV. Remained on low dose levo 0.02 throughout night. Manual suctioning by RT overnight with thin tan secretions. AM labs notable for Hgb 9.1(6.7) after 1uPRBC transfusion yesterday. Worsening AKI Cr 2.51 (2.44).2/21: Patient alert and breathing comfortably this morning --> wean PEEP back to 5, consider retrial of PSV today pending radiation plans. Free water flushes discontinued, hypernatremia resolved. Worsening AKI from Vanco; since discontinued, monitor UOP. LR 250cc, wean off Levo gtt. Appreciate palliative care assistance after event yesterday, patient requesting further discussion. Lantus changed to 15U QHS, 10U QAM and bolus increased to 28U Q6.ON: Acute respiratory distress at change of shift, lost tidal volumes and desaturated to high 80s requiring bagging, recovered. Pt expressing that he cannot breathe, feels obstructed --> suctioned with 10Fr suction w/ resistance met at 20cm and minimal secretions. ENT at bedside for scope --> thick secretions noted but airway patent.. Noted that Bivona trach was retracted, advanced to proper position by ENT. Per ENT, tracheal tissue proximal to bivona tip very flimsy. Likely tissue collapsing against distal tip with trach retracted in poor position causing obstruction. Patient acutely distressed and agitated during event, propofol gtt restarted will plan to wean off tonight. Start decadron 8mg  q12 per onc recs for peritumoral inflammation. Hyperkalemic in AM to 5.7 --> EKG unchanged, insulin/dextrose/Ca2+ x 1. Transfuse 1u PRBC for hgb 6.9. 2/22: Keep on PCV today and continue propofol gtt. Start Insulin gtt and increase Lantus to 20u BID. No bowel movements --> discontinue banatrol. Plan for radiation today. Discussed transfer to MICU no beds available, will initiate transfer once a bed becomes available. Plan for multidisciplinary family meeting this week. ON: Rested on PCV, low dose prop. WBC count 12 (7) this am2/23: Plan for last palliative radiation session later today. RR decreased to 12. Given rising white count and increased in line suctioning requirement Sputum culture sent and CXR obtained. Will stop Cipro and start ceftriaxone. 1L LR bolus given for AKI, rising BUN as well as likely concentration on CBC. Plan to start weaning off prop after radiation and attempt PSV trials again. Family discussion with all teams Thursday or Friday. CPT added back given increased secretions attempted to add back HTS nebs however hospital is completely out per pharmacy discussion. Respiratory culture again growing GPC and GNR. ON: BG stable on ins gtt. NAE2/24: This AM, less responsive (GCS 10T) and BP down to 60/40s, but also worsening vent synchrony --> increased oxycodone and dilaudid followed by 1000 LR bolus with good effect and pan-cx given worsening leukocytosis. Started vanc / zosyn.  Later, self d/c bivona trach and desat to 60s; SICU attending at bedside and replaced bivona trach without difficulty and placed back on the ventilator.  ENT at bedside to scope and confirmed trach positioning. Will plan for family meeting tomorrow. ON: NAE2/25: GCS 11T, but still withdrawn / lethargic. Will  d/c PRN ativan to minimize sedation and continue to wean propofol as tolerated, though patient has been mentating on higher doses than this. Still low dose levo requiremements. Bivona trach dislodged ~4cm today, obturator placed and re-advanced without issue. Trial PSV 15/5, tolerating with Tv 400s. D/w ENT, will try to work on custom trach for him. Plan for family meeting this afternoon for goals of care discussions. Rested on [name] settings after ~6 hours of 15/5 (work of breathing + tachypnea 40s). Decreased lantus to 15 BID per endocrine. In afternoon, became more agitated / tachy requiring additional PRN 1mg  ativan + 1mg  versed + dilaudid. See ACP note for family meeting discussion. O/N: NAE. 2/26: Attempt to wean propofol to 35, increased agitation and tachypnea --> increase Prop back to 40 and oxycodone given with improvement. Plan for PSV 15/5 x 4 hours today as tolerated. Increased Lantus to 20u BID. Discontinued Zosyn. Discussed with pharmacy switching Vanco to Clinda given worsening renal function --> recommend continuing Vanco as high risk of C.diff with Clinda, will recheck random vanco level tomorrow @ 0500. 1L NS given for rise in creatinine. O/N: Vanc trough 21.4. 2/27: Plan for PSV 15/5 x 6 hours as tolerated. 1L LR given for high insensible losses. Change Vanco to PO Doxy. Increase Lantus to 30u BID, d/c insulin gtt and start HDISS.  O/N: K 5.7 --> insulin/dextrose/Ca given. 2/28: Remains on Levo. K 5.7--> 5.9--> Insulin/ Dextrose/ Ca x given x2 and Lokelma x1. Hyperglycemic--> Lantus 30 daily, HDISS, Regular Insulin 9un Q6hours. Decreased IP to 18, tolerating with TV 400s-500s. Heme consulted for ongoing thrombocytopenia, low concern for HIT, no plan to treat ITP unless plt<30k or bleeding.  Review of Allergies/Meds/Hx: I have reviewed the patient's: allergies, past medical history, past surgical history, family history, social history, prior to admission medicationsObjective: Vitals:I have reviewed the patient's current vital signs as documented in the EMRTemp:  [99.5 ?F (37.5 ?C)-100.4 ?F (38 ?C)] 99.68 ?F (37.6 ?C)Pulse:  [72-98] 79Resp:  [12-30] 14BP: (100-139)/(46-82) 105/52SpO2:  [95 %-100 %] 98 %SpO2: 98 %Device (Oxygen Therapy): mechanical ventilatorI/O's:I have reviewed the patient's current I&O's as documented in the EMR.Gross Totals (Last 24 hours) at 11/02/2020 0012Last data filed at 11/02/2020 0000Intake 4546.83 ml Output 3520 ml Net 1026.83 ml Physical ExamConstitutional:     General: Anxious and withdrawn gentleman appearing older than stated age. HENT:    Head: Normocephalic and atraumatic.    Mouth: Mucous membranes are moist.    Extraocular Movements: Extraocular movements intact.    Pupils: Pupils are equal, round, and reactive to light.    Comments: Tracheostomy in placeCardiovascular:    Rate and Rhythm: Regular rhythm. Regular rate   Heart sounds: No murmur heard. Pulmonary:    Breath sounds: No wheezing, rhonchi or rales.    Comments: Ventilated via trachAbdominal:    General: There is no distension.    Tenderness: There is no abdominal tenderness.    Comments: PEG tube in place with TFs runningGenitourinary:   Comments: Foley in place draining clear, yellow urineMusculoskeletal:    Lower legs: Mild edema   Comments: Moves all extremities equally, to command.Skin:   General: Skin is warm and dry.    Capillary Refill: Capillary refill takes less than 2 seconds. Neurological:    General: No focal deficit present.    Mental Status: He is alert.    Cranial Nerves: No cranial nerve deficit.    Motor: No weakness. Labs: Refer to EMR. I have reviewed the patient's labs within the last  24 hours; see assessment and plan below for significant abnormals.Microbiology:Refer to EMR. I have reviewed all new results within the last 24 hours; see assessment and plan below.Diagnostics:Refer to EMR. I have reviewed all new results within the last 24 hours; see assessment and plan below.Chest IR:SWNIO to EMR. I have reviewed all new results within the last 24 hours; see assessment and plan below.Abdominal EV:OJJKK to EMR. I have reviewed all new results within the last 24 hours; see assessment and plan below.ECG/Tele Events: I have reviewed the patient's ECG and telemetry as resulted in the EMR.Assessment/Plan Neurologic: Analgesia/sedation - Tylenol 650 Q6 PRN - Oxycodone 10 / 15 Q4 PRN - Dilaudid 1 PRN breakthrough- Propofol gtt, wean as able  Anxiety- s/p clonidine taper (stopped early 2/2 hypotension)- Clonazepam 0.5mg  BID, consider d/c if mental status continues to be depressed- Doxepin 10mg  QHS- Ativan 0.5mg  IV Q6 PRN acute agitation- Appreciate palliative care and psych consults?ENT: L laryngeal SCC, invading tracheostomy: s/p trach exchange 10/10/20 and biopsy 10/12/20- Bivona trach to bypass obstruction 	- F/u with ENT re: custom Bivona trach- Unfortunately, there are no surgical / curative options- Palliative radiation (2/16, 2/17, 2/18, 2/21, 2/22, 2/23)	- Required PCV with full propofol sedation to tolerate?Cardiac: Septic shock- Norepinephrine gtt(2/24- present) to maintain MAP>65Hx of HTN / HLD- Hold home lisinopril, lasix, crestor given AKI?Respiratory:  Respiratory failure (circumferential tumor invasion of stoma, mucous plugging / trach occlusion)- PC/AC: IP 18, PEEP +5- s/p Decadron 8mg  BID for tumor inflammation (2/22 -2/24) per med oncology- Albuterol Q6- CPT 4 times daily - Could consider mucomyst nebsGI: Diarrhea, resolved - PEG in place with TF: Vital @ 95cc/hr; while on propofol --> peptamen at 85cc/hr?Renal: AKI on CKD (baseline Cr 1.5-1.6), 2/2 supratherapeutic vancomycin level, improving - Cr continues to rise, continue to trend daily- Foley replaced 2/17 for urinary retentionHyperkalemia:- medical management with Insulin/ Dextrose/ Calcium- trend lytes- Lokelma if neededInfectious Disease:  Concern for new pneumonia- 2/24 sputum: GNR- 2/23 sputum: MRSA- Vanco  (2/24- 2/27), de-escalate to Doxy (2/27 - 3/5) plan 10d course - S/p Zosyn (2/24- 2/26)Hx MRSA PNA; 4+ GNR PNA- s/p Vanco (2/5 - 2/14, 2/16 - 2/22)- s/p Ceftaz (2/17- 2/19)- s/p Cipro (2/19 - 2/23)?Hematology:Critical illness anemia- Sanguinous secretions, expected given tumor burden and coagulopathy, may worsen with radiation and baseline hematologic disease- Multivitamins and cofactors- Maintain Hgb >7?Hx of chronic ITP, Waldenstrom macroglobulinemia- Transfuse platelets if <30k, bleeding, or need for invasive procedure per heme- no need to treat ITP unless plts<30k- would hold Hep SC if plts<50k- Appreciate hematology recommendations?R IJ thrombus - High risk heparin gtt when plt consistently >50 and no more bloody secretions from tracheostomy- HSQ- Maintain SCDs at all times?Endocrine: Hx T2DM- Lantus 30 daily- Regular insulin 9un SC Q6hours- HDISS- S/p Insulin gtt - HOLD home Metformin, Lantus 64u, Apidra 20u TID w meals- Appreciate diabetes team recsMusculoskeletal: Generalized deconditioning- OOB daily, PT/OT evals- Turn and reposition Q2 hours, PUP dressing?Device Review:- R IJ Port (08/12/20) per guidelines, nonocclusive DVT does not require removal of line- PEG (09/14/20)- Bivona trach (10/10/20)?Code Status / Dispo:- Transition to DNR (see ACP note 2/25)- Per ENT, patient to transfer to medical oncology after SICU- Appreciate palliative care assistance with GOC discussionsNotifications : For questions please call SICU Covering Provider in Epic EMR (primary) SICU [facility] Dynamic Roles (secondary)YSC 7-1 APP			[phone] 6-1 Frontside		[phone] 6-1 Backside		[phone] Verdi-2 APP			[phone]:Indy Kuck, APRNSICU APP2/28/2022

## 2020-11-02 NOTE — Progress Notes
Otolaryngology Progress NoteDate: 11/02/2020 Interim History- NAEOVitalsTemp:  [99.5 ?F (37.5 ?C)-100.4 ?F (38 ?C)] 99.68 ?F (37.6 ?C)Pulse:  [72-98] 82Resp:  [10-30] 12BP: (100-130)/(46-82) 102/52SpO2:  [95 %-100 %] 97 %Device (Oxygen Therapy): mechanical ventilator I/O last 3 completed shifts:In: 4719.1 [I.V.:2159.1; NG/GT:2310; IV Piggyback:250]Out: 3640 [Urine:3640]Physical ExamGen: alert, vented PCEyes: open, normal external appearanceFace: grossly symmetricNose: clear anteriorlyOP/OC: hemostaticNeck: Bivona 8 cuffed secured w/soft trach ties, necrotic but hemostatic mass surrounding the stoma, remaining neck flat, firm tumor Pulm: no increased WOB on TMDiagnostic DataReviewed, per chartAssessment & Plan54 M with PMH of HTN, HLD, DM, tobacco and EtOH use, Waldenstrom macroglobulinemia, 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 Admitted to SICU for ventilatory support. No acute ENT intervention planned; no curative or meaningful surgical options at this time. Course complicated by multiple episodes of Bivona trach occlusion/obstruction.  - no ENT intervention planned at this time; case discussed at tumor board 2/14 and is not a surgical candidate - disease management/goals-of-care best pursued via non-surgical means; eg, Medical Oncology, Radiation Oncology, Palliative Care- transfer to Medical Oncology floor when eligible - continue routine trach care with careful suctioning- ensure trach well secured with tiesElectronically Signed ZO:XWRUE Milka Windholz DDSContact: MHB2/28/20227:48 AM

## 2020-11-02 NOTE — Progress Notes
Surgical Intensive Care Unit  AttendingI have personally performed a face to face diagnostic evaluation on this patient, reviewed the chart, available data and care plan and have personally formulated the plan outlined below. I have seen this patient in addition to the Surgical ICU advanced practitioner given patient's complexity and associated need for critical care. 55 y.o. male admitted to the surgical intensive care unit on 10/14/2020 for on 10/09/2020 for respiratory failure.  Briefly, Arthur [name] is a 55 y.o. 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  The patient's hospital course was significant for a knee tracheostomy exchange 10/10/20, complicated by episodes of hypoxia requiring suctioning of mucus plugs and transferred to the surgical intensive care unit on 10/14/2020.  The patient received palliative radiation treatments, completed on 10/28/20.  His hospital course has been notable for altered mental status and decreased responsiveness, continued vasopressor requirement, pressure support trials, ongoing antibiotic treatment for possible aspiration pneumonia.Hospital Day 24 Past Medical History: Diagnosis Date ? Diabetes mellitus (HC Code) (HC CODE)  ? Hypercholesteremia  ? Hypertension  No past surgical history on file.Scheduled Meds:Current Facility-Administered Medications Medication Dose Route Frequency Provider Last Rate Last Admin ? albuterol neb sol 2.5 mg/3 mL (0.083%) (PROVENTIL,VENTOLIN)  2.5 mg Nebulization Q6H [place], [place], [place]   2.5 mg at 11/01/20 2000 ? ascorbic acid (vitamin C) (VITAMIN C) tablet 250 mg  250 mg Per G Tube Daily [place], [place]   250 mg at 11/01/20 0981 ? chlorhexidine gluconate (PERIDEX) 0.12 % solution 15 mL  15 mL Mouth/Throat Q12H [place] Reeves, [place]   15 mL at 11/01/20 2008 ? clonazePAM (KlonoPIN) tablet 0.5 mg  0.5 mg Per J Tube BID Mat Carne, APRN   0.5 mg at 11/01/20 2008 ? doxepin (SINEquan) solution 10 mg  10 mg Per J Tube Nightly Mat Carne, APRN   10 mg at 11/01/20 2008 ? doxycycline TAB/CAP 100 mg  100 mg Per G Tube Q12H Bettey Costa, PA   100 mg at 11/01/20 2213 ? folic acid (FOLVITE) tablet 1 mg  1 mg Per G Tube Daily [place], [place], [place]   1 mg at 11/01/20 1914 ? heparin (porcine) injection 7,500 Units  7,500 Units Subcutaneous Q8H [place], [place], [place]   7,500 Units at 11/02/20 7829 ? insulin glargine (Semglee,Lantus) injection 30 Units  30 Units Subcutaneous Q12H Bettey [place], [place]   30 Units at 11/01/20 2008 ? [Held by provider] insulin regular human (HumuLIN R, NovoLIN R) 100 unit/mL injection 28 Units  28 Units Subcutaneous Q6H Jennye [place], [place]   28 Units at 10/27/20 5621 ? insulin regular human (HumuLIN R, NovoLIN R) Sliding Scale (See admin instructions for dose)   Subcutaneous Q6H Bettey Costa, [place]   3 Units at 11/02/20 3086 ? miconazole nitrate (SECURA EXTRA THICK ANTIFUNGAL) 2 % cream   Topical (Top) BID Lisette Abu, [place]   Given at 11/01/20 2012 ? midazolam (PF) (VERSED) 1 mg/mL injection          ? rocuronium (ZEMURON) 10 mg/mL injection          ? thiamine (VITAMIN B1) tablet 50 mg  50 mg Per G Tube Daily [place], Joni Reining, PA   50 mg at 11/01/20 5784 Continuous  Infusions:? norepinephrine 0.06 mcg/kg/min (11/02/20 0700) ? propofol (DIPRIVAN) 1,000 mg in 100 mL (10 mg/mL) 40 mcg/kg/min (11/02/20 0700) PRN Meds: acetaminophen, dextrose (GLUCOSE) 40 % gel 15 g **OR** fruit juice **OR** skim milk, dextrose (GLUCOSE) 40 % gel 30 g **OR** fruit juice, dextrose injection, dextrose injection, [COMPLETED] insulin regular human **AND** [COMPLETED] dextrose injection **AND** dextrose injection **AND** POCT Glucose, glucagon, HYDROmorphone, LORazepam, oxyCODONE **OR** oxyCODONE, zinc oxideVITALS:Temp (24hrs), Avg:100 ?F (37.8 ?C), Min:99.5 ?F (37.5 ?C), Max:100.4 ?F (38 ?C)Vitals:  11/02/20 0418 11/02/20 0500 11/02/20 0600 11/02/20 0700 BP:  (!) 103/51 (!) 119/55 (!) 102/52 Pulse: 88 79 86 82 Resp: (!) 29 (!) 12 20 (!) 12 Temp: 99.86 ?F (37.7 ?C) 99.86 ?F (37.7 ?C) 99.68 ?F (37.6 ?C) 99.68 ?F (37.6 ?C) TempSrc:  Bladder Bladder Bladder SpO2: 97% 99% 99% 97% Weight:     Height:     Intake/Output Summary (Last 24 hours) at 11/02/2020 0747Last data filed at 11/02/2020 0700Gross per 24 hour Intake 4719.12 ml Output 3640 ml Net 1079.12 ml Exam unchanged as of 11/02/2020 with exceptions noted belowPhysical ExamLabs Last 24 hours:Most Recent Result Component Value Date/Time  Sodium 138 11/02/2020 04:52 AM  Potassium 5.7 (H) 11/02/2020 04:52 AM  Chloride 109 (H) 11/02/2020 04:52 AM  CO2 19 (L) 11/02/2020 04:52 AM  BUN 68 (H) 11/02/2020 04:52 AM  Creatinine 2.21 (H) 11/02/2020 04:52 AM  WBC 9.3 11/02/2020 04:52 AM  Hemoglobin 8.1 (L) 11/02/2020 04:52 AM  Hematocrit 26.00 (L) 11/02/2020 04:52 AM  Platelets 62 (L) 11/02/2020 04:52 AM Imaging Last 24 hours:No results found.Overnight Events:-- Tolerated pressure support trial for 6 hours-- Ongoing crystalloid resuscitation for relative hypotension-- Vancomycin discontinued, started on doxycycline-- Ongoing hyperglycemia with changes need to insulin regimen-- Hyperkalemia requiring treatmentInfectious Disease/Sepsis: MRSA pneumonia-- Continue to monitor WBC count and fever curve-- Continue doxycycline (end date 11/07/20)-- Prior antibiotics:  Vancomycin (2/5 - 2/14, 2/16 - 2/22); ceftazidime (2/17 - 2/19); ciprofloxacin (2/19 - 2/23)HEENT:Squamous cell laryngeal cancer invading tracheostomy, status post tracheostomy exchange 10/10/20 and biopsy on 10/12/20-- Continue Bivona tracheostomy to bypass obstruction, maintain additional tracheostomy at the bedside-- Completed course of palliative radiation as per oncology recommendations (2/16 - 2/23)-- Continue to follow-up Oncology recommendations, we appreciate the consult-- Continue to follow-up with a primary team (ENT) recommendations; no surgical management at this time as per their recommendationsCardiac: Septic shock-- Continue to monitor hemodynamic status-- Continue norepinephrine with goal MAP > 65 mmHg, wean off as toleratedHistory of hypertension, hyperlipidemia-- Continue to hold home lisinopril, Lasix, Crestor until hemodynamically stable and tolerating PORespiratory: Respiratory failure with circumferential squamous cell laryngeal cancer invading stoma, with intermittent mucus plugging/tracheal occlusion-- Wean off mechanical ventilation as tolerated-- Encourage IS and pulmonary toilet when able-- Continue albuterol nebs q6 hours while awake-- Continue chest physiotherapy q6 hrs hours while awakeRenal, Fluids/Electrolytes: AKI on CKD (baseline creatinine 1.5 to 1.6), with recent supratherapeutic vancomycin level, now improving-- Maintain Foley catheter for the strict monitoring of I/O in this critically ill patient; placed for urinary retention on 10/22/20, will perform voiding trial when more stable-- Replete electrolytes as needed-- Vancomycin discontinued as noted aboveHematology:     Anemia, stable-- Continue to monitor H/HHistory of chronic ITP, [place] storms macroglobulinemia; thrombocytopenia-- Consult Hematology today given the downtrending plateletsRight IJ thrombus-- Continue to hold off on systemic anticoagulation given thrombocytopenia, waiting Hematology recommendations-- Continue subQ heparin, maintain SCDs at all timesEndocrine: Diabetes, hyperglycemia-- Insulin protocol for hyperglycemia-- Continue to monitor FSs-- Continue to hold home metformin, Lantus, Apidra-- Continue to follow-up endocrinology/diabetes Team recommendations, we appreciate  the consultGI/Nutrition:   Diarrhea, resolved   -- Diet as per Primary Team via PEG-- Continue bowel regimenNeurologic:  Anxiety, depression; delirium without focal deficit-- Continue clonazepam 0.5mg  via PEG BID-- Continue doxepin 10mg  via PEG qHS-- Continue lorazepam 0.5mg  IV q6hrs PRN severe anxiety-- Continue to follow-up psychiatry recommendations, we appreciate the consult-- Continue to follow-up palliative care recommendations, we appreciate the consultPain-- Continue to monitor neurologic status-- Continue acetaminophen 650mg  via PEG q4 hours PRN pain, fever-- Continue oxycodone 10mg  and 90m via PEG q4 hrs PRN moderate to severe pain, respectively-- Continue hydromorphone 1mg  IV q3hrs PRN breakthrough painSedation while intubated-- Continue propofol given ventilator dyssynchrony with weaning; wean off as toleratedPain, Sedation Management:Pain is being adequately controlled with the current regimenSedation is being adequately addressed by this patient's current regimen Musculoskeletal:  -- PT/OT when ableSkin/wound: Frequent repositioning to mitigate against pressure ulcerationGoals of Care/Advance Directives:Code status:   DNRLines:-- PIVs-- Right port (08/12/20)-- PEG (09/14/20)-- Trach-- Foley catheter (10/22/20)Prophylaxis:  -- VAP: HOB elevation; chlorhexidine-- GI: HOB elevation-- VTE: SCD; SQHPlan discussed with:  Primary Service, Consult Services, Critical Care Nursing and Resident, Respiratory Therapy, Pharmacy, and the Patient.  The patient's family was not present for rounds this AM.This patient is critically ill as indicated by the following Acute Respiratory Failure and Septic ShockThe patient has required 40 minutes of my undivided attention during the past 24 hours for one of the previous mentioned life threatening diagnoses. The critical care interventions have included: Management of Acute Respiratory Failure with adjustments to ventilator settings/mode to maintain adequate ventilation / oxygenation  and Management of septic shock with antibiotics, vasopressors with goal MAP > Audreanna Torrisi, MD SICU Attending2/28/2022

## 2020-11-02 NOTE — Plan of Care
Plan of Care Overview/ Patient Status    Pt remains sedated on Propofol, RASS labile -2 to +1 depending on activity, anxious/restless and tearful at times, reassurance provided. PERRL, MAE -- intermittently will squeeze hands on command. Continued mild generalized edema, extremities elevated on pillows. PRN Tylenol and Oxycodone given for pain per CPOT. NSR on Tele, HR 70s-90s. Levo titrated to maintain MAP goal >65,ongoing @ 0.08. T-Max: 100. Trached on PAC settings, 35% FiO2, lungs diminished, in-line suctioned ~ Q1H for larger creamy secretions, oral suctioned for moderate white secretions. Tolerated TF @ goal via PEG, minimal residuals. Abdomen soft, rounded, +BS, +flatus, +BM (small liquid). Foley indwelling with adequate clear-yellow UO (100-175 ml/hr). Insulin gtt titrated per protocol, BG checks Q2H continued. Port needle and dressing changed. Secura cream applied to groin and perianal excoriation. T&P Q2H. Safety maintained. See MAR and flowsheets for further info.

## 2020-11-02 NOTE — Other
-    CONSULT  REQUEST  DOCUMENTATION  -  [facility] NOTE  -  Type of consult: [facility] Hematology   -  New Consult: [id number] [name] /Location: 7112/7112-A / Brief Clinical Question: Evaluate patient with history of ITP with thrombocytopenia, ?concern for HIT/Callback Cell Phone: SICU [phone] / Please confirm receipt of this message by texting back ?OK?  -  1 - Mobile Heartbeat message sent to Mengyang Di at 12:31 PM. Received response at 1231.  Lilia Pro  11/02/2020  12:31 PM  Consult [facility] [phone]

## 2020-11-02 NOTE — Plan of Care
Plan of Care Overview/ Patient Status    Neuro: Opens eyes spontaneously, withdraws to noxious, pupils equal/round/reactive, intermittent spontaneous movement bilateral UEs/LEs, prop continued, able to titrate down to 25 from 40 mcg with PRN dilaudid/oxycodone.CV: tmax 100, NSR 80-90, MAP goal 65, remains on .06 levo, K elev 5.7 then 5.9, insulin/dex admin x 2 and lokelma, ext dwell LUE, new 18G PIV LUE placed, levo through port.Pulm: bivona trach at 14, Q1 inline suction thick white, oral suction Q1 thick, x 2 SBTs one trial 15/5 second 18/5, RR low to mid 20s, at times 30-35.GI: TF through PEG at goal 85 mL/hr, obese, soft/nontender, large liquid light brown BM this AM.GU: clear yellow adequate urine output.Skin: excoriated to buttocks, moisture assoc derm to groin, turn reposition Q2, heels offloaded.No phone calls from family.

## 2020-11-02 NOTE — Progress Notes
Inpatient Diabetes Management Team Follow-up Note    Over the past 24 hours, the patient's glucose control has been at or close to goal    ? Current Nutritional Status:  Continuous tube feeding with Peptamen Intense @ 85 cc/hr  - Total provides: 2040 kcal (2829 kcal w/current propofol rate), 188 gm protein, 155 gm CHO (6.5 gm/hr), and 1714 mL free H2O    ? Current Anti-hyperglycemic Regimen:  glargine 20 units q12 hours, regular HIGH dose sliding scale q6 hours    ?  Home Anti-hyperglycemic Regimen: Toujeo U300 64 units daily, Apidra 20 units TID with meals, metformin 1000 mg bid     ? Other Contributing Medications:   ? Levophed infusion  ? Propofol infusion    PE:    Patient laying in bed sedated on the ventilator  Temp:  [99.5 ?F (37.5 ?C)-100.4 ?F (38 ?C)] 99.86 ?F (37.7 ?C)  Pulse:  [72-98] 83  Resp:  [10-30] 19  BP: (100-130)/(46-82) 104/52  SpO2:  [95 %-100 %] 97 %  Device (Oxygen Therapy): mechanical ventilator     Wt Readings from Last 3 Encounters:   11/02/20 (!) 150 kg   10/06/20 (!) 139.5 kg   09/29/20 (!) 142.4 kg       Data Review:  BGs:      Creatinine (mg/dL)   Date Value   29/56/2130 2.21 (H)     Hemoglobin A1c (%)   Date Value   10/10/2020 9.0 (H)       Assessment:  55 yo h/o type 2 diabetes, HTN, Waldenstroms macroglobulinemia, ETOH, laryngeal SCC s/p trach/PEG presented with SOB significant for tumor invading the trach stoma with partial obstruction and he was started on palliative chemo radiation. Course complicated by multiple episodes of Bivona trach occulusion/obstruction. Patient currently admitted to the SICU for ventilatory support and on IV antibiotics for treatment of pneumonia with septic shock. Diabetes team consulted for assistance in glycemic management.      BG over the past 24 hours have been at or close to goal. He remains on continuous tube feeding at goal rate. Patient also remains on levophed and propofol infusion. Advise redistributing insulin to include 50% as bolus insulin since suspect hyperglycemia is being driven by tube feeding and want to minimize risk of hypoglycemia if tube feeding is interrupted. Also advise decrease in correction insulin. Noted patient received dextrose and insulin for treatment of hyperkalemia this morning.  Plan discussed with covering provider C. DeBell. Given patient on continuous tube feedings, target blood glucose between 140mg /dl-180mg /dL mg/dL.    Recommendations:   1. Continue glargine 30 units qAM  2. Discontinue glargine 30 units qHS  3. Start regular insulin 9 units q6 hours starting at 12PAM. HOLD if TF is held  4. Decrease regular to MID dose sliding scale q6 hours  5. Blood glucose monitoring q6 hours  6. Diet: tube feeds per nutrition and primary team      Please let us know if you have any questions or concerns about our recommenations. We will continue to follow the patient along with you during his hospitalzation as we attempt to optimize his glycemic control.  Detailed recommendations were communicated to the primary team both verbally and/or through this written note.  I spent a total of 20 minutes with the patient of which 15 minutes (over 50%) was spent in counseling the patient regarding diabetes management and coordinating care with the primary team.     Signed:  Everlene Farrier, APRN  Inpatient  Diabetes Management Team   [facility]: [phone]  Diabetes consult pager # [phone]      NOTE: During nights, weekends, and holidays, please contact the on-call Endocrine Fellow at pager [phone].

## 2020-11-03 ENCOUNTER — Encounter: Admit: 2020-11-03 | Payer: PRIVATE HEALTH INSURANCE | Attending: Radiation Oncology

## 2020-11-03 ENCOUNTER — Inpatient Hospital Stay: Admit: 2020-11-03 | Payer: PRIVATE HEALTH INSURANCE

## 2020-11-03 DIAGNOSIS — J9601 Acute respiratory failure with hypoxia: Secondary | ICD-10-CM

## 2020-11-03 LAB — CBC WITH AUTO DIFFERENTIAL
BKR WAM ABSOLUTE IMMATURE GRANULOCYTES.: 0.51 x 1000/ÂµL — ABNORMAL HIGH (ref 0.00–0.30)
BKR WAM ABSOLUTE LYMPHOCYTE COUNT.: 0.3 x 1000/ÂµL — ABNORMAL LOW (ref 0.60–3.70)
BKR WAM ABSOLUTE NRBC (2 DEC): 0 x 1000/ÂµL (ref 0.00–1.00)
BKR WAM ABSOLUTE NRBC (2 DEC): 0 x 1000/ÂµL (ref 0.00–1.00)
BKR WAM ANALYZER ANC: 7.5 x 1000/ÂµL (ref 2.00–7.60)
BKR WAM BASOPHIL ABSOLUTE COUNT.: 0.03 x 1000/ÂµL (ref 0.00–1.00)
BKR WAM BASOPHILS: 0.3 % (ref 0.0–1.4)
BKR WAM EOSINOPHIL ABSOLUTE COUNT.: 0.31 x 1000/ÂµL (ref 0.00–1.00)
BKR WAM EOSINOPHILS: 3.2 % (ref 0.0–5.0)
BKR WAM HEMATOCRIT (2 DEC): 26.4 % — ABNORMAL LOW (ref 38.50–50.00)
BKR WAM HEMATOCRIT (2 DEC): 26.9 % — ABNORMAL LOW (ref 38.50–50.00)
BKR WAM HEMOGLOBIN: 8 g/dL — ABNORMAL LOW (ref 13.2–17.1)
BKR WAM HEMOGLOBIN: 8 g/dL — ABNORMAL LOW (ref 13.2–17.1)
BKR WAM IMMATURE GRANULOCYTES: 5.3 % — ABNORMAL HIGH (ref 0.0–1.0)
BKR WAM LYMPHOCYTES: 3.1 % — ABNORMAL LOW (ref 17.0–50.0)
BKR WAM MCH (PG): 27.2 pg (ref 27.0–33.0)
BKR WAM MCH (PG): 26.7 pg — ABNORMAL LOW (ref 27.0–33.0)
BKR WAM MCHC: 30.3 g/dL — ABNORMAL LOW (ref 31.0–36.0)
BKR WAM MCHC: 29.7 g/dL — ABNORMAL LOW (ref 31.0–36.0)
BKR WAM MCV: 89.8 fL (ref 80.0–100.0)
BKR WAM MCV: 89.7 fL (ref 80.0–100.0)
BKR WAM MONOCYTE ABSOLUTE COUNT.: 0.9 x 1000/ÂµL (ref 0.00–1.00)
BKR WAM MONOCYTES: 9.4 % (ref 4.0–12.0)
BKR WAM MPV: 12 fL (ref 8.0–12.0)
BKR WAM MPV: 12 fL (ref 8.0–12.0)
BKR WAM NEUTROPHILS: 78.7 % — ABNORMAL HIGH (ref 39.0–72.0)
BKR WAM NUCLEATED RED BLOOD CELLS: 0 % (ref 0.0–1.0)
BKR WAM NUCLEATED RED BLOOD CELLS: 0 % (ref 0.0–1.0)
BKR WAM PLATELETS: 35 x1000/ÂµL — ABNORMAL LOW (ref 150–420)
BKR WAM PLATELETS: 34 x1000/ÂµL — ABNORMAL LOW (ref 150–420)
BKR WAM RDW-CV: 17.3 % — ABNORMAL HIGH (ref 11.0–15.0)
BKR WAM RDW-CV: 17.2 % — ABNORMAL HIGH (ref 11.0–15.0)
BKR WAM RED BLOOD CELL COUNT.: 2.94 M/ÂµL — ABNORMAL LOW (ref 4.00–6.00)
BKR WAM RED BLOOD CELL COUNT.: 3 M/ÂµL — ABNORMAL LOW (ref 4.00–6.00)
BKR WAM WHITE BLOOD CELL COUNT: 9.3 x1000/ÂµL (ref 4.0–11.0)
BKR WAM WHITE BLOOD CELL COUNT: 9.6 x1000/ÂµL (ref 4.0–11.0)

## 2020-11-03 LAB — MAGNESIUM: BKR MAGNESIUM: 2 mg/dL (ref 1.7–2.4)

## 2020-11-03 LAB — BASIC METABOLIC PANEL
BKR ANION GAP: 9 (ref 7–17)
BKR BLOOD UREA NITROGEN: 64 mg/dL — ABNORMAL HIGH (ref 6–20)
BKR BLOOD UREA NITROGEN: 65 mg/dL — ABNORMAL HIGH (ref 6–20)
BKR BUN / CREAT RATIO: 31.4 — ABNORMAL HIGH (ref 8.0–23.0)
BKR BUN / CREAT RATIO: 31.3 — ABNORMAL HIGH (ref 8.0–23.0)
BKR CALCIUM: 9.6 mg/dL (ref 8.8–10.2)
BKR CALCIUM: 9.8 mg/dL (ref 8.8–10.2)
BKR CHLORIDE: 110 mmol/L — ABNORMAL HIGH (ref 98–107)
BKR CHLORIDE: 110 mmol/L — ABNORMAL HIGH (ref 98–107)
BKR CO2: 20 mmol/L (ref 20–30)
BKR CREATININE: 2.04 mg/dL — ABNORMAL HIGH (ref 0.40–1.30)
BKR CREATININE: 2.08 mg/dL — ABNORMAL HIGH (ref 0.40–1.30)
BKR EGFR (AFR AMER): 41 mL/min/{1.73_m2} (ref >60)
BKR EGFR (AFR AMER): 40 mL/min/{1.73_m2} (ref >60)
BKR EGFR (NON AFRICAN AMERICAN): 34 mL/min/{1.73_m2} (ref >60)
BKR EGFR (NON AFRICAN AMERICAN): 33 mL/min/{1.73_m2} (ref >60)
BKR GLUCOSE: 177 mg/dL — ABNORMAL HIGH (ref 70–100)
BKR GLUCOSE: 132 mg/dL — ABNORMAL HIGH (ref 70–100)
BKR POTASSIUM: 5.4 mmol/L — ABNORMAL HIGH (ref 3.3–5.3)
BKR POTASSIUM: 5.5 mmol/L — ABNORMAL HIGH (ref 3.3–5.3)
BKR SODIUM: 139 mmol/L (ref 136–144)
BKR SODIUM: 142 mmol/L (ref 136–144)

## 2020-11-03 LAB — MANUAL DIFFERENTIAL
BKR WAM BANDS (DIFF) (1 DEC): 1.7 % (ref 0.0–10.0)
BKR WAM BASOPHIL - ABS (DIFF) 2 DEC: 0 x 1000/ÂµL (ref 0.00–1.00)
BKR WAM BASOPHILS (DIFF): 0 % (ref 0.0–1.4)
BKR WAM EOSINOPHILS (DIFF) 2 DEC: 0.48 x 1000/ÂµL (ref 0.00–1.00)
BKR WAM EOSINOPHILS (DIFF): 5.2 % — ABNORMAL HIGH (ref 0.0–5.0)
BKR WAM LYMPHOCYTE - ABS (DIFF) 2 DEC: 0.17 x 1000/ÂµL — ABNORMAL LOW (ref 0.60–3.70)
BKR WAM LYMPHOCYTES (DIFF): 1.8 % — ABNORMAL LOW (ref 17.0–50.0)
BKR WAM METAMYELOCYTES (DIFF) 1 DEC: 1.7 % — ABNORMAL HIGH (ref 0.0–1.0)
BKR WAM MONOCYTE - ABS (DIFF) 2 DEC: 0.57 x 1000/ÂµL (ref 0.00–1.00)
BKR WAM MONOCYTES (DIFF): 6.1 % (ref 4.0–12.0)
BKR WAM MYELOCYTES (DIFF) 1 DEC: 1.7 % — ABNORMAL HIGH (ref 0.0–0.0)
BKR WAM NEUTROPHILS (DIFF): 81.8 % — ABNORMAL HIGH (ref 39.0–72.0)
BKR WAM NEUTROPHILS - ABS (DIFF) 2 DEC: 7.77 x 1000/ÂµL — ABNORMAL HIGH (ref 2.00–7.60)

## 2020-11-03 LAB — IMMATURE PLATELET FRACTION (BH GH LMW YH)
BKR WAM IPF, ABSOLUTE: 2 x1000/ÂµL (ref <20.0)
BKR WAM IPF, ABSOLUTE: 2.8 x1000/ÂµL (ref <20.0)
BKR WAM IPF: 5.8 % (ref 1.2–8.6)
BKR WAM IPF: 8.2 % (ref 1.2–8.6)

## 2020-11-03 LAB — C. DIFFICILE ASSAY
BKR C. DIFFICILE GDH ANTIGEN: NEGATIVE
BKR C. DIFFICILE RAPID TOXIN: NEGATIVE

## 2020-11-03 LAB — BLOOD CULTURE   (BH GH L LMW YH)
BKR BLOOD CULTURE: NO GROWTH
BKR BLOOD CULTURE: NO GROWTH

## 2020-11-03 LAB — PHOSPHORUS     (BH GH L LMW YH): BKR PHOSPHORUS: 4.3 mg/dL (ref 2.2–4.5)

## 2020-11-03 LAB — POTASSIUM: BKR POTASSIUM: 5.4 mmol/L — ABNORMAL HIGH (ref 3.3–5.3)

## 2020-11-03 MED ORDER — INSULIN GLARGINE 100 UNIT/ML SUBCUTANEOUS SOLUTION
100 unit/mL | Freq: Two times a day (BID) | SUBCUTANEOUS | Status: DC
Start: 2020-11-03 — End: 2020-11-10
  Administered 2020-11-04 – 2020-11-10 (×14): 100 mL via SUBCUTANEOUS

## 2020-11-03 MED ORDER — FAMOTIDINE 20 MG TABLET
20 mg | Freq: Every day | JEJUNOSTOMY | Status: DC
Start: 2020-11-03 — End: 2020-11-23
  Administered 2020-11-04 – 2020-11-22 (×18): 20 mg via JEJUNOSTOMY

## 2020-11-03 NOTE — Plan of Care
Plan of Care Overview/ Patient Status    Patient sedated on propofol with prn meds given. RASS -1 to +1. Not following commands, no purposeful movements but localizing. +PERRLA 2-3. BL soft wrists. Tmax 100.5, tylenol given. SR 80-90s. MAP goal met with levophed. Insulin gtt started, titrated per protocol. Propofol titrated to 20. K rechecked at 00:00. Rectal tube putting out large amounts of liquid stool, 2nd dose lokelma held. Foley with large hourly output. swtiched back to Pressure support overnight. Tolerating well, hourly - Q2 suctioning for thick white, tan secretions (in-line and orally). BS coarse and diminished. poc continued overnight. Safety maintained.

## 2020-11-03 NOTE — Other
[facility]    [facility]     Palliative Care Follow-Up Note    Present History   Chart reviewed and case discussed with SICU team. No family at bedside during my assessment today. Arthur [name] appeared comfortable today. Remains on propofol. [name] was dislodged again this morning but was replaced with no further issue. Levophed is off as of this afternoon.     I reached out to patient's sister Arthur [name] this afternoon via phone. Arthur [name] shared that she was at another hospital with her son, who is undergoing cancer treatment. She acknowledged she is dealing with a lot and has not been able to get to [facility] to visit Arthur [name] in recent days. She does hope to come on Thursday for Arthur [name]'s birthday. She was able to FaceTime with Sela Hua a few days ago and found him alert and able to communicate by blinking yes/no. Her sons were also able to interact with him, and she shares that [place] was smiling in response. She reflected on discussion of last Friday's family meeting, notes that the decision to make him DNR was difficult but she does think it was the right choice. Emotional support extended and well received.     Review of Allergies/Hx/Meds   Allergies  Patient has No Known Allergies.    Medical History  Past Medical History:   Diagnosis Date   ? Diabetes mellitus (HC Code) (HC CODE)    ? Hypercholesteremia    ? Hypertension        Family History  Family History   Problem Relation Age of Onset   ? Aneurysm Mother        Social History  Social History     Socioeconomic History   ? Marital status: Legally Separated     Spouse name: Not on file   ? Number of children: Not on file   ? Years of education: Not on file   ? Highest education level: Not on file   Occupational History   ? Not on file   Tobacco Use   ? Smoking status: Former Smoker     Packs/day: 1.00     Years: 30.00     Pack years: 30.00     Quit date: 03/04/2013     Years since quitting: 7.6   Substance and Sexual Activity   ? Alcohol use: Yes Alcohol/week: 14.0 standard drinks     Types: 14 Cans of beer per week   ? Drug use: Not on file   ? Sexual activity: Not on file   Other Topics Concern   ? Not on file   Social History Narrative   ? Not on file     Social Determinants of Health     Financial Resource Strain:    ? Difficulty of Paying Living Expenses:    Food Insecurity:    ? Worried About Programme researcher, broadcasting/film/video in the Last Year:    ? Barista in the Last Year:    Transportation Needs:    ? Freight forwarder (Medical):    ? Lack of Transportation (Non-Medical):    Physical Activity:    ? Days of Exercise per Week:    ? Minutes of Exercise per Session:    Stress:    ? Feeling of Stress :    Social Connections:    ? Frequency of Communication with Friends and Family:    ? Frequency of Social Gatherings with Friends and Family:    ?  Attends Religious Services:    ? Active Member of Clubs or Organizations:    ? Attends Banker Meetings:    ? Marital Status:    Intimate Partner Violence:    ? Fear of Current or Ex-Partner:    ? Emotionally Abused:    ? Physically Abused:    ? Sexually Abused:        InPatient Medications  Current Facility-Administered Medications   Medication Dose Route Frequency Last Rate   ? acetaminophen  650 mg Per G Tube Q6H PRN     ? albuterol  2.5 mg Nebulization Q6H WA     ? ascorbic acid  250 mg Per G Tube Daily     ? chlorhexidine gluconate  15 mL Mouth/Throat Q12H     ? clonazePAM  0.5 mg Per J Tube BID     ? insulin regular human 100 units in sodium chloride 0.9 % 100 mL   Intravenous Continuous 5 Units/hr (11/03/20 1116)    And   ? dextrose injection  25 g Intravenous Q15 MIN PRN      And   ? dextrose injection  12.5 g Intravenous Q15 MIN PRN     ? doxepin  10 mg Per J Tube Nightly     ? doxycycline hyclate  100 mg Per G Tube Q12H     ? [START ON 11/04/2020] famotidine  20 mg Per J Tube Daily     ? folic acid  1 mg Per G Tube Daily     ? glucagon  1 mg Intramuscular Once PRN     ? [Held by provider] heparin (porcine)  7,500 Units Subcutaneous Q8H     ? HYDROmorphone  1 mg IV Push Q3H PRN     ? insulin glargine  15 Units Subcutaneous Q12H     ? [Held by provider] insulin glargine  30 Units Subcutaneous Daily     ? [Held by provider] insulin regular human  9 Units Subcutaneous Q6H     ? LORazepam  0.5 mg IV Push Q6H PRN     ? miconazole nitrate   Topical (Top) BID     ? midazolam (PF)         ? norepinephrine  0.02-3 mcg/kg/min (Dosing Weight) Intravenous Continuous 0.02 mcg/kg/min (11/03/20 1100)   ? oxyCODONE  10 mg Per G Tube Q4H PRN      Or   ? oxyCODONE  15 mg Per G Tube Q4H PRN     ? propofol (DIPRIVAN) 1,000 mg in 100 mL (10 mg/mL)  5-80 mcg/kg/min Intravenous Continuous 20 mcg/kg/min (11/03/20 1100)   ? [Held by provider] sodium zirconium cyclosilicate  10 g Oral Q8H     ? thiamine  50 mg Per G Tube Daily     ? zinc oxide  1 Application Topical (Top) DAILY PRN         [email or web address]    Palliative Performance Scale 30%   (Modification of Karnofsky and was designed for measurement of physical status in Palliative Care. Only 10% of patients with PPS score <=50% would be expected to survive >6 months)    Physical Exam  Vitals and nursing note reviewed.   Constitutional:       General: He is not in acute distress.     Appearance: He is ill-appearing.      Interventions: He is sedated.   Pulmonary:      Effort: No respiratory distress.      Comments:  Trached to vent  Neurological:      Comments: Sedated         Vitals:  Temp:  [99.5 ?F (37.5 ?C)-100.76 ?F (38.2 ?C)] 100.22 ?F (37.9 ?C)  Pulse:  [84-98] 94  Resp:  [6-38] 15  BP: (104-157)/(50-71) 117/61  SpO2:  [95 %-100 %] 97 %  ECOG Performance Status (non-calculated): 3  calc. ECOG (from Karnofsky): 2  Wt Readings from Last 3 Encounters:   11/02/20 (!) 148 kg   10/06/20 (!) 139.5 kg   09/29/20 (!) 142.4 kg       Review of Labs/Diagnostics  Complete Blood Count:  Recent Labs   Lab 11/01/20  0434 11/02/20  0452 11/03/20  0410   WBC 10.4 9.3 9.3   HGB 8.1* 8.1* 8.0*   HCT 27.80* 26.00* 26.40*   PLT 86* 62* 35*     Comprehensive Metabolic Panel:  Recent Labs   Lab 11/02/20  0452 11/02/20  0508 11/02/20  1526 11/02/20  1754 11/02/20  1759 11/02/20  1846 11/03/20  0001 11/03/20  0106 11/03/20  0410 11/03/20  0531 11/03/20  1118   NA 138  --  138  --   --   --   --   --  139  --   --    K 5.7*   < > 5.9*   < > 5.9*  --  5.4*  --  5.4*  --   --    CL 109*  --  108*  --   --   --   --   --  110*  --   --    CO2 19*  --  19*  --   --   --   --   --  20  --   --    BUN 68*  --  66*  --   --   --   --   --  64*  --   --    CREATININE 2.21*  --  2.14*  --   --   --   --   --  2.04*  --   --    GLU 217*   < > 267*   < >  --    < >  --    < > 177*   < > 125*   CALCIUM 9.6  --  9.4  --   --   --   --   --  9.6  --   --     < > = values in this interval not displayed.     No results for input(s): ALKPHOS, BILITOT, BILIDIR, PROT, ALT, AST in the last 168 hours.    Invalid input(s): LABALBU    Diagnostics:  XR Chest PA or AP    Result Date: 11/03/2020  XR CHEST PA OR AP Date: 11/03/2020 10:13 AM INDICATION: Evaluate position of trach COMPARISON: XR CHEST PA OR AP 2022-Feb-24 FINDINGS: Support Devices: Lines and tubes are in stable position, including the tracheostomy tube with tip in the mid thoracic trachea. Lungs and Pleura: Stable patchy bilateral airspace opacities. No pneumothorax. Heart and Mediastinum:  Cardiomediastinal contours are unchanged.     No change compared to the study from one week prior. Tracheostomy tube in appropriate position. Reported And Signed By: Leilani Merl, MD  [facility] Radiology and Biomedical Imaging        Impression/Plan:   Nicholos Aloisi is a 55 y.o. 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 Admitted to SICU on 2/9 for ventilator management following RRT on oncology floor for desaturation and increased work of breathing while on trach mask, ultimately placed back on vent. Referred for Palliative Care Consult by Attending Provider: Alric Quan, MD [phone] for supportive care.    Coping/Support:   - Ongoing assessments for psychosocial and/or spiritual needs  - Full interdisciplinary team support available as needed  - Appreciate primary team advocating with nurse management for an exemption to visitor restrictions to allow 2 visitors, as patient's sister reports mobility challenges making visitation on her own difficult  - Appreciate primary team and nursing helping facilitate communication between patient and his sister and niece    Advance Care Planning:  - Made DNR today following family meeting of 2/25  - No advance directives or health care proxy documentation on file  - His sister and niece are listed as emergency contacts and his sister is next of kin and default proxy decision maker in the event that patient lacks capacity    Goals of Care:   - Ongoing discussions. Family aware of concern for worsening condition and overall poor prognosis, do not want to see him suffer. Following most recent family meeting, they elected to make him DNR but otherwise continue with the current plan of care, as they believe this best represents what Sela Hua would choose for himself if he were able to verbalize his wishes.     Thank you for involving the Palliative Care team in the care of this patient. The Palliative Care team will continue to follow.    Patient was evaluated: in person    Time committed: > 15 minutes were spent in chart review, coordination of care, and at the bedside in patient/family counseling and education, including detailed concepts of symptom managment and goals of care/advanced care planning.    Signed:  Debbrah Alar, APRN  Palliative Care Consult Service  11/03/2020 12:28 PM

## 2020-11-03 NOTE — Progress Notes
Surgical Intensive Care Unit  AttendingI have personally performed a face to face diagnostic evaluation on this patient, reviewed the chart, available data and care plan and have personally formulated the plan outlined below. ?I have seen this patient in addition to the Surgical ICU advanced practitioner given patient's complexity and associated need for critical care. ?55 y.o. male admitted to the surgical intensive care unit on 10/14/2020 for on 10/09/2020 for respiratory failure.  Briefly, Arthur [name] is a 55 y.o. 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  The patient's hospital course was significant for a knee tracheostomy exchange 10/10/20, complicated by episodes of hypoxia requiring suctioning of mucus plugs and transferred to the surgical intensive care unit on 10/14/2020.  The patient received palliative radiation treatments, completed on 10/28/20.  His hospital course has been notable for altered mental status and decreased responsiveness, continued vasopressor requirement, pressure support trials, ongoing antibiotic treatment for possible aspiration pneumonia.Hospital Day 25 Past Medical History: Diagnosis Date ? Diabetes mellitus (HC Code) (HC CODE)  ? Hypercholesteremia  ? Hypertension  No past surgical history on file.Scheduled Meds:Current Facility-Administered Medications Medication Dose Route Frequency Provider Last Rate Last Admin ? albuterol neb sol 2.5 mg/3 mL (0.083%) (PROVENTIL,VENTOLIN)  2.5 mg Nebulization Q6H [place], [place], [place]   2.5 mg at 11/03/20 5284 ? ascorbic acid (vitamin C) (VITAMIN C) tablet 250 mg 250 mg Per G Tube Daily Jennye Boroughs, PA   250 mg at 11/02/20 1034 ? calcium gluconate 1 g in sodium chloride 0.9% 100 mL IVPB (mini-bag plus)  1 g Intravenous Once Bettey Costa, PA     ? chlorhexidine gluconate (PERIDEX) 0.12 % solution 15 mL  15 mL Mouth/Throat Q12H Leonia Reeves, PA   15 mL at 11/02/20 2151 ? clonazePAM (KlonoPIN) tablet 0.5 mg  0.5 mg Per J Tube BID Mat Carne, APRN   0.5 mg at 11/02/20 2151 ? doxepin (SINEquan) solution 10 mg  10 mg Per J Tube Nightly Mat Carne, APRN   10 mg at 11/02/20 2151 ? doxycycline TAB/CAP 100 mg  100 mg Per G Tube Q12H Mat Carne, APRN   100 mg at 11/02/20 2151 ? folic acid (FOLVITE) tablet 1 mg  1 mg Per G Tube Daily [place], [place], [place]   1 mg at 11/02/20 1035 ? [Held by provider] heparin (porcine) injection 7,500 Units  7,500 Units Subcutaneous Q8H [place], [place], [place]   7,500 Units at 11/02/20 2151 ? [Held by provider] insulin glargine (Semglee,Lantus) injection 30 Units  30 Units Subcutaneous Daily Ciccia, Oneida Arenas, PA     ? [Held by provider] insulin regular human (HumuLIN R, NovoLIN R) 100 unit/mL injection 9 Units  9 Units Subcutaneous Q6H [place], Kandis Nab, APRN     ? miconazole nitrate (SECURA EXTRA THICK ANTIFUNGAL) 2 % cream   Topical (Top) BID Lisette Abu, PA   Given by Other. at 11/02/20 2147 ? midazolam (PF) (VERSED) 1 mg/mL injection          ? [Held by provider] sodium zirconium cyclosilicate (LOKELMA) oral powder packet 10 g  10 g Oral Q8H [place], [place], [place]     ? thiamine (VITAMIN B1) tablet 50 mg  50 mg Per G Tube Daily Jennye Boroughs,  PA   50 mg at 11/02/20 1035 Continuous Infusions:? insulin regular human 100 units in sodium chloride 0.9 % 100 mL 6.5 mL/hr at 11/03/20 0700 ? norepinephrine 0.02 mcg/kg/min (11/03/20 0700) ? propofol (DIPRIVAN) 1,000 mg in 100 mL (10 mg/mL) 20 mcg/kg/min (11/03/20 0700) PRN Meds: acetaminophen, Initiate Insulin Infusion Protocol for Adult Patients (Nurse-Driven) **AND** [COMPLETED] insulin regular human **AND** insulin regular human 100 units in sodium chloride 0.9 % 100 mL **AND** POC Glucose (Fingerstick) **AND** Nursing communication **AND** dextrose injection **AND** dextrose injection, glucagon, HYDROmorphone, LORazepam, oxyCODONE **OR** oxyCODONE, zinc oxideVITALS:Temp (24hrs), Avg:99.9 ?F (37.7 ?C), Min:99.5 ?F (37.5 ?C), Max:100.76 ?F (38.2 ?C)Vitals:  11/03/20 0700 11/03/20 0735 11/03/20 0740 11/03/20 0800 BP: (!) 117/51   (!) 118/52 Pulse: 89 (!) 92 (!) 91 (!) 91 Resp: (!) 11 (!) 24 (!) 10 16 Temp: (!) 100.04 ?F (37.8 ?C) 99.86 ?F (37.7 ?C) 99.86 ?F (37.7 ?C) 99.86 ?F (37.7 ?C) TempSrc: Bladder    SpO2: 97% 100% 97% 97% Weight:     Height:     Intake/Output Summary (Last 24 hours) at 11/03/2020 0809Last data filed at 11/03/2020 0700Gross per 24 hour Intake 4022.85 ml Output 5060 ml Net -1037.15 ml Exam unchanged as of 11/03/2020 with exceptions noted belowPhysical ExamLabs Last 24 hours:Most Recent Result Component Value Date/Time  Sodium 139 11/03/2020 04:10 AM  Potassium 5.4 (H) 11/03/2020 04:10 AM  Chloride 110 (H) 11/03/2020 04:10 AM  CO2 20 11/03/2020 04:10 AM  BUN 64 (H) 11/03/2020 04:10 AM  Creatinine 2.04 (H) 11/03/2020 04:10 AM  WBC 9.3 11/03/2020 04:10 AM  Hemoglobin 8.0 (L) 11/03/2020 04:10 AM  Hematocrit 26.40 (L) 11/03/2020 04:10 AM  Platelets 35 (L) 11/03/2020 04:10 AM Imaging Last 24 hours:No results found.Overnight Events:-- Continued norepinephrine drip with deceased requirement-- Decreased inspiratory pressure-- Hyperkalemia s/p treatment-- Thrombocytopenia, Hematology consultedInfectious Disease/Sepsis: MRSA pneumonia-- Continue to monitor WBC count and fever curve-- Continue doxycycline (end date 11/07/20)-- Prior antibiotics:  Vancomycin (2/5 - 2/14, 2/16 - 2/22); ceftazidime (2/17 - 2/19); ciprofloxacin (2/19 - 2/23)HEENT:Squamous cell laryngeal cancer invading tracheostomy, status post tracheostomy exchange 10/10/20 and biopsy on 10/12/20-- Continue Bivona tracheostomy to bypass obstruction, maintain additional tracheostomy at the bedside-- Completed course of palliative radiation as per oncology recommendations (2/16 - 2/23)-- Continue to follow-up Oncology recommendations, we appreciate the consult-- Continue to follow-up with a primary team (ENT) recommendations; no surgical management at this time as per their recommendationsCardiac: Septic shock-- Continue to monitor hemodynamic status-- Continue norepinephrine with goal MAP > 65 mmHg, wean off as toleratedHistory of hypertension, hyperlipidemia-- Continue to hold home lisinopril, Lasix, Crestor until hemodynamically stable and tolerating PORespiratory: Respiratory failure with circumferential squamous cell laryngeal cancer invading stoma, with intermittent mucus plugging/tracheal occlusion-- Wean off mechanical ventilation as tolerated-- Encourage IS and pulmonary toilet when able-- Continue albuterol nebs q6 hours while awake-- Continue chest physiotherapy q6 hrs hours while awakeRenal, Fluids/Electrolytes: AKI on CKD (baseline creatinine 1.5 to 1.6), with recent supratherapeutic vancomycin level, now improving-- Maintain Foley catheter for the strict monitoring of I/O in this critically ill patient; placed for urinary retention on 10/22/20, will perform voiding trial when more stable-- Replete electrolytes as needed-- Vancomycin discontinued as noted aboveHematology:     Anemia, stable-- Continue to monitor H/HHistory of chronic ITP, [place] storms macroglobulinemia; thrombocytopenia-- Consult Hematology today given the downtrending plateletsRight IJ thrombus-- Continue to hold off on systemic anticoagulation given thrombocytopenia, waiting Hematology recommendations-- Continue subQ heparin, maintain SCDs at all timesThromboctopenia-- Possible steroids vs IVIG with continued thrombocytopenia as per Hematology recommendations-- Continue  to follow-up Hematology recommendations; as per their evaluation and recent response to respiratory steroids, thrombocytopenia possibly secondary to ITP, hold HITT panel held as per Hematology recommendationsEndocrine:  Diabetes, hyperglycemia-- Insulin protocol for hyperglycemia-- Continue to monitor FSs-- Continue to hold home metformin, Lantus, Apidra-- Continue to follow-up endocrinology/diabetes Team recommendations, we appreciate the consultGI/Nutrition:   Diarrhea s/p hyperkalemia -- Diet as per Primary Team via PEG-- Continue bowel regimenNeurologic:  Anxiety, depression; delirium without focal deficit-- Continue clonazepam 0.5mg  via PEG BID-- Continue doxepin 10mg  via PEG qHS-- Continue lorazepam 0.5mg  IV q6hrs PRN severe anxiety-- Continue to follow-up Psychiatry recommendations, we appreciate the consult-- Continue to follow-up Palliative Care recommendations, we appreciate the consultPain-- Continue to monitor neurologic status-- Continue acetaminophen 650mg  via PEG q4 hours PRN pain, fever-- Continue oxycodone 10mg  and 45m via PEG q4 hrs PRN moderate to severe pain, respectively-- Continue hydromorphone 1mg  IV q3hrs PRN breakthrough painSedation while intubated-- Continue propofol given ventilator dyssynchrony with weaning; wean off as toleratedPain, Sedation Management:Pain is being adequately controlled with the current regimenSedation is being adequately addressed by this patient's current regimen Musculoskeletal:  -- PT/OT when ableSkin/wound: Frequent repositioning to mitigate against pressure ulcerationGoals of Care/Advance Directives:Code status:   DNRLines:-- PIVs-- Right port (08/12/20)-- PEG (09/14/20)-- Trach-- Foley catheter (10/22/20)Prophylaxis:  -- VAP: HOB elevation; chlorhexidine-- GI: HOB elevation; famotidine-- VTE: SCD; SQH held secondary to thrombocytopeniaPlan discussed with:  Primary Service, Consult Services, Critical Care Nursing and Resident, Respiratory Therapy, Pharmacy, and the Patient.  The patient's family was not present for rounds this AM.This patient is critically ill as indicated by the following Acute Respiratory FailureThe patient has required 35 minutes of my undivided attention during the past 24 hours for one of the previous mentioned life threatening diagnoses. The critical care interventions have included: Management of Acute Respiratory Failure with adjustments to ventilator settings/mode to maintain adequate ventilation / oxygenation Lonia Mad, MD SICU Attending3/09/2020

## 2020-11-03 NOTE — Plan of Care
Plan of Care Overview/ Patient Status    Opens eyes spontaneously, not following commands, cries when awake.  Also attempts to sit forward in the bed suddenly, posing a risk to his airway.  At one point this AM when this occurred, the trach slipped out approximately 5 cm, and patient was not receiving volumes from the ventilator, HR to 130s, unable to suction.  Trach replaced by fellow, no further issues at this time.  Propofol continues, as well as PRN pain meds.  Restraints in place to protect airway.  Levo to maintain MAP 65, currently off.  Vent settings/mode as charted, weaned as tolerated.  Suction approx q2h for large thick tan secretions, also coughing around trach.  TF at goal.  Rectal tube in place with liquid stool.  Urine output adequate.  Skin as documented.  No family at bedside or via telephone this shift.  Insulin gtt per protocol.

## 2020-11-03 NOTE — Progress Notes
Otolaryngology Progress NoteDate: 11/03/2020 Interim History- NAEO- pending custom trach- continues vent support, weaning norepi gtt VitalsTemp:  [37.5 ?C-38.2 ?C] 37.5 ?CPulse:  [82-96] 85Resp:  [6-38] 17BP: (102-135)/(49-71) 112/57SpO2:  [95 %-100 %] 98 %Device (Oxygen Therapy): mechanical ventilator I/O last 3 completed shifts:In: 4673.5 [I.V.:2158.5; NG/GT:2015; IV Piggyback:500]Out: 4020 [Urine:3620; Stool:400]Physical ExamGen: alert, vented PCEyes: open, normal external appearanceFace: grossly symmetricNose: clear anteriorlyOP/OC: hemostaticNeck: Bivona 8 cuffed secured w/soft trach ties, necrotic but hemostatic mass surrounding the stoma, remaining neck flat, firm tumor Pulm: no increased WOB on TMDiagnostic DataReviewed, per chartAssessment & Plan54 M with PMH of HTN, HLD, DM, tobacco and EtOH use, [place] macroglobulinemia, 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 Admitted to SICU for ventilatory support. No acute ENT intervention planned; no curative or meaningful surgical options at this time. Course complicated by multiple episodes of Bivona trach occlusion/obstruction.  - will order custom trach- no ENT intervention planned at this time; case discussed at tumor board 2/14 and is not a surgical candidate - disease management/goals-of-care best pursued via non-surgical means; eg, Medical Oncology, Radiation Oncology, Palliative Care- transfer to Medical Oncology floor when eligible - continue routine trach care with careful suctioning- ensure trach well secured with tiesElectronically Signed VW:[id number] Milarose Savich DDSContact: [id number]/20227:[id number] AM

## 2020-11-03 NOTE — Progress Notes
Re: Arthur [name] (30-Sep-1965)MRN: [id number] Provider: Rachael Fee, MDDate of Service: 3/1/2022TREATMENT SUMMARY NOTEReferring Physician: Dr. Pacileo/Dr. MehraDIAGNOSIS: SCC of the larynxSTAGE: [id number], stage IVHPI, PHYSICAL EXAMINATION, AND TREATMENT POLICYMr. Lager's history is as follows:?Full oncologic history in the process of being obtained from [facility]. Patient is currently undergoing concurrent chemoRT for advanced laryngeal cancer. ?He was seen by his oncology team in [place] on 10/06/20 and was noted to have necrotic mass surrounding his trach stoma, and he was noting SOB with increased WOB. Imaging was not obtained as he could not lie flat. He was initially admitted to [facility], and then transferred to [facility] after difficulties with deep suction of his trach and concern for obstruction.?FFL was performed by ENT, which showed partial distal obstruction, but airway open after that. ?Procedure note: The scope was passed through the nasal cavity. The nasopharynx appears normal. The vallecula and base of tongue appear normal. The superior epiglottis is crisp. There is an irregularity of the L AE fold, arytenoid, and the VC, however, clear visualization was not achievable due to significant secretion burden. The R AE fold, arytenoid, and VC appear grossly normal. Mobility of the VC were not able to be assessed. There is significant secretion burden obstructing the view of the hypopharynx.The scope was then passed through the tracheostomy tube. There is are mobile pale flaps right passed the distal end of the tube causing partial obstruction during respiration. Just passed this, there is left posterolateral mass that is causing a fixed partial obstruction. Passed this, the airway is patent and then carina is visualized.?On 10/10/20, he went to OR for trach exchange for B[place]ona. ?Updated imaging was obtained: ?10/10/20 [place] neck: IMPRESSION: Large transspatial necrotic laryngeal mass with stomal invasion and involvement of the thyroid gland, trachea, hypopharynx and proximal cervical esophagus as above. 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 ?[place] chest: IMPRESSION: Partially visualized soft tissue neck mass which is also characterized on the concurrent neck [place]. There is mediastinal adenopathy and circumferential thickening of the trachea concerning for metastatic disease.Bilateral dependent consolidations likely represent aspiration. Pulmonary nodules measuring up to 6 mm which are nonspecific but should be followed on future imaging per oncology protocol to exclude metastatic disease.?He is overall doing well now s/p trach exchange (he now has [place]ova). His course however has been complicated by difficult to control DM and history of CKD with prior electrolyte disturbances, and he has been followed by Medicine while in-house. 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 Daphene Jaeger (Med Onc) and Dr. Quenton Fetter (Rad Onc). I spoke on the phone to Dr. Daphene Jaeger and confirmed that patient is receiving Cisplatin. I spoke on the phone with Dr. Madaline Guthrie (Dr. Adele Schilder partner), who updated me that patient is currently undergoing curative intent RT with IMRT plan to 70 Gy. She far he's received 17/35 fractions, and his last fraction was on 10/06/20. Treatment team conveyed to me that he has had progression of disease while on treatment. ?On review of the records, it was noted that the area of disease progression clinically evident on examination was not within the prior radiotherapy field.  Given this, it was not felt that he likely had a complete non response or failure to radiotherapy.  Given his progression of disease in the mediastinum, he was unlikely to have curative treatment however in order to prevent progression and try to help preserve quality  of life aggressive palliative radiotherapy in short courses recommended to try to delay his disease with hopes that he may be a candidate for subsequent palliative chemotherapy.Patient indicated he did wish to proceed and was able to undergo [place] simulation.  Due to the prior receipt of 34 Gy at an outside institution, intensity modulated radiotherapy was planned.  A total of 24 Gy in 6 fractions was planned to the areas of radiation naive disease as well as to the necrotic tumor at the stoma, with an additional 18 Gy planned to areas within the prior radiation.SUMMARY OF RADIATION THERAPY:Radiation Oncology - Course: 2	Protocol:  Treatment Site Current Dose Modality From To Elapsed Days Fx. larynx/trachea  2,000 cGy X-ray  10/22/2020  10/28/2020   6  5 Larynx_[id number]    400 cGy +  10/21/2020  10/21/2020   1        A total of 24 Gy in 6 fractions was delivered to the larynx and upper mediastinum with 15 Gy in 5 fractions to the supraglottis and base of tongue.No systemic therapyTREATMENT COURSE:While the patient was able to tolerate [place] simulation with only benzodiazepines, unfortunately, he was not able to tolerate treatment without complete sedation.  Multiple attempts were made including use of Valium and Ativan however he was not able to tolerate.  On Wednesday, February 16, he had had a mucus plugging event and had been placed on the ventilator.  He was lucid and indicated he did wish to proceed with treatment, therefore in discussion with the team, it was discussed that we could use propofol since he is now ventilated to deliver treatment completely sedated and he was subsequently able to tolerate this.  His 1st fraction of radiotherapy was given by 3D methods to ensure he was able to receive treatment but after showing he was able to tolerate treatment ventilated on propofol, he was converted back to the originally intended IMRT plan for an additional 5 fractions.  He did show evidence of tumor response after just 3 fractions with some regression of the tumor at the stoma.  Unfortunately, he still required sedation for all of his treatments and will remain under the care of the ICU Team after completion of treatment.  At the conclusion of his treatment, given his clinical status, no further radiotherapy is planned however if his clinical status improves, further radiotherapy could be considered to the upper mediastinum are other sites if clinically indicated, but unfortunately, curative intent treatment is unlikely.*This note was composed using dictation software, and transcription errors may occur in the body of the text above.Jesyka Slaght Rasar Maple Hudson, MD/PhDAssistant ProfessorRadiation Oncology

## 2020-11-03 NOTE — Progress Notes
Inpatient Diabetes Management Team Follow-up Note    Over the past 24 hours, the patient's glucose control has been controlled on the insulin infusion    ? Current Nutritional Status:  Continuous tube feeding with Peptamen Intense @ 85 cc/hr  - Total provides: 2040 kcal (2829 kcal w/current propofol rate), 188 gm protein, 155 gm CHO (6.5 gm/hr), and 1714 mL free H2O    ? Current Anti-hyperglycemic Regimen:  Insulin infusion with rates at 5.0-6.5 units/hr    ?  Home Anti-hyperglycemic Regimen: Toujeo U300 64 units daily, Apidra 20 units TID with meals, metformin 1000 mg bid     ? Other Contributing Medications:   ? Levophed infusion  ? Propofol infusion    PE:    Patient laying in bed sedated on the ventilator  Temp:  [99.5 ?F (37.5 ?C)-100.76 ?F (38.2 ?C)] 99.86 ?F (37.7 ?C)  Pulse:  [83-96] 91  Resp:  [6-38] 16  BP: (104-157)/(49-71) 118/52  SpO2:  [95 %-100 %] 97 %  Device (Oxygen Therapy): mechanical ventilator     Wt Readings from Last 3 Encounters:   11/02/20 (!) 148 kg   10/06/20 (!) 139.5 kg   09/29/20 (!) 142.4 kg       Data Review:  BGs:      Creatinine (mg/dL)   Date Value   16/06/9603 2.04 (H)     Hemoglobin A1c (%)   Date Value   10/10/2020 9.0 (H)       Assessment:  55 yo h/o type 2 diabetes, HTN, Waldenstroms macroglobulinemia, ETOH, laryngeal SCC s/p trach/PEG presented with SOB significant for tumor invading the trach stoma with partial obstruction and he was started on palliative chemo radiation. Course complicated by multiple episodes of Bivona trach occulusion/obstruction. Patient currently admitted to the SICU for ventilatory support and on IV antibiotics for treatment of pneumonia with septic shock. Diabetes team consulted for assistance in glycemic management.      Yesterday patient's BG was persistently in the 200s and he was started on an insulin infusion. He remains on continuous tube feeding at goal rate. Patient also remains on levophed and propofol infusion. Per primary team they would like to continue patient on the insulin gtt today. However, advise starting glargine dosed twice a day to lower his insulin requirements on the gtt and it will also help with his ultimate transition off the gtt.   Plan discussed with covering provider L. Cusick, PA. Given patient on continuous tube feedings, target blood glucose between 140mg /dl-180mg /dL mg/dL.    Recommendations:   1. Continue the insulin infusion  2. Consider starting glargine 15 units BID  3. Blood glucose monitoring per protocol  4. Diet: tube feeds per nutrition and primary team      Please let us know if you have any questions or concerns about our recommenations. We will continue to follow the patient along with you during his hospitalzation as we attempt to optimize his glycemic control.  Detailed recommendations were communicated to the primary team both verbally and/or through this written note.  I spent a total of 20 minutes with the patient of which 15 minutes (over 50%) was spent in counseling the patient regarding diabetes management and coordinating care with the primary team.     Signed:  Everlene Farrier, APRN  Inpatient Diabetes Management Team   [facility]: [phone]  Diabetes consult pager # [phone]      NOTE: During nights, weekends, and holidays, please contact the on-call Endocrine Fellow at pager [phone].

## 2020-11-03 NOTE — Progress Notes
[place] [facility]	SICU Progress NoteAttending Provider: Alric Quan, MDPost-Operative Day/Post Injury Day Procedure(s):2/5: [name] exchange (ENT) Hospital LOS: 25 days Interim History:  HPI: 54 y.o. male admitted to the SICU on 2/9 for ventilator management following an RRT on the oncology floor for desaturation and increased work of breathing while on trach mask and ultimately placed back on vent. ?Patient was initially a transfer from [facility] on 2/4 after trach and PEG when he had a partial obstruction of trach site from a new necrotic mass. He underwent trach exchange 2/5. Was on trach mask on oncology floor however had 2 episodes on floor requiring RRT for lavage and likely mucous plugging. ?Course Complicated by: - Incidental R IJ thrombus - Multiple episodes of presumed mucous plugging requiring RRT's ?PMH: Chronic ITP, Waldenstrom macroglobulinemia, HTN. HLD, DM, tobacco and alcohol use, Laryngeal SCC (weekly Cisplatin and RT)?PSH: None known ?Home meds: Lipitor, Pepcid, Lisinopril, Metformin, Oxycodone, miralax, Insulin U100 and Lantus SICU course:2/9: Arrives to SICU HDS, on vent PCV. Appears well. Transitioned to Pressure support on vent and tolerating well. O/N: Episode of patient feeling like not getting a full breath, extremely tachypenic and increased work of breathing and tachycardia, attempted suctioning and lavage without improvement, attempted to switch to PCV and VAC but due to dyssynchrony unable to tolerate, no desat but poor ventilation and TV and ETCO2 up to 100. ENT at bedside do not feel additional scope would benefit as he was just scoped a few hours ago, ultimately gave fentanyl push and started propofol for vent synchrony but despite max prop still dyssynchronous. Gave one dose of roccuronium once sedated for vent synchrony with good effect. Albuterol and HTS neb given and peaks improved from 50 to 30's. No further paralytic needed and now synchronous on VAC. Kept on propofol for vent synchrony. CXR reassuring and back to GCS 11T. Hypotension only following sedation and Rocc, Low dose Neo started. 2/10: Kept on higher sedation in AM for [place] A/P (for staging) and soft tissue neck. Weaning sedation and will plan for PSV trial once more awake. Scoped by ENT in AM, airway patent. Q1-2hr suction requirements of thick tan secretions. No further episodes of respiratory distress although sedation remains high. Tube feeds changed to continuous given significant hyperglycemia. Insulin regimen changed per diabetes team recommendations. Pressure ulceration noted around tracheostomy --> ENT at bedside to evaluation, likely some component of invasive tumor growth. Palliative care consult for assist with symptom management and family moving forward. Primary oncologist office made aware of referral. O/N: weaning down on Prop, no episodes of respiratory distress or desats overnight. Febrile to 101.3, sputum and blood cultures ordered. 500cc LR bolus given for some oliguria and rising Cr. 2/11: Given 500cc NS for oliguria with good response. Vanco level yesterday 25, likely cause of worsening aki, on hold. UOP & lytes remains stable. Insulin adjusted per diabetes team. PSV as tolerated (doing well). CPT added. Confirmed with ENT pulm recs for blue portex suctionaid trach- no plans to switch at this time. Bowel regimen added. Plan keep on neo if low dose but if inc, consider changing to levo as likely then not in setting of sedation. Family updated on phone. Confirmed w heme- no AC rec for R IJ thrombus until plt > 50. Was going to attempt to place pt on TM overnight as he did very well on PSV x 3hrs, however, pt had anxiety attack regarding his cancer and prognosis, he requested to sleep during night and was agreeable to TM in  am. O/N: Patient agrees to PSV overnight which he did very well on. Agreeable to getting OOB to chair and possibly trach mask in AM but wants to stay on vent tonight. Off Neo by AM.  2/12: trial trach mask as tolerated this AM. Adjusted insulin regimen per the diabetes management service. Sputum now with staph, awaiting sensitivities. More loose stools today; nursing placed rectal tube and held bowel regimen.  ON: Anxious at times, feeling like he cannot breath but doing well on TM. Frequent suctioning. Increased standing. FIO2 increased to 40%.  insulin to 14U per endo reccs. 2/13: continue to adjust insulin regimen, now 20u regular q6. Cr continues to uptrend 2.57 --> 2.63 and repeat vanco level still elevated 24.6; pharmacy to continue dosing adjustments. Otherwise, remains on trach mask. Sputum with MRSA --> d/c ceftriaxone. ON: increased standing insulin to 24U. 2/14: Will discuss [facility] plan with hematology now that PLT > 50 x2 checks. Will also d/c vancomycin today (10d of therapy thus far). AKI appears to have peaked at 2.57 -> 2.63 -> 2.47. Awaiting transfer to the oncology service on NP 15. Okay to start [facility], but will hold off today given more bloody secretions. 2/15: Plan to start palliative RT today. AM session aborted due to extreme anxiety (despite premedication with IV ativan), tried again in the PM with 10mg  IV Valium + 3mg  IV ativan, procedure again aborted due to extreme anxiety and inability to tolerate. Pending discussion with rad onc in AM re: utility of ongoing attempts at RT. Pending transfer to NP15 on med onc service.  O/N: increasingly anxious / restless, but able to re-direct and calm down with PRN ativan. Liquid stools, placed rectal tube. Will start banatrol and check CBC in AM, check c dif. This had previously improved by holding bowel regimen.2/16: Acutely increased WOB, HR 130s, SBP 170s --> ENT bedside scope with trach patent, though notable secretions --> suctioned with minimal improvement; placed back on ventilator PSV 10 / +5 with immediate improvement in VS and respiratory distress. Precedex gtt started to assist more frequent suctioning. CXR with LLL consolidation --> resend sputum, and restart Vanco (close trough monitoring with AKI) given known MRSA PNA. Increased diarrhea, cdiff negative, transition to Vital TF. LR 500cc for hemoconcentration of labs, tachycardia. Appreciate radiation oncology; sedated patient with Propofol gtt, bolus x1 with good effect --> able to tolerate 15 minute radiation session; plan to repeat Thursday, Friday, Monday, and Tuesday. 2/17: Psych consulted for depression--> Doxepin added, Klonopin added. Hypernatremia--> D5W and free water flushes added. Ceftaz added for GNR in sputum. Plan for family discussion this PM (see separate note) and radiation this PM. Foley replaced for retention. ON: Tolerated PSV 15/5. Repeat Na 148.2/18: Plan for radiation this afternoon. Rectal tube removed.. Hgb 6.3--> transfused 1un PRBCs. Discussed care with sister and niece at bedside, remain full code and plan to continue radiation course through Tuesday (see family communication note).2/19: PSV weaned 15 --> 12/+5. Start Clonidine and discontinue Precedex gtt. Discontinue Gabapentin and Dilaudid IV, decrease Oxycodone to 2.5 / 5. Na improved 144, discontinue D5W; sugars should improve, maintain current regimen per DM team. GNR per micro lab not pseudomonas, klebsiella, or other enteric organism; would have speciated --> change Ceftaz to PO Cipro, to complete course 2/23 for one week. Cr again increased with Vanco level over 20 --> next dose held, monitor level closely. Hgb remains low despite 1U yesterday, 6.3 --> 7.0; start multivitamins and check with Heme. ON: Low grade fevers stable. Given ativan  0.5mg  in place of klonipin with good anxiolytic effect. Transfuse 1u PRBC (likely half unit) for Hgb 6.7.2/20: Acute respiratory distress this morning after turn, minimal secretions, did not improve with increased pressure or VAC --> bagged x5 mins with improvement, placed on PCV, IP 20 --> CXR pending, start Propofol, add back Ativan and Morphine PRN. Appreciate heme assistance with persistent anemia. Cr continues to rise following elevated vanco trough; hold any further doses, level will likely remain therapeutic to final date of course (2/22) --> unfortunately, patient did receive additional dose of Vanco 2g per pharmacy order this morning; discontinued placeholder, consider Doxycycline if Vanco level subtherapeutic prior to end date.ON: Remains on PCV. Remained on low dose levo 0.02 throughout night. Manual suctioning by RT overnight with thin tan secretions. AM labs notable for Hgb 9.1(6.7) after 1uPRBC transfusion yesterday. Worsening AKI Cr 2.51 (2.44).2/21: Patient alert and breathing comfortably this morning --> wean PEEP back to 5, consider retrial of PSV today pending radiation plans. Free water flushes discontinued, hypernatremia resolved. Worsening AKI from Vanco; since discontinued, monitor UOP. LR 250cc, wean off Levo gtt. Appreciate palliative care assistance after event yesterday, patient requesting further discussion. Lantus changed to 15U QHS, 10U QAM and bolus increased to 28U Q6.ON: Acute respiratory distress at change of shift, lost tidal volumes and desaturated to high 80s requiring bagging, recovered. Pt expressing that he cannot breathe, feels obstructed --> suctioned with 10Fr suction w/ resistance met at 20cm and minimal secretions. ENT at bedside for scope --> thick secretions noted but airway patent.. Noted that Bivona trach was retracted, advanced to proper position by ENT. Per ENT, tracheal tissue proximal to bivona tip very flimsy. Likely tissue collapsing against distal tip with trach retracted in poor position causing obstruction. Patient acutely distressed and agitated during event, propofol gtt restarted will plan to wean off tonight. Start decadron 8mg  q12 per onc recs for peritumoral inflammation. Hyperkalemic in AM to 5.7 --> EKG unchanged, insulin/dextrose/Ca2+ x 1. Transfuse 1u PRBC for hgb 6.9. 2/22: Keep on PCV today and continue propofol gtt. Start Insulin gtt and increase Lantus to 20u BID. No bowel movements --> discontinue banatrol. Plan for radiation today. Discussed transfer to MICU no beds available, will initiate transfer once a bed becomes available. Plan for multidisciplinary family meeting this week. ON: Rested on PCV, low dose prop. WBC count 12 (7) this am2/23: Plan for last palliative radiation session later today. RR decreased to 12. Given rising white count and increased in line suctioning requirement Sputum culture sent and CXR obtained. Will stop Cipro and start ceftriaxone. 1L LR bolus given for AKI, rising BUN as well as likely concentration on CBC. Plan to start weaning off prop after radiation and attempt PSV trials again. Family discussion with all teams Thursday or Friday. CPT added back given increased secretions attempted to add back HTS nebs however hospital is completely out per pharmacy discussion. Respiratory culture again growing GPC and GNR. ON: BG stable on ins gtt. NAE2/24: This AM, less responsive (GCS 10T) and BP down to 60/40s, but also worsening vent synchrony --> increased oxycodone and dilaudid followed by 1000 LR bolus with good effect and pan-cx given worsening leukocytosis. Started vanc / zosyn.  Later, self d/c bivona trach and desat to 60s; SICU attending at bedside and replaced bivona trach without difficulty and placed back on the ventilator.  ENT at bedside to scope and confirmed trach positioning. Will plan for family meeting tomorrow. ON: NAE2/25: GCS 11T, but still withdrawn / lethargic. Will  d/c PRN ativan to minimize sedation and continue to wean propofol as tolerated, though patient has been mentating on higher doses than this. Still low dose levo requiremements. Bivona trach dislodged ~4cm today, obturator placed and re-advanced without issue. Trial PSV 15/5, tolerating with Tv 400s. D/w ENT, will try to work on custom trach for him. Plan for family meeting this afternoon for goals of care discussions. Rested on [facility] settings after ~6 hours of 15/5 (work of breathing + tachypnea 40s). Decreased lantus to 15 BID per endocrine. In afternoon, became more agitated / tachy requiring additional PRN 1mg  ativan + 1mg  versed + dilaudid. See ACP note for family meeting discussion. O/N: NAE. 2/26: Attempt to wean propofol to 35, increased agitation and tachypnea --> increase Prop back to 40 and oxycodone given with improvement. Plan for PSV 15/5 x 4 hours today as tolerated. Increased Lantus to 20u BID. Discontinued Zosyn. Discussed with pharmacy switching Vanco to Clinda given worsening renal function --> recommend continuing Vanco as high risk of C.diff with Clinda, will recheck random vanco level tomorrow @ 0500. 1L NS given for rise in creatinine. O/N: Vanc trough 21.4. 2/27: Plan for PSV 15/5 x 6 hours as tolerated. 1L LR given for high insensible losses. Change Vanco to PO Doxy. Increase Lantus to 30u BID, d/c insulin gtt and start HDISS.  O/N: K 5.7 --> insulin/dextrose/Ca given. 2/28: Remains on Levo. K 5.7--> 5.9--> Insulin/ Dextrose/ Ca x given x2 and Lokelma x1. Hyperglycemic--> Lantus 30 daily, HDISS, Regular Insulin 9un Q6hours. Decreased IP to 18, tolerating with TV 400s-500s. Heme consulted for ongoing thrombocytopenia, low concern for HIT, no plan to treat ITP unless plt<30k or bleeding.  O/N: At shift change insulin Gtt restarted, Significant stool output after lokelma, K improving to 5.4, will trend. AM labs with Plt 35, HSQ held following heme reccs. 3/1: [name] noted to be dislodged, re-advanced and ENT consulted with no acute interventions. CXR showing trach in stable position. Decreased IP to 15. Will trial on PSV 15/5 x6hours BID. Afternoon plt stable at 34; will continue to hold HSQ. K stably elevated at 5.5; no acute intervention given insulin gtt and large rectal tube output. Levo weaned off. Review of Allergies/Meds/Hx: I have reviewed the patient's: allergies, past medical history, past surgical history, family history, social history, prior to admission medicationsObjective: Vitals:I have reviewed the patient's current vital signs as documented in the EMRTemp:  [99.5 ?F (37.5 ?C)-100.76 ?F (38.2 ?C)] 99.5 ?F (37.5 ?C)Pulse:  [82-96] 89Resp:  [6-38] 23BP: (102-135)/(49-71) 132/66SpO2:  [95 %-100 %] 98 %SpO2: 98 %Device (Oxygen Therapy): mechanical ventilatorI/O's:I have reviewed the patient's current I&O's as documented in the EMR.Gross Totals (Last 24 hours) at 11/03/2020 0558Last data filed at 11/03/2020 0500Intake 4530.04 ml Output 5025 ml Net -494.96 ml Physical ExamConstitutional:     General: Anxious and withdrawn gentleman appearing older than stated age. HENT:    Head: Normocephalic and atraumatic.    Mouth: Mucous membranes are moist.    Extraocular Movements: Extraocular movements intact.    Pupils: Pupils are equal, round, and reactive to light.    Comments: Tracheostomy in placeCardiovascular:    Rate and Rhythm: Regular rhythm. Regular rate   Heart sounds: No murmur heard. Pulmonary:    Breath sounds: No wheezing, rhonchi or rales.    Comments: Ventilated via trachAbdominal:    General: There is no distension.    Tenderness: There is no abdominal tenderness.    Comments: PEG tube in place with TFs runningGenitourinary:   Comments:  Foley in place draining clear, yellow urineMusculoskeletal:    Lower legs: Mild edema   Comments: Moves all extremities equally, to command.Skin:   General: Skin is warm and dry.    Capillary Refill: Capillary refill takes less than 2 seconds. Neurological:    General: No focal deficit present.    Mental Status: He is alert.    Cranial Nerves: No cranial nerve deficit.    Motor: No weakness. Labs: Refer to EMR. I have reviewed the patient's labs within the last 24 hours; see assessment and plan below for significant abnormals.Microbiology:Refer to EMR. I have reviewed all new results within the last 24 hours; see assessment and plan below.Diagnostics:Refer to EMR. I have reviewed all new results within the last 24 hours; see assessment and plan below.Chest ZO:XWRUE to EMR. I have reviewed all new results within the last 24 hours; see assessment and plan below.Abdominal AV:WUJWJ to EMR. I have reviewed all new results within the last 24 hours; see assessment and plan below.ECG/Tele Events: I have reviewed the patient's ECG and telemetry as resulted in the EMR.Assessment/Plan Neurologic: Analgesia/sedation - Tylenol 650 Q6 PRN - Oxycodone 10 / 15 Q4 PRN - Dilaudid 1 PRN breakthrough- Propofol gtt, wean as able  Anxiety- s/p clonidine taper (stopped early 2/2 hypotension)- Clonazepam 0.5mg  BID, consider d/c if mental status continues to be depressed- Doxepin 10mg  QHS- Ativan 0.5mg  IV Q6 PRN acute agitation- Appreciate palliative care and psych consults?ENT: L laryngeal SCC, invading tracheostomy: s/p trach exchange 10/10/20 and biopsy 10/12/20- Bivona trach to bypass obstruction 	- F/u with ENT re: custom Bivona trach- Unfortunately, there are no surgical / curative options- Palliative radiation (2/16, 2/17, 2/18, 2/21, 2/22, 2/23)	- Required PCV with full propofol sedation to tolerate?Cardiac: Septic shock- s/p Norepinephrine gtt(2/24- 3/1) to maintain MAP>65Hx of HTN / HLD- Hold home lisinopril, lasix, crestor given AKI?Respiratory:  Respiratory failure (circumferential tumor invasion of stoma, mucous plugging / trach occlusion)- PC/AC: IP 15, PEEP +5- PSV 15/5 x6 hours BID, weaning IP as tolerated- s/p Decadron 8mg  BID for tumor inflammation (2/22 -2/24) per med oncology- Albuterol Q6- CPT 4 times daily - Could consider mucomyst nebsGI: Diarrhea, resolved - PEG in place with TF: Vital @ 95cc/hr; while on propofol --> peptamen at 85cc/hr- Pepcid while vented?Renal: AKI on CKD (baseline Cr 1.5-1.6), 2/2 supratherapeutic vancomycin level, improving - Cr continues to rise, continue to trend daily- Foley replaced 2/17 for urinary retentionHyperkalemia:- medical management with Insulin/ Dextrose/ Calcium- trend lytes- Lokelma if neededInfectious Disease:  Concern for new pneumonia- 2/24 sputum: GNR- 2/23 sputum: MRSA- Vanco  (2/24- 2/27), de-escalate to Doxy (2/27 - 3/5) plan 10d course - S/p Zosyn (2/24- 2/26)Hx MRSA PNA; 4+ GNR PNA- s/p Vanco (2/5 - 2/14, 2/16 - 2/22)- s/p Ceftaz (2/17- 2/19)- s/p Cipro (2/19 - 2/23)?Hematology:Critical illness anemia- Sanguinous secretions, expected given tumor burden and coagulopathy, may worsen with radiation and baseline hematologic disease- Multivitamins and cofactors- Maintain Hgb >7?Hx of chronic ITP, Waldenstrom macroglobulinemia- Transfuse platelets if <30k, bleeding, or need for invasive procedure per heme- no need to treat ITP unless plts<30k- would hold Hep SC if plts<50k- Appreciate hematology recommendations?R IJ thrombus - High risk heparin gtt when plt consistently >50 and no more bloody secretions from tracheostomy- HSQ- Maintain SCDs at all times?Endocrine: Hx T2DM- Insulin Gtt restarted 2/28- Lantus 15 units BID- Hold Regular insulin 9un SC Q6hours- Hold HDISS- HOLD home Metformin, Lantus 64u, Apidra 20u TID w meals- Appreciate diabetes team recsMusculoskeletal: Generalized deconditioning- OOB daily, PT/OT evals- Turn and reposition Q2 hours,  PUP dressing?Device Review:- R IJ Port (08/12/20) per guidelines, nonocclusive DVT does not require removal of line- PEG (09/14/20)- Bivona trach (10/10/20)?Code Status / Dispo:- Transition to DNR (see ACP note 2/25)- Per ENT, patient to transfer to medical oncology after SICU- Appreciate palliative care assistance with GOC discussionsNotifications : For questions please call SICU Covering Provider in Epic EMR (primary) SICU MHB Dynamic Roles (secondary)YSC 7-1 APP			[phone] 6-1 Frontside		[phone] 6-1 Backside		[phone] Verdi-2 APP			[phone]:Bess Saltzman, PA-CSICU APP3/09/2020

## 2020-11-04 LAB — CBC WITH AUTO DIFFERENTIAL
BKR WAM ABSOLUTE IMMATURE GRANULOCYTES.: 0.68 x 1000/ÂµL — ABNORMAL HIGH (ref 0.00–0.30)
BKR WAM ABSOLUTE LYMPHOCYTE COUNT.: 0.3 x 1000/ÂµL — ABNORMAL LOW (ref 0.60–3.70)
BKR WAM ABSOLUTE NRBC (2 DEC): 0 x 1000/ÂµL (ref 0.00–1.00)
BKR WAM ANALYZER ANC: 6.72 x 1000/ÂµL (ref 2.00–7.60)
BKR WAM BASOPHIL ABSOLUTE COUNT.: 0.03 x 1000/ÂµL (ref 0.00–1.00)
BKR WAM BASOPHILS: 0.3 % (ref 0.0–1.4)
BKR WAM EOSINOPHIL ABSOLUTE COUNT.: 0.3 x 1000/ÂµL (ref 0.00–1.00)
BKR WAM EOSINOPHILS: 3.3 % (ref 0.0–5.0)
BKR WAM HEMATOCRIT (2 DEC): 26.3 % — ABNORMAL LOW (ref 38.50–50.00)
BKR WAM HEMOGLOBIN: 8 g/dL — ABNORMAL LOW (ref 13.2–17.1)
BKR WAM IMMATURE GRANULOCYTES: 7.6 % — ABNORMAL HIGH (ref 0.0–1.0)
BKR WAM LYMPHOCYTES: 3.3 % — ABNORMAL LOW (ref 17.0–50.0)
BKR WAM MCH (PG): 27 pg (ref 27.0–33.0)
BKR WAM MCHC: 30.4 g/dL — ABNORMAL LOW (ref 31.0–36.0)
BKR WAM MCV: 88.9 fL (ref 80.0–100.0)
BKR WAM MONOCYTE ABSOLUTE COUNT.: 0.95 x 1000/ÂµL (ref 0.00–1.00)
BKR WAM MONOCYTES: 10.6 % (ref 4.0–12.0)
BKR WAM MPV: 12.7 fL — ABNORMAL HIGH (ref 8.0–12.0)
BKR WAM NEUTROPHILS: 74.9 % — ABNORMAL HIGH (ref 39.0–72.0)
BKR WAM NUCLEATED RED BLOOD CELLS: 0 % (ref 0.0–1.0)
BKR WAM PLATELETS: 34 x1000/ÂµL — ABNORMAL LOW (ref 150–420)
BKR WAM RDW-CV: 17.5 % — ABNORMAL HIGH (ref 11.0–15.0)
BKR WAM RED BLOOD CELL COUNT.: 2.96 M/ÂµL — ABNORMAL LOW (ref 4.00–6.00)
BKR WAM WHITE BLOOD CELL COUNT: 9 x1000/ÂµL (ref 4.0–11.0)

## 2020-11-04 LAB — IMMATURE PLATELET FRACTION (BH GH LMW YH)
BKR WAM IPF, ABSOLUTE: 2.5 x1000/ÂµL (ref <20.0)
BKR WAM IPF: 7.3 % (ref 1.2–8.6)

## 2020-11-04 LAB — PHOSPHORUS     (BH GH L LMW YH): BKR PHOSPHORUS: 5.1 mg/dL — ABNORMAL HIGH (ref 2.2–4.5)

## 2020-11-04 LAB — BASIC METABOLIC PANEL
BKR ANION GAP: 11 (ref 7–17)
BKR BLOOD UREA NITROGEN: 69 mg/dL — ABNORMAL HIGH (ref 6–20)
BKR BUN / CREAT RATIO: 33 — ABNORMAL HIGH (ref 8.0–23.0)
BKR CALCIUM: 9.8 mg/dL (ref 8.8–10.2)
BKR CHLORIDE: 109 mmol/L — ABNORMAL HIGH (ref 98–107)
BKR CO2: 19 mmol/L — ABNORMAL LOW (ref 20–30)
BKR CREATININE: 2.09 mg/dL — ABNORMAL HIGH (ref 0.40–1.30)
BKR EGFR (AFR AMER): 40 mL/min/{1.73_m2} (ref >60)
BKR EGFR (NON AFRICAN AMERICAN): 33 mL/min/{1.73_m2} (ref >60)
BKR GLUCOSE: 118 mg/dL — ABNORMAL HIGH (ref 70–100)
BKR POTASSIUM: 5.3 mmol/L (ref 3.3–5.3)
BKR SODIUM: 139 mmol/L (ref 136–144)

## 2020-11-04 LAB — MAGNESIUM: BKR MAGNESIUM: 2 mg/dL (ref 1.7–2.4)

## 2020-11-04 MED ORDER — DEXMEDETOMIDINE 400 MCG/100 ML (4 MCG/ML) IN 0.9 % SODIUM CHLORIDE IV
4001004 mcg/100 mL (4 mcg/mL) | INTRAVENOUS | Status: DC
Start: 2020-11-04 — End: 2020-11-11
  Administered 2020-11-04 – 2020-11-11 (×33): 400 mL/h via INTRAVENOUS

## 2020-11-04 NOTE — Progress Notes
Otolaryngology Progress NoteDate: 11/04/2020 Interim History- NAEO- pending custom trachVitalsTemp:  [37.1 ?C-38.4 ?C] 37.3 ?CPulse:  [86-106] 93Resp:  [14-33] 32BP: (96-137)/(45-70) 123/68SpO2:  [96 %-99 %] 99 %Device (Oxygen Therapy): mechanical ventilator I/O last 3 completed shifts:In: 2705.7 [I.V.:665.7; NG/GT:2040]Out: 3715 [Urine:3715]Physical ExamGen: alert, vented PCEyes: open, normal external appearanceFace: grossly symmetricNose: clear anteriorlyOP/OC: hemostaticNeck: Bivona 8 cuffed secured w/soft trach ties, necrotic but hemostatic mass surrounding the stoma, remaining neck flat, firm tumor Pulm: no increased WOB on TMDiagnostic DataReviewed, per chartAssessment & Plan54 M with PMH of HTN, HLD, DM, tobacco and EtOH use, Waldenstrom macroglobulinemia, 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 Admitted to SICU for ventilatory support. No acute ENT intervention planned; no curative or meaningful surgical options at this time. Course complicated by multiple episodes of Bivona trach occlusion/obstruction.  - needs custom trach- no ENT intervention planned at this time; case discussed at tumor board 2/14 and is not a surgical candidate - disease management/goals-of-care best pursued via non-surgical means; eg, Medical Oncology, Radiation Oncology, Palliative Care- transfer to Medical Oncology floor when eligible - continue routine trach care with careful suctioning- ensure trach well secured with Karn Cassis, MD, PGY-1

## 2020-11-04 NOTE — Other
-    CONSULT  REQUEST  DOCUMENTATION  -  [facility] NOTE  -  Type of consult: [facility] Psychiatry   -  New Consult: [id number] Arthur [name] /Location: 7112/7112-A / Brief Clinical Question: Re-evaluate patient started on Doxepin/ Klonopin per psych with continued depression, now less interactive/Callback Cell Phone: SICU [phone] / Please confirm receipt of this message by texting back ?OK?  -  1 - Mobile Heartbeat message sent to [place], B at 9:51 AM. Received response at 09:57.  Trudee Grip IV, PCT  11/04/2020  9:49 AM  Consult [facility] [phone]

## 2020-11-04 NOTE — Plan of Care
Plan of Care Overview/ Patient Status    Pt is sedated on propofol gtt, localizing to painful stimulation, (+) CPOT for pain scale, PRN oxycodone given w/ good relief, VSS, remains off levo gtt, continue insulin gtt, gave Lantus 15 units last night, no episode of hypoglycemia noted, tolerated CPAP x 6 hours, rested on Pres ACV overnight, still has large thick/thin tan secretions from trache, continue TF, scant stool from  RT, large urine from [facility],

## 2020-11-04 NOTE — Progress Notes
Inpatient Diabetes Management Team Follow-up Note    Over the past 24 hours, the patient's glucose control has been controlled on the insulin infusion    ? Current Nutritional Status:  Continuous tube feeding with Peptamen Intense @ 85 cc/hr  - Total provides: 2040 kcal (2829 kcal w/current propofol rate), 188 gm protein, 155 gm CHO (6.5 gm/hr), and 1714 mL free H2O    ? Current Anti-hyperglycemic Regimen:  glargine 15 units q 12 hours, Insulin infusion per protocol     ?  Home Anti-hyperglycemic Regimen: Toujeo U300 64 units daily, Apidra 20 units TID with meals, metformin 1000 mg bid     ? Other Contributing Medications:   ? Levophed infusion  ? Propofol infusion    PE:    Patient laying in bed sedated on the ventilator  Temp:  [98.78 ?F (37.1 ?C)-101.12 ?F (38.4 ?C)] 99.68 ?F (37.6 ?C)  Pulse:  [86-106] 93  Resp:  [15-33] 23  BP: (96-137)/(45-70) 110/53  SpO2:  [96 %-99 %] 98 %  Device (Oxygen Therapy): mechanical ventilator     Wt Readings from Last 3 Encounters:   11/02/20 (!) 148 kg   10/06/20 (!) 139.5 kg   09/29/20 (!) 142.4 kg       Data Review:  BGs:      Creatinine (mg/dL)   Date Value   09/07/7251 2.09 (H)     Hemoglobin A1c (%)   Date Value   10/10/2020 9.0 (H)       Assessment:  55 yo h/o type 2 diabetes, HTN, Waldenstroms macroglobulinemia, ETOH, laryngeal SCC s/p trach/PEG presented with SOB significant for tumor invading the trach stoma with partial obstruction and he was started on palliative chemo radiation. Course complicated by multiple episodes of Bivona trach occulusion/obstruction. Patient currently admitted to the SICU for ventilatory support and on IV antibiotics for treatment of pneumonia with septic shock. Diabetes team consulted for assistance in glycemic management.      BG over the past 24 hours have been variable.  He remains on tube feeds at goal rate.  He was started on glargine BID and he remains on insulin infusion with rates now decreasing in the setting of glargine being added. Discussed with team to remain on insulin infusion today and reassess his insulin infusion requirements in the setting of starting glargine and plan to transition off the insulin infusion tomorrow. Discussed plan with the covering provider, Sharyne Richters, PA.  Given patient on continuous tube feedings, target blood glucose between 140mg /dl-180mg /dL mg/dL.    Recommendations:   1. Continue the insulin infusion per protocol  2. Continue glargine 15 units BID  3. Blood glucose monitoring per protocol  4. Diet: tube feeds per nutrition and primary team      Please let us know if you have any questions or concerns about our recommenations. We will continue to follow the patient along with you during his hospitalzation as we attempt to optimize his glycemic control.  Detailed recommendations were communicated to the primary team both verbally and/or through this written note.  I spent a total of 20 minutes with the patient of which 15 minutes (over 50%) was spent in counseling the patient regarding diabetes management and coordinating care with the primary team.     Signed:  Yevonne Aline, PA  Inpatient Diabetes Management Team   [facility]: [phone]  Diabetes consult pager # [phone]      NOTE: During nights, weekends, and holidays, please contact the on-call Endocrine Fellow at pager  [phone].

## 2020-11-04 NOTE — Progress Notes
[facility]	SICU Progress NoteAttending Provider: Alric Quan, MDPost-Operative Day/Post Injury Day Procedure(s):2/5: Arthur [name] exchange (ENT) Hospital LOS: 26 days Interim History:  HPI: 55 y.o. male admitted to the SICU on 2/9 for ventilator management following an RRT on the oncology floor for desaturation and increased work of breathing while on trach mask and ultimately placed back on vent. ?Patient was initially a transfer from [facility] on 2/4 after trach and PEG when he had a partial obstruction of trach site from a new necrotic mass. He underwent trach exchange 2/5. Was on trach mask on oncology floor however had 2 episodes on floor requiring RRT for lavage and likely mucous plugging. ?Course Complicated by: - Incidental R IJ thrombus - Multiple episodes of presumed mucous plugging requiring RRT's ?PMH: Chronic ITP, Waldenstrom macroglobulinemia, HTN. HLD, DM, tobacco and alcohol use, Laryngeal SCC (weekly Cisplatin and RT)?PSH: None known ?Home meds: Lipitor, Pepcid, Lisinopril, Metformin, Oxycodone, miralax, Insulin U100 and Lantus SICU course:2/9: Arrives to SICU HDS, on vent PCV. Appears well. Transitioned to Pressure support on vent and tolerating well. O/N: Episode of patient feeling like not getting a full breath, extremely tachypenic and increased work of breathing and tachycardia, attempted suctioning and lavage without improvement, attempted to switch to PCV and VAC but due to dyssynchrony unable to tolerate, no desat but poor ventilation and TV and ETCO2 up to 100. ENT at bedside do not feel additional scope would benefit as he was just scoped a few hours ago, ultimately gave fentanyl push and started propofol for vent synchrony but despite max prop still dyssynchronous. Gave one dose of roccuronium once sedated for vent synchrony with good effect. Albuterol and HTS neb given and peaks improved from 50 to 30's. No further paralytic needed and now synchronous on VAC. Kept on propofol for vent synchrony. CXR reassuring and back to GCS 11T. Hypotension only following sedation and Rocc, Low dose Neo started. 2/10: Kept on higher sedation in AM for Octa A/P (for staging) and soft tissue neck. Weaning sedation and will plan for PSV trial once more awake. Scoped by ENT in AM, airway patent. Q1-2hr suction requirements of thick tan secretions. No further episodes of respiratory distress although sedation remains high. Tube feeds changed to continuous given significant hyperglycemia. Insulin regimen changed per diabetes team recommendations. Pressure ulceration noted around tracheostomy --> ENT at bedside to evaluation, likely some component of invasive tumor growth. Palliative care consult for assist with symptom management and family moving forward. Primary oncologist office made aware of referral. O/N: weaning down on Prop, no episodes of respiratory distress or desats overnight. Febrile to 101.3, sputum and blood cultures ordered. 500cc LR bolus given for some oliguria and rising Cr. 2/11: Given 500cc NS for oliguria with good response. Vanco level yesterday 25, likely cause of worsening aki, on hold. UOP & lytes remains stable. Insulin adjusted per diabetes team. PSV as tolerated (doing well). CPT added. Confirmed with ENT pulm recs for blue portex suctionaid trach- no plans to switch at this time. Bowel regimen added. Plan keep on neo if low dose but if inc, consider changing to levo as likely then not in setting of sedation. Family updated on phone. Confirmed w heme- no AC rec for R IJ thrombus until plt > 50. Was going to attempt to place pt on TM overnight as he did very well on PSV x 3hrs, however, pt had anxiety attack regarding his cancer and prognosis, he requested to sleep during night and was agreeable to TM in  am. O/N: Patient agrees to PSV overnight which he did very well on. Agreeable to getting OOB to chair and possibly trach mask in AM but wants to stay on vent tonight. Off Neo by AM.  2/12: trial trach mask as tolerated this AM. Adjusted insulin regimen per the diabetes management service. Sputum now with staph, awaiting sensitivities. More loose stools today; nursing placed rectal tube and held bowel regimen.  ON: Anxious at times, feeling like he cannot breath but doing well on TM. Frequent suctioning. Increased standing. FIO2 increased to 40%.  insulin to 14U per endo reccs. 2/13: continue to adjust insulin regimen, now 20u regular q6. Cr continues to uptrend 2.57 --> 2.63 and repeat vanco level still elevated 24.6; pharmacy to continue dosing adjustments. Otherwise, remains on trach mask. Sputum with MRSA --> d/c ceftriaxone. ON: increased standing insulin to 24U. 2/14: Will discuss [facility] plan with hematology now that PLT > 50 x2 checks. Will also d/c vancomycin today (10d of therapy thus far). AKI appears to have peaked at 2.57 -> 2.63 -> 2.47. Awaiting transfer to the oncology service on NP 15. Okay to start [facility], but will hold off today given more bloody secretions. 2/15: Plan to start palliative RT today. AM session aborted due to extreme anxiety (despite premedication with IV ativan), tried again in the PM with 10mg  IV Valium + 3mg  IV ativan, procedure again aborted due to extreme anxiety and inability to tolerate. Pending discussion with rad onc in AM re: utility of ongoing attempts at RT. Pending transfer to NP15 on med onc service.  O/N: increasingly anxious / restless, but able to re-direct and calm down with PRN ativan. Liquid stools, placed rectal tube. Will start banatrol and check CBC in AM, check c dif. This had previously improved by holding bowel regimen.2/16: Acutely increased WOB, HR 130s, SBP 170s --> ENT bedside scope with trach patent, though notable secretions --> suctioned with minimal improvement; placed back on ventilator PSV 10 / +5 with immediate improvement in VS and respiratory distress. Precedex gtt started to assist more frequent suctioning. CXR with LLL consolidation --> resend sputum, and restart Vanco (close trough monitoring with AKI) given known MRSA PNA. Increased diarrhea, cdiff negative, transition to Vital TF. LR 500cc for hemoconcentration of labs, tachycardia. Appreciate radiation oncology; sedated patient with Propofol gtt, bolus x1 with good effect --> able to tolerate 15 minute radiation session; plan to repeat Thursday, Friday, Monday, and Tuesday. 2/17: Psych consulted for depression--> Doxepin added, Klonopin added. Hypernatremia--> D5W and free water flushes added. Ceftaz added for GNR in sputum. Plan for family discussion this PM (see separate note) and radiation this PM. Foley replaced for retention. ON: Tolerated PSV 15/5. Repeat Na 148.2/18: Plan for radiation this afternoon. Rectal tube removed.. Hgb 6.3--> transfused 1un PRBCs. Discussed care with sister and niece at bedside, remain full code and plan to continue radiation course through Tuesday (see family communication note).2/19: PSV weaned 15 --> 12/+5. Start Clonidine and discontinue Precedex gtt. Discontinue Gabapentin and Dilaudid IV, decrease Oxycodone to 2.5 / 5. Na improved 144, discontinue D5W; sugars should improve, maintain current regimen per DM team. GNR per micro lab not pseudomonas, klebsiella, or other enteric organism; would have speciated --> change Ceftaz to PO Cipro, to complete course 2/23 for one week. Cr again increased with Vanco level over 20 --> next dose held, monitor level closely. Hgb remains low despite 1U yesterday, 6.3 --> 7.0; start multivitamins and check with Heme. ON: Low grade fevers stable. Given ativan  0.5mg  in place of klonipin with good anxiolytic effect. Transfuse 1u PRBC (likely half unit) for Hgb 6.7.2/20: Acute respiratory distress this morning after turn, minimal secretions, did not improve with increased pressure or VAC --> bagged x5 mins with improvement, placed on PCV, IP 20 --> CXR pending, start Propofol, add back Ativan and Morphine PRN. Appreciate heme assistance with persistent anemia. Cr continues to rise following elevated vanco trough; hold any further doses, level will likely remain therapeutic to final date of course (2/22) --> unfortunately, patient did receive additional dose of Vanco 2g per pharmacy order this morning; discontinued placeholder, consider Doxycycline if Vanco level subtherapeutic prior to end date.ON: Remains on PCV. Remained on low dose levo 0.02 throughout night. Manual suctioning by RT overnight with thin tan secretions. AM labs notable for Hgb 9.1(6.7) after 1uPRBC transfusion yesterday. Worsening AKI Cr 2.51 (2.44).2/21: Patient alert and breathing comfortably this morning --> wean PEEP back to 5, consider retrial of PSV today pending radiation plans. Free water flushes discontinued, hypernatremia resolved. Worsening AKI from Vanco; since discontinued, monitor UOP. LR 250cc, wean off Levo gtt. Appreciate palliative care assistance after event yesterday, patient requesting further discussion. Lantus changed to 15U QHS, 10U QAM and bolus increased to 28U Q6.ON: Acute respiratory distress at change of shift, lost tidal volumes and desaturated to high 80s requiring bagging, recovered. Pt expressing that he cannot breathe, feels obstructed --> suctioned with 10Fr suction w/ resistance met at 20cm and minimal secretions. ENT at bedside for scope --> thick secretions noted but airway patent.. Noted that Bivona trach was retracted, advanced to proper position by ENT. Per ENT, tracheal tissue proximal to bivona tip very flimsy. Likely tissue collapsing against distal tip with trach retracted in poor position causing obstruction. Patient acutely distressed and agitated during event, propofol gtt restarted will plan to wean off tonight. Start decadron 8mg  q12 per onc recs for peritumoral inflammation. Hyperkalemic in AM to 5.7 --> EKG unchanged, insulin/dextrose/Ca2+ x 1. Transfuse 1u PRBC for hgb 6.9. 2/22: Keep on PCV today and continue propofol gtt. Start Insulin gtt and increase Lantus to 20u BID. No bowel movements --> discontinue banatrol. Plan for radiation today. Discussed transfer to MICU no beds available, will initiate transfer once a bed becomes available. Plan for multidisciplinary family meeting this week. ON: Rested on PCV, low dose prop. WBC count 12 (7) this am2/23: Plan for last palliative radiation session later today. RR decreased to 12. Given rising white count and increased in line suctioning requirement Sputum culture sent and CXR obtained. Will stop Cipro and start ceftriaxone. 1L LR bolus given for AKI, rising BUN as well as likely concentration on CBC. Plan to start weaning off prop after radiation and attempt PSV trials again. Family discussion with all teams Thursday or Friday. CPT added back given increased secretions attempted to add back HTS nebs however hospital is completely out per pharmacy discussion. Respiratory culture again growing GPC and GNR. ON: BG stable on ins gtt. NAE2/24: This AM, less responsive (GCS 10T) and BP down to 60/40s, but also worsening vent synchrony --> increased oxycodone and dilaudid followed by 1000 LR bolus with good effect and pan-cx given worsening leukocytosis. Started vanc / zosyn.  Later, self d/c bivona trach and desat to 60s; SICU attending at bedside and replaced bivona trach without difficulty and placed back on the ventilator.  ENT at bedside to scope and confirmed trach positioning. Will plan for family meeting tomorrow. ON: NAE2/25: GCS 11T, but still withdrawn / lethargic. Will  d/c PRN ativan to minimize sedation and continue to wean propofol as tolerated, though patient has been mentating on higher doses than this. Still low dose levo requiremements. Bivona trach dislodged ~4cm today, obturator placed and re-advanced without issue. Trial PSV 15/5, tolerating with Tv 400s. D/w ENT, will try to work on custom trach for him. Plan for family meeting this afternoon for goals of care discussions. Rested on [facility] settings after ~6 hours of 15/5 (work of breathing + tachypnea 40s). Decreased lantus to 15 BID per endocrine. In afternoon, became more agitated / tachy requiring additional PRN 1mg  ativan + 1mg  versed + dilaudid. See ACP note for family meeting discussion. O/N: NAE. 2/26: Attempt to wean propofol to 35, increased agitation and tachypnea --> increase Prop back to 40 and oxycodone given with improvement. Plan for PSV 15/5 x 4 hours today as tolerated. Increased Lantus to 20u BID. Discontinued Zosyn. Discussed with pharmacy switching Vanco to Clinda given worsening renal function --> recommend continuing Vanco as high risk of C.diff with Clinda, will recheck random vanco level tomorrow @ 0500. 1L NS given for rise in creatinine. O/N: Vanc trough 21.4. 2/27: Plan for PSV 15/5 x 6 hours as tolerated. 1L LR given for high insensible losses. Change Vanco to PO Doxy. Increase Lantus to 30u BID, d/c insulin gtt and start HDISS.  O/N: K 5.7 --> insulin/dextrose/Ca given. 2/28: Remains on Levo. K 5.7--> 5.9--> Insulin/ Dextrose/ Ca x given x2 and Lokelma x1. Hyperglycemic--> Lantus 30 daily, HDISS, Regular Insulin 9un Q6hours. Decreased IP to 18, tolerating with TV 400s-500s. Heme consulted for ongoing thrombocytopenia, low concern for HIT, no plan to treat ITP unless plt<30k or bleeding.  O/N: At shift change insulin Gtt restarted, Significant stool output after lokelma, K improving to 5.4, will trend. AM labs with Plt 35, HSQ held following heme reccs. 3/1: Arthur [name] noted to be dislodged, re-advanced and ENT consulted with no acute interventions. CXR showing trach in stable position. Decreased IP to 15. Will trial on PSV 15/5 x6hours BID. Afternoon plt stable at 34; will continue to hold HSQ. K stably elevated at 5.5; no acute intervention given insulin gtt and large rectal tube output. Levo weaned off. O/N: Rested on Pressure control after 6 hours PSV. NAE. Repeat plt stable at 34. 3/2: Weaning prop and transitioning to dex to decrease sedation and aid in anxiety/agitation management. Will trial on PSV 10/5 x6hr BID today, resting on PCV. Psych re-engaged due to withdrawn affect/mood this AM; d/ced doxepin to avoid further delirium/AMS. Acutely tachycardic, tachypneaic, and hypoxic w/ O2 sat in 80's on PSV trial --> prop gtt increased and placed back on PCV with IP 12. Weaned off prop, on dex gtt. Review of Allergies/Meds/Hx: I have reviewed the patient's: allergies, past medical history, past surgical history, family history, social history, prior to admission medicationsObjective: Vitals:I have reviewed the patient's current vital signs as documented in the EMRTemp:  [98.78 ?F (37.1 ?C)-101.12 ?F (38.4 ?C)] 98.96 ?F (37.2 ?C)Pulse:  [86-106] 89Resp:  [10-33] 26BP: (96-137)/(45-70) 106/49SpO2:  [95 %-100 %] 98 %SpO2: 98 %Device (Oxygen Therapy): mechanical ventilatorI/O's:I have reviewed the patient's current I&O's as documented in the EMR.Gross Totals (Last 24 hours) at 11/04/2020 [id number] data filed at 11/04/2020 0600Intake 2706 ml Output 3765 ml Net -1059 ml Physical ExamConstitutional:     General: Anxious and withdrawn gentleman appearing older than stated age. HENT:    Head: Normocephalic and atraumatic.    Mouth: Mucous membranes are moist.    Extraocular Movements: Extraocular movements  intact.    Pupils: Pupils are equal, round, and reactive to light.    Comments: Tracheostomy in placeCardiovascular:    Rate and Rhythm: Regular rhythm. Regular rate   Heart sounds: No murmur heard. Pulmonary:    Breath sounds: No wheezing, rhonchi or rales.    Comments: Ventilated via trachAbdominal:    General: There is no distension.    Tenderness: There is no abdominal tenderness.    Comments: PEG tube in place with TFs runningGenitourinary:   Comments: Foley in place draining clear, yellow urineMusculoskeletal:    Lower legs: Mild edema   Comments: Moves all extremities equally, to command.Skin:   General: Skin is warm and dry.    Capillary Refill: Capillary refill takes less than 2 seconds. Neurological:    General: No focal deficit present.    Mental Status: He is alert.    Cranial Nerves: No cranial nerve deficit.    Motor: No weakness. Labs: Refer to EMR. I have reviewed the patient's labs within the last 24 hours; see assessment and plan below for significant abnormals.Microbiology:Refer to EMR. I have reviewed all new results within the last 24 hours; see assessment and plan below.Diagnostics:Refer to EMR. I have reviewed all new results within the last 24 hours; see assessment and plan below.Chest XB:JYNWG to EMR. I have reviewed all new results within the last 24 hours; see assessment and plan below.Abdominal NF:AOZHY to EMR. I have reviewed all new results within the last 24 hours; see assessment and plan below.ECG/Tele Events: I have reviewed the patient's ECG and telemetry as resulted in the EMR.Assessment/Plan Neurologic: Analgesia/sedation - Tylenol 650 Q6 PRN - Oxycodone 10 / 15 Q4 PRN - Dilaudid 1 PRN breakthrough- s/p prop gtt- Precedex gtt started on 3/2 to aid in prop weanAnxiety/depression- s/p clonidine taper (stopped early 2/2 hypotension)- Clonazepam 0.5mg  BID, consider d/c if mental status continues to be depressed- d/c Doxepin 10mg  QHS due to concern for AMS/delirum on 3/2- Ativan 0.5mg  IV Q6 PRN acute agitation- Appreciate palliative care and psych consults?ENT: L laryngeal SCC, invading tracheostomy: s/p trach exchange 10/10/20 and biopsy 10/12/20- Bivona trach to bypass obstruction 	- F/u with ENT re: custom Bivona trach- Unfortunately, there are no surgical / curative options- Palliative radiation (2/16, 2/17, 2/18, 2/21, 2/22, 2/23)	- Required PCV with full propofol sedation to tolerate?Cardiac: Septic shock- s/p Norepinephrine gtt(2/24- 3/1) to maintain MAP>65Hx of HTN / HLD- Hold home lisinopril, lasix, crestor given AKI?Respiratory:  Respiratory failure (circumferential tumor invasion of stoma, mucous plugging / trach occlusion)- PC/AC: IP 12, PEEP +5- PSV 10/5 x6 hours BID, weaning IP as tolerated- s/p Decadron 8mg  BID for tumor inflammation (2/22 -2/24) per med oncology- Albuterol Q6- CPT 4 times daily - Could consider mucomyst nebsGI: Diarrhea, resolved - PEG in place with TF: Vital @ 95cc/hr; while on propofol --> peptamen at 85cc/hr- Pepcid while vented?Renal: AKI on CKD (baseline Cr 1.5-1.6), 2/2 supratherapeutic vancomycin level, improving - Cr continues to rise, continue to trend daily- Foley replaced 2/17 for urinary retentionHyperkalemia:- medical management with Insulin/ Dextrose/ Calcium- trend lytes- Lokelma if neededInfectious Disease:  Concern for new pneumonia- 2/24 sputum: GNR- 2/23 sputum: MRSA- Vanco  (2/24- 2/27), de-escalate to Doxy (2/27 - 3/5) plan 10d course - S/p Zosyn (2/24- 2/26)Hx MRSA PNA; 4+ GNR PNA- s/p Vanco (2/5 - 2/14, 2/16 - 2/22)- s/p Ceftaz (2/17- 2/19)- s/p Cipro (2/19 - 2/23)?Hematology:Critical illness anemia- Sanguinous secretions, expected given tumor burden and coagulopathy, may worsen with radiation and baseline hematologic disease- Multivitamins and cofactors- Maintain Hgb >7?Hx  of chronic ITP, Waldenstrom macroglobulinemia- Transfuse platelets if <30k, bleeding, or need for invasive procedure per heme- no need to treat ITP unless plts<30k- would hold Hep SC if plts<50k- Appreciate hematology recommendations?R IJ thrombus - High risk heparin gtt when plt consistently >50 and no more bloody secretions from tracheostomy- HSQ- Maintain SCDs at all times?Endocrine: Hx T2DM- Insulin Gtt restarted 2/28- Lantus 15 units BID- Hold Regular insulin 9un SC Q6hours- Hold HDISS- HOLD home Metformin, Lantus 64u, Apidra 20u TID w meals- Appreciate diabetes team recsMusculoskeletal: Generalized deconditioning- OOB daily, PT/OT evals- Turn and reposition Q2 hours, PUP dressing?Device Review:- R IJ Port (08/12/20) per guidelines, nonocclusive DVT does not require removal of line- PEG (09/14/20)- Bivona trach (10/10/20)?Code Status / Dispo:- Transition to DNR (see ACP note 2/25)- Per ENT, patient to transfer to medical oncology after SICU- Appreciate palliative care assistance with GOC discussionsNotifications : For questions please call SICU Covering Provider in Epic EMR (primary) SICU MHB Dynamic Roles (secondary)YSC 7-1 APP			[phone] 6-1 Frontside		[phone] 6-1 Backside		[phone] Verdi-2 APP			[phone]:Paizley Ramella, PA-CSICU APP3/10/2020

## 2020-11-04 NOTE — Other
[place] [facility] SICUPsychiatry Consult Service Progress Note3/2/2022Patient Name:  Arthur RaleighMRN:  [id number] Patient Class:  InpatientSUBJECTIVE Interim History:  Psychiatry re-consulted for management of Arthur [name] mood in the setting of increased crying and withdrawal in the ICU. He was last see by Geoffery Spruce on 2/25, and recommendation at that time was to discontinue clonazepam in the setting of persistent altered mental status in the context of recurrent pneumonia. In speaking with RN and primary team, a week ago he was more alert and following commands and was crying a lot. Since then, has been more withdrawn, crying less. Team concerned that this is due to worsening depression/catatonia. At this time he is currently sedated on propofol, with plan to transition to dexmedetomidine. Subjective: On interview, he is in bed with eyes closed. +Trach/vent. When I start talking to him, he starts coughing significantly and needs to be suctioned. Unable to follow commands to open eyes or squeeze my hand. Review of Systems Unable to perform ROS: Mental status change OBJECTIVE I have reviewed the patient's current medications, allergies, past medical history, past surgical history, family history and social historyCurrent MedicationsCurrent Facility-Administered Medications Medication Dose Route Frequency Last Rate ? acetaminophen  650 mg Per G Tube Q6H PRN   ? albuterol  2.5 mg Nebulization Q6H WA   ? ascorbic acid  250 mg Per G Tube Daily   ? chlorhexidine gluconate  15 mL Mouth/Throat Q12H   ? clonazePAM  0.5 mg Per J Tube BID   ? dexmedetomidine  0.2-0.7 mcg/kg/hr (Dosing Weight) Intravenous Continuous 0.3 mcg/kg/hr (11/04/20 1400) ? insulin regular human 100 units in sodium chloride 0.9 % 100 mL   Intravenous Continuous 2.5 mL/hr at 11/04/20 1400  And ? dextrose injection  25 g Intravenous Q15 MIN PRN    And ? dextrose injection  12.5 g Intravenous Q15 MIN PRN   ? doxepin  10 mg Per J Tube Nightly   ? doxycycline hyclate  100 mg Per G Tube Q12H   ? famotidine  20 mg Per J Tube Daily   ? folic acid  1 mg Per G Tube Daily   ? glucagon  1 mg Intramuscular Once PRN   ? [Held by provider] heparin (porcine)  7,500 Units Subcutaneous Q8H   ? HYDROmorphone  1 mg IV Push Q3H PRN   ? insulin glargine  15 Units Subcutaneous Q12H   ? [Held by provider] insulin glargine  30 Units Subcutaneous Daily   ? [Held by provider] insulin regular human  9 Units Subcutaneous Q6H   ? LORazepam  0.5 mg IV Push Q6H PRN   ? miconazole nitrate   Topical (Top) BID   ? midazolam (PF)       ? oxyCODONE  10 mg Per G Tube Q4H PRN    Or ? oxyCODONE  15 mg Per G Tube Q4H PRN   ? propofol (DIPRIVAN) 1,000 mg in 100 mL (10 mg/mL)  5-80 mcg/kg/min Intravenous Continuous 20 mcg/kg/min (11/04/20 1400) ? thiamine  50 mg Per G Tube Daily   ? zinc oxide  1 Application Topical (Top) DAILY PRN   Vital SignsTemp: (!) 100.22 ?F (37.9 ?C)Pulse: (!) 112Resp: (!) 37BP: (!) 112/58SpO2: 95 %Mental Status ExamGeneral AppearanceHabitus was heavy. Height was average. Musculoskeletalhad no muscular rigidity, no cogwheeling. Psychiatric ExaminationPsychomotor Behavior: psychomotor slowing.Speech: [place]: was mute.Affect: disorientedThought Process: unable to assessAssociations: unable to assessThought Content: unable to assessSuicidal Ideation: unable to assessViolent Ideation: unable to assessInsight: poor Judgment: poor Lab Results (last 24  hours)Recent Results (from the past 24 hour(s)) POC Glucose (Fingerstick)  Collection Time: 11/03/20  3:18 PM Result Value Ref Range  Glucose, Meter 123 (H) 70 - 100 mg/dL POC Glucose (Fingerstick)  Collection Time: 11/03/20  5:08 PM Result Value Ref Range  Glucose, Meter 121 (H) 70 - 100 mg/dL POC Glucose (Fingerstick)  Collection Time: 11/03/20  7:16 PM Result Value Ref Range  Glucose, Meter 101 (H) 70 - 100 mg/dL POC Glucose (Fingerstick)  Collection Time: 11/03/20  8:16 PM Result Value Ref Range  Glucose, Meter 107 (H) 70 - 100 mg/dL POC Glucose (Fingerstick)  Collection Time: 11/03/20  9:22 PM Result Value Ref Range  Glucose, Meter 124 (H) 70 - 100 mg/dL POC Glucose (Fingerstick)  Collection Time: 11/03/20 10:15 PM Result Value Ref Range  Glucose, Meter 117 (H) 70 - 100 mg/dL POC Glucose (Fingerstick)  Collection Time: 11/03/20 11:11 PM Result Value Ref Range  Glucose, Meter 140 (H) 70 - 100 mg/dL POC Glucose (Fingerstick)  Collection Time: 11/04/20 12:03 AM Result Value Ref Range  Glucose, Meter 158 (H) 70 - 100 mg/dL POC Glucose (Fingerstick)  Collection Time: 11/04/20 12:55 AM Result Value Ref Range  Glucose, Meter 144 (H) 70 - 100 mg/dL POC Glucose (Fingerstick)  Collection Time: 11/04/20  2:02 AM Result Value Ref Range  Glucose, Meter 133 (H) 70 - 100 mg/dL POC Glucose (Fingerstick)  Collection Time: 11/04/20  4:09 AM Result Value Ref Range  Glucose, Meter 116 (H) 70 - 100 mg/dL POC Glucose (Fingerstick)  Collection Time: 11/04/20  5:06 AM Result Value Ref Range  Glucose, Meter 107 (H) 70 - 100 mg/dL Magnesium  Collection Time: 11/04/20  5:16 AM Result Value Ref Range  Magnesium 2.0 1.7 - 2.4 mg/dL Phosphorus     (BH GH L LMW YH)  Collection Time: 11/04/20  5:16 AM Result Value Ref Range  Phosphorus 5.1 (H) 2.2 - 4.5 mg/dL Basic metabolic panel  Collection Time: 11/04/20  5:16 AM Result Value Ref Range  Sodium 139 136 - 144 mmol/L  Potassium 5.3 3.3 - 5.3 mmol/L  Chloride 109 (H) 98 - 107 mmol/L  CO2 19 (L) 20 - 30 mmol/L  Anion Gap 11 7 - 17  Glucose 118 (H) 70 - 100 mg/dL  BUN 69 (H) 6 - 20 mg/dL  Creatinine 1.61 (H) 0.96 - 1.30 mg/dL  Calcium 9.8 8.8 - 04.5 mg/dL  BUN/Creatinine Ratio 40.9 (H) 8.0 - 23.0  eGFR (Afr Amer) 40 >60 mL/min/1.41m2  eGFR (NON African-American) 33 >60 mL/min/1.82m2 CBC auto differential  Collection Time: 11/04/20  5:16 AM Result Value Ref Range  WBC 9.0 4.0 - 11.0 x1000/?L  RBC 2.96 (L) 4.00 - 6.00 M/?L  Hemoglobin 8.0 (L) 13.2 - 17.1 g/dL  Hematocrit 81.19 (L) 14.78 - 50.00 %  MCV 88.9 80.0 - 100.0 fL  MCH 27.0 27.0 - 33.0 pg  MCHC 30.4 (L) 31.0 - 36.0 g/dL  RDW-CV 29.5 (H) 62.1 - 15.0 %  Platelets 34 (L) 150 - 420 x1000/?L  MPV 12.7 (H) 8.0 - 12.0 fL  Neutrophils 74.9 (H) 39.0 - 72.0 %  Lymphocytes 3.3 (L) 17.0 - 50.0 %  Monocytes 10.6 4.0 - 12.0 %  Eosinophils 3.3 0.0 - 5.0 %  Basophil 0.3 0.0 - 1.4 %  Immature Granulocytes 7.6 (H) 0.0 - 1.0 %  nRBC 0.0 0.0 - 1.0 %  ANC(Abs Neutrophil Count) 6.72 2.00 - 7.60 x 1000/?L  Absolute Lymphocyte Count 0.30 (L) 0.60 - 3.70 x 1000/?L  Monocyte Absolute Count  0.95 0.00 - 1.00 x 1000/?L  Eosinophil Absolute Count 0.30 0.00 - 1.00 x 1000/?L  Basophil Absolute Count 0.03 0.00 - 1.00 x 1000/?L  Absolute Immature Granulocyte Count 0.68 (H) 0.00 - 0.30 x 1000/?L  Absolute nRBC 0.00 0.00 - 1.00 x 1000/?L Immature Platelet Fraction [facility])  Collection Time: 11/04/20  5:16 AM Result Value Ref Range  Immature Platelet Fraction 7.3 1.2 - 8.6 %  Absolute Immature Platelet Fraction 2.5 <20.0 x1000/?L POC Glucose (Fingerstick)  Collection Time: 11/04/20  6:14 AM Result Value Ref Range  Glucose, Meter 110 (H) 70 - 100 mg/dL POC Glucose (Fingerstick)  Collection Time: 11/04/20  7:02 AM Result Value Ref Range  Glucose, Meter 120 (H) 70 - 100 mg/dL POC Glucose (Fingerstick)  Collection Time: 11/04/20  9:08 AM Result Value Ref Range  Glucose, Meter 134 (H) 70 - 100 mg/dL POC Glucose (Fingerstick)  Collection Time: 11/04/20 11:08 AM Result Value Ref Range  Glucose, Meter 157 (H) 70 - 100 mg/dL POC Glucose (Fingerstick)  Collection Time: 11/04/20  1:16 PM Result Value Ref Range  Glucose, Meter 178 (H) 70 - 100 mg/dL POC Glucose (Fingerstick)  Collection Time: 11/04/20  2:11 PM Result Value Ref Range  Glucose, Meter 177 (H) 70 - 100 mg/dL Assessment Arthur [name] is a 55 yo man w/ PMHx SCC of the larynx, HTN, HLD, DM2 who presented to [facility] as a transfer from OSH for 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 At that time, he was initially started on doxepin 10mg  for sleep and clonazepam 0.5mg  BID for panic. Patient continues to have persistent altered mental status in the context of aspiration pneumonia. Remains on propofol for sedation, which minimizes the utility of psychiatric assessment today, though there are plans to taper him to Precedex in the coming days which may allow for better evaluation (and may also help with anxiolysis)Team concerned about worsening depression given periods of crying as well as him being increasingly withdrawn. Understandably, periods of crying are highly distressing to everyone in his care. Given his current level of sedation on propofol, cannot evaluate for worsening depression or catatonia at this time (though it is of note he does not have any rigidity concerning for catatonia on exam, though propofol may obscure this). Crying may also be a sign of delirium, in which case starting antidepressants would not be useful.He has been on clonazepam for panic disorder and doxepin for sleep but in the context of persistent altered mental status we can scale back on these medications, as they may be perpetuating delirium.Psychiatric DiagnosisPanic disorderActive Hospital Problems  Diagnosis ? Principal Problem/Diagnosis:  Laryngeal cancer (HC Code) (HC CODE) [C32.9] ? Acute hypoxemic respiratory failure (HC Code) (HC CODE) [J96.01] ? Difficult airway [T88.4XXA] ? 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.3] No data recordedSAFETY AND RISK ASSESSMENT PSY RISK ASSESSMENT SAFE-T WITH C-SSRS  Reason for Assessment:  Consultation on Medical/Surgical PatientC-SSRS: Suicidal Ideation:  Since Last Assessment  WISH TO BE DEAD:  No  SUICIDAL THOUGHTS:  NoRisk to Self - Self-Injurious Behavior:   Current Urges to harm Self:  No  Recent Self-Injury:  No  History of Self Injury:  No  Imminent Risk for Self Injury in Facility:  LowRisk to Others:   Current Agitation:  No  Homicidal/Aggressive Ideation:  No  Homicidal/Aggressive Threat/Plan:  No  Recent Violence/Aggression:  No  Imminent  Risk for Violence in Facility:  LowWithdrawal Risk:      Alcohol/Benzodiazepine/Barbiturate Risk for Withdrawal:  Low     Opioid Risk for Withdrawal:  LowPLAN - Discontinue doxepin for now.- Will consider down-titration of clonazepam.- Psych will continue to follow and re-eval as mental status improves.Electronically Artemio Aly, MD [id number] [phone] 4:30PM & Weekends/Holidays:Psychiatry Emergency Pager [phone]/2/20222:25 PM

## 2020-11-04 NOTE — Plan of Care
Plan of Care Overview/ Patient Status    Problem: Adult Inpatient Plan of CareGoal: Readiness for Transition of CareOutcome: Interventions implemented as appropriate Arthur [name] is a 55 y.o. 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 Admitted to SICU on 2/9 for ventilator management following RRT on oncology floor for desaturation and increased work of breathing while on trach mask, ultimately placed back on vent.Prior to admission at [facility].Last PT note 2/17/22Care Management will continue to follow with team. Gershon Crane RN, BSN, [facility] Manager NICU/SICUPhone# [phone]#  [phone]

## 2020-11-04 NOTE — Consults
Hematology FOLLOW-UP E-Note      Current Presentation:       CONSULT QUESTION: Evaluate patient with history of ITP with thrombocytopenia, ?concern for HIT    INTERVAL HISTORY:   Admitted to SICU on 2/9 for vent management following RRT on onc floor. Palliative radiation started 2/16 thru 2/23 with possible response however, remains critically ill, sedated.. Having secretions from trach. No ENT interventions advised at this time. Transitioned to DNR on 2/25 with GOC discussions ongoing. 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 He is s/p trach and PEG and also has a history of CKD stage 3. He was admitted initially to [facility] due to SOB and then transferred to [facility] on the ENT service for trach management. He was recently noted to have a necrotic mass surrounding his trach stoma but could not get imaging as an outpatient due to inability to lie flat, on admission here he had a [place] soft tissue neck on 10/10/20 which showed a large necrotic laryngeal mass (see above with stomal invasion, involvement of thyroid gland, trachea, hypopharynx, and proximal cervical esophagus with associated necrotic L level 3 and 4 LAD) and incidentally found a right IJ thrombus for which our Hematology consult service was involved. Note is made that Arthur [name] has a R chest wall port so this would be considered a line-associated thrombus and provoking factors include central catheter as well as active malignancy. He has no personal hx of arterial or venous thrombosis. We recommended a RUE [name] to confirm this thrombosis, though unfortunately it looks like the right IJ was obscured by bandages on this imaging. We also discussed with the team that discussion should be held with ENT and rad onc given the large necrotic mass.LAS to determine if Arthur [name] was safe/appopriate and if so recommended heparin gtt high risk bleeding protocol which Arthur [name] was started on without significant bleeding though this is now on hold given drop in his platelet count. He was noted to have a mild worsening normocytic anemia thought to be from chemotherapy and thrombocytopenia at time of our initial visit was mild and similar to prior, chart hx of ITP previously treated with steroids and IVIG was noted, though IPF low which spoke against a consumptive/destructive process such as ITP. 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 PBS shwoed normocytic normochromic hypoproliferative anemai with occ ovalocytes but no evidence of hemolysis. Leukocytosis were left shifted and PMNs with scattered toxic granulation suggestive of stress response. Thrombocytopenia with low IPF said to be suggestive of hypoproliferative bone marrow. Coags have been normal with no evidence of DIC.     Complete 12 point ROS is otherwise non-contributory except as noted above.    Past Medical History:   Diagnosis Date   ? Diabetes mellitus (HC Code) (HC CODE)    ? Hypercholesteremia    ? Hypertension        No past surgical history on file.    family history includes Aneurysm in his mother.    No Known  Allergies    Medications:  Scheduled Meds:  Current Facility-Administered Medications   Medication Dose Route Frequency Provider Last Rate Last Admin   ? albuterol neb sol 2.5 mg/3 mL (0.083%) (PROVENTIL,VENTOLIN)  2.5 mg Nebulization Q6H WA [employer], [place], [place]   2.5 mg at 11/02/20 1610   ? ascorbic acid (vitamin C) (VITAMIN C) tablet 250 mg  250 mg Per G Tube Daily Jennye Boroughs, PA   250 mg at 11/02/20 1034   ? chlorhexidine gluconate (PERIDEX) 0.12 % solution 15 mL  15 mL Mouth/Throat Q12H Leonia Reeves, PA   15 mL at 11/02/20 1034   ? clonazePAM (KlonoPIN) tablet 0.5 mg  0.5 mg Per J Tube BID Mat Carne, APRN   0.5 mg at 11/02/20 1034   ? insulin regular human (HumuLIN R, NovoLIN R) 100 unit/mL injection 5 Units  5 Units IV Push Once Mat Carne, APRN        And   ? dextrose 10% injection 250 mL  25 g Intravenous Once Mat Carne, APRN   250 mL at 11/02/20 1240   ? doxepin (SINEquan) solution 10 mg  10 mg Per J Tube Nightly Mat Carne, APRN   10 mg at 11/01/20 2008   ? doxycycline TAB/CAP 100 mg  100 mg Per G Tube Q12H Mat Carne, APRN   100 mg at 11/02/20 1035   ? folic acid (FOLVITE) tablet 1 mg  1 mg Per G Tube Daily Jennye Boroughs, [place]   1 mg at 11/02/20 1035   ? heparin (porcine) injection 7,500 Units  7,500 Units Subcutaneous Q8H Madlener, Melvin, [place]   7,500 Units at 11/02/20 9604   ? insulin glargine (Semglee,Lantus) injection 15 Units  15 Units Subcutaneous Q12H Woodford, Kandis Nab, APRN       ? [START ON 11/03/2020] insulin regular human (HumuLIN R, NovoLIN R) 100 unit/mL injection 9 Units  9 Units Subcutaneous Q6H Woodford, Kandis Nab, APRN       ? insulin regular human (HumuLIN R, NovoLIN R) Sliding Scale (See admin instructions for dose)   Subcutaneous Q6H Woodford, Kandis Nab, APRN       ? miconazole nitrate (SECURA EXTRA THICK ANTIFUNGAL) 2 % cream   Topical (Top) BID Lisette Abu, [place]   Given at 11/02/20 1035   ? midazolam (PF) (VERSED) 1 mg/mL injection            ? sodium chloride 0.9 % (new bag) bolus 1,000 mL  1,000 mL Intravenous Once Izetta Dakin Kandis Nab, APRN       ? thiamine (VITAMIN B1) tablet 50 mg  50 mg Per G Tube Daily Jennye Boroughs, PA   50 mg at 11/02/20 1035     Continuous Infusions:  ? norepinephrine 0.06 mcg/kg/min (11/02/20 1100)   ? propofol (DIPRIVAN) 1,000 mg in 100 mL (10 mg/mL) 35 mcg/kg/min (11/02/20 1123)     PRN Meds:.acetaminophen, dextrose (GLUCOSE) 40 % gel 15 g **OR** fruit juice **OR** skim milk, dextrose (GLUCOSE) 40 % gel 30 g **OR** fruit juice, dextrose injection, dextrose injection, insulin regular human **AND** dextrose injection **AND** dextrose injection **AND** POCT Glucose, glucagon, HYDROmorphone, LORazepam, oxyCODONE **OR** oxyCODONE, zinc oxide    Physical Exam:       Vitals:   Vitals:    11/02/20 1111   BP:    Pulse: 85   Resp: (!) 22   Temp: 99.86 ?F (37.7 ?C)       Data:  [name]:  Results in Past 7 Days  Result Component Current Result   Hematocrit 26.00 (L) (11/02/2020)   Hemoglobin 8.1 (L) (11/02/2020)   MCH 27.5 (11/02/2020)   MCHC 31.2 (11/02/2020)   MCV 88.1 (11/02/2020)   MPV 12.6 (H) (11/02/2020)   Platelets 62 (L) (11/02/2020)   RBC 2.95 (L) (11/02/2020)   WBC 9.3 (11/02/2020)       Chemistry    Lab Results   Component Value Date    NA 138 11/02/2020    K 5.7 (H) 11/02/2020    CL 109 (H) 11/02/2020    CO2 19 (L) 11/02/2020    BUN 68 (H) 11/02/2020    CREATININE 2.21 (H) 11/02/2020    GLU 196 (H) 11/02/2020    Lab Results   Component Value Date    CALCIUM 9.6 11/02/2020    ALKPHOS 72 10/15/2020    AST 17 10/15/2020    ALT 17 10/15/2020    BILITOT 0.2 10/15/2020        Component 10/13/20 ?4:24 AM   Blood Smear Interpretation Normocytic normochromic hypoproliferative (RPI <2) anemia with occasional ovalocytes. No smear evidence of hemolysis. Leukocytes with left shift and neutrophils with scattered toxic granulation suggestive of stress response. Lymphopenia with some reactive forms. Thrombocytopenia with low IPF suggestive of hypoproliferative bone marrow.            IMAGING:  XR Chest PA or AP    Result Date: 10/29/2020  Worsening fluid overload. Patchy opacity in the right upper lung may represent focal edema versus pneumonia. Layering left pleural effusion. Reported And Signed By: Maryjane Hurter, MD  [facility] Radiology and Biomedical Imaging    XR Chest PA or AP    Result Date: 10/28/2020  Small, left greater than right pleural effusion with accompanying bibasilar atelectasis. Reported And Signed By: Maudie Flakes, MD  [facility] Radiology and Biomedical Imaging         Assessment and Recommendations:         Arthur [name] is a 55 yo gentleman with history of CKD, WM s/p BR x 6 cycles in 2014 with CR, ITP in 09/2017 s/p steroid taper + IVIG x1 on 10/2017, and laryngeal SCC dx 06/2020 s/p cisplatin/XRT started 09/2020. He is currently trach and PEG. He was noted ot have necrotic mass surrounding trach stoma at OSH and incidentally found to have R IJ thrombus by [place], which was likely provoked by R chest port/line. However, upper extremity ultrasound did not note R IJ thrombus but rather noted superficial venous thrombosis. He was initially empirically treated for DVT with high risk heparin gtts on 10/10/20, however he notably had down-trending thrombocytopenia and AC has been held since 10/13/20. At this point, ITP has been invoked given his prior history although normally a diagnosis of exclusion. Other possibilities include infection or inflammation or critical illness related myelosuppression or chemo related cytopenia and bone marrow suppression, however last dose was C1D15, 09/28/20. There has been no evidence of hemolysis per [name] or by smear.   ?  If thrombocytopenia worsens or is limiting future therapies or patient is having clinically significant bleeding, we may recommend ITP-directed therapy with dexamethasone 40 mg daily x 4 days with no subsequent taper. IVIG can quickly increase the platelet count and thus remains an option for patients actively bleeding or have an impending invasive procedure or patients who are steroid-intolerant.       Hematology re-consulted regarding possibility of HIT.     He began receiving heparin on 2/4,  it was held 2/8 when PLT was nadiring around <40-50, then it was resumed on 2/14 and discontinued today on 2/28. Of note during this duration of time on heparin, PLT increased and he did received dex 8 mg BID starting on 2/22 and his PLT continued to rise > 150k for the first time this hospitalization. Steroids were discontinued on 2/24 and since then PLT has continued to down-trend. However, trending his PLT over the last 12 months, it seems his baseline is 50-60k, present since 2020-2021. We agree his current PLT seems to be returning to his baseline, and the increase in PLT reflects ITP response to dexamethasone.     HIT score: 3 (>50% fall, nadir > 20, PLT fall > day 10 of heparin, < 30 days of prior heparin exposure, no new thrombosis since 2/14, and ITP being definite other cause of thrombocytopenia, as well as hypoproliferative marrow from chemotherapy and/or inflammation.   Score of 3 correlates to low clinical probabilty score of HIT (0-1.6%).     Plan:   - Do not recommend sending HIT panel.   - Hold heparin anticoagulation if PLT < 50k  - reserve ITP-directed therapy in case of bleeding, pre-procedural, or if PLT < 30k    Patient case discussed with attending Dr. Garfield Cornea whose addendum will follow.      Warnell Forester, MD MPH  Clinical Fellow   Hematology/Oncology  [facility]  [facility] [name]: [phone]  11/02/2020  12:44 PM    This eConsult is based on the clinical data available to me and is furnished without benefit of a comprehensive evaluation or physical examination. The above will need to be interpreted in light of any clinical issues, or changes in patient status, not available to me at the time of filing this eConsult. Please alert me if you have further questions.    Attending Addendum:  I have discussed the patient with the inpatient Hematology team on 11/02/2020 and agree with the hematology/oncology fellow's assessment and plan.  This eConsult is based on the clinical data available to me and is furnished without benefit of a comprehensive evaluation or physical examination. The above will need to be interpreted in light of any clinical issues, or changes in patient status, not available to me at the time of filing this eConsult. Please alert me if you have further questions.    Agree with ArthurMirza assessment that it appears that platelets are returning to patient's baseline (in the setting of suspected ITP) though the trend in the next couple of days will make it more clear. I suspect that plt count will stabilize soon. Probability of HIT is low. ITP directed therapy would be indicated for reasons mentioned above in Dr. Dessie Coma note and we would be happy to assist with that if it becomes necessary. Thank you for the consultation.    Otherwise agree with the assessment and plan outlined above.    Garfield Cornea, M.D.  Hematology Attending

## 2020-11-04 NOTE — Progress Notes
Surgical Intensive Care Unit  AttendingI have personally performed a face to face diagnostic evaluation on this patient, reviewed the chart, available data and care plan and have personally formulated the plan outlined below. ?I have seen this patient in addition to the Surgical ICU advanced practitioner given patient's complexity and associated need for critical care.??54 y.o.?male?admitted to the surgical intensive care unit on 10/14/2020 for?on?10/09/2020?for respiratory failure. 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, and was transferred on 10/09/20?status post tracheostomy and PEG placement for partial obstruction of tracheostomy site with a new necrotic mass. ?The patient's hospital course was significant for a knee tracheostomy exchange 10/10/20, complicated by episodes of hypoxia requiring suctioning of mucus plugs and transferred to the surgical intensive care unit on 10/14/2020. ?The patient received palliative radiation treatments, completed on 10/28/20.??His hospital course has been notable for altered mental status and decreased responsiveness, continued vasopressor requirement, pressure support trials, ongoing antibiotic treatment for possible aspiration pneumonia.Hospital Day 26 Past Medical History: Diagnosis Date ? Diabetes mellitus (HC Code) (HC CODE)  ? Hypercholesteremia  ? Hypertension  No past surgical history on file.Scheduled Meds:Current Facility-Administered Medications Medication Dose Route Frequency Provider Last Rate Last Admin ? albuterol neb sol 2.5 mg/3 mL (0.083%) (PROVENTIL,VENTOLIN)  2.5 mg Nebulization Q6H WA [place], [place], [place]   2.5 mg at 11/04/20 0818 ? ascorbic acid (vitamin C) (VITAMIN C) tablet 250 mg 250 mg Per G Tube Daily Jennye Boroughs, [place]   250 mg at 11/04/20 1610 ? chlorhexidine gluconate (PERIDEX) 0.12 % solution 15 mL  15 mL Mouth/Throat Q12H Leonia Reeves, PA   15 mL at 11/04/20 0910 ? clonazePAM (KlonoPIN) tablet 0.5 mg  0.5 mg Per J Tube BID Mat Carne, APRN   0.5 mg at 11/04/20 9604 ? doxepin (SINEquan) solution 10 mg  10 mg Per J Tube Nightly Mat Carne, APRN   10 mg at 11/03/20 2124 ? doxycycline TAB/CAP 100 mg  100 mg Per G Tube Q12H Mat Carne, APRN   100 mg at 11/04/20 5409 ? famotidine (PEPCID) tablet 20 mg  20 mg Per J Tube Daily Cusick, Lauren, PA   20 mg at 11/04/20 0910 ? folic acid (FOLVITE) tablet 1 mg  1 mg Per G Tube Daily [place], [place]   1 mg at 11/04/20 8119 ? [Held by provider] heparin (porcine) injection 7,500 Units  7,500 Units Subcutaneous Q8H Madlener, [place], [place]   7,500 Units at 11/02/20 2151 ? insulin glargine (Semglee,Lantus) injection 15 Units  15 Units Subcutaneous Q12H Sharyne Richters, PA   15 Units at 11/04/20 1478 ? [Held by provider] insulin glargine (Semglee,Lantus) injection 30 Units  30 Units Subcutaneous Daily Ciccia, Oneida Arenas, PA     ? [Held by provider] insulin regular human (HumuLIN R, NovoLIN R) 100 unit/mL injection 9 Units  9 Units Subcutaneous Q6H [place], Kandis Nab, APRN     ? miconazole nitrate (SECURA EXTRA THICK ANTIFUNGAL) 2 % cream   Topical (Top) BID Lisette Abu, [place]   Given at 11/04/20 2956 ? midazolam (PF) (VERSED) 1 mg/mL injection          ? thiamine (VITAMIN B1) tablet 50 mg  50 mg Per G Tube Daily Jennye Boroughs, PA   50 mg at 11/04/20 0910 Continuous Infusions:? insulin regular human 100 units in sodium chloride 0.9 % 100 mL 2 mL/hr  at 11/04/20 0800 ? propofol (DIPRIVAN) 1,000 mg in 100 mL (10 mg/mL) 20 mcg/kg/min (11/04/20 0910) PRN Meds: acetaminophen, Initiate Insulin Infusion Protocol for Adult Patients (Nurse-Driven) **AND** [COMPLETED] insulin regular human **AND** insulin regular human 100 units in sodium chloride 0.9 % 100 mL **AND** POC Glucose (Fingerstick) **AND** Nursing communication **AND** dextrose injection **AND** dextrose injection, glucagon, HYDROmorphone, LORazepam, oxyCODONE **OR** oxyCODONE, zinc oxideVITALS:Temp (24hrs), Avg:100.1 ?F (37.8 ?C), Min:98.78 ?F (37.1 ?C), Max:101.12 ?F (38.4 ?C)Vitals:  11/04/20 0500 11/04/20 0600 11/04/20 0700 11/04/20 0800 BP: 119/63 (!) 106/49 (!) 108/48 123/68 Pulse: 86 89 89 (!) 93 Resp: (!) 25 (!) 26 (!) 24 (!) 32 Temp: 98.78 ?F (37.1 ?C) 98.96 ?F (37.2 ?C) 99.14 ?F (37.3 ?C) 99.14 ?F (37.3 ?C) TempSrc: Bladder Bladder   SpO2: 97% 98% 97% 99% Weight:     Height:     Intake/Output Summary (Last 24 hours) at 11/04/2020 0921Last data filed at 11/04/2020 0800Gross per 24 hour Intake 2596.29 ml Output 3420 ml Net -823.71 ml Exam unchanged as of 11/04/2020 with exceptions noted belowPhysical ExamLabs Last 24 hours:Most Recent Result Component Value Date/Time  Sodium 139 11/04/2020 05:16 AM  Potassium 5.3 11/04/2020 05:16 AM  Chloride 109 (H) 11/04/2020 05:16 AM  CO2 19 (L) 11/04/2020 05:16 AM  BUN 69 (H) 11/04/2020 05:16 AM  Creatinine 2.09 (H) 11/04/2020 05:16 AM  WBC 9.0 11/04/2020 05:16 AM  Hemoglobin 8.0 (L) 11/04/2020 05:16 AM  Hematocrit 26.30 (L) 11/04/2020 05:16 AM  Platelets 34 (L) 11/04/2020 05:16 AM Imaging Last 24 hours:XR Chest PA or APResult Date: 3/1/2022No change compared to the study from one week prior. Tracheostomy tube in appropriate position. Reported And Signed By: Leilani Merl, MD  [facility] Biomed[facility] ImagingOvernight Events:--?Ongoing PSV trials-- Hyperkalemia, improved-- Stable thrombocytopenia, SQH held, Hematology consulted-- Weaned of norepinephrineInfectious Disease/Sepsis:?MRSA pneumonia-- Continue to monitor WBC count and fever curve-- Continue?doxycycline (end date 11/07/20)-- Prior antibiotics:??Vancomycin (2/5 - 2/14, 2/16 - 2/22);?ceftazidime?(2/17 - 2/19);?ciprofloxacin?(2/19 - 2/23)HEENT:Squamous cell laryngeal cancer invading tracheostomy, status post tracheostomy exchange?10/10/20?and biopsy on 10/12/20-- Continue?Bivona?tracheostomy to bypass obstruction, maintain additional tracheostomy at the bedside-- Completed course of palliative radiation as per oncology recommendations?(2/16 - 2/23)-- Continue?to follow-up Oncology recommendations, we appreciate the consult-- Continue?to follow-up?with a primary team (ENT) recommendations; no surgical management at this time as per their recommendationsCardiac:?Septic shock, improved-- Continue to monitor hemodynamic status-- Weaned of norepinephrine 3/1 at 2PM-- Continue goal MAP > 65 mmHgHistory of hypertension, hyperlipidemia-- Continue?to hold home lisinopril, Lasix, Crestor until hemodynamically stable and tolerating PORespiratory:?Respiratory failure with circumferential squamous cell laryngeal cancer invading stoma, with intermittent mucus plugging/tracheal occlusion-- Wean off?mechanical ventilation?as tolerated-- Encourage IS and pulmonary toilet when able-- Continue?albuterol nebs q6 hours while awake-- Continue?chest physiotherapy q6 hrs?hours while awakeRenal, Fluids/Electrolytes:?AKI on CKD (baseline creatinine 1.5 to 1.6), with recent supratherapeutic vancomycin level, now stable-- Maintain Foley catheter for the strict monitoring of I/O in this critically ill patient;?placed for urinary retention on?10/22/20, will perform voiding trial when more stable-- Replete electrolytes as neededHematology:?????Anemia, stable-- Continue to monitor H/HHistory of chronic ITP, Walton storms macroglobulinemia; thrombocytopenia-- Consult Hematology today given the downtrending platelets-- Possible steroids vs IVIG with continued thrombocytopenia as per Hematology recommendations-- Continue?to follow-up Hematology recommendations; as per their evaluation and recent response to respiratory steroids, thrombocytopenia possibly secondary to ITP, hold HITT panel held as per Hematology recommendationsRight IJ thrombus-- Continue?to hold off on systemic anticoagulation given thrombocytopenia, waiting Hematology recommendations-- Hold SQH as per Hematology recommendations, see aboveEndocrine:??Diabetes, hyperglycemia-- Insulin protocol for hyperglycemia-- Continue to monitor FSs-- Continue to hold home metformin,  Lantus,?Apidra-- Continue?to follow-up endocrinology/diabetes Team recommendations, we appreciate the consultGI/Nutrition:???Diarrhea, improved-- Diet as per Primary Team?via PEG-- Continue bowel regimenNeurologic:??Anxiety, depression; delirium without focal deficit-- Continue?clonazepam 0.5mg  via PEG BID-- Continue doxepin 10mg  via PEG qHS-- Continue?lorazepam?0.5mg  IV q6hrs PRN?severe anxiety-- Continue?to follow-up Psychiatry recommendations, we appreciate the consult-- Continue?to follow-up Palliative Care recommendations, we appreciate the consultPain-- Continue to monitor neurologic status-- Continue acetaminophen 650mg  via PEG q4 hours PRN pain, fever-- Continue?oxycodone 10mg  and 16m via PEG q4 hrs PRN moderate to severe pain, respectively-- Continue?hydromorphone 1mg  IV q3hrs PRN?breakthrough painSedation while intubated-- Continue propofol?given ventilator dyssynchrony with weaning; wean off as toleratedPain, Sedation Management:Pain is being adequately controlled with the current regimenSedation is being adequately addressed by this patient's current regimen Musculoskeletal:??-- PT/OT when ableSkin/wound: Frequent repositioning to mitigate against pressure ulcerationGoals of Care/Advance Directives:Code status:???DNRLines:--?PIVs-- Right port (08/12/20)-- PEG (09/14/20)-- Trach--?Foley catheter?(10/22/20)Prophylaxis: ?-- VAP: HOB elevation;?chlorhexidine-- GI: HOB elevation; famotidine-- VTE: SCD; SQH held secondary to thrombocytopeniaPlan discussed with:??Primary Service, Consult Services, Critical Care Nursing and Resident, Respiratory Therapy, Pharmacy, and the Patient. ?The patient's family was not present for rounds this AM.This patient is critically ill as indicated by the following Acute Respiratory Failure and SepsisThe patient has required 42 minutes of my undivided attention during the past 24 hours for one of the previous mentioned life threatening diagnoses. The critical care interventions have included: Management of Acute Respiratory Failure with adjustments to ventilator settings/mode to maintain adequate ventilation / oxygenation  and sepsis with Hildred Laser, MD SICU Attending3/10/2020

## 2020-11-05 LAB — CBC WITH AUTO DIFFERENTIAL
BKR WAM ABSOLUTE IMMATURE GRANULOCYTES.: 0.59 x 1000/ÂµL — ABNORMAL HIGH (ref 0.00–0.30)
BKR WAM ABSOLUTE LYMPHOCYTE COUNT.: 0.25 x 1000/ÂµL — ABNORMAL LOW (ref 0.60–3.70)
BKR WAM ABSOLUTE NRBC (2 DEC): 0 x 1000/ÂµL (ref 0.00–1.00)
BKR WAM ANALYZER ANC: 7.84 x 1000/ÂµL — ABNORMAL HIGH (ref 2.00–7.60)
BKR WAM BASOPHIL ABSOLUTE COUNT.: 0.04 x 1000/ÂµL (ref 0.00–1.00)
BKR WAM BASOPHILS: 0.4 % (ref 0.0–1.4)
BKR WAM EOSINOPHIL ABSOLUTE COUNT.: 0.26 x 1000/ÂµL (ref 0.00–1.00)
BKR WAM EOSINOPHILS: 2.6 % (ref 0.0–5.0)
BKR WAM HEMATOCRIT (2 DEC): 25.8 % — ABNORMAL LOW (ref 38.50–50.00)
BKR WAM HEMOGLOBIN: 7.9 g/dL — ABNORMAL LOW (ref 13.2–17.1)
BKR WAM IMMATURE GRANULOCYTES: 6 % — ABNORMAL HIGH (ref 0.0–1.0)
BKR WAM LYMPHOCYTES: 2.5 % — ABNORMAL LOW (ref 17.0–50.0)
BKR WAM MCH (PG): 27.2 pg (ref 27.0–33.0)
BKR WAM MCHC: 30.6 g/dL — ABNORMAL LOW (ref 31.0–36.0)
BKR WAM MCV: 89 fL (ref 80.0–100.0)
BKR WAM MONOCYTE ABSOLUTE COUNT.: 0.91 x 1000/ÂµL (ref 0.00–1.00)
BKR WAM MONOCYTES: 9.2 % (ref 4.0–12.0)
BKR WAM MPV: 13 fL — ABNORMAL HIGH (ref 8.0–12.0)
BKR WAM NEUTROPHILS: 79.3 % — ABNORMAL HIGH (ref 39.0–72.0)
BKR WAM NUCLEATED RED BLOOD CELLS: 0 % (ref 0.0–1.0)
BKR WAM PLATELETS: 42 x1000/ÂµL — ABNORMAL LOW (ref 150–420)
BKR WAM RDW-CV: 17.3 % — ABNORMAL HIGH (ref 11.0–15.0)
BKR WAM RED BLOOD CELL COUNT.: 2.9 M/ÂµL — ABNORMAL LOW (ref 4.00–6.00)
BKR WAM WHITE BLOOD CELL COUNT: 9.9 x1000/ÂµL (ref 4.0–11.0)

## 2020-11-05 LAB — BASIC METABOLIC PANEL
BKR ANION GAP: 12 (ref 7–17)
BKR ANION GAP: 11 (ref 7–17)
BKR ANION GAP: 12 (ref 7–17)
BKR BLOOD UREA NITROGEN: 83 mg/dL — ABNORMAL HIGH (ref 6–20)
BKR BLOOD UREA NITROGEN: 84 mg/dL — ABNORMAL HIGH (ref 6–20)
BKR BLOOD UREA NITROGEN: 83 mg/dL — ABNORMAL HIGH (ref 6–20)
BKR BUN / CREAT RATIO: 39.2 — ABNORMAL HIGH (ref 8.0–23.0)
BKR BUN / CREAT RATIO: 40 — ABNORMAL HIGH (ref 8.0–23.0)
BKR BUN / CREAT RATIO: 40.1 — ABNORMAL HIGH (ref 8.0–23.0)
BKR CALCIUM: 10 mg/dL (ref 8.8–10.2)
BKR CALCIUM: 10.3 mg/dL — ABNORMAL HIGH (ref 8.8–10.2)
BKR CALCIUM: 10 mg/dL (ref 8.8–10.2)
BKR CHLORIDE: 109 mmol/L — ABNORMAL HIGH (ref 98–107)
BKR CHLORIDE: 110 mmol/L — ABNORMAL HIGH (ref 98–107)
BKR CHLORIDE: 111 mmol/L — ABNORMAL HIGH (ref 98–107)
BKR CO2: 18 mmol/L — ABNORMAL LOW (ref 20–30)
BKR CO2: 19 mmol/L — ABNORMAL LOW (ref 20–30)
BKR CO2: 18 mmol/L — ABNORMAL LOW (ref 20–30)
BKR CREATININE: 2.12 mg/dL — ABNORMAL HIGH (ref 0.40–1.30)
BKR CREATININE: 2.1 mg/dL — ABNORMAL HIGH (ref 0.40–1.30)
BKR CREATININE: 2.07 mg/dL — ABNORMAL HIGH (ref 0.40–1.30)
BKR EGFR (AFR AMER): 39 mL/min/{1.73_m2} (ref >60)
BKR EGFR (AFR AMER): 40 mL/min/{1.73_m2} (ref >60)
BKR EGFR (AFR AMER): 40 mL/min/{1.73_m2} (ref >60)
BKR EGFR (NON AFRICAN AMERICAN): 32 mL/min/{1.73_m2} (ref >60)
BKR EGFR (NON AFRICAN AMERICAN): 33 mL/min/{1.73_m2} (ref >60)
BKR EGFR (NON AFRICAN AMERICAN): 33 mL/min/{1.73_m2} (ref >60)
BKR GLUCOSE: 132 mg/dL — ABNORMAL HIGH (ref 70–100)
BKR GLUCOSE: 161 mg/dL — ABNORMAL HIGH (ref 70–100)
BKR GLUCOSE: 148 mg/dL — ABNORMAL HIGH (ref 70–100)
BKR POTASSIUM: 6.1 mmol/L — CR (ref 3.3–5.3)
BKR POTASSIUM: 6 mmol/L — CR (ref 3.3–5.3)
BKR POTASSIUM: 5.8 mmol/L — ABNORMAL HIGH (ref 3.3–5.3)
BKR SODIUM: 139 mmol/L (ref 136–144)
BKR SODIUM: 140 mmol/L (ref 136–144)
BKR SODIUM: 141 mmol/L (ref 136–144)

## 2020-11-05 LAB — POTASSIUM: BKR POTASSIUM: 5.6 mmol/L — ABNORMAL HIGH (ref 3.3–5.3)

## 2020-11-05 LAB — ALBUMIN: BKR ALBUMIN: 2.4 g/dL — ABNORMAL LOW (ref 3.6–4.9)

## 2020-11-05 LAB — IMMATURE PLATELET FRACTION (BH GH LMW YH)
BKR WAM IPF, ABSOLUTE: 2.8 x1000/ÂµL (ref <20.0)
BKR WAM IPF: 6.6 % (ref 1.2–8.6)

## 2020-11-05 LAB — MAGNESIUM: BKR MAGNESIUM: 2.2 mg/dL (ref 1.7–2.4)

## 2020-11-05 LAB — PHOSPHORUS     (BH GH L LMW YH): BKR PHOSPHORUS: 5.1 mg/dL — ABNORMAL HIGH (ref 2.2–4.5)

## 2020-11-05 MED ORDER — INSULIN U-100 REGULAR HUMAN 100 UNIT/ML INJECTION SOLUTION
100 unit/mL | Freq: Once | INTRAVENOUS | Status: CP
Start: 2020-11-05 — End: ?

## 2020-11-05 MED ORDER — DEXTROSE 10 % IN WATER (D10W) INTRAVENOUS SOLUTION
10 % | INTRAVENOUS | Status: AC
Start: 2020-11-05 — End: ?
  Administered 2020-11-05: 20:00:00 10 mL/h via INTRAVENOUS

## 2020-11-05 MED ORDER — INSULIN U-100 REGULAR HUMAN 100 UNIT/ML INJECTION SOLUTION
100 unit/mL | Freq: Once | INTRAVENOUS | Status: CP
Start: 2020-11-05 — End: ?
  Administered 2020-11-05: 15:00:00 100 mL via INTRAVENOUS

## 2020-11-05 MED ORDER — AMINO ACIDS-PROTEIN HYDROLYSATE 15 GRAM-60 KCAL/30 ML ORAL LIQUID PACK
15-60-30 gram-kcal/30 mL | Freq: Three times a day (TID) | GASTROSTOMY | Status: DC
Start: 2020-11-05 — End: 2020-11-17
  Administered 2020-11-05 – 2020-11-17 (×36): 15-60 gram-kcal/30 mL via GASTROSTOMY

## 2020-11-05 MED ORDER — SODIUM BICARBONATE 650 MG TABLET
650 mg | Freq: Three times a day (TID) | GASTROSTOMY | Status: DC
Start: 2020-11-05 — End: 2020-11-05

## 2020-11-05 MED ORDER — CALCIUM GLUCONATE 1GM MINI-BAG PLUS
Freq: Once | INTRAVENOUS | Status: DC
Start: 2020-11-05 — End: 2020-11-05

## 2020-11-05 MED ORDER — SODIUM ZIRCONIUM CYCLOSILICATE 10 GRAM ORAL POWDER PACKET
10 gram | Freq: Once | ORAL | Status: CP
Start: 2020-11-05 — End: ?
  Administered 2020-11-05: 23:00:00 10 gram via ORAL

## 2020-11-05 MED ORDER — SODIUM BICARBONATE 650 MG TABLET
650 mg | Freq: Three times a day (TID) | GASTROSTOMY | Status: DC
Start: 2020-11-05 — End: 2020-11-06
  Administered 2020-11-05 – 2020-11-06 (×4): 650 mg via GASTROSTOMY

## 2020-11-05 MED ORDER — DEXTROSE 10 % IV BOLUS FOR ORDERABLE
Freq: Once | INTRAVENOUS | Status: CP
Start: 2020-11-05 — End: ?
  Administered 2020-11-05: 11:00:00 via INTRAVENOUS

## 2020-11-05 MED ORDER — DEXTROSE 10 % IV BOLUS FOR ORDERABLE
INTRAVENOUS | Status: AC | PRN
Start: 2020-11-05 — End: ?

## 2020-11-05 MED ORDER — SODIUM ZIRCONIUM CYCLOSILICATE 10 GRAM ORAL POWDER PACKET
10 gram | Freq: Once | ORAL | Status: CP
Start: 2020-11-05 — End: ?
  Administered 2020-11-05: 15:00:00 10 gram via ORAL

## 2020-11-05 MED ORDER — SODIUM CHLORIDE 0.9 % BOLUS (NEW BAG)
0.9 % | Freq: Once | INTRAVENOUS | Status: CP
Start: 2020-11-05 — End: ?
  Administered 2020-11-05: 15:00:00 0.9 mL/h via INTRAVENOUS

## 2020-11-05 MED ORDER — CALCIUM GLUCONATE 1GM MINI-BAG PLUS
Freq: Once | INTRAVENOUS | Status: CP
Start: 2020-11-05 — End: ?
  Administered 2020-11-05: 11:00:00 100.000 mL/h via INTRAVENOUS

## 2020-11-05 MED ORDER — DEXTROSE 10 % IV BOLUS FOR ORDERABLE
Freq: Once | INTRAVENOUS | Status: CP
Start: 2020-11-05 — End: ?
  Administered 2020-11-05: 15:00:00 via INTRAVENOUS

## 2020-11-05 NOTE — Plan of Care
Plan of Care Overview/ Patient Status    Neuro improved from yesterday.  Following commands consistently and nodding yes and no.  One period of agitation this afternoon resolved after medication and redirection.  Precedex unchanged, PRN meds given as needed per [facility].  HR/BP WNL, temp 99s.  Tolerated SBP 10/5 x 6 hours.  Attempted 5/5 trial but failed after 5 minutes for increased WOB and tachypnea.  Will trial again 6 pm to midnight then rest overnight.  Secretions improved from yesterday.  TF changed to Nepro, Prosource given.  UOP adequate, RT with liquid stool.  Insulin IV push x 2 for hyperkalemia, received Lokelma x 1 this morning and will receive again this evening.  Skin as documented, excoriation to buttocks slightly worsened today with open areas.  Offloading provided, Secura applied.  Sister at bedside this afternoon to visit patient for his birthday, updated on POC.  Insulin gtt per protocol.

## 2020-11-05 NOTE — Progress Notes
Surgical Intensive Care Unit  AttendingI have personally performed a face to face diagnostic evaluation on this patient, reviewed the chart, available data and care plan and have personally formulated the plan outlined below. ?I have seen this patient in addition to the Surgical ICU advanced practitioner given patient's complexity and associated need for critical care.??54 y.o.?male?admitted to the surgical intensive care unit on 10/14/2020 for?on?10/09/2020?for respiratory failure. 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 ?The patient's hospital course was significant for a knee tracheostomy exchange 10/10/20, complicated by episodes of hypoxia requiring suctioning of mucus plugs and transferred to the surgical intensive care unit on 10/14/2020. ?The patient received palliative radiation treatments, completed on 10/28/20.??His hospital course has been notable for altered mental status and decreased responsiveness, continued vasopressor requirement, pressure support trials, ongoing antibiotic treatment for possible aspiration pneumonia.Hospital Day 27 Past Medical History: Diagnosis Date ? Diabetes mellitus (HC Code) (HC CODE)  ? Hypercholesteremia  ? Hypertension  No past surgical history on file.Scheduled Meds:Current Facility-Administered Medications Medication Dose Route Frequency Provider Last Rate Last Admin ? albuterol neb sol 2.5 mg/3 mL (0.083%) (PROVENTIL,VENTOLIN)  2.5 mg Nebulization Q6H [place], [place], [place]   2.5 mg at 11/05/20 1610 ? ascorbic acid (vitamin C) (VITAMIN C) tablet 250 mg 250 mg Per G Tube Daily Jennye Boroughs, PA   250 mg at 11/04/20 9604 ? chlorhexidine gluconate (PERIDEX) 0.12 % solution 15 mL  15 mL Mouth/Throat Q12H Leonia Reeves, PA   15 mL at 11/04/20 2115 ? clonazePAM (KlonoPIN) tablet 0.5 mg  0.5 mg Per J Tube BID Mat Carne, APRN   0.5 mg at 11/04/20 2116 ? insulin regular human (HumuLIN R, NovoLIN R) 100 unit/mL injection 10 Units  10 Units IV Push Once Ciccia, Oneida Arenas, PA      And ? dextrose 10% injection 250 mL  25 g Intravenous Once Bettey Costa, PA     ? doxycycline TAB/CAP 100 mg  100 mg Per G Tube Q12H Mat Carne, APRN   100 mg at 11/04/20 2116 ? famotidine (PEPCID) tablet 20 mg  20 mg Per J Tube Daily Cusick, Lauren, PA   20 mg at 11/04/20 0910 ? folic acid (FOLVITE) tablet 1 mg  1 mg Per G Tube Daily [place], [place]   1 mg at 11/04/20 5409 ? [Held by provider] heparin (porcine) injection 7,500 Units  7,500 Units Subcutaneous Q8H [place], [place], [place]   7,500 Units at 11/02/20 2151 ? insulin glargine (Semglee,Lantus) injection 15 Units  15 Units Subcutaneous Q12H Sharyne Richters, PA   15 Units at 11/04/20 2115 ? [Held by provider] insulin glargine (Semglee,Lantus) injection 30 Units  30 Units Subcutaneous Daily Ciccia, Oneida Arenas, PA     ? [Held by provider] insulin regular human (HumuLIN R, NovoLIN R) 100 unit/mL injection 9 Units  9 Units Subcutaneous Q6H [place], Kandis Nab, APRN     ? miconazole nitrate (SECURA EXTRA THICK ANTIFUNGAL) 2 % cream   Topical (Top) BID Lisette Abu, [place]   Given at 11/04/20 2116 ? midazolam (PF) (VERSED) 1 mg/mL injection          ? Prosource no carb 15 gram protein and 60 kcal  1 packet Per G Tube  TID Bettey Costa, PA     ? sodium bicarbonate tablet 650 mg  650 mg Per G Tube TID Bettey Costa, PA     ? sodium zirconium cyclosilicate (LOKELMA) oral powder packet 10 g  10 g Oral Once Bettey Costa, PA     ? thiamine (VITAMIN B1) tablet 50 mg  50 mg Per G Tube Daily Jennye Boroughs, PA   50 mg at 11/04/20 0910 Continuous Infusions:? dexmedetomidine 0.7 mcg/kg/hr (11/05/20 0910) ? insulin regular human 100 units in sodium chloride 0.9 % 100 mL 3 mL/hr at 11/05/20 0900 PRN Meds: acetaminophen, Initiate Insulin Infusion Protocol for Adult Patients (Nurse-Driven) **AND** [COMPLETED] insulin regular human **AND** insulin regular human 100 units in sodium chloride 0.9 % 100 mL **AND** POC Glucose (Fingerstick) **AND** Nursing communication **AND** dextrose injection **AND** dextrose injection, insulin regular human **AND** dextrose injection **AND** dextrose injection **AND** POCT Glucose, glucagon, HYDROmorphone, LORazepam, oxyCODONE **OR** oxyCODONE, zinc oxideVITALS:Temp (24hrs), Avg:99.2 ?F (37.3 ?C), Min:98.6 ?F (37 ?C), Max:100.58 ?F (38.1 ?C)Vitals:  11/05/20 0600 11/05/20 0700 11/05/20 0800 11/05/20 0815 BP: 114/64 121/67 (!) 102/56  Pulse: 78 (!) 96 81 (!) 91 Resp: (!) 24 (!) 27 (!) 22 (!) 28 Temp: 98.96 ?F (37.2 ?C) 99.14 ?F (37.3 ?C) 99.5 ?F (37.5 ?C)  TempSrc: Bladder    SpO2: 99% 98% 99% 97% Weight:     Height:     Intake/Output Summary (Last 24 hours) at 11/05/2020 0931Last data filed at 11/05/2020 0900Gross per 24 hour Intake 2675.15 ml Output 3450 ml Net -774.85 ml Exam unchanged as of 11/05/2020 with exceptions noted belowPhysical ExamLabs Last 24 hours:Most Recent Result Component Value Date/Time  Sodium 140 11/05/2020 08:02 AM  Potassium 6.0 (HH) 11/05/2020 08:02 AM  Chloride 110 (H) 11/05/2020 08:02 AM  CO2 19 (L) 11/05/2020 08:02 AM  BUN 84 (H) 11/05/2020 08:02 AM  Creatinine 2.10 (H) 11/05/2020 08:02 AM  WBC 9.9 11/05/2020 04:17 AM  Hemoglobin 7.9 (L) 11/05/2020 04:17 AM  Hematocrit 25.80 (L) 11/05/2020 04:17 AM  Platelets 42 (L) 11/05/2020 04:17 AM Imaging Last 24 hours:No results found.Overnight Events:--?PS trial-- Hyperkalemia s/p treatment-- Weaned off propofol, currently on dexmedetomidineInfectious Disease/Sepsis:?MRSA pneumonia-- Continue to monitor WBC count and fever curve-- Continue?doxycycline (end date 11/07/20)-- Prior antibiotics:??Vancomycin (2/5 - 2/14, 2/16 - 2/22);?ceftazidime?(2/17 - 2/19);?ciprofloxacin?(2/19 - 2/23)HEENT:Squamous cell laryngeal cancer invading tracheostomy, status post tracheostomy exchange?10/10/20?and biopsy on 10/12/20-- Continue?Bivona?tracheostomy to bypass obstruction, maintain additional tracheostomy at the bedside; awaiting custom trach as per ENT recommendations-- Completed course of palliative radiation as per oncology recommendations?(2/16 - 2/23)-- Continue?to follow-up Oncology recommendations, we appreciate the consult-- Continue?to follow-up?with a primary team (ENT) recommendations; no surgical management at this time as per their recommendationsCardiac:?Septic shock, improved-- Continue to monitor hemodynamic status-- Weaned of norepinephrine 3/1 at 2PM-- Continue goal MAP > 65 mmHgHistory of hypertension, hyperlipidemia-- Continue?to hold home lisinopril, Lasix, Crestor until hemodynamically stable and tolerating PORespiratory:?Respiratory failure with circumferential squamous cell laryngeal cancer invading stoma, with intermittent mucus plugging/tracheal occlusion-- Wean off?mechanical ventilation?as tolerated-- Encourage IS and pulmonary toilet when able-- Continue SBT-- Continue?albuterol nebs q6 hours while awake-- Continue?chest physiotherapy q6 hrs?hours while awakeRenal, Fluids/Electrolytes:?AKI on CKD (baseline creatinine 1.5 to 1.6), with recent supratherapeutic vancomycin level, now stable-- Maintain Foley catheter for the strict monitoring of I/O in this critically ill patient;?placed for urinary retention on?10/22/20, will perform voiding trial when more stableHyperkalemia-- Continue to treat and monitor potassium levels-- Replete bicarbonate as neededHematology:?????Anemia, stable-- Continue to monitor H/HHistory of chronic ITP, Walton storms macroglobulinemia; thrombocytopenia-- Consult Hematology today  given the downtrending platelets-- Possible steroids vs IVIG with continued thrombocytopenia as per Hematology recommendations-- Continue?to follow-up Hematology recommendations; as per their evaluation and recent response to respiratory steroids, thrombocytopenia possibly secondary to ITP, hold HITT panel held as per Hematology recommendationsRight IJ thrombus-- Continue?to hold off on systemic anticoagulation given thrombocytopenia, waiting Hematology recommendations-- Hold SQH as per Hematology recommendations, see aboveEndocrine:??Diabetes, hyperglycemia-- Insulin protocol for hyperglycemia-- Continue to monitor FSs-- Continue to hold home metformin, Lantus,?Apidra-- Continue?to follow-up endocrinology/diabetes Team recommendations, we appreciate the consultGI/Nutrition:???Diarrhea, improved-- Diet as per Primary Team?via PEG-- Continue bowel regimenNeurologic:??Anxiety, depression; delirium without focal deficit-- Continue?clonazepam 0.5mg  via PEG BID-- Discontinue doxepin 10mg  via PEG qHS as per Psychiatry recommendations-- Continue?lorazepam?0.5mg  IV q6hrs PRN?severe anxiety-- Continue?to follow-up?Psychiatry recommendations, we appreciate the consult-- Continue?to follow-up?Palliative?Care recommendations, we appreciate the consultPain-- Continue to monitor neurologic status-- Continue acetaminophen 650mg  via PEG q4 hours PRN pain, fever-- Continue?oxycodone 10mg  and 19m via PEG q4 hrs PRN moderate to severe pain, respectively-- Continue?hydromorphone 1mg  IV q3hrs PRN?breakthrough painSedation while intubated-- Continue dexmedetomidine; wean off as toleratedPain, Sedation Management:Pain is being adequately controlled with the current regimenSedation is being adequately addressed by this patient's current regimen Musculoskeletal:??-- PT/OT when ableSkin/wound: Frequent repositioning to mitigate against pressure ulcerationGoals of Care/Advance Directives:Code status:???DNRLines:--?PIVs-- Right port (08/12/20)-- PEG (09/14/20)-- Trach--?Foley catheter?(10/22/20)Prophylaxis: ?-- VAP: HOB elevation;?chlorhexidine-- GI: HOB elevation; famotidine-- VTE: SCD; SQH?held secondary to thrombocytopeniaPlan discussed with:??Primary Service, Consult Services, Critical Care Nursing and Resident, Respiratory Therapy, Pharmacy, and the Patient. ?The patient's family was not present for rounds this AM.This patient is critically ill as indicated by the following Acute Respiratory Failure and SepsisThe patient has required 42 minutes of my undivided attention during the past 24 hours for one of the previous mentioned life threatening diagnoses. The critical care interventions have included: Management of Acute Respiratory Failure with adjustments to ventilator settings/mode to maintain adequate ventilation / oxygenation  and Hildred Laser, MD SICU Attending3/11/2020

## 2020-11-05 NOTE — Plan of Care
Plan of Care Overview/ Patient Status    Pt placed on 5/5 trial for 17 minutesThe goal was 30 minutesHe started coughing and sitting up in bed.  After sxning his RR was 41Settled out to RR 37 Vt 490 Ve12.9Switched him to PCV for a few minutes and once he settled out he was changed back to 10/5RR 22 Vt 520 spo2 97Pt trialed for 6 hours on 10/5 Pt tolerated trial wellRR 22VT around 400-500Returned to PC to rest

## 2020-11-05 NOTE — Progress Notes
Inpatient Diabetes Management Team Follow-up Note    Over the past 24 hours, the patient's glucose control has been controlled on the insulin infusion    ? Current Nutritional Status:  Continuous tube feeding with Nepro @ 65 mL/hr (251 grams of carb) changed from Peptamen Intense @ 85 cc/hr(155 gm CHO)    ? Current Anti-hyperglycemic Regimen:  glargine 15 units q 12 hours, Insulin infusion per protocol     ?  Home Anti-hyperglycemic Regimen: Toujeo U300 64 units daily, Apidra 20 units TID with meals, metformin 1000 mg bid     ? Other Contributing Medications:   ? Precedex infusion    PE:    Patient laying in bed sedated on the ventilator  Temp:  [98.6 ?F (37 ?C)-100.58 ?F (38.1 ?C)] 99.5 ?F (37.5 ?C)  Pulse:  [74-137] 91  Resp:  [14-39] 28  BP: (92-203)/(47-104) 102/56  SpO2:  [87 %-100 %] 97 %  Device (Oxygen Therapy): mechanical ventilator     Wt Readings from Last 3 Encounters:   11/05/20 (!) 148.2 kg   10/06/20 (!) 139.5 kg   09/29/20 (!) 142.4 kg       Data Review:  BGs:      Creatinine (mg/dL)   Date Value   16/06/9603 2.10 (H)     Hemoglobin A1c (%)   Date Value   10/10/2020 9.0 (H)       Assessment:  55 yo h/o type 2 diabetes, HTN, Waldenstroms macroglobulinemia, ETOH, laryngeal SCC s/p trach/PEG presented with SOB significant for tumor invading the trach stoma with partial obstruction and he was started on palliative chemo radiation. Course complicated by multiple episodes of Bivona trach occulusion/obstruction. Patient currently admitted to the SICU for ventilatory support and on IV antibiotics for treatment of pneumonia with septic shock. Diabetes team consulted for assistance in glycemic management.      Patient remains on the insulin infusion. Noted tube feeding changed to much higher carb formula today (155 gm CHO with Peptamen Intense --> 251 gm CHO with Nepro). He also continues to receive treatment for hyperkalemia with insulin and dextrose. Primary team would like patient to remain on the insulin infusion today. Discussed plan with the covering provider, N. Ciccia, PA  Given patient on continuous tube feedings, target blood glucose between 140mg /dl-180mg /dL mg/dL.    Recommendations:   1. Continue the insulin infusion per protocol  2. Continue glargine 15 units BID  3. Blood glucose monitoring per protocol  4. Diet: tube feeds per nutrition and primary team      Please let us know if you have any questions or concerns about our recommenations. We will continue to follow the patient along with you during his hospitalzation as we attempt to optimize his glycemic control.  Detailed recommendations were communicated to the primary team both verbally and/or through this written note.  I spent a total of 20 minutes with the patient of which 15 minutes (over 50%) was spent in counseling the patient regarding diabetes management and coordinating care with the primary team.     Signed:  Everlene Farrier, APRN  Inpatient Diabetes Management Team   [facility]: [phone]  Diabetes consult pager # [phone]      NOTE: During nights, weekends, and holidays, please contact the on-call Endocrine Fellow at pager [phone].

## 2020-11-05 NOTE — Other
[place] [name]    [facility]     Palliative Care Follow-Up Note    Present History   Chart reviewed and case discussed with SICU team. Today is Arthur [name] 55th birthday, and his sister [name] was at the bedside when I visited this afternoon. Arthur [name] appeared comfortable, no cues to agitation or distress. Opens his eyes spontaneously, does not appear to be tracking or following commands. He did appear to slightly smile intermittently in response to Arthur [name]'s voice. [name] reflected on some of their family history and her relationship with Arthur [name]. They reconnected only in the last couple years, when Arthur [name] moved back to [place] from [place] after separating from his wife. 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 She describes Arthur [name] as sweet and gentle. Their sister ([name]) died in 02-26-2000, and their parents are deceased, and [name] describes Arthur [name]'s supports as limited to her and his nieces and nephews. She shares how upset her son became upon learning that she changed Arthur [name] to DNR; however, she feels it would have been selfish not to change his code status and understands that he would be unlikely to survive a resuscitation attempt with any meaningful quality of life. She was appreciative of visit, continues to pray for further recovery.      Review of Allergies/Hx/Meds   Allergies  Patient has No Known Allergies.    Medical History  Past Medical History:   Diagnosis Date   ? Diabetes mellitus (HC Code) (HC CODE)    ? Hypercholesteremia    ? Hypertension        Family History  Family History   Problem Relation Age of Onset   ? Aneurysm Mother        Social History  Social History     Socioeconomic History   ? Marital status: Legally Separated     Spouse name: Not on file   ? Number of children: Not on file   ? Years of education: Not on file   ? Highest education level: Not on file   Occupational History   ? Not on file   Tobacco Use   ? Smoking status: Former Smoker     Packs/day: 1.00     Years: 30.00     Pack years: 30.00     Quit date: 03/04/2013     Years since quitting: 7.6   Substance and Sexual Activity   ? Alcohol use: Yes     Alcohol/week: 14.0 standard drinks     Types: 14 Cans of beer per week   ? Drug use: Not on file   ? Sexual activity: Not on file   Other Topics Concern   ? Not on file   Social History Narrative   ? Not on file     Social Determinants of Health     Financial Resource Strain:    ? Difficulty of Paying Living Expenses:    Food Insecurity:    ? Worried About Programme researcher, broadcasting/film/video in the Last Year:    ? Barista in the Last Year:    Transportation Needs:    ? Freight forwarder (Medical):    ? Lack of Transportation (Non-Medical):    Physical Activity:    ? Days of Exercise per Week:    ? Minutes  of Exercise per Session:    Stress:    ? Feeling of Stress :    Social Connections:    ? Frequency of Communication with Friends and Family:    ? Frequency of Social Gatherings with Friends and Family:    ? Attends Religious Services:    ? Active Member of Clubs or Organizations:    ? Attends Banker Meetings:    ? Marital Status:    Intimate Partner Violence:    ? Fear of Current or Ex-Partner:    ? Emotionally Abused:    ? Physically Abused:    ? Sexually Abused:        InPatient Medications  Current Facility-Administered Medications   Medication Dose Route Frequency Last Rate   ? acetaminophen  650 mg Per G Tube Q6H PRN     ? albuterol  2.5 mg Nebulization Q6H WA     ? ascorbic acid  250 mg Per G Tube Daily     ? chlorhexidine gluconate  15 mL Mouth/Throat Q12H     ? clonazePAM  0.5 mg Per J Tube BID     ? dexmedetomidine  0.2-0.7 mcg/kg/hr (Dosing Weight) Intravenous Continuous 0.7 mcg/kg/hr (11/05/20 1500)   ? dextrose  250 mL/hr Intravenous Continuous 250 mL/hr (11/05/20 1521)   ? insulin regular human 100 units in sodium chloride 0.9 % 100 mL   Intravenous Continuous 3 mL/hr at 11/05/20 1500    And   ? dextrose injection  25 g Intravenous Q15 MIN PRN      And   ? dextrose injection  12.5 g Intravenous Q15 MIN PRN     ? dextrose injection  25 g Intravenous Q1H PRN     ? dextrose injection  25 g Intravenous Q1H PRN     ? doxycycline hyclate  100 mg Per G Tube Q12H     ? famotidine  20 mg Per J Tube Daily     ? folic acid  1 mg Per G Tube Daily     ? glucagon  1 mg Intramuscular Once PRN     ? [Held by provider] heparin (porcine)  7,500 Units Subcutaneous Q8H     ? HYDROmorphone  1 mg IV Push Q3H PRN     ? insulin glargine  15 Units Subcutaneous Q12H     ? [Held by provider] insulin glargine  30 Units Subcutaneous Daily     ? [Held by provider] insulin regular human  9 Units Subcutaneous Q6H     ? LORazepam  0.5 mg IV Push Q6H PRN     ? miconazole nitrate   Topical (Top) BID     ? midazolam (PF)         ? oxyCODONE  10 mg Per G Tube Q4H PRN      Or   ? oxyCODONE  15 mg Per G Tube Q4H PRN     ? amino acids-protein hydrolys  1 packet Per G Tube TID     ? sodium bicarbonate  650 mg Per G Tube TID     ? thiamine  50 mg Per G Tube Daily     ? zinc oxide  1 Application Topical (Top) DAILY PRN         [email or web address]    Palliative Performance Scale 30%   (Modification of Karnofsky and was designed for measurement of physical status in Palliative Care. Only 10% of patients with PPS score <=50% would be expected to survive >  6 months)    Physical Exam  Vitals and nursing note reviewed.   Constitutional:       General: He is not in acute distress.     Appearance: He is ill-appearing.      Interventions: He is sedated.   Pulmonary:      Effort: No respiratory distress.      Comments: Trached to vent  Neurological:      Comments: Opens eyes spontaneously, not tracking, does not follow commands         Vitals:  Temp:  [98.6 ?F (37 ?C)-99.5 ?F (37.5 ?C)] 98.78 ?F (37.1 ?C)  Pulse:  [74-96] 83  Resp:  [14-30] 24  BP: (92-127)/(47-70) 93/54  SpO2:  [96 %-100 %] 98 %  ECOG Performance Status (non-calculated): 3  calc. ECOG (from Karnofsky): 2  Wt Readings from Last 3 Encounters:   11/05/20 (!) 148.2 kg   10/06/20 (!) 139.5 kg   09/29/20 (!) 142.4 kg       Review of Labs/Diagnostics  Complete Blood Count:  Recent Labs   Lab 11/03/20  1348 11/04/20  0516 11/05/20  0417   WBC 9.6 9.0 9.9   HGB 8.0* 8.0* 7.9*   HCT 26.90* 26.30* 25.80*   PLT 34* 34* 42*     Comprehensive Metabolic Panel:  Recent Labs   Lab 11/05/20  0417 11/05/20  0605 11/05/20  0802 11/05/20  0810 11/05/20  1305 11/05/20  1309 11/05/20  1508   NA 139  --  140  --  141  --   --    K 6.1*  --  6.0*  --  5.8*  --   --    CL 109*  --  110*  --  111*  --   --    CO2 18*  --  19*  --  18*  --   --    BUN 83*  --  84*  --  83*  --   --    CREATININE 2.12*  --  2.10*  --  2.07*  --   --    GLU 132*   < > 161*   < > 148*   < > 151*   CALCIUM 10.0  --  10.3*  --  10.0  --   --     < > = values in this interval not displayed.     No results for input(s): ALKPHOS, BILITOT, BILIDIR, PROT, ALT, AST in the last 168 hours.    Invalid input(s): LABALBU    Diagnostics:  No results found.      Impression/Plan:   Jahzir Strohmeier is a 55 y.o. 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 Admitted to SICU on 2/9 for ventilator management following RRT on oncology floor for desaturation and increased work of breathing while on trach mask, ultimately placed back on vent. Referred for Palliative Care Consult by Attending Provider: Alric Quan, MD [phone] for supportive care.    Coping/Support:   - Ongoing assessments for psychosocial and/or spiritual needs  - Full interdisciplinary team support available as needed    Advance Care Planning:  - DNR following family meeting of 2/25  - No advance directives or health care proxy documentation on file  - His sister and niece are listed as emergency contacts and his sister is next  of kin and default proxy decision maker in the event that patient lacks capacity    Goals of Care:   - Ongoing discussions. Family aware of concern for worsening condition and overall poor prognosis, do not want to see him suffer. Following most recent family meeting, they elected to make him DNR but otherwise continue with the current plan of care, as they believe this best represents what Sela Hua would choose for himself if he were able to verbalize his wishes.     Thank you for involving the Palliative Care team in the care of this patient. The Palliative Care team will continue to follow.    Patient was evaluated: in person    Time committed: > 35 minutes were spent in chart review, coordination of care, and at the bedside in patient/family counseling and education, including detailed concepts of symptom managment and goals of care/advanced care planning.    Signed:  Debbrah Alar, APRN  Palliative Care Consult Service  11/05/2020 3:45 PM

## 2020-11-05 NOTE — Progress Notes
Otolaryngology Progress NoteDate: 11/05/2020 Interim History- NAEO, attempted PSV trial though patient became agitated and was placed back on PCV- pending custom trachVitalsTemp:  [98.6 ?F (37 ?C)-100.58 ?F (38.1 ?C)] 98.96 ?F (37.2 ?C)Pulse:  [74-137] 78Resp:  [14-39] 24BP: (92-203)/(47-104) 114/64SpO2:  [87 %-100 %] 99 %Device (Oxygen Therapy): mechanical ventilator I/O last 3 completed shifts:In: 2635.4 [I.V.:630.4; NG/GT:2005]Out: 3550 [Urine:3300; Stool:250]Physical ExamGen: alert, vented PCEyes: open, normal external appearanceFace: grossly symmetricNose: clear anteriorlyOP/OC: hemostaticNeck: Bivona 8 cuffed secured w/soft trach ties, necrotic but hemostatic mass surrounding the stoma, remaining neck flat, firm tumor Pulm: no increased WOB on TMDiagnostic DataReviewed, per chartAssessment & Plan54 M with PMH of HTN, HLD, DM, tobacco and EtOH use, Waldenstrom macroglobulinemia, 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 Admitted to SICU for ventilatory support. No acute ENT intervention planned; no curative or meaningful surgical options at this time. Course complicated by multiple episodes of Bivona trach occlusion/obstruction.  - needs custom Bivona trach- no ENT intervention planned at this time; case discussed at tumor board 2/14 and is not a surgical candidate - disease management/goals-of-care best pursued via non-surgical means; eg, Medical Oncology, Radiation Oncology, Palliative Care- transfer to Medical Oncology floor when eligible - continue routine trach care with careful suctioning- ensure trach well secured with tiesElectronically Signed ZO:XWRUE Floree Zuniga DDSContact: MHB3/3/20227:06 AM

## 2020-11-05 NOTE — Plan of Care
Plan of Care Overview/ Patient Status    This AM, sedation weaning started with initially good results.  PRN pain meds given and Propofol able to be weaned effectively with an order for Precedex in place if needed.  During RN lunch, patient became acutely agitated, tachycardic to 150s, hypertensive to 200 SBP, desat to 80s, RR 40s with noticeable increased WOB and accessory muscle use.  Propofol increased as well as bolus given per provider at bedside.  RT at bedside also and pt placed back on resting settings with increase in FiO2.  Precedex gtt started, pain meds given.  At this time, Propofol is off, Precedex is at current max dose, pain meds being given PRN.  RASS is still -1 but WOB is slightly increased from patient's baseline. Team aware, no interventions required for now.  Restraints in place to protect airway.  HR/BP WNL, afebrile.  1st breathing trial aborted due to acute agitation and respiratory decomp, 2nd breathing trial not done this evening per attending MD.  Suction required approx q2h for large amount thin tan secretions.  Trach care performed.  TF at goal with minimal residual.  Rectal tube with liquid stool 250 mL.  Excoriation to buttocks with open areas, Secura cream applied.  Insulin gtt per protocol.  No family at bedside or via telephone this shift.

## 2020-11-05 NOTE — Plan of Care
Plan of Care Overview/ Patient Status    Pt remains sedated on precedex gtt, wakes up to voice, following commands, less anxious, prn dilaudid iv given for pain control, VSS, rested on pressure AC overnight, unchanged respiratory status, continue TF, scant liquid stool from RT, large urine from [facility], continue insulin gtt per protocol, labs noted, K elevated at 6.1, Calcium, dextrose w/ insulin given, skin care/trache care done

## 2020-11-05 NOTE — Plan of Care
[facility]		Location: 6 SICU/7112-A55 y.o., male			Attending: Alric Quan, MD	Admit Date: 10/09/2020		[id number] LOS: 27 days FOLLOW UP NUTRITION ASSESSMENT DIET ORDER: Tube Feed With Tray --> Peptamen VHP @ 85 mL/hrANTHROPOMETRICS:Height: 74Admit wt: 136.2 kg, BMI: 38.6 kg/m^2Current wt: 148.2 kg, BMI: 42 kg/m^2Dosing wt (IBW): 95 kgAdditional wt information:Ht confirmed with pt: yes. Based on EMR wt hx, pt down 8.4 kg (5.8%) over the past three months, not considered significant for time frame. Fluctuations in wt may be r/t fluid status vs scale variance. Overall upward trend during admission. Per flowsheets, 1-2+ (trace-mild) edema present. Will continue to follow. Wt hx with the lowest wt from each available month over the past year: 09/22/20: 137.8 kg12/01/21: 143.2 kgESTIMATED NUTRITION REQUIREMENTS:Kcal/day: 2850	(30 kcal/kg)Protein/day: 162	(1.7 gm/kg) Fluids/day: Fluid management per medical team discretion. ~2580 mL (30 mL/kcal) Needs based on: dosing wt (95 kg), ICU, obesity, CA w/mets, AKI/CKD, PNA, shock off pressorNUTRITION ASSESSMENT: Chart reviewed for d/c of propofol. Pt seen trached and sedated (off propofol). TF infusing @ 85 mL/hr (goal rate). Pt has received 99% of goal enteral volume since last assessment. Hyperkalemia now present. New recommendations outlined below. DM team on board for insulin management. D/w team who will adjust orders. DM team notified of change. Rectal tube placed on 2/28. Nutrition will continue to follow throughout admission. Cultural/Religious/Ethnic Needs: NoFood Allergies: None per pt NUTRITION DIAGNOSIS:Inadequate oral intake related to medical status as evidenced by NPO. --- continues INTERVENTIONS/RECOMMENDATIONS: Meals and Snacks:- Diet advancement per team/SLP --> currently NPOGoal: Patient will have diet advanced or remain on nutrition support prior to next nutrition assessment. --- met, continuesEnteral Nutrition:- Nepro @ 65 mL/hr x 24 hrs - Bolus 1 ProSource TID- Total provides: 2988 kcal, 171 gm protein, 251 gm CHO (10.5 gm/hr), and 1134 mL free H2O- Additional free water boluses per team Goal: Patient will receive >90% of goal volume TF prior to next nutrition assessment. --- met, continuesDischarge Planning and Transfer of Nutrition Care:  Nutrition related discharge needs still being determined at this time, will continue to follow.MONITORING / EVALUATION:Food/Nutrition-Related History:Food and Nutrient IntakeEnteral Nutrition IntakeAnthropometric Measurements:Weight Biochemical Data, Medical Tests and Procedures:Electrolyte and renal profileGlucose/endocrine profileNutrition-Focus Physical FindingsOverall findingsElectronically signed by Suzie Portela, MS, RD, CNSC, CDN on March 3, 2022M[facility]: [phone] note: Nutrition is a consult only service on weekends and holidays.  Please enter a consult in EPIC if assistance is needed on a weekend or holiday or via [facility] [phone] to contact the covering RD.

## 2020-11-05 NOTE — Progress Notes
[place] [facility]	SICU Progress NoteAttending Provider: Alric Quan, MDPost-Operative Day/Post Injury Day Procedure(s):2/5: [name] exchange (ENT) Hospital LOS: 27 days Interim History:  HPI: 55 y.o. male admitted to the SICU on 2/9 for ventilator management following an RRT on the oncology floor for desaturation and increased work of breathing while on trach mask and ultimately placed back on vent. ?Patient was initially a transfer from [facility] on 2/4 after trach and PEG when he had a partial obstruction of trach site from a new necrotic mass. He underwent trach exchange 2/5. Was on trach mask on oncology floor however had 2 episodes on floor requiring RRT for lavage and likely mucous plugging. ?Course Complicated by: - Incidental R IJ thrombus - Multiple episodes of presumed mucous plugging requiring RRT's ?PMH: Chronic ITP, Waldenstrom macroglobulinemia, HTN. HLD, DM, tobacco and alcohol use, Laryngeal SCC (weekly Cisplatin and RT)?PSH: None known ?Home meds: Lipitor, Pepcid, Lisinopril, Metformin, Oxycodone, miralax, Insulin U100 and Lantus SICU course:2/9: Arrives to SICU HDS, on vent PCV. Appears well. Transitioned to Pressure support on vent and tolerating well. O/N: Episode of patient feeling like not getting a full breath, extremely tachypenic and increased work of breathing and tachycardia, attempted suctioning and lavage without improvement, attempted to switch to PCV and VAC but due to dyssynchrony unable to tolerate, no desat but poor ventilation and TV and ETCO2 up to 100. ENT at bedside do not feel additional scope would benefit as he was just scoped a few hours ago, ultimately gave fentanyl push and started propofol for vent synchrony but despite max prop still dyssynchronous. Gave one dose of roccuronium once sedated for vent synchrony with good effect. Albuterol and HTS neb given and peaks improved from 50 to 30's. No further paralytic needed and now synchronous on VAC. Kept on propofol for vent synchrony. CXR reassuring and back to GCS 11T. Hypotension only following sedation and Rocc, Low dose Neo started. 2/10: Kept on higher sedation in AM for [place] A/P (for staging) and soft tissue neck. Weaning sedation and will plan for PSV trial once more awake. Scoped by ENT in AM, airway patent. Q1-2hr suction requirements of thick tan secretions. No further episodes of respiratory distress although sedation remains high. Tube feeds changed to continuous given significant hyperglycemia. Insulin regimen changed per diabetes team recommendations. Pressure ulceration noted around tracheostomy --> ENT at bedside to evaluation, likely some component of invasive tumor growth. Palliative care consult for assist with symptom management and family moving forward. Primary oncologist office made aware of referral. O/N: weaning down on Prop, no episodes of respiratory distress or desats overnight. Febrile to 101.3, sputum and blood cultures ordered. 500cc LR bolus given for some oliguria and rising Cr. 2/11: Given 500cc NS for oliguria with good response. Vanco level yesterday 25, likely cause of worsening aki, on hold. UOP & lytes remains stable. Insulin adjusted per diabetes team. PSV as tolerated (doing well). CPT added. Confirmed with ENT pulm recs for blue portex suctionaid trach- no plans to switch at this time. Bowel regimen added. Plan keep on neo if low dose but if inc, consider changing to levo as likely then not in setting of sedation. Family updated on phone. Confirmed w heme- no AC rec for R IJ thrombus until plt > 50. Was going to attempt to place pt on TM overnight as he did very well on PSV x 3hrs, however, pt had anxiety attack regarding his cancer and prognosis, he requested to sleep during night and was agreeable to TM in  am. O/N: Patient agrees to PSV overnight which he did very well on. Agreeable to getting OOB to chair and possibly trach mask in AM but wants to stay on vent tonight. Off Neo by AM.  2/12: trial trach mask as tolerated this AM. Adjusted insulin regimen per the diabetes management service. Sputum now with staph, awaiting sensitivities. More loose stools today; nursing placed rectal tube and held bowel regimen.  ON: Anxious at times, feeling like he cannot breath but doing well on TM. Frequent suctioning. Increased standing. FIO2 increased to 40%.  insulin to 14U per endo reccs. 2/13: continue to adjust insulin regimen, now 20u regular q6. Cr continues to uptrend 2.57 --> 2.63 and repeat vanco level still elevated 24.6; pharmacy to continue dosing adjustments. Otherwise, remains on trach mask. Sputum with MRSA --> d/c ceftriaxone. ON: increased standing insulin to 24U. 2/14: Will discuss [facility] plan with hematology now that PLT > 50 x2 checks. Will also d/c vancomycin today (10d of therapy thus far). AKI appears to have peaked at 2.57 -> 2.63 -> 2.47. Awaiting transfer to the oncology service on NP 15. Okay to start [facility], but will hold off today given more bloody secretions. 2/15: Plan to start palliative RT today. AM session aborted due to extreme anxiety (despite premedication with IV ativan), tried again in the PM with 10mg  IV Valium + 3mg  IV ativan, procedure again aborted due to extreme anxiety and inability to tolerate. Pending discussion with rad onc in AM re: utility of ongoing attempts at RT. Pending transfer to NP15 on med onc service.  O/N: increasingly anxious / restless, but able to re-direct and calm down with PRN ativan. Liquid stools, placed rectal tube. Will start banatrol and check CBC in AM, check c dif. This had previously improved by holding bowel regimen.2/16: Acutely increased WOB, HR 130s, SBP 170s --> ENT bedside scope with trach patent, though notable secretions --> suctioned with minimal improvement; placed back on ventilator PSV 10 / +5 with immediate improvement in VS and respiratory distress. Precedex gtt started to assist more frequent suctioning. CXR with LLL consolidation --> resend sputum, and restart Vanco (close trough monitoring with AKI) given known MRSA PNA. Increased diarrhea, cdiff negative, transition to Vital TF. LR 500cc for hemoconcentration of labs, tachycardia. Appreciate radiation oncology; sedated patient with Propofol gtt, bolus x1 with good effect --> able to tolerate 15 minute radiation session; plan to repeat Thursday, Friday, Monday, and Tuesday. 2/17: Psych consulted for depression--> Doxepin added, Klonopin added. Hypernatremia--> D5W and free water flushes added. Ceftaz added for GNR in sputum. Plan for family discussion this PM (see separate note) and radiation this PM. Foley replaced for retention. ON: Tolerated PSV 15/5. Repeat Na 148.2/18: Plan for radiation this afternoon. Rectal tube removed.. Hgb 6.3--> transfused 1un PRBCs. Discussed care with sister and niece at bedside, remain full code and plan to continue radiation course through Tuesday (see family communication note).2/19: PSV weaned 15 --> 12/+5. Start Clonidine and discontinue Precedex gtt. Discontinue Gabapentin and Dilaudid IV, decrease Oxycodone to 2.5 / 5. Na improved 144, discontinue D5W; sugars should improve, maintain current regimen per DM team. GNR per micro lab not pseudomonas, klebsiella, or other enteric organism; would have speciated --> change Ceftaz to PO Cipro, to complete course 2/23 for one week. Cr again increased with Vanco level over 20 --> next dose held, monitor level closely. Hgb remains low despite 1U yesterday, 6.3 --> 7.0; start multivitamins and check with Heme. ON: Low grade fevers stable. Given ativan  0.5mg  in place of klonipin with good anxiolytic effect. Transfuse 1u PRBC (likely half unit) for Hgb 6.7.2/20: Acute respiratory distress this morning after turn, minimal secretions, did not improve with increased pressure or VAC --> bagged x5 mins with improvement, placed on PCV, IP 20 --> CXR pending, start Propofol, add back Ativan and Morphine PRN. Appreciate heme assistance with persistent anemia. Cr continues to rise following elevated vanco trough; hold any further doses, level will likely remain therapeutic to final date of course (2/22) --> unfortunately, patient did receive additional dose of Vanco 2g per pharmacy order this morning; discontinued placeholder, consider Doxycycline if Vanco level subtherapeutic prior to end date.ON: Remains on PCV. Remained on low dose levo 0.02 throughout night. Manual suctioning by RT overnight with thin tan secretions. AM labs notable for Hgb 9.1(6.7) after 1uPRBC transfusion yesterday. Worsening AKI Cr 2.51 (2.44).2/21: Patient alert and breathing comfortably this morning --> wean PEEP back to 5, consider retrial of PSV today pending radiation plans. Free water flushes discontinued, hypernatremia resolved. Worsening AKI from Vanco; since discontinued, monitor UOP. LR 250cc, wean off Levo gtt. Appreciate palliative care assistance after event yesterday, patient requesting further discussion. Lantus changed to 15U QHS, 10U QAM and bolus increased to 28U Q6.ON: Acute respiratory distress at change of shift, lost tidal volumes and desaturated to high 80s requiring bagging, recovered. Pt expressing that he cannot breathe, feels obstructed --> suctioned with 10Fr suction w/ resistance met at 20cm and minimal secretions. ENT at bedside for scope --> thick secretions noted but airway patent.. Noted that Bivona trach was retracted, advanced to proper position by ENT. Per ENT, tracheal tissue proximal to bivona tip very flimsy. Likely tissue collapsing against distal tip with trach retracted in poor position causing obstruction. Patient acutely distressed and agitated during event, propofol gtt restarted will plan to wean off tonight. Start decadron 8mg  q12 per onc recs for peritumoral inflammation. Hyperkalemic in AM to 5.7 --> EKG unchanged, insulin/dextrose/Ca2+ x 1. Transfuse 1u PRBC for hgb 6.9. 2/22: Keep on PCV today and continue propofol gtt. Start Insulin gtt and increase Lantus to 20u BID. No bowel movements --> discontinue banatrol. Plan for radiation today. Discussed transfer to MICU no beds available, will initiate transfer once a bed becomes available. Plan for multidisciplinary family meeting this week. ON: Rested on PCV, low dose prop. WBC count 12 (7) this am2/23: Plan for last palliative radiation session later today. RR decreased to 12. Given rising white count and increased in line suctioning requirement Sputum culture sent and CXR obtained. Will stop Cipro and start ceftriaxone. 1L LR bolus given for AKI, rising BUN as well as likely concentration on CBC. Plan to start weaning off prop after radiation and attempt PSV trials again. Family discussion with all teams Thursday or Friday. CPT added back given increased secretions attempted to add back HTS nebs however hospital is completely out per pharmacy discussion. Respiratory culture again growing GPC and GNR. ON: BG stable on ins gtt. NAE2/24: This AM, less responsive (GCS 10T) and BP down to 60/40s, but also worsening vent synchrony --> increased oxycodone and dilaudid followed by 1000 LR bolus with good effect and pan-cx given worsening leukocytosis. Started vanc / zosyn.  Later, self d/c bivona trach and desat to 60s; SICU attending at bedside and replaced bivona trach without difficulty and placed back on the ventilator.  ENT at bedside to scope and confirmed trach positioning. Will plan for family meeting tomorrow. ON: NAE2/25: GCS 11T, but still withdrawn / lethargic. Will  d/c PRN ativan to minimize sedation and continue to wean propofol as tolerated, though patient has been mentating on higher doses than this. Still low dose levo requiremements. Bivona trach dislodged ~4cm today, obturator placed and re-advanced without issue. Trial PSV 15/5, tolerating with Tv 400s. D/w ENT, will try to work on custom trach for him. Plan for family meeting this afternoon for goals of care discussions. Rested on [facility] settings after ~6 hours of 15/5 (work of breathing + tachypnea 40s). Decreased lantus to 15 BID per endocrine. In afternoon, became more agitated / tachy requiring additional PRN 1mg  ativan + 1mg  versed + dilaudid. See ACP note for family meeting discussion. O/N: NAE. 2/26: Attempt to wean propofol to 35, increased agitation and tachypnea --> increase Prop back to 40 and oxycodone given with improvement. Plan for PSV 15/5 x 4 hours today as tolerated. Increased Lantus to 20u BID. Discontinued Zosyn. Discussed with pharmacy switching Vanco to Clinda given worsening renal function --> recommend continuing Vanco as high risk of C.diff with Clinda, will recheck random vanco level tomorrow @ 0500. 1L NS given for rise in creatinine. O/N: Vanc trough 21.4. 2/27: Plan for PSV 15/5 x 6 hours as tolerated. 1L LR given for high insensible losses. Change Vanco to PO Doxy. Increase Lantus to 30u BID, d/c insulin gtt and start HDISS.  O/N: K 5.7 --> insulin/dextrose/Ca given. 2/28: Remains on Levo. K 5.7--> 5.9--> Insulin/ Dextrose/ Ca x given x2 and Lokelma x1. Hyperglycemic--> Lantus 30 daily, HDISS, Regular Insulin 9un Q6hours. Decreased IP to 18, tolerating with TV 400s-500s. Heme consulted for ongoing thrombocytopenia, low concern for HIT, no plan to treat ITP unless plt<30k or bleeding.  O/N: At shift change insulin Gtt restarted, Significant stool output after lokelma, K improving to 5.4, will trend. AM labs with Plt 35, HSQ held following heme reccs. 3/1: Arthur [name] noted to be dislodged, re-advanced and ENT consulted with no acute interventions. CXR showing trach in stable position. Decreased IP to 15. Will trial on PSV 15/5 x6hours BID. Afternoon plt stable at 34; will continue to hold HSQ. K stably elevated at 5.5; no acute intervention given insulin gtt and large rectal tube output. Levo weaned off. O/N: Rested on Pressure control after 6 hours PSV. NAE. Repeat plt stable at 34. 3/2: Weaning prop and transitioning to dex to decrease sedation and aid in anxiety/agitation management. Will trial on PSV 10/5 x6hr BID today, resting on PCV. Psych re-engaged due to withdrawn affect/mood this AM; d/ced doxepin to avoid further delirium/AMS. Acutely tachycardic, tachypneaic, and hypoxic w/ O2 sat in 80's on PSV trial --> prop gtt increased and placed back on PCV with IP 12. Weaned off prop, on dex gtt. ON: Rested on PCV. K 6.1 --> 1g Ca, insulin and dextrose given, EKG obtained.3/3: Potassium 6.0 --> insulin/dextrose, Lokelma 10g x 1 given. Held off on further calcium as serum calcium 10.3 and corrected Calcium is ~11.6. Bicarb 19 --> ordered Na Bicarb tabs 650mg  TID. 500cc NS x 1  Given for goal even to +500. Tube feeds changed to Nepro. Trial of PSV 5/5 --> last ~ then became tachypneic and put back on 10/5. Tolerated 10/5 x 6 hours, rested on PCV. Repeat potassium 5.8 --> 5u insulin/dextrose. Will plan for additional dose of Lokelma at 6pm. Placed on second 110.5 PSV trial ~6pm, plan for 6hrs as tolerated. Review of Allergies/Meds/Hx: I have reviewed the patient's: allergies, past medical history, past surgical history, family history, social history, prior to admission medicationsObjective: Vitals:I  have reviewed the patient's current vital signs as documented in the EMRTemp:  [98.6 ?F (37 ?C)-100.58 ?F (38.1 ?C)] 98.6 ?F (37 ?C)Pulse:  [74-137] 74Resp:  [19-39] 21BP: (92-203)/(45-104) 93/49SpO2:  [87 %-100 %] 100 %SpO2: 100 %Device (Oxygen Therapy): mechanical ventilatorI/O's:I have reviewed the patient's current I&O's as documented in the EMR.Gross Totals (Last 24 hours) at 11/05/2020 0038Last data filed at 11/05/2020 0000Intake 2746.1 ml Output 3490 ml Net -743.9 ml Physical ExamConstitutional:     General: Anxious and withdrawn gentleman appearing older than stated age. HENT:    Head: Normocephalic and atraumatic.    Mouth: Mucous membranes are moist.    Extraocular Movements: Extraocular movements intact.    Pupils: Pupils are equal, round, and reactive to light.    Comments: Tracheostomy in placeCardiovascular:    Rate and Rhythm: Regular rhythm. Regular rate   Heart sounds: No murmur heard. Pulmonary:    Breath sounds: No wheezing, rhonchi or rales.    Comments: Ventilated via trachAbdominal:    General: There is no distension.    Tenderness: There is no abdominal tenderness.    Comments: PEG tube in place with TFs runningGenitourinary:   Comments: Foley in place draining clear, yellow urineMusculoskeletal:    Lower legs: Mild edema   Comments: Moves all extremities equally, to command.Skin:   General: Skin is warm and dry.    Capillary Refill: Capillary refill takes less than 2 seconds. Neurological:    General: No focal deficit present.    Mental Status: He is alert.    Cranial Nerves: No cranial nerve deficit.    Motor: No weakness. Labs: Refer to EMR. I have reviewed the patient's labs within the last 24 hours; see assessment and plan below for significant abnormals.Microbiology:Refer to EMR. I have reviewed all new results within the last 24 hours; see assessment and plan below.Diagnostics:Refer to EMR. I have reviewed all new results within the last 24 hours; see assessment and plan below.Chest ZO:XWRUE to EMR. I have reviewed all new results within the last 24 hours; see assessment and plan below.Abdominal AV:WUJWJ to EMR. I have reviewed all new results within the last 24 hours; see assessment and plan below.ECG/Tele Events: I have reviewed the patient's ECG and telemetry as resulted in the EMR.Assessment/Plan Neurologic: Analgesia/sedation - Tylenol 650 Q6 PRN - Oxycodone 10 / 15 Q4 PRN - Dilaudid 1 PRN breakthrough- Precedex gtt Anxiety/depression- s/p clonidine taper (stopped early 2/2 hypotension)- Clonazepam 0.5mg  BID, consider d/c if mental status continues to be depressed- Ativan 0.5mg  IV Q6 PRN acute agitation- Appreciate palliative care and psych consults?ENT: L laryngeal SCC, invading tracheostomy: s/p trach exchange 10/10/20 and biopsy 10/12/20- Bivona trach to bypass obstruction 	- F/u with ENT re: custom Bivona trach- Unfortunately, there are no surgical / curative options- Palliative radiation (2/16, 2/17, 2/18, 2/21, 2/22, 2/23)	- Required PCV with full propofol sedation to tolerate?Cardiac: Septic shock- s/p Norepinephrine gtt(2/24- 3/1) to maintain MAP>65Hx of HTN / HLD- Hold home lisinopril, lasix, crestor given AKI?Respiratory:  Respiratory failure (circumferential tumor invasion of stoma, mucous plugging / trach occlusion)- PC/AC: IP 12, PEEP +5- PSV 10/5 x 6 hours, weaning IP as tolerated - s/p Decadron 8mg  BID for tumor inflammation (2/22 -2/24) per med oncology- Albuterol Q6- Could consider mucomyst nebsGI: Diarrhea, resolved - PEG in place with TF: Vital @ 95cc/hr; while on propofol --> peptamen at 85cc/hr- Pepcid while vented?Renal: AKI on CKD (baseline Cr 1.5-1.6), 2/2 supratherapeutic vancomycin level- Cr continues to rise, continue to trend daily- Foley replaced 2/17 for urinary retentionHyperkalemia:-  medical management with Insulin/ Dextrose- Trend lytes- Lokelma if needed (can give up to TID) Hypercalcemia- Corrected Calcium >11- Avoid further Calcium repletion at this time Infectious Disease:  Concern for new pneumonia- 2/24 sputum: GNR- 2/23 sputum: MRSA- Vanco  (2/24- 2/27), de-escalate to Doxy (2/27 - 3/5) plan 10d course - S/p Zosyn (2/24- 2/26)Hx MRSA PNA; 4+ GNR PNA- s/p Vanco (2/5 - 2/14, 2/16 - 2/22)- s/p Ceftaz (2/17- 2/19)- s/p Cipro (2/19 - 2/23)?Hematology:Critical illness anemia- Sanguinous secretions, expected given tumor burden and coagulopathy, may worsen with radiation and baseline hematologic disease- Multivitamins and cofactors- Maintain Hgb >7?Hx of chronic ITP, Waldenstrom macroglobulinemia- Transfuse platelets if <30k, bleeding, or need for invasive procedure per heme- no need to treat ITP unless plts<30k- would hold Hep SC if plts<50k- Appreciate hematology recommendations?R IJ thrombus - High risk heparin gtt when plt consistently >50 and no more bloody secretions from tracheostomy- HSQ- Maintain SCDs at all times?Endocrine: Hx T2DM- Insulin Gtt restarted 2/28- Lantus 15 units BID- Hold Regular insulin 9un SC Q6hours- Hold HDISS- HOLD home Metformin, Lantus 64u, Apidra 20u TID w meals- Appreciate diabetes team recsMusculoskeletal: Generalized deconditioning- OOB daily, PT/OT evals- Turn and reposition Q2 hours, PUP dressing?Device Review:- R IJ Port (08/12/20) per guidelines, nonocclusive DVT does not require removal of line- PEG (09/14/20)- Bivona trach (10/10/20)?Code Status / Dispo:- Transition to DNR (see ACP note 2/25)- Per ENT, patient to transfer to medical oncology after SICU- Appreciate palliative care assistance with GOC discussionsNotifications : For questions please call SICU Covering Provider in Epic EMR (primary) SICU M[facility] Dynamic Roles (secondary)Y[facility] 7-1 APP			[phone] 6-1 Frontside		[phone] 6-1 Backside		[phone] [place]di-2 APP			[phone]:Christiana Gurevich, PA-C SICU APP3/11/2020

## 2020-11-06 ENCOUNTER — Inpatient Hospital Stay: Admit: 2020-11-06 | Payer: PRIVATE HEALTH INSURANCE

## 2020-11-06 ENCOUNTER — Encounter: Admit: 2020-11-06 | Payer: PRIVATE HEALTH INSURANCE

## 2020-11-06 DIAGNOSIS — J9601 Acute respiratory failure with hypoxia: Secondary | ICD-10-CM

## 2020-11-06 LAB — BASIC METABOLIC PANEL
BKR ANION GAP: 10 (ref 7–17)
BKR ANION GAP: 10 (ref 7–17)
BKR BLOOD UREA NITROGEN: 86 mg/dL — ABNORMAL HIGH (ref 6–20)
BKR BLOOD UREA NITROGEN: 88 mg/dL — ABNORMAL HIGH (ref 6–20)
BKR BUN / CREAT RATIO: 41.3 — ABNORMAL HIGH (ref 8.0–23.0)
BKR BUN / CREAT RATIO: 43.1 — ABNORMAL HIGH (ref 8.0–23.0)
BKR CALCIUM: 9.9 mg/dL (ref 8.8–10.2)
BKR CALCIUM: 9.5 mg/dL (ref 8.8–10.2)
BKR CHLORIDE: 108 mmol/L — ABNORMAL HIGH (ref 98–107)
BKR CHLORIDE: 110 mmol/L — ABNORMAL HIGH (ref 98–107)
BKR CO2: 19 mmol/L — ABNORMAL LOW (ref 20–30)
BKR CO2: 20 mmol/L (ref 20–30)
BKR CREATININE: 2.08 mg/dL — ABNORMAL HIGH (ref 0.40–1.30)
BKR CREATININE: 2.04 mg/dL — ABNORMAL HIGH (ref 0.40–1.30)
BKR EGFR (AFR AMER): 40 mL/min/{1.73_m2} (ref >60)
BKR EGFR (AFR AMER): 41 mL/min/{1.73_m2} (ref >60)
BKR EGFR (NON AFRICAN AMERICAN): 33 mL/min/{1.73_m2} (ref >60)
BKR EGFR (NON AFRICAN AMERICAN): 34 mL/min/{1.73_m2} (ref >60)
BKR GLUCOSE: 157 mg/dL — ABNORMAL HIGH (ref 70–100)
BKR GLUCOSE: 156 mg/dL — ABNORMAL HIGH (ref 70–100)
BKR POTASSIUM: 5.8 mmol/L — ABNORMAL HIGH (ref 3.3–5.3)
BKR POTASSIUM: 5.6 mmol/L — ABNORMAL HIGH (ref 3.3–5.3)
BKR SODIUM: 137 mmol/L (ref 136–144)
BKR SODIUM: 140 mmol/L (ref 136–144)

## 2020-11-06 LAB — IMMATURE PLATELET FRACTION (BH GH LMW YH)
BKR WAM IPF, ABSOLUTE: 5.2 x1000/ÂµL (ref <20.0)
BKR WAM IPF: 7.7 % (ref 1.2–8.6)

## 2020-11-06 LAB — C-REACTIVE PROTEIN     (CRP): BKR C-REACTIVE PROTEIN, HIGH SENSITIVITY: 143.6 mg/L — ABNORMAL HIGH

## 2020-11-06 LAB — CBC WITH AUTO DIFFERENTIAL
BKR WAM ABSOLUTE IMMATURE GRANULOCYTES.: 0.49 x 1000/ÂµL — ABNORMAL HIGH (ref 0.00–0.30)
BKR WAM ABSOLUTE LYMPHOCYTE COUNT.: 0.25 x 1000/ÂµL — ABNORMAL LOW (ref 0.60–3.70)
BKR WAM ABSOLUTE NRBC (2 DEC): 0 x 1000/ÂµL (ref 0.00–1.00)
BKR WAM ANALYZER ANC: 9.14 x 1000/ÂµL — ABNORMAL HIGH (ref 2.00–7.60)
BKR WAM BASOPHIL ABSOLUTE COUNT.: 0.04 x 1000/ÂµL (ref 0.00–1.00)
BKR WAM BASOPHILS: 0.4 % (ref 0.0–1.4)
BKR WAM EOSINOPHIL ABSOLUTE COUNT.: 0.29 x 1000/ÂµL (ref 0.00–1.00)
BKR WAM EOSINOPHILS: 2.6 % (ref 0.0–5.0)
BKR WAM HEMATOCRIT (2 DEC): 25.2 % — ABNORMAL LOW (ref 38.50–50.00)
BKR WAM HEMOGLOBIN: 7.9 g/dL — ABNORMAL LOW (ref 13.2–17.1)
BKR WAM IMMATURE GRANULOCYTES: 4.4 % — ABNORMAL HIGH (ref 0.0–1.0)
BKR WAM LYMPHOCYTES: 2.2 % — ABNORMAL LOW (ref 17.0–50.0)
BKR WAM MCH (PG): 27.4 pg (ref 27.0–33.0)
BKR WAM MCHC: 31.3 g/dL (ref 31.0–36.0)
BKR WAM MCV: 87.5 fL (ref 80.0–100.0)
BKR WAM MONOCYTE ABSOLUTE COUNT.: 0.91 x 1000/ÂµL (ref 0.00–1.00)
BKR WAM MONOCYTES: 8.2 % (ref 4.0–12.0)
BKR WAM MPV: 12.5 fL — ABNORMAL HIGH (ref 8.0–12.0)
BKR WAM NEUTROPHILS: 82.2 % — ABNORMAL HIGH (ref 39.0–72.0)
BKR WAM NUCLEATED RED BLOOD CELLS: 0 % (ref 0.0–1.0)
BKR WAM PLATELETS: 68 x1000/ÂµL — ABNORMAL LOW (ref 150–420)
BKR WAM RDW-CV: 17.4 % — ABNORMAL HIGH (ref 11.0–15.0)
BKR WAM RED BLOOD CELL COUNT.: 2.88 M/ÂµL — ABNORMAL LOW (ref 4.00–6.00)
BKR WAM WHITE BLOOD CELL COUNT: 11.1 x1000/ÂµL — ABNORMAL HIGH (ref 4.0–11.0)

## 2020-11-06 LAB — MAGNESIUM
BKR MAGNESIUM: 2.6 mg/dL — ABNORMAL HIGH (ref 1.7–2.4)
BKR MAGNESIUM: 2.3 mg/dL (ref 1.7–2.4)

## 2020-11-06 LAB — PHOSPHORUS     (BH GH L LMW YH)
BKR PHOSPHORUS: 4.7 mg/dL — ABNORMAL HIGH (ref 2.2–4.5)
BKR PHOSPHORUS: 4.4 mg/dL (ref 2.2–4.5)

## 2020-11-06 LAB — PREALBUMIN: BKR PREALBUMIN: 11.9 mg/dL — ABNORMAL LOW (ref 20.0–40.0)

## 2020-11-06 LAB — POTASSIUM: BKR POTASSIUM: 6 mmol/L — CR (ref 3.3–5.3)

## 2020-11-06 MED ORDER — DEXTROSE 10 % IV BOLUS FOR ORDERABLE
INTRAVENOUS | Status: DC | PRN
Start: 2020-11-06 — End: 2020-11-06

## 2020-11-06 MED ORDER — SODIUM ZIRCONIUM CYCLOSILICATE 10 GRAM ORAL POWDER PACKET
10 gram | Freq: Once | ORAL | Status: CP
Start: 2020-11-06 — End: ?
  Administered 2020-11-06: 08:00:00 10 gram via ORAL

## 2020-11-06 MED ORDER — INSULIN U-100 REGULAR HUMAN 100 UNIT/ML INJECTION SOLUTION
100 unit/mL | Freq: Once | INTRAVENOUS | Status: CP
Start: 2020-11-06 — End: ?
  Administered 2020-11-06: 08:00:00 100 mL via INTRAVENOUS

## 2020-11-06 MED ORDER — DEXTROSE 10 % IV BOLUS FOR ORDERABLE
Freq: Once | INTRAVENOUS | Status: CP
Start: 2020-11-06 — End: ?
  Administered 2020-11-06: 08:00:00 via INTRAVENOUS

## 2020-11-06 MED ORDER — SODIUM BICARBONATE 650 MG TABLET
650 mg | Freq: Three times a day (TID) | GASTROSTOMY | Status: DC
Start: 2020-11-06 — End: 2020-11-09
  Administered 2020-11-06 – 2020-11-09 (×8): 650 mg via GASTROSTOMY

## 2020-11-06 MED ORDER — HYDROMORPHONE 2 MG/ML INJECTION SOLUTION
2 mg/mL | INTRAVENOUS | Status: DC | PRN
Start: 2020-11-06 — End: 2020-11-18
  Administered 2020-11-07 – 2020-11-18 (×35): 2 mL via INTRAVENOUS

## 2020-11-06 NOTE — Progress Notes
Surgical Intensive Care Unit  AttendingI have personally performed a face to face diagnostic evaluation on this patient, reviewed the chart, available data and care plan and have personally formulated the plan outlined below. ?I have seen this patient in addition to the Surgical ICU advanced practitioner given patient's complexity and associated need for critical care.??54 y.o.?male?admitted to the surgical intensive care unit on 10/14/2020 for?on?10/09/2020?for respiratory failure. 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 ?The patient's hospital course was significant for a knee tracheostomy exchange 10/10/20, complicated by episodes of hypoxia requiring suctioning of mucus plugs and transferred to the surgical intensive care unit on 10/14/2020. ?The patient received palliative radiation treatments, completed on 10/28/20.??His hospital course has been notable for altered mental status and decreased responsiveness, continued vasopressor requirement, pressure support trials, ongoing antibiotic treatment for possible aspiration pneumonia.Hospital Day 28 Past Medical History: Diagnosis Date ? Diabetes mellitus (HC Code) (HC CODE)  ? Hypercholesteremia  ? Hypertension  No past surgical history on file.Scheduled Meds:Current Facility-Administered Medications Medication Dose Route Frequency Provider Last Rate Last Admin ? albuterol neb sol 2.5 mg/3 mL (0.083%) (PROVENTIL,VENTOLIN)  2.5 mg Nebulization Q6H WA [place], [place], [place]   2.5 mg at 11/06/20 1610 ? ascorbic acid (vitamin C) (VITAMIN C) tablet 250 mg 250 mg Per G Tube Daily Jennye Boroughs, PA   250 mg at 11/06/20 0813 ? chlorhexidine gluconate (PERIDEX) 0.12 % solution 15 mL  15 mL Mouth/Throat Q12H Leonia Reeves, PA   15 mL at 11/06/20 0813 ? clonazePAM (KlonoPIN) tablet 0.5 mg  0.5 mg Per J Tube BID Mat Carne, APRN   0.5 mg at 11/06/20 9604 ? doxycycline TAB/CAP 100 mg  100 mg Per G Tube Q12H Mat Carne, APRN   100 mg at 11/06/20 0813 ? famotidine (PEPCID) tablet 20 mg  20 mg Per J Tube Daily Cusick, Lauren, PA   20 mg at 11/06/20 0813 ? folic acid (FOLVITE) tablet 1 mg  1 mg Per G Tube Daily [place], [place], [place]   1 mg at 11/06/20 0813 ? [Held by provider] heparin (porcine) injection 7,500 Units  7,500 Units Subcutaneous Q8H [place], [place], [place]   7,500 Units at 11/02/20 2151 ? insulin glargine (Semglee,Lantus) injection 15 Units  15 Units Subcutaneous Q12H Sharyne Richters, PA   15 Units at 11/06/20 0813 ? [Held by provider] insulin glargine (Semglee,Lantus) injection 30 Units  30 Units Subcutaneous Daily Ciccia, Oneida Arenas, PA     ? [Held by provider] insulin regular human (HumuLIN R, NovoLIN R) 100 unit/mL injection 9 Units  9 Units Subcutaneous Q6H [place], Kandis Nab, APRN     ? miconazole nitrate (SECURA EXTRA THICK ANTIFUNGAL) 2 % cream   Topical (Top) BID Lisette Abu, [place]   Given at 11/06/20 5409 ? midazolam (PF) (VERSED) 1 mg/mL injection          ? Prosource no carb 15 gram protein and 60 kcal  1 packet Per G Tube TID Bettey Costa, PA   1 packet at 11/06/20 8119 ? sodium bicarbonate tablet 650 mg  650 mg Per G Tube TID Sharyne Richters, PA   650 mg at 11/06/20 0813 ? thiamine (VITAMIN B1) tablet 50 mg  50 mg Per G Tube  Daily Jennye Boroughs, [place]   50 mg at 11/06/20 0813 Continuous Infusions:? dexmedetomidine 0.7 mcg/kg/hr (11/06/20 0820) ? insulin regular human 100 units in sodium chloride 0.9 % 100 mL 4 Units/hr (11/06/20 1011) PRN Meds: acetaminophen, Initiate Insulin Infusion Protocol for Adult Patients (Nurse-Driven) **AND** [COMPLETED] insulin regular human **AND** insulin regular human 100 units in sodium chloride 0.9 % 100 mL **AND** POC Glucose (Fingerstick) **AND** Nursing communication **AND** dextrose injection **AND** dextrose injection, glucagon, HYDROmorphone, LORazepam, oxyCODONE **OR** oxyCODONE, zinc oxideVITALS:Temp (24hrs), Avg:99.5 ?F (37.5 ?C), Min:98.6 ?F (37 ?C), Max:100.04 ?F (37.8 ?C)Vitals:  11/06/20 0800 11/06/20 0834 11/06/20 0900 11/06/20 1000 BP: (!) 115/59  120/64 139/81 Pulse: (!) 95 (!) 104 (!) 105 (!) 103 Resp: (!) 22 (!) 24 (!) 24 (!) 37 Temp: (!) 100.04 ?F (37.8 ?C) (!) 100.04 ?F (37.8 ?C) 99.86 ?F (37.7 ?C) 99.86 ?F (37.7 ?C) TempSrc: Bladder  Bladder Bladder SpO2: 99%  98% 100% Weight:     Height:     Intake/Output Summary (Last 24 hours) at 11/06/2020 1036Last data filed at 11/06/2020 1000Gross per 24 hour Intake 2917.31 ml Output 3465 ml Net -547.69 ml Exam unchanged as of 11/06/2020 with exceptions noted belowPhysical ExamLabs Last 24 hours:Most Recent Result Component Value Date/Time  Sodium 137 11/06/2020 05:50 AM  Potassium 5.8 (H) 11/06/2020 05:50 AM  Chloride 108 (H) 11/06/2020 05:50 AM  CO2 19 (L) 11/06/2020 05:50 AM  BUN 86 (H) 11/06/2020 05:50 AM  Creatinine 2.08 (H) 11/06/2020 05:50 AM  WBC 11.1 (H) 11/06/2020 05:50 AM  Hemoglobin 7.9 (L) 11/06/2020 05:50 AM  Hematocrit 25.20 (L) 11/06/2020 05:50 AM  Platelets 68 (L) 11/06/2020 05:50 AM Imaging Last 24 hours:No results found.Overnight Events:--?PS trial overnightInfectious Disease/Sepsis:?MRSA pneumonia-- Continue to monitor WBC count and fever curve-- Continue?doxycycline (end date 11/07/20)-- Prior antibiotics:??Vancomycin (2/5 - 2/14, 2/16 - 2/22);?ceftazidime?(2/17 - 2/19);?ciprofloxacin?(2/19 - 2/23)HEENT:Squamous cell laryngeal cancer invading tracheostomy, status post tracheostomy exchange?10/10/20?and biopsy on 10/12/20-- Continue?Bivona?tracheostomy to bypass obstruction, maintain additional tracheostomy at the bedside-- Awaiting custom trach as per ENT recommendations-- Completed course of palliative radiation as per oncology recommendations?(2/16 - 2/23)-- Continue?to follow-up Oncology recommendations, we appreciate the consult-- Continue?to follow-up?with a primary team (ENT) recommendations; no surgical management at this time as per their recommendationsCardiac:?Septic shock, improved-- Continue to monitor hemodynamic status--?Weaned of?norepinephrine?3/1 at 2PM-- Continue?goal MAP > 65 mmHgHistory of hypertension, hyperlipidemia-- Continue?to hold home lisinopril, Lasix, Crestor until hemodynamically stable and tolerating PORespiratory:?Respiratory failure with circumferential squamous cell laryngeal cancer invading stoma, with intermittent mucus plugging/tracheal occlusion-- Wean off?mechanical ventilation?as tolerated-- Encourage IS and pulmonary toilet when able-- Continue SBT-- Continue?albuterol nebs q6 hours while awake-- Continue?chest physiotherapy q6 hrs?hours while awakeRenal, Fluids/Electrolytes:?AKI on CKD (baseline creatinine 1.5 to 1.6), with recent supratherapeutic vancomycin level, now?stable-- Maintain Foley catheter for the strict monitoring of I/O in this critically ill patient;?placed for urinary retention on?10/22/20, will perform voiding trial when more stableHyperkalemia-- Continue to treat and monitor potassium levels-- Replete bicarbonate as neededHematology:?????Anemia, stable-- Continue to monitor H/HHistory of chronic ITP, [place] storms macroglobulinemia; thrombocytopenia-- Consult Hematology today given the downtrending platelets-- Possible steroids vs IVIG with continued thrombocytopenia as per Hematology recommendations-- Continue?to follow-up Hematology recommendations; as per their evaluation and recent response to respiratory steroids, thrombocytopenia possibly secondary to ITP, hold HITT panel held as per Hematology recommendationsRight IJ thrombus-- Continue?to hold off on systemic anticoagulation given thrombocytopenia, waiting Hematology recommendations--?Restart SQH today, hold systemic anticoagulationEndocrine:??Diabetes, hyperglycemia-- Insulin protocol for hyperglycemia-- Continue to monitor FSs-- Continue to hold home metformin, Lantus,?Apidra-- Continue?to follow-up endocrinology/diabetes Team recommendations, we appreciate the consultGI/Nutrition:???Diarrhea,  improved-- Diet as per Primary Team?via PEG-- Continue bowel regimenNeurologic:??Anxiety, depression; delirium without focal deficit-- Continue?clonazepam 0.5mg  via PEG BID-- Continue?lorazepam?0.5mg  IV q6hrs PRN?severe anxiety-- Continue?to follow-up?Psychiatry recommendations, we appreciate the consult-- Continue?to follow-up?Palliative?Care recommendations, we appreciate the consultPain-- Continue to monitor neurologic status-- Continue acetaminophen 650mg  via PEG q4 hours PRN pain, fever-- Continue?oxycodone 10mg  and 32m via PEG q4 hrs PRN moderate to severe pain, respectively-- Continue?hydromorphone 1mg  IV q3hrs PRN?breakthrough painSedation while intubated-- Continue dexmedetomidine; wean off as toleratedPain, Sedation Management:Pain is being adequately controlled with the current regimenSedation is being adequately addressed by this patient's current regimen Musculoskeletal:??-- PT/OT when ableSkin/wound: Frequent repositioning to mitigate against pressure ulcerationGoals of Care/Advance Directives:Code status:???DNRLines:--?PIVs-- Right port (08/12/20)-- PEG (09/14/20)-- Trach--?Foley catheter?(10/22/20)Prophylaxis: ?-- VAP: HOB elevation;?chlorhexidine-- GI: HOB elevation; famotidine-- VTE: SCD; SQH?Plan discussed with:??Primary Service, Consult Services, Critical Care Nursing and Resident, Respiratory Therapy, Pharmacy, and the Patient. ?The patient's family was not present for rounds this AM.This patient is critically ill as indicated by the following Acute Respiratory FailureThe patient has required 43 minutes of my undivided attention during the past 24 hours for one of the previous mentioned life threatening diagnoses. The critical care interventions have included: Management of Acute Respiratory Failure with initiation of spontaneous breathing trials Lonia Mad, MD SICU Attending3/12/2020

## 2020-11-06 NOTE — Plan of Care
Plan of Care Overview/ Patient Status    Remains sedated on precedex, opens eyes to voice, following commands intermittently and able to nod head to yes/no questions, moving all extremities BUE 3/5 and BLE 2/5, remains in bilateral wrist restraints to prevent patient from pulling at tubes/lines - see restraint flowsheets, pain managed with prn oxycodone & dilaudid with good effect, tmax 100, remains in sinus rhythm with rates 90s-100s, MAP goal >65, 12am K 6.0 - 10units IVP insulin/248mL D5W & lokelma given, repeat K due at 6am, trached - Bivona #8, site care done - peristomal area macerated/xeroform replaced, tolerated 6-hour trial of CPAP 10/5 until 12am without issue, resting on pressure A/C settings after 12am, increased suction requirements q1-q2, sputum thin/tan, abdomen distended, Full strength Nepro infusing through PEG at 12mL/hr (goal) with no residual, rectal tube in place with 0 output thus far, insulin gtt titrated per protocol -currently infusing at 3units/her, foley draining adequate amounts yellow urine with sediment 75-269mL/hr, excoriation to buttocks open to air/secura cream applied, turned/repositioned q2hrs, contact precautions maintained, see flowsheets for additional informationCarrie Frayda Egley, RN 5:41 AM

## 2020-11-06 NOTE — Progress Notes
Inpatient Diabetes Management Team Follow-up Note    Over the past 24 hours, the patient's glucose control has been controlled on the insulin infusion    ? Current Nutritional Status:  Continuous tube feeding with Nepro @ 65 mL/hr (251 grams of carb)     ? Current Anti-hyperglycemic Regimen:  glargine 15 units q 12 hours, Insulin infusion per protocol     ?  Home Anti-hyperglycemic Regimen: Toujeo U300 64 units daily, Apidra 20 units TID with meals, metformin 1000 mg bid     ? Other Contributing Medications:   ? Precedex infusion    PE:    Patient laying in bed sedated on the ventilator  Temp:  [98.6 ?F (37 ?C)-100.04 ?F (37.8 ?C)] 100.04 ?F (37.8 ?C)  Pulse:  [77-108] 104  Resp:  [15-34] 24  BP: (92-136)/(50-84) 115/59  SpO2:  [97 %-100 %] 99 %  Device (Oxygen Therapy): mechanical ventilator     Wt Readings from Last 3 Encounters:   11/05/20 (!) 148.2 kg   10/06/20 (!) 139.5 kg   09/29/20 (!) 142.4 kg       Data Review:  BGs:      Creatinine (mg/dL)   Date Value   54/05/8118 2.08 (H)     Hemoglobin A1c (%)   Date Value   10/10/2020 9.0 (H)       Assessment:  55 yo h/o type 2 diabetes, HTN, Waldenstroms macroglobulinemia, ETOH, laryngeal SCC s/p trach/PEG presented with SOB significant for tumor invading the trach stoma with partial obstruction and he was started on palliative chemo radiation. Course complicated by multiple episodes of Bivona trach occulusion/obstruction. Patient currently admitted to the SICU for ventilatory support and on IV antibiotics for treatment of pneumonia with septic shock. Diabetes team consulted for assistance in glycemic management.      Patient remains on the insulin infusion and also receiving supplemental glargine. He also remains on continuous tube feeding at goal rate. Primary team would like patient to remain on the insulin infusion and he will likely be on it for several days. Diabetes team will sign off at this time. We are available for re-consult when patient is ready for transition off the gtt or he is transferred to the floor. Discussed plan with the covering provider A. Toney Reil, APRN.  Given patient on continuous tube feedings, target blood glucose between 140mg /dl-180mg /dL mg/dL.    Recommendations:   1. Continue the insulin infusion per protocol  2. Continue glargine 15 units BID  3. Blood glucose monitoring per protocol  4. Diet: tube feeds per nutrition and primary team  5. Diabetes team will sign off at this time. Please consider re-consult when patient is ready for transition off the insulin infusion or is transferred to the floor.       Please let us know if you have any questions or concerns about our recommenations. We will continue to follow the patient along with you during his hospitalzation as we attempt to optimize his glycemic control.  Detailed recommendations were communicated to the primary team both verbally and/or through this written note.  I spent a total of 20 minutes with the patient of which 15 minutes (over 50%) was spent in counseling the patient regarding diabetes management and coordinating care with the primary team.     Signed:  Everlene Farrier, APRN  Inpatient Diabetes Management Team   [facility]: [phone]  Diabetes consult pager # [phone]      NOTE: During nights, weekends, and holidays, please contact the  on-call Endocrine Fellow at pager [phone].

## 2020-11-06 NOTE — Progress Notes
Otolaryngology Progress NoteDate: 11/06/2020 Interim History- NAEO, tolerated CPAP trial, on pressure A/C for rest- pending custom trachVitalsTemp:  [98.6 ?F (37 ?C)-100.04 ?F (37.8 ?C)] 99.86 ?F (37.7 ?C)Pulse:  [77-108] 86Resp:  [15-34] 24BP: (92-136)/(50-84) 125/68SpO2:  [97 %-100 %] 97 %Device (Oxygen Therapy): mechanical ventilator I/O last 3 completed shifts:In: 3527.4 [I.V.:1492.4; NG/GT:1535; IV Piggyback:500]Out: 3300 [Urine:3150; Stool:150]Physical ExamGen: alert, vented PCEyes: open, normal external appearanceFace: grossly symmetricNose: clear anteriorlyOP/OC: hemostaticNeck: Bivona 8 cuffed secured w/soft trach ties, necrotic but hemostatic mass surrounding the stoma, remaining neck flat, firm tumor Pulm: no increased WOB on TMDiagnostic DataReviewed, per chartAssessment & Plan54 M with PMH of HTN, HLD, DM, tobacco and EtOH use, Waldenstrom macroglobulinemia, 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 Admitted to SICU for ventilatory support. No acute ENT intervention planned; no curative or meaningful surgical options at this time. Course complicated by multiple episodes of Bivona trach occlusion/obstruction.  - needs custom Bivona trach- no ENT intervention planned at this time; case discussed at tumor board 2/14 and is not a surgical candidate - disease management/goals-of-care best pursued via non-surgical means; eg, Medical Oncology, Radiation Oncology, Palliative Care- transfer to Medical Oncology floor when eligible - continue routine trach care with careful suctioning- ensure trach well secured with tiesElectronically Signed UJ:[id number] Dejae Bernet DDSContact: MHB3/4/20227:06 AM

## 2020-11-06 NOTE — Plan of Care
Plan of Care Overview/ Patient Status    Pt with flat affect. Follows, MAEs, PERRLA-3. Bilateral wrist restraints maintained for pt safety. Mildly anxious with turns at times--reassurance,support, and given PRN 0.5mg  Ativan with good result. Precedex continues at 0.26mcg/kg/hr. Klonopin as scheduled. Tmax 100.2 via bladder probe. NSR to ST with HR 80s-100s. BP stable with MAPs>65; BP 100s-130s/50s-80s. Repeat BMP sent this afternoon. O2 sats stable on unchanged pressure ac vent settings. First time pt placed on CPAP 10/5 in am he did not tolerate d/t desating and tachypnea. 2nd attempt at CPAP 10/5 pt only tolerated for a couple of hours ( 2 1/2 hours per RT), not the whole 6 hours. Per RT pt became tachypneic so he was placed back on pressure ac. Pt placed back on CPAP 10/5 this afternoon and so far pt tolerating. Lungs diminished. Initially suctioned for moderate amt white frothy secretions but this afternoon suctioned for small amt yellow thin secretions. Trach care completed. Bivona trach dislodged from 14 to 9 in am with turn. Fellow reinserted trach back to 14 and CXR completed. Pt did not desat with trach dislodgement. NPO. TF of Nepro full strength continues via peg tube at 65cc/hr. Minimal residuals obtained. Abdomen obese soft, non tender. Normoactive bowel sounds. Rectal tube in place with no output . Insulin gtt therapy continues--please see flow sheet for titration rates. Given scheduled Lantus. Foley draining 65 to 200cc yellow urine with sediment an hour. Secura thick creme to excoriated are on buttocks, coccyx area. Neck tumor cleaned with gauze and saline and xeroform dressing changed. No family contact this shift; fall and contact precautions maintained.

## 2020-11-06 NOTE — Plan of Care
Spontaneous Breathing Trial performed for 6 hours on the settings :CPAP: 5 cmH2OPressure Support: 10 cmH2OFiO2: 40%Patient tolerated trial well. At end of 2nd 6 hour trial parameters as noted below.Parameters:  RR: 23Vt: 440VE: 10.61F/Vt: 52Vitals:HR-Pulse: (!) 95 SpO2-SpO2: 99 %Trial discontinued due to time limit reached. Placed back on resting PC/AC settings per MD order for rest overnight.

## 2020-11-06 NOTE — Progress Notes
[name]	SICU Progress NoteAttending Provider: Alric Quan, MDPost-Operative Day/Post Injury Day Procedure(s):2/5: [name] exchange (ENT) Hospital LOS: 28 days Interim History:  HPI: 55 y.o. male admitted to the SICU on 2/9 for ventilator management following an RRT on the oncology floor for desaturation and increased work of breathing while on trach mask and ultimately placed back on vent. ?Patient was initially a transfer from [facility] on 2/4 after trach and PEG when he had a partial obstruction of trach site from a [name] necrotic mass. He underwent trach exchange 2/5. Was on trach mask on oncology floor however had 2 episodes on floor requiring RRT for lavage and likely mucous plugging. ?Course Complicated by: - Incidental R IJ thrombus - Multiple episodes of presumed mucous plugging requiring RRT's ?PMH: Chronic ITP, Waldenstrom macroglobulinemia, HTN. HLD, DM, tobacco and alcohol use, Laryngeal SCC (weekly Cisplatin and RT)?PSH: None known ?Home meds: Lipitor, Pepcid, Lisinopril, Metformin, Oxycodone, miralax, Insulin U100 and Lantus SICU course:2/9: Arrives to SICU HDS, on vent PCV. Appears well. Transitioned to Pressure support on vent and tolerating well. O/N: Episode of patient feeling like not getting a full breath, extremely tachypenic and increased work of breathing and tachycardia, attempted suctioning and lavage without improvement, attempted to switch to PCV and VAC but due to dyssynchrony unable to tolerate, no desat but poor ventilation and TV and ETCO2 up to 100. ENT at bedside do not feel additional scope would benefit as he was just scoped a few hours ago, ultimately gave fentanyl push and started propofol for vent synchrony but despite max prop still dyssynchronous. Gave one dose of roccuronium once sedated for vent synchrony with good effect. Albuterol and HTS neb given and peaks improved from 50 to 30's. No further paralytic needed and now synchronous on VAC. Kept on propofol for vent synchrony. CXR reassuring and back to GCS 11T. Hypotension only following sedation and Rocc, Low dose Neo started. 2/10: Kept on higher sedation in AM for [place] A/P (for staging) and soft tissue neck. Weaning sedation and will plan for PSV trial once more awake. Scoped by ENT in AM, airway patent. Q1-2hr suction requirements of thick tan secretions. No further episodes of respiratory distress although sedation remains high. Tube feeds changed to continuous given significant hyperglycemia. Insulin regimen changed per diabetes team recommendations. Pressure ulceration noted around tracheostomy --> ENT at bedside to evaluation, likely some component of invasive tumor growth. Palliative care consult for assist with symptom management and family moving forward. Primary oncologist office made aware of referral. O/N: weaning down on Prop, no episodes of respiratory distress or desats overnight. Febrile to 101.3, sputum and blood cultures ordered. 500cc LR bolus given for some oliguria and rising Cr. 2/11: Given 500cc NS for oliguria with good response. Vanco level yesterday 25, likely cause of worsening aki, on hold. UOP & lytes remains stable. Insulin adjusted per diabetes team. PSV as tolerated (doing well). CPT added. Confirmed with ENT pulm recs for blue portex suctionaid trach- no plans to switch at this time. Bowel regimen added. Plan keep on neo if low dose but if inc, consider changing to levo as likely then not in setting of sedation. Family updated on phone. Confirmed w heme- no AC rec for R IJ thrombus until plt > 50. Was going to attempt to place pt on TM overnight as he did very well on PSV x 3hrs, however, pt had anxiety attack regarding his cancer and prognosis, he requested to sleep during night and was agreeable to TM in  am. O/N: Patient agrees to PSV overnight which he did very well on. Agreeable to getting OOB to chair and possibly trach mask in AM but wants to stay on vent tonight. Off Neo by AM.  2/12: trial trach mask as tolerated this AM. Adjusted insulin regimen per the diabetes management service. Sputum now with staph, awaiting sensitivities. More loose stools today; nursing placed rectal tube and held bowel regimen.  ON: Anxious at times, feeling like he cannot breath but doing well on TM. Frequent suctioning. Increased standing. FIO2 increased to 40%.  insulin to 14U per endo reccs. 2/13: continue to adjust insulin regimen, now 20u regular q6. Cr continues to uptrend 2.57 --> 2.63 and repeat vanco level still elevated 24.6; pharmacy to continue dosing adjustments. Otherwise, remains on trach mask. Sputum with MRSA --> d/c ceftriaxone. ON: increased standing insulin to 24U. 2/14: Will discuss [facility] plan with hematology now that PLT > 50 x2 checks. Will also d/c vancomycin today (10d of therapy thus far). AKI appears to have peaked at 2.57 -> 2.63 -> 2.47. Awaiting transfer to the oncology service on NP 15. Okay to start [facility], but will hold off today given more bloody secretions. 2/15: Plan to start palliative RT today. AM session aborted due to extreme anxiety (despite premedication with IV ativan), tried again in the PM with 10mg  IV Valium + 3mg  IV ativan, procedure again aborted due to extreme anxiety and inability to tolerate. Pending discussion with rad onc in AM re: utility of ongoing attempts at RT. Pending transfer to NP15 on med onc service.  O/N: increasingly anxious / restless, but able to re-direct and calm down with PRN ativan. Liquid stools, placed rectal tube. Will start banatrol and check CBC in AM, check c dif. This had previously improved by holding bowel regimen.2/16: Acutely increased WOB, HR 130s, SBP 170s --> ENT bedside scope with trach patent, though notable secretions --> suctioned with minimal improvement; placed back on ventilator PSV 10 / +5 with immediate improvement in VS and respiratory distress. Precedex gtt started to assist more frequent suctioning. CXR with LLL consolidation --> resend sputum, and restart Vanco (close trough monitoring with AKI) given known MRSA PNA. Increased diarrhea, cdiff negative, transition to Vital TF. LR 500cc for hemoconcentration of labs, tachycardia. Appreciate radiation oncology; sedated patient with Propofol gtt, bolus x1 with good effect --> able to tolerate 15 minute radiation session; plan to repeat Thursday, Friday, Monday, and Tuesday. 2/17: Psych consulted for depression--> Doxepin added, Klonopin added. Hypernatremia--> D5W and free water flushes added. Ceftaz added for GNR in sputum. Plan for family discussion this PM (see separate note) and radiation this PM. Foley replaced for retention. ON: Tolerated PSV 15/5. Repeat Na 148.2/18: Plan for radiation this afternoon. Rectal tube removed.. Hgb 6.3--> transfused 1un PRBCs. Discussed care with sister and niece at bedside, remain full code and plan to continue radiation course through Tuesday (see family communication note).2/19: PSV weaned 15 --> 12/+5. Start Clonidine and discontinue Precedex gtt. Discontinue Gabapentin and Dilaudid IV, decrease Oxycodone to 2.5 / 5. Na improved 144, discontinue D5W; sugars should improve, maintain current regimen per DM team. GNR per micro lab not pseudomonas, klebsiella, or other enteric organism; would have speciated --> change Ceftaz to PO Cipro, to complete course 2/23 for one week. Cr again increased with Vanco level over 20 --> next dose held, monitor level closely. Hgb remains low despite 1U yesterday, 6.3 --> 7.0; start multivitamins and check with Heme. ON: Low grade fevers stable. Given ativan  0.5mg  in place of klonipin with good anxiolytic effect. Transfuse 1u PRBC (likely half unit) for Hgb 6.7.2/20: Acute respiratory distress this morning after turn, minimal secretions, did not improve with increased pressure or VAC --> bagged x5 mins with improvement, placed on PCV, IP 20 --> CXR pending, start Propofol, add back Ativan and Morphine PRN. Appreciate heme assistance with persistent anemia. Cr continues to rise following elevated vanco trough; hold any further doses, level will likely remain therapeutic to final date of course (2/22) --> unfortunately, patient did receive additional dose of Vanco 2g per pharmacy order this morning; discontinued placeholder, consider Doxycycline if Vanco level subtherapeutic prior to end date.ON: Remains on PCV. Remained on low dose levo 0.02 throughout night. Manual suctioning by RT overnight with thin tan secretions. AM labs notable for Hgb 9.1(6.7) after 1uPRBC transfusion yesterday. Worsening AKI Cr 2.51 (2.44).2/21: Patient alert and breathing comfortably this morning --> wean PEEP back to 5, consider retrial of PSV today pending radiation plans. Free water flushes discontinued, hypernatremia resolved. Worsening AKI from Vanco; since discontinued, monitor UOP. LR 250cc, wean off Levo gtt. Appreciate palliative care assistance after event yesterday, patient requesting further discussion. Lantus changed to 15U QHS, 10U QAM and bolus increased to 28U Q6.ON: Acute respiratory distress at change of shift, lost tidal volumes and desaturated to high 80s requiring bagging, recovered. Pt expressing that he cannot breathe, feels obstructed --> suctioned with 10Fr suction w/ resistance met at 20cm and minimal secretions. ENT at bedside for scope --> thick secretions noted but airway patent.. Noted that Bivona trach was retracted, advanced to proper position by ENT. Per ENT, tracheal tissue proximal to bivona tip very flimsy. Likely tissue collapsing against distal tip with trach retracted in poor position causing obstruction. Patient acutely distressed and agitated during event, propofol gtt restarted will plan to wean off tonight. Start decadron 8mg  q12 per onc recs for peritumoral inflammation. Hyperkalemic in AM to 5.7 --> EKG unchanged, insulin/dextrose/Ca2+ x 1. Transfuse 1u PRBC for hgb 6.9. 2/22: Keep on PCV today and continue propofol gtt. Start Insulin gtt and increase Lantus to 20u BID. No bowel movements --> discontinue banatrol. Plan for radiation today. Discussed transfer to MICU no beds available, will initiate transfer once a bed becomes available. Plan for multidisciplinary family meeting this week. ON: Rested on PCV, low dose prop. WBC count 12 (7) this am2/23: Plan for last palliative radiation session later today. RR decreased to 12. Given rising white count and increased in line suctioning requirement Sputum culture sent and CXR obtained. Will stop Cipro and start ceftriaxone. 1L LR bolus given for AKI, rising BUN as well as likely concentration on CBC. Plan to start weaning off prop after radiation and attempt PSV trials again. Family discussion with all teams Thursday or Friday. CPT added back given increased secretions attempted to add back HTS nebs however hospital is completely out per pharmacy discussion. Respiratory culture again growing GPC and GNR. ON: BG stable on ins gtt. NAE2/24: This AM, less responsive (GCS 10T) and BP down to 60/40s, but also worsening vent synchrony --> increased oxycodone and dilaudid followed by 1000 LR bolus with good effect and pan-cx given worsening leukocytosis. Started vanc / zosyn.  Later, self d/c bivona trach and desat to 60s; SICU attending at bedside and replaced bivona trach without difficulty and placed back on the ventilator.  ENT at bedside to scope and confirmed trach positioning. Will plan for family meeting tomorrow. ON: NAE2/25: GCS 11T, but still withdrawn / lethargic. Will  d/c PRN ativan to minimize sedation and continue to wean propofol as tolerated, though patient has been mentating on higher doses than this. Still low dose levo requiremements. Bivona trach dislodged ~4cm today, obturator placed and re-advanced without issue. Trial PSV 15/5, tolerating with Tv 400s. D/w ENT, will try to work on custom trach for him. Plan for family meeting this afternoon for goals of care discussions. Rested on [facility] settings after ~6 hours of 15/5 (work of breathing + tachypnea 40s). Decreased lantus to 15 BID per endocrine. In afternoon, became more agitated / tachy requiring additional PRN 1mg  ativan + 1mg  versed + dilaudid. See ACP note for family meeting discussion. O/N: NAE. 2/26: Attempt to wean propofol to 35, increased agitation and tachypnea --> increase Prop back to 40 and oxycodone given with improvement. Plan for PSV 15/5 x 4 hours today as tolerated. Increased Lantus to 20u BID. Discontinued Zosyn. Discussed with pharmacy switching Vanco to Clinda given worsening renal function --> recommend continuing Vanco as high risk of C.diff with Clinda, will recheck random vanco level tomorrow @ 0500. 1L NS given for rise in creatinine. O/N: Vanc trough 21.4. 2/27: Plan for PSV 15/5 x 6 hours as tolerated. 1L LR given for high insensible losses. Change Vanco to PO Doxy. Increase Lantus to 30u BID, d/c insulin gtt and start HDISS.  O/N: K 5.7 --> insulin/dextrose/Ca given. 2/28: Remains on Levo. K 5.7--> 5.9--> Insulin/ Dextrose/ Ca x given x2 and Lokelma x1. Hyperglycemic--> Lantus 30 daily, HDISS, Regular Insulin 9un Q6hours. Decreased IP to 18, tolerating with TV 400s-500s. Heme consulted for ongoing thrombocytopenia, low concern for HIT, no plan to treat ITP unless plt<30k or bleeding.  O/N: At shift change insulin Gtt restarted, Significant stool output after lokelma, K improving to 5.4, will trend. AM labs with Plt 35, HSQ held following heme reccs. 3/1: [name] noted to be dislodged, re-advanced and ENT consulted with no acute interventions. CXR showing trach in stable position. Decreased IP to 15. Will trial on PSV 15/5 x6hours BID. Afternoon plt stable at 34; will continue to hold HSQ. K stably elevated at 5.5; no acute intervention given insulin gtt and large rectal tube output. Levo weaned off. O/N: Rested on Pressure control after 6 hours PSV. NAE. Repeat plt stable at 34. 3/2: Weaning prop and transitioning to dex to decrease sedation and aid in anxiety/agitation management. Will trial on PSV 10/5 x6hr BID today, resting on PCV. Psych re-engaged due to withdrawn affect/mood this AM; d/ced doxepin to avoid further delirium/AMS. Acutely tachycardic, tachypneaic, and hypoxic w/ O2 sat in 80's on PSV trial --> prop gtt increased and placed back on PCV with IP 12. Weaned off prop, on dex gtt. ON: Rested on PCV. K 6.1 --> 1g Ca, insulin and dextrose given, EKG obtained.3/3: Potassium 6.0 --> insulin/dextrose, Lokelma 10g x 1 given. Held off on further calcium as serum calcium 10.3 and corrected Calcium is ~11.6. Bicarb 19 --> ordered Na Bicarb tabs 650mg  TID. 500cc NS x 1  Given for goal even to +500. Tube feeds changed to Nepro. Trial of PSV 5/5 --> last ~ then became tachypneic and put back on 10/5. Tolerated 10/5 x 6 hours, rested on PCV. Repeat potassium 5.8 --> 5u insulin/dextrose. Will plan for additional dose of Lokelma at 6pm. Placed on second 110.5 PSV trial ~6pm, plan for 6hrs as tolerated. O/N: Completed PSV trial.  Repeat K - 6 --> insulin/dextrose and lokelma given. 3/4 .Patient repositioned with Trach pulled out halfway, replaced without  difficulty. CXR post trach in good position. 1st PSV 10/5 trial for 2.5 hours, stopped due to tachypnea. Plan for 2nd trial for goal 6 hours. 4pm K 5.6. Review of Allergies/Meds/Hx: I have reviewed the patient's: allergies, past medical history, past surgical history, family history, social history, prior to admission medicationsObjective: Vitals:I have reviewed the patient's current vital signs as documented in the EMRTemp:  [98.6 ?F (37 ?C)-99.86 ?F (37.7 ?C)] 99.86 ?F (37.7 ?C)Pulse:  [74-108] 95Resp:  [14-34] 22BP: (92-133)/(50-84) 123/65SpO2:  [97 %-100 %] 99 %SpO2: 99 %Device (Oxygen Therapy): mechanical ventilatorI/O's:I have reviewed the patient's current I&O's as documented in the EMR.Gross Totals (Last 24 hours) at 11/06/2020 0029Last data filed at 11/06/2020 0000Intake 3437.48 ml Output 3050 ml Net 387.48 ml Physical ExamConstitutional: Middle aged trached man in NADHEENT: NCAT. PERRL, EOMI. External ears normal. Bivona trach midline with xeroform distal skinCardiovascular: RRR, no MRG appreciated. Distal pulses are 2+ bilaterally.Pulmonary: Trached on vent. Tachypneac at times. Suctioned for small amount frothy secretions. Lungs clear after suctioningAbdominal: Soft, obese, non distended, non tender to palpation, +BS. PEG Tube site intactGenitourinary: Foley catheter in place, draining clear yellow urine.Musculoskeletal: BUE strength 4/5, BLE 2/5Neurological: Intermittent opens eyes, nods head, FSC. Agitated at timesSkin: Warm and dry.Psychiatric: Unable to assessLabs: Refer to EMR. I have reviewed the patient's labs within the last 24 hours; see assessment and plan below for significant abnormals.Microbiology:Refer to EMR. I have reviewed all [name] results within the last 24 hours; see assessment and plan below.Diagnostics:Refer to EMR. I have reviewed all [name] results within the last 24 hours; see assessment and plan below.Chest ZD:GLOVF to EMR. I have reviewed all [name] results within the last 24 hours; see assessment and plan below.Abdominal IE:PPIRJ to EMR. I have reviewed all [name] results within the last 24 hours; see assessment and plan below.ECG/Tele Events: I have reviewed the patient's ECG and telemetry as resulted in the EMR.Assessment/Plan Neurologic: Analgesia/sedation - Tylenol 650 Q6 PRN - Oxycodone 10 / 15 Q4 PRN - Dilaudid 1 PRN breakthrough- Precedex gtt Anxiety/depression- s/p clonidine taper (stopped early 2/2 hypotension)- Clonazepam 0.5mg  BID, consider d/c if mental status continues to be depressed- Ativan 0.5mg  IV Q6 PRN acute agitation- Appreciate palliative care and psych consults?ENT: L laryngeal SCC, invading tracheostomy: s/p trach exchange 10/10/20 and biopsy 10/12/20- Bivona trach to bypass obstruction 	- F/u with ENT re: custom Bivona trach- Unfortunately, there are no surgical / curative options- Palliative radiation (2/16, 2/17, 2/18, 2/21, 2/22, 2/23)	- Required PCV with full propofol sedation to tolerate?Cardiac: Septic shock- s/p Norepinephrine gtt(2/24- 3/1) to maintain MAP>65Hx of HTN / HLD- Hold home lisinopril, lasix, crestor given AKI?Respiratory:  Respiratory failure (circumferential tumor invasion of stoma, mucous plugging / trach occlusion)- PC/AC: IP 12, PEEP +5- PSV 10/5 x 6 hours BID, weaning IP as tolerated - s/p Decadron 8mg  BID for tumor inflammation (2/22 -2/24) per med oncology- Albuterol Q6- Could consider mucomyst nebsGI: Diarrhea, resolved - PEG in place with TF: Nepro@ 65 ml/hour- Pepcid while vented?Renal: AKI on CKD (baseline Cr 1.5-1.6), 2/2 supratherapeutic vancomycin level- Cr continues to rise, continue to trend daily- Foley replaced 2/17 for urinary retention- increase Sodium Bicarbonate tabs 1300 TIDHyperkalemia:- medical management with Lokelma and Calcium if needed- Trend lytes- Lokelma (can give up to TID) Hypercalcemia- Corrected Calcium >11- Avoid further Calcium repletion at this time Infectious Disease:  MRSA pneumonia- 2/24 sputum: 4+ MRSA, 2+ MRSA- 2/23 sputum: MRSA- Vanco  (2/24- 2/27), de-escalate to Doxy (2/27 - 3/) total 10d course - S/p Zosyn (2/24- 2/26)Hx MRSA PNA;  4+ GNR PNA- s/p Vanco (2/5 - 2/14, 2/16 - 2/22)- s/p Ceftaz (2/17- 2/19)- s/p Cipro (2/19 - 2/23)?Hematology:Critical illness anemia- Sanguinous secretions, expected given tumor burden and coagulopathy, may worsen with radiation and baseline hematologic disease- Multivitamins and cofactors- Maintain Hgb >7?Hx of chronic ITP, Waldenstrom macroglobulinemia- Transfuse platelets if <30k, bleeding, or need for invasive procedure per heme- no need to treat ITP unless plts<30k- would hold Hep SC if plts<50k, resume today ??- Appreciate hematology recommendations?R IJ thrombus - High risk heparin gtt when plt consistently >50 and no more bloody secretions from tracheostomy- Maintain SCDs at all times?Endocrine: Hx T2DM- Insulin Gtt restarted 2/28- Lantus 15 units BID- Hold Regular insulin 9un SC Q6hours- Hold HDISS- HOLD home Metformin, Lantus 64u, Apidra 20u TID w meals- Appreciate diabetes team recs, signed off for now. Reconsult when appropriateMusculoskeletal: Generalized deconditioning- OOB daily, PT/OT evals- Turn and reposition Q2 hours, PUP dressing?Device Review:- R IJ Port (08/12/20) per guidelines, nonocclusive DVT does not require removal of line- PEG (09/14/20)- Bivona trach (10/10/20)?Code Status / Dispo:- Transition to DNR (see ACP note 2/25)- Per ENT, patient to transfer to medical oncology after SICU- Appreciate palliative care assistance with GOC discussionsNotifications : For questions please call SICU Covering Provider in Epic EMR (primary) SICU M[facility] Dynamic Roles (secondary)Y[facility] 7-1 APP			[phone] 6-1 Frontside		[phone] 6-1 Backside		[phone] Verdi-2 APP			[phone]:Lukus Binion, APRNSICU APP3/12/2020

## 2020-11-07 LAB — BASIC METABOLIC PANEL
BKR ANION GAP: 8 (ref 7–17)
BKR ANION GAP: 10 (ref 7–17)
BKR BLOOD UREA NITROGEN: 92 mg/dL — ABNORMAL HIGH (ref 6–20)
BKR BLOOD UREA NITROGEN: 90 mg/dL — ABNORMAL HIGH (ref 6–20)
BKR BUN / CREAT RATIO: 45.1 — ABNORMAL HIGH (ref 8.0–23.0)
BKR BUN / CREAT RATIO: 44.8 — ABNORMAL HIGH (ref 8.0–23.0)
BKR CALCIUM: 9.6 mg/dL (ref 8.8–10.2)
BKR CALCIUM: 9.9 mg/dL (ref 8.8–10.2)
BKR CHLORIDE: 112 mmol/L — ABNORMAL HIGH (ref 98–107)
BKR CHLORIDE: 111 mmol/L — ABNORMAL HIGH (ref 98–107)
BKR CO2: 21 mmol/L (ref 20–30)
BKR CO2: 20 mmol/L (ref 20–30)
BKR CREATININE: 2.04 mg/dL — ABNORMAL HIGH (ref 0.40–1.30)
BKR CREATININE: 2.01 mg/dL — ABNORMAL HIGH (ref 0.40–1.30)
BKR EGFR (AFR AMER): 41 mL/min/{1.73_m2} (ref >60)
BKR EGFR (AFR AMER): 42 mL/min/{1.73_m2} (ref >60)
BKR EGFR (NON AFRICAN AMERICAN): 34 mL/min/{1.73_m2} (ref >60)
BKR EGFR (NON AFRICAN AMERICAN): 35 mL/min/{1.73_m2} (ref >60)
BKR GLUCOSE: 171 mg/dL — ABNORMAL HIGH (ref 70–100)
BKR GLUCOSE: 142 mg/dL — ABNORMAL HIGH (ref 70–100)
BKR POTASSIUM: 5.8 mmol/L — ABNORMAL HIGH (ref 3.3–5.3)
BKR POTASSIUM: 5.8 mmol/L — ABNORMAL HIGH (ref 3.3–5.3)
BKR SODIUM: 141 mmol/L (ref 136–144)
BKR SODIUM: 141 mmol/L (ref 136–144)

## 2020-11-07 LAB — CBC WITH AUTO DIFFERENTIAL
BKR WAM ABSOLUTE IMMATURE GRANULOCYTES.: 0.25 x 1000/ÂµL (ref 0.00–0.30)
BKR WAM ABSOLUTE LYMPHOCYTE COUNT.: 0.18 x 1000/ÂµL — ABNORMAL LOW (ref 0.60–3.70)
BKR WAM ABSOLUTE NRBC (2 DEC): 0 x 1000/ÂµL (ref 0.00–1.00)
BKR WAM ANALYZER ANC: 8.16 x 1000/ÂµL — ABNORMAL HIGH (ref 2.00–7.60)
BKR WAM BASOPHIL ABSOLUTE COUNT.: 0.03 x 1000/ÂµL (ref 0.00–1.00)
BKR WAM BASOPHILS: 0.3 % (ref 0.0–1.4)
BKR WAM EOSINOPHIL ABSOLUTE COUNT.: 0.26 x 1000/ÂµL (ref 0.00–1.00)
BKR WAM EOSINOPHILS: 2.7 % (ref 0.0–5.0)
BKR WAM HEMATOCRIT (2 DEC): 24.1 % — ABNORMAL LOW (ref 38.50–50.00)
BKR WAM HEMOGLOBIN: 7.2 g/dL — ABNORMAL LOW (ref 13.2–17.1)
BKR WAM IMMATURE GRANULOCYTES: 2.6 % — ABNORMAL HIGH (ref 0.0–1.0)
BKR WAM LYMPHOCYTES: 1.9 % — ABNORMAL LOW (ref 17.0–50.0)
BKR WAM MCH (PG): 27 pg (ref 27.0–33.0)
BKR WAM MCHC: 29.9 g/dL — ABNORMAL LOW (ref 31.0–36.0)
BKR WAM MCV: 90.3 fL (ref 80.0–100.0)
BKR WAM MONOCYTE ABSOLUTE COUNT.: 0.82 x 1000/ÂµL (ref 0.00–1.00)
BKR WAM MONOCYTES: 8.5 % (ref 4.0–12.0)
BKR WAM MPV: 12.2 fL — ABNORMAL HIGH (ref 8.0–12.0)
BKR WAM NEUTROPHILS: 84 % — ABNORMAL HIGH (ref 39.0–72.0)
BKR WAM NUCLEATED RED BLOOD CELLS: 0 % (ref 0.0–1.0)
BKR WAM PLATELETS: 84 x1000/ÂµL — ABNORMAL LOW (ref 150–420)
BKR WAM RDW-CV: 17.6 % — ABNORMAL HIGH (ref 11.0–15.0)
BKR WAM RED BLOOD CELL COUNT.: 2.67 M/ÂµL — ABNORMAL LOW (ref 4.00–6.00)
BKR WAM WHITE BLOOD CELL COUNT: 9.7 x1000/ÂµL (ref 4.0–11.0)

## 2020-11-07 LAB — IMMATURE PLATELET FRACTION (BH GH LMW YH)
BKR WAM IPF, ABSOLUTE: 4.2 x1000/ÂµL (ref <20.0)
BKR WAM IPF: 5 % (ref 1.2–8.6)

## 2020-11-07 LAB — PHOSPHORUS     (BH GH L LMW YH)
BKR PHOSPHORUS: 4.6 mg/dL — ABNORMAL HIGH (ref 2.2–4.5)
BKR PHOSPHORUS: 4.8 mg/dL — ABNORMAL HIGH (ref 2.2–4.5)

## 2020-11-07 LAB — CORTISOL: BKR CORTISOL, PLASMA: 14.4 ug/dL

## 2020-11-07 LAB — MAGNESIUM
BKR MAGNESIUM: 2.3 mg/dL (ref 1.7–2.4)
BKR MAGNESIUM: 2.3 mg/dL (ref 1.7–2.4)

## 2020-11-07 MED ORDER — CALCIUM GLUCONATE INJECTION
Freq: Once | INTRAVENOUS | Status: CP
Start: 2020-11-07 — End: ?
  Administered 2020-11-07: 12:00:00 20.000 mL via INTRAVENOUS

## 2020-11-07 MED ORDER — CARBOXYMETHYLCELLULOSE SODIUM 0.5 % EYE DROPS IN A DROPPERETTE
0.5 % | Freq: Three times a day (TID) | OPHTHALMIC | Status: DC | PRN
Start: 2020-11-07 — End: 2020-11-23

## 2020-11-07 MED ORDER — SODIUM ZIRCONIUM CYCLOSILICATE 10 GRAM ORAL POWDER PACKET
10 gram | Freq: Once | ORAL | Status: CP
Start: 2020-11-07 — End: ?
  Administered 2020-11-07: 21:00:00 10 gram via ORAL

## 2020-11-07 MED ORDER — SODIUM ZIRCONIUM CYCLOSILICATE 10 GRAM ORAL POWDER PACKET
10 gram | Freq: Once | ORAL | Status: CP
Start: 2020-11-07 — End: ?
  Administered 2020-11-07: 12:00:00 10 gram via ORAL

## 2020-11-07 NOTE — Plan of Care
Plan of Care Overview/ Patient Status    Remains sedated on precedex, opens eyes to voice, following commands intermittently and able to nod head to yes/no questions, moving all extremities BUE 3/5 and BLE 2/5, remains in bilateral wrist restraints to prevent patient from pulling at tubes/lines - see restraint flowsheets, pain managed with prn oxycodone & dilaudid with good effect, tmax 100, remains in sinus rhythm with rates 80s-100s, MAP goal >65, trached - Bivona #8, site care done - peristomal area macerated/xeroform replaced, tolerated 6-hour trial of CPAP 10/5 until 10pm without issue, resting on pressure A/C settings after 10pm, suction requirements q1-q2, sputum thin/tan, abdomen distended, Full strength Nepro infusing through PEG at 65mL/hr (goal) with no residual, rectal tube in place with 0 output thus far, insulin gtt titrated per protocol -currently infusing at 4.5units/her, foley draining adequate amounts yellow urine with sediment 75-270mL/hr, excoriation to buttocks open to air/secura cream applied, turned/repositioned q2hrs, contact precautions maintained, see flowsheets for additional information Laurena Bering, RN 5:31 AM

## 2020-11-07 NOTE — Plan of Care
Plan of Care Overview/ Patient Status    Open eyes spontaneously. At times follows simple commands. +4 PERRLA. Able to move all four extremities. Pain and agitation managed with precedex  0.7 mcg/kg/hr, scheduled clonopin, prn oxycodone and dilaudid. HD stable. Temp about 99. HR: 80-110's. SBP mostly 100-110's. Currently on CPAP 10/5 since 08:00 plan to continue for 6hrs, rest on vent than go back on CPAP for another 6hrs. About q1hr ET sxn with mod, thin, tan/yellow secretion. Tolerating tube feed via NGT with minimal residual noted. Blood sugar labile being managed with lantus and insulin gtt. Foley with > 188ml/hr. Minimal BM. Skin per flowsheet. Sister updated over the phone on POC, verbalized understanding. Please see flowsheet for additional information.

## 2020-11-07 NOTE — Progress Notes
Otolaryngology Progress NoteDate: 11/07/2020 Interim History- NAEO, tolerated CPAP trial, on pressure A/C for rest- pending custom trachVitalsTemp:  [37.4 ?C-37.9 ?C] 37.4 ?CPulse:  [78-107] 89Resp:  [8-37] 19BP: (95-139)/(54-81) 107/63SpO2:  [97 %-100 %] 98 %Device (Oxygen Therapy): mechanical ventilator I/O last 3 completed shifts:In: 2712.9 [P.O.:60; I.V.:702.9; NG/GT:1700; IV Piggyback:250]Out: 3355 [Urine:3355]Physical ExamGen: alert, vented PCEyes: open, normal external appearanceFace: grossly symmetricNose: clear anteriorlyOP/OC: hemostaticNeck: Bivona 8 cuffed secured w/soft trach ties, necrotic but hemostatic mass surrounding the stoma, remaining neck flat, firm tumor Pulm: no increased WOB on TMDiagnostic DataReviewed, per chartAssessment & Plan54 M with PMH of HTN, HLD, DM, tobacco and EtOH use, Waldenstrom macroglobulinemia, 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 Admitted to SICU for ventilatory support. No acute ENT intervention planned; no curative or meaningful surgical options at this time. Course complicated by multiple episodes of Bivona trach occlusion/obstruction.  - needs custom Bivona trach- no ENT intervention planned at this time; case discussed at tumor board 2/14 and is not a surgical candidate - disease management/goals-of-care best pursued via non-surgical means; eg, Medical Oncology, Radiation Oncology, Palliative Care- transfer to Medical Oncology floor when eligible - continue routine trach care with careful suctioning- ensure trach well secured with Karn Cassis, MD (PGY-1)

## 2020-11-07 NOTE — Progress Notes
[facility] [facility]	SICU Progress NoteAttending Provider: Alric Quan, MDPost-Operative Day/Post Injury Day Procedure(s):2/5: [name] exchange (ENT) Hospital LOS: 29 days Interim History:  HPI: 55 y.o. male admitted to the SICU on 2/9 for ventilator management following an RRT on the oncology floor for desaturation and increased work of breathing while on trach mask and ultimately placed back on vent. ?Patient was initially a transfer from [name] on 2/4 after trach and PEG when he had a partial obstruction of trach site from a new necrotic mass. He underwent trach exchange 2/5. Was on trach mask on oncology floor however had 2 episodes on floor requiring RRT for lavage and likely mucous plugging. ?Course Complicated by: - Incidental R IJ thrombus - Multiple episodes of presumed mucous plugging requiring RRT's ?PMH: Chronic ITP, Waldenstrom macroglobulinemia, HTN. HLD, DM, tobacco and alcohol use, Laryngeal SCC (weekly Cisplatin and RT)?PSH: None known ?Home meds: Lipitor, Pepcid, Lisinopril, Metformin, Oxycodone, miralax, Insulin U100 and Lantus SICU course:2/9: Arrives to SICU HDS, on vent PCV. Appears well. Transitioned to Pressure support on vent and tolerating well. O/N: Episode of patient feeling like not getting a full breath, extremely tachypenic and increased work of breathing and tachycardia, attempted suctioning and lavage without improvement, attempted to switch to PCV and VAC but due to dyssynchrony unable to tolerate, no desat but poor ventilation and TV and ETCO2 up to 100. ENT at bedside do not feel additional scope would benefit as he was just scoped a few hours ago, ultimately gave fentanyl push and started propofol for vent synchrony but despite max prop still dyssynchronous. Gave one dose of roccuronium once sedated for vent synchrony with good effect. Albuterol and HTS neb given and peaks improved from 50 to 30's. No further paralytic needed and now synchronous on VAC. Kept on propofol for vent synchrony. CXR reassuring and back to GCS 11T. Hypotension only following sedation and Rocc, Low dose Neo started. 2/10: Kept on higher sedation in AM for [place] A/P (for staging) and soft tissue neck. Weaning sedation and will plan for PSV trial once more awake. Scoped by ENT in AM, airway patent. Q1-2hr suction requirements of thick tan secretions. No further episodes of respiratory distress although sedation remains high. Tube feeds changed to continuous given significant hyperglycemia. Insulin regimen changed per diabetes team recommendations. Pressure ulceration noted around tracheostomy --> ENT at bedside to evaluation, likely some component of invasive tumor growth. Palliative care consult for assist with symptom management and family moving forward. Primary oncologist office made aware of referral. O/N: weaning down on Prop, no episodes of respiratory distress or desats overnight. Febrile to 101.3, sputum and blood cultures ordered. 500cc LR bolus given for some oliguria and rising Cr. 2/11: Given 500cc NS for oliguria with good response. Vanco level yesterday 25, likely cause of worsening aki, on hold. UOP & lytes remains stable. Insulin adjusted per diabetes team. PSV as tolerated (doing well). CPT added. Confirmed with ENT pulm recs for blue portex suctionaid trach- no plans to switch at this time. Bowel regimen added. Plan keep on neo if low dose but if inc, consider changing to levo as likely then not in setting of sedation. Family updated on phone. Confirmed w heme- no AC rec for R IJ thrombus until plt > 50. Was going to attempt to place pt on TM overnight as he did very well on PSV x 3hrs, however, pt had anxiety attack regarding his cancer and prognosis, he requested to sleep during night and was agreeable to TM in  am. O/N: Patient agrees to PSV overnight which he did very well on. Agreeable to getting OOB to chair and possibly trach mask in AM but wants to stay on vent tonight. Off Neo by AM.  2/12: trial trach mask as tolerated this AM. Adjusted insulin regimen per the diabetes management service. Sputum now with staph, awaiting sensitivities. More loose stools today; nursing placed rectal tube and held bowel regimen.  ON: Anxious at times, feeling like he cannot breath but doing well on TM. Frequent suctioning. Increased standing. FIO2 increased to 40%.  insulin to 14U per endo reccs. 2/13: continue to adjust insulin regimen, now 20u regular q6. Cr continues to uptrend 2.57 --> 2.63 and repeat vanco level still elevated 24.6; pharmacy to continue dosing adjustments. Otherwise, remains on trach mask. Sputum with MRSA --> d/c ceftriaxone. ON: increased standing insulin to 24U. 2/14: Will discuss [facility] plan with hematology now that PLT > 50 x2 checks. Will also d/c vancomycin today (10d of therapy thus far). AKI appears to have peaked at 2.57 -> 2.63 -> 2.47. Awaiting transfer to the oncology service on NP 15. Okay to start [facility], but will hold off today given more bloody secretions. 2/15: Plan to start palliative RT today. AM session aborted due to extreme anxiety (despite premedication with IV ativan), tried again in the PM with 10mg  IV Valium + 3mg  IV ativan, procedure again aborted due to extreme anxiety and inability to tolerate. Pending discussion with rad onc in AM re: utility of ongoing attempts at RT. Pending transfer to NP15 on med onc service.  O/N: increasingly anxious / restless, but able to re-direct and calm down with PRN ativan. Liquid stools, placed rectal tube. Will start banatrol and check CBC in AM, check c dif. This had previously improved by holding bowel regimen.2/16: Acutely increased WOB, HR 130s, SBP 170s --> ENT bedside scope with trach patent, though notable secretions --> suctioned with minimal improvement; placed back on ventilator PSV 10 / +5 with immediate improvement in VS and respiratory distress. Precedex gtt started to assist more frequent suctioning. CXR with LLL consolidation --> resend sputum, and restart Vanco (close trough monitoring with AKI) given known MRSA PNA. Increased diarrhea, cdiff negative, transition to Vital TF. LR 500cc for hemoconcentration of labs, tachycardia. Appreciate radiation oncology; sedated patient with Propofol gtt, bolus x1 with good effect --> able to tolerate 15 minute radiation session; plan to repeat Thursday, Friday, Monday, and Tuesday. 2/17: Psych consulted for depression--> Doxepin added, Klonopin added. Hypernatremia--> D5W and free water flushes added. Ceftaz added for GNR in sputum. Plan for family discussion this PM (see separate note) and radiation this PM. Foley replaced for retention. ON: Tolerated PSV 15/5. Repeat Na 148.2/18: Plan for radiation this afternoon. Rectal tube removed.. Hgb 6.3--> transfused 1un PRBCs. Discussed care with sister and niece at bedside, remain full code and plan to continue radiation course through Tuesday (see family communication note).2/19: PSV weaned 15 --> 12/+5. Start Clonidine and discontinue Precedex gtt. Discontinue Gabapentin and Dilaudid IV, decrease Oxycodone to 2.5 / 5. Na improved 144, discontinue D5W; sugars should improve, maintain current regimen per DM team. GNR per micro lab not pseudomonas, klebsiella, or other enteric organism; would have speciated --> change Ceftaz to PO Cipro, to complete course 2/23 for one week. Cr again increased with Vanco level over 20 --> next dose held, monitor level closely. Hgb remains low despite 1U yesterday, 6.3 --> 7.0; start multivitamins and check with Heme. ON: Low grade fevers stable. Given ativan  0.5mg  in place of klonipin with good anxiolytic effect. Transfuse 1u PRBC (likely half unit) for Hgb 6.7.2/20: Acute respiratory distress this morning after turn, minimal secretions, did not improve with increased pressure or VAC --> bagged x5 mins with improvement, placed on PCV, IP 20 --> CXR pending, start Propofol, add back Ativan and Morphine PRN. Appreciate heme assistance with persistent anemia. Cr continues to rise following elevated vanco trough; hold any further doses, level will likely remain therapeutic to final date of course (2/22) --> unfortunately, patient did receive additional dose of Vanco 2g per pharmacy order this morning; discontinued placeholder, consider Doxycycline if Vanco level subtherapeutic prior to end date.ON: Remains on PCV. Remained on low dose levo 0.02 throughout night. Manual suctioning by RT overnight with thin tan secretions. AM labs notable for Hgb 9.1(6.7) after 1uPRBC transfusion yesterday. Worsening AKI Cr 2.51 (2.44).2/21: Patient alert and breathing comfortably this morning --> wean PEEP back to 5, consider retrial of PSV today pending radiation plans. Free water flushes discontinued, hypernatremia resolved. Worsening AKI from Vanco; since discontinued, monitor UOP. LR 250cc, wean off Levo gtt. Appreciate palliative care assistance after event yesterday, patient requesting further discussion. Lantus changed to 15U QHS, 10U QAM and bolus increased to 28U Q6.ON: Acute respiratory distress at change of shift, lost tidal volumes and desaturated to high 80s requiring bagging, recovered. Pt expressing that he cannot breathe, feels obstructed --> suctioned with 10Fr suction w/ resistance met at 20cm and minimal secretions. ENT at bedside for scope --> thick secretions noted but airway patent.. Noted that Bivona trach was retracted, advanced to proper position by ENT. Per ENT, tracheal tissue proximal to bivona tip very flimsy. Likely tissue collapsing against distal tip with trach retracted in poor position causing obstruction. Patient acutely distressed and agitated during event, propofol gtt restarted will plan to wean off tonight. Start decadron 8mg  q12 per onc recs for peritumoral inflammation. Hyperkalemic in AM to 5.7 --> EKG unchanged, insulin/dextrose/Ca2+ x 1. Transfuse 1u PRBC for hgb 6.9. 2/22: Keep on PCV today and continue propofol gtt. Start Insulin gtt and increase Lantus to 20u BID. No bowel movements --> discontinue banatrol. Plan for radiation today. Discussed transfer to MICU no beds available, will initiate transfer once a bed becomes available. Plan for multidisciplinary family meeting this week. ON: Rested on PCV, low dose prop. WBC count 12 (7) this am2/23: Plan for last palliative radiation session later today. RR decreased to 12. Given rising white count and increased in line suctioning requirement Sputum culture sent and CXR obtained. Will stop Cipro and start ceftriaxone. 1L LR bolus given for AKI, rising BUN as well as likely concentration on CBC. Plan to start weaning off prop after radiation and attempt PSV trials again. Family discussion with all teams Thursday or Friday. CPT added back given increased secretions attempted to add back HTS nebs however hospital is completely out per pharmacy discussion. Respiratory culture again growing GPC and GNR. ON: BG stable on ins gtt. NAE2/24: This AM, less responsive (GCS 10T) and BP down to 60/40s, but also worsening vent synchrony --> increased oxycodone and dilaudid followed by 1000 LR bolus with good effect and pan-cx given worsening leukocytosis. Started vanc / zosyn.  Later, self d/c bivona trach and desat to 60s; SICU attending at bedside and replaced bivona trach without difficulty and placed back on the ventilator.  ENT at bedside to scope and confirmed trach positioning. Will plan for family meeting tomorrow. ON: NAE2/25: GCS 11T, but still withdrawn / lethargic. Will  d/c PRN ativan to minimize sedation and continue to wean propofol as tolerated, though patient has been mentating on higher doses than this. Still low dose levo requiremements. Bivona trach dislodged ~4cm today, obturator placed and re-advanced without issue. Trial PSV 15/5, tolerating with Tv 400s. D/w ENT, will try to work on custom trach for him. Plan for family meeting this afternoon for goals of care discussions. Rested on [facility] settings after ~6 hours of 15/5 (work of breathing + tachypnea 40s). Decreased lantus to 15 BID per endocrine. In afternoon, became more agitated / tachy requiring additional PRN 1mg  ativan + 1mg  versed + dilaudid. See ACP note for family meeting discussion. O/N: NAE. 2/26: Attempt to wean propofol to 35, increased agitation and tachypnea --> increase Prop back to 40 and oxycodone given with improvement. Plan for PSV 15/5 x 4 hours today as tolerated. Increased Lantus to 20u BID. Discontinued Zosyn. Discussed with pharmacy switching Vanco to Clinda given worsening renal function --> recommend continuing Vanco as high risk of C.diff with Clinda, will recheck random vanco level tomorrow @ 0500. 1L NS given for rise in creatinine. O/N: Vanc trough 21.4. 2/27: Plan for PSV 15/5 x 6 hours as tolerated. 1L LR given for high insensible losses. Change Vanco to PO Doxy. Increase Lantus to 30u BID, d/c insulin gtt and start HDISS.  O/N: K 5.7 --> insulin/dextrose/Ca given. 2/28: Remains on Levo. K 5.7--> 5.9--> Insulin/ Dextrose/ Ca x given x2 and Lokelma x1. Hyperglycemic--> Lantus 30 daily, HDISS, Regular Insulin 9un Q6hours. Decreased IP to 18, tolerating with TV 400s-500s. Heme consulted for ongoing thrombocytopenia, low concern for HIT, no plan to treat ITP unless plt<30k or bleeding.  O/N: At shift change insulin Gtt restarted, Significant stool output after lokelma, K improving to 5.4, will trend. AM labs with Plt 35, HSQ held following heme reccs. 3/1: [name] noted to be dislodged, re-advanced and ENT consulted with no acute interventions. CXR showing trach in stable position. Decreased IP to 15. Will trial on PSV 15/5 x6hours BID. Afternoon plt stable at 34; will continue to hold HSQ. K stably elevated at 5.5; no acute intervention given insulin gtt and large rectal tube output. Levo weaned off. O/N: Rested on Pressure control after 6 hours PSV. NAE. Repeat plt stable at 34. 3/2: Weaning prop and transitioning to dex to decrease sedation and aid in anxiety/agitation management. Will trial on PSV 10/5 x6hr BID today, resting on PCV. Psych re-engaged due to withdrawn affect/mood this AM; d/ced doxepin to avoid further delirium/AMS. Acutely tachycardic, tachypneaic, and hypoxic w/ O2 sat in 80's on PSV trial --> prop gtt increased and placed back on PCV with IP 12. Weaned off prop, on dex gtt. ON: Rested on PCV. K 6.1 --> 1g Ca, insulin and dextrose given, EKG obtained.3/3: Potassium 6.0 --> insulin/dextrose, Lokelma 10g x 1 given. Held off on further calcium as serum calcium 10.3 and corrected Calcium is ~11.6. Bicarb 19 --> ordered Na Bicarb tabs 650mg  TID. 500cc NS x 1  Given for goal even to +500. Tube feeds changed to Nepro. Trial of PSV 5/5 --> last ~ then became tachypneic and put back on 10/5. Tolerated 10/5 x 6 hours, rested on PCV. Repeat potassium 5.8 --> 5u insulin/dextrose. Will plan for additional dose of Lokelma at 6pm. Placed on second 110.5 PSV trial ~6pm, plan for 6hrs as tolerated. O/N: Completed PSV trial.  Repeat K - 6 --> insulin/dextrose and lokelma given. 3/4: Patient repositioned with Trach pulled out halfway, replaced without  difficulty. CXR post trach in good position. 1st PSV 10/5 trial for 2.5 hours, stopped due to tachypnea. Plan for 2nd trial for goal 6 hours. 4pm K 5.6. O/N: Tolerated second PSV trial, placed on resting settings at 21:40.  K 5.8, lokelma and calcium gluconate given. 3/5: Tolerated PSV trial well. Plan to continue with PSV 10/5 q6 BID today. K 5.8 on PM labs --> additional lokelma given, held on Ca2+ given serum level at ULN. Reviewed med list with pharmacy, no clear pharmacologic cause of hyperkalemia. Will plan to check again at [place]. Q1 suctioning of thin tan secretions. Review of Allergies/Meds/Hx: I have reviewed the patient's: allergies, past medical history, past surgical history, family history, social history, prior to admission medicationsObjective: Vitals:I have reviewed the patient's current vital signs as documented in the EMRTemp:  [99.32 ?F (37.4 ?C)-100.22 ?F (37.9 ?C)] 99.32 ?F (37.4 ?C)Pulse:  [78-116] 106Resp:  [8-37] 26BP: (95-149)/(54-89) 149/89SpO2:  [97 %-100 %] 100 %SpO2: 100 %Device (Oxygen Therapy): mechanical ventilatorI/O's:I have reviewed the patient's current I&O's as documented in the EMR.Gross Totals (Last 24 hours) at 11/07/2020 0351Last data filed at 11/07/2020 0300Intake 2459.48 ml Output 3155 ml Net -695.52 ml Physical ExamConstitutional: Middle aged trached man in NADHEENT: NCAT. PERRL, EOMI. External ears normal. Bivona trach midline with xeroform distal skinCardiovascular: RRR, no MRG appreciated. Distal pulses are 2+ bilaterally.Pulmonary: Trached on vent. Tachypneac at times. Suctioned for small amount frothy secretions. Lungs clear after suctioningAbdominal: Soft, obese, non distended, non tender to palpation, +BS. PEG Tube site intactGenitourinary: Foley catheter in place, draining clear yellow urine.Musculoskeletal: BUE strength 4/5, BLE 2/5Neurological: Intermittent opens eyes, nods head, FSC. Agitated at timesSkin: Warm and dry.Psychiatric: Unable to assessLabs: Refer to EMR. I have reviewed the patient's labs within the last 24 hours; see assessment and plan below for significant abnormals.Microbiology:Refer to EMR. I have reviewed all new results within the last 24 hours; see assessment and plan below.Diagnostics:Refer to EMR. I have reviewed all new results within the last 24 hours; see assessment and plan below.Chest ZO:XWRUE to EMR. I have reviewed all new results within the last 24 hours; see assessment and plan below.Abdominal AV:WUJWJ to EMR. I have reviewed all new results within the last 24 hours; see assessment and plan below.ECG/Tele Events: I have reviewed the patient's ECG and telemetry as resulted in the EMR.Assessment/Plan Neurologic: Analgesia/sedation - Tylenol 650 Q6 PRN - Oxycodone 10 / 15 Q4 PRN - Dilaudid 1 PRN breakthrough- Precedex gtt Anxiety/depression- s/p clonidine taper (stopped early 2/2 hypotension)- Clonazepam 0.5mg  BID, consider d/c if mental status continues to be depressed- Ativan 0.5mg  IV Q6 PRN acute agitation- Appreciate palliative care and psych consults?ENT: L laryngeal SCC, invading tracheostomy: s/p trach exchange 10/10/20 and biopsy 10/12/20- Bivona trach to bypass obstruction 	- ENT planning for custom bivona trach- Unfortunately, there are no surgical / curative options- Palliative radiation (2/16, 2/17, 2/18, 2/21, 2/22, 2/23)	- Required PCV with full propofol sedation to tolerate?Cardiac: Septic shock- s/p Norepinephrine gtt(2/24- 3/1) to maintain MAP>65Hx of HTN / HLD- Hold home lisinopril, lasix, crestor given AKI?Respiratory:  Respiratory failure (circumferential tumor invasion of stoma, mucous plugging / trach occlusion)- PC/AC: IP 12, PEEP +5- PSV 10/5 x 6 hours BID, weaning IP as tolerated - s/p Decadron 8mg  BID for tumor inflammation (2/22 -2/24) per med oncology- Albuterol Q6- Could consider mucomyst nebsGI: Diarrhea, resolved - PEG in place with TF: Nepro@ 65 ml/hour- Pepcid while vented?Renal: AKI on CKD (baseline Cr 1.5-1.6), 2/2 supratherapeutic vancomycin level- Cr continues to rise, continue to trend daily-  Foley replaced 2/17 for urinary retention- Sodium Bicarbonate tabs 1300 TID, watch for alkalosis Hyperkalemia:- Medical management with Lokelma and Calcium if needed- Trend lytes- Lokelma (can give up to 10mg  TID)- Reviewed medication list with pharmacy, no clear pharmacologic culprit- Consider nephrology consult for input on hyperkalemia sourceHypercalcemia- Corrected Calcium >11- Avoid further Calcium repletion at this time Infectious Disease:  MRSA pneumonia- 2/24 sputum: 4+ MRSA, 2+ MRSA- 2/23 sputum: MRSA- Vanco  (2/24- 2/27), de-escalate to Doxy (2/27 - 3/5) total 10d course - S/p Zosyn (2/24- 2/26)Hx MRSA PNA; 4+ GNR PNA- s/p Vanco (2/5 - 2/14, 2/16 - 2/22)- s/p Ceftaz (2/17- 2/19)- s/p Cipro (2/19 - 2/23)?Hematology:Critical illness anemia- Sanguinous secretions, expected given tumor burden and coagulopathy, may worsen with radiation and baseline hematologic disease- Multivitamins and cofactors- Maintain Hgb >7?Hx of chronic ITP, Waldenstrom macroglobulinemia- Transfuse platelets if <30k, bleeding, or need for invasive procedure per heme- No need to treat ITP unless plts<30k- HSQ, consider holding Hep SC if plts<50k - Appreciate hematology recommendations?R IJ thrombus - High risk heparin gtt when plt consistently >50 and no more bloody secretions from tracheostomy- Maintain SCDs at all times?Endocrine: Hx T2DM- Insulin gtt restarted 2/28- Lantus 15 units BID- Hold Regular insulin 9un SC Q6hours- Hold HDISS- HOLD home Metformin, Lantus 64u, Apidra 20u TID w meals- Appreciate diabetes team recs, signed off for now. Reconsult when appropriateMusculoskeletal: Generalized deconditioning- OOB daily, PT/OT evals- Turn and reposition Q2 hours, PUP dressing?Device Review:- R IJ Port (08/12/20) per guidelines, nonocclusive DVT does not require removal of line- PEG (09/14/20)- Bivona trach (10/10/20)?Code Status / Dispo:- Transition to DNR (see ACP note 2/25)- Per ENT, patient to transfer to medical oncology after SICU- Appreciate palliative care assistance with GOC discussionsNotifications : For questions please call SICU Covering Provider in Epic EMR (primary) SICU [facility] Dynamic Roles (secondary)Y[facility] 7-1 APP			[phone] 6-1 Frontside		[phone] 6-1 Backside		[phone] [place]-2 APP			[phone]:Jolanta Cabeza PA-C3/01/2021

## 2020-11-07 NOTE — Progress Notes
Surgical Intensive Care Unit  AttendingI have personally performed a face to face diagnostic evaluation on this patient, reviewed the chart, available data and care plan and have personally formulated the plan outlined below. ?I have seen this patient in addition to the Surgical ICU advanced practitioner given patient's complexity and associated need for critical care.??54 y.o.?male?admitted to the surgical intensive care unit on 10/14/2020 for?on?10/09/2020?for respiratory failure. 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 ?The patient's hospital course was significant for a knee tracheostomy exchange 10/10/20, complicated by episodes of hypoxia requiring suctioning of mucus plugs and transferred to the surgical intensive care unit on 10/14/2020. ?The patient received palliative radiation treatments, completed on 10/28/20.??His hospital course has been notable for altered mental status and decreased responsiveness, continued vasopressor requirement, pressure support trials, ongoing antibiotic treatment for possible aspiration pneumonia.Hospital Day 29 Past Medical History: Diagnosis Date ? Diabetes mellitus (HC Code) (HC CODE)  ? Hypercholesteremia  ? Hypertension  No past surgical history on file.Scheduled Meds:Current Facility-Administered Medications Medication Dose Route Frequency Provider Last Rate Last Admin ? albuterol neb sol 2.5 mg/3 mL (0.083%) (PROVENTIL,VENTOLIN)  2.5 mg Nebulization Q6H WA Martin, [place], [place]   2.5 mg at 11/07/20 0981 ? ascorbic acid (vitamin C) (VITAMIN C) tablet 250 mg 250 mg Per G Tube Daily Jennye Boroughs, PA   250 mg at 11/07/20 1914 ? chlorhexidine gluconate (PERIDEX) 0.12 % solution 15 mL  15 mL Mouth/Throat Q12H Leonia Reeves, PA   15 mL at 11/07/20 7829 ? clonazePAM (KlonoPIN) tablet 0.5 mg  0.5 mg Per J Tube BID Mat Carne, APRN   0.5 mg at 11/07/20 5621 ? doxycycline TAB/CAP 100 mg  100 mg Per G Tube Q12H Mat Carne, APRN   100 mg at 11/07/20 3086 ? famotidine (PEPCID) tablet 20 mg  20 mg Per J Tube Daily Cusick, Lauren, PA   20 mg at 11/07/20 5784 ? folic acid (FOLVITE) tablet 1 mg  1 mg Per G Tube Daily [employer] [place], [place], [place]   1 mg at 11/07/20 6962 ? heparin (porcine) injection 7,500 Units  7,500 Units Subcutaneous Q8H Madlener, [place], [place]   7,500 Units at 11/07/20 9528 ? insulin glargine (Semglee,Lantus) injection 15 Units  15 Units Subcutaneous Q12H Sharyne Richters, PA   15 Units at 11/07/20 4132 ? miconazole nitrate (SECURA EXTRA THICK ANTIFUNGAL) 2 % cream   Topical (Top) BID Lisette Abu, [place]   Given at 11/07/20 4401 ? midazolam (PF) (VERSED) 1 mg/mL injection          ? Prosource no carb 15 gram protein and 60 kcal  1 packet Per G Tube TID Bettey Costa, PA   1 packet at 11/07/20 0272 ? sodium bicarbonate tablet 1,300 mg  1,300 mg Per G Tube TID Gearldine Shown, PA   1,300 mg at 11/07/20 5366 ? thiamine (VITAMIN B1) tablet 50 mg  50 mg Per G Tube Daily Jennye Boroughs, PA   50 mg at 11/07/20 0806 Continuous Infusions:? dexmedetomidine 0.7 mcg/kg/hr (11/07/20 0800) ? insulin regular human 100 units in sodium chloride 0.9 % 100 mL 5.5 mL/hr at 11/07/20 0800 PRN Meds: acetaminophen, Initiate Insulin Infusion Protocol for Adult Patients (Nurse-Driven) **AND** [COMPLETED] insulin regular human **AND** insulin regular  human 100 units in sodium chloride 0.9 % 100 mL **AND** POC Glucose (Fingerstick) **AND** Nursing communication **AND** dextrose injection **AND** dextrose injection, glucagon, HYDROmorphone, LORazepam, oxyCODONE **OR** oxyCODONE, zinc oxideVITALS:Temp (24hrs), Avg:99.7 ?F (37.6 ?C), Min:99.32 ?F (37.4 ?C), Max:100.22 ?F (37.9 ?C)Vitals:  11/07/20 0700 11/07/20 0730 11/07/20 0800 11/07/20 0803 BP: (!) 110/59  (!) 144/81  Pulse: 82 84 (!) 112 (!) 110 Resp: 17 19 (!) 22 17 Temp: 99.5 ?F (37.5 ?C) 99.5 ?F (37.5 ?C) 99.32 ?F (37.4 ?C) 99.32 ?F (37.4 ?C) TempSrc:  Bladder   SpO2:  100% 100% 100% Weight:     Height:     Intake/Output Summary (Last 24 hours) at 11/07/2020 0843Last data filed at 11/07/2020 0800Gross per 24 hour Intake 2348.23 ml Output 3475 ml Net -1126.77 ml Exam unchanged as of 11/07/2020 with exceptions noted belowPhysical ExamLabs Last 24 hours:Most Recent Result Component Value Date/Time  Sodium 141 11/07/2020 04:55 AM  Potassium 5.8 (H) 11/07/2020 04:55 AM  Chloride 112 (H) 11/07/2020 04:55 AM  CO2 21 11/07/2020 04:55 AM  BUN 92 (H) 11/07/2020 04:55 AM  Creatinine 2.04 (H) 11/07/2020 04:55 AM  WBC 9.7 11/07/2020 04:55 AM  Hemoglobin 7.2 (L) 11/07/2020 04:55 AM  Hematocrit 24.10 (L) 11/07/2020 04:55 AM  Platelets 84 (L) 11/07/2020 04:55 AM Imaging Last 24 hours:XR Chest PA or APResult Date: 3/4/2022Tracheostomy tube 4.4 cm above carina. Pulmonary vascular congestion, left pleural effusion. Reported And Signed By: Maudie Flakes, MD  [facility] Radiology and Biomedical ImagingOvernight Events:--?Continued hyperkalemia requiring treatment-- Tolerating PS 10/5Infectious Disease/Sepsis:?MRSA pneumonia-- Continue to monitor WBC count and fever curve-- Continue?doxycycline (end date 11/07/20)-- Prior antibiotics:??Vancomycin (2/5 - 2/14, 2/16 - 2/22);?ceftazidime?(2/17 - 2/19);?ciprofloxacin?(2/19 - 2/23)HEENT:Squamous cell laryngeal cancer invading tracheostomy, status post tracheostomy exchange?10/10/20?and biopsy on 10/12/20-- Continue?[place]?tracheostomy to bypass obstruction, maintain additional tracheostomy at the bedside-- Awaiting custom trach as per ENT recommendations-- Completed course of palliative radiation as per oncology recommendations?(2/16 - 2/23)-- Continue?to follow-up Oncology recommendations, we appreciate the consult-- Continue?to follow-up?with a primary team (ENT) recommendations; no surgical management at this time as per their recommendationsCardiac:?Septic shock, resolved-- Continue to monitor hemodynamic status--?Weaned of?norepinephrine?3/1 at 2PM-- Continue?goal MAP > 65 mmHgHistory of hypertension, hyperlipidemia-- Continue?to hold home lisinopril, Lasix, Crestor until hemodynamically stable and tolerating PORespiratory:?Respiratory failure with circumferential squamous cell laryngeal cancer invading stoma, with intermittent mucus plugging/tracheal occlusion-- Wean off?mechanical ventilation?as tolerated-- Encourage IS and pulmonary toilet when able-- Continue SBT-- Follow-up with ENT re: customized trach-- Continue?albuterol nebs q6 hours while awake-- Continue?chest physiotherapy q6 hrs?hours while awake-- Continue to follow-up Primary Team (ENT) recommendationRenal, Fluids/Electrolytes:?AKI on CKD (baseline creatinine 1.5 to 1.6), with recent supratherapeutic vancomycin level, now?stable-- Maintain Foley catheter for the strict monitoring of I/O in this critically ill patient;?placed for urinary retention on?10/22/20, will perform voiding trial when more stableHyperkalemia--?Continue to treat and monitor potassium levels-- Replete bicarbonate as neededHematology:?????Anemia, stable-- Continue to monitor H/HHistory of chronic ITP, [place] storms macroglobulinemia; thrombocytopenia-- Consult Hematology today given the downtrending platelets-- Possible steroids vs IVIG with continued thrombocytopenia as per Hematology recommendations-- Continue?to follow-up Hematology recommendations; as per their evaluation and recent response to respiratory steroids, thrombocytopenia possibly secondary to ITP, hold HITT panel held as per Hematology recommendationsRight IJ thrombus-- Continue?to hold off on systemic anticoagulation given thrombocytopenia, waiting Hematology recommendations--?Restart SQH today, hold systemic anticoagulationEndocrine:??Diabetes, hyperglycemia-- Insulin protocol for hyperglycemia-- Continue to monitor FSs-- Continue to hold home metformin, Lantus,?Apidra-- Continue?to follow-up endocrinology/diabetes Team recommendations, we appreciate the consultGI/Nutrition:???Diarrhea, improved-- Diet as per Primary Team?via PEG-- Continue bowel regimenNeurologic:??Anxiety, depression; delirium without focal deficit-- Continue?clonazepam 0.5mg  via  PEG BID-- Continue?lorazepam?0.5mg  IV q6hrs PRN?severe anxiety-- Continue?to follow-up?Psychiatry recommendations, we appreciate the consult-- Continue?to follow-up?Palliative?Care recommendations, we appreciate the consultPain-- Continue to monitor neurologic status-- Continue acetaminophen 650mg  via PEG q4 hours PRN pain, fever-- Continue?oxycodone 10mg  and 90m via PEG q4 hrs PRN moderate to severe pain, respectively-- Continue?hydromorphone 1mg  IV q3hrs PRN?breakthrough painSedation while intubated-- Continue?dexmedetomidine; wean off as toleratedPain, Sedation Management:Pain is being adequately controlled with the current regimenSedation is being adequately addressed by this patient's current regimen Musculoskeletal:??-- PT/OT when ableSkin/wound: Frequent repositioning to mitigate against pressure ulcerationGoals of Care/Advance Directives:Code status:???DNRLines:--?PIVs-- Right port (08/12/20)-- PEG (09/14/20)-- Trach--?Foley catheter?(10/22/20)Prophylaxis: ?-- VAP: HOB elevation;?chlorhexidine-- GI: HOB elevation; famotidine-- VTE: SCD; SQH?Plan discussed with:??Primary Service, Consult Services, Critical Care Nursing and Resident, Respiratory Therapy, Pharmacy, and the Patient. ?The patient's family was not present for rounds this AM.This patient is critically ill as indicated by the following Acute Respiratory FailureThe patient has required 41 minutes of my undivided attention during the past 24 hours for one of the previous mentioned life threatening diagnoses. The critical care interventions have included: Management of Acute Respiratory Failure with initiation of spontaneous breathing trials Lonia Mad, MD SICU Attending3/01/2021

## 2020-11-07 NOTE — Plan of Care
Spontaneous Breathing Trial performed for 6 hours on the settings :CPAP: 5 cmH2OPressure Support: 10 cmH2OFiO2: 40%Patient tolerated trial well. Intermittently tachypneic  to high 20s/low 30s, resolves on own or with suctioning. Parameters:  RR: 21Vt: 460 mlVE: 9.57F/Vt: 45.6Vitals:HR-Pulse: (!) 93 SpO2-SpO2: 100 %Trial discontinued due to time limit reached. Placed back on resting Pressure AC settings for overnight.

## 2020-11-08 LAB — CBC WITH AUTO DIFFERENTIAL
BKR WAM ABSOLUTE IMMATURE GRANULOCYTES.: 0.24 x 1000/ÂµL (ref 0.00–0.30)
BKR WAM ABSOLUTE LYMPHOCYTE COUNT.: 0.25 x 1000/ÂµL — ABNORMAL LOW (ref 0.60–3.70)
BKR WAM ABSOLUTE NRBC (2 DEC): 0 x 1000/ÂµL (ref 0.00–1.00)
BKR WAM ANALYZER ANC: 9.66 x 1000/ÂµL — ABNORMAL HIGH (ref 2.00–7.60)
BKR WAM BASOPHIL ABSOLUTE COUNT.: 0.03 x 1000/ÂµL (ref 0.00–1.00)
BKR WAM BASOPHILS: 0.3 % (ref 0.0–1.4)
BKR WAM EOSINOPHIL ABSOLUTE COUNT.: 0.3 x 1000/ÂµL (ref 0.00–1.00)
BKR WAM EOSINOPHILS: 2.6 % (ref 0.0–5.0)
BKR WAM HEMATOCRIT (2 DEC): 24.9 % — ABNORMAL LOW (ref 38.50–50.00)
BKR WAM HEMOGLOBIN: 7.5 g/dL — ABNORMAL LOW (ref 13.2–17.1)
BKR WAM IMMATURE GRANULOCYTES: 2.1 % — ABNORMAL HIGH (ref 0.0–1.0)
BKR WAM LYMPHOCYTES: 2.2 % — ABNORMAL LOW (ref 17.0–50.0)
BKR WAM MCH (PG): 27.1 pg (ref 27.0–33.0)
BKR WAM MCHC: 30.1 g/dL — ABNORMAL LOW (ref 31.0–36.0)
BKR WAM MCV: 89.9 fL (ref 80.0–100.0)
BKR WAM MONOCYTE ABSOLUTE COUNT.: 0.91 x 1000/ÂµL (ref 0.00–1.00)
BKR WAM MONOCYTES: 8 % (ref 4.0–12.0)
BKR WAM MPV: 11.4 fL (ref 8.0–12.0)
BKR WAM NEUTROPHILS: 84.8 % — ABNORMAL HIGH (ref 39.0–72.0)
BKR WAM NUCLEATED RED BLOOD CELLS: 0 % (ref 0.0–1.0)
BKR WAM PLATELETS: 98 x1000/ÂµL — ABNORMAL LOW (ref 150–420)
BKR WAM RDW-CV: 17.4 % — ABNORMAL HIGH (ref 11.0–15.0)
BKR WAM RED BLOOD CELL COUNT.: 2.77 M/ÂµL — ABNORMAL LOW (ref 4.00–6.00)
BKR WAM WHITE BLOOD CELL COUNT: 11.4 x1000/ÂµL — ABNORMAL HIGH (ref 4.0–11.0)

## 2020-11-08 LAB — BASIC METABOLIC PANEL
BKR ANION GAP: 10 (ref 7–17)
BKR ANION GAP: 11 (ref 7–17)
BKR BLOOD UREA NITROGEN: 89 mg/dL — ABNORMAL HIGH (ref 6–20)
BKR BLOOD UREA NITROGEN: 94 mg/dL — ABNORMAL HIGH (ref 6–20)
BKR BUN / CREAT RATIO: 43.4 — ABNORMAL HIGH (ref 8.0–23.0)
BKR BUN / CREAT RATIO: 43.7 — ABNORMAL HIGH (ref 8.0–23.0)
BKR CALCIUM: 9.7 mg/dL (ref 8.8–10.2)
BKR CALCIUM: 10.1 mg/dL (ref 8.8–10.2)
BKR CHLORIDE: 113 mmol/L — ABNORMAL HIGH (ref 98–107)
BKR CHLORIDE: 112 mmol/L — ABNORMAL HIGH (ref 98–107)
BKR CO2: 21 mmol/L (ref 20–30)
BKR CO2: 22 mmol/L (ref 20–30)
BKR CREATININE: 2.05 mg/dL — ABNORMAL HIGH (ref 0.40–1.30)
BKR CREATININE: 2.15 mg/dL — ABNORMAL HIGH (ref 0.40–1.30)
BKR EGFR (AFR AMER): 41 mL/min/{1.73_m2} (ref >60)
BKR EGFR (AFR AMER): 39 mL/min/{1.73_m2} (ref >60)
BKR EGFR (NON AFRICAN AMERICAN): 34 mL/min/{1.73_m2} (ref >60)
BKR EGFR (NON AFRICAN AMERICAN): 32 mL/min/{1.73_m2} (ref >60)
BKR GLUCOSE: 177 mg/dL — ABNORMAL HIGH (ref 70–100)
BKR GLUCOSE: 208 mg/dL — ABNORMAL HIGH (ref 70–100)
BKR POTASSIUM: 5.7 mmol/L — ABNORMAL HIGH (ref 3.3–5.3)
BKR POTASSIUM: 5.5 mmol/L — ABNORMAL HIGH (ref 3.3–5.3)
BKR SODIUM: 144 mmol/L (ref 136–144)
BKR SODIUM: 145 mmol/L — ABNORMAL HIGH (ref 136–144)

## 2020-11-08 LAB — PHOSPHORUS     (BH GH L LMW YH)
BKR PHOSPHORUS: 4.7 mg/dL — ABNORMAL HIGH (ref 2.2–4.5)
BKR PHOSPHORUS: 4.3 mg/dL (ref 2.2–4.5)

## 2020-11-08 LAB — MAGNESIUM
BKR MAGNESIUM: 2.2 mg/dL (ref 1.7–2.4)
BKR MAGNESIUM: 2.2 mg/dL (ref 1.7–2.4)

## 2020-11-08 LAB — IMMATURE PLATELET FRACTION (BH GH LMW YH)
BKR WAM IPF, ABSOLUTE: 3.5 x1000/ÂµL (ref <20.0)
BKR WAM IPF: 3.6 % (ref 1.2–8.6)

## 2020-11-08 MED ORDER — SODIUM ZIRCONIUM CYCLOSILICATE 10 GRAM ORAL POWDER PACKET
10 gram | Freq: Once | ORAL | Status: CP
Start: 2020-11-08 — End: ?
  Administered 2020-11-08: 22:00:00 10 gram via ORAL

## 2020-11-08 MED ORDER — OXYCODONE IMMEDIATE RELEASE 5 MG TABLET
5 mg | GASTROSTOMY | Status: AC | PRN
Start: 2020-11-08 — End: ?

## 2020-11-08 MED ORDER — OXYCODONE IMMEDIATE RELEASE 5 MG TABLET
5 mg | GASTROSTOMY | Status: CP | PRN
Start: 2020-11-08 — End: ?
  Administered 2020-11-08 – 2020-11-14 (×29): 5 mg via GASTROSTOMY

## 2020-11-08 MED ORDER — LACTULOSE 20 GRAM/30 ML ORAL SOLUTION
2030 gram/30 mL | Freq: Once | GASTROSTOMY | Status: CP
Start: 2020-11-08 — End: ?
  Administered 2020-11-09: 02:00:00 20 mL via GASTROSTOMY

## 2020-11-08 MED ORDER — SODIUM ZIRCONIUM CYCLOSILICATE 10 GRAM ORAL POWDER PACKET
10 gram | Freq: Once | ORAL | Status: CP
Start: 2020-11-08 — End: ?
  Administered 2020-11-08: 08:00:00 10 gram via ORAL

## 2020-11-08 MED ORDER — SENNOSIDES 8.6 MG-DOCUSATE SODIUM 50 MG TABLET
Freq: Once | JEJUNOSTOMY | Status: CP
Start: 2020-11-08 — End: ?
  Administered 2020-11-08: 20:00:00 via JEJUNOSTOMY

## 2020-11-08 NOTE — Plan of Care
Plan of Care Overview/ Patient Status    Attempted to wean Precedex gtt to start Klonopin taper, Precedex weaned to 0.5 mcg/kg/hr and pt became restless/agitated, tachypnic, tachycardic to 150's --> Per SICU PA, Precedex back to 0.7 mcg/kg/hr - PRN Ativan x1 given. RASS -1 to +2. Pt arouses to voice, follows commands, nods yes/no to simple question, pupils PERRL, MAE (BUE 3/5, BLE 2/5). CPOT in use to evaluate need for analgesia, PRN Oxycodone and Dilaudid given. Bilateral wrist restraints maintained for safety. Pt in NSR/sinus tach; HR 80-120's, BP 160-90/80-40's; tmax 100.4 - PRN Tylenol given. Afternoon BMP sent, K 5.5 --> Lokelma 10mg  given. Pt placed on CPAP 10/5 40% in AM - tolerating well, plan to keep on CPAP for as long as pt tolerates; with SpO2 >95%. Lung sounds diminished/coarse. Suctioning requirements Q2-Q3 hr, thin tan secretions. Trach site macerated --> cleansed, new xeroform and gauze in place. Pt remains on tube feed, Nepro at 65 ml/hr via PEG. Residuals checked Q4hr - <57ml. Rectal tube with no output this shift. Foley with > 75 ml/hr. Skin per flowsheet. Pt turned and repositioned q2hr. For additional information please see flowsheet.

## 2020-11-08 NOTE — Progress Notes
[facility]	SICU Progress NoteAttending Provider: Alric Quan, MDPost-Operative Day/Post Injury Day Procedure(s):2/5: [name] exchange (ENT) Hospital LOS: 30 days Interim History:  HPI: 55 y.o. male admitted to the SICU on 2/9 for ventilator management following an RRT on the oncology floor for desaturation and increased work of breathing while on trach mask and ultimately placed back on vent. ?Patient was initially a transfer from [facility] on 2/4 after trach and PEG when he had a partial obstruction of trach site from a new necrotic mass. He underwent trach exchange 2/5. Was on trach mask on oncology floor however had 2 episodes on floor requiring RRT for lavage and likely mucous plugging. ?Course Complicated by: - Incidental R IJ thrombus - Multiple episodes of presumed mucous plugging requiring RRT's ?PMH: Chronic ITP, Waldenstrom macroglobulinemia, HTN. HLD, DM, tobacco and alcohol use, Laryngeal SCC (weekly Cisplatin and RT)?PSH: None known ?Home meds: Lipitor, Pepcid, Lisinopril, Metformin, Oxycodone, miralax, Insulin U100 and Lantus SICU course:2/9: Arrives to SICU HDS, on vent PCV. Appears well. Transitioned to Pressure support on vent and tolerating well. O/N: Episode of patient feeling like not getting a full breath, extremely tachypenic and increased work of breathing and tachycardia, attempted suctioning and lavage without improvement, attempted to switch to PCV and VAC but due to dyssynchrony unable to tolerate, no desat but poor ventilation and TV and ETCO2 up to 100. ENT at bedside do not feel additional scope would benefit as he was just scoped a few hours ago, ultimately gave fentanyl push and started propofol for vent synchrony but despite max prop still dyssynchronous. Gave one dose of roccuronium once sedated for vent synchrony with good effect. Albuterol and HTS neb given and peaks improved from 50 to 30's. No further paralytic needed and now synchronous on VAC. Kept on propofol for vent synchrony. CXR reassuring and back to GCS 11T. Hypotension only following sedation and Rocc, Low dose Neo started. 2/10: Kept on higher sedation in AM for [place] A/P (for staging) and soft tissue neck. Weaning sedation and will plan for PSV trial once more awake. Scoped by ENT in AM, airway patent. Q1-2hr suction requirements of thick tan secretions. No further episodes of respiratory distress although sedation remains high. Tube feeds changed to continuous given significant hyperglycemia. Insulin regimen changed per diabetes team recommendations. Pressure ulceration noted around tracheostomy --> ENT at bedside to evaluation, likely some component of invasive tumor growth. Palliative care consult for assist with symptom management and family moving forward. Primary oncologist office made aware of referral. O/N: weaning down on Prop, no episodes of respiratory distress or desats overnight. Febrile to 101.3, sputum and blood cultures ordered. 500cc LR bolus given for some oliguria and rising Cr. 2/11: Given 500cc NS for oliguria with good response. Vanco level yesterday 25, likely cause of worsening aki, on hold. UOP & lytes remains stable. Insulin adjusted per diabetes team. PSV as tolerated (doing well). CPT added. Confirmed with ENT pulm recs for blue portex suctionaid trach- no plans to switch at this time. Bowel regimen added. Plan keep on neo if low dose but if inc, consider changing to levo as likely then not in setting of sedation. Family updated on phone. Confirmed w heme- no AC rec for R IJ thrombus until plt > 50. Was going to attempt to place pt on TM overnight as he did very well on PSV x 3hrs, however, pt had anxiety attack regarding his cancer and prognosis, he requested to sleep during night and was agreeable to TM in  am. O/N: Patient agrees to PSV overnight which he did very well on. Agreeable to getting OOB to chair and possibly trach mask in AM but wants to stay on vent tonight. Off Neo by AM.  2/12: trial trach mask as tolerated this AM. Adjusted insulin regimen per the diabetes management service. Sputum now with staph, awaiting sensitivities. More loose stools today; nursing placed rectal tube and held bowel regimen.  ON: Anxious at times, feeling like he cannot breath but doing well on TM. Frequent suctioning. Increased standing. FIO2 increased to 40%.  insulin to 14U per endo reccs. 2/13: continue to adjust insulin regimen, now 20u regular q6. Cr continues to uptrend 2.57 --> 2.63 and repeat vanco level still elevated 24.6; pharmacy to continue dosing adjustments. Otherwise, remains on trach mask. Sputum with MRSA --> d/c ceftriaxone. ON: increased standing insulin to 24U. 2/14: Will discuss [facility] plan with hematology now that PLT > 50 x2 checks. Will also d/c vancomycin today (10d of therapy thus far). AKI appears to have peaked at 2.57 -> 2.63 -> 2.47. Awaiting transfer to the oncology service on NP 15. Okay to start [facility], but will hold off today given more bloody secretions. 2/15: Plan to start palliative RT today. AM session aborted due to extreme anxiety (despite premedication with IV ativan), tried again in the PM with 10mg  IV Valium + 3mg  IV ativan, procedure again aborted due to extreme anxiety and inability to tolerate. Pending discussion with rad onc in AM re: utility of ongoing attempts at RT. Pending transfer to NP15 on med onc service.  O/N: increasingly anxious / restless, but able to re-direct and calm down with PRN ativan. Liquid stools, placed rectal tube. Will start banatrol and check CBC in AM, check c dif. This had previously improved by holding bowel regimen.2/16: Acutely increased WOB, HR 130s, SBP 170s --> ENT bedside scope with trach patent, though notable secretions --> suctioned with minimal improvement; placed back on ventilator PSV 10 / +5 with immediate improvement in VS and respiratory distress. Precedex gtt started to assist more frequent suctioning. CXR with LLL consolidation --> resend sputum, and restart Vanco (close trough monitoring with AKI) given known MRSA PNA. Increased diarrhea, cdiff negative, transition to Vital TF. LR 500cc for hemoconcentration of labs, tachycardia. Appreciate radiation oncology; sedated patient with Propofol gtt, bolus x1 with good effect --> able to tolerate 15 minute radiation session; plan to repeat Thursday, Friday, Monday, and Tuesday. 2/17: Psych consulted for depression--> Doxepin added, Klonopin added. Hypernatremia--> D5W and free water flushes added. Ceftaz added for GNR in sputum. Plan for family discussion this PM (see separate note) and radiation this PM. Foley replaced for retention. ON: Tolerated PSV 15/5. Repeat Na 148.2/18: Plan for radiation this afternoon. Rectal tube removed.. Hgb 6.3--> transfused 1un PRBCs. Discussed care with sister and niece at bedside, remain full code and plan to continue radiation course through Tuesday (see family communication note).2/19: PSV weaned 15 --> 12/+5. Start Clonidine and discontinue Precedex gtt. Discontinue Gabapentin and Dilaudid IV, decrease Oxycodone to 2.5 / 5. Na improved 144, discontinue D5W; sugars should improve, maintain current regimen per DM team. GNR per micro lab not pseudomonas, klebsiella, or other enteric organism; would have speciated --> change Ceftaz to PO Cipro, to complete course 2/23 for one week. Cr again increased with Vanco level over 20 --> next dose held, monitor level closely. Hgb remains low despite 1U yesterday, 6.3 --> 7.0; start multivitamins and check with Heme. ON: Low grade fevers stable. Given ativan  0.5mg  in place of klonipin with good anxiolytic effect. Transfuse 1u PRBC (likely half unit) for Hgb 6.7.2/20: Acute respiratory distress this morning after turn, minimal secretions, did not improve with increased pressure or VAC --> bagged x5 mins with improvement, placed on PCV, IP 20 --> CXR pending, start Propofol, add back Ativan and Morphine PRN. Appreciate heme assistance with persistent anemia. Cr continues to rise following elevated vanco trough; hold any further doses, level will likely remain therapeutic to final date of course (2/22) --> unfortunately, patient did receive additional dose of Vanco 2g per pharmacy order this morning; discontinued placeholder, consider Doxycycline if Vanco level subtherapeutic prior to end date.ON: Remains on PCV. Remained on low dose levo 0.02 throughout night. Manual suctioning by RT overnight with thin tan secretions. AM labs notable for Hgb 9.1(6.7) after 1uPRBC transfusion yesterday. Worsening AKI Cr 2.51 (2.44).2/21: Patient alert and breathing comfortably this morning --> wean PEEP back to 5, consider retrial of PSV today pending radiation plans. Free water flushes discontinued, hypernatremia resolved. Worsening AKI from Vanco; since discontinued, monitor UOP. LR 250cc, wean off Levo gtt. Appreciate palliative care assistance after event yesterday, patient requesting further discussion. Lantus changed to 15U QHS, 10U QAM and bolus increased to 28U Q6.ON: Acute respiratory distress at change of shift, lost tidal volumes and desaturated to high 80s requiring bagging, recovered. Pt expressing that he cannot breathe, feels obstructed --> suctioned with 10Fr suction w/ resistance met at 20cm and minimal secretions. ENT at bedside for scope --> thick secretions noted but airway patent.. Noted that Bivona trach was retracted, advanced to proper position by ENT. Per ENT, tracheal tissue proximal to bivona tip very flimsy. Likely tissue collapsing against distal tip with trach retracted in poor position causing obstruction. Patient acutely distressed and agitated during event, propofol gtt restarted will plan to wean off tonight. Start decadron 8mg  q12 per onc recs for peritumoral inflammation. Hyperkalemic in AM to 5.7 --> EKG unchanged, insulin/dextrose/Ca2+ x 1. Transfuse 1u PRBC for hgb 6.9. 2/22: Keep on PCV today and continue propofol gtt. Start Insulin gtt and increase Lantus to 20u BID. No bowel movements --> discontinue banatrol. Plan for radiation today. Discussed transfer to MICU no beds available, will initiate transfer once a bed becomes available. Plan for multidisciplinary family meeting this week. ON: Rested on PCV, low dose prop. WBC count 12 (7) this am2/23: Plan for last palliative radiation session later today. RR decreased to 12. Given rising white count and increased in line suctioning requirement Sputum culture sent and CXR obtained. Will stop Cipro and start ceftriaxone. 1L LR bolus given for AKI, rising BUN as well as likely concentration on CBC. Plan to start weaning off prop after radiation and attempt PSV trials again. Family discussion with all teams Thursday or Friday. CPT added back given increased secretions attempted to add back HTS nebs however hospital is completely out per pharmacy discussion. Respiratory culture again growing GPC and GNR. ON: BG stable on ins gtt. NAE2/24: This AM, less responsive (GCS 10T) and BP down to 60/40s, but also worsening vent synchrony --> increased oxycodone and dilaudid followed by 1000 LR bolus with good effect and pan-cx given worsening leukocytosis. Started vanc / zosyn.  Later, self d/c bivona trach and desat to 60s; SICU attending at bedside and replaced bivona trach without difficulty and placed back on the ventilator.  ENT at bedside to scope and confirmed trach positioning. Will plan for family meeting tomorrow. ON: NAE2/25: GCS 11T, but still withdrawn / lethargic. Will  d/c PRN ativan to minimize sedation and continue to wean propofol as tolerated, though patient has been mentating on higher doses than this. Still low dose levo requiremements. Bivona trach dislodged ~4cm today, obturator placed and re-advanced without issue. Trial PSV 15/5, tolerating with Tv 400s. D/w ENT, will try to work on custom trach for him. Plan for family meeting this afternoon for goals of care discussions. Rested on [facility] settings after ~6 hours of 15/5 (work of breathing + tachypnea 40s). Decreased lantus to 15 BID per endocrine. In afternoon, became more agitated / tachy requiring additional PRN 1mg  ativan + 1mg  versed + dilaudid. See ACP note for family meeting discussion. O/N: NAE. 2/26: Attempt to wean propofol to 35, increased agitation and tachypnea --> increase Prop back to 40 and oxycodone given with improvement. Plan for PSV 15/5 x 4 hours today as tolerated. Increased Lantus to 20u BID. Discontinued Zosyn. Discussed with pharmacy switching Vanco to Clinda given worsening renal function --> recommend continuing Vanco as high risk of C.diff with Clinda, will recheck random vanco level tomorrow @ 0500. 1L NS given for rise in creatinine. O/N: Vanc trough 21.4. 2/27: Plan for PSV 15/5 x 6 hours as tolerated. 1L LR given for high insensible losses. Change Vanco to PO Doxy. Increase Lantus to 30u BID, d/c insulin gtt and start HDISS.  O/N: K 5.7 --> insulin/dextrose/Ca given. 2/28: Remains on Levo. K 5.7--> 5.9--> Insulin/ Dextrose/ Ca x given x2 and Lokelma x1. Hyperglycemic--> Lantus 30 daily, HDISS, Regular Insulin 9un Q6hours. Decreased IP to 18, tolerating with TV 400s-500s. Heme consulted for ongoing thrombocytopenia, low concern for HIT, no plan to treat ITP unless plt<30k or bleeding.  O/N: At shift change insulin Gtt restarted, Significant stool output after lokelma, K improving to 5.4, will trend. AM labs with Plt 35, HSQ held following heme reccs. 3/1: [name] noted to be dislodged, re-advanced and ENT consulted with no acute interventions. CXR showing trach in stable position. Decreased IP to 15. Will trial on PSV 15/5 x6hours BID. Afternoon plt stable at 34; will continue to hold HSQ. K stably elevated at 5.5; no acute intervention given insulin gtt and large rectal tube output. Levo weaned off. O/N: Rested on Pressure control after 6 hours PSV. NAE. Repeat plt stable at 34. 3/2: Weaning prop and transitioning to dex to decrease sedation and aid in anxiety/agitation management. Will trial on PSV 10/5 x6hr BID today, resting on PCV. Psych re-engaged due to withdrawn affect/mood this AM; d/ced doxepin to avoid further delirium/AMS. Acutely tachycardic, tachypneaic, and hypoxic w/ O2 sat in 80's on PSV trial --> prop gtt increased and placed back on PCV with IP 12. Weaned off prop, on dex gtt. ON: Rested on PCV. K 6.1 --> 1g Ca, insulin and dextrose given, EKG obtained.3/3: Potassium 6.0 --> insulin/dextrose, Lokelma 10g x 1 given. Held off on further calcium as serum calcium 10.3 and corrected Calcium is ~11.6. Bicarb 19 --> ordered Na Bicarb tabs 650mg  TID. 500cc NS x 1  Given for goal even to +500. Tube feeds changed to Nepro. Trial of PSV 5/5 --> last ~ then became tachypneic and put back on 10/5. Tolerated 10/5 x 6 hours, rested on PCV. Repeat potassium 5.8 --> 5u insulin/dextrose. Will plan for additional dose of Lokelma at 6pm. Placed on second 110.5 PSV trial ~6pm, plan for 6hrs as tolerated. O/N: Completed PSV trial.  Repeat K - 6 --> insulin/dextrose and lokelma given. 3/4: Patient repositioned with Trach pulled out halfway, replaced without  difficulty. CXR post trach in good position. 1st PSV 10/5 trial for 2.5 hours, stopped due to tachypnea. Plan for 2nd trial for goal 6 hours. 4pm K 5.6. O/N: Tolerated second PSV trial, placed on resting settings at 21:40.  K 5.8, lokelma and calcium gluconate given. 3/5: Tolerated PSV trial well. Plan to continue with PSV 10/5 q6 BID today. K 5.8 on PM labs --> additional lokelma given, held on Ca2+ given serum level at ULN. Reviewed med list with pharmacy, no clear pharmacologic cause of hyperkalemia. Will plan to check again at [place]. Q1 suctioning of thin tan secretions. O/N: Repeat K 5.7, lokelma given.3/6: PSV 10/5 as tolerated today, rest on PCV. Discussed hyperkalemia with pharmacy, OK for further lokelma doses but since patient not having BMs, likely not very effective. Will give dose of senna, consider standing bowel regimen tomorrow. Episode of acute agitation/tachycardia to 140s requiring increase in precedex gtt with good effect, minimal secretions. PM K+ 5.5(5.8) --> lokelma 10g. Review of Allergies/Meds/Hx: I have reviewed the patient's: allergies, past medical history, past surgical history, family history, social history, prior to admission medicationsObjective: Vitals:I have reviewed the patient's current vital signs as documented in the EMRTemp:  [99.14 ?F (37.3 ?C)-100.22 ?F (37.9 ?C)] 99.68 ?F (37.6 ?C)Pulse:  [82-125] 84Resp:  [9-27] 22BP: (99-151)/(51-89) 99/51SpO2:  [98 %-100 %] 100 %SpO2: 100 %Device (Oxygen Therapy): mechanical ventilatorI/O's:I have reviewed the patient's current I&O's as documented in the EMR.Gross Totals (Last 24 hours) at 11/08/2020 0153Last data filed at 11/08/2020 0100Intake 2217.48 ml Output 3825 ml Net -1607.52 ml Physical ExamConstitutional: Middle aged trached man in NADHEENT: NCAT. PERRL, EOMI. External ears normal. Bivona trach midline with xeroform distal skinCardiovascular: RRR, no MRG appreciated. Distal pulses are 2+ bilaterally.Pulmonary: Trached on vent. Tachypneac at times. Suctioned for small amount of thin secretions.Abdominal: Soft, obese, non distended, non tender to palpation, +BS. PEG Tube site intactGenitourinary: Foley catheter in place, draining clear yellow urine.Musculoskeletal: BUE strength 4/5, BLE 2/5Neurological: Intermittent opens eyes, nods head, FSC. Agitated at timesSkin: Warm and dry.Psychiatric: Unable to assessLabs: Refer to EMR. I have reviewed the patient's labs within the last 24 hours; see assessment and plan below for significant abnormals.Microbiology:Refer to EMR. I have reviewed all new results within the last 24 hours; see assessment and plan below.Diagnostics:Refer to EMR. I have reviewed all new results within the last 24 hours; see assessment and plan below.Chest ZO:XWRUE to EMR. I have reviewed all new results within the last 24 hours; see assessment and plan below.Abdominal AV:WUJWJ to EMR. I have reviewed all new results within the last 24 hours; see assessment and plan below.ECG/Tele Events: I have reviewed the patient's ECG and telemetry as resulted in the EMR.Assessment/Plan Neurologic: Analgesia/sedation - Tylenol 650 Q6 PRN - Oxycodone 10 / 15 Q4 PRN - Dilaudid 1 PRN breakthrough- Precedex gtt Anxiety/depression- s/p clonidine taper (stopped early 2/2 hypotension)- Clonazepam 0.5mg  BID, consider d/c if mental status continues to be depressed- Ativan 0.5mg  IV Q6 PRN acute agitation- Appreciate palliative care and psych consults?ENT: L laryngeal SCC, invading tracheostomy: s/p trach exchange 10/10/20 and biopsy 10/12/20- Bivona trach to bypass obstruction 	- ENT planning for custom bivona trach- Unfortunately, there are no surgical / curative options- Palliative radiation (2/16, 2/17, 2/18, 2/21, 2/22, 2/23)	- Required PCV with full propofol sedation to tolerate?Cardiac: Septic shock- s/p Norepinephrine gtt(2/24- 3/1) to maintain MAP>65Hx of HTN / HLD- Hold home lisinopril, lasix, crestor given AKI?Respiratory:  Respiratory failure (circumferential tumor invasion of stoma, mucous plugging / trach occlusion)- PSV 10/5 as tolerated,  rest on PCV- PCV IP 12, PEEP +5- s/p Decadron 8mg  BID for tumor inflammation (2/22 -2/24) per med oncology- Albuterol Q6- Could consider mucomyst nebsGI: Diarrhea, resolved - PEG in place with TF: Nepro@ 65 ml/hour- Pepcid while vented?Renal: AKI on CKD (baseline Cr 1.5-1.6), 2/2 supratherapeutic vancomycin level- Cr continues to rise, continue to trend daily- Foley replaced 2/17 for urinary retentionHyperkalemia:- Trend lytes- Medical management with [facility] and Calcium if needed- Sodium Bicarbonate tabs 1300 TID, watch for alkalosis - Lokelma (can give up to 10mg  TID) --> increase bowel regimen as appropriate to facilitate clearance- Reviewed medication list with pharmacy, no clear pharmacologic culprit- Consider nephrology consult for input on hyperkalemia sourceHypercalcemia- Corrected Calcium >11- Avoid further Calcium repletion at this time Infectious Disease:  MRSA pneumonia- 2/24 sputum: 4+ MRSA, 2+ MRSA- 2/23 sputum: MRSA- S/p Vanco  (2/24- 2/27), de-escalate to Doxy (2/27 - 3/5) total 10d course - S/p Zosyn (2/24- 2/26)Hx MRSA PNA; 4+ GNR PNA- s/p Vanco (2/5 - 2/14, 2/16 - 2/22)- s/p Ceftaz (2/17- 2/19)- s/p Cipro (2/19 - 2/23)?Hematology:Critical illness anemia- Sanguinous secretions, expected given tumor burden and coagulopathy, may worsen with radiation and baseline hematologic disease- Multivitamins and cofactors- Maintain Hgb >7?Hx of chronic ITP, Waldenstrom macroglobulinemia- Transfuse platelets if <30k, bleeding, or need for invasive procedure per heme- No need to treat ITP unless plts<30k- HSQ, consider holding Hep SC if plts<50k - Appreciate hematology recommendations?R IJ thrombus - High risk heparin gtt when plt consistently >50 and no more bloody secretions from tracheostomy- Maintain SCDs at all times?Endocrine: Hx T2DM- Insulin gtt restarted 2/28- Lantus 15 units BID- HOLD home Metformin, Lantus 64u, Apidra 20u TID w meals- Appreciate diabetes team recs, signed off for now. Reconsult when appropriateMusculoskeletal: Generalized deconditioning- OOB daily, PT/OT evals- Turn and reposition Q2 hours, PUP dressing?Device Review:- R IJ Port (08/12/20) per guidelines, nonocclusive DVT does not require removal of line- PEG (09/14/20)- Bivona trach (10/10/20)?Code Status / Dispo:- Transition to DNR (see ACP note 2/25)- Per ENT, patient to transfer to medical oncology after SICU- Appreciate palliative care assistance with GOC discussionsNotifications : For questions please call SICU Covering Provider in Epic EMR (primary) SICU [facility] Dynamic Roles (secondary)[facility] 7-1 APP			[phone] 6-1 Frontside		[phone] 6-1 Backside		[phone] Verdi-2 APP			[phone]:Amaar Oshita PA-C3/02/2021

## 2020-11-08 NOTE — Progress Notes
Otolaryngology Progress NoteDate: 11/08/2020 Interim History- pending custom trachVitalsTemp:  [37.5 ?C-37.9 ?C] 37.8 ?CPulse:  [80-125] 85Resp:  [15-27] 23BP: (93-151)/(49-87) 93/49SpO2:  [98 %-100 %] 100 %Device (Oxygen Therapy): mechanical ventilator;CPAP I/O last 3 completed shifts:In: 2257.3 [I.V.:697.3; NG/GT:1560]Out: 3460 [Urine:3410; Stool:50]Physical ExamGen: alert, vented PCEyes: open, normal external appearanceFace: grossly symmetricNose: clear anteriorlyOP/OC: hemostaticNeck: Bivona 8 cuffed secured w/soft trach ties, necrotic but hemostatic mass surrounding the stoma, remaining neck flat, firm tumor Pulm: no increased WOB on TMDiagnostic DataReviewed, per chartAssessment & Plan54 M with PMH of HTN, HLD, DM, tobacco and EtOH use, Waldenstrom macroglobulinemia, 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 Admitted to SICU for ventilatory support. No acute ENT intervention planned; no curative or meaningful surgical options at this time. Course complicated by multiple episodes of Bivona trach occlusion/obstruction.  - custom Bivona trach ordered- no ENT intervention planned at this time; case discussed at tumor board 2/14 and is not a surgical candidate - disease management/goals-of-care best pursued via non-surgical means; eg, Medical Oncology, Radiation Oncology, Palliative Care- transfer to Medical Oncology floor when eligible - continue routine trach care with careful suctioning- ensure trach well secured with Karn Cassis, MD (PGY-1)

## 2020-11-08 NOTE — Plan of Care
Plan of Care Overview/ Patient Status    Remains sedated on precedex, opens eyes to voice, following commands intermittently and able to nod head to yes/no questions, moving all extremities BUE 3/5 and BLE 2/5, remains in bilateral wrist restraints to prevent patient from pulling at tubes/lines - see restraint flowsheets, pain managed with prn oxycodone & dilaudid with good effect, tmax 99, remains in sinus rhythm with rates 80s-100s, systolic BP 90s-120s, right chest port needle changed, trached - Bivona #8, site care done - peristomal area macerated/xeroform & gauze replaced, completed 6-hour trial of CPAP 10/5 without issue, now resting on pressure A/C settings, suction requirements q1-q2, sputum thin/tan, abdomen distended, Full strength Nepro infusing through PEG at 15mL/hr (goal) with no residual, rectal tube in place with 0 output thus far, insulin gtt titrated per protocol -currently infusing at 4units/her, foley draining adequate amounts yellow urine with sediment 100-222mL/hr, excoriation to buttocks open to air/secura cream applied, turned/repositioned q2hrs, contact precautions maintained, see flowsheets for additional informationCarrie Mckinzi Eriksen, RN 5:43 AM

## 2020-11-08 NOTE — Plan of Care
Spontaneous Breathing Trial performed for 6 hours on the settings :CPAP: 5 cmH2OPressure Support: 10 cmH2OFiO2: 40%Patient tolerated trial well.Parameters:  RR:18Vt: 470VE: 8.17F/Vt:35Vitals:HR-Pulse: (!) 105 SpO2-SpO2: 99 %Trial discontinued due to time limit. Placed back on resting Pressure AC settings for overnight.

## 2020-11-08 NOTE — Progress Notes
Surgical Intensive Care Unit  AttendingI have personally performed a face to face diagnostic evaluation on this patient, reviewed the chart, available data and care plan and have personally formulated the plan outlined below. ?I have seen this patient in addition to the Surgical ICU advanced practitioner given patient's complexity and associated need for critical care.??54 y.o.?male?admitted to the surgical intensive care unit on 10/14/2020 for?on?10/09/2020?for respiratory failure. 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 ?The patient's hospital course was significant for a knee tracheostomy exchange 10/10/20, complicated by episodes of hypoxia requiring suctioning of mucus plugs and transferred to the surgical intensive care unit on 10/14/2020. ?The patient received palliative radiation treatments, completed on 10/28/20.??His hospital course has been notable for altered mental status and decreased responsiveness, continued vasopressor requirement, pressure support trials, ongoing antibiotic treatment for possible aspiration pneumonia.Hospital Day 30 Past Medical History: Diagnosis Date ? Diabetes mellitus (HC Code) (HC CODE)  ? Hypercholesteremia  ? Hypertension  No past surgical history on file.Scheduled Meds:Current Facility-Administered Medications Medication Dose Route Frequency Provider Last Rate Last Admin ? albuterol neb sol 2.5 mg/3 mL (0.083%) (PROVENTIL,VENTOLIN)  2.5 mg Nebulization Q6H [place], [place], [place]   2.5 mg at 11/08/20 1660 ? ascorbic acid (vitamin C) (VITAMIN C) tablet 250 mg 250 mg Per G Tube Daily Jennye Boroughs, PA   250 mg at 11/08/20 6301 ? chlorhexidine gluconate (PERIDEX) 0.12 % solution 15 mL  15 mL Mouth/Throat Q12H Leonia Reeves, PA   15 mL at 11/08/20 6010 ? clonazePAM (KlonoPIN) tablet 0.5 mg  0.5 mg Per J Tube BID Mat Carne, APRN   0.5 mg at 11/08/20 9323 ? famotidine (PEPCID) tablet 20 mg  20 mg Per J Tube Daily [place], Lauren, PA   20 mg at 11/08/20 5573 ? folic acid (FOLVITE) tablet 1 mg  1 mg Per G Tube Daily [place], [place], [place]   1 mg at 11/08/20 2202 ? heparin (porcine) injection 7,500 Units  7,500 Units Subcutaneous Q8H [place], [place], [place]   7,500 Units at 11/08/20 5427 ? insulin glargine (Semglee,Lantus) injection 15 Units  15 Units Subcutaneous Q12H Sharyne Richters, PA   15 Units at 11/08/20 0623 ? miconazole nitrate (SECURA EXTRA THICK ANTIFUNGAL) 2 % cream   Topical (Top) BID Lisette Abu, [place]   Given at 11/08/20 7628 ? midazolam (PF) (VERSED) 1 mg/mL injection          ? Prosource no carb 15 gram protein and 60 kcal  1 packet Per G Tube TID Bettey Costa, PA   1 packet at 11/08/20 3151 ? sodium bicarbonate tablet 1,300 mg  1,300 mg Per G Tube TID Gearldine Shown, PA   1,300 mg at 11/08/20 7616 ? thiamine (VITAMIN B1) tablet 50 mg  50 mg Per G Tube Daily Jennye Boroughs, PA   50 mg at 11/08/20 0737 Continuous Infusions:? dexmedetomidine 0.7 mcg/kg/hr (11/08/20 0900) ? insulin regular human 100 units in sodium chloride 0.9 % 100 mL 3 mL/hr at 11/08/20 0900 PRN Meds: acetaminophen, carboxymethylcellulose, Initiate Insulin Infusion Protocol for Adult Patients (Nurse-Driven) **AND** [COMPLETED] insulin regular human **AND** insulin regular human 100 units in sodium chloride 0.9 % 100 mL **AND** POC Glucose (Fingerstick) **AND** Nursing communication **AND** dextrose injection **AND** dextrose  injection, glucagon, HYDROmorphone, LORazepam, oxyCODONE, oxyCODONE, zinc oxideVITALS:Temp (24hrs), Avg:99.8 ?F (37.7 ?C), Min:99.5 ?F (37.5 ?C), Max:100.22 ?F (37.9 ?C)Vitals:  11/08/20 0700 11/08/20 0735 11/08/20 0800 11/08/20 0900 BP: (!) 118/57  131/65 (!) 149/68 Pulse: 83 (!) 116 (!) 95 (!) 107 Resp: 19 (!) 27 15 20  Temp: 99.86 ?F (37.7 ?C) 99.86 ?F (37.7 ?C) (!) 100.04 ?F (37.8 ?C) (!) 100.04 ?F (37.8 ?C) TempSrc: Bladder  Bladder  SpO2: 100%  100% 100% Weight:     Height:     Intake/Output Summary (Last 24 hours) at 11/08/2020 1010Last data filed at 11/08/2020 0900Gross per 24 hour Intake 2245.5 ml Output 3225 ml Net -979.5 ml Exam unchanged as of 11/08/2020 with exceptions noted belowPhysical ExamLabs Last 24 hours:Most Recent Result Component Value Date/Time  Sodium 144 11/08/2020 12:01 AM  Potassium 5.7 (H) 11/08/2020 12:01 AM  Chloride 113 (H) 11/08/2020 12:01 AM  CO2 21 11/08/2020 12:01 AM  BUN 89 (H) 11/08/2020 12:01 AM  Creatinine 2.05 (H) 11/08/2020 12:01 AM  WBC 11.4 (H) 11/08/2020 12:01 AM  Hemoglobin 7.5 (L) 11/08/2020 12:01 AM  Hematocrit 24.90 (L) 11/08/2020 12:01 AM  Platelets 98 (L) 11/08/2020 12:01 AM Imaging Last 24 hours:No results found.Overnight Events:--?Ongoing hyperkalemiaInfectious Disease/Sepsis:?MRSA pneumonia-- Continue to monitor WBC count and fever curve-- Continue?doxycycline (end date 11/07/20)-- Prior antibiotics:??Vancomycin (2/5 - 2/14, 2/16 - 2/22);?ceftazidime?(2/17 - 2/19);?ciprofloxacin?(2/19 - 2/23)HEENT:Squamous cell laryngeal cancer invading tracheostomy, status post tracheostomy exchange?10/10/20?and biopsy on 10/12/20-- Continue?Bivona?tracheostomy to bypass obstruction, maintain additional tracheostomy at the bedside-- Awaiting custom trach as per ENT recommendations-- Completed course of palliative radiation as per oncology recommendations?(2/16 - 2/23)-- Continue?to follow-up Oncology recommendations, we appreciate the consult-- Continue?to follow-up?with a primary team (ENT) recommendations; no surgical management at this time as per their recommendationsCardiac:?Septic shock, improved-- Continue to monitor hemodynamic status--?Weaned of?norepinephrine?3/1 at 2PM-- Continue?goal MAP > 65 mmHgHistory of hypertension, hyperlipidemia-- Continue?to hold home lisinopril, Lasix, Crestor until hemodynamically stable and tolerating PORespiratory:?Respiratory failure with circumferential squamous cell laryngeal cancer invading stoma, with intermittent mucus plugging/tracheal occlusion-- Wean off?mechanical ventilation?as tolerated-- Encourage IS and pulmonary toilet when able-- Continue SBT-- Continue?albuterol nebs q6 hours while awake-- Continue?chest physiotherapy q6 hrs?hours while awakeRenal, Fluids/Electrolytes:?AKI on CKD (baseline creatinine 1.5 to 1.6), with recent supratherapeutic vancomycin level, now?stable-- Maintain Foley catheter for the strict monitoring of I/O in this critically ill patient;?placed for urinary retention on?10/22/20, will perform voiding trial when more stableHyperkalemia--?Continue to tre at and monitor potassium levelsHematology:?????Anemia, stable-- Continue to monitor H/HHistory of chronic ITP, [place] storms macroglobulinemia; thrombocytopenia-- Consult Hematology today given the downtrending platelets-- Possible steroids vs IVIG with continued thrombocytopenia as per Hematology recommendations-- Continue?to follow-up Hematology recommendations; as per their evaluation and recent response to respiratory steroids, thrombocytopenia possibly secondary to ITP, hold HITT panel held as per Hematology recommendationsRight IJ thrombus-- Continue?to hold off on systemic anticoagulation given thrombocytopenia, waiting Hematology recommendations--?Continue SQH, hold systemic anticoagulation given thrombocytopenia; consider restarting high-risk protocol tomorrow with consistent improvement in plateletsEndocrine:??Diabetes, hyperglycemia-- Insulin protocol for hyperglycemia-- Continue to monitor FSs-- Continue to hold home metformin, Lantus,?Apidra-- Continue?to follow-up Endocrinology/Diabetes Team recommendations, we appreciate the consultGI/Nutrition:???Diarrhea, improved-- Diet as per Primary Team?via PEG-- Continue bowel regimenNeurologic:??Anxiety, depression; delirium without focal deficit-- Continue?clonazepam 0.5mg  via PEG BID-- Continue?lorazepam?0.5mg  IV q6hrs PRN?severe anxiety-- Continue?to follow-up?Psychiatry recommendations, we appreciate the consult-- Continue?to follow-up?Palliative?Care recommendations, we appreciate the consultPain-- Continue to monitor neurologic status-- Continue acetaminophen 650mg  via PEG q4 hours PRN pain, fever-- Continue?oxycodone 10mg  and 88m via PEG q4 hrs PRN moderate to severe pain, respectively-- Continue?hydromorphone 1mg  IV q3hrs PRN?breakthrough painSedation while  intubated-- Continue?dexmedetomidine; transition to clonidine todayPain, Sedation Management:Pain is being adequately controlled with the current regimenSedation is being adequately addressed by this patient's current regimen Musculoskeletal:??-- PT/OT when ableSkin/wound: Frequent repositioning to mitigate against pressure ulcerationGoals of Care/Advance Directives:Code status:???DNRLines:--?PIVs-- Right port (08/12/20)-- PEG (09/14/20)-- Trach--?Foley catheter?(10/22/20)Prophylaxis: ?-- VAP: HOB elevation;?chlorhexidine-- GI: HOB elevation; famotidine-- VTE: SCD; SQH?Plan discussed with:??Primary Service, Consult Services, Critical Care Nursing and Resident, Respiratory Therapy, Pharmacy, and the Patient. ?The patient's family was not present for rounds this AM.?This patient is critically ill as indicated by the following Acute Respiratory FailureThe patient has required 37 minutes of my undivided attention during the past 24 hours for one of the previous mentioned life threatening diagnoses. The critical care interventions have included: Management of Acute Respiratory Failure with initiation of spontaneous breathing trials Lonia Mad, MD SICU Attending3/02/2021

## 2020-11-09 LAB — CBC WITH AUTO DIFFERENTIAL
BKR WAM ABSOLUTE IMMATURE GRANULOCYTES.: 0.13 x 1000/ÂµL (ref 0.00–0.30)
BKR WAM ABSOLUTE LYMPHOCYTE COUNT.: 0.23 x 1000/ÂµL — ABNORMAL LOW (ref 0.60–3.70)
BKR WAM ABSOLUTE NRBC (2 DEC): 0 x 1000/ÂµL (ref 0.00–1.00)
BKR WAM ANALYZER ANC: 9.57 x 1000/ÂµL — ABNORMAL HIGH (ref 2.00–7.60)
BKR WAM BASOPHIL ABSOLUTE COUNT.: 0.04 x 1000/ÂµL (ref 0.00–1.00)
BKR WAM BASOPHILS: 0.4 % (ref 0.0–1.4)
BKR WAM EOSINOPHIL ABSOLUTE COUNT.: 0.35 x 1000/ÂµL (ref 0.00–1.00)
BKR WAM EOSINOPHILS: 3.1 % (ref 0.0–5.0)
BKR WAM HEMATOCRIT (2 DEC): 25.4 % — ABNORMAL LOW (ref 38.50–50.00)
BKR WAM HEMOGLOBIN: 7.7 g/dL — ABNORMAL LOW (ref 13.2–17.1)
BKR WAM IMMATURE GRANULOCYTES: 1.2 % — ABNORMAL HIGH (ref 0.0–1.0)
BKR WAM LYMPHOCYTES: 2 % — ABNORMAL LOW (ref 17.0–50.0)
BKR WAM MCH (PG): 27.2 pg (ref 27.0–33.0)
BKR WAM MCHC: 30.3 g/dL — ABNORMAL LOW (ref 31.0–36.0)
BKR WAM MCV: 89.8 fL (ref 80.0–100.0)
BKR WAM MONOCYTE ABSOLUTE COUNT.: 0.93 x 1000/ÂµL (ref 0.00–1.00)
BKR WAM MONOCYTES: 8.3 % (ref 4.0–12.0)
BKR WAM MPV: 12.2 fL — ABNORMAL HIGH (ref 8.0–12.0)
BKR WAM NEUTROPHILS: 85 % — ABNORMAL HIGH (ref 39.0–72.0)
BKR WAM NUCLEATED RED BLOOD CELLS: 0 % (ref 0.0–1.0)
BKR WAM PLATELETS: 109 x1000/ÂµL — ABNORMAL LOW (ref 150–420)
BKR WAM RDW-CV: 17.4 % — ABNORMAL HIGH (ref 11.0–15.0)
BKR WAM RED BLOOD CELL COUNT.: 2.83 M/ÂµL — ABNORMAL LOW (ref 4.00–6.00)
BKR WAM WHITE BLOOD CELL COUNT: 11.3 x1000/ÂµL — ABNORMAL HIGH (ref 4.0–11.0)

## 2020-11-09 LAB — BASIC METABOLIC PANEL
BKR ANION GAP: 10 (ref 7–17)
BKR BLOOD UREA NITROGEN: 91 mg/dL — ABNORMAL HIGH (ref 6–20)
BKR BUN / CREAT RATIO: 45 — ABNORMAL HIGH (ref 8.0–23.0)
BKR CALCIUM: 9.8 mg/dL (ref 8.8–10.2)
BKR CHLORIDE: 113 mmol/L — ABNORMAL HIGH (ref 98–107)
BKR CO2: 23 mmol/L (ref 20–30)
BKR CREATININE: 2.02 mg/dL — ABNORMAL HIGH (ref 0.40–1.30)
BKR EGFR (AFR AMER): 42 mL/min/{1.73_m2} (ref >60)
BKR EGFR (NON AFRICAN AMERICAN): 34 mL/min/{1.73_m2} (ref >60)
BKR GLUCOSE: 174 mg/dL — ABNORMAL HIGH (ref 70–100)
BKR POTASSIUM: 5 mmol/L (ref 3.3–5.3)
BKR SODIUM: 146 mmol/L — ABNORMAL HIGH (ref 136–144)

## 2020-11-09 LAB — PHOSPHORUS     (BH GH L LMW YH): BKR PHOSPHORUS: 4.6 mg/dL — ABNORMAL HIGH (ref 2.2–4.5)

## 2020-11-09 LAB — MAGNESIUM: BKR MAGNESIUM: 2.3 mg/dL (ref 1.7–2.4)

## 2020-11-09 MED ORDER — CLONIDINE HCL 0.2 MG TABLET
0.2 mg | GASTROSTOMY | Status: CP
Start: 2020-11-09 — End: ?
  Administered 2020-11-16 – 2020-11-17 (×2): 0.2 mg via GASTROSTOMY

## 2020-11-09 MED ORDER — SENNOSIDES 8.6 MG TABLET
8.6 mg | Freq: Two times a day (BID) | GASTROSTOMY | Status: DC
Start: 2020-11-09 — End: 2020-11-14
  Administered 2020-11-10 – 2020-11-14 (×10): 8.6 mg via GASTROSTOMY

## 2020-11-09 MED ORDER — CLONIDINE HCL 0.1 MG TABLET
0.1 mg | Freq: Four times a day (QID) | GASTROSTOMY | Status: CP
Start: 2020-11-09 — End: ?
  Administered 2020-11-09 – 2020-11-11 (×7): 0.1 mg via GASTROSTOMY

## 2020-11-09 MED ORDER — CLONIDINE HCL 0.2 MG TABLET
0.2 mg | Freq: Two times a day (BID) | GASTROSTOMY | Status: CP
Start: 2020-11-09 — End: ?
  Administered 2020-11-13 – 2020-11-15 (×4): 0.2 mg via GASTROSTOMY

## 2020-11-09 MED ORDER — POLYETHYLENE GLYCOL 3350 17 GRAM ORAL POWDER PACKET
17 gram | Freq: Every day | GASTROSTOMY | Status: DC
Start: 2020-11-09 — End: 2020-11-12
  Administered 2020-11-10 – 2020-11-12 (×3): 17 gram via GASTROSTOMY

## 2020-11-09 MED ORDER — CLONIDINE HCL 0.1 MG TABLET
0.1 mg | Freq: Three times a day (TID) | GASTROSTOMY | Status: CP
Start: 2020-11-09 — End: ?
  Administered 2020-11-11 – 2020-11-13 (×6): 0.1 mg via GASTROSTOMY

## 2020-11-09 MED ORDER — HEPARIN 25,000 UNIT/250 0.45 % NS (HIGH RISK OF BLEEDING-ALL SITES)
25000 unit/250 mL | INTRAVENOUS | Status: DC
Start: 2020-11-09 — End: 2020-11-17
  Administered 2020-11-09 – 2020-11-17 (×19): via INTRAVENOUS

## 2020-11-09 MED ORDER — LORAZEPAM 2 MG/ML INJECTION SOLUTION
2 mg/mL | INTRAVENOUS | Status: DC | PRN
Start: 2020-11-09 — End: 2020-11-10
  Administered 2020-11-09 – 2020-11-10 (×4): 2 mL via INTRAVENOUS

## 2020-11-09 MED ORDER — SODIUM BICARBONATE 650 MG TABLET
650 mg | Freq: Three times a day (TID) | GASTROSTOMY | Status: DC
Start: 2020-11-09 — End: 2020-11-10
  Administered 2020-11-09 – 2020-11-10 (×3): 650 mg via GASTROSTOMY

## 2020-11-09 NOTE — Plan of Care
Plan of Care Overview/ Patient Status    Patient is currently at SICU on attempts to be weaned from sedation and ventilation. Oncology will sign off at this time since we do not have active recommendations. If patient improves and is eligible for transfer to medical oncology, please contact us back. If he worsens and new GOC discussion meetings that require our participation are needed, please contact us as well.Discussed with Dr. Valla Leaver.

## 2020-11-09 NOTE — Plan of Care
Plan of Care Overview/ Patient Status    Remains sedated on precedex, opens eyes to voice, following commands intermittently and able to nod head to yes/no questions, moving all extremities BUE 3/5 and BLE 2/5, prn ativan givenx1 for increased restlessness with good effect, remains in bilateral wrist restraints to prevent patient from pulling at tubes/lines - see restraint flowsheets, pain managed with prn oxycodone & dilaudid with good effect, tmax 99, remains in sinus rhythm with rates 70s-100s, systolic BP 90s-140s, trached - Bivona #8, site care done - peristomal area macerated/xeroform & gauze replaced, resting on pressure A/C settings overnight, suction requirements q1-q2, sputum thin/tan, abdomen distended, Full strength Nepro infusing through PEG at 54mL/hr (goal) with no residual, rectal tube in place with 0 output thus far, lactulose givenx1 with no effect, insulin gtt titrated per protocol -currently infusing at 3.5units/hr, foley draining adequate amounts yellow urine with sediment 100-238mL/hr, excoriation to buttocks open to air/secura cream applied, turned/repositioned q2hrs, contact precautions maintained, see flowsheets for additional informationCarrie Remedios Mckone, RN 5:17 AM

## 2020-11-09 NOTE — Plan of Care
Plan of Care Overview/ Patient Status    Mental status labile, ranging from RASS -1 to acutely agitated/anxious.  When awake, pt able to follow simple commands and answer questions but is very anxious and difficult to redirect.  Ativan dose and frequency increased with good effect, Precedex able to be weaned x 2.  Other scheduled and PRN meds also given, see MAR for details.  Bilateral soft wrist restraints in place to protect patient's airway.  HR 70s-140s depending on level of agitation, BP WNL.  #8 Bivona trach, trach care performed.  Suctioning required approximately q2h for large amount thin tan secretions.  CPAP settings on vent, 10/5, tolerating well.  Attempted 8/5 but pt did not tolerate more than five minutes due to increased WOB, tachypnea and tachycardia, as well as agitation.  Recovered on resting P-AC settings.  TF at goal, rectal tube removed.  +Flatus, no BM this shift.  UOP adequate, skin as documented (excoriation appears improved from last week).  Insulin gtt per protocol.  Heparin gtt initiated per order, PTT due 7:30 pm.  No family at bedside this shift.

## 2020-11-09 NOTE — Plan of Care
Spontaneous Breathing Trial performed for about 15.5 hours on the settings :CPAP: 5 cmH2OPressure Support: 10 cmH2OFiO2: 40%Patient tolerated trial well. Intermittently tachypneic after coughing, resolves on own or with suctioning. Suctioned for small to moderate amount of tan, thin secretions throughout trial. Parameters:  RR: 18Vt: Spontaneous Tidal Volume (mL): 460VE: 8.72F/Vt: 39Vitals:HR-Pulse: 84 SpO2-SpO2: 97 % EtCO2: 35Patient to rest overnight on ordered Pressure AC settings per discussions with covering PA. Placed on Pressure AC at this time.

## 2020-11-09 NOTE — Progress Notes
[facility]	SICU Progress NoteAttending Provider: Alric Quan, MDPost-Operative Day/Post Injury Day Procedure(s):2/5: [name] exchange (ENT) Hospital LOS: 31 days Interim History:  HPI: 55 y.o. male admitted to the SICU on 2/9 for ventilator management following an RRT on the oncology floor for desaturation and increased work of breathing while on trach mask and ultimately placed back on vent. ?Patient was initially a transfer from [facility] on 2/4 after trach and PEG when he had a partial obstruction of trach site from a new necrotic mass. He underwent trach exchange 2/5. Was on trach mask on oncology floor however had 2 episodes on floor requiring RRT for lavage and likely mucous plugging. ?Course Complicated by: - Incidental R IJ thrombus - Multiple episodes of presumed mucous plugging requiring RRT's ?PMH: Chronic ITP, Waldenstrom macroglobulinemia, HTN. HLD, DM, tobacco and alcohol use, Laryngeal SCC (weekly Cisplatin and RT)?PSH: None known ?Home meds: Lipitor, Pepcid, Lisinopril, Metformin, Oxycodone, miralax, Insulin U100 and Lantus SICU course: (see progress note from 3/6 for previous days events)3/6: PSV 10/5 as tolerated today, rest on PCV. Discussed hyperkalemia with pharmacy, OK for further lokelma doses but since patient not having BMs, likely not very effective. Will give dose of senna, consider standing bowel regimen tomorrow. Episode of acute agitation/tachycardia to 140s requiring increase in precedex gtt with good effect, minimal secretions. PM K+ 5.5(5.8) --> lokelma 10g. O/N: Lactulose x 1, no BM. K down to 5.0. Rested on PCV.3/7: Attempted PSV 8/5, immediately became tachypneic to mid 40's, HR to 140's; Rested on PCV x 1 hour then returned to PSV 10/5, will continue as tolerated. Clonidine taper started, PRN Ativan increased to 1mg  Q4h, attempting to wean Precedex. Heparin gtt started for known IJ thrombus. Review of Allergies/Meds/Hx: I have reviewed the patient's: allergies, past medical history, past surgical history, family history, social history, prior to admission medicationsObjective: Vitals:I have reviewed the patient's current vital signs as documented in the EMRTemp:  [98.96 ?F (37.2 ?C)-100.4 ?F (38 ?C)] 98.96 ?F (37.2 ?C)Pulse:  [76-125] 76Resp:  [5-31] 16BP: (91-160)/(46-95) 97/68SpO2:  [94 %-100 %] 97 %SpO2: 97 %Device (Oxygen Therapy): mechanical ventilatorI/O's:I have reviewed the patient's current I&O's as documented in the EMR.Gross Totals (Last 24 hours) at 11/09/2020 0423Last data filed at 11/09/2020 0400Intake 2331.56 ml Output 2975 ml Net -643.44 ml Physical ExamConstitutional: Middle aged trached man in NADHEENT: NCAT. PERRL, EOMI. External ears normal. Bivona trach midline with xeroform distal skinCardiovascular: RRR, no MRG appreciated. Distal pulses are 2+ bilaterally.Pulmonary: Trached on vent. Tachypneac at times. Suctioned for small amount of thin secretions.Abdominal: Soft, obese, non distended, non tender to palpation, +BS. PEG Tube site intactGenitourinary: Foley catheter in place, draining clear yellow urine.Musculoskeletal: BUE strength 4/5, BLE 2/5Neurological: Intermittent opens eyes, nods head, FSC. Agitated at timesSkin: Warm and dry.Psychiatric: Unable to assessLabs: Refer to EMR. I have reviewed the patient's labs within the last 24 hours; see assessment and plan below for significant abnormals.Microbiology:Refer to EMR. I have reviewed all new results within the last 24 hours; see assessment and plan below.Diagnostics:Refer to EMR. I have reviewed all new results within the last 24 hours; see assessment and plan below.Chest IH:KVQQV to EMR. I have reviewed all new results within the last 24 hours; see assessment and plan below.Abdominal ZD:GLOVF to EMR. I have reviewed all new results within the last 24 hours; see assessment and plan below.ECG/Tele Events: I have reviewed the patient's ECG and telemetry as resulted in the EMR.Assessment/Plan Neurologic: Analgesia- Tylenol 650 Q6 PRN - Oxycodone 10 / 15 Q4 PRN -  Dilaudid 1 PRN breakthroughAnxiety / Depression- Precedex gtt, wean as able- Clonidine taper to assist with precedex wean- Clonazepam 0.5mg  BID- Ativan 1mg  IV Q4 PRN- Appreciate palliative care and psych consults?ENT: L laryngeal SCC invading tracheostomy; s/p trach exchange 10/10/20 and biopsy 10/12/20- Bivona trach to bypass obstruction - ENT planning for custom bivona trach- Unfortunately, there are no surgical / curative options- S/p palliative radiation (2/16, 2/17, 2/18, 2/21, 2/22, 2/23)- Appreciate med onc input, no current recommendations, signed off; reconsult when ready for transfer to floor (will go under med onc service) or if needed to assist with GOC discussions?Cardiac: Hx of HTN / HLD- Hold home lisinopril, lasix, crestor; resume when appropriate ?Respiratory:  Respiratory failure 2/2 circumferential tumor invasion of stoma, mucous plugging / trach occlusion- PSV 10/5 as tolerated, rest on PCV as needed- PCV IP 12, PEEP +5- S/p Decadron 8mg  BID for tumor inflammation (2/22 - 2/24) per med oncology- Albuterol Q6hGI: Protein-calorie malnutrition- TF at goal via PEG (Nepro 2/2 hyperkalemia)- Bowel regimenPUD prophylaxis- Pepcid while vented?Renal: AKI on CKD (baseline Cr 1.5-1.6), 2/2 supratherapeutic vancomycin level- Creat stable ~2, continue to trend- Foley replaced 2/17 for urinary retentionHyperkalemia- Medical management as needed - Sodium Bicarbonate tabs 650 TID (halved 3/7, wean as able)- Nepro TF as aboveInfectious Disease:  MRSA pneumonia, resolved- S/p 10 day course of vanco --> doxy (2/24 - 3/5)?Hematology:Critical illness anemia- Sanguinous secretions, expected given tumor burden and coagulopathy, may worsen with radiation and baseline hematologic disease- Multivitamins and cofactors- Maintain Hgb >7R IJ thrombus / DVT prophylaxis- High risk heparin gtt - Maintain SCDs at all times?Hx of chronic ITP, Waldenstrom macroglobulinemia- Transfuse platelets if <30k, bleeding, or need for invasive procedure per heme- No need to treat ITP unless plts <30k- Appreciate hematology recommendations?Endocrine: Hx T2DM- Lantus 15 units BID- Insulin gtt (2/28 - )- HOLD home Metformin, Lantus 64u, Apidra 20u TID w meals- Appreciate diabetes team recs, signed off for now, reconsult when appropriateMusculoskeletal: Generalized deconditioning- OOB daily, PT/OT evals- Turn and reposition Q2 hours, PUP dressing?Device Review:- R IJ Port (08/12/20) per guidelines, nonocclusive DVT does not require removal of line- PEG (1/10)- Bivona trach (2/5)- Foley (2/17)?Code Status / Dispo:- Transition to DNR (see ACP note 2/25)- Plan for transfer to med onc service when ready for floor (med onc in agreement, have signed off while in SICU; reconsult when ready for floor / if needed for GOC discussions)- Appreciate palliative care assistance with GOC discussionsNotifications : For questions please call SICU Covering Provider listed in [facility] / EpicSigned:Zaedyn Covin, PA-C3/7/2022SICU [facility] Dynamic Roles (secondary)[facility] 7-1 APP			[phone]

## 2020-11-09 NOTE — Progress Notes
Otolaryngology Progress NoteDate: 11/09/2020 Interim History- pending custom trachVitalsTemp:  [37.2 ?C-38 ?C] 37.2 ?CPulse:  [77-125] 79Resp:  [5-31] 19BP: (91-160)/(46-95) 103/53SpO2:  [94 %-100 %] 100 %Device (Oxygen Therapy): mechanical ventilator I/O last 3 completed shifts:In: 2344.5 [P.O.:30; I.V.:694.5; NG/GT:1620]Out: 2850 [Urine:2850]Physical ExamGen: alert, vented PCEyes: open, normal external appearanceFace: grossly symmetricNose: clear anteriorlyOP/OC: hemostaticNeck: Bivona 8 cuffed secured w/soft trach ties, necrotic but hemostatic mass surrounding the stoma, remaining neck flat, firm tumor Pulm: no increased WOB on TMDiagnostic DataReviewed, per chartAssessment & Plan54 M with PMH of HTN, HLD, DM, tobacco and EtOH use, Waldenstrom macroglobulinemia, 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 Admitted to SICU for ventilatory support. No acute ENT intervention planned; no curative or meaningful surgical options at this time. Course complicated by multiple episodes of Bivona trach occlusion/obstruction.  - custom Bivona trach ordered- no ENT intervention planned at this time; case discussed at tumor board 2/14 and is not a surgical candidate - disease management/goals-of-care best pursued via non-surgical means; eg, Medical Oncology, Radiation Oncology, Palliative Care- transfer to Medical Oncology floor when eligible - continue routine trach care with careful suctioning- ensure trach well secured with tiesElectronically Signed ZO:XWRUE Dustyn Dansereau DDSContact: [id number]/7/20227:48 AM

## 2020-11-09 NOTE — Progress Notes
Surgical Intensive Care Unit  AttendingI have personally performed a face to face diagnostic evaluation on this patient, reviewed the chart, available data and care plan and have personally formulated the plan outlined below. I have seen this patient in addition to the Surgical ICU advanced practitioner  given patient's complexity and associated need for critical care. 55 y.o. 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 Hospital Day 31 Past Medical History: Diagnosis Date ? Diabetes mellitus (HC Code) (HC CODE)  ? Hypercholesteremia  ? Hypertension  No past surgical history on file.Scheduled Meds:Current Facility-Administered Medications Medication Dose Route Frequency Provider Last Rate Last Admin ? albuterol neb sol 2.5 mg/3 mL (0.083%) (PROVENTIL,VENTOLIN)  2.5 mg Nebulization Q6H WA [place], [place], [place]   2.5 mg at 11/08/20 1936 ? ascorbic acid (vitamin C) (VITAMIN C) tablet 250 mg  250 mg Per G Tube Daily Jennye Boroughs, [place]   250 mg at 11/08/20 1610 ? chlorhexidine gluconate (PERIDEX) 0.12 % solution 15 mL  15 mL Mouth/Throat Q12H Leonia Reeves, PA   15 mL at 11/08/20 2033 ? clonazePAM (KlonoPIN) tablet 0.5 mg  0.5 mg Per J Tube BID Mat Carne, APRN   0.5 mg at 11/08/20 2033 ? famotidine (PEPCID) tablet 20 mg  20 mg Per J Tube Daily [place], Lauren, PA   20 mg at 11/08/20 9604 ? folic acid (FOLVITE) tablet 1 mg  1 mg Per G Tube Daily [place], [place], [place]   1 mg at 11/08/20 5409 ? heparin (porcine) injection 7,500 Units  7,500 Units Subcutaneous Q8H Madlener, [place], [place]   7,500 Units at 11/09/20 0548 ? insulin glargine (Semglee,Lantus) injection 15 Units  15 Units Subcutaneous Q12H Sharyne Richters, PA   15 Units at 11/08/20 2033 ? miconazole nitrate (SECURA EXTRA THICK ANTIFUNGAL) 2 % cream   Topical (Top) BID Lisette Abu, [place]   Given at 11/08/20 2034 ? midazolam (PF) (VERSED) 1 mg/mL injection          ? Prosource no carb 15 gram protein and 60 kcal  1 packet Per G Tube TID Bettey Costa, PA   1 packet at 11/09/20 0006 ? sodium bicarbonate tablet 1,300 mg  1,300 mg Per G Tube TID Gearldine Shown, PA   1,300 mg at 11/09/20 0249 ? thiamine (VITAMIN B1) tablet 50 mg  50 mg Per G Tube Daily Jennye Boroughs, PA   50 mg at 11/08/20 8119 Continuous Infusions:? dexmedetomidine 0.7 mcg/kg/hr (11/09/20 0600) ? insulin regular human 100 units in sodium chloride 0.9 % 100 mL 3.5 mL/hr at 11/09/20 0600 PRN Meds: acetaminophen, carboxymethylcellulose, Initiate Insulin Infusion Protocol for Adult Patients (Nurse-Driven) **AND** [COMPLETED] insulin regular human **AND** insulin regular human 100 units in sodium chloride 0.9 % 100 mL **AND** POC Glucose (Fingerstick) **AND** Nursing communication **AND** dextrose injection **AND** dextrose injection, glucagon, HYDROmorphone, LORazepam, oxyCODONE, oxyCODONE, zinc oxideVITALS:Temp (24hrs), Avg:99.6 ?F (37.6 ?C), Min:98.96 ?F (37.2 ?C), Max:100.4 ?F (38 ?C)Vitals:  11/09/20 0339 11/09/20 0400 11/09/20 0500 11/09/20 0600 BP:  97/68 139/66 (!) 154/79 Pulse: (!) 93 76 85 (!) 104 Resp: (!) 26 16 20  (!) 5 Temp: 98.96 ?F (37.2 ?C) 98.96 ?F (37.2 ?C) 99.14 ?F (37.3 ?C) 99.32 ?F (37.4 ?C) TempSrc:  Bladder Bladder Bladder SpO2: 100% 97% 100% 100% Weight:     Height:     Intake/Output Summary (Last 24 hours) at 11/09/2020 0654Last data filed at 11/09/2020 0600Gross per 24 hour Intake 2321.51 ml Output 3125 ml Net -803.49 ml Physical ExamAs belowLabs Last 24 hours:Most  Recent Result Component Value Date/Time  Sodium 146 (H) 11/09/2020 04:59 AM  Potassium 5.0 11/09/2020 04:59 AM  Chloride 113 (H) 11/09/2020 04:59 AM  CO2 23 11/09/2020 04:59 AM  BUN 91 (H) 11/09/2020 04:59 AM  Creatinine 2.02 (H) 11/09/2020 04:59 AM  WBC 11.3 (H) 11/09/2020 04:59 AM  Hemoglobin 7.7 (L) 11/09/2020 04:59 AM  Hematocrit 25.40 (L) 11/09/2020 04:59 AM  Platelets 109 (L) 11/09/2020 04:59 AM Imaging Last 24 hours:No results found.Assessment / Plan:Neurologic:  Awake, following commands. Pain is being controlled with the current regimenSedation is being addressed by the current regimen including Precedex drip. Prn Ativan.Will try to wean Precedex, with clonidine and possibly increasing AtivanCardiac: Heart rate and blood pressure are in acceptable range.Respiratory: Respiratory failure requiring mechanical ventilation. On PCV with extended PS trials PEEP 5 FiO2 40%Wean O2 as tolerated. Continue PS trials. Will try 8/5. Infectious Disease/Sepsis: Afebrile, WBC 11.3Currently not on antibioticsRenal, Fluids/Electrolytes: Appropriate urine output.Acute kidney injury. Creatine 2Hyperkalemia improved with K 5. Maintain Foley catheter for the strict monitoring of I/O in this critically ill patient.GI/Nutrition: Protein-calorie malnutrition -  On tube feeds.Hematology:Acute blood loss anemia. Hgb 7.7 No acute indication for transfusion.     VTE prophylaxis with Hep SC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 Will retry Heparin drip. Endocrine:  Insulin protocol for hyperglycemiaLantusMusculoskeletal:  PTSkin/wound: Frequent repositioning to mitigate against pressure ulcerationGoals of Care/Advance Directives:Code status:   DNRProphylaxis:  As abovePlan discussed with:  Primary Service, Critical Care Nursing, Respiratory TherapyThis patient is critically ill as indicated by the following Acute Respiratory FailureThe patient has required 32 minutes of my undivided attention during the past 24 hours for one of the previous mentioned life threatening diagnoses. The critical care interventions have included: Management of Acute Respiratory Failure with adjustments to ventilator settings/mode to maintain adequate ventilation / oxygenation  and initiation of spontaneous breathing trials Wendee Copp, MD SICU Attending

## 2020-11-10 ENCOUNTER — Inpatient Hospital Stay: Admit: 2020-11-10 | Payer: PRIVATE HEALTH INSURANCE

## 2020-11-10 DIAGNOSIS — J9601 Acute respiratory failure with hypoxia: Secondary | ICD-10-CM

## 2020-11-10 LAB — CBC WITH AUTO DIFFERENTIAL
BKR WAM ABSOLUTE IMMATURE GRANULOCYTES.: 0.14 x 1000/ÂµL (ref 0.00–0.30)
BKR WAM ABSOLUTE LYMPHOCYTE COUNT.: 0.39 x 1000/ÂµL — ABNORMAL LOW (ref 0.60–3.70)
BKR WAM ABSOLUTE NRBC (2 DEC): 0 x 1000/ÂµL (ref 0.00–1.00)
BKR WAM ANALYZER ANC: 8.69 x 1000/ÂµL — ABNORMAL HIGH (ref 2.00–7.60)
BKR WAM BASOPHIL ABSOLUTE COUNT.: 0.04 x 1000/ÂµL (ref 0.00–1.00)
BKR WAM BASOPHILS: 0.4 % (ref 0.0–1.4)
BKR WAM EOSINOPHIL ABSOLUTE COUNT.: 0.41 x 1000/ÂµL (ref 0.00–1.00)
BKR WAM EOSINOPHILS: 3.9 % (ref 0.0–5.0)
BKR WAM HEMATOCRIT (2 DEC): 27.4 % — ABNORMAL LOW (ref 38.50–50.00)
BKR WAM HEMOGLOBIN: 8.1 g/dL — ABNORMAL LOW (ref 13.2–17.1)
BKR WAM IMMATURE GRANULOCYTES: 1.3 % — ABNORMAL HIGH (ref 0.0–1.0)
BKR WAM LYMPHOCYTES: 3.7 % — ABNORMAL LOW (ref 17.0–50.0)
BKR WAM MCH (PG): 26.6 pg — ABNORMAL LOW (ref 27.0–33.0)
BKR WAM MCHC: 29.6 g/dL — ABNORMAL LOW (ref 31.0–36.0)
BKR WAM MCV: 89.8 fL (ref 80.0–100.0)
BKR WAM MONOCYTE ABSOLUTE COUNT.: 0.86 x 1000/ÂµL (ref 0.00–1.00)
BKR WAM MONOCYTES: 8.2 % (ref 4.0–12.0)
BKR WAM MPV: 12.5 fL — ABNORMAL HIGH (ref 8.0–12.0)
BKR WAM NEUTROPHILS: 82.5 % — ABNORMAL HIGH (ref 39.0–72.0)
BKR WAM NUCLEATED RED BLOOD CELLS: 0 % (ref 0.0–1.0)
BKR WAM PLATELETS: 136 x1000/ÂµL — ABNORMAL LOW (ref 150–420)
BKR WAM RDW-CV: 17.6 % — ABNORMAL HIGH (ref 11.0–15.0)
BKR WAM RED BLOOD CELL COUNT.: 3.05 M/ÂµL — ABNORMAL LOW (ref 4.00–6.00)
BKR WAM WHITE BLOOD CELL COUNT: 10.5 x1000/ÂµL (ref 4.0–11.0)

## 2020-11-10 LAB — BASIC METABOLIC PANEL
BKR ANION GAP: 9 (ref 7–17)
BKR BLOOD UREA NITROGEN: 84 mg/dL — ABNORMAL HIGH (ref 6–20)
BKR BUN / CREAT RATIO: 44.2 — ABNORMAL HIGH (ref 8.0–23.0)
BKR CALCIUM: 10 mg/dL (ref 8.8–10.2)
BKR CHLORIDE: 114 mmol/L — ABNORMAL HIGH (ref 98–107)
BKR CO2: 24 mmol/L (ref 20–30)
BKR CREATININE: 1.9 mg/dL — ABNORMAL HIGH (ref 0.40–1.30)
BKR EGFR (AFR AMER): 45 mL/min/{1.73_m2} (ref >60)
BKR EGFR (NON AFRICAN AMERICAN): 37 mL/min/{1.73_m2} (ref >60)
BKR GLUCOSE: 226 mg/dL — ABNORMAL HIGH (ref 70–100)
BKR POTASSIUM: 5 mmol/L (ref 3.3–5.3)
BKR SODIUM: 147 mmol/L — ABNORMAL HIGH (ref 136–144)

## 2020-11-10 LAB — PHOSPHORUS     (BH GH L LMW YH): BKR PHOSPHORUS: 4.1 mg/dL (ref 2.2–4.5)

## 2020-11-10 LAB — MAGNESIUM: BKR MAGNESIUM: 2.3 mg/dL (ref 1.7–2.4)

## 2020-11-10 LAB — PARTIAL THROMBOPLASTIN TIME     (BH GH LMW Q YH)
BKR PARTIAL THROMBOPLASTIN TIME: 43.8 s — ABNORMAL HIGH (ref 23.0–31.4)
BKR PARTIAL THROMBOPLASTIN TIME: 40.4 s — ABNORMAL HIGH (ref 23.0–31.4)

## 2020-11-10 MED ORDER — HALOPERIDOL LACTATE 5 MG/ML INJECTION SOLUTION
5 mg/mL | INTRAVENOUS | Status: DC | PRN
Start: 2020-11-10 — End: 2020-11-11
  Administered 2020-11-10: 5 mL via INTRAVENOUS

## 2020-11-10 MED ORDER — LORAZEPAM 2 MG/ML INJECTION SOLUTION
2 mg/mL | INTRAVENOUS | Status: DC | PRN
Start: 2020-11-10 — End: 2020-11-11
  Administered 2020-11-11: 2 mL via INTRAVENOUS

## 2020-11-10 MED ORDER — INSULIN GLARGINE 100 UNIT/ML SUBCUTANEOUS SOLUTION
100 unit/mL | Freq: Two times a day (BID) | SUBCUTANEOUS | Status: DC
Start: 2020-11-10 — End: 2020-11-12
  Administered 2020-11-11 – 2020-11-12 (×4): 100 mL via SUBCUTANEOUS

## 2020-11-10 MED ORDER — INSULIN GLARGINE 100 UNIT/ML SUBCUTANEOUS SOLUTION
100 unit/mL | Freq: Once | SUBCUTANEOUS | Status: CP
Start: 2020-11-10 — End: ?
  Administered 2020-11-10: 15:00:00 100 mL via SUBCUTANEOUS

## 2020-11-10 NOTE — Progress Notes
Surgical Intensive Care Unit  AttendingI have personally performed a face to face diagnostic evaluation on this patient, reviewed the chart, available data and care plan and have personally formulated the plan outlined below. I have seen this patient in addition to the Surgical ICU advanced practitioner  given patient's complexity and associated need for critical care. 55 y.o. 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 Hospital Day 32 Past Medical History: Diagnosis Date ? Diabetes mellitus (HC Code) (HC CODE)  ? Hypercholesteremia  ? Hypertension  No past surgical history on file.Scheduled Meds:Current Facility-Administered Medications Medication Dose Route Frequency Provider Last Rate Last Admin ? albuterol neb sol 2.5 mg/3 mL (0.083%) (PROVENTIL,VENTOLIN)  2.5 mg Nebulization Q6H WA [place], [place], [place]   2.5 mg at 11/10/20 0981 ? ascorbic acid (vitamin C) (VITAMIN C) tablet 250 mg  250 mg Per G Tube Daily Jennye Boroughs, PA   250 mg at 11/10/20 1914 ? chlorhexidine gluconate (PERIDEX) 0.12 % solution 15 mL  15 mL Mouth/Throat Q12H Leonia Reeves, PA   15 mL at 11/10/20 7829 ? clonazePAM (KlonoPIN) tablet 0.5 mg  0.5 mg Per J Tube BID Mat Carne, APRN   0.5 mg at 11/10/20 5621 ? cloNIDine HCL (CATAPRES) tablet 0.2 mg  0.2 mg Per G Tube Q6H Madlener, Stephanie, PA   0.2 mg at 11/10/20 0520  Followed by ? Melene Muller ON 11/11/2020] cloNIDine HCL (CATAPRES) tablet 0.2 mg  0.2 mg Per G Tube Q8H Madlener, Judeth [place], PA      Followed by ? Melene Muller ON 11/13/2020] cloNIDine HCL (CATAPRES) tablet 0.2 mg  0.2 mg Per G Tube Q12H Madlener, Judeth [place], PA      Followed by ? Melene Muller ON 11/16/2020] cloNIDine HCL (CATAPRES) tablet 0.2 mg  0.2 mg Per G Tube Q24H Madlener, Judeth [place], PA     ? famotidine (PEPCID) tablet 20 mg  20 mg Per J Tube Daily Cusick, Lauren, PA   20 mg at 11/10/20 3086 ? folic acid (FOLVITE) tablet 1 mg  1 mg Per G Tube Daily [place], [place]   1 mg at 11/10/20 5784 ? insulin glargine (Semglee,Lantus) injection 15 Units  15 Units Subcutaneous Q12H Sharyne Richters, PA   15 Units at 11/10/20 6962 ? miconazole nitrate (SECURA EXTRA THICK ANTIFUNGAL) 2 % cream   Topical (Top) BID Lisette Abu, [place]   Given at 11/10/20 9528 ? midazolam (PF) (VERSED) 1 mg/mL injection          ? polyethylene glycol (MIRALAX) packet 17 g  17 g Per G Tube Daily Madl[place], [place], [place]   17 g at 11/10/20 4132 ? Prosource no carb 15 gram protein and 60 kcal  1 packet Per G Tube TID Bettey Costa, PA   1 packet at 11/10/20 4401 ? senna (SENOKOT) tablet 8.6 mg  1 tablet Per G Tube BID Gearldine Shown, PA   8.6 mg at 11/10/20 0272 ? sodium bicarbonate tablet 650 mg  650 mg Per G Tube TID Gearldine Shown, PA   650 mg at 11/10/20 5366 ? thiamine (VITAMIN B1) tablet 50 mg  50 mg Per G Tube Daily Jennye Boroughs, PA   50 mg at 11/10/20 4403 Continuous Infusions:? dexmedetomidine 0.4 mcg/kg/hr (11/10/20 0900) ? heparin 25,000 units/250 ml infusion - High Risk of Bleeding 12 Units/kg/hr (11/10/20 0900) ? insulin regular human 100 units in sodium chloride 0.9 % 100 mL 4.5 mL/hr at 11/10/20 0900 PRN Meds: acetaminophen, carboxymethylcellulose, Initiate Insulin Infusion Protocol for Adult Patients (Nurse-Driven) **AND** [COMPLETED]  insulin regular human **AND** insulin regular human 100 units in sodium chloride 0.9 % 100 mL **AND** POC Glucose (Fingerstick) **AND** Nursing communication **AND** dextrose injection **AND** dextrose injection, glucagon, HYDROmorphone, LORazepam, oxyCODONE, oxyCODONE, zinc oxideVITALS:Temp (24hrs), Avg:99.5 ?F (37.5 ?C), Min:98.78 ?F (37.1 ?C), Max:99.86 ?F (37.7 ?C)Vitals:  11/10/20 0600 11/10/20 0700 11/10/20 0800 11/10/20 0810 BP: (!) 144/65 (!) 171/97 (!) 178/80  Pulse: (!) 118 (!) 91 (!) 91 (!) 92 Resp: (!) 39 (!) 30 (!) 24 (!) 25 Temp: 99.86 ?F (37.7 ?C) 99.68 ?F (37.6 ?C) 99.5 ?F (37.5 ?C) 99.5 ?F (37.5 ?C) TempSrc: Bladder  Bladder  SpO2: 98% 100% 100% 100% Weight:     Height:     Intake/Output Summary (Last 24 hours) at 11/10/2020 0912Last data filed at 11/10/2020 0900Gross per 24 hour Intake 2489.94 ml Output 3935 ml Net -1445.06 ml Physical ExamAs belowLabs Last 24 hours:Most Recent Result Component Value Date/Time  Sodium 147 (H) 11/10/2020 05:18 AM  Potassium 5.0 11/10/2020 05:18 AM  Chloride 114 (H) 11/10/2020 05:18 AM  CO2 24 11/10/2020 05:18 AM  BUN 84 (H) 11/10/2020 05:18 AM  Creatinine 1.90 (H) 11/10/2020 05:18 AM  WBC 10.5 11/10/2020 05:18 AM  Hemoglobin 8.1 (L) 11/10/2020 05:18 AM  Hematocrit 27.40 (L) 11/10/2020 05:18 AM  Platelets 136 (L) 11/10/2020 05:18 AM  PTT 40.4 (H) 11/10/2020 01:57 AM Imaging Last 24 hours:No results found.Assessment / Plan:Neurologic:  Awake, following commands. Pain is being controlled with the current regimenSedation is being addressed by the current regimen including Precedex drip. Continue to wean Precedex. On clonidine and AtivanCardiac: Heart rate and blood pressure are in acceptable range.Respiratory: Respiratory failure requiring mechanical ventilation. On PS 10 PEEP 5 FiO2 40%Did not tolerate 8/5 yesterday.Follow up CXRInfectious Disease/Sepsis: Afebrile, WBC 10.5Currently not on antibioticsRenal, Fluids/Electrolytes: Appropriate urine output.Acute kidney injury. Creatine 1.9 from 2K stable at 5. Maintain Foley catheter for the strict monitoring of I/O in this critically ill patient.GI/Nutrition: Protein-calorie malnutrition -  On tube feeds.Hematology:Acute blood loss anemia. Hgb 8.1 No acute indication for transfusion.     VTE prophylaxis with Hep SC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 On Heparin drip. Endocrine:  Insulin protocol for hyperglycemiaLantusMusculoskeletal: PTSkin/wound: Frequent repositioning to mitigate against pressure ulcerationGoals of Care/Advance Directives:Code status:   DNRProphylaxis:  As abovePlan discussed with:  Primary Service, Critical Care Nursing, Respiratory TherapyThis patient is critically ill as indicated by the following Acute Respiratory FailureThe patient has required 32 minutes of my undivided attention during the past 24 hours for one of the previous mentioned life threatening diagnoses. The critical care interventions have included: Management of Acute Respiratory Failure with adjustments to ventilator settings/mode to maintain adequate ventilation / oxygenation  and initiation of spontaneous breathing trials Wendee Copp, MD SICU Attending

## 2020-11-10 NOTE — Progress Notes
[name]	SICU Progress NoteAttending Provider: Alric Quan, MDPost-Operative Day/Post Injury Day Procedure(s):2/5: [name] exchange (ENT) Hospital LOS: 31 days Interim History:  HPI: 55 y.o. male admitted to the SICU on 2/9 for ventilator management following an RRT on the oncology floor for desaturation and increased work of breathing while on trach mask and ultimately placed back on vent. ?Patient was initially a transfer from [facility] on 2/4 after trach and PEG when he had a partial obstruction of trach site from a new necrotic mass. He underwent trach exchange 2/5. Was on trach mask on oncology floor however had 2 episodes on floor requiring RRT for lavage and likely mucous plugging. ?Course Complicated by: - Incidental R IJ thrombus - Multiple episodes of presumed mucous plugging requiring RRT's ?PMH: Chronic ITP, Waldenstrom macroglobulinemia, HTN. HLD, DM, tobacco and alcohol use, Laryngeal SCC (weekly Cisplatin and RT)?PSH: None known ?Home meds: Lipitor, Pepcid, Lisinopril, Metformin, Oxycodone, miralax, Insulin U100 and Lantus SICU course: (see progress note from 3/6 for previous days events)3/6: PSV 10/5 as tolerated today, rest on PCV. Discussed hyperkalemia with pharmacy, OK for further lokelma doses but since patient not having BMs, likely not very effective. Will give dose of senna, consider standing bowel regimen tomorrow. Episode of acute agitation/tachycardia to 140s requiring increase in precedex gtt with good effect, minimal secretions. PM K+ 5.5(5.8) --> lokelma 10g. O/N: Lactulose x 1, no BM. K down to 5.0. Rested on PCV.3/7: Attempted PSV 8/5, immediately became tachypneic to mid 40's, HR to 140's; Rested on PCV x 1 hour then returned to PSV 10/5, will continue as tolerated. Clonidine taper started, PRN Ativan increased to 1mg  Q4h, attempting to wean Precedex. Heparin gtt started for known IJ thrombus. ON: Remained on 10/5. 3/8: 10/5 as tolerated. Increased secretions, CXR with L sided consolidation / effusion, though no effusion seen on bedside ultrasound. Low grade temp, plan to reculture if spikes temp or worsening oxygenation. Bicarb tabs d/c'ed, FWF Q4h added. Lantus increased to 25u BID in attempt to wean insulin gtt. 6:30pm: Noted to have trach dislodged, visible distress and desatting to teens; trach successfully replaced, CXR pending, ENT aware. Review of Allergies/Meds/Hx: I have reviewed the patient's: allergies, past medical history, past surgical history, family history, social history, prior to admission medicationsObjective: Vitals:I have reviewed the patient's current vital signs as documented in the EMRTemp:  [98.78 ?F (37.1 ?C)-99.86 ?F (37.7 ?C)] 99.14 ?F (37.3 ?C)Pulse:  [74-142] 81Resp:  [5-36] 19BP: (91-166)/(46-95) 103/55SpO2:  [94 %-100 %] 100 %SpO2: 100 %Device (Oxygen Therapy): mechanical ventilatorI/O's:I have reviewed the patient's current I&O's as documented in the EMR.Gross Totals (Last 24 hours) at 11/09/2020 2113Last data filed at 11/09/2020 1900Intake 2142.59 ml Output 3550 ml Net -1407.41 ml Physical ExamConstitutional: Middle aged trached man in NADHEENT: NCAT. PERRL, EOMI. External ears normal. Bivona trach midline with xeroform distal skinCardiovascular: RRR, no MRG appreciated. Distal pulses are 2+ bilaterally.Pulmonary: Trached on vent. Tachypneac at times. Suctioned for small amount of thin secretions.Abdominal: Soft, obese, non distended, non tender to palpation, +BS. PEG Tube site intactGenitourinary: Foley catheter in place, draining clear yellow urine.Musculoskeletal: BUE strength 4/5, BLE 2/5Neurological: Intermittent opens eyes, nods head, FSC. Agitated at timesSkin: Warm and dry.Psychiatric: Unable to assessLabs: Refer to EMR. I have reviewed the patient's labs within the last 24 hours; see assessment and plan below for significant abnormals.Microbiology:Refer to EMR. I have reviewed all new results within the last 24 hours; see assessment and plan below.Diagnostics:Refer to EMR. I have reviewed all new results within the  last 24 hours; see assessment and plan below.Chest ZO:XWRUE to EMR. I have reviewed all new results within the last 24 hours; see assessment and plan below.Abdominal AV:WUJWJ to EMR. I have reviewed all new results within the last 24 hours; see assessment and plan below.ECG/Tele Events: I have reviewed the patient's ECG and telemetry as resulted in the EMR.Assessment/Plan Neurologic: Analgesia- Tylenol 650 Q6 PRN - Oxycodone 10 / 15 Q4 PRN - Dilaudid 1 PRN breakthroughAnxiety / Depression- Precedex gtt, wean as able- Clonidine taper to assist with precedex wean- Clonazepam 0.5mg  BID- Haldol 2.5mg  IV Q4h PRN - first line for anxiety / agitation- Ativan 1mg  IV Q4 PRN - second line for anxiety, give 30 minutes after haldol if needed- Appreciate palliative care and psych consults?ENT: L laryngeal SCC invading tracheostomy; s/p trach exchange 10/10/20 and biopsy 10/12/20- Bivona trach to bypass obstruction - ENT planning for custom bivona trach- Unfortunately, there are no surgical / curative options- S/p palliative radiation (2/16, 2/17, 2/18, 2/21, 2/22, 2/23)- Appreciate med onc input, no current recommendations, signed off; reconsult when ready for transfer to floor (will go under med onc service) or if needed to assist with GOC discussions?Cardiac: Hx of HTN / HLD- Hold home lisinopril, lasix, crestor; resume when appropriate ?Respiratory:  Respiratory failure 2/2 circumferential tumor invasion of stoma, mucous plugging / trach occlusion- PSV 10/5 as tolerated, rest on PCV as needed- PCV IP 12, PEEP +5- S/p Decadron 8mg  BID for tumor inflammation (2/22 - 2/24) per med oncology- Albuterol Q6hGI: Protein-calorie malnutrition- TF at goal via PEG (Nepro 2/2 hyperkalemia)- FWF as below while on concentrated formula- Bowel regimenPUD prophylaxis- Pepcid while vented?Renal: AKI on CKD (baseline Cr 1.5-1.6), 2/2 supratherapeutic vancomycin level- Creat stable ~2, continue to trend- Foley replaced 2/17 for urinary retention Hypernatremia- FWF Q4hHyperkalemia, resolved- Medical management as needed - Nepro TF as aboveInfectious Disease:  MRSA pneumonia, resolved- S/p 10 day course of vanco --> doxy (2/24 - 3/5)- Reculture if febrile / worsening oxygenation given increased suctioning requirements?Hematology:Critical illness anemia- Sanguinous secretions, expected given tumor burden and coagulopathy, may worsen with radiation and baseline hematologic disease- Multivitamins and cofactors- Maintain Hgb >7R IJ thrombus / DVT prophylaxis- High risk heparin gtt - Maintain SCDs at all times?Hx of chronic ITP, Waldenstrom macroglobulinemia- Transfuse platelets if <30k, bleeding, or need for invasive procedure per heme- No need to treat ITP unless plts <30k- Appreciate hematology recommendations?Endocrine: Hx T2DM- Lantus 25 units BID- Insulin gtt (2/28 - )- HOLD home Metformin, Lantus 64u, Apidra 20u TID w meals- Appreciate diabetes team recs, signed off for now, reconsult when appropriateMusculoskeletal: Generalized deconditioning- OOB daily, PT/OT evals- Turn and reposition Q2 hours, PUP dressing?Device Review:- R IJ Port (08/12/20) per guidelines, nonocclusive DVT does not require removal of line- PEG (1/10)- Bivona trach (2/5)- Foley (2/17)?Code Status / Dispo:- Transition to DNR (see ACP note 2/25)- Plan for transfer to med onc service when ready for floor (med onc in agreement, have signed off while in SICU; reconsult when ready for floor / if needed for GOC discussions)- Appreciate palliative care assistance with GOC discussionsNotifications : For questions please call SICU Covering Provider [facility] / EpicSigned:Monty Mccarrell, PA-C3/7/2022SICU [facility] Dynamic Roles (secondary)YSC 7-1 APP			[phone]

## 2020-11-10 NOTE — Progress Notes
Otolaryngology Progress NoteDate: 11/10/2020 Interim History- pending custom trachVitalsTemp:  [98.78 ?F (37.1 ?C)-99.86 ?F (37.7 ?C)] 99.86 ?F (37.7 ?C)Pulse:  [74-142] 118Resp:  [10-39] 39BP: (94-166)/(43-102) 144/65SpO2:  [95 %-100 %] 98 %Device (Oxygen Therapy): mechanical ventilator I/O last 3 completed shifts:In: 2376.8 [I.V.:821.8; NG/GT:1555]Out: 4050 [Urine:4050]Physical ExamGen: alert, vented PCEyes: open, normal external appearanceFace: grossly symmetricNose: clear anteriorlyOP/OC: hemostaticNeck: Bivona 8 cuffed secured w/soft trach ties, necrotic but hemostatic mass surrounding the stoma, remaining neck flat, firm tumor Pulm: no increased WOB on TMDiagnostic DataReviewed, per chartAssessment & Plan54 M with PMH of HTN, HLD, DM, tobacco and EtOH use, Waldenstrom macroglobulinemia, 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 Admitted to SICU for ventilatory support. No acute ENT intervention planned; no curative or meaningful surgical options at this time. Course complicated by multiple episodes of Bivona trach occlusion/obstruction.  - custom Bivona trach ordered- no ENT intervention planned at this time; case discussed at tumor board 2/14 and is not a surgical candidate - disease management/goals-of-care best pursued via non-surgical means; eg, Medical Oncology, Radiation Oncology, Palliative Care- transfer to Medical Oncology floor when eligible - continue routine trach care with careful suctioning- ensure trach well secured with tiesElectronically Signed ZO:XWRUE Emelee Rodocker DDSContact: MHB3/8/20227:48 AM

## 2020-11-10 NOTE — Plan of Care
Plan of Care Overview/ Patient Status    Pt sedated with precedex gtt, attempting to wean, pt opening eyes spontaneously, following commands and nodding yes/no to simple questions, PERRL, MAE 3/5 strength. Pt with intermittent restlessness/agitation managed with PRN oxy, dilauded, ativan with good effect to maintain RASS -1 to 0 and manage tachypnea/vent synchrony. Ativan d/c'd in pm, PRN haldol initiated. Tmax 100.4, PRN tylenol given with good effect. HR NSR 90-100s, ST to 130s when agitated, SBP 120-180s. Heparin gtt (high risk protocol) maintained per protocol. Pt tolerating CPAP 10/5 40%, requiring suctioning for thin tan secretions through trach q26min - 2h. Trach site macerated, cleansed with sterile normal saline and dressed with xeroform and gauze, trach ties changed. Ventilator circuit exchanged due to secretions in tubes. TF at goal through PEG with residuals <200, FWF initiated for hypernatremia, insulin gtt titrated per protocol, BID Lantus increased. No BM this shift, bowel regimen continued. Foley in place with adequate yellow UOP. T/p q2h and PRN. B/l wrist restraints in place for safety. Please refer to flowsheets for additional information.

## 2020-11-10 NOTE — Other
[facility] SICUPsychiatry Consult Service Progress Note3/8/2022Patient Name:  Arthur RaleighMRN:  [id number] Patient Class:  InpatientSUBJECTIVE Interim History: Precedex weaned yesterday. Per PM RN note, mental status ranging from sedated to agitated/anxious. When awake, able to follow commands/answer questions but is anxious/difficult to redirect. IV Ativan dose/frequency increased to target anxiety.  Subjective: On evaluation, he was sedated. Opened eyes to voice but was not able to follow commands. Review of Systems Unable to perform ROS: Mental status change OBJECTIVE I have reviewed the patient's current medications, allergies, past medical history, past surgical history, family history and social historyCurrent MedicationsCurrent Facility-Administered Medications Medication Dose Route Frequency Last Rate ? acetaminophen  650 mg Per G Tube Q6H PRN   ? albuterol  2.5 mg Nebulization Q6H WA   ? ascorbic acid  250 mg Per G Tube Daily   ? carboxymethylcellulose  1 drop Both Eyes TID PRN   ? chlorhexidine gluconate  15 mL Mouth/Throat Q12H   ? clonazePAM  0.5 mg Per J Tube BID   ? cloNIDine HCL  0.2 mg Per G Tube Q6H    Followed by ? [START ON 11/11/2020] cloNIDine HCL  0.2 mg Per G Tube Q8H    Followed by ? [START ON 11/13/2020] cloNIDine HCL  0.2 mg Per G Tube Q12H    Followed by ? [START ON 11/16/2020] cloNIDine HCL  0.2 mg Per G Tube Q24H   ? dexmedetomidine  0.2-0.7 mcg/kg/hr (Dosing Weight) Intravenous Continuous 0.4 mcg/kg/hr (11/10/20 1000) ? insulin regular human 100 units in sodium chloride 0.9 % 100 mL   Intravenous Continuous 4.5 mL/hr at 11/10/20 1000  And ? dextrose injection  25 g Intravenous Q15 MIN PRN    And ? dextrose injection  12.5 g Intravenous Q15 MIN PRN   ? famotidine  20 mg Per J Tube Daily   ? folic acid  1 mg Per G Tube Daily   ? glucagon  1 mg Intramuscular Once PRN   ? heparin 25,000 units/250 ml infusion - High Risk of Bleeding  7-30 Units/kg/hr (Dosing Weight) Intravenous Continuous 12 Units/kg/hr (11/10/20 1000) ? HYDROmorphone  1 mg IV Push Q3H PRN   ? insulin glargine  25 Units Subcutaneous Q12H   ? LORazepam  1 mg IV Push Q4H PRN   ? miconazole nitrate   Topical (Top) BID   ? midazolam (PF)       ? oxyCODONE  10 mg Per G Tube Q4H PRN   ? oxyCODONE  15 mg Per G Tube Q4H PRN   ? polyethylene glycol  17 g Per G Tube Daily   ? amino acids-protein hydrolys  1 packet Per G Tube TID   ? senna  1 tablet Per G Tube BID   ? thiamine  50 mg Per G Tube Daily   ? zinc oxide  1 Application Topical (Top) DAILY PRN   Vital SignsTemp: (!) 100.04 ?F (37.8 ?C)Pulse: (!) 112Resp: (!) 25BP: (!) 157/73SpO2: 99 %Mental Status ExamGeneral AppearanceHabitus was heavy. Height was tall. Psychiatric ExaminationAttitude: sedatedPsychomotor Behavior: psychomotor slowing.Speech: Prosody: was mute.Mood: anxious Affect: Affective Quality was dysphoric.Affective Range: blunted Affective Intensity: subdued Associations: impaired Thought Content: unable to assessSuicidal Ideation: unable to assessViolent Ideation: unable to assessCognitive EvaluationSensorium: was lethargicLab Results (last 24 hours)Recent Results (from the past 24 hour(s)) POC Glucose (Fingerstick)  Collection Time: 11/09/20 11:18 AM Result Value Ref Range  Glucose, Meter 125 (H) 70 - 100 mg/dL POC Glucose (Fingerstick)  Collection Time: 11/09/20  1:29 PM Result Value  Ref Range  Glucose, Meter 132 (H) 70 - 100 mg/dL POC Glucose (Fingerstick)  Collection Time: 11/09/20  3:10 PM Result Value Ref Range  Glucose, Meter 125 (H) 70 - 100 mg/dL POC Glucose (Fingerstick)  Collection Time: 11/09/20  5:06 PM Result Value Ref Range  Glucose, Meter 106 (H) 70 - 100 mg/dL POC Glucose (Fingerstick)  Collection Time: 11/09/20 6:05 PM Result Value Ref Range  Glucose, Meter 100 70 - 100 mg/dL POC Glucose (Fingerstick)  Collection Time: 11/09/20  7:16 PM Result Value Ref Range  Glucose, Meter 123 (H) 70 - 100 mg/dL Partial thromboplastin time  Collection Time: 11/09/20  7:33 PM Result Value Ref Range  PTT 43.8 (H) 23.0 - 31.4 seconds POC Glucose (Fingerstick)  Collection Time: 11/09/20  8:04 PM Result Value Ref Range  Glucose, Meter 118 (H) 70 - 100 mg/dL POC Glucose (Fingerstick)  Collection Time: 11/09/20 10:03 PM Result Value Ref Range  Glucose, Meter 136 (H) 70 - 100 mg/dL POC Glucose (Fingerstick)  Collection Time: 11/10/20 12:03 AM Result Value Ref Range  Glucose, Meter 164 (H) 70 - 100 mg/dL POC Glucose (Fingerstick)  Collection Time: 11/10/20  1:03 AM Result Value Ref Range  Glucose, Meter 121 (H) 70 - 100 mg/dL Partial thromboplastin time  Collection Time: 11/10/20  1:57 AM Result Value Ref Range  PTT 40.4 (H) 23.0 - 31.4 seconds POC Glucose (Fingerstick)  Collection Time: 11/10/20  1:58 AM Result Value Ref Range  Glucose, Meter 184 (H) 70 - 100 mg/dL POC Glucose (Fingerstick)  Collection Time: 11/10/20  2:59 AM Result Value Ref Range  Glucose, Meter 176 (H) 70 - 100 mg/dL POC Glucose (Fingerstick)  Collection Time: 11/10/20  4:05 AM Result Value Ref Range  Glucose, Meter 196 (H) 70 - 100 mg/dL Magnesium  Collection Time: 11/10/20  5:18 AM Result Value Ref Range  Magnesium 2.3 1.7 - 2.4 mg/dL Phosphorus     (BH GH L LMW YH)  Collection Time: 11/10/20  5:18 AM Result Value Ref Range  Phosphorus 4.1 2.2 - 4.5 mg/dL Basic metabolic panel  Collection Time: 11/10/20  5:18 AM Result Value Ref Range  Sodium 147 (H) 136 - 144 mmol/L  Potassium 5.0 3.3 - 5.3 mmol/L  Chloride 114 (H) 98 - 107 mmol/L  CO2 24 20 - 30 mmol/L  Anion Gap 9 7 - 17  Glucose 226 (H) 70 - 100 mg/dL  BUN 84 (H) 6 - 20 mg/dL  Creatinine 5.95 (H) 6.38 - 1.30 mg/dL  Calcium 75.6 8.8 - 43.3 mg/dL  BUN/Creatinine Ratio 29.5 (H) 8.0 - 23.0  eGFR (Afr Amer) 45 >60 mL/min/1.76m2  eGFR (NON African-American) 37 >60 mL/min/1.93m2 CBC auto differential  Collection Time: 11/10/20  5:18 AM Result Value Ref Range  WBC 10.5 4.0 - 11.0 x1000/?L  RBC 3.05 (L) 4.00 - 6.00 M/?L  Hemoglobin 8.1 (L) 13.2 - 17.1 g/dL  Hematocrit 18.84 (L) 16.60 - 50.00 %  MCV 89.8 80.0 - 100.0 fL  MCH 26.6 (L) 27.0 - 33.0 pg  MCHC 29.6 (L) 31.0 - 36.0 g/dL  RDW-CV 63.0 (H) 16.0 - 15.0 %  Platelets 136 (L) 150 - 420 x1000/?L  MPV 12.5 (H) 8.0 - 12.0 fL  Neutrophils 82.5 (H) 39.0 - 72.0 %  Lymphocytes 3.7 (L) 17.0 - 50.0 %  Monocytes 8.2 4.0 - 12.0 %  Eosinophils 3.9 0.0 - 5.0 %  Basophil 0.4 0.0 - 1.4 %  Immature Granulocytes 1.3 (H) 0.0 - 1.0 %  nRBC 0.0 0.0 - 1.0 %  ANC(Abs Neutrophil Count) 8.69 (H) 2.00 - 7.60 x 1000/?L  Absolute Lymphocyte Count 0.39 (L) 0.60 - 3.70 x 1000/?L  Monocyte Absolute Count 0.86 0.00 - 1.00 x 1000/?L  Eosinophil Absolute Count 0.41 0.00 - 1.00 x 1000/?L  Basophil Absolute Count 0.04 0.00 - 1.00 x 1000/?L  Absolute Immature Granulocyte Count 0.14 0.00 - 0.30 x 1000/?L  Absolute nRBC 0.00 0.00 - 1.00 x 1000/?L POC Glucose (Fingerstick)  Collection Time: 11/10/20  5:18 AM Result Value Ref Range  Glucose, Meter 209 (H) 70 - 100 mg/dL POC Glucose (Fingerstick)  Collection Time: 11/10/20  5:59 AM Result Value Ref Range  Glucose, Meter 180 (H) 70 - 100 mg/dL POC Glucose (Fingerstick)  Collection Time: 11/10/20  7:11 AM Result Value Ref Range  Glucose, Meter 171 (H) 70 - 100 mg/dL POC Glucose (Fingerstick)  Collection Time: 11/10/20  7:58 AM Result Value Ref Range  Glucose, Meter 174 (H) 70 - 100 mg/dL POC Glucose (Fingerstick)  Collection Time: 11/10/20  8:57 AM Result Value Ref Range  Glucose, Meter 168 (H) 70 - 100 mg/dL POC Glucose (Fingerstick)  Collection Time: 11/10/20 10:03 AM Result Value Ref Range  Glucose, Meter 159 (H) 70 - 100 mg/dL Assessment Fern Martha is a 55 yo man w/ PMHx SCC of the larynx, HTN, HLD, DM2 who presented to [facility] as a transfer from OSH for 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 At that time, he was initially started on doxepin 10mg  for sleep and clonazepam 0.5mg  BID for panic. ?Patient continues to have persistent altered mental status in the context of aspiration pneumonia. Team concerned about worsening depression given periods of crying as well as him being increasingly withdrawn. This seems to have decreased in setting of transition from propofol to Precedex, which can have an anxiolytic effect. As Precedex has been weaned, he has had periods of increased anxiety/agitation, for which IV Ativan has been beneficial. Can consider adding on Haldol to work synergistically with Ativan, with the hope of reducing the overall benzodiazepine burden.Psychiatric DiagnosisPanic disorderDeliriumActive Hospital Problems  Diagnosis ? Principal Problem/Diagnosis:  Laryngeal cancer (HC Code) (HC CODE) [C32.9] ? Acute hypoxemic respiratory failure (HC Code) (HC CODE) [J96.01] ? Difficult airway [T88.4XXA] ? 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.3] No data recordedSAFETY AND RISK ASSESSMENT PSY RISK ASSESSMENT SAFE-T WITH C-SSRS  Reason for Assessment:  Re-EvaluationC-SSRS: Suicidal Ideation:  Unable to AssessRisk Assessment:   Access to Lethal Methods:  Unable to AssessRisk to Self - Self-Injurious Behavior:   Recent Self-Injury:  No  History of Self Injury:  NoRisk to Others:   Current Agitation:  No  Homicidal/Aggressive Ideation:  No  Homicidal/Aggressive Threat/Plan:  No  Recent Violence/Aggression:  Yes  Imminent Risk for Violence in Community:  Low  Imminent Risk for Violence in Facility:  ModerateWithdrawal Risk:      Alcohol/Benzodiazepine/Barbiturate Risk for Withdrawal:  Low     Opioid Risk for Withdrawal:  LowPLAN #Anxiety#Agitation- Continue lorazepam 1mg  q4h PRN anxiety.- Start haloperidol 2.5mg  IV q4h PRN anxiety/agitation. May give concurrently with lorazepam.- If team would prefer to use medications sequentially, would use Haldol first and if ineffective after 30 min, can use lorazepam second-line.- QTc 415 on 3/3. Will keep an eye on his QTc if he needs escalating doses of Haldol. Appreciate team keeping K >4 and Mg >2 in order to minimize electrolyte contribution to QTc  prolongation.Discussed with Dr. Rayvon Char.Electronically Artemio Aly, MD PGY4MHB [phone] 4:30PM & Weekends/Holidays:Psychiatry Emergency Pager [phone]/8/202210:29 AM

## 2020-11-10 NOTE — Plan of Care
Plan of Care Overview/ Patient Status    Precedex weaned to 0.4 mcg/kg/hr. RASS -1 to +2. Pt remains intermittently restless/agitated. Pt opens eyes spontaneously, follows commands, nods yes/no to simple question, pupils PERRL, MAE 3/5. CPOT in use to evaluate need for analgesia, PRN Oxycodone, Dilaudid, and Ativan given. Bilateral wrist restraints maintained for safety. Pt in NSR/sinus tach; HR 80-110's, BP 150-90/80-40's; tmax 99.8. Heparin gtt remains at 12 units/kg/hr - therapeutic x2, continue with daily PTT. AM labs sent, no repletions required - K 5.0. Pt remained on CPAP 10/5 40% over night - tolerating well, with SpO2 >95%. Lung sounds diminished/coarse. Suctioning requirements Q32min, thin tan secretions. Trach site macerated --> cleansed, new xeroform and gauze in place. Pt remains on tube feed, Nepro at 65 ml/hr via PEG, minimal residuals. Insulin gtt titrated per critical care protocol, currently infusing at 3.5 units/hr. No BM this shift. Foley with > 100 ml/hr. Skin per flowsheet. Pt turned and repositioned q2hr. For additional information please see flowsheet

## 2020-11-11 ENCOUNTER — Inpatient Hospital Stay: Admit: 2020-11-11 | Payer: PRIVATE HEALTH INSURANCE

## 2020-11-11 DIAGNOSIS — J9601 Acute respiratory failure with hypoxia: Secondary | ICD-10-CM

## 2020-11-11 DIAGNOSIS — C329 Malignant neoplasm of larynx, unspecified: Secondary | ICD-10-CM

## 2020-11-11 LAB — CBC WITH AUTO DIFFERENTIAL
BKR WAM ABSOLUTE IMMATURE GRANULOCYTES.: 0.1 x 1000/ÂµL (ref 0.00–0.30)
BKR WAM ABSOLUTE LYMPHOCYTE COUNT.: 0.33 x 1000/ÂµL — ABNORMAL LOW (ref 0.60–3.70)
BKR WAM ABSOLUTE NRBC (2 DEC): 0 x 1000/ÂµL (ref 0.00–1.00)
BKR WAM ANALYZER ANC: 7.11 x 1000/ÂµL (ref 2.00–7.60)
BKR WAM BASOPHIL ABSOLUTE COUNT.: 0.02 x 1000/ÂµL (ref 0.00–1.00)
BKR WAM BASOPHILS: 0.2 % (ref 0.0–1.4)
BKR WAM EOSINOPHIL ABSOLUTE COUNT.: 0.41 x 1000/ÂµL (ref 0.00–1.00)
BKR WAM EOSINOPHILS: 4.6 % (ref 0.0–5.0)
BKR WAM HEMATOCRIT (2 DEC): 24.9 % — ABNORMAL LOW (ref 38.50–50.00)
BKR WAM HEMOGLOBIN: 7.3 g/dL — ABNORMAL LOW (ref 13.2–17.1)
BKR WAM IMMATURE GRANULOCYTES: 1.1 % — ABNORMAL HIGH (ref 0.0–1.0)
BKR WAM LYMPHOCYTES: 3.7 % — ABNORMAL LOW (ref 17.0–50.0)
BKR WAM MCH (PG): 26.4 pg — ABNORMAL LOW (ref 27.0–33.0)
BKR WAM MCHC: 29.3 g/dL — ABNORMAL LOW (ref 31.0–36.0)
BKR WAM MCV: 90.2 fL (ref 80.0–100.0)
BKR WAM MONOCYTE ABSOLUTE COUNT.: 0.93 x 1000/ÂµL (ref 0.00–1.00)
BKR WAM MONOCYTES: 10.4 % (ref 4.0–12.0)
BKR WAM MPV: 12.2 fL — ABNORMAL HIGH (ref 8.0–12.0)
BKR WAM NEUTROPHILS: 80 % — ABNORMAL HIGH (ref 39.0–72.0)
BKR WAM NUCLEATED RED BLOOD CELLS: 0 % (ref 0.0–1.0)
BKR WAM PLATELETS: 128 x1000/ÂµL — ABNORMAL LOW (ref 150–420)
BKR WAM RDW-CV: 17.6 % — ABNORMAL HIGH (ref 11.0–15.0)
BKR WAM RED BLOOD CELL COUNT.: 2.76 M/ÂµL — ABNORMAL LOW (ref 4.00–6.00)
BKR WAM WHITE BLOOD CELL COUNT: 8.9 x1000/ÂµL (ref 4.0–11.0)

## 2020-11-11 LAB — PARTIAL THROMBOPLASTIN TIME     (BH GH LMW Q YH)
BKR PARTIAL THROMBOPLASTIN TIME: 37 s — ABNORMAL HIGH (ref 23.0–31.4)
BKR PARTIAL THROMBOPLASTIN TIME: 34.8 s — ABNORMAL HIGH (ref 23.0–31.4)

## 2020-11-11 LAB — BASIC METABOLIC PANEL
BKR ANION GAP: 9 (ref 7–17)
BKR BLOOD UREA NITROGEN: 83 mg/dL — ABNORMAL HIGH (ref 6–20)
BKR BUN / CREAT RATIO: 42.8 — ABNORMAL HIGH (ref 8.0–23.0)
BKR CALCIUM: 9.7 mg/dL (ref 8.8–10.2)
BKR CHLORIDE: 114 mmol/L — ABNORMAL HIGH (ref 98–107)
BKR CO2: 26 mmol/L (ref 20–30)
BKR CREATININE: 1.94 mg/dL — ABNORMAL HIGH (ref 0.40–1.30)
BKR EGFR (AFR AMER): 44 mL/min/{1.73_m2} (ref >60)
BKR EGFR (NON AFRICAN AMERICAN): 36 mL/min/{1.73_m2} (ref >60)
BKR GLUCOSE: 129 mg/dL — ABNORMAL HIGH (ref 70–100)
BKR POTASSIUM: 4.3 mmol/L (ref 3.3–5.3)
BKR SODIUM: 149 mmol/L — ABNORMAL HIGH (ref 136–144)

## 2020-11-11 LAB — MAGNESIUM: BKR MAGNESIUM: 2.2 mg/dL (ref 1.7–2.4)

## 2020-11-11 LAB — PHOSPHORUS     (BH GH L LMW YH): BKR PHOSPHORUS: 3.9 mg/dL (ref 2.2–4.5)

## 2020-11-11 LAB — ALDOSTERONE: ALDOSTERONE: <1 ng/dL

## 2020-11-11 MED ORDER — MAGNESIUM HYDROXIDE 400 MG/5 ML ORAL SUSPENSION
4005 mg/5 mL | Freq: Every day | GASTROSTOMY | Status: DC
Start: 2020-11-11 — End: 2020-11-17
  Administered 2020-11-11 – 2020-11-17 (×7): 400 mL via GASTROSTOMY

## 2020-11-11 MED ORDER — DEXTROSE 5 % IN WATER (D5W) INTRAVENOUS SOLUTION
INTRAVENOUS | Status: DC
Start: 2020-11-11 — End: 2020-11-12
  Administered 2020-11-11 – 2020-11-12 (×2): 1000.000 mL/h via INTRAVENOUS

## 2020-11-11 MED ORDER — MAGNESIUM HYDROXIDE 400 MG/5 ML ORAL SUSPENSION
4005 mg/5 mL | Freq: Every day | GASTROSTOMY | Status: DC
Start: 2020-11-11 — End: 2020-11-11

## 2020-11-11 MED ORDER — LORAZEPAM 2 MG/ML INJECTION SOLUTION
2 mg/mL | Freq: Once | INTRAVENOUS | Status: CP
Start: 2020-11-11 — End: ?
  Administered 2020-11-12: 2 mL via INTRAVENOUS

## 2020-11-11 MED ORDER — LORAZEPAM 2 MG/ML INJECTION SOLUTION
2 mg/mL | Freq: Four times a day (QID) | INTRAVENOUS | Status: DC | PRN
Start: 2020-11-11 — End: 2020-11-17
  Administered 2020-11-11 – 2020-11-17 (×13): 2 mL via INTRAVENOUS

## 2020-11-11 NOTE — Plan of Care
Plan of Care Overview/ Patient Status    Neuro: Pt opens eyes easily to voice, able to follow simple commands and nod yes/no to questions, PERRL. MAE 3/5 strength, worked with PT with in-bed ROM exercises. Pt with intermittent restlessness/agitation, managed with PRN oxy, dilauded, ativan to maintain RASS -1 to 0 and to manage tachypnea/vent synchrony; precedex gtt titrated off in am. B/l wrist restraints in place for safety.CV: Tmax 100.8. HR NSR 90-100s, 130s when restless, SBP 120s-180s. Heparin gtt titrated per protocol, next PTT due 2030. Resp: Pt tolerating CPAP 10/5, 40%, requiring suction for thin tan secretions q54min - 1h. Trach exchanged x2 at bedside with ENT. Trach site macerated, cleansed with sterile normal saline, dressed with vasaline/xeroform, mepilex foam, gauze, bariatric foam trach ties in use. GI/GU: TF at goal through PEG with residuals <200, FWF and D5W initiated for hypernatremia, insulin gtt titrated per protocol, BID Lantus admin. No BM this shift, bowel regimen continued, milk of mag initiated. Foley in place with adequate yellow UOP. Social: Please refer to flowsheets for additional information.

## 2020-11-11 NOTE — Plan of Care
Plan of Care Overview/ Patient Status    Pt opens eyes spontaneously. Follows commands. MAE. Nods yes/no to simple questions appropriately. Dex gtt continued. Clonidine taper continued. PRN Oxycodone administered x3 w/good effect. PRN Dilaudid administered x3 w/good effect. PRN Ativan administered x1 in beginning of shift w/little effect for agitation. Bilat soft wrist restraints remain in place. tmax 100.9. NSR/ST 80-100's; 130-140's when agitated. SBP's 100-120's; 140-200's when agitated. Heparin gtt continued. R Chest Port remains in place. Daily labs obtained. Pt trached w/#8 [place] @ 14. Pt switched in PM to CPAP 10-5 from Pressure Support. Pt suctioned frequently for thin/thick, tan secretions. NPO. PEG tube remains in place, TF Nepro, infusing @ Goal of 65cc/hr. 200cc FWF continued Q4hr. Foley remains in place w/moderate yellow output. +Flatus. No BM this shift. [name], RN3/9/20226:47 AM

## 2020-11-11 NOTE — Progress Notes
Surgical Intensive Care Unit  AttendingI have personally performed a face to face diagnostic evaluation on this patient, reviewed the chart, available data and care plan and have personally formulated the plan outlined below. I have seen this patient in addition to the Surgical ICU advanced practitioner  given patient's complexity and associated need for critical care. 55 y.o. 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 Hospital Day 33 Past Medical History: Diagnosis Date ? Diabetes mellitus (HC Code) (HC CODE)  ? Hypercholesteremia  ? Hypertension  No past surgical history on file.Scheduled Meds:Current Facility-Administered Medications Medication Dose Route Frequency Provider Last Rate Last Admin ? albuterol neb sol 2.5 mg/3 mL (0.083%) (PROVENTIL,VENTOLIN)  2.5 mg Nebulization Q6H [place], [place], [place]   2.5 mg at 11/11/20 0813 ? ascorbic acid (vitamin C) (VITAMIN C) tablet 250 mg  250 mg Per G Tube Daily Jennye Boroughs, PA   250 mg at 11/11/20 0800 ? chlorhexidine gluconate (PERIDEX) 0.12 % solution 15 mL  15 mL Mouth/Throat Q12H Leonia Reeves, PA   15 mL at 11/11/20 0800 ? clonazePAM (KlonoPIN) tablet 0.5 mg  0.5 mg Per J Tube BID Mat Carne, APRN   0.5 mg at 11/11/20 0800 ? cloNIDine HCL (CATAPRES) tablet 0.2 mg  0.2 mg Per G Tube Q6H Madlener, Stephanie, PA   0.2 mg at 11/11/20 0405  Followed by ? cloNIDine HCL (CATAPRES) tablet 0.2 mg  0.2 mg Per G Tube Q8H Madlener, Stephanie, PA      Followed by ? Melene Muller ON 11/13/2020] cloNIDine HCL (CATAPRES) tablet 0.2 mg  0.2 mg Per G Tube Q12H Madlener, Judeth Cornfield, PA      Followed by ? Melene Muller ON 11/16/2020] cloNIDine HCL (CATAPRES) tablet 0.2 mg  0.2 mg Per G Tube Q24H Madlener, Judeth Cornfield, PA     ? famotidine (PEPCID) tablet 20 mg  20 mg Per J Tube Daily [place], Lauren, PA   20 mg at 11/11/20 0800 ? folic acid (FOLVITE) tablet 1 mg  1 mg Per G Tube Daily [place], [place], [place]   1 mg at 11/11/20 0800 ? insulin glargine (Semglee,Lantus) injection 25 Units  25 Units Subcutaneous Q12H Gearldine Shown, [place]   25 Units at 11/11/20 0801 ? miconazole nitrate (SECURA EXTRA THICK ANTIFUNGAL) 2 % cream   Topical (Top) BID Lisette Abu, [place]   Given at 11/11/20 0800 ? midazolam (PF) (VERSED) 1 mg/mL injection          ? polyethylene glycol (MIRALAX) packet 17 g  17 g Per G Tube Daily Madlener, [place], [place]   17 g at 11/11/20 0800 ? Prosource no carb 15 gram protein and 60 kcal  1 packet Per G Tube TID Bettey Costa, PA   1 packet at 11/11/20 0801 ? senna (SENOKOT) tablet 8.6 mg  1 tablet Per G Tube BID Gearldine Shown, PA   8.6 mg at 11/11/20 0800 ? thiamine (VITAMIN B1) tablet 50 mg  50 mg Per G Tube Daily Jennye Boroughs, PA   50 mg at 11/11/20 0800 Continuous Infusions:? dexmedetomidine 0.2 mcg/kg/hr (11/11/20 0800) ? heparin 25,000 units/250 ml infusion - High Risk of Bleeding 14 Units/kg/hr (11/11/20 0800) ? insulin regular human 100 units in sodium chloride 0.9 % 100 mL 1.5 mL/hr at 11/11/20 0800 PRN Meds: acetaminophen, carboxymethylcellulose, Initiate Insulin Infusion Protocol for Adult Patients (Nurse-Driven) **AND** [COMPLETED] insulin regular human **AND** insulin regular human 100 units in sodium chloride 0.9 % 100 mL **AND** POC Glucose (Fingerstick) **AND** Nursing communication **AND** dextrose injection **  AND** dextrose injection, glucagon, haloperidol lactate, HYDROmorphone, LORazepam, oxyCODONE, oxyCODONE, zinc oxideVITALS:Temp (24hrs), Avg:99.6 ?F (37.6 ?C), Min:98.78 ?F (37.1 ?C), Max:100.94 ?F (38.3 ?C)Vitals:  11/11/20 0600 11/11/20 0700 11/11/20 0800 11/11/20 0824 BP: (!) 180/88 (!) 157/67 (!) 156/74  Pulse: (!) 110 (!) 98 (!) 113 (!) 100 Resp: (!) 27 20 (!) 25 19 Temp: 98.78 ?F (37.1 ?C) 99.14 ?F (37.3 ?C) 99.68 ?F (37.6 ?C) 99.68 ?F (37.6 ?C) TempSrc: Bladder  Bladder  SpO2: 100% 95% 97% 99% Weight:     Height:     Intake/Output Summary (Last 24 hours) at 11/11/2020 0918Last data filed at 11/11/2020 0800Gross per 24 hour Intake 3366.3 ml Output 2890 ml Net 476.3 ml Physical ExamAs belowLabs Last 24 hours:Most Recent Result Component Value Date/Time  Sodium 149 (H) 11/11/2020 05:09 AM  Potassium 4.3 11/11/2020 05:09 AM  Chloride 114 (H) 11/11/2020 05:09 AM  CO2 26 11/11/2020 05:09 AM  BUN 83 (H) 11/11/2020 05:09 AM  Creatinine 1.94 (H) 11/11/2020 05:09 AM  WBC 8.9 11/11/2020 05:09 AM  Hemoglobin 7.3 (L) 11/11/2020 05:09 AM  Hematocrit 24.90 (L) 11/11/2020 05:09 AM  Platelets 128 (L) 11/11/2020 05:09 AM  PTT 37.0 (H) 11/11/2020 05:11 AM Imaging Last 24 hours:XR Chest PA or APResult Date: 11/10/2020 Tracheostomy tube terminates 4.2 cm above the carina. Right chest wall chemotherapy port terminates in the right atrium. No interval pneumothorax. Reported And Signed By: Antonieta Loveless, MD  [facility] XR Chest PA or APResult Date: 11/10/2020 Moderate size left pleural effusion with layering. Right upper lobe hazy opacity. Reported And Signed By: Maudie Flakes, MD  [facility] Radiology and Biomedical Imaging Assessment / Plan:Neurologic:  Awake, following commands. Pain is being controlled with the current regimenSedation is being addressed by the current regimen including Precedex drip but at lower dose. Continue to wean . On clonidine taper and AtivanCardiac: Heart rate and blood pressure are in acceptable range.Respiratory: Tracheostomy replaced this am by ENT.Respiratory failure requiring mechanical ventilation. On PS 10 PEEP 5 FiO2 40%Infectious Disease/Sepsis: Afebrile, WBC 8.9Currently not on antibioticsRenal, Fluids/Electrolytes: Appropriate urine output.Acute kidney injury. Creatine stable at 1.9. K 4.3 Hypernatremia. Na increased to 149. Will start D5WMaintain Foley catheter for the strict monitoring of I/O in this critically ill patient.GI/Nutrition: Protein-calorie malnutrition -  On tube feeds.Hematology:Acute blood loss anemia. Hgb 7.3 from 8.1 No acute indication for transfusion.     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 On Heparin drip. Endocrine:  Insulin protocol for hyperglycemiaLantusMusculoskeletal:  PTSkin/wound: Frequent repositioning to mitigate against pressure ulcerationGoals of Care/Advance Directives:Code status:   DNRProphylaxis:  As abovePlan discussed with:  Primary Service, Critical Care Nursing, Respiratory TherapyThis patient is critically ill as indicated by the following Acute Respiratory FailureThe patient has required 33 minutes of my undivided attention during the past 24 hours for one of the previous mentioned life threatening diagnoses. The critical care interventions have included: Management of Acute Respiratory Failure with adjustments to ventilator settings/mode to maintain adequate ventilation / oxygenation  and initiation of spontaneous breathing trials Wendee Copp, MD SICU Attending

## 2020-11-11 NOTE — Progress Notes
[name]	SICU Progress NoteAttending Provider: Alric [name], MDPost-Operative Day/Post Injury Day Procedure(s):2/5: [name] exchange (ENT) Hospital LOS: 33 days Interim History:  HPI: 55 y.o. male admitted to the SICU on 2/9 for ventilator management following an RRT on the oncology floor for desaturation and increased work of breathing while on trach mask and ultimately placed back on vent. ?Patient was initially a transfer from [facility] on 2/4 after trach and PEG when he had a partial obstruction of trach site from a new necrotic mass. He underwent trach exchange 2/5. Was on trach mask on oncology floor however had 2 episodes on floor requiring RRT for lavage and likely mucous plugging. ?Course Complicated by: - Incidental R IJ thrombus - Multiple episodes of presumed mucous plugging requiring RRT's ?PMH: Chronic ITP, Waldenstrom macroglobulinemia, HTN. HLD, DM, tobacco and alcohol use, Laryngeal SCC (weekly Cisplatin and RT)?PSH: None known ?Home meds: Lipitor, Pepcid, Lisinopril, Metformin, Oxycodone, miralax, Insulin U100 and Lantus SICU course: (see progress note from 3/6 for previous days events)3/6: PSV 10/5 as tolerated today, rest on PCV. Discussed hyperkalemia with pharmacy, OK for further lokelma doses but since patient not having BMs, likely not very effective. Will give dose of senna, consider standing bowel regimen tomorrow. Episode of acute agitation/tachycardia to 140s requiring increase in precedex gtt with good effect, minimal secretions. PM K+ 5.5(5.8) --> lokelma 10g. O/N: Lactulose x 1, no BM. K down to 5.0. Rested on PCV.3/7: Attempted PSV 8/5, immediately became tachypneic to mid 40's, HR to 140's; Rested on PCV x 1 hour then returned to PSV 10/5, will continue as tolerated. Clonidine taper started, PRN Ativan increased to 1mg  Q4h, attempting to wean Precedex. Heparin gtt started for known IJ thrombus. ON: Remained on 10/5. 3/8: 10/5 as tolerated. Increased secretions, CXR with L sided consolidation / effusion, though no effusion seen on bedside ultrasound. Low grade temp, plan to reculture if spikes temp or worsening oxygenation. Bicarb tabs d/c'ed, FWF Q4h added. Lantus increased to 25u BID in attempt to wean insulin gtt. 6:30pm: Noted to have trach dislodged, visible distress and desatting to teens; trach successfully replaced, CXR pending, ENT aware. Placed on PCV for rest. ON: CXR with trach in stable position. Placed back on PSV 10/5; tolerating well. Tmax 100.9, afebrile majority of night. 3/9: Concern for trach balloon not holding, trach exchanged by ENT and now appropriately secured. D5W @ 64mL/hr added for worsening hypernatremia. No BM x several days, MoM added. Haldol d/c'ed given no effect and minimal ativan use, continue PRN ativan for now. Review of Allergies/Meds/Hx: I have reviewed the patient's: allergies, past medical history, past surgical history, family history, social history, prior to admission medicationsObjective: Vitals:I have reviewed the patient's current vital signs as documented in the EMRTemp:  [98.78 ?F (37.1 ?C)-100.94 ?F (38.3 ?C)] 98.78 ?F (37.1 ?C)Pulse:  [79-142] 88Resp:  [16-39] 16BP: (91-200)/(59-97) 127/66SpO2:  [96 %-100 %] 100 %SpO2: 100 %Device (Oxygen Therapy): mechanical ventilatorI/O's:I have reviewed the patient's current I&O's as documented in the EMR.Gross Totals (Last 24 hours) at 11/11/2020 0538Last data filed at 11/11/2020 0500Intake 3381.36 ml Output 3100 ml Net 281.36 ml Physical ExamConstitutional: Middle aged trached man in NADHEENT: NCAT. PERRL, EOMI. External ears normal. Bivona trach midline with xeroform distal skinCardiovascular: RRR, no MRG appreciated. Distal pulses are 2+ bilaterally.Pulmonary: Trached on vent. Tachypneac at times. Suctioned for small amount of thin secretions.Abdominal: Soft, obese, non distended, non tender to palpation, +BS. PEG Tube site intactGenitourinary: Foley catheter in place, draining clear yellow urine.Musculoskeletal: BUE strength  4/5, BLE 2/5Neurological: Intermittent opens eyes, nods head, FSC. Agitated at timesSkin: Warm and dry.Psychiatric: Unable to assessLabs: Refer to EMR. I have reviewed the patient's labs within the last 24 hours; see assessment and plan below for significant abnormals.Microbiology:Refer to EMR. I have reviewed all new results within the last 24 hours; see assessment and plan below.Diagnostics:Refer to EMR. I have reviewed all new results within the last 24 hours; see assessment and plan below.Chest ZO:XWRUE to EMR. I have reviewed all new results within the last 24 hours; see assessment and plan below.Abdominal AV:WUJWJ to EMR. I have reviewed all new results within the last 24 hours; see assessment and plan below.ECG/Tele Events: I have reviewed the patient's ECG and telemetry as resulted in the EMR.Assessment/Plan Neurologic: Analgesia- Tylenol 650 Q6 PRN - Oxycodone 10 / 15 Q4 PRN - Dilaudid 1 PRN breakthroughAnxiety / Depression- Precedex gtt, wean as able- Clonidine taper to assist with precedex wean- Clonazepam 0.5mg  BID- Ativan 1mg  IV Q6h PRN- Appreciate palliative care and psych consults?Cardiac: Hx of HTN / HLD- Hold home lisinopril, lasix, crestor; resume when appropriate ?Respiratory:  Respiratory failure 2/2 circumferential tumor invasion of stoma, mucous plugging / trach occlusion- PSV 10/5 as tolerated, rest on PCV as needed- PCV IP 12, PEEP +5- S/p Decadron 8mg  BID for tumor inflammation (2/22 - 2/24) per med oncology- Albuterol Q6hENT: L laryngeal SCC invading tracheostomy; s/p trach exchange (2/5) and biopsy (2/7); trach exchange (3/9)- #8 hyperflex Bivona trach to bypass obstruction; keep spare at bedside - ENT planning for custom bivona trach, ongoing discussions with insurance company for approval - Unfortunately, there are no surgical / curative options- S/p palliative radiation (2/16, 2/17, 2/18, 2/21, 2/22, 2/23)- Appreciate med onc input, no current recommendations, signed off; reconsult when ready for transfer to floor (will go under med onc service) or if needed to assist with GOC discussionsGI: Protein-calorie malnutrition- TF at goal via PEG (Nepro 2/2 hyperkalemia)- FWF as below while on concentrated formula- Bowel regimenPUD prophylaxis- Pepcid while vented?Renal: AKI on CKD (baseline Cr 1.5-1.6), 2/2 supratherapeutic vancomycin level- Creat stable ~2, continue to trend- Foley replaced 2/17 for urinary retention Hypernatremia- FWF Q4h- D5W@50mL /hrHyperkalemia, resolved- Medical management as needed - Nepro TF as aboveInfectious Disease:  MRSA pneumonia, resolved- S/p 10 day course of vanco --> doxy (2/24 - 3/5)- Reculture if febrile / worsening oxygenation given increased suctioning requirements?Hematology:Critical illness anemia- Sanguinous secretions, expected given tumor burden and coagulopathy, may worsen with radiation and baseline hematologic disease- Multivitamins and cofactors- Maintain Hgb >7R IJ thrombus / DVT prophylaxis- High risk heparin gtt - Maintain SCDs at all times?Hx of chronic ITP, Waldenstrom macroglobulinemia- Transfuse platelets if <30k, bleeding, or need for invasive procedure per heme- No need to treat ITP unless plts <30k- Appreciate hematology recommendations?Endocrine: Hyperglycemia / Hx T2DM- Lantus 25 units BID- Insulin gtt (2/28 - )- HOLD home Metformin, Lantus 64u, Apidra 20u TID w meals- Appreciate diabetes team recs, signed off for now, reconsult when appropriateMusculoskeletal: Generalized deconditioning- OOB daily, PT/OT evals- Turn and reposition Q2 hours, PUP dressing?Device Review:- R IJ Port (08/12/20) per guidelines, nonocclusive DVT does not require removal of line- PEG (1/10)- Foley (2/17)- #8 hyperflex Bivona trach (3/9)?Code Status / Dispo:- Transition to DNR (see ACP note 2/25)- Plan for transfer to med onc service when ready for floor (med onc in agreement, have signed off while in SICU; reconsult when ready for floor / if needed for GOC discussions)- Appreciate palliative care assistance with GOC discussionsNotifications : For questions please call SICU Covering Provider  listed in [facility] / EpicSigned:Muhammadali Ries, PA-C3/9/2022SICU [facility] [employer] (secondary)[facility] 7-1 APP			[phone]

## 2020-11-12 ENCOUNTER — Inpatient Hospital Stay: Admit: 2020-11-12 | Payer: PRIVATE HEALTH INSURANCE

## 2020-11-12 DIAGNOSIS — J9601 Acute respiratory failure with hypoxia: Secondary | ICD-10-CM

## 2020-11-12 LAB — CBC WITH AUTO DIFFERENTIAL
BKR WAM ABSOLUTE IMMATURE GRANULOCYTES.: 0.13 x 1000/ÂµL (ref 0.00–0.30)
BKR WAM ABSOLUTE LYMPHOCYTE COUNT.: 0.45 x 1000/ÂµL — ABNORMAL LOW (ref 0.60–3.70)
BKR WAM ABSOLUTE NRBC (2 DEC): 0 x 1000/ÂµL (ref 0.00–1.00)
BKR WAM ANALYZER ANC: 8.5 x 1000/ÂµL — ABNORMAL HIGH (ref 2.00–7.60)
BKR WAM BASOPHIL ABSOLUTE COUNT.: 0.03 x 1000/ÂµL (ref 0.00–1.00)
BKR WAM BASOPHILS: 0.3 % (ref 0.0–1.4)
BKR WAM EOSINOPHIL ABSOLUTE COUNT.: 0.63 x 1000/ÂµL (ref 0.00–1.00)
BKR WAM EOSINOPHILS: 5.8 % — ABNORMAL HIGH (ref 0.0–5.0)
BKR WAM HEMATOCRIT (2 DEC): 25.4 % — ABNORMAL LOW (ref 38.50–50.00)
BKR WAM HEMOGLOBIN: 7.9 g/dL — ABNORMAL LOW (ref 13.2–17.1)
BKR WAM IMMATURE GRANULOCYTES: 1.2 % — ABNORMAL HIGH (ref 0.0–1.0)
BKR WAM LYMPHOCYTES: 4.1 % — ABNORMAL LOW (ref 17.0–50.0)
BKR WAM MCH (PG): 28.3 pg (ref 27.0–33.0)
BKR WAM MCHC: 31.1 g/dL (ref 31.0–36.0)
BKR WAM MCV: 91 fL (ref 80.0–100.0)
BKR WAM MONOCYTE ABSOLUTE COUNT.: 1.17 x 1000/ÂµL — ABNORMAL HIGH (ref 0.00–1.00)
BKR WAM MONOCYTES: 10.7 % (ref 4.0–12.0)
BKR WAM MPV: 12.3 fL — ABNORMAL HIGH (ref 8.0–12.0)
BKR WAM NEUTROPHILS: 77.9 % — ABNORMAL HIGH (ref 39.0–72.0)
BKR WAM NUCLEATED RED BLOOD CELLS: 0 % (ref 0.0–1.0)
BKR WAM PLATELETS: 144 x1000/ÂµL — ABNORMAL LOW (ref 150–420)
BKR WAM RDW-CV: 17.6 % — ABNORMAL HIGH (ref 11.0–15.0)
BKR WAM RED BLOOD CELL COUNT.: 2.79 M/ÂµL — ABNORMAL LOW (ref 4.00–6.00)
BKR WAM WHITE BLOOD CELL COUNT: 10.9 x1000/ÂµL (ref 4.0–11.0)

## 2020-11-12 LAB — BASIC METABOLIC PANEL
BKR ANION GAP: 8 (ref 7–17)
BKR BLOOD UREA NITROGEN: 74 mg/dL — ABNORMAL HIGH (ref 6–20)
BKR BUN / CREAT RATIO: 39.4 — ABNORMAL HIGH (ref 8.0–23.0)
BKR CALCIUM: 9.8 mg/dL (ref 8.8–10.2)
BKR CHLORIDE: 109 mmol/L — ABNORMAL HIGH (ref 98–107)
BKR CO2: 26 mmol/L (ref 20–30)
BKR CREATININE: 1.88 mg/dL — ABNORMAL HIGH (ref 0.40–1.30)
BKR EGFR (AFR AMER): 45 mL/min/{1.73_m2} (ref >60)
BKR EGFR (NON AFRICAN AMERICAN): 37 mL/min/{1.73_m2} (ref >60)
BKR GLUCOSE: 167 mg/dL — ABNORMAL HIGH (ref 70–100)
BKR POTASSIUM: 4.6 mmol/L (ref 3.3–5.3)
BKR SODIUM: 143 mmol/L (ref 136–144)

## 2020-11-12 LAB — PARTIAL THROMBOPLASTIN TIME     (BH GH LMW Q YH)
BKR PARTIAL THROMBOPLASTIN TIME: 50.2 s — ABNORMAL HIGH (ref 23.0–31.4)
BKR PARTIAL THROMBOPLASTIN TIME: 48.9 s — ABNORMAL HIGH (ref 23.0–31.4)

## 2020-11-12 LAB — PHOSPHORUS     (BH GH L LMW YH): BKR PHOSPHORUS: 3.7 mg/dL (ref 2.2–4.5)

## 2020-11-12 LAB — MAGNESIUM: BKR MAGNESIUM: 2.2 mg/dL (ref 1.7–2.4)

## 2020-11-12 MED ORDER — INSULIN GLARGINE 100 UNIT/ML SUBCUTANEOUS SOLUTION
100 unit/mL | Freq: Once | SUBCUTANEOUS | Status: CP
Start: 2020-11-12 — End: ?
  Administered 2020-11-12: 15:00:00 100 mL via SUBCUTANEOUS

## 2020-11-12 MED ORDER — INSULIN GLARGINE 100 UNIT/ML SUBCUTANEOUS SOLUTION
100 unit/mL | Freq: Two times a day (BID) | SUBCUTANEOUS | Status: DC
Start: 2020-11-12 — End: 2020-11-12

## 2020-11-12 MED ORDER — ALBUTEROL SULFATE 2.5 MG/3 ML (0.083 %) SOLUTION FOR NEBULIZATION
2.5 mg /3 mL (0.083 %) | Freq: Four times a day (QID) | RESPIRATORY_TRACT | Status: DC | PRN
Start: 2020-11-12 — End: 2020-11-23

## 2020-11-12 MED ORDER — BISACODYL 10 MG RECTAL SUPPOSITORY
10 mg | Freq: Every day | RECTAL | Status: DC | PRN
Start: 2020-11-12 — End: 2020-11-23
  Administered 2020-11-13: 15:00:00 10 mg via RECTAL

## 2020-11-12 MED ORDER — POLYETHYLENE GLYCOL 3350 17 GRAM ORAL POWDER PACKET
17 gram | Freq: Two times a day (BID) | GASTROSTOMY | Status: DC
Start: 2020-11-12 — End: 2020-11-23
  Administered 2020-11-13 – 2020-11-22 (×17): 17 gram via GASTROSTOMY

## 2020-11-12 MED ORDER — INSULIN GLARGINE 100 UNIT/ML SUBCUTANEOUS SOLUTION
100 unit/mL | Freq: Two times a day (BID) | SUBCUTANEOUS | Status: DC
Start: 2020-11-12 — End: 2020-11-17
  Administered 2020-11-13 – 2020-11-17 (×10): 100 mL via SUBCUTANEOUS

## 2020-11-12 MED ORDER — ROSUVASTATIN 10 MG TABLET
10 mg | Freq: Every day | GASTROSTOMY | Status: DC
Start: 2020-11-12 — End: 2020-11-17
  Administered 2020-11-12 – 2020-11-17 (×6): 10 mg via GASTROSTOMY

## 2020-11-12 NOTE — Transfer Summaries
[facility]		SICU Transfer NoteHistory & SICU Course:HPI:?55 y.o.?male?admitted to the SICU on 2/9?for?ventilator management?following?an RRT on the oncology floor for desaturation and increased work of breathing while on trach mask and ultimately placed back on vent. ?Patient was initially a transfer from [facility] on 2/4 after trach and PEG when he had a partial obstruction of trach site from a new necrotic mass. He underwent trach exchange 2/5.?Was on trach mask on oncology floor however had 2 episodes on floor requiring RRT for lavage and likely mucous plugging.??Prior hospital course c/b- Incidental R IJ thrombus - Multiple episodes of presumed mucous plugging requiring RRT's?SICU course c/b- Severe, refractory  anxiety requiring continuous sedation while on the ventilator- Multiple ventilator- associated pneumonias- Trach dislodgement now s/p exchange by ENT (#8 hyperflex Bivona trach)- Septic shock from VAP above- Diarrhea (c. Dif negative)- AKI s/p medical management alone, no RRT needs- Hyperglycemia requiring insulin infusion- Ongoing non-operative malignancy s/p XRTPMH:?Chronic ITP, Waldenstrom macroglobulinemia, HTN. HLD, DM, tobacco and alcohol use, Laryngeal SCC (weekly Cisplatin and RT)?PSH:?None known??Home meds:?Lipitor, Pepcid, Lisinopril, Metformin, Oxycodone, miralax, Insulin U100 and Lantus??SICU course by date: 2/9: Arrives to SICU?HDS, on vent PCV. Appears well.?Transitioned to Pressure support on vent and tolerating well.?O/N: Episode of patient feeling like not getting a full breath, extremely tachypenic and increased work of breathing and tachycardia, attempted suctioning and lavage without improvement, attempted to switch to PCV and VAC but due to dyssynchrony unable to tolerate, no desat but poor ventilation and TV and ETCO2 up to 100. ENT at bedside do not feel additional scope would benefit as he was just scoped a few hours ago, ultimately gave fentanyl push and started propofol for vent synchrony but despite max prop still dyssynchronous. Gave one dose of roccuronium once sedated for vent synchrony with good effect. Albuterol and HTS neb given and peaks improved from 50 to 30's. No further paralytic needed and now synchronous on VAC. Kept on propofol for vent synchrony. CXR reassuring and back to GCS 11T. Hypotension only following sedation and Rocc, Low dose Neo started. ?2/10: Kept on higher sedation in AM for Fourche A/P (for staging) and soft tissue neck. Weaning sedation and will plan for PSV trial once more awake. Scoped by ENT in AM, airway patent. Q1-2hr suction requirements of thick tan secretions. No further episodes of respiratory distress although sedation remains high. Tube feeds changed to continuous given significant hyperglycemia. Insulin regimen changed per diabetes team recommendations. Pressure ulceration noted around tracheostomy --> ENT at bedside to evaluation, likely some component of invasive tumor growth. Palliative care consult for assist with symptom management and family moving forward. Primary oncologist office made aware of referral. O/N: weaning down on Prop, no episodes of respiratory distress or desats overnight. Febrile to 101.3, sputum and blood cultures ordered. 500cc LR bolus given for some oliguria and rising Cr. ?2/11: Given 500cc NS for oliguria with good response. Vanco level yesterday 25, likely cause of worsening aki, on hold. UOP & lytes remains stable. Insulin adjusted per diabetes team. PSV as tolerated (doing well). CPT added. Confirmed with ENT pulm recs for blue portex suctionaid trach- no plans to switch at this time. Bowel regimen added. Plan keep on neo if low dose but if inc, consider changing to levo as likely then not in setting of sedation. Family updated on phone. Confirmed w heme- no AC rec for R IJ thrombus until plt > 50. Was going to attempt to place pt on TM overnight as he did very well on PSV x 3hrs,  however, pt had anxiety attack regarding his cancer and prognosis, he requested to sleep during night and was agreeable to TM in am. O/N: Patient agrees to PSV overnight which he did very well on. Agreeable to getting OOB to chair and possibly trach mask in AM but wants to stay on vent tonight. Off Neo by AM.  2/12: trial trach mask as tolerated this AM. Adjusted insulin regimen per the diabetes management service. Sputum now with staph, awaiting sensitivities. More loose stools today; nursing placed rectal tube and held bowel regimen.  ON: Anxious at times, feeling like he cannot breath but doing well on TM. Frequent suctioning. Increased standing. FIO2 increased to 40%.  insulin to 14U per endo reccs. ?2/13: continue to adjust insulin regimen, now 20u regular q6. Cr continues to uptrend 2.57 --> 2.63 and repeat vanco level still elevated 24.6; pharmacy to continue dosing adjustments. Otherwise, remains on trach mask. Sputum with MRSA --> d/c ceftriaxone. ON: increased standing insulin to 24U. ?2/14: Will discuss [facility] plan with hematology now that PLT > 50 x2 checks. Will also d/c vancomycin today (10d of therapy thus far). AKI appears to have peaked at 2.57 -> 2.63 -> 2.47. Awaiting transfer to the oncology service on NP 15. Okay to start [facility], but will hold off today given more bloody secretions. ?2/15: Plan to start palliative RT today. AM session aborted due to extreme anxiety (despite premedication with IV ativan), tried again in the PM with 10mg  IV Valium + 3mg  IV ativan, procedure again aborted due to extreme anxiety and inability to tolerate. Pending discussion with rad onc in AM re: utility of ongoing attempts at RT. Pending transfer to NP15 on med onc service.  O/N: increasingly anxious / restless, but able to re-direct and calm down with PRN ativan. Liquid stools, placed rectal tube. Will start banatrol and check CBC in AM, check c dif. This had previously improved by holding bowel regimen.?2/16: Acutely increased WOB, HR 130s, SBP 170s --> ENT bedside scope with trach patent, though notable secretions --> suctioned with minimal improvement; placed back on ventilator PSV 10 / +5 with immediate improvement in VS and respiratory distress. Precedex gtt started to assist more frequent suctioning. CXR with LLL consolidation --> resend sputum, and restart Vanco (close trough monitoring with AKI) given known MRSA PNA. Increased diarrhea, cdiff negative, transition to Vital TF. LR 500cc for hemoconcentration of labs, tachycardia. Appreciate radiation oncology; sedated patient with Propofol gtt, bolus x1 with good effect --> able to tolerate 15 minute radiation session; plan to repeat Thursday, Friday, Monday, and Tuesday. ?2/17: Psych consulted for depression--> Doxepin added, Klonopin added. Hypernatremia--> D5W and free water flushes added. Ceftaz added for GNR in sputum. Plan for family discussion this PM (see separate note) and radiation this PM. Foley replaced for retention. ON: Tolerated PSV 15/5. Repeat Na 148.?2/18: Plan for radiation this afternoon. Rectal tube removed.. Hgb 6.3--> transfused 1un PRBCs. Discussed care with sister and niece at bedside, remain full code and plan to continue radiation course through Tuesday (see family communication note).?2/19: PSV weaned 15 --> 12/+5. Start Clonidine and discontinue Precedex gtt. Discontinue Gabapentin and Dilaudid IV, decrease Oxycodone to 2.5 / 5. Na improved 144, discontinue D5W; sugars should improve, maintain current regimen per DM team. GNR per micro lab not pseudomonas, klebsiella, or other enteric organism; would have speciated --> change Ceftaz to PO Cipro, to complete course 2/23 for one week. Cr again increased with Vanco level over 20 --> next dose held, monitor level closely.  Hgb remains low despite 1U yesterday, 6.3 --> 7.0; start multivitamins and check with Heme. ON: Low grade fevers stable. Given ativan 0.5mg  in place of klonipin with good anxiolytic effect. Transfuse 1u PRBC (likely half unit) for Hgb 6.7.?2/20: Acute respiratory distress this morning after turn, minimal secretions, did not improve with increased pressure or VAC --> bagged x5 mins with improvement, placed on PCV, IP 20 --> CXR pending, start Propofol, add back Ativan and Morphine PRN. Appreciate heme assistance with persistent anemia. Cr continues to rise following elevated vanco trough; hold any further doses, level will likely remain therapeutic to final date of course (2/22) --> unfortunately, patient did receive additional dose of Vanco 2g per pharmacy order this morning; discontinued placeholder, consider Doxycycline if Vanco level subtherapeutic prior to end date.ON: Remains on PCV. Remained on low dose levo 0.02 throughout night. Manual suctioning by RT overnight with thin tan secretions. AM labs notable for Hgb 9.1(6.7) after 1uPRBC transfusion yesterday. Worsening AKI Cr 2.51 (2.44).?2/21: Patient alert and breathing comfortably this morning --> wean PEEP back to 5, consider retrial of PSV today pending radiation plans. Free water flushes discontinued, hypernatremia resolved. Worsening AKI from Vanco; since discontinued, monitor UOP. LR 250cc, wean off Levo gtt. Appreciate palliative care assistance after event yesterday, patient requesting further discussion. Lantus changed to 15U QHS, 10U QAM and bolus increased to 28U Q6.ON: Acute respiratory distress at change of shift, lost tidal volumes and desaturated to high 80s requiring bagging, recovered. Pt expressing that he cannot breathe, feels obstructed --> suctioned with 10Fr suction w/ resistance met at 20cm and minimal secretions. ENT at bedside for scope --> thick secretions noted but airway patent.. Noted that Bivona trach was retracted, advanced to proper position by ENT. Per ENT, tracheal tissue proximal to bivona tip very flimsy. Likely tissue collapsing against distal tip with trach retracted in poor position causing obstruction. Patient acutely distressed and agitated during event, propofol gtt restarted will plan to wean off tonight. Start decadron 8mg  q12 per onc recs for peritumoral inflammation. Hyperkalemic in AM to 5.7 --> EKG unchanged, insulin/dextrose/Ca2+ x 1. Transfuse 1u PRBC for hgb 6.9. ?2/22: Keep on PCV today and continue propofol gtt. Start Insulin gtt and increase Lantus to 20u BID. No bowel movements --> discontinue banatrol. Plan for radiation today. Discussed transfer to MICU no beds available, will initiate transfer once a bed becomes available. Plan for multidisciplinary family meeting this week. ON: Rested on PCV, low dose prop. WBC count 12 (7) this am?2/23: Plan for last palliative radiation session later today. RR decreased to 12. Given rising white count and increased in line suctioning requirement Sputum culture sent and CXR obtained. Will stop Cipro and start ceftriaxone. 1L LR bolus given for AKI, rising BUN as well as likely concentration on CBC. Plan to start weaning off prop after radiation and attempt PSV trials again. Family discussion with all teams Thursday or Friday. CPT added back given increased secretions attempted to add back HTS nebs however hospital is completely out per pharmacy discussion. Respiratory culture again growing GPC and GNR. ON: BG stable on ins gtt. NAE?2/24:?This AM, less responsive (GCS 10T) and BP down to 60/40s, but also worsening vent synchrony --> increased oxycodone and dilaudid followed by 1000 LR bolus with good effect and pan-cx given worsening leukocytosis. Started vanc / zosyn. ?Later, self d/c bivona trach and desat to 60s; SICU attending at bedside and replaced bivona trach without difficulty and placed back on the ventilator. ?ENT at bedside to scope and  confirmed trach positioning. Will plan for family meeting tomorrow. ON: NAE?2/25: GCS 11T, but still withdrawn / lethargic. Will d/c PRN ativan to minimize sedation and continue to wean propofol as tolerated, though patient has been mentating on higher doses than this. Still low dose levo requiremements. Bivona trach dislodged ~4cm today, obturator placed and re-advanced without issue. Trial PSV 15/5, tolerating with Tv 400s. D/w ENT, will try to work on custom trach for him. Plan for family meeting this afternoon for goals of care discussions. Rested on [facility] settings after ~6 hours of 15/5 (work of breathing + tachypnea 40s). Decreased lantus to 15 BID per endocrine. In afternoon, became more agitated / tachy requiring additional PRN 1mg  ativan + 1mg  versed + dilaudid. See ACP note for family meeting discussion. O/N: NAE. ?2/26: Attempt to wean propofol to 35, increased agitation and tachypnea --> increase Prop back to 40 and oxycodone given with improvement. Plan for PSV 15/5 x 4 hours today as tolerated. Increased Lantus to 20u BID. Discontinued Zosyn. Discussed with pharmacy switching Vanco to Clinda given worsening renal function --> recommend continuing Vanco as high risk of C.diff with Clinda, will recheck random vanco level tomorrow @ 0500. 1L NS given for rise in creatinine. O/N: Vanc trough 21.4. ?2/27: Plan for PSV 15/5 x 6 hours as tolerated. 1L LR given for high insensible losses. Change Vanco to PO Doxy. Increase Lantus to 30u BID, d/c insulin gtt and start HDISS.  O/N: K 5.7 --> insulin/dextrose/Ca given. ?2/28: Remains on Levo. K 5.7--> 5.9--> Insulin/ Dextrose/ Ca x given x2 and Lokelma x1. Hyperglycemic--> Lantus 30 daily, HDISS, Regular Insulin 9un Q6hours. Decreased IP to 18, tolerating with TV 400s-500s. Heme consulted for ongoing thrombocytopenia, low concern for HIT, no plan to treat ITP unless plt<30k or bleeding.  O/N: At shift change insulin Gtt restarted, Significant stool output after lokelma, K improving to 5.4, will trend. AM labs with Plt 35, HSQ held following heme reccs. ?3/1: [name] noted to be dislodged, re-advanced and ENT consulted with no acute interventions. CXR showing trach in stable position. Decreased IP to 15. Will trial on PSV 15/5 x6hours BID. Afternoon plt stable at 34; will continue to hold HSQ. K stably elevated at 5.5; no acute intervention given insulin gtt and large rectal tube output. Levo weaned off. O/N: Rested on Pressure control after 6 hours PSV. NAE. Repeat plt stable at 34. ?3/2: Weaning prop and transitioning to dex to decrease sedation and aid in anxiety/agitation management. Will trial on PSV 10/5 x6hr BID today, resting on PCV. Psych re-engaged due to withdrawn affect/mood this AM; d/ced doxepin to avoid further delirium/AMS. Acutely tachycardic, tachypneaic, and hypoxic w/ O2 sat in 80's on PSV trial --> prop gtt increased and placed back on PCV with IP 12. Weaned off prop, on dex gtt. ON: Rested on PCV. K 6.1 --> 1g Ca, insulin and dextrose given, EKG obtained.?3/3: Potassium 6.0 --> insulin/dextrose, Lokelma 10g x 1 given. Held off on further calcium as serum calcium 10.3 and corrected Calcium is ~11.6. Bicarb 19 --> ordered Na Bicarb tabs 650mg  TID. 500cc NS x 1  Given for goal even to +500. Tube feeds changed to Nepro. Trial of PSV 5/5 --> last ~ then became tachypneic and put back on 10/5. Tolerated 10/5 x 6 hours, rested on PCV. Repeat potassium 5.8 --> 5u insulin/dextrose. Will plan for additional dose of Lokelma at 6pm. Placed on second 110.5 PSV trial ~6pm, plan for 6hrs as tolerated. O/N: Completed PSV trial.  Repeat K - 6 --> insulin/dextrose and lokelma given. ?3/4: Patient repositioned with Trach pulled out halfway, replaced without difficulty. CXR post trach in good position. 1st PSV 10/5 trial for 2.5 hours, stopped due to tachypnea. Plan for 2nd trial for goal 6 hours. 4pm K 5.6. O/N: Tolerated second PSV trial, placed on resting settings at 21:40.  K 5.8, lokelma and calcium gluconate given. ?3/5: Tolerated PSV trial well. Plan to continue with PSV 10/5 q6 BID today. K 5.8 on PM labs --> additional lokelma given, held on Ca2+ given serum level at ULN. Reviewed med list with pharmacy, no clear pharmacologic cause of hyperkalemia. Will plan to check again at [place]. Q1 suctioning of thin tan secretions. O/N: Repeat K 5.7, lokelma given.?3/6: PSV 10/5 as tolerated today, rest on PCV. Discussed hyperkalemia with pharmacy, OK for further lokelma doses but since patient not having BMs, likely not very effective. Will give dose of senna, consider standing bowel regimen tomorrow. Episode of acute agitation/tachycardia to 140s requiring increase in precedex gtt with good effect, minimal secretions. PM K+ 5.5(5.8) --> lokelma 10g. ?3/7: Attempted PSV 8/5, immediately became tachypneic to mid 40's, HR to 140's; Rested on PCV x 1 hour then returned to PSV 10/5, will continue as tolerated. Clonidine taper started, PRN Ativan increased to 1mg  Q4h, attempting to wean Precedex. Heparin gtt started for known IJ thrombus. ON: Remained on 10/5. ?3/8: 10/5 as tolerated. Increased secretions, CXR with L sided consolidation / effusion, though no effusion seen on bedside ultrasound. Low grade temp, plan to reculture if spikes temp or worsening oxygenation. Bicarb tabs d/c'ed, FWF Q4h added. Lantus increased to 25u BID in attempt to wean insulin gtt. 6:30pm: Noted to have trach dislodged, visible distress and desatting to teens; trach successfully replaced, CXR pending, ENT aware. Placed on PCV for rest. ON: CXR with trach in stable position. Placed back on PSV 10/5; tolerating well. Tmax 100.9, afebrile majority of night. ?3/9: Concern for trach balloon not holding, trach exchanged by ENT and now appropriately secured. D5W @ 88mL/hr added for worsening hypernatremia. No BM x several days, MoM added. Haldol d/c'ed given no effect and minimal ativan use, continue PRN ativan for now. ON: Required additional 0.5 ativan x1 for acute agitation resulting in tachycardia and desat on PSV. ?3/10: Intermittently anxious, agitated, but maintaining sPO2. Will trial PSV 9/5 later today. Relatively stable 24hr insulin requirements over last few days --> increase lantus to 40 BID with plan to transition off insulin gtt tomorrow. HyperNa improved, will stop D5W today. Give dulcolax suppository and trial d/c foley today.Assessment and plan:Neurologic: Analgesia-?Tylenol 650 Q6 PRN - Oxycodone 10 / 15 Q4 PRN - Dilaudid 1 PRN breakthrough?Anxiety / Depression- Clonidine taper given prolonged precedex used- Clonazepam 0.5mg  BID- Ativan 1mg  IV Q6h PRN; could consider increasing to q4 PRN- Appreciate palliative care and psych consults?Cardiac: Hx of?HTN / HLD- Hold home lisinopril, lasix; resume when appropriate - Home statin?Respiratory:??Respiratory failure 2/2 circumferential tumor invasion of stoma, mucous plugging / trach occlusion- PSV 9 / 5 as tolerated, continue to adjust Pi as tolerated- Rest on PCV IP 12, +5 PRN- S/p Decadron 8mg  BID for tumor inflammation (2/22 - 2/24) per med oncology- Albuterol Q6h PRN?ENT:?L laryngeal SCC invading tracheostomy; s/p trach exchange (2/5) and biopsy (2/7); trach exchange (3/9)- #8 hyperflex Bivona trach to bypass obstruction; keep spare at bedside - ENT planning for custom bivona trach, ongoing discussions with insurance company for approval - Unfortunately, there are no surgical / curative options- S/p palliative radiation (  2/16, 2/17, 2/18, 2/21, 2/22, 2/23)- Appreciate med onc input, no current recommendations, signed off; reconsult when ready for transfer to floor (will go under med onc service) or if needed to assist with GOC discussions?GI: Protein-calorie malnutrition- TF at goal via PEG (Nepro 2/2 hyperkalemia)- FWF as below while on concentrated formula- Bowel regimen?Constipation (likely opioid induced)- Bowel regimen- Try dulcolax suppository PUD prophylaxis- Pepcid while on the ventilator?Renal:?AKI on CKD (baseline Cr 1.5-1.6), 2/2 supratherapeutic vancomycin level-?Creat stable ~2, continue to trend- Foley replaced 2/17 for urinary retention, but removed again on 3/10 and now voiding sponteanously Hypernatremia- FWF Q4h?Hyperkalemia, resolved- Medical management as needed - Nepro TF as above?Infectious Disease:? MRSA pneumonia, resolved- S/p 10 day course of vanco --> doxy (2/24 - 3/5)- Reculture if febrile / worsening oxygenation given increased suctioning requirements?Hematology:Critical illness anemia- Sanguinous secretions, expected given tumor burden and coagulopathy, may worsen with radiation and baseline hematologic disease- Multivitamins and cofactors- Maintain Hgb >7?R IJ thrombus / DVT prophylaxis- High risk heparin gtt - Maintain SCDs at all times?Hx of?chronic ITP, Waldenstrom macroglobulinemia- Transfuse platelets if <30k, bleeding, or need for invasive procedure per heme- No need to treat ITP unless plts <30k- Appreciate hematology recommendations?Endocrine: Hyperglycemia / Hx T2DM- Increase lantus 40 units to BID- Insulin gtt (2/28 - ) ; plan to transition to insulin sliding scale coverage after PM lantus dose- HOLD home Metformin, Lantus 64u, Apidra 20u TID w meals- Appreciate diabetes team recs, signed off for now, reconsult when appropriate?Musculoskeletal: Generalized deconditioning- OOB daily, PT/OT evals- Turn and reposition Q2 hours, PUP dressing?Device Review:- R IJ Port (08/12/20) per guidelines, nonocclusive DVT does not require removal of line- PEG (1/10)- Foley (2/17)- #8 hyperflex Bivona trach (3/9)?Code Status / Dispo:- Transition to DNR (see ACP note 2/25)- Plan for transfer to med onc service when ready for floor (med onc in agreement, have signed off while in SICU; reconsult when ready for floor / if needed for GOC discussions)- Accepted for 10-7 bed- Appreciate palliative care assistance with GOC discussionsIVF:  NoneNutrition:  Diet Tube Feed No TrayLines/Date:  As aboveActivity:  As toleratedProphylaxis:  H2RB, heparin infusion (high risk)Vitals:  Temp:  [99.68 ?F (37.6 ?C)-101.12 ?F (38.4 ?C)] 99.68 ?F (37.6 ?C)Pulse:  [92-130] 98Resp:  [17-37] 27BP: (98-199)/(51-93) 135/64SpO2:  [91 %-100 %] 100 %Device (Oxygen Therapy): mechanical ventilator_____________________________________________________________For questions please call SICU Covering Provider in Epic EMR (primary) SICU [facility] Dynamic Roles (secondary)YSC 7-1 APP			[phone] 6-1 Frontside		[phone] 6-1 Backside		[phone] Verdi-2 APP			[phone]:Halley Kincer, PA-CSICU APPMHB [phone]/06/2021

## 2020-11-12 NOTE — Progress Notes
Otolaryngology Progress NoteDate: 11/12/2020 Interim History- trach change by ENT bedside yesterday- pending custom trachVitalsTemp:  [99.68 ?F (37.6 ?C)-101.12 ?F (38.4 ?C)] 100.22 ?F (37.9 ?C)Pulse:  [92-142] 98Resp:  [16-41] 17BP: (98-205)/(54-112) 125/66SpO2:  [91 %-100 %] 99 %Device (Oxygen Therapy): mechanical ventilator I/O last 3 completed shifts:In: 4031.2 [I.V.:1271.2; NG/GT:2760]Out: 3380 [Urine:3380]Physical ExamGen: alert, vented PCEyes: open, normal external appearanceFace: grossly symmetricNose: clear anteriorlyOP/OC: hemostaticNeck: Bivona 8 cuffed secured w/soft trach ties, necrotic but hemostatic mass surrounding the stoma, remaining neck flat, firm tumor Pulm: no increased WOB on TMDiagnostic DataReviewed, per chartAssessment & Plan54 M with PMH of HTN, HLD, DM, tobacco and EtOH use, Waldenstrom macroglobulinemia, 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 Admitted to SICU for ventilatory support. No acute ENT intervention planned; no curative or meaningful surgical options at this time. Course complicated by multiple episodes of Bivona trach occlusion/obstruction.  - custom Bivona trach ordered- no ENT intervention planned at this time; case discussed at tumor board 2/14 and is not a surgical candidate - disease management/goals-of-care best pursued via non-surgical means; eg, Medical Oncology, Radiation Oncology, Palliative Care- transfer to Medical Oncology floor when eligible - continue routine trach care with careful suctioning- ensure trach well secured with tiesElectronically Signed ZO:XWRUE Genny Caulder DDSContact: [id number]/10/20227:[id number] AM

## 2020-11-12 NOTE — Plan of Care
[facility]		Location: 21 SICU/7112-A55 y.o., male			Attending: Alric Quan, MD	Admit Date: 10/09/2020		[id number] LOS: 34 days FOLLOW UP NUTRITION ASSESSMENT DIET ORDER: Tube Feed With Tray --> Nepro @ 65 mL/hr with ProSource TIDANTHROPOMETRICS:Height: 74Admit wt: 136.2 kg, BMI: 38.6 kg/m^2Current wt: 144.2 kg, BMI: 40.8 kg/m^2Dosing wt (IBW): 95 kgAdditional wt information:Ht confirmed with pt: yes. Based on EMR wt hx, pt down 8.4 kg (5.8%) over the past three months, not considered significant for time frame. Fluctuations in wt may be r/t fluid status vs scale variance. Overall upward trend during admission. Per flowsheets, 3+ (moderate) edema present. Will continue to follow. Wt hx with the lowest wt from each available month over the past year: 09/22/20: 137.8 kg12/01/21: 143.2 kgESTIMATED NUTRITION REQUIREMENTS:Kcal/day: 2850	(30 kcal/kg)Protein/day: 162	(1.7 gm/kg) Fluids/day: Fluid management per medical team discretion. ~2580 mL (30 mL/kcal) Needs based on: dosing wt (95 kg), ICU, obesity, CA w/mets, AKI/CKD, PNA, s/p shockNUTRITION ASSESSMENT: Chart reviewed for f/u. Pt seen trached to vent, on CPAP. TF infusing @ 65 mL/hr (goal rate). Pt has received 99% of goal enteral volume and 95% of protein modular since last assessment. Continue renal formula as ordered. Accu checks have ranged from 134-180 mg/dL. Rectal tube removed on 3/6. Bowel regimen on board. Nutrition will continue to follow throughout admission. Cultural/Religious/Ethnic Needs: NoFood Allergies: None per pt NUTRITION DIAGNOSIS:Inadequate oral intake related to medical status as evidenced by NPO. --- continues INTERVENTIONS/RECOMMENDATIONS: Meals and Snacks:- Diet advancement per team/SLP --> currently NPOGoal: Patient will have diet advanced or remain on nutrition support prior to next nutrition assessment. --- met, continuesEnteral Nutrition:- Nepro @ 65 mL/hr x 24 hrs - Bolus 1 ProSource TID- Total provides: 2988 kcal, 171 gm protein, 251 gm CHO (10.5 gm/hr), and 1134 mL free H2O- Additional free water boluses per team Goal: Patient will receive >90% of goal volume TF prior to next nutrition assessment. --- met, continuesDischarge Planning and Transfer of Nutrition Care:  Nutrition related discharge needs still being determined at this time, will continue to follow.MONITORING / EVALUATION:Food/Nutrition-Related History:Food and Nutrient IntakeEnteral Nutrition IntakeAnthropometric Measurements:Weight Biochemical Data, Medical Tests and Procedures:Electrolyte and renal profileGlucose/endocrine profileNutrition-Focus Physical FindingsOverall findingsElectronically signed by Suzie Portela, MS, RD, CNSC, CDN on March 10, 2022MHB: [phone] note: Nutrition is a consult only service on weekends and holidays.  Please enter a consult in EPIC if assistance is needed on a weekend or holiday or via M[facility] [phone] to contact the covering RD.

## 2020-11-12 NOTE — Progress Notes
Surgical Intensive Care Unit  AttendingI have personally performed a face to face diagnostic evaluation on this patient, reviewed the chart, available data and care plan and have personally formulated the plan outlined below. I have seen this patient in addition to the Surgical ICU advanced practitioner  given patient's complexity and associated need for critical care. 55 y.o. 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 Hospital Day 34 Past Medical History: Diagnosis Date ? Diabetes mellitus (HC Code) (HC CODE)  ? Hypercholesteremia  ? Hypertension  No past surgical history on file.Scheduled Meds:Current Facility-Administered Medications Medication Dose Route Frequency Provider Last Rate Last Admin ? albuterol neb sol 2.5 mg/3 mL (0.083%) (PROVENTIL,VENTOLIN)  2.5 mg Nebulization Q6H [place], [place], [place]   2.5 mg at 11/12/20 5284 ? ascorbic acid (vitamin C) (VITAMIN C) tablet 250 mg  250 mg Per G Tube Daily Jennye Boroughs, [place]   250 mg at 11/12/20 1324 ? chlorhexidine gluconate (PERIDEX) 0.12 % solution 15 mL  15 mL Mouth/Throat Q12H Leonia Reeves, PA   15 mL at 11/12/20 4010 ? clonazePAM (KlonoPIN) tablet 0.5 mg  0.5 mg Per J Tube BID Mat Carne, APRN   0.5 mg at 11/12/20 2725 ? cloNIDine HCL (CATAPRES) tablet 0.2 mg  0.2 mg Per G Tube Q8H Madlener, Stephanie, PA   0.2 mg at 11/12/20 0410  Followed by ? Melene Muller ON 11/13/2020] cloNIDine HCL (CATAPRES) tablet 0.2 mg  0.2 mg Per G Tube Q12H Madlener, Judeth Cornfield, PA      Followed by ? Melene Muller ON 11/16/2020] cloNIDine HCL (CATAPRES) tablet 0.2 mg  0.2 mg Per G Tube Q24H Madlener, Judeth Cornfield, PA     ? famotidine (PEPCID) tablet 20 mg  20 mg Per J Tube Daily Cusick, Lauren, PA   20 mg at 11/12/20 3664 ? folic acid (FOLVITE) tablet 1 mg  1 mg Per G Tube Daily [place], [place]   1 mg at 11/12/20 4034 ? insulin glargine (Semglee,Lantus) injection 25 Units  25 Units Subcutaneous Q12H Gearldine Shown, [place]   25 Units at 11/12/20 0820 ? magnesium hydroxide (MILK OF MAGNESIA) 400 mg/5 mL suspension 30 mL  30 mL Per G Tube Daily Alyson Reedy, PA   30 mL at 11/12/20 7425 ? miconazole nitrate (SECURA EXTRA THICK ANTIFUNGAL) 2 % cream   Topical (Top) BID Lisette Abu, [place]   Given at 11/12/20 9563 ? midazolam (PF) (VERSED) 1 mg/mL injection          ? polyethylene glycol (MIRALAX) packet 17 g  17 g Per G Tube Daily Madlener, [place], PA   17 g at 11/12/20 8756 ? Prosource no carb 15 gram protein and 60 kcal  1 packet Per G Tube TID Bettey Costa, PA   1 packet at 11/12/20 4332 ? senna (SENOKOT) tablet 8.6 mg  1 tablet Per G Tube BID Gearldine Shown, PA   8.6 mg at 11/12/20 9518 ? thiamine (VITAMIN B1) tablet 50 mg  50 mg Per G Tube Daily Jennye Boroughs, PA   50 mg at 11/12/20 0821 Continuous Infusions:? dextrose 5 % 50 mL/hr (11/12/20 0700) ? heparin 25,000 units/250 ml infusion - High Risk of Bleeding 16 Units/kg/hr (11/12/20 0700) ? insulin regular human 100 units in sodium chloride 0.9 % 100 mL 3 mL/hr at 11/12/20 0700 PRN Meds: acetaminophen, carboxymethylcellulose, Initiate Insulin Infusion Protocol for Adult Patients (Nurse-Driven) **AND** [COMPLETED] insulin regular human **AND** insulin regular human 100 units in sodium chloride 0.9 % 100 mL **AND** POC Glucose (  Fingerstick) **AND** Nursing communication **AND** dextrose injection **AND** dextrose injection, glucagon, HYDROmorphone, LORazepam, oxyCODONE, oxyCODONE, zinc oxideVITALS:Temp (24hrs), Avg:100.5 ?F (38.1 ?C), Min:99.68 ?F (37.6 ?C), Max:101.12 ?F (38.4 ?C)Vitals:  11/12/20 0600 11/12/20 0700 11/12/20 0800 11/12/20 0813 BP: 125/66 (!) 101/51 (!) 199/89 (!) 172/89 Pulse: (!) 98 (!) 94 (!) 115 (!) 123 Resp: 17 (!) 21 (!) 21 (!) 27 Temp: (!) 100.22 ?F (37.9 ?C) (!) 100.04 ?F (37.8 ?C) (!) 100.04 ?F (37.8 ?C)  TempSrc: Bladder Bladder   SpO2: 99% 98% 99% Weight:     Height:     Intake/Output Summary (Last 24 hours) at 11/12/2020 0908Last data filed at 11/12/2020 0700Gross per 24 hour Intake 4029.39 ml Output 3055 ml Net 974.39 ml Physical ExamAs belowLabs Last 24 hours:Most Recent Result Component Value Date/Time  Sodium 143 11/12/2020 05:36 AM  Potassium 4.6 11/12/2020 05:36 AM  Chloride 109 (H) 11/12/2020 05:36 AM  CO2 26 11/12/2020 05:36 AM  BUN 74 (H) 11/12/2020 05:36 AM  Creatinine 1.88 (H) 11/12/2020 05:36 AM  WBC 10.9 11/12/2020 05:36 AM  Hemoglobin 7.9 (L) 11/12/2020 05:36 AM  Hematocrit 25.40 (L) 11/12/2020 05:36 AM  Platelets 144 (L) 11/12/2020 05:36 AM  PTT 48.9 (H) 11/12/2020 05:36 AM Imaging Last 24 hours:XR Chest PA or APResult Date: 11/11/2020 Tracheostomy tube 3.6 cm above carina. Improving small left pleural effusion. Reported And Signed By: Maudie Flakes, MD  [facility] Assessment / Plan:Neurologic:  Awake, following simple commands. Anxious, restless at times.Pain is being controlled with the current regimenOn clonidine taper and AtivanCardiac: Heart rate (regular) and blood pressure are in acceptable range.Respiratory: Tracheostomy replaced 3/9 by ENT.Respiratory failure requiring mechanical ventilation. On PS 10 PEEP 5 FiO2 40%Infectious Disease/Sepsis: Afebrile, WBC10.9Currently not on antibioticsRenal, Fluids/Electrolytes: Appropriate urine output.Acute kidney injury. Creatine stable  K stable 4.6 Hypernatremia resolved. Na improved to 143.  Will stop D5WWill try to d/c Foley catheter. GI/Nutrition: Protein-calorie malnutrition -  On tube feeds.Hematology:Acute blood loss anemia. Hgb 7.9 No acute indication for transfusion.     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 On Heparin drip. Endocrine:  Insulin protocol for hyperglycemiaLantusMusculoskeletal:  PTSkin/wound: Frequent repositioning to mitigate against pressure ulcerationGoals of Care/Advance Directives:Code status:   DNRProphylaxis:  As abovePlan discussed with:  Primary Service, Critical Care Nursing, Respiratory TherapyThis patient is critically ill as indicated by the following Acute Respiratory FailureThe patient has required 32 minutes of my undivided attention during the past 24 hours for one of the previous mentioned life threatening diagnoses. The critical care interventions have included: Management of Acute Respiratory Failure with adjustments to ventilator settings/mode to maintain adequate ventilation / oxygenation  and initiation of spontaneous breathing trials Wendee Copp, MD SICU Attending

## 2020-11-12 NOTE — Plan of Care
Plan of Care Overview/ Patient Status    Problem: Adult Inpatient Plan of CareGoal: Readiness for Transition of CareOutcome: Interventions implemented as appropriate LOS 16 D78 y.o. male admitted to the SICU on 2/9 for ventilator management following an RRT on the oncology floor for desaturation and increased work of breathing while on trach mask and ultimately placed back on vent.  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 He underwent trach exchange 2/5. Was on trach mask on oncology floor however had 2 episodes on floor requiring RRT for lavage and likely mucous plugging. PT evaluation 11/11/20 recommending LTACH.Prior to admission at [facility].Care Management will continue to follow with team. Gershon Crane RN, BSN, [facility] Manager NICU/SICUPhone# [phone]#  [phone]

## 2020-11-12 NOTE — Other
[facility] Evaluation Consult NotePatient Data:  Patient Name: Arthur [name] Age: 55 y.o. DOB: [birth year]	 MRN: [id number]	 Length of Stay: 34ICU Length of Stay: 28d 12hDate of Consult: 03/10/22Current Vent settings:  Trialing details: Tolerating PSV 10/5, attempting 8/5Suctioning needs/frequency: InfrequentDispo: Chronic vent: yesCurrent med infusions: ? heparin 25,000 units/250 ml infusion - High Risk of Bleeding 16 Units/kg/hr (11/12/20 0700) ? insulin regular human 100 units in sodium chloride 0.9 % 100 mL 3 mL/hr at 11/12/20 0700 Palliative consult: yesActive Lines: Active Lines   Active PICC Line / CVC Line / ART Line   Name Placement date Placement time Site Days  Implanted Port - single lumen port 08/12/20  08/12/20  --  open-ended catheter  92    Pulmonologist: noDecision: Accepted: based on nursing evaluation and bed availability.Electronically Signed: Debbora Dus, MD 11/12/2020 9:20 AM

## 2020-11-12 NOTE — Plan of Care
Plan of Care Overview/ Patient Status    Pt alert and anxious/restless at times. Pupils 3mm brisk. MAE 3-4/5 strength. Follows simple commands. Reorientation provided. Ativan given PRN for anxiety. Dilaudid, and oxycodone given for pain with good effect. Wrist restraints in place for safety with airway while being on vent. ST HR 100's-130's, afebrile. Heparin gtt continued. LUE ultrasound completed due to noted firmness and swelling, pending results. Insulin gtt continued, rate reduced with increase in long acting insulin administration. Pt maintaining adequate oxygenation on CPAP 9/5 per MD order. Airway intact, trach site remains moist, drainage noted. Xeroform and sponge foam in place. Suctioning needed q1hr, small amount of thin white yellow sputum noted. Coughing fits with any burst of anxiety. Breathing treatments given by RT. Abdomen rounded, obese soft. No BM. Full bowel regime given. TF at goal 65ml/hr Nepro Full strength via PEG tube. FWB given q4h. Foley d/c'd 1430, due to void 2030. Purewick in place with peri pads. Perineum skin excoriation improving with fungal cream and zinc. No family calls in today. No family at bedside. Pt updated on POC.

## 2020-11-12 NOTE — Plan of Care
Inpatient Physical Therapy Re-Evaluation     IP Adult PT Eval/Treat - 11/11/20 2041          Date of Visit / Treatment    Date of Visit / Treatment 11/11/20     Note Type Re-Evaluation   RECONSULT    Progress Report Due 11/25/20     End Time 1130        General Information    Pertinent History Of Current Problem HPI: 55 y.o. male admitted to the SICU on 2/9 for ventilator management following an RRT on the oncology floor for desaturation and increased work of breathing while on trach mask and ultimately placed back on vent.     Subjective trach and vented     General Observations in bed in SICU in NAD, vs- tachycardic, tachypneic, hypertensive     Precautions/Limitations fall precautions;bed alarm     Precautions/Limitations Comment bilateral wrist restraints        Prior Level of Functioning/Social History    Additional Comments per chart, lives alone, independent at baseline, was at rehab prior to hospital admission        Vital Signs and Orthostatic Vital Signs    Vital Signs Free text HR 123-134, 98%CPAP, 182/78, 34 RR        Pain/Comfort    Pain Comment (Pre/Post Treatment Pain) denies pain        Cognition    Orientation Level Oriented to person     Level of Consciousness lethargic     Following Commands Inconsistently able to follow one step commands     Cognition Comments minmal following commands, +impulsive        Vision/ Hearing    Vision Assessment Comments not able to fix eyes        Range of Motion    Range of Motion Examination bilateral upper extremity ROM was WFL;bilateral lower extremity ROM was [facility]     Range of Motion Comments hands limited by edema        Musculoskeletal    LUE Muscle Strength Grading 4-->active movement against gravity and resistance     RUE Muscle Strength Grading 4-->active movement against gravity and resistance     LLE Muscle Strength Grading 4-->active movement against gravity and resistance     RLE Muscle Strength Grading 4-->active movement against gravity and resistance Musculoskeletal Comments +spontaneous movement, able to mobilize self in bed        Muscle Tone    Muscle Tone Testing Results No muscle tone deficits noted        Coordination    Fine Motor Coordination impaired        Sensory Assessment    Sensory Tests Results Not tested        Skin Assessment    Skin Assessment See Nursing Documentation        Balance    Sitting Balance: Static  --   not tested       Bed Mobility    Bed Mobility Comments minimal command follwoing , +impulsive        Sit-Stand Transfer Training    Sit-to-Stand Transfer Independence/Assistance Level Unable to perform        Gait Training    Independence/Assistance Level  Unable to perform        Stair Performance    Independence/Assistance Level  Unable to perform        Handoff Documentation    Handoff Patient in bed;Bed alarm;Wrist restraints;Patient instructed to call nursing for  mobility;Discussed with nursing        PT- AM-PAC - Basic Mobility Screen- How much help from another person do you currently need.....    Turning from your back to your side while in a a flat bed without using rails? 1 - Total - Requires total assistance or cannot do it at all.     Moving from lying on your back to sitting on the side of a flat bed without using bed rails? 1 - Total - Requires total assistance or cannot do it at all.     Moving to and from a bed to a chair (including a wheelchair)? 1 - Total - Requires total assistance or cannot do it at all.     Standing up from a chair using your arms(e.g., wheelchair or bedside chair)? 1 - Total - Requires total assistance or cannot do it at all.     To walk in a hospital room? 1 - Total - Requires total assistance or cannot do it at all.     Climbing 3-5 steps with a railing? 1 - Total - Requires total assistance or cannot do it at all.     AMPAC Mobility Score 6     TARGET Highest Level of Mobility Mobility Level 2, Turn self in bed/bed activity/dependent transfer         Therapeutic Exercise    Therapeutic Exercise Comments BUEs and BLEs AAROM/PROM performed        Clinical Impression    Initial Assessment Patient presents with deconditioning, impaired cognition- inconsistent following commands, impulsive, and with pulmonary dysfunction limiting pt's independent functional mobility. Will benefit from PT to maximize function.  Will follow. D/w RN.        Patient/Family Stated Goals    Patient/Family Stated Goal(s) unable to state        Frequency/Equipment Recommendations    PT Frequency 3x per week     What day of week is next treatment expected? Thursday        PT Recommendations for Inpatient Admission    Activity/Level of Assist dangle edge of bed;assist of 2     Positioning chair position of the bed        PT Discharge Summary    Physical Therapy Disposition Recommendation Rehab at [facility]                     Problem: Physical Therapy Goals  Goal: Physical Therapy Goals  Description: PT GOALS Revised  PT GOALS  1. Patient will demonstrate active range of motion x 4 extremities in preparation for mobility  2. Caregivers will be independent and safely implement a range of motion/positioning program to prevent loss of range of motion and skin integrity  3. Patient will perform bed mobility with maximal assist  4. Patient will tolerate sitting edge of bed >10 minutes with moderate assist  5. Patient will perform transfers with moderate assist of 2 using rolling walker        Outcome: Revised     Efraim Kaufmann, PT, MS       In Directory of [employer]

## 2020-11-12 NOTE — Progress Notes
[facility]	SICU Progress NoteAttending Provider: Alric Quan, MDPost-Operative Day/Post Injury Day Procedure(s):2/5: [name] exchange (ENT) Hospital LOS: 34 days Interim History:  HPI: 55 y.o. male admitted to the SICU on 2/9 for ventilator management following an RRT on the oncology floor for desaturation and increased work of breathing while on trach mask and ultimately placed back on vent. ?Patient was initially a transfer from [facility] on 2/4 after trach and PEG when he had a partial obstruction of trach site from a new necrotic mass. He underwent trach exchange 2/5. Was on trach mask on oncology floor however had 2 episodes on floor requiring RRT for lavage and likely mucous plugging. ?Course Complicated by: - Incidental R IJ thrombus - Multiple episodes of presumed mucous plugging requiring RRT's ?PMH: Chronic ITP, Waldenstrom macroglobulinemia, HTN. HLD, DM, tobacco and alcohol use, Laryngeal SCC (weekly Cisplatin and RT)?PSH: None known ?Home meds: Lipitor, Pepcid, Lisinopril, Metformin, Oxycodone, miralax, Insulin U100 and Lantus SICU course: (see progress note from 3/6 for previous days events)3/6: PSV 10/5 as tolerated today, rest on PCV. Discussed hyperkalemia with pharmacy, OK for further lokelma doses but since patient not having BMs, likely not very effective. Will give dose of senna, consider standing bowel regimen tomorrow. Episode of acute agitation/tachycardia to 140s requiring increase in precedex gtt with good effect, minimal secretions. PM K+ 5.5(5.8) --> lokelma 10g. O/N: Lactulose x 1, no BM. K down to 5.0. Rested on PCV.3/7: Attempted PSV 8/5, immediately became tachypneic to mid 40's, HR to 140's; Rested on PCV x 1 hour then returned to PSV 10/5, will continue as tolerated. Clonidine taper started, PRN Ativan increased to 1mg  Q4h, attempting to wean Precedex. Heparin gtt started for known IJ thrombus. ON: Remained on 10/5. 3/8: 10/5 as tolerated. Increased secretions, CXR with L sided consolidation / effusion, though no effusion seen on bedside ultrasound. Low grade temp, plan to reculture if spikes temp or worsening oxygenation. Bicarb tabs d/c'ed, FWF Q4h added. Lantus increased to 25u BID in attempt to wean insulin gtt. 6:30pm: Noted to have trach dislodged, visible distress and desatting to teens; trach successfully replaced, CXR pending, ENT aware. Placed on PCV for rest. ON: CXR with trach in stable position. Placed back on PSV 10/5; tolerating well. Tmax 100.9, afebrile majority of night. 3/9: Concern for trach balloon not holding, trach exchanged by ENT and now appropriately secured. D5W @ 10mL/hr added for worsening hypernatremia. No BM x several days, MoM added. Haldol d/c'ed given no effect and minimal ativan use, continue PRN ativan for now. ON: Required additional 0.5 ativan x1 for acute agitation resulting in tachycardia and desat on PSV. 3/10: Intermittently anxious, agitated, but maintaining sPO2. Will trial PSV 9/5 later today. Relatively stable 24hr insulin requirements over last few days --> increase lantus to 40 BID with plan to transition off insulin gtt tomorrow. HyperNa improved, will stop D5W today. Give dulcolax suppository and trial d/c foley today.Review of Allergies/Meds/Hx: I have reviewed the patient's: allergies, past medical history, past surgical history, family history, social history, prior to admission medicationsObjective: Vitals:I have reviewed the patient's current vital signs as documented in the EMRTemp:  [98.78 ?F (37.1 ?C)-101.12 ?F (38.4 ?C)] 99.86 ?F (37.7 ?C)Pulse:  [92-142] 92Resp:  [16-41] 20BP: (98-205)/(54-112) 112/57SpO2:  [91 %-100 %] 100 %SpO2: 100 %Device (Oxygen Therapy): mechanical ventilatorI/O's:I have reviewed the patient's current I&O's as documented in the EMR.Gross Totals (Last 24 hours) at 11/12/2020 0505Last data filed at 11/12/2020 0400Intake 4178.7 ml Output 3220 ml Net 958.7 ml Physical  ExamConstitutional: Middle aged trached man in NADHEENT: NCAT. PERRL, EOMI. External ears normal. Bivona trach midline with xeroform distal skinCardiovascular: RRR, no MRG appreciated. Distal pulses are 2+ bilaterally.Pulmonary: Trached on vent. Tachypneac at times. Suctioned for small amount of thin secretions.Abdominal: Soft, obese, non distended, non tender to palpation, +BS. PEG Tube site intactGenitourinary: Foley catheter in place, draining clear yellow urine.Musculoskeletal: BUE strength 4/5, BLE 2/5Neurological: Intermittent opens eyes, nods head, FSC. Agitated at timesSkin: Warm and dry.Psychiatric: Unable to assessLabs: Refer to EMR. I have reviewed the patient's labs within the last 24 hours; see assessment and plan below for significant abnormals.Microbiology:Refer to EMR. I have reviewed all new results within the last 24 hours; see assessment and plan below.Diagnostics:Refer to EMR. I have reviewed all new results within the last 24 hours; see assessment and plan below.Chest GN:FAOZH to EMR. I have reviewed all new results within the last 24 hours; see assessment and plan below.Abdominal YQ:MVHQI to EMR. I have reviewed all new results within the last 24 hours; see assessment and plan below.ECG/Tele Events: I have reviewed the patient's ECG and telemetry as resulted in the EMR.Assessment/Plan Neurologic: Analgesia- Tylenol 650 Q6 PRN - Oxycodone 10 / 15 Q4 PRN - Dilaudid 1 PRN breakthroughAnxiety / Depression- Precedex gtt, wean as able- Clonidine taper to assist with precedex wean- Clonazepam 0.5mg  BID- Ativan 1mg  IV Q6h PRN- Appreciate palliative care and psych consults?Cardiac: Hx of HTN / HLD- Hold home lisinopril, lasix; resume when appropriate - Home statin?Respiratory: Respiratory failure 2/2 circumferential tumor invasion of stoma, mucous plugging / trach occlusion- PSV 10 / 5 as tolerated, continue to adjust Pi as tolerated- Rest on PCV IP 12, +5 PRN- S/p Decadron 8mg  BID for tumor inflammation (2/22 - 2/24) per med oncology- Albuterol Q6h PRNENT: L laryngeal SCC invading tracheostomy; s/p trach exchange (2/5) and biopsy (2/7); trach exchange (3/9)- #8 hyperflex Bivona trach to bypass obstruction; keep spare at bedside - ENT planning for custom bivona trach, ongoing discussions with insurance company for approval - Unfortunately, there are no surgical / curative options- S/p palliative radiation (2/16, 2/17, 2/18, 2/21, 2/22, 2/23)- Appreciate med onc input, no current recommendations, signed off; reconsult when ready for transfer to floor (will go under med onc service) or if needed to assist with GOC discussionsGI: Protein-calorie malnutrition- TF at goal via PEG (Nepro 2/2 hyperkalemia)- FWF as below while on concentrated formula- Bowel regimenPUD prophylaxis- Pepcid while vented?Renal: AKI on CKD (baseline Cr 1.5-1.6), 2/2 supratherapeutic vancomycin level- Creat stable ~2, continue to trend- Foley replaced 2/17 for urinary retention, will trial removal again 3/10; may need urology follow up if still unable to void spontaneously Hypernatremia- FWF Q4h- D5W@50mL /hr, stop todayHyperkalemia, resolved- Medical management as needed - Nepro TF as aboveInfectious Disease:  MRSA pneumonia, resolved- S/p 10 day course of vanco --> doxy (2/24 - 3/5)- Reculture if febrile / worsening oxygenation given increased suctioning requirements?Hematology:Critical illness anemia- Sanguinous secretions, expected given tumor burden and coagulopathy, may worsen with radiation and baseline hematologic disease- Multivitamins and cofactors- Maintain Hgb >7R IJ thrombus / DVT prophylaxis- High risk heparin gtt - Maintain SCDs at all times?Hx of chronic ITP, Waldenstrom macroglobulinemia- Transfuse platelets if <30k, bleeding, or need for invasive procedure per heme- No need to treat ITP unless plts <30k- Appreciate hematology recommendations?Endocrine: Hyperglycemia / Hx T2DM- Lantus 40 units BID- Insulin gtt (2/28 - )- HOLD home Metformin, Lantus 64u, Apidra 20u TID w meals- Appreciate diabetes team recs, signed off for now, reconsult when appropriateMusculoskeletal: Generalized deconditioning- OOB daily, PT/OT  evals- Turn and reposition Q2 hours, PUP dressing?Device Review:- R IJ Port (08/12/20) per guidelines, nonocclusive DVT does not require removal of line- PEG (1/10)- Foley (2/17)- #8 hyperflex Bivona trach (3/9)?Code Status / Dispo:- Transition to DNR (see ACP note 2/25)- Plan for transfer to med onc service when ready for floor (med onc in agreement, have signed off while in SICU; reconsult when ready for floor / if needed for GOC discussions)- Will also ask pulmonary service if patient can be appropriate for 10-7 transfer- Appreciate palliative care assistance with GOC discussionsNotifications : For questions please call SICU Covering Provider in Epic EMR (primary) SICU [facility] Dynamic Roles (secondary)[facility] 7-1 APP			[phone] 6-1 Frontside		[phone] 6-1 Backside		[phone] Verdi-2 APP			[phone]:Ortha Metts, PA-CSICU APPMHB [phone]/06/2021

## 2020-11-12 NOTE — Plan of Care
Plan of Care Overview/ Patient Status    Pt opens eyes spontaneously. Follows commands. MAE. Nods yes/no to simple questions appropriately. Clonidine taper continued. PRN Oxycodone administered x3 w/good effect. PRN Dilaudid administered x3 w/good effect. PRN Ativan administered x1 in beginning of shift w/little effect for agitation; administered in early AM for agitation w/good effect. Bilat soft wrist restraints remain in place. tmax 101.1. NSR/ST 80-100's; 130-140's when agitated. SBP's 100-120's; 180-200's when agitated. Heparin gtt continued. R Chest Port remains in place. Daily labs obtained. Pt trached w/#8 Bovina @ 14. Pt on CPAP 10-5. Pt suctioned frequently (Q79min) for thin, tan/brown secretions. LS coarse. NPO. PEG tube remains in place, TF Nepro, infusing @ Goal of 65cc/hr. 200cc FWF continued Q4hr. D5W gtt continued. Insulin gtt continued. Foley remains in place w/moderate yellow output. No BM this shift. [name], RN3/06/2021 6:20 AM

## 2020-11-12 NOTE — Other
[facility]    [facility]     Palliative Care Follow-Up Note    Present History   Chart reviewed and case discussed with SICU team. Per notes, Arthur [name] has been evaluated and accepted for transfer to 10-7 to continue vent weaning. He is now off Precedex but continues on clonidine and scheduled Klonopin as well as PRN Ativan. He continues on alternating oxycodone and IV Dilaudid PRN.    I examined Arthur [name] this morning. I did not attempt to rouse him as he reportedly continues to become agitated when awakened. He appeared comfortable at this time.    I placed a phone call to Arthur [name] this afternoon. Arthur [name] shared that she has not been able to get to [facility] to visit as her son continues to decline and has been admitted to the hospital. She anticipates that he will be transitioned to hospice in coming days. She plans to visit [facility] tomorrow and will notify me when she arrives so that we can talk more in person. Emotional support extended.     Review of Allergies/Hx/Meds   Allergies  Patient has No Known Allergies.    Medical History  Past Medical History:   Diagnosis Date   ? Diabetes mellitus (HC Code) (HC CODE)    ? Hypercholesteremia    ? Hypertension        Family History  Family History   Problem Relation Age of Onset   ? Aneurysm Mother        Social History  Social History     Socioeconomic History   ? Marital status: Legally Separated     Spouse name: Not on file   ? Number of children: Not on file   ? Years of education: Not on file   ? Highest education level: Not on file   Occupational History   ? Not on file   Tobacco Use   ? Smoking status: Former Smoker     Packs/day: 1.00     Years: 30.00     Pack years: 30.00     Quit date: 03/04/2013     Years since quitting: 7.6   Substance and Sexual Activity   ? Alcohol use: Yes     Alcohol/week: 14.0 standard drinks     Types: 14 Cans of beer per week   ? Drug use: Not on file   ? Sexual activity: Not on file Other Topics Concern   ? Not on file   Social History Narrative   ? Not on file     Social Determinants of Health     Financial Resource Strain:    ? Difficulty of Paying Living Expenses:    Food Insecurity:    ? Worried About Programme researcher, broadcasting/film/video in the Last Year:    ? Barista in the Last Year:    Transportation Needs:    ? Freight forwarder (Medical):    ? Lack of Transportation (Non-Medical):    Physical Activity:    ? Days of Exercise per Week:    ? Minutes of Exercise per Session:    Stress:    ? Feeling of Stress :    Social Connections:    ? Frequency of Communication with Friends and Family:    ? Frequency of Social Gatherings with Friends and Family:    ? Attends Religious Services:    ? Active Member of Clubs or Organizations:    ?  Attends Banker Meetings:    ? Marital Status:    Intimate Partner Violence:    ? Fear of Current or Ex-Partner:    ? Emotionally Abused:    ? Physically Abused:    ? Sexually Abused:        InPatient Medications  Current Facility-Administered Medications   Medication Dose Route Frequency Last Rate   ? acetaminophen  650 mg Per G Tube Q6H PRN     ? albuterol  2.5 mg Nebulization Q6H PRN     ? ascorbic acid  250 mg Per G Tube Daily     ? bisacodyL  10 mg Rectal DAILY PRN     ? carboxymethylcellulose  1 drop Both Eyes TID PRN     ? chlorhexidine gluconate  15 mL Mouth/Throat Q12H     ? clonazePAM  0.5 mg Per J Tube BID     ? cloNIDine HCL  0.2 mg Per G Tube Q8H      Followed by   ? [START ON 11/13/2020] cloNIDine HCL  0.2 mg Per G Tube Q12H      Followed by   ? [START ON 11/16/2020] cloNIDine HCL  0.2 mg Per G Tube Q24H     ? insulin regular human 100 units in sodium chloride 0.9 % 100 mL   Intravenous Continuous 3 mL/hr at 11/12/20 0700    And   ? dextrose injection  25 g Intravenous Q15 MIN PRN      And   ? dextrose injection  12.5 g Intravenous Q15 MIN PRN     ? famotidine  20 mg Per J Tube Daily     ? folic acid  1 mg Per G Tube Daily     ? glucagon  1 mg Intramuscular Once PRN     ? heparin 25,000 units/250 ml infusion - High Risk of Bleeding  7-30 Units/kg/hr (Dosing Weight) Intravenous Continuous 16 Units/kg/hr (11/12/20 0700)   ? HYDROmorphone  1 mg IV Push Q3H PRN     ? insulin glargine  15 Units Subcutaneous Once     ? insulin glargine  40 Units Subcutaneous Q12H     ? LORazepam  1 mg IV Push Q6H PRN     ? magnesium hydroxide  30 mL Per G Tube Daily     ? miconazole nitrate   Topical (Top) BID     ? midazolam (PF)         ? oxyCODONE  10 mg Per G Tube Q4H PRN     ? oxyCODONE  15 mg Per G Tube Q4H PRN     ? polyethylene glycol  17 g Per G Tube BID     ? amino acids-protein hydrolys  1 packet Per G Tube TID     ? rosuvastatin  10 mg Per G Tube Daily     ? senna  1 tablet Per G Tube BID     ? thiamine  50 mg Per G Tube Daily     ? zinc oxide  1 Application Topical (Top) DAILY PRN         [email or web address]    Palliative Performance Scale 30%   (Modification of Karnofsky and was designed for measurement of physical status in Palliative Care. Only 10% of patients with PPS score <=50% would be expected to survive >6 months)    Physical Exam  Vitals and nursing note reviewed.   Constitutional:       General: He  is not in acute distress.     Appearance: He is ill-appearing.   Pulmonary:      Effort: No respiratory distress.      Comments: Trached to vent  Neurological:      Comments: Did not attempt to rouse given reports of ongoing agitation when alert         Vitals:  Temp:  [99.86 ?F (37.7 ?C)-101.12 ?F (38.4 ?C)] 100.4 ?F (38 ?C)  Pulse:  [92-142] 106  Resp:  [16-41] 22  BP: (98-205)/(51-112) 124/70  SpO2:  [91 %-100 %] 99 %  ECOG Performance Status (non-calculated): 3  calc. ECOG (from Karnofsky): 2  Wt Readings from Last 3 Encounters:   11/12/20 (!) 144.2 kg   10/06/20 (!) 139.5 kg   09/29/20 (!) 142.4 kg       Review of Labs/Diagnostics  Complete Blood Count:  Recent Labs   Lab 11/10/20  0518 11/11/20  0509 11/12/20  0536   WBC 10.5 8.9 10.9   HGB 8.1* 7.3* 7.9*   HCT 27.40* 24.90* 25.40*   PLT 136* 128* 144*     Comprehensive Metabolic Panel:  Recent Labs   Lab 11/10/20  0518 11/10/20  0559 11/11/20  0509 11/11/20  0512 11/12/20  0536 11/12/20  0610 11/12/20  0805   NA 147*  --  149*  --  143  --   --    K 5.0  --  4.3  --  4.6  --   --    CL 114*  --  114*  --  109*  --   --    CO2 24  --  26  --  26  --   --    BUN 84*  --  83*  --  74*  --   --    CREATININE 1.90*  --  1.94*  --  1.88*  --   --    GLU 209* - 226*   < > 129*   < > 158* - 167*   < > 152*   CALCIUM 10.0  --  9.7  --  9.8  --   --     < > = values in this interval not displayed.     No results for input(s): ALKPHOS, BILITOT, BILIDIR, PROT, ALT, AST in the last 168 hours.    Invalid input(s): LABALBU    Diagnostics:  XR Chest PA or AP    Result Date: 11/11/2020  XR CHEST PA OR AP Date: 11/11/2020 11:14 AM INDICATION: s/p trach exchange COMPARISON: XR CHEST PA OR AP 2022-Mar-08 FINDINGS: Support Devices: Exchanged tracheostomy tube is 3.6 cm above carina. Stable right chest port placement. Improving small layering left pleural effusion with left basilar consolidation. Diminished right apical hazy opacity. Unchanged appearance to the cardiomediastinal silhouette.      Tracheostomy tube 3.6 cm above carina. Improving small left pleural effusion. Reported And Signed By: Maudie Flakes, MD  [facility] Radiology and Biomedical Imaging     XR Chest PA or AP    Result Date: 11/10/2020  XR CHEST PA OR AP Date: 11/10/2020 6:48 PM INDICATION: confirm trach placement. COMPARISON: November 10, 2020 chest radiograph. FINDINGS: Support Devices: Tracheostomy tube terminates 4.2 cm above the carina. Right chest wall chemotherapy port terminates in the right atrium. No interval pneumothorax. Otherwise no significant change since prior examination from earlier today.      Tracheostomy tube terminates 4.2 cm above the carina. Right chest wall chemotherapy port terminates in  the right atrium. No interval pneumothorax. Reported And Signed By: Antonieta Loveless, MD  [facility] Radiology and Biomedical Imaging         Impression/Plan:   Arthur [name] is a 55 y.o. 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 Admitted to SICU on 2/9 for ventilator management following RRT on oncology floor for desaturation and increased work of breathing while on trach mask, ultimately placed back on vent. Referred for Palliative Care Consult by Attending Provider: Alric Quan, MD [phone] for supportive care.    Coping/Support:   - Ongoing assessments for psychosocial and/or spiritual needs  - Full interdisciplinary team support available as needed    Advance Care Planning:  - DNR following family meeting of 2/25  - No advance directives or health care proxy documentation on file  - His sister and niece are listed as emergency contacts and his sister is next of kin and default proxy decision maker in the event that patient lacks capacity    Goals of Care:   - Ongoing discussions. Family aware of concern for worsening condition and overall poor prognosis, do not want to see him suffer. Following most recent family meeting, they elected to make him DNR but otherwise continue with the current plan of care, as they believe this best represents what Sela Hua would choose for himself if he were able to verbalize his wishes.     Thank you for involving the Palliative Care team in the care of this patient. The Palliative Care team will continue to follow.    Patient was evaluated: in person    Time committed: > 15 minutes were spent in chart review, coordination of care, and at the bedside in patient/family counseling and education, including detailed concepts of symptom managment and goals of care/advanced care planning.    Signed:  Debbrah Alar, APRN  Palliative Care Consult Service  11/12/2020 10:04 AM

## 2020-11-12 NOTE — Progress Notes
Otolaryngology Progress NoteDate: 11/11/2020 Interim History- trach dislodged spontaneously but replaced w/o problem by SICU team- pending custom trachVitalsTemp:  [98.78 ?F (37.1 ?C)-100.94 ?F (38.3 ?C)] 98.78 ?F (37.1 ?C)Pulse:  [79-142] 110Resp:  [16-34] 27BP: (91-200)/(59-97) 180/88SpO2:  [96 %-100 %] 100 %Device (Oxygen Therapy): mechanical ventilator I/O last 3 completed shifts:In: 3024.6 [P.O.:30; I.V.:774.6; NG/GT:2220]Out: 3220 [Urine:3220]Physical ExamGen: alert, vented PCEyes: open, normal external appearanceFace: grossly symmetricNose: clear anteriorlyOP/OC: hemostaticNeck: Bivona 8 cuffed secured w/soft trach ties, necrotic but hemostatic mass surrounding the stoma, remaining neck flat, firm tumor Pulm: no increased WOB on TMDiagnostic DataReviewed, per chartAssessment & Plan54 M with PMH of HTN, HLD, DM, tobacco and EtOH use, Waldenstrom macroglobulinemia, 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 Admitted to SICU for ventilatory support. No acute ENT intervention planned; no curative or meaningful surgical options at this time. Course complicated by multiple episodes of Bivona trach occlusion/obstruction.  - custom Bivona trach ordered- no ENT intervention planned at this time; case discussed at tumor board 2/14 and is not a surgical candidate - disease management/goals-of-care best pursued via non-surgical means; eg, Medical Oncology, Radiation Oncology, Palliative Care- transfer to Medical Oncology floor when eligible - continue routine trach care with careful suctioning- ensure trach well secured with tiesElectronically Signed MW:UXLKG Santore DDSContact: MHB3/9/20227:48 AMOtolaryngology Progress NoteDate: 11/12/2020 Interim History- trach change by ENT bedside yesterday- pending custom trachVitalsTemp:  [99.68 ?F (37.6 ?C)-101.12 ?F (38.4 ?C)] 100.22 ?F (37.9 ?C)Pulse:  [92-142] 98Resp:  [16-41] 17BP: (98-205)/(54-112) 125/66SpO2:  [91 %-100 %] 99 %Device (Oxygen Therapy): mechanical ventilator I/O last 3 completed shifts:In: 4031.2 [I.V.:1271.2; NG/GT:2760]Out: 3380 [Urine:3380]Physical ExamGen: alert, vented PCEyes: open, normal external appearanceFace: grossly symmetricNose: clear anteriorlyOP/OC: hemostaticNeck: Bivona 8 cuffed secured w/soft trach ties, necrotic but hemostatic mass surrounding the stoma, remaining neck flat, firm tumor Pulm: no increased WOB on TMDiagnostic DataReviewed, per chartAssessment & Plan54 M with PMH of HTN, HLD, DM, tobacco and EtOH use, Waldenstrom macroglobulinemia, 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 Admitted to SICU for ventilatory support. No acute ENT intervention planned; no curative or meaningful surgical options at this time. Course complicated by multiple episodes of Bivona trach occlusion/obstruction.  - custom Bivona trach ordered- no ENT intervention planned at this time; case discussed at tumor board 2/14 and is not a surgical candidate - disease management/goals-of-care best pursued via non-surgical means; eg, Medical Oncology, Radiation Oncology, Palliative Care- transfer to Medical Oncology floor when eligible - continue routine trach care with careful suctioning- ensure trach well secured with tiesElectronically Signed MW:NUUVO Santore DDSContact: MHB3/10/20227:48 AMAddendum: Patient has an 8 hyperflex bivona cuffed in place (the flange around this trach was indicating a 6 hyperflex bivona). Patient laid flat, ties removed. #8 hyperflex bivona cuff deflated and removed. Stoma cleaned. Posterior trachea well visualized through gross tumor which is growing out of trachea. #8 hyperflex bivona cuff placed with obturator. Obturator removed, inner cannula placed and trach secured with soft ties. After soft ties were secured, they were checked for appropriate tightness and security. Patient tolerated procedure well. Extra trach marked and left at bedside. - Continue routine trach careParsa Nani Skillern, MDChief Resident, PGY- VDivision of Otolaryngology-Head and Neck [facility]

## 2020-11-13 LAB — CBC WITH AUTO DIFFERENTIAL
BKR WAM ABSOLUTE IMMATURE GRANULOCYTES.: 0.14 x 1000/ÂµL (ref 0.00–0.30)
BKR WAM ABSOLUTE LYMPHOCYTE COUNT.: 0.45 x 1000/ÂµL — ABNORMAL LOW (ref 0.60–3.70)
BKR WAM ABSOLUTE NRBC (2 DEC): 0 x 1000/ÂµL (ref 0.00–1.00)
BKR WAM ANALYZER ANC: 8.24 x 1000/ÂµL — ABNORMAL HIGH (ref 2.00–7.60)
BKR WAM BASOPHIL ABSOLUTE COUNT.: 0.05 x 1000/ÂµL (ref 0.00–1.00)
BKR WAM BASOPHILS: 0.5 % (ref 0.0–1.4)
BKR WAM EOSINOPHIL ABSOLUTE COUNT.: 0.76 x 1000/ÂµL (ref 0.00–1.00)
BKR WAM EOSINOPHILS: 6.9 % — ABNORMAL HIGH (ref 0.0–5.0)
BKR WAM HEMATOCRIT (2 DEC): 25.6 % — ABNORMAL LOW (ref 38.50–50.00)
BKR WAM HEMOGLOBIN: 7.7 g/dL — ABNORMAL LOW (ref 13.2–17.1)
BKR WAM IMMATURE GRANULOCYTES: 1.3 % — ABNORMAL HIGH (ref 0.0–1.0)
BKR WAM LYMPHOCYTES: 4.1 % — ABNORMAL LOW (ref 17.0–50.0)
BKR WAM MCH (PG): 27.3 pg (ref 27.0–33.0)
BKR WAM MCHC: 30.1 g/dL — ABNORMAL LOW (ref 31.0–36.0)
BKR WAM MCV: 90.8 fL (ref 80.0–100.0)
BKR WAM MONOCYTE ABSOLUTE COUNT.: 1.33 x 1000/ÂµL — ABNORMAL HIGH (ref 0.00–1.00)
BKR WAM MONOCYTES: 12.1 % — ABNORMAL HIGH (ref 4.0–12.0)
BKR WAM MPV: 12.1 fL — ABNORMAL HIGH (ref 8.0–12.0)
BKR WAM NEUTROPHILS: 75.1 % — ABNORMAL HIGH (ref 39.0–72.0)
BKR WAM NUCLEATED RED BLOOD CELLS: 0 % (ref 0.0–1.0)
BKR WAM PLATELETS: 153 x1000/ÂµL (ref 150–420)
BKR WAM RDW-CV: 17.6 % — ABNORMAL HIGH (ref 11.0–15.0)
BKR WAM RED BLOOD CELL COUNT.: 2.82 M/ÂµL — ABNORMAL LOW (ref 4.00–6.00)
BKR WAM WHITE BLOOD CELL COUNT: 11 x1000/ÂµL (ref 4.0–11.0)

## 2020-11-13 LAB — PHOSPHORUS     (BH GH L LMW YH): BKR PHOSPHORUS: 3.6 mg/dL (ref 2.2–4.5)

## 2020-11-13 LAB — BASIC METABOLIC PANEL
BKR ANION GAP: 7 (ref 7–17)
BKR BLOOD UREA NITROGEN: 71 mg/dL — ABNORMAL HIGH (ref 6–20)
BKR BUN / CREAT RATIO: 36.4 — ABNORMAL HIGH (ref 8.0–23.0)
BKR CALCIUM: 9.9 mg/dL (ref 8.8–10.2)
BKR CHLORIDE: 108 mmol/L — ABNORMAL HIGH (ref 98–107)
BKR CO2: 28 mmol/L (ref 20–30)
BKR CREATININE: 1.95 mg/dL — ABNORMAL HIGH (ref 0.40–1.30)
BKR EGFR (AFR AMER): 43 mL/min/{1.73_m2} (ref >60)
BKR EGFR (NON AFRICAN AMERICAN): 36 mL/min/{1.73_m2} (ref >60)
BKR GLUCOSE: 205 mg/dL — ABNORMAL HIGH (ref 70–100)
BKR POTASSIUM: 4.5 mmol/L (ref 3.3–5.3)
BKR SODIUM: 143 mmol/L (ref 136–144)

## 2020-11-13 LAB — MAGNESIUM: BKR MAGNESIUM: 2.2 mg/dL (ref 1.7–2.4)

## 2020-11-13 LAB — PARTIAL THROMBOPLASTIN TIME     (BH GH LMW Q YH): BKR PARTIAL THROMBOPLASTIN TIME: 50.8 s — ABNORMAL HIGH (ref 23.0–31.4)

## 2020-11-13 MED ORDER — INSULIN U-100 REGULAR HUMAN 100 UNIT/ML (SLIDING SCALE)
100 unit/mL | Freq: Four times a day (QID) | SUBCUTANEOUS | Status: DC
Start: 2020-11-13 — End: 2020-11-13

## 2020-11-13 MED ORDER — INSULIN U-100 REGULAR HUMAN 100 UNIT/ML (SLIDING SCALE)
100 unit/mL | Freq: Four times a day (QID) | SUBCUTANEOUS | Status: DC
Start: 2020-11-13 — End: 2020-11-21
  Administered 2020-11-13 – 2020-11-21 (×31): 100 mL via SUBCUTANEOUS

## 2020-11-13 NOTE — Plan of Care
Plan of Care Overview/ Patient Status    [phone]: Pt anxious, prn dilaudid and clonidine taper given. PERRL. MAEx4. NS-ST on tele. MAP>60, hypertensive when anxious. Afebrile, tmax 99.8. Hep gtt infusing. #8 Bivona in place, suction q3h. Vent settings unchanged. [name] ties changed and trach care done. Peg c/d/I. Tolerating tube feeds at goal. FWF 200cc q4h. No BM this shift. Foley in place, adequate urine output. Barrier cream applied to bottom, pup in place. Safety precautions maintained, see ICU flowsheets for details.

## 2020-11-13 NOTE — Plan of Care
Plan of Care Overview/ Patient Status    Pt opens eyes spontaneously. Follows commands. MAE. Nods yes/no to simple questions appropriately. Clonidine taper continued.?PRN Oxycodone administered x2 w/good effect. PRN Dilaudid administered x2 w/good effect. PRN Ativan?administered x1 w/little effect for agitation. Bilat soft wrist restraints remain in place. Afebrile. NSR/ST 100-110's; 130-140's when agitated. SBP's 140-160's; 180-200's when agitated. Heparin gtt continued. R Chest Port remains in place. Daily labs obtained. Pt trached w/#8 Bovina @ 14. Pt on CPAP 9-5. Pt suctioned frequently (Q74min) for thin/thick, tan/brown secretions. LS diminished. NPO. PEG tube remains in place, TF, Nepro, infusing @ Goal of 65cc/hr. 200cc FWF continued Q4hr. Pt DTV @ 2100 post foley removal; bladder scanned for 900cc; straight cathed x1. Pt DTV @ 0400; bladder scanned for 650- straight cathed x1. No BM this shift.?Blood Glucose covered per sliding scale. Arthur [name], RN3/11/20226:24 AM

## 2020-11-13 NOTE — Plan of Care
Plan of Care Overview/ Patient Status    0700-1900Patient continues on propofol. Opening eyes spontaneously, tracking but not following this morning. He is moving all extremities but not to command, mostly when he gets agitated he starts pulling on restraints or moving legs. Tears rolling down his cheeks at one point, emotional support/reassurance provided. PERRLA. Pain meds per CPOT with +effect. SR in the 80's-90's. MAP goal >65, levo as ordered. Continues with low grade temps, tylenol and ice packs as needed. Antibiotics continued. #8 bivona in placed, slightly dislodged about 2 cm when turning him this am, re-advanced by attending, pt did not desaturated on loose his volumes. ENT at bedside afterwards. Suctioning about Q1-1 1/2 hours, moderate amounts of secretions, white creamy and tan at times, slightly improved from yesterday. Coarse lung sounds. On CPAP for 6 1/2  hrs. Abdomen soft, +BS. 1 small loose BM. Tolerating TF. Minimal to no residuals. Foley patent, adequate amounts of clear yellow urine. 1L LR bolus given. Insulin gtt stopped and started on HDSS. Turned and repositioned as tolerated. Safety precautions in place.

## 2020-11-13 NOTE — Progress Notes
Spoke with Idamae Schuller PA- he is going to communicate with Dr Donley Redder hospitalist attending and the charge nurse on 10 7 when patient is ready for transfer out of SICU.

## 2020-11-13 NOTE — Plan of Care
Plan of Care Overview/ Patient Status    0700-1500Patient alert, intermittently restless with episodes of anxiety. Scheduled clonazepam given. When anxious pt becomes tachypneic, tachy and hypertensive, improved when calm. Not following commands but MAE. PERRLA. Pain regimen continued with +effect. He is SR-ST mostly in the 90's-low 100's when calm and up to 140's during anxiety/agitation episodes. Maintaining MAP goal >60. SBP in the 150's. Afebrile. Heparin gtt continued, therapeutic. No s/s of active bleeding. bivona trach in place, minimal suctioning required ~4 hrs for small amounts of thick yellow secretions. He coughs up and brings most of the secretions up. Oral suctioning for moderate amounts of clear/white secretions. On CPAP settings, increased to 12/5 this am after he became tachypneic, tachycardic and restless, with improvement. Abdomen soft, +BS. PEG is patent, minimal residual. Tolerating TF at goal . FWF as ordered. Foley placed back in this am for retention and straight cath x2 prior shift. Adequate amounts of clear yellow urine. MAD to buttocks, cream applied and turned and repositioned as tolerated. Safety precautions maintained.

## 2020-11-13 NOTE — Progress Notes
[facility]	SICU Progress NoteAttending Provider: Alric Quan, MDPost-Operative Day/Post Injury Day Procedure(s):2/5: [name] exchange (ENT) Hospital LOS: 35 days Interim History:  HPI: 55 y.o. male admitted to the SICU on 2/9 for ventilator management following an RRT on the oncology floor for desaturation and increased work of breathing while on trach mask and ultimately placed back on vent. ?Patient was initially a transfer from [facility] on 2/4 after trach and PEG when he had a partial obstruction of trach site from a new necrotic mass. He underwent trach exchange 2/5. Was on trach mask on oncology floor however had 2 episodes on floor requiring RRT for lavage and likely mucous plugging. ?Course Complicated by: - Incidental R IJ thrombus - Multiple episodes of presumed mucous plugging requiring RRT's ?PMH: Chronic ITP, Waldenstrom macroglobulinemia, HTN. HLD, DM, tobacco and alcohol use, Laryngeal SCC (weekly Cisplatin and RT)?PSH: None known ?Home meds: Lipitor, Pepcid, Lisinopril, Metformin, Oxycodone, miralax, Insulin U100 and Lantus SICU course: (see progress note from 3/6 for previous days events)3/6: PSV 10/5 as tolerated today, rest on PCV. Discussed hyperkalemia with pharmacy, OK for further lokelma doses but since patient not having BMs, likely not very effective. Will give dose of senna, consider standing bowel regimen tomorrow. Episode of acute agitation/tachycardia to 140s requiring increase in precedex gtt with good effect, minimal secretions. PM K+ 5.5(5.8) --> lokelma 10g. O/N: Lactulose x 1, no BM. K down to 5.0. Rested on PCV.3/7: Attempted PSV 8/5, immediately became tachypneic to mid 40's, HR to 140's; Rested on PCV x 1 hour then returned to PSV 10/5, will continue as tolerated. Clonidine taper started, PRN Ativan increased to 1mg  Q4h, attempting to wean Precedex. Heparin gtt started for known IJ thrombus. ON: Remained on 10/5. 3/8: 10/5 as tolerated. Increased secretions, CXR with L sided consolidation / effusion, though no effusion seen on bedside ultrasound. Low grade temp, plan to reculture if spikes temp or worsening oxygenation. Bicarb tabs d/c'ed, FWF Q4h added. Lantus increased to 25u BID in attempt to wean insulin gtt. 6:30pm: Noted to have trach dislodged, visible distress and desatting to teens; trach successfully replaced, CXR pending, ENT aware. Placed on PCV for rest. ON: CXR with trach in stable position. Placed back on PSV 10/5; tolerating well. Tmax 100.9, afebrile majority of night. 3/9: Concern for trach balloon not holding, trach exchanged by ENT and now appropriately secured. D5W @ 71mL/hr added for worsening hypernatremia. No BM x several days, MoM added. Haldol d/c'ed given no effect and minimal ativan use, continue PRN ativan for now. ON: Required additional 0.5 ativan x1 for acute agitation resulting in tachycardia and desat on PSV. 3/10: Intermittently anxious, agitated, but maintaining sPO2. Will trial PSV 9/5 later today. Relatively stable 24hr insulin requirements over last few days --> increase lantus to 40 BID with plan to transition off insulin gtt tomorrow. HyperNa improved, will stop D5W today. Give dulcolax suppository and trial d/c foley today. ON: Tolerating PSV 9/5. Unable to void spontaneously. Bladder scanned twice with residuals of 900 and 450 cc --> straight cathed x2. 3/11: will replace foley if still unable to void. Appears a bit more tachypneic (high 20s) with lower TVs (300s); had to increase PSV to 12/5. Originally accepted for 10-7 transfer again (d/w Dr. Irven Easterly, hospitalist attending), but would given q30 minutes secretions documented overnight, will watch for another 24hrs to observe requirements. He has required ~q4hrs today.Review of Allergies/Meds/Hx: I have reviewed the patient's: allergies, past medical history, past surgical history, family history, social history,  prior to admission medicationsObjective: Vitals:I have reviewed the patient's current vital signs as documented in the EMRTemp:  [99 ?F (37.2 ?C)-100.76 ?F (38.2 ?C)] 99 ?F (37.2 ?C)Pulse:  [92-123] 98Resp:  [17-27] 19BP: (98-199)/(44-93) 141/68SpO2:  [96 %-100 %] 100 %SpO2: 100 %Device (Oxygen Therapy): mechanical ventilatorI/O's:I have reviewed the patient's current I&O's as documented in the EMR.Gross Totals (Last 24 hours) at 11/13/2020 0005Last data filed at 11/12/2020 2300Intake 2756.15 ml Output 2475 ml Net 281.15 ml Physical ExamConstitutional: Middle aged trached man in NADHEENT: NCAT. PERRL, EOMI. External ears normal. Bivona trach midline with xeroform distal skinCardiovascular: RRR, no MRG appreciated. Distal pulses are 2+ bilaterally.Pulmonary: Trached on vent. Tachypneac at times. Suctioned for small amount of thin secretions.Abdominal: Soft, obese, non distended, non tender to palpation, +BS. PEG Tube site intactGenitourinary: Foley catheter in place, draining clear yellow urine.Musculoskeletal: BUE strength 4/5, BLE 2/5Neurological: Intermittent opens eyes, nods head, FSC. Agitated at timesSkin: Warm and dry.Psychiatric: Unable to assessLabs: Refer to EMR. I have reviewed the patient's labs within the last 24 hours; see assessment and plan below for significant abnormals.Microbiology:Refer to EMR. I have reviewed all new results within the last 24 hours; see assessment and plan below.Diagnostics:Refer to EMR. I have reviewed all new results within the last 24 hours; see assessment and plan below.Chest ZO:XWRUE to EMR. I have reviewed all new results within the last 24 hours; see assessment and plan below.Abdominal AV:WUJWJ to EMR. I have reviewed all new results within the last 24 hours; see assessment and plan below.ECG/Tele Events: I have reviewed the patient's ECG and telemetry as resulted in the EMR.Assessment/Plan Neurologic: Analgesia-?Tylenol 650 Q6 PRN - Oxycodone 10 / 15 Q4 PRN - Dilaudid 1 PRN breakthrough?Anxiety / Depression- Clonidine taper given prolonged precedex used- Clonazepam 0.5mg  BID- Ativan 1mg  IV Q6h PRN; could consider increasing to q4 PRN- Appreciate palliative care and psych consults?Cardiac: Hx of?HTN / HLD- Hold home lisinopril, lasix; resume when appropriate?- Home statin?Respiratory:??Respiratory failure 2/2 circumferential tumor invasion of stoma, mucous plugging / trach occlusion- PSV?12?/ 5 as tolerated, continue to adjust Pi as tolerated-?Rest on?PCV IP 12, +5?PRN- S/p Decadron 8mg  BID for tumor inflammation (2/22 - 2/24) per med oncology- Albuterol Q6h?PRN?ENT:?L laryngeal SCC invading tracheostomy; s/p trach exchange (2/5) and biopsy (2/7); trach exchange (3/9)- #8 hyperflex Bivona trach to bypass obstruction; keep spare at bedside - ENT planning for custom bivona trach, ongoing discussions with insurance company for approval - Unfortunately, there are no surgical / curative options- S/p palliative radiation (2/16, 2/17, 2/18, 2/21, 2/22, 2/23)- Appreciate med onc input, no current recommendations, signed off; reconsult when ready for transfer to floor (will go under med onc service) or if needed to assist with GOC discussions?GI: Protein-calorie malnutrition- TF at goal via PEG (Nepro 2/2 hyperkalemia)- FWF as below while on concentrated formula- Bowel regimen?Constipation (likely opioid induced)- Bowel regimen- Try dulcolax suppository again?PUD prophylaxis- Pepcid while on the ventilator?Renal:?AKI on CKD (baseline Cr 1.5-1.6), 2/2 supratherapeutic vancomycin level-?Creat stable ~2, continue to trend- Foley replaced 2/17 and 3/11 for urinary retentionHypernatremia- FWF Q4h?Hyperkalemia, resolved- Medical management as needed - Nepro TF as above?Infectious Disease:??MRSA pneumonia, resolved- S/p 10 day course of vanco --> doxy (2/24 - 3/5)- Reculture if febrile / worsening oxygenation given increased suctioning requirements?Hematology:Critical illness anemia- Sanguinous secretions, expected given tumor burden and coagulopathy, may worsen with radiation and baseline hematologic disease- Multivitamins and cofactors- Maintain Hgb >7?R IJ thrombus / DVT prophylaxis- High risk heparin gtt - Maintain SCDs at all times?Hx of?chronic ITP, Waldenstrom macroglobulinemia- Transfuse platelets if <30k,  bleeding, or need for invasive procedure per heme- No need to treat ITP unless plts <30k- Appreciate hematology recommendations?Endocrine: Hyperglycemia / Hx T2DM- s/p Insulin gtt (2/28 - 3/11)- Lantus?40?units to BID- HDISS protocol- HOLD home Metformin, Lantus 64u, Apidra 20u TID w meals- Appreciate diabetes team recs, signed off for now, reconsult when appropriate?Musculoskeletal: Generalized deconditioning- OOB daily, PT/OT evals- Turn and reposition Q2 hours, PUP dressing?Device Review:- R IJ Port (08/12/20) per guidelines, nonocclusive DVT does not require removal of line- PEG (1/10)- Foley (2/17)- #8 hyperflex Bivona trach (3/9)?Code Status / Dispo:- Transition to DNR (see ACP note 2/25)- Plan for transfer to med onc service when ready for floor (med onc in agreement, have signed off while in SICU; reconsult when ready for floor / if needed for GOC discussions)- Patient stated his goals of care would be to complete XRT (done) and eventually be considered for outpatient chemotherapy offered by medical oncology- Accepted for 10-7 bed pending improvements in secretions- Appreciate palliative care assistance with GOC discussionsNotifications : For questions please call SICU Covering Provider in Epic EMR (primary) SICU [facility] Dynamic Roles (secondary)[facility] 7-1 APP			[phone] 6-1 Frontside		[phone] 6-1 Backside		[phone] Verdi-2 APP			[phone]:Pierrette Scheu, PA-CSICU APPMHB [phone]/07/2021

## 2020-11-13 NOTE — Progress Notes
Otolaryngology Progress NoteDate: 11/13/2020 Interim History- awaiting custom trach deliveryVitalsTemp:  [37.2 ?C-37.7 ?C] 37.6 ?CPulse:  [86-142] 105Resp:  [17-43] 23BP: (99-190)/(44-92) 159/78SpO2:  [96 %-100 %] 98 %Device (Oxygen Therapy): mechanical ventilator I/O last 3 completed shifts:In: 3057.4 [I.V.:557.4; NG/GT:2500]Out: 2945 [Urine:2945]Physical ExamGen: alert, vented PCEyes: open, normal external appearanceFace: grossly symmetricNose: clear anteriorlyOP/OC: hemostaticNeck: Bivona 8 cuffed secured w/soft trach ties, necrotic but hemostatic mass surrounding the stoma, remaining neck flat, firm tumor Pulm: no increased WOB on TMDiagnostic DataReviewed, per chartAssessment & Plan54 M with PMH of HTN, HLD, DM, tobacco and EtOH use, Waldenstrom macroglobulinemia, 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 Admitted to SICU for ventilatory support. No acute ENT intervention planned; no curative or meaningful surgical options at this time. Course complicated by multiple episodes of Bivona trach occlusion/obstruction.  - custom Bivona trach ordered- no ENT intervention planned at this time; case discussed at tumor board 2/14 and is not a surgical candidate - disease management/goals-of-care best pursued via non-surgical means; eg, Medical Oncology, Radiation Oncology, Palliative Care- continue routine trach care with careful suctioning- ensure trach well secured with ties- rest of care per Laban Emperor, MD (PGY-1)

## 2020-11-13 NOTE — Progress Notes
Surgical Intensive Care Unit  AttendingI have personally performed a face to face diagnostic evaluation on this patient, reviewed the chart, available data and care plan and have personally formulated the plan outlined below. I have seen this patient in addition to the Surgical ICU advanced practitioner  given patient's complexity and associated need for critical care. 55 y.o. 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 Hospital Day 35 Past Medical History: Diagnosis Date ? Diabetes mellitus (HC Code) (HC CODE)  ? Hypercholesteremia  ? Hypertension  No past surgical history on file.Scheduled Meds:Current Facility-Administered Medications Medication Dose Route Frequency Provider Last Rate Last Admin ? ascorbic acid (vitamin C) (VITAMIN C) tablet 250 mg  250 mg Per G Tube Daily [place], [place]   250 mg at 11/13/20 0818 ? chlorhexidine gluconate (PERIDEX) 0.12 % solution 15 mL  15 mL Mouth/Throat Q12H Leonia Reeves, PA   15 mL at 11/13/20 0818 ? clonazePAM (KlonoPIN) tablet 0.5 mg  0.5 mg Per J Tube BID Mat Carne, APRN   0.5 mg at 11/13/20 0818 ? cloNIDine HCL (CATAPRES) tablet 0.2 mg  0.2 mg Per G Tube Q12H Madlener, Judeth Cornfield, PA      Followed by ? Melene Muller ON 11/16/2020] cloNIDine HCL (CATAPRES) tablet 0.2 mg  0.2 mg Per G Tube Q24H Madlener, Judeth Cornfield, PA     ? famotidine (PEPCID) tablet 20 mg  20 mg Per J Tube Daily Cusick, Lauren, PA   20 mg at 11/13/20 0818 ? folic acid (FOLVITE) tablet 1 mg  1 mg Per G Tube Daily [place], [place], [place]   1 mg at 11/13/20 0818 ? insulin glargine (Semglee,Lantus) injection 40 Units  40 Units Subcutaneous Q12H Bettey Costa, [place]   40 Units at 11/13/20 0818 ? insulin regular human (HumuLIN R, NovoLIN R) Sliding Scale (See admin instructions for dose)   Subcutaneous Q6H Lisette Abu, PA   1 Units at 11/13/20 2130 ? magnesium hydroxide (MILK OF MAGNESIA) 400 mg/5 mL suspension 30 mL  30 mL Per G Tube Daily Alyson Reedy, PA   30 mL at 11/13/20 0818 ? miconazole nitrate (SECURA EXTRA THICK ANTIFUNGAL) 2 % cream   Topical (Top) BID Lisette Abu, [place]   Given at 11/13/20 0818 ? midazolam (PF) (VERSED) 1 mg/mL injection          ? polyethylene glycol (MIRALAX) packet 17 g  17 g Per G Tube BID Bettey Costa, [place]   17 g at 11/13/20 0818 ? Prosource no carb 15 gram protein and 60 kcal  1 packet Per G Tube TID Bettey Costa, PA   1 packet at 11/13/20 0818 ? rosuvastatin (CRESTOR) tablet 10 mg  10 mg Per G Tube Daily Bettey Costa, PA   10 mg at 11/13/20 0818 ? senna (SENOKOT) tablet 8.6 mg  1 tablet Per G Tube BID Gearldine Shown, PA   8.6 mg at 11/13/20 0818 ? thiamine (VITAMIN B1) tablet 50 mg  50 mg Per G Tube Daily Jennye Boroughs, PA   50 mg at 11/13/20 0818 Continuous Infusions:? heparin 25,000 units/250 ml infusion - High Risk of Bleeding 16 Units/kg/hr (11/13/20 0800) PRN Meds: acetaminophen, albuterol, bisacodyL, carboxymethylcellulose, glucagon, HYDROmorphone, LORazepam, oxyCODONE, oxyCODONE, zinc oxideVITALS:Temp (24hrs), Avg:99.7 ?F (37.6 ?C), Min:98.9 ?F (37.2 ?C), Max:100.76 ?F (38.2 ?C)Vitals:  11/13/20 0600 11/13/20 0700 11/13/20 0800 11/13/20 0811 BP: (!) 122/56 (!) 153/86 (!) 155/87 (!) 155/87 Pulse: (!) 96 (!) 108 (!) 103 (!) 142 Resp: 20 (!) 24 19 (!)  39 Temp:   99.86 ?F (37.7 ?C)  TempSrc:   Bladder  SpO2: 100%  99% 99% Weight:     Height:     Intake/Output Summary (Last 24 hours) at 11/13/2020 0914Last data filed at 11/13/2020 0800Gross per 24 hour Intake 2901.94 ml Output 2860 ml Net 41.94 ml Physical ExamAs belowLabs Last 24 hours:Most Recent Result Component Value Date/Time  Sodium 143 11/13/2020 05:04 AM  Potassium 4.5 11/13/2020 05:04 AM  Chloride 108 (H) 11/13/2020 05:04 AM  CO2 28 11/13/2020 05:04 AM  BUN 71 (H) 11/13/2020 05:04 AM  Creatinine 1.95 (H) 11/13/2020 05:04 AM  WBC 11.0 11/13/2020 06:15 AM  Hemoglobin 7.7 (L) 11/13/2020 06:15 AM  Hematocrit 25.60 (L) 11/13/2020 06:15 AM  Platelets 153 11/13/2020 06:15 AM  PTT 50.8 (H) 11/13/2020 05:04 AM Imaging Last 24 hours:No results found.Assessment / Plan:Neurologic:  Awake, following simple commands. Anxious, restless at times.Pain is being controlled with the current regimenOn clonidine taper and AtivanCardiac: Heart rate (regular) and blood pressure are in acceptable range.Has intermittent tachycardia and hypertension with anxiety. Respiratory: Tracheostomy replaced 3/9 by ENT.Respiratory failure requiring mechanical ventilation. On PS increased back to 12 due to lower resp volumes. PEEP 5 FiO2 40%Infectious Disease/Sepsis: Afebrile, WBC10.9Currently not on antibioticsRenal, Fluids/Electrolytes: Appropriate urine output.Acute kidney injury. Creatine stable  Na stable at 143.  Foley catheter replaced due to persistent urinary retention.  GI/Nutrition: Protein-calorie malnutrition -  On tube feeds.Hematology:Acute blood loss anemia. Hgb 7.7 No acute indication for transfusion.     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 On Heparin drip. Endocrine:  Insulin protocol for hyperglycemiaLantusMusculoskeletal:  PTSkin/wound: Frequent repositioning to mitigate against pressure ulcerationGoals of Care/Advance Directives:Code status:   DNRProphylaxis:  As abovePlan discussed with:  Primary Service, Critical Care Nursing, Respiratory TherapyThis patient is critically ill as indicated by the following Acute Respiratory FailureThe patient has required 33 minutes of my undivided attention during the past 24 hours for one of the previous mentioned life threatening diagnoses. The critical care interventions have included: Management of Acute Respiratory Failure with adjustments to ventilator settings/mode to maintain adequate ventilation / oxygenation  and initiation of spontaneous breathing trials Wendee Copp, MD SICU Attending

## 2020-11-14 ENCOUNTER — Inpatient Hospital Stay: Admit: 2020-11-14 | Payer: PRIVATE HEALTH INSURANCE

## 2020-11-14 DIAGNOSIS — C329 Malignant neoplasm of larynx, unspecified: Secondary | ICD-10-CM

## 2020-11-14 DIAGNOSIS — J9601 Acute respiratory failure with hypoxia: Secondary | ICD-10-CM

## 2020-11-14 LAB — PARTIAL THROMBOPLASTIN TIME     (BH GH LMW Q YH): BKR PARTIAL THROMBOPLASTIN TIME: 39.9 s — ABNORMAL HIGH (ref 23.0–31.4)

## 2020-11-14 LAB — CBC WITH AUTO DIFFERENTIAL
BKR WAM ABSOLUTE IMMATURE GRANULOCYTES.: 0.16 x 1000/ÂµL (ref 0.00–0.30)
BKR WAM ABSOLUTE LYMPHOCYTE COUNT.: 0.29 x 1000/ÂµL — ABNORMAL LOW (ref 0.60–3.70)
BKR WAM ABSOLUTE NRBC (2 DEC): 0 x 1000/ÂµL (ref 0.00–1.00)
BKR WAM ANALYZER ANC: 7.74 x 1000/ÂµL — ABNORMAL HIGH (ref 2.00–7.60)
BKR WAM BASOPHIL ABSOLUTE COUNT.: 0.04 x 1000/ÂµL (ref 0.00–1.00)
BKR WAM BASOPHILS: 0.4 % (ref 0.0–1.4)
BKR WAM EOSINOPHIL ABSOLUTE COUNT.: 0.65 x 1000/ÂµL (ref 0.00–1.00)
BKR WAM EOSINOPHILS: 6.4 % — ABNORMAL HIGH (ref 0.0–5.0)
BKR WAM HEMATOCRIT (2 DEC): 26.7 % — ABNORMAL LOW (ref 38.50–50.00)
BKR WAM HEMOGLOBIN: 8.2 g/dL — ABNORMAL LOW (ref 13.2–17.1)
BKR WAM IMMATURE GRANULOCYTES: 1.6 % — ABNORMAL HIGH (ref 0.0–1.0)
BKR WAM LYMPHOCYTES: 2.8 % — ABNORMAL LOW (ref 17.0–50.0)
BKR WAM MCH (PG): 27.8 pg (ref 27.0–33.0)
BKR WAM MCHC: 30.7 g/dL — ABNORMAL LOW (ref 31.0–36.0)
BKR WAM MCV: 90.5 fL (ref 80.0–100.0)
BKR WAM MONOCYTE ABSOLUTE COUNT.: 1.32 x 1000/ÂµL — ABNORMAL HIGH (ref 0.00–1.00)
BKR WAM MONOCYTES: 12.9 % — ABNORMAL HIGH (ref 4.0–12.0)
BKR WAM MPV: 12.2 fL — ABNORMAL HIGH (ref 8.0–12.0)
BKR WAM NEUTROPHILS: 75.9 % — ABNORMAL HIGH (ref 39.0–72.0)
BKR WAM NUCLEATED RED BLOOD CELLS: 0.2 % (ref 0.0–1.0)
BKR WAM PLATELETS: 172 x1000/ÂµL (ref 150–420)
BKR WAM RDW-CV: 17.3 % — ABNORMAL HIGH (ref 11.0–15.0)
BKR WAM RED BLOOD CELL COUNT.: 2.95 M/ÂµL — ABNORMAL LOW (ref 4.00–6.00)
BKR WAM WHITE BLOOD CELL COUNT: 10.2 x1000/ÂµL (ref 4.0–11.0)

## 2020-11-14 LAB — BASIC METABOLIC PANEL
BKR ANION GAP: 10 (ref 7–17)
BKR BLOOD UREA NITROGEN: 72 mg/dL — ABNORMAL HIGH (ref 6–20)
BKR BUN / CREAT RATIO: 37.3 — ABNORMAL HIGH (ref 8.0–23.0)
BKR CALCIUM: 10.1 mg/dL (ref 8.8–10.2)
BKR CHLORIDE: 108 mmol/L — ABNORMAL HIGH (ref 98–107)
BKR CO2: 27 mmol/L (ref 20–30)
BKR CREATININE: 1.93 mg/dL — ABNORMAL HIGH (ref 0.40–1.30)
BKR EGFR (AFR AMER): 44 mL/min/{1.73_m2} (ref >60)
BKR EGFR (NON AFRICAN AMERICAN): 36 mL/min/{1.73_m2} (ref >60)
BKR GLUCOSE: 209 mg/dL — ABNORMAL HIGH (ref 70–100)
BKR POTASSIUM: 4.7 mmol/L (ref 3.3–5.3)
BKR SODIUM: 145 mmol/L — ABNORMAL HIGH (ref 136–144)

## 2020-11-14 LAB — NT-PROBNPE: BKR B-TYPE NATRIURETIC PEPTIDE, PRO (PROBNP): 1988 pg/mL — ABNORMAL HIGH (ref <125.0)

## 2020-11-14 LAB — PHOSPHORUS     (BH GH L LMW YH): BKR PHOSPHORUS: 4 mg/dL (ref 2.2–4.5)

## 2020-11-14 LAB — MAGNESIUM: BKR MAGNESIUM: 2.5 mg/dL — ABNORMAL HIGH (ref 1.7–2.4)

## 2020-11-14 LAB — PROCALCITONIN     (BH GH LMW Q YH): BKR PROCALCITONIN: 0.92 ng/mL — ABNORMAL HIGH

## 2020-11-14 MED ORDER — FUROSEMIDE 40 MG TABLET
40 mg | Freq: Every day | ORAL | Status: DC
Start: 2020-11-14 — End: 2020-11-17
  Administered 2020-11-15 – 2020-11-17 (×3): 40 mg via ORAL

## 2020-11-14 MED ORDER — LABETALOL 5 MG/ML INTRAVENOUS SOLUTION
5 mg/mL | Freq: Once | INTRAVENOUS | Status: CP
Start: 2020-11-14 — End: ?
  Administered 2020-11-15: 04:00:00 5 mL via INTRAVENOUS

## 2020-11-14 MED ORDER — LORAZEPAM 2 MG/ML INJECTION SOLUTION
2 mg/mL | Freq: Once | INTRAVENOUS | Status: CP
Start: 2020-11-14 — End: ?
  Administered 2020-11-14: 11:00:00 2 mL via INTRAVENOUS

## 2020-11-14 MED ORDER — BISACODYL 10 MG RECTAL SUPPOSITORY
10 mg | Freq: Once | RECTAL | Status: CP
Start: 2020-11-14 — End: ?
  Administered 2020-11-15: 02:00:00 10 mg via RECTAL

## 2020-11-14 MED ORDER — SENNOSIDES 8.6 MG TABLET
8.6 mg | Freq: Two times a day (BID) | GASTROSTOMY | Status: DC
Start: 2020-11-14 — End: 2020-11-23
  Administered 2020-11-15 – 2020-11-22 (×12): 8.6 mg via GASTROSTOMY

## 2020-11-14 MED ORDER — LABETALOL 5 MG/ML INTRAVENOUS SOLUTION
5 mg/mL | Status: DC
Start: 2020-11-14 — End: 2020-11-20

## 2020-11-14 NOTE — Progress Notes
ENT brief note:Patient transferred out of SICU to 10-7 for further care. Please alert ENT of any further trach or surgical issues. Custom Bivona trach ordered - we will place it once it arrives. Would consider re-consulting Oncology/Palliative Care for further GOC discussion in the coming days. Please call  [phone] with ENT questions/concerns pertaining to this patient. Signed:Adael Culbreath SawanYale Otolaryngology-Head and Neck Surgery, PGY-53/12/2022Inpatient Consults: [phone] Consults: [phone]

## 2020-11-14 NOTE — Transfer Summaries
SICU/SDU Transfer Note:Procedure or Diagnosis/Date:2/5: Aeronautical engineer (ENT) HPI:?54 y.o.?male?admitted to the SICU on 2/9?for?ventilator management?following?an RRT on the oncology floor for desaturation and increased work of breathing while on trach mask and ultimately placed back on vent. ?Patient was initially a transfer from [facility] on 2/4 after trach and PEG when he had a partial obstruction of trach site from a new necrotic mass. He underwent trach exchange 2/5.?Was on trach mask on oncology floor however had 2 episodes on floor requiring RRT for lavage and likely mucous plugging.??Course Complicated by: - Incidental R IJ thrombus - Multiple episodes of presumed mucous plugging requiring RRT's?- Difficulty weaning from the vent?PMH:?Chronic ITP, Waldenstrom macroglobulinemia, HTN. HLD, DM, tobacco and alcohol use, Laryngeal SCC (weekly Cisplatin and RT)?PSH:?None known??Home meds:?Lipitor, Pepcid, Lisinopril, Metformin, Oxycodone, miralax, Insulin U100 and Lantus?3/11: will replace foley if still unable to void. Appears a bit more tachypneic (high 20s) with lower TVs (300s); had to increase PSV to 12/5. Originally accepted for 10-7 transfer again (d/w Dr. Irven Easterly, hospitalist attending), but would given q30 minutes secretions documented overnight, will watch for another 24hrs to observe requirements. He has required ~q4hrs today(SEE 3/11 Note for previous days events)Meds/Plan:Neurologic: Analgesia-?Tylenol 650 Q6 PRN - Oxycodone 10 / 15 Q4 PRN - Dilaudid 1 PRN breakthrough?Anxiety / Depression- Clonidine taper?given prolonged precedex used- Clonazepam 0.5mg  BID- Ativan 1mg  IV Q6h PRN; could consider increasing to q4 PRN- Appreciate palliative care and psych consults?Cardiac: Hx of?HTN / HLD- Hold home lisinopril, lasix; resume when appropriate?- Home statin?Respiratory:??Respiratory failure 2/2 circumferential tumor invasion of stoma, mucous plugging / trach occlusion- PSV?12?/ 5 as tolerated, continue to adjust Pi as tolerated-?Rest on?PCV IP 12, +5?PRN- S/p Decadron 8mg  BID for tumor inflammation (2/22 - 2/24) per med oncology- Albuterol Q6h?PRN?ENT:?L laryngeal SCC invading tracheostomy; s/p trach exchange (2/5) and biopsy (2/7); trach exchange (3/9)- #8 hyperflex Bivona trach to bypass obstruction; keep spare at bedside - ENT planning for custom bivona trach, ongoing discussions with insurance company for approval - Unfortunately, there are no surgical / curative options- S/p palliative radiation (2/16, 2/17, 2/18, 2/21, 2/22, 2/23)- Appreciate med onc input, no current recommendations, signed off; reconsult when ready for transfer to floor (will go under med onc service) or if needed to assist with GOC discussions?GI: Protein-calorie malnutrition- TF at goal via PEG (Nepro 2/2 hyperkalemia)- FWF as below while on concentrated formula- Bowel regimen?Constipation (likely opioid induced)- Bowel regimen- Try dulcolax suppository?again?PUD prophylaxis- Pepcid while?on the ventilator?Renal:?AKI on CKD (baseline Cr 1.5-1.6), 2/2 supratherapeutic vancomycin level-?Creat stable ~2, continue to trend- Foley replaced 2/17 and 3/11 for urinary retention?Hypernatremia- FWF Q4h?Hyperkalemia, resolved- Medical management as needed - Nepro TF as above?Infectious Disease:??MRSA pneumonia, resolved- S/p 10 day course of vanco --> doxy (2/24 - 3/5)- Reculture if febrile / worsening oxygenation given increased suctioning requirements?Hematology:Critical illness anemia- Sanguinous secretions, expected given tumor burden and coagulopathy, may worsen with radiation and baseline hematologic disease- Multivitamins and cofactors- Maintain Hgb >7?R IJ thrombus / DVT prophylaxis- High risk heparin gtt - Maintain SCDs at all times?Hx of?chronic ITP, Waldenstrom macroglobulinemia- Transfuse platelets if <30k, bleeding, or need for invasive procedure per heme- No need to treat ITP unless plts <30k- Appreciate hematology recommendations?Endocrine: Hyperglycemia / Hx T2DM- s/p Insulin gtt (2/28 - 3/11)-?Lantus?40?units?to?BID- HDISS protocol- HOLD home Metformin, Lantus 64u, Apidra 20u TID w meals- Appreciate diabetes team recs, signed off for now, reconsult when appropriate?Musculoskeletal: Generalized deconditioning- OOB daily, PT/OT evals- Turn and reposition Q2 hours, PUP dressing?Device Review:- R IJ Port (08/12/20) per guidelines, nonocclusive DVT does  not require removal of line- PEG (1/10)- Foley (2/17)- #8 hyperflex Bivona trach (3/9)?Code Status / Dispo:- Transition to DNR (see ACP note 2/25)- Plan for transfer to med onc service when ready for floor (med onc in agreement, have signed off while in SICU; reconsult when ready for floor / if needed for GOC discussions)- Patient stated his goals of care would be to complete XRT (done) and eventually be considered for outpatient chemotherapy offered by medical oncology- Accepted for 10-7 - Appreciate palliative care assistance with GOC discussionsBased on the information above, the patient is stable for transfer.SICU Resident/[phone] Lutricia Feil PA-C

## 2020-11-14 NOTE — Other
-    CONSULT  REQUEST  DOCUMENTATION  -  [facility] NOTE  -  Type of consult: [facility] Pulmonology   -  New Consult: [id number] [name] /Location: 830/830-B / Brief Clinical Question: vent weaning recs/Callback Cell Phone: [phone] / Please confirm receipt of this message by texting back ?OK?  -  1 - Mobile Heartbeat message sent to [place], J at 12:26 PM. Received response at 12:26.  Trudee Grip IV, PCT  11/14/2020  12:26 PM  Consult [facility] [phone]

## 2020-11-14 NOTE — Plan of Care
Plan of Care Overview/ Patient Status    Pt drowsy, occasionally agitated/restless due to medication timing. MAE, PERRL. Following commands intermittently. Pain adequately managed w/ tylenol, oxycodone, dilaudid PRN. HR 100s-110s, 130s while agitated. Normotensive. Afebrile. Heparin gtt therapeutic. All pulses palpable. LS coarse, Bivona #8 in place, maintaining adequate oxygenation on CPAP 12/5, 40%. Abdomen obese, soft, non-tender. No BM this shift. Tolerating TF at goal, Nepro 65cc/hr thru PEG. FC w/ adequate amounts of CYU. Updated pt on POC- pt is a poor candidate for education. All care explained. Electronically Signed by Lavenia Atlas, RN, March 12, 20222:30 AMTransferred to EP 10-7 ~0010, all belongings w/ patient upon leaving SICU 7-1.

## 2020-11-14 NOTE — Plan of Care
Plan of Care Overview/ Patient Status    0700-1500:Neuro: orientation UTA. Unable to speak. Very anxious PRN's given, with poor effect, settles for a bit but will have periods of restlessness again. CV: Sinus tach on tele  100-120's. Can get up to 130-140's When in agitated state. Vasc: +distal pulses intact & bilat equal. +3 edema to hands, wrists, and neck. Resp: Bivona #8. Episodes of heavy bellying breathing. Tachypneic. RR up to 42 at times, remaines in 20's most of time. Stating still above 90%. Multiple suctioning. RT called to bedside for adjustments to vent settings. Now resting on AC settings.GI/GU: TF Nepro full strength at goal 65 mL/hr. PEG site intact no reddness/warmth/ swelling. Last documented BM 3/3. Bowel regimen given and abdominal x-ray ordered. Foley in place draining adequate amounts of yellow urine Mobility: Bedfast. Ax2-3 to T&RSkin: Dermatitis buttocks, barrier cream applied. Wound consult for neck area, xeroform applied and foul odor to area. Safety: BL soft wrist restrains in place. 1200: Provider called to bedside for evaluation of pt's agitated state with heavy abdominal breathing, RR 40-42. HTN to 180's systolic able to get self down after couple minutes. See flowsheet for more details

## 2020-11-14 NOTE — Progress Notes
[facility]    [facility] Medicine Progress Note  Attending Provider: Irven Easterly, MD [phone]    Hospital Day: 36   Consultants: pulm  Subjective:   2 episodes of RR in 40s this morning with hypertension SBP 208, resolved spontaneously.  Suctioned for large mucous ball in afternoon    Unable to obtain meaningful ROS  He was able to squeeze my hand when asked but otherwise not interactive  Objective:   Vitals:  Temp:  [98 ?F (36.7 ?C)-100.04 ?F (37.8 ?C)] 98 ?F (36.7 ?C)  Pulse:  [86-119] 114  Resp:  [20-33] 22  BP: (114-185)/(70-91) 185/83  SpO2:  [95 %-100 %] 95 %  Device (Oxygen Therapy): mechanical ventilator  O2 Flow (L/min):  [8] 8   Weight: (!) 147.9 kg    I/O's: I have reviewed I&Os as documented by nursing staff  Labs: I have reviewed all relevant labs.  Procal 0.92  BNP 2K    Physical Exam  Constitutional:       Appearance: He is ill-appearing.      Comments: necrotic   HENT:      Head: Normocephalic.   Neck:      Comments: Necrotic peri-stomal mass. No bleeding. Firm mass R neck  Cardiovascular:      Rate and Rhythm: Normal rate and regular rhythm.      Heart sounds: Normal heart sounds.   Pulmonary:      Comments: Coarse vented breath sounds anteriorly  Abdominal:      General: Bowel sounds are normal. There is no distension.      Palpations: Abdomen is soft.      Tenderness: There is no abdominal tenderness.      Comments: g-tube without surrounding erythema or edema   Musculoskeletal:      Comments: Edematous extremities   Skin:     General: Skin is warm and dry.   Neurological:      Mental Status: He is alert.      Comments: Makes eye contact and squeezed my hand when asked. Otherwise unable to follow simple instructions.  Unable to assess orientation   Psychiatric:      Comments: Unable to asses       Diagnostics:  XR Chest PA or AP    Result Date: 11/14/2020  XR CHEST PA OR AP INDICATION: increased work of breathing in vented pt. COMPARISON: 11/11/2020. FINDINGS: Tracheostomy tube and right internal jugular Port-A-Cath are in stable position. The cardiac silhouette is within normal limits. There is similar right upper lobe and lingular airspace consolidation. Bilateral pleural effusions are unchanged. There is no pneumothorax.      No significant interval change, with similar right upper lobe and lingular airspace consolidation. Reported And Signed By: Cori Razor, MD  [facility] Radiology and Biomedical Imaging     XR Abdomen AP    Result Date: 11/14/2020  Study: XR ABDOMEN AP  Indication:  assess stool burden Comparison:  Barnett abdomen and pelvis from 10/15/2020 Gastrostomy tube projects over the left upper quadrant. There is a nonobstructive bowel gas pattern. A moderate to large amount of formed stool is noted in the rectum measuring 7 cm in diameter. A catheter projects over the rectum. Reported And Signed By: Jeralyn Bennett, MD  [facility] Radiology and Biomedical Imaging        Micro:  No results for input(s): LABBLOO, LABURIN, LOWERRESPIRA in the last 168 hours.  No results found for this  or any previous visit (from the past 168 hour(s)).  @CULTURE @    ECG/Tele Events: no new EKG today  Assessment:     Arthur [name] is a 55 y.o. male with PMH DM, tobacco and EtOH use, Waldenstrom macroglobulinemia, CKD3, laryngeal SCC diagnosed 06/2020 and managed with cisplatin + RT, s/p trach and PEG, who presented to [facility] for dyspnea. Two days prior to admission outpatient onc noted circumferential necrotic mass of trach stoma with concern for tumor encroachment. There was concern for obstruction at [facility] so he was transferred to ENT at [facility] for trach management. Scope notable for partial distal obstruction. On day 5 of admission he had respiratory distress requiring ventilator and SICU transfer. SICU course complicated by R IJ thrombus, MRSA VAP, urinary retention, and multiple RRTs for desat 2/2 trach occlusion/obstruction and mucous plugging. Transferred to pulm stepdown for vent weaning 11/13/2020.    PERTINENT TRACH INFORMATION  Service who placed trach: Other: placed at OSH.  ENT here  Date of placement:    Date of last routine trach change:  Airway 11/11/20 1100 tracheostomy-Date Tracheostomy Changed: 11/11/20   Type of trach:    #8 hyperflex cuffed Bivona    Plan:     Respiratory failure 2/2 airway obstruction from laryngeal SCC  Circumferential tumor invasion of stoma  Mucous plugging iso tumor necrosis  Trach managed by ENT  #8 hyperflex bovona to bypass obstruction. Notes indicate plans for custom bivona pending insurance approval.  - with multiple episodes of abdominothoracic breathing with rate >40 this morning on pressure AC. No desat and remained HDS.  Discussed with prior SICU team,episodes of staccato-like abdominal breathing are not new.  - trying volume AC for now and he seems more comfortable.  - pulm input for vent weaning pending    MRSA VAP  Completed 10d vanc--> doxy on 3/5    L laryngeal SCC dx 06/2020, started cisplatin and RT 1/101/2022  s/p trach/PEG  S/p radiation 2/16 - 2/23  Med onc signed off, re-consult when ready for transfer to med onc service or for assistance with GOC discussions  ENT evaluated, no curative or meaningful surgical options at this time    Constipation - last documented bowel movement 3/3  KUB with 7cm mod-large stool in rectum, nonobstructive gas pattern.  - cont miralax bid  - increase senna 2 tabs bid  - dulcolax suppository now    Hypernatremia, mild.   - FWF 215cc q4h    Urinary retention with foley  Failed voiding trials 2/17 and 3/11     AKI on CKD3 due to supratherapeutic vanc  Prior baseline Cr 1.6  Appears to be stable at new baseline ~1.9    R IJ thrombus  Chronic ITP, Waldenstrom macroglobulinemia  Heme evaluated 2/28: if thrombocytopenia worsens or lis limiting future therapies, re-consult heme for possible ITP therapy with dex or IVIG if actively bleeding.  - High risk heparin gtt   - reserve ITP-directed therapy in case of bleeding, pre-procedural, or if PLT < 30k    Anemia  Iron studies 2/20 consistent with critical illness/inflammation  - Maintain Hgb >7    T2DM  Was on insulin gtt in SICU (2/28 -3/11)  -?Lantus?40u?BID  - high dose LISS     Anxiety / Depression  - Clonidine taper?given prolonged precedex used  - Clonazepam 0.5mg  BID  - Ativan 1mg  IV Q6h PRN; could consider increasing to q4 PRN  - Appreciate palliative care and psych consults    HTN / HLD  -  lisinopril and lasix were held for AKI.   BNP 2k and renal function stable --> resume lasix in morning  - statin    Diet: TF no tray (nepro for hyperkalemia)  Med Rec:  completed by pharmacist   PPX: heparin gtt for DVT, pepcid for PUD ppx  Code Status: No Code/DNR/No ACLS  Dispo: uncertain, pending course and GOC    Signed:  Hillery Hunter PA-C   Contact via Mobile Heartbeat   11/14/2020  2:24 PM    Attending Statement  Patient seen and examined. I have read the above note by Hillery Hunter with which I agree.  Marveen Reeks, MD  Hospitalist Attending Physician  [facility]  [phone] office  11/14/2020  4:33 PM

## 2020-11-14 NOTE — Other
Updated pt's sisterShe was not able to visit today because her son is [facility] and going to hospice.

## 2020-11-14 NOTE — Progress Notes
[facility] Medicine Progress NoteAttending Provider: Claudette Laws, MD Subjective                                                                              Subjective: Interim History: Pt transferred from SICU to medicine service, hospitalist team. Chart reviewed, pt seen and examined, transfer note appreciated. Briefly, pt is 61m h/o left laryngeal invasive SCC (dxed in 10/21, s/p trach/PEG 1/22, s/p CCRT) , HTN, HL, DM, Waldenstrom's macroglobulinemia, CRI (baseline cr - 1.5) who was transferred from OSH on 10/10/19 for trach management. Pt underwent trach exchange on 2/5 and transferred to oncology floor where he experienced multiple episodes of mucous plugging with RRTs --> transferred to SICU for vent placement. His SICU course has been most notable for recurrent VAP with sepsis/shock requiring pressors and trach dislodgement in setting of severe anxiety requiring continuous sedation. Other notable events including hyperglycemia requiring insulin gtt (switched to lantus bid on 3/8), AKI with creatinine up to 2.6, anemia requiring transfusions, right IJ thrombosis (heparin held until 3/7 due to low platelets). Pt undergoing PSV trials --> tolerated 9/5 yesterday. On interview, asleep, arousable to voice but no responding to questions.Review of Allergies/Meds/Hx: Review of Allergies/Meds/Hx:I have reviewed the patient's: allergies, current scheduled medications, current infusions, current prn medications, past medical history, past surgical history and prior to admission medications Objective Objective: Vitals:Last 24 hours: Temp:  [98.8 ?F (37.1 ?C)-100.04 ?F (37.8 ?C)] 98.8 ?F (37.1 ?C)Pulse:  [86-142] 92Resp:  [17-43] 20BP: (114-172)/(56-91) 114/70SpO2:  [95 %-100 %] 99 %I/O's:Gross Totals (Last 24 hours) at 11/14/2020 0328Last data filed at 11/14/2020 0100Intake 3055.64 ml Output 2965 ml Net 90.64 ml Procedures:None Physical Exam Constitutional: obese man lying in bed in mild apparent distressEyes: anicteric scleraENMT: mmm, op clear, trach site - bandaged - c/d/iLymph: no LADRespiratory: CTA b/lCardiovascular: RRR, S1S2, no m/g/rGastrointestinal: Soft, nt, nd, normoactive bsGenitourinary: no suprapubic tenderness or CVATSkin: no rash, edemaMusculoskeletal: normal range and motionNeuro: lethargicPsych: unable to determineLabs:Last 24 hours: Recent Results (from the past 24 hour(s)) Magnesium  Collection Time: 11/13/20  5:04 AM Result Value Ref Range  Magnesium 2.2 1.7 - 2.4 mg/dL Phosphorus     (BH GH L LMW YH)  Collection Time: 11/13/20  5:04 AM Result Value Ref Range  Phosphorus 3.6 2.2 - 4.5 mg/dL Partial thromboplastin time  Collection Time: 11/13/20  5:04 AM Result Value Ref Range  PTT 50.8 (H) 23.0 - 31.4 seconds Basic metabolic panel  Collection Time: 11/13/20  5:04 AM Result Value Ref Range  Sodium 143 136 - 144 mmol/L  Potassium 4.5 3.3 - 5.3 mmol/L  Chloride 108 (H) 98 - 107 mmol/L  CO2 28 20 - 30 mmol/L  Anion Gap 7 7 - 17  Glucose 205 (H) 70 - 100 mg/dL  BUN 71 (H) 6 - 20 mg/dL  Creatinine 1.61 (H) 0.96 - 1.30 mg/dL  Calcium 9.9 8.8 - 04.5 mg/dL  BUN/Creatinine Ratio 40.9 (H) 8.0 - 23.0  eGFR (Afr Amer) 43 >60 mL/min/1.2m2  eGFR (NON African-American) 36 >60 mL/min/1.34m2 POC Glucose (Fingerstick)  Collection Time: 11/13/20  5:05 AM Result Value Ref Range  Glucose, Meter 193 (H) 70 - 100 mg/dL CBC auto differential  Collection Time: 11/13/20  6:15 AM Result Value Ref Range  WBC 11.0 4.0 - 11.0 x1000/?L  RBC 2.82 (L) 4.00 - 6.00 M/?L  Hemoglobin 7.7 (L) 13.2 - 17.1 g/dL  Hematocrit 16.10 (L) 96.04 - 50.00 %  MCV 90.8 80.0 - 100.0 fL  MCH 27.3 27.0 - 33.0 pg  MCHC 30.1 (L) 31.0 - 36.0 g/dL  RDW-CV 54.0 (H) 98.1 - 15.0 %  Platelets 153 150 - 420 x1000/?L  MPV 12.1 (H) 8.0 - 12.0 fL  Neutrophils 75.1 (H) 39.0 - 72.0 %  Lymphocytes 4.1 (L) 17.0 - 50.0 %  Monocytes 12.1 (H) 4.0 - 12.0 %  Eosinophils 6.9 (H) 0.0 - 5.0 %  Basophil 0.5 0.0 - 1.4 %  Immature Granulocytes 1.3 (H) 0.0 - 1.0 %  nRBC 0.0 0.0 - 1.0 %  ANC(Abs Neutrophil Count) 8.24 (H) 2.00 - 7.60 x 1000/?L  Absolute Lymphocyte Count 0.45 (L) 0.60 - 3.70 x 1000/?L  Monocyte Absolute Count 1.33 (H) 0.00 - 1.00 x 1000/?L  Eosinophil Absolute Count 0.76 0.00 - 1.00 x 1000/?L  Basophil Absolute Count 0.05 0.00 - 1.00 x 1000/?L  Absolute Immature Granulocyte Count 0.14 0.00 - 0.30 x 1000/?L  Absolute nRBC 0.00 0.00 - 1.00 x 1000/?L POC Glucose (Fingerstick)  Collection Time: 11/13/20 11:42 AM Result Value Ref Range  Glucose, Meter 251 (H) 70 - 100 mg/dL POC Glucose (Fingerstick)  Collection Time: 11/13/20  5:13 PM Result Value Ref Range  Glucose, Meter 219 (H) 70 - 100 mg/dL POC Glucose (Fingerstick)  Collection Time: 11/13/20  9:27 PM Result Value Ref Range  Glucose, Meter 210 (H) 70 - 100 mg/dL POC Glucose (Fingerstick)  Collection Time: 11/13/20 11:23 PM Result Value Ref Range  Glucose, Meter 220 (H) 70 - 100 mg/dL POC Glucose (Fingerstick)  Collection Time: 11/14/20 12:41 AM Result Value Ref Range  Glucose, Meter 209 (H) 70 - 100 mg/dL Diagnostics:No new radiologyECG/Tele Events: No ECG ordered today Assessment Assessment: Assessment:26m h/o left laryngeal invasive SCC (dxed in 10/21, s/p trach/PEG 1/22, s/p CCRT) , HTN, HL, DM, Waldenstrom's macroglobulinemia, CRI (baseline cr - 1.5) who was transferred from OSH on 10/10/19 for trach management. HC with VAP sepsis, difficulty weaning due to severe anxiety, palliative radiation --> transferred to pulmonary stepdown to for ongoing PSV trials. Plan Plan: 1. Pulm - s/p trach placement in 1/22 for SCC invasion of stoma, recurrent mucus plugging requiring trach exchanges, slowly improving PSV trial, s/p decadron x 3 days (2/22-2/24)- pt had PSV 9/5 tolerated well- rest on PCV IP 12- appreciate respiratory input regarding further trials - alb neb q6 prn- frequent suctioning to prevent mucus plugging2. Onc - left laryngeal SCC (dxed 10/21, on CCRT prior to transfer to [facility]) s/p palliative radiation x 6 in February --> no surgical/medical curative options available- pt now DNR (2/25)- appreciate palliative care team input- oncology no longer following --> consider consulting if need further GOC discussions3. Renal - h/o CRI with baseline cr - 1.5 now, hospital course notable for acute on chronic renal failure with cr up to 2.6. Cr now down to 2.0. - stable creatinine --> cont to follow- pt with foley catheter until 3/10 --> now able to void spontaneously- follow u/o, consider bladder scans if decreased- had episode of hypernatremia --> now resolved following fluids- follow k, mg4. Heme - incidental right IJ thrombosis at [facility] port seen on [place] neck --> heparin deferred due to low plts but started on 3/8- cont heparin for now- h/o Waldenstrom macroglobulinemia -->  follow plts closely, transfuse  plts<30- pt anemic with hgb yesterday of 7.7 --> has required PRBCs this admission --> keep hgb>75. Psych - h/o anxiety/depression with severe anxious episodes during weaning trials- pt on clonidine taper following prolonged precedex use- cont standing klonopin 0.5mg  bid- prn lorazepam 1mg  iv q6- pt with chronic pain --> cont oxycodone/dilaudid/tylenol for palliative care6. CV - h/o HTN, HL --> bp stable off home meds- cont statin7. Endo - h/o DM on lantus, glucophage pta --> required insulin gtt during SICU stay --> on lantus bid since 3/8- cont lantus 40U q12- ssi coverage with fs8. GI - s/p GT -> cont tube feeds as tolerated, no tray- cont h2 blocker- cont miralax, senna given narcotics9. PT/OT - cont evaluation, OOB as tolerated10. Code - DNRElectronically Signed:Amalya Salmons Maryjane Hurter, MD Beeper [phone]/08/2021, 3:06 AM

## 2020-11-14 NOTE — Plan of Care
Problem: Adult Inpatient Plan of CareGoal: Plan of Care Review3/08/2021 1712 by Garrison Columbus, RNOutcome: Interventions implemented as appropriate3/08/2021 1712 by Garrison Columbus, RNOutcome: Interventions implemented as appropriateGoal: Patient-Specific Goal (Individualized)11/14/2020 1712 by Garrison Columbus, RNOutcome: Interventions implemented as appropriate3/08/2021 1712 by Garrison Columbus, RNOutcome: Interventions implemented as appropriateGoal: Absence of Hospital-Acquired Illness or Injury3/08/2021 1712 by Garrison Columbus, RNOutcome: Interventions implemented as appropriate3/08/2021 1712 by Garrison Columbus, RNOutcome: Interventions implemented as appropriateGoal: Optimal Comfort and Wellbeing3/08/2021 1712 by Garrison Columbus, RNOutcome: Interventions implemented as appropriate3/08/2021 1712 by Garrison Columbus, RNOutcome: Interventions implemented as appropriateGoal: Readiness for Transition of Care3/08/2021 1712 by Garrison Columbus, RNOutcome: Interventions implemented as appropriate3/08/2021 1712 by Garrison Columbus, RNOutcome: Interventions implemented as appropriate Plan of Care Overview/ Patient Status    1500-1900Patient AAO  To self. No c/o pain or discomfort. No s/sx resp or cardiac distress. Tele NS. Sucitoned as need, trach care done tolerated well. TF running at goal. Dressings to wounds intact. T&P Q2hrs. No BM this shift. Providers aware. Call bell within reach. Safety maintained

## 2020-11-14 NOTE — Other
[facility]    [place] [facility]     Palliative Care Follow-Up Note    Present History   I met with patient's sister Arthur [name] while she was visiting today, continuing our conversation away from the bedside. Arthur [name] was briefly alert upon my entry into the room but subsequently closed his eyes. Per Arthur [name]'s report, he has appeared intermittently agitated while she has been at the bedside.    Arthur [name] needs to return to [place] this afternoon to continue providing support to her son, who is transitioning onto hospice care. She is aware that Arthur [name] may be transferred to 10-7 and views this as a step closer to discharge. She shares her concerns that returning to his apartment when he is ready for discharge is no longer a viable option for him. I attempted to gently manage her expectations about Arthur [name]'s progress, noting the ongoing challenge of weaning him off the vent. Arthur [name] also shares concerns that some of her family's household utilities are paid by [place] and are at risk of being shut off because they cannot access his accounts. She is unsure how to proceed. I advised her that I would refer the question to Social Work.     Review of Allergies/Hx/Meds   Allergies  Patient has No Known Allergies.    Medical History  Past Medical History:   Diagnosis Date   ? Diabetes mellitus (HC Code) (HC CODE)    ? Hypercholesteremia    ? Hypertension        Family History  Family History   Problem Relation Age of Onset   ? Aneurysm Mother        Social History  Social History     Socioeconomic History   ? Marital status: Legally Separated     Spouse name: Not on file   ? Number of children: Not on file   ? Years of education: Not on file   ? Highest education level: Not on file   Occupational History   ? Not on file   Tobacco Use   ? Smoking status: Former Smoker     Packs/day: 1.00     Years: 30.00     Pack years: 30.00     Quit date: 03/04/2013     Years since quitting: 7.7   Substance and Sexual Activity   ? Alcohol use: Yes     Alcohol/week: 14.0 standard drinks     Types: 14 Cans of beer per week   ? Drug use: Not on file   ? Sexual activity: Not on file   Other Topics Concern   ? Not on file   Social History Narrative   ? Not on file     Social Determinants of Health     Financial Resource Strain:    ? Difficulty of Paying Living Expenses:    Food Insecurity:    ? Worried About Programme researcher, broadcasting/film/video in the Last Year:    ? Barista in the Last Year:    Transportation Needs:    ? Freight forwarder (Medical):    ? Lack of Transportation (Non-Medical):    Physical Activity:    ? Days of Exercise per Week:    ? Minutes of Exercise per Session:    Stress:    ? Feeling of Stress :    Social Connections:    ? Frequency of Communication with Friends and Family:    ? Frequency of Social Gatherings  with Friends and Family:    ? Attends Religious Services:    ? Active Member of Clubs or Organizations:    ? Attends Banker Meetings:    ? Marital Status:    Intimate Partner Violence:    ? Fear of Current or Ex-Partner:    ? Emotionally Abused:    ? Physically Abused:    ? Sexually Abused:        InPatient Medications  Current Facility-Administered Medications   Medication Dose Route Frequency Last Rate   ? acetaminophen  650 mg Per G Tube Q6H PRN     ? albuterol  2.5 mg Nebulization Q6H PRN     ? ascorbic acid  250 mg Per G Tube Daily     ? bisacodyL  10 mg Rectal DAILY PRN     ? carboxymethylcellulose  1 drop Both Eyes TID PRN     ? chlorhexidine gluconate  15 mL Mouth/Throat Q12H     ? clonazePAM  0.5 mg Per J Tube BID     ? cloNIDine HCL  0.2 mg Per G Tube Q12H      Followed by   ? [START ON 11/16/2020] cloNIDine HCL  0.2 mg Per G Tube Q24H     ? famotidine  20 mg Per J Tube Daily     ? folic acid  1 mg Per G Tube Daily     ? glucagon  1 mg Intramuscular Once PRN     ? heparin 25,000 units/250 ml infusion - High Risk of Bleeding  7-30 Units/kg/hr (Dosing Weight) Intravenous Continuous 16 Units/kg/hr (11/13/20 1400)   ? HYDROmorphone  1 mg IV Push Q3H PRN     ? insulin glargine  40 Units Subcutaneous Q12H     ? insulin regular human   Subcutaneous Q6H     ? LORazepam  1 mg IV Push Q6H PRN     ? magnesium hydroxide  30 mL Per G Tube Daily     ? miconazole nitrate   Topical (Top) BID     ? midazolam (PF)         ? oxyCODONE  10 mg Per G Tube Q4H PRN     ? oxyCODONE  15 mg Per G Tube Q4H PRN     ? polyethylene glycol  17 g Per G Tube BID     ? amino acids-protein hydrolys  1 packet Per G Tube TID     ? rosuvastatin  10 mg Per G Tube Daily     ? senna  1 tablet Per G Tube BID     ? thiamine  50 mg Per G Tube Daily     ? zinc oxide  1 Application Topical (Top) DAILY PRN         [email or web address]    Palliative Performance Scale 30%   (Modification of Karnofsky and was designed for measurement of physical status in Palliative Care. Only 10% of patients with PPS score <=50% would be expected to survive >6 months)    Physical Exam  Vitals and nursing note reviewed.   Constitutional:       General: He is not in acute distress.     Appearance: He is ill-appearing.      Comments: Briefly alert, then closes eyes   Pulmonary:      Effort: No respiratory distress.      Comments: Trached to vent  Neurological:      Mental Status: He is alert.  Comments: Did not attempt to rouse given reports of ongoing agitation when alert         Vitals:  Temp:  [98.9 ?F (37.2 ?C)-99.86 ?F (37.7 ?C)] 99.68 ?F (37.6 ?C)  Pulse:  [93-142] 102  Resp:  [17-43] 22  BP: (99-190)/(44-92) 163/69  SpO2:  [97 %-100 %] 97 %  ECOG Performance Status (non-calculated): 3  calc. ECOG (from Karnofsky): 2  Wt Readings from Last 3 Encounters:   11/13/20 (!) 147.9 kg   10/06/20 (!) 139.5 kg   09/29/20 (!) 142.4 kg       Review of Labs/Diagnostics  Complete Blood Count:  Recent Labs   Lab 11/11/20  0509 11/12/20  0536 11/13/20  0615   WBC 8.9 10.9 11.0   HGB 7.3* 7.9* 7.7*   HCT 24.90* 25.40* 25.60*   PLT 128* 144* 153     Comprehensive Metabolic Panel:  Recent Labs   Lab 11/11/20  0509 11/11/20  0512 11/12/20  0536 11/12/20  0610 11/13/20  0504 11/13/20  0505 11/13/20  1142   NA 149*  --  143  --  143  --   --    K 4.3  --  4.6  --  4.5  --   --    CL 114*  --  109*  --  108*  --   --    CO2 26  --  26  --  28  --   --    BUN 83*  --  74*  --  71*  --   --    CREATININE 1.94*  --  1.88*  --  1.95*  --   --    GLU 129*   < > 158* - 167*   < > 205*   < > 251*   CALCIUM 9.7  --  9.8  --  9.9  --   --     < > = values in this interval not displayed.     No results for input(s): ALKPHOS, BILITOT, BILIDIR, PROT, ALT, AST in the last 168 hours.    Invalid input(s): LABALBU    Diagnostics:  US Duplex Upper Extremity Venous Left    Result Date: 11/12/2020  Left upper extremity venous duplex ultrasound,  11/12/2020 History: Left upper extremity edema Findings: Arthur [name] scale, color Doppler flow imaging, and spectral Doppler imaging is performed. Venous Doppler examination of the left upper extremity is negative for demonstrable thrombus in the subclavian, axillary, and brachial veins. Internal jugular vein is not visualized due to overlying bandages and tracheostomy. Cephalic and basilic veins are negative for demonstrable thrombus. The contralateral medial right subclavian vein is patent with a pulsatile waveform. Impression: Limited exam as noted above. No demonstrable thrombus in the above named vessels. Report Initiated By:  Nehemiah Settle, MD Reported And Signed By: Karna Christmas, MD  [facility] Radiology and Biomedical Imaging         Impression/Plan:   Arthur [name] is a 55 y.o. 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 Admitted to SICU on 2/9 for ventilator management following RRT on oncology floor for desaturation and increased work of breathing while on trach mask, ultimately placed back on vent. Referred for Palliative Care Consult by Attending Provider: Alric Quan, MD [phone] for supportive care.    Coping/Support:   - Ongoing  assessments for psychosocial and/or spiritual needs  - Family endorse financial stressors/ concern for losing access to some utilities in Arthur [name]'s name. Palliative Care Social Worker Arthur [name] made aware and will follow up with family.   - Full interdisciplinary team support available as needed    Advance Care Planning:  - DNR following family meeting of 2/25  - No advance directives or health care proxy documentation on file  - His sister and niece are listed as emergency contacts and his sister is next of kin and default proxy decision maker in the event that patient lacks capacity    Goals of Care:   - Ongoing discussions. Family aware of concern for worsening condition and overall poor prognosis, do not want to see him suffer. Following most recent family meeting, they elected to make him DNR but otherwise continue with the current plan of care, as they believe this best represents what Arthur [name] would choose for himself if he were able to verbalize his wishes.     Thank you for involving the Palliative Care team in the care of this patient. The Palliative Care team will continue to follow.    Patient was evaluated: in person    Time committed: > 25 minutes were spent in chart review, coordination of care, and at the bedside in patient/family counseling and education, including detailed concepts of symptom managment and goals of care/advanced care planning.    Signed:  Debbrah Alar, APRN  Palliative Care Consult Service  11/13/2020 2:36 PM

## 2020-11-15 ENCOUNTER — Inpatient Hospital Stay: Admit: 2020-11-15 | Payer: PRIVATE HEALTH INSURANCE

## 2020-11-15 DIAGNOSIS — J9601 Acute respiratory failure with hypoxia: Secondary | ICD-10-CM

## 2020-11-15 DIAGNOSIS — C329 Malignant neoplasm of larynx, unspecified: Secondary | ICD-10-CM

## 2020-11-15 LAB — CBC WITH AUTO DIFFERENTIAL
BKR WAM ABSOLUTE IMMATURE GRANULOCYTES.: 0.19 x 1000/ÂµL (ref 0.00–0.30)
BKR WAM ABSOLUTE IMMATURE GRANULOCYTES.: 0.14 x 1000/ÂµL (ref 0.00–0.30)
BKR WAM ABSOLUTE LYMPHOCYTE COUNT.: 0.3 x 1000/ÂµL — ABNORMAL LOW (ref 0.60–3.70)
BKR WAM ABSOLUTE LYMPHOCYTE COUNT.: 0.43 x 1000/ÂµL — ABNORMAL LOW (ref 0.60–3.70)
BKR WAM ABSOLUTE NRBC (2 DEC): 0 x 1000/ÂµL (ref 0.00–1.00)
BKR WAM ABSOLUTE NRBC (2 DEC): 0 x 1000/ÂµL (ref 0.00–1.00)
BKR WAM ANALYZER ANC: 8.54 x 1000/ÂµL — ABNORMAL HIGH (ref 2.00–7.60)
BKR WAM ANALYZER ANC: 8.13 x 1000/ÂµL — ABNORMAL HIGH (ref 2.00–7.60)
BKR WAM BASOPHIL ABSOLUTE COUNT.: 0.03 x 1000/ÂµL (ref 0.00–1.00)
BKR WAM BASOPHIL ABSOLUTE COUNT.: 0.04 x 1000/ÂµL (ref 0.00–1.00)
BKR WAM BASOPHILS: 0.3 % (ref 0.0–1.4)
BKR WAM BASOPHILS: 0.4 % (ref 0.0–1.4)
BKR WAM EOSINOPHIL ABSOLUTE COUNT.: 0.42 x 1000/ÂµL (ref 0.00–1.00)
BKR WAM EOSINOPHIL ABSOLUTE COUNT.: 0.41 x 1000/ÂµL (ref 0.00–1.00)
BKR WAM EOSINOPHILS: 3.9 % (ref 0.0–5.0)
BKR WAM EOSINOPHILS: 3.8 % (ref 0.0–5.0)
BKR WAM HEMATOCRIT (2 DEC): 27 % — ABNORMAL LOW (ref 38.50–50.00)
BKR WAM HEMATOCRIT (2 DEC): 27.9 % — ABNORMAL LOW (ref 38.50–50.00)
BKR WAM HEMOGLOBIN: 8.2 g/dL — ABNORMAL LOW (ref 13.2–17.1)
BKR WAM HEMOGLOBIN: 8.5 g/dL — ABNORMAL LOW (ref 13.2–17.1)
BKR WAM IMMATURE GRANULOCYTES: 1.8 % — ABNORMAL HIGH (ref 0.0–1.0)
BKR WAM IMMATURE GRANULOCYTES: 1.3 % — ABNORMAL HIGH (ref 0.0–1.0)
BKR WAM LYMPHOCYTES: 2.8 % — ABNORMAL LOW (ref 17.0–50.0)
BKR WAM LYMPHOCYTES: 4 % — ABNORMAL LOW (ref 17.0–50.0)
BKR WAM MCH (PG): 26.9 pg — ABNORMAL LOW (ref 27.0–33.0)
BKR WAM MCH (PG): 27.6 pg (ref 27.0–33.0)
BKR WAM MCHC: 30.4 g/dL — ABNORMAL LOW (ref 31.0–36.0)
BKR WAM MCHC: 30.5 g/dL — ABNORMAL LOW (ref 31.0–36.0)
BKR WAM MCV: 88.5 fL (ref 80.0–100.0)
BKR WAM MCV: 90.6 fL (ref 80.0–100.0)
BKR WAM MONOCYTE ABSOLUTE COUNT.: 1.33 x 1000/ÂµL — ABNORMAL HIGH (ref 0.00–1.00)
BKR WAM MONOCYTE ABSOLUTE COUNT.: 1.57 x 1000/ÂµL — ABNORMAL HIGH (ref 0.00–1.00)
BKR WAM MONOCYTES: 12.3 % — ABNORMAL HIGH (ref 4.0–12.0)
BKR WAM MONOCYTES: 14.6 % — ABNORMAL HIGH (ref 4.0–12.0)
BKR WAM MPV: 11.7 fL (ref 8.0–12.0)
BKR WAM MPV: 11 fL (ref 8.0–12.0)
BKR WAM NEUTROPHILS: 78.9 % — ABNORMAL HIGH (ref 39.0–72.0)
BKR WAM NEUTROPHILS: 75.9 % — ABNORMAL HIGH (ref 39.0–72.0)
BKR WAM NUCLEATED RED BLOOD CELLS: 0 % (ref 0.0–1.0)
BKR WAM NUCLEATED RED BLOOD CELLS: 0 % (ref 0.0–1.0)
BKR WAM PLATELETS: 206 x1000/ÂµL (ref 150–420)
BKR WAM PLATELETS: 203 x1000/ÂµL (ref 150–420)
BKR WAM RDW-CV: 17.3 % — ABNORMAL HIGH (ref 11.0–15.0)
BKR WAM RDW-CV: 17.4 % — ABNORMAL HIGH (ref 11.0–15.0)
BKR WAM RED BLOOD CELL COUNT.: 3.05 M/ÂµL — ABNORMAL LOW (ref 4.00–6.00)
BKR WAM RED BLOOD CELL COUNT.: 3.08 M/ÂµL — ABNORMAL LOW (ref 4.00–6.00)
BKR WAM WHITE BLOOD CELL COUNT: 10.8 x1000/ÂµL (ref 4.0–11.0)
BKR WAM WHITE BLOOD CELL COUNT: 10.7 x1000/ÂµL (ref 4.0–11.0)

## 2020-11-15 LAB — MAGNESIUM: BKR MAGNESIUM: 2.4 mg/dL (ref 1.7–2.4)

## 2020-11-15 LAB — BASIC METABOLIC PANEL
BKR ANION GAP: 10 (ref 7–17)
BKR BLOOD UREA NITROGEN: 68 mg/dL — ABNORMAL HIGH (ref 6–20)
BKR BUN / CREAT RATIO: 36.8 — ABNORMAL HIGH (ref 8.0–23.0)
BKR CALCIUM: 10.6 mg/dL — ABNORMAL HIGH (ref 8.8–10.2)
BKR CHLORIDE: 109 mmol/L — ABNORMAL HIGH (ref 98–107)
BKR CO2: 28 mmol/L (ref 20–30)
BKR CREATININE: 1.85 mg/dL — ABNORMAL HIGH (ref 0.40–1.30)
BKR EGFR (AFR AMER): 46 mL/min/{1.73_m2} (ref >60)
BKR EGFR (NON AFRICAN AMERICAN): 38 mL/min/{1.73_m2} (ref >60)
BKR GLUCOSE: 151 mg/dL — ABNORMAL HIGH (ref 70–100)
BKR POTASSIUM: 4.4 mmol/L (ref 3.3–5.3)
BKR SODIUM: 147 mmol/L — ABNORMAL HIGH (ref 136–144)

## 2020-11-15 LAB — LIVER FUNCTION TESTS     (YH)
BKR ALANINE AMINOTRANSFERASE (ALT): 49 U/L (ref 9–59)
BKR ALKALINE PHOSPHATASE: 193 U/L — ABNORMAL HIGH (ref 9–122)
BKR ASPARTATE AMINOTRANSFERASE (AST): 22 U/L (ref 10–35)
BKR AST/ALT RATIO: 0.4
BKR BILIRUBIN DIRECT: <0.2 mg/dL (ref <=0.3)
BKR BILIRUBIN TOTAL: <0.2 mg/dL (ref <=1.2)

## 2020-11-15 LAB — BLOOD GAS, ARTERIAL (BH YH)
BKR BASE EXCESS ARTERIAL: 2 mmol/L (ref ?–2)
BKR FIO2: 50 %
BKR HCO3, ARTERIAL: 26.7 mmol/L (ref 21.0–28.0)
BKR O2 SATURATION, ARTERIAL: 98 % (ref 94–98)
BKR PATIENT TEMP: 37.7 Cel
BKR PCO2 ARTERIAL: 46 mmHg (ref 32–48)
BKR PH, ARTERIAL: 7.39 U (ref 7.35–7.45)
BKR PO2, ARTERIAL: 105 mmHg (ref 83–108)

## 2020-11-15 LAB — PARTIAL THROMBOPLASTIN TIME     (BH GH LMW Q YH): BKR PARTIAL THROMBOPLASTIN TIME: 47.8 s — ABNORMAL HIGH (ref 23.0–31.4)

## 2020-11-15 LAB — VITAMIN B12: BKR VITAMIN B12: 1711 pg/mL — ABNORMAL HIGH (ref 232–1245)

## 2020-11-15 LAB — PHOSPHORUS     (BH GH L LMW YH): BKR PHOSPHORUS: 3.3 mg/dL (ref 2.2–4.5)

## 2020-11-15 LAB — TSH W/REFLEX TO FT4     (BH GH LMW Q YH): BKR THYROID STIMULATING HORMONE: 4.76 u[IU]/mL — ABNORMAL HIGH

## 2020-11-15 MED ORDER — EPINEPHRINE 0.3 MG/0.3 ML INJECTION, AUTO-INJECTOR
0.3 mg/ mL | Status: DC
Start: 2020-11-15 — End: 2020-11-15

## 2020-11-15 MED ORDER — LISINOPRIL 2.5 MG TABLET
2.5 mg | Freq: Every day | GASTROSTOMY | Status: DC
Start: 2020-11-15 — End: 2020-11-17
  Administered 2020-11-16 – 2020-11-17 (×2): 2.5 mg via GASTROSTOMY

## 2020-11-15 NOTE — Other
RRT called for brief episode of bradycardia to 40s. Pt has been tachycardic baseline. Upon arrival, Pt tachypneic on vent with RR 40s, BP 140s/60s, tachycardic to 110s, EKG done, suctioned for copious amount of thick tan/creamy secretion. ABG done, cxr done, CBC and BMP labs drawn. Pt returned to baseline with HR in 90-100s, RR 20s, O2 sat 96%. Pt to remain on floor. Plan of care discussed with primary RN to call SWAT for any change in status or concern.

## 2020-11-15 NOTE — Progress Notes
[name]    [place] Medicine Progress Note  Attending Provider: Irven Easterly, MD [phone]    Hospital Day: 37   Consultants: pulm  Subjective:   RRT overnight for bradycardia (HR 40s) and then tachypnea with tachycardia episode.  ABG wnl 7.39 / 46/ 105/ 27  CXR unchanged. Stable RUL consolidations and L pleural effusion  He returned to baseline after suction for copious secretion    Unable to obtain ROS due to mental status    Objective:   Vitals:  Temp:  [98 ?F (36.7 ?C)-98.9 ?F (37.2 ?C)] 98.8 ?F (37.1 ?C)  Pulse:  [95-126] 102  Resp:  [22-40] 28  BP: (147-185)/(71-123) 174/89  SpO2:  [95 %-100 %] 98 %  Device (Oxygen Therapy): mechanical ventilator  O2 Flow (L/min):  [8] 8   Weight: (!) 147.9 kg    I/O's: I have reviewed I&Os as documented by nursing staff  Labs: I have reviewed all relevant labs.  Procal 0.92  BNP 2K    Physical Exam  Constitutional:       Appearance: He is ill-appearing.   HENT:      Head: Normocephalic.   Neck:      Comments: Large neck mass with peristomal necrosis and apparent extension distally with pink nodule nearly at sternal notch  Cardiovascular:      Rate and Rhythm: Normal rate and regular rhythm.      Heart sounds: Normal heart sounds.   Pulmonary:      Comments: Coarse vented breath sounds anteriorly  Abdominal:      General: Bowel sounds are normal. There is no distension.      Palpations: Abdomen is soft.      Tenderness: There is no abdominal tenderness.      Comments: g-tube without surrounding erythema or edema   Musculoskeletal:      Comments: Edematous extremities   Skin:     General: Skin is warm and dry.   Neurological:      Mental Status: He is alert.      Comments: Makes eye contact and squeezed my hand when asked. Otherwise unable to follow simple instructions.  Unable to assess orientation   Psychiatric:      Comments: Unable to asses       Diagnostics:  XR Chest PA or AP (Portable)    Result Date: 11/15/2020  XR CHEST PA OR AP Date: 11/15/2020 4:17 AM INDICATION: tachypnea. COMPARISON: 11/14/2020. FINDINGS: Support Devices: Tracheostomy tube and right IJ Port-A-Cath appear in stable position. Unchanged prominence of the mediastinum which may in part represent adenopathy. The heart is within normal size limits. Airspace consolidations within the right upper lobe and lingula, as well as left-sided pleural effusion with associated compressive atelectasis appear unchanged. No pneumothorax.      No significant interval change. Stable right airspace consolidations and left pleural effusion. Reported And Signed By: Channing Mutters, MD  [facility] Radiology and Biomedical Imaging     XR Chest PA or AP    Result Date: 11/14/2020  XR CHEST PA OR AP INDICATION: increased work of breathing in vented pt. COMPARISON: 11/11/2020. FINDINGS: Tracheostomy tube and right internal jugular Port-A-Cath are in stable position. The cardiac silhouette is within normal limits. There is similar right upper lobe and lingular airspace consolidation. Bilateral pleural effusions are unchanged. There is no pneumothorax.      No significant interval change, with similar right upper lobe and lingular airspace consolidation.  Reported And Signed By: Cori Razor, MD  [facility] Radiology and Biomedical Imaging     XR Abdomen AP    Result Date: 11/14/2020  Study: XR ABDOMEN AP  Indication:  assess stool burden Comparison:  [place] abdomen and pelvis from 10/15/2020 Gastrostomy tube projects over the left upper quadrant. There is a nonobstructive bowel gas pattern. A moderate to large amount of formed stool is noted in the rectum measuring 7 cm in diameter. A catheter projects over the rectum. Reported And Signed By: Jeralyn Bennett, MD  [facility] Radiology and Biomedical Imaging        Micro:  No results for input(s): LABBLOO, LABURIN, LOWERRESPIRA in the last 168 hours.  No results found for this or any previous visit (from the past 168 hour(s)).  @CULTURE @    ECG/Tele Events: no new EKG today  Assessment:     Gregor Dershem is a 55 y.o. 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 He was found with progression of malignancy with encroachment of tumor on his trachea. There was concern for obstruction at [facility] so he was transferred to ENT at [facility] for trach management. Scope notable for partial distal obstruction. On day 5 of admission he had respiratory distress requiring ventilator and SICU transfer. SICU course complicated by incidental R IJ thrombus at port, MRSA VAP, urinary retention, and multiple RRTs for desat 2/2 trach occlusion/obstruction and mucous plugging. Transferred to pulm stepdown for vent weaning 11/13/2020.    PERTINENT TRACH INFORMATION  Service who placed trach: Other: placed at OSH.  ENT here  Date of placement:    Date of last routine trach change:  Airway 11/11/20 1100 tracheostomy-Date Tracheostomy Changed: 11/11/20   Type of trach:    #8 hyperflex cuffed Bivona    Plan:     Respiratory failure 2/2 airway obstruction from laryngeal SCC  Tumor invasion of stoma with partial distal obstruction  Mucous plugging iso tumor necrosis  Trach managed by ENT  #8 hyperflex bivona to bypass obstruction. 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  No head imaging since PET in October '21 which showed no brain abnormality.    - [place] neck, chest as above  - adding [place] head given AMS and possibility for metastasis    Constipation   7cm mod-large stool in rectum,--> had a large BM overnight  - bowel regimen with prn suppository    Hypernatremia  - FWF 215cc q4h   Today's labs pending    Urinary retention with foley  Failed voiding trials 2/17 and 3/11  AKI on CKD3 due to supratherapeutic vanc  Prior baseline Cr 1.6  Appears to be stable at new baseline ~1.9    R IJ thrombus at port  Chronic ITP  Waldenstrom macroglobulinemia  Heparin initially deferred due to low platelets, heme evaluated, heparin gtt started 3/8.  - High risk heparin gtt   - Re-consult heme for ITP-directed therapy (dex or IVIG) in case of bleeding, pre-procedural, or if PLT < 30k.   - transfuse plts goal >30    Anemia of critical illness/inflammation  - Maintain Hgb >7    T2DM  Was on insulin gtt in SICU (2/28 -3/11)  -?Lantus?40u?BID  - high dose LISS     Anxiety / Depression  - Clonidine taper?given prolonged precedex used  - Clonazepam 0.5mg  BID  - Ativan 1mg  IV Q6h PRN; could consider increasing to q4 PRN  - Appreciate palliative care and psych consults    HTN / HLD  - lasix 40mg  po resumed  - resume losartan  - statin    Diet: TF no tray (nepro for hyperkalemia)  Med Rec:  completed by pharmacist   PPX: heparin gtt for DVT, pepcid for PUD ppx  Code Status: No Code/DNR/No ACLS  Dispo: uncertain, pending course and GOC    Signed:  Hillery Hunter PA-C   Contact via Mobile Heartbeat   11/15/2020  9:37 AM     Attending Statement  Patient seen and examined. I have read the above note by Hillery Hunter with which I agree, with the following additions and/or modifications.  Patient with frequent RRTs (including last night) for respiratory distress in the setting of mucous plugging, also with episodes of transient bradycardia (most likely vasovagal). Will intensify airway clearance with hypertonic saline nebs, chest PT and frequent suctioning. Sputum culture, BNP, procal and [place] chest ordered, will follow.   Will repeat [place] head, neck and chest with IV contrast to further elucidate local tumor progression and metastasis. Will continue the current ventilator support Volume AC. Continues to have AMS, most likely multifactorial, however needs further work up. [place] head with IV contrast to r/o mets, will check B12, B1, ammonia, LFTs, TSH, RPR, EEG and continue to actively r/o any underlying infection.    Signed:  Marveen Reeks, MD  Hospitalist Attending Physician  [facility]  [phone] office  11/15/2020  5:25 PM

## 2020-11-15 NOTE — Plan of Care
Plan of Care Overview/ Patient Status    Daylight savings time

## 2020-11-15 NOTE — Progress Notes
Neuro: UTA orientation. Intermittently follows commands and tracks. Has had frequent periods of restlessness and attempts to pull ventilator off. Afebrile. CV: SB-ST. Patient had two episodes of bradycardia. Team aware. SBP 110-200s, team aware. +pulses. +2 generalized edema. Resp: On Ventilator, please see orders for details. Pt suctioned for tan creamy secretions. Coarse, diminished lung sounds. Neck ties changed. Would have heavy episodes of belly breathing, team aware. Sats above 90%. GI: TF Nepro full strength at goal 65cc/hr. PEG site intact, no redness, no warmth. Pt had two large BMs today (3/13). +BSGU: Foley in place, patent. Clear yellow urine. Over 1L output for duration of shift, please see flowsheets for details. Skin: Skin barrier cream placed on buttocks for excoriation. Xeroform dressing changed on neck. Safety: BL soft wrist restraints in place, pt made multiple attempts to pull ventilator. Pt had to episodes in which HR would drop to 40s, High BP 200s, team aware and was present at bedside after both events. No new orders. Second episode after patient pulled off ventilator. HR Brady in 40s, ventilator replaced, patient suctioned, HR then 120-130s, BP 120s, team aware.

## 2020-11-15 NOTE — Plan of Care
Plan of Care Overview/ Patient Status  RN now transferred care of PT

## 2020-11-15 NOTE — Other
10-7 Pulmonary Step Down Unit Pulmonary Ventilator Weaning Note--------------------------------------------------Patient Name:  Arthur RaleighDate of Birth:  [birth year]Date of Service: 11/14/2020 MRN:  [id number] LOS: 36  [facility] saw [name] in consultation for assistance with ventilator weaning.  Arthur [name] is a 55 y.o. 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  He is also status post trach and PEG. She presented to [facility] for shortness of breath, noted to have a circumferential necrotic mass at the trach stoma with concern for tumor encroachment.  He was transferred to [facility] for trach management and noted to have partial obstruction.  He had an episode of respiratory distress and was placed on the ventilator and transferred to the SICU.  His SICU course was complicated by MRSA ventilator associated pneumonias and multiple RRTs for desaturation secondary to trach occlusion from mucus plugging.  He is transferred to 10 7 for ventilator weaning on 11/13/2020.ROS : negative except as noted Pertinent Medications: Lasix 40 daily POPhysical Examination:Vital signs:  T: 98.9 ?F (37.2 ?C)  HR: (!) 116  RR: (!) 29 BP: (!) 163/88  O2 sat: 98 %General: NADHEENT: Trach site With tumor encroachment, CDICV: s1s2, no mrgsPulm: course soundsAbdomen: soft nttpExt: No edemaNeuro/psyche: intermittently interactiveTracheostomy Info:Type/Size: 8 hypeflex cuffed BivonaDate of insertion or last change: 3/9/22Service that placed Trach: ENTReview of Data:Labs reviewed.Radiology:Imaging reviewed.Notable for:[place] Neck 2/10Tracheostomy tube in place with tip terminating approximately 4 cm above the carina.Similar large necrotic transposition on mass with invasion into the thyroid gland, trachea, hypopharynx, thyroid cartilage and cervical esophagus with extensive cervical mediastinal and supraclavicular lymphadenopathy.Similar partially occluding thrombus in the right internal jugular vein. Impression:Arthur [name] is a 55 y.o. 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  He is also status post trach and PEG. She presented to [facility] for shortness of breath, noted to have a circumferential necrotic mass at the trach stoma with concern for tumor encroachment.  He was transferred to [facility] for trach management and noted to have partial obstruction.  He had an episode of respiratory distress and was placed on the ventilator and transferred to the SICU.  His SICU course was complicated by MRSA ventilator associated pneumonias and multiple RRTs for desaturation secondary to trach occlusion from mucus plugging.  He is transferred to 10 7 for ventilator weaning on 11/13/2020.Unfortunately, he is in a very difficult situation with encroachment of his tumor to his trachea. He also has frequent mucus plugging and possible tumor plugging of his airway which puts him in danger of a respiratory arrest. We can support him with airway clearance therapy but unclear the end goal if the tumor continues to grow. Hypoxemic Episodes - mucus plugging- Airway clearance therapy with:     - Albuterol nebs TID, followed by     - Saline nebs TID, followed by     - Chest PT     - Suctioning Prn- Obtain LRC- BNP elevated - may benefit from non-urgent diuresis- Would obtain [place] Chest to assess for change in tumor and airway-would check TSH-would consider CNS imaging to exclude CNS metsDifficult/Prolonged Ventilator Wean- Resting Vent Settings: VAC 12/500/40/5- Trial Settings: PSV- No urgency to try to wean as he will never safely be able to not have a vent at the bedside even  when on TM- Ensure electrolytes replete (phos, mag, calcium)- Ensure adequate nutrition- PT and OT if ableCode Status: No Code/DNR/No ACLS Please refer to the [facility] from Mechanical Ventilation in the setting of Chronic Respiratory Failure guidelines for more details.--------------------------------------------------Arthur All HustonFellow, Department of Pulmonary and Critical Care3/08/2021 6:27 PM  11/15/20 Pulmonary Attending 2:45 pmPatient seen, examined and discussed in detail todayw with Dr. Quillian [name]. Chart, events to date and serial imaging reviewed personally. Full hx as noted above. In brief, Arthur [name] is a 55 yo male with dm, former tobacco smoking and former etoh use, hx of waldenstroms macroglobulinemia, hx of ckd, recently dxd in 06/2020 with SCCA of larynx, rx initially with cisplatin/xrt, ultimately had a trach and peg placed in 09/2020, presented to [facility] with dyspnea, found to have progression of his malignancy with encroachment of the tumor on his trachea; he was tx to [facility] service for further mgmt. His hosp course has been ontable for hypoxemic resp failure requiring mechanical ventilation, right ij thrombus (now on heparin), mrsa pneumonias, urinary retention, and he underwent placement of a hyperflex #8 Bivona trach;  He also received course of decadron 2/22-2/24. he has had recurrent bouts of mucus plugging prompting RRTs, also some episodes of bradycardia. He has been tx from SICU to 10-7 for vent weaning. He is unfortunately not felt to have further chemo/xrt/surgical options for mgmt of his primary tumor. On exam, 174/89; afeb, hr 109;Vent settings ac 12 (rr 28), 500/40/5 with pap 29--no evident autopeep mental status fluctuates--at times attentive and responds he feels ok, but other times seems to stare blankly, no overt seizure activity noted. He has necrotic mass in anterior neck, with large lump of tissue at lower margin of trach/upper sternum. 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 Minute ventilation is currently such that not clear he would be able to tolerate PSV trials at present. He would benefit from intensification of airway mucus clearance regimen to minimize mucus plugging risk. He is at risk of further secondary complications related to progression of his tumor, including signifcant, potentially life threatening bleeding.  Also, his mental status appears to be fluctuating--? Possible intracranial mets. I concur with the management recommendations as detailed in Dr. Justice Britain note above, which were formulated with me jointly today. In addition, continued goals of care discussions, input from social service and palliative care will be helpful.  Pulm service will contin to f/u.

## 2020-11-16 ENCOUNTER — Inpatient Hospital Stay: Admit: 2020-11-16 | Payer: PRIVATE HEALTH INSURANCE

## 2020-11-16 DIAGNOSIS — J9601 Acute respiratory failure with hypoxia: Secondary | ICD-10-CM

## 2020-11-16 DIAGNOSIS — C329 Malignant neoplasm of larynx, unspecified: Secondary | ICD-10-CM

## 2020-11-16 LAB — CBC WITH AUTO DIFFERENTIAL
BKR WAM ABSOLUTE IMMATURE GRANULOCYTES.: 0.13 x 1000/ÂµL (ref 0.00–0.30)
BKR WAM ABSOLUTE LYMPHOCYTE COUNT.: 0.28 x 1000/ÂµL — ABNORMAL LOW (ref 0.60–3.70)
BKR WAM ABSOLUTE NRBC (2 DEC): 0 x 1000/ÂµL (ref 0.00–1.00)
BKR WAM ANALYZER ANC: 7.95 x 1000/ÂµL — ABNORMAL HIGH (ref 2.00–7.60)
BKR WAM BASOPHIL ABSOLUTE COUNT.: 0.04 x 1000/ÂµL (ref 0.00–1.00)
BKR WAM BASOPHILS: 0.4 % (ref 0.0–1.4)
BKR WAM EOSINOPHIL ABSOLUTE COUNT.: 0.22 x 1000/ÂµL (ref 0.00–1.00)
BKR WAM EOSINOPHILS: 2.1 % (ref 0.0–5.0)
BKR WAM HEMATOCRIT (2 DEC): 28.6 % — ABNORMAL LOW (ref 38.50–50.00)
BKR WAM HEMOGLOBIN: 8.5 g/dL — ABNORMAL LOW (ref 13.2–17.1)
BKR WAM IMMATURE GRANULOCYTES: 1.3 % — ABNORMAL HIGH (ref 0.0–1.0)
BKR WAM LYMPHOCYTES: 2.7 % — ABNORMAL LOW (ref 17.0–50.0)
BKR WAM MCH (PG): 26.6 pg — ABNORMAL LOW (ref 27.0–33.0)
BKR WAM MCHC: 29.7 g/dL — ABNORMAL LOW (ref 31.0–36.0)
BKR WAM MCV: 89.7 fL (ref 80.0–100.0)
BKR WAM MONOCYTE ABSOLUTE COUNT.: 1.67 x 1000/ÂµL — ABNORMAL HIGH (ref 0.00–1.00)
BKR WAM MONOCYTES: 16.2 % — ABNORMAL HIGH (ref 4.0–12.0)
BKR WAM MPV: 11.5 fL (ref 8.0–12.0)
BKR WAM NEUTROPHILS: 77.3 % — ABNORMAL HIGH (ref 39.0–72.0)
BKR WAM NUCLEATED RED BLOOD CELLS: 0 % (ref 0.0–1.0)
BKR WAM PLATELETS: 211 x1000/ÂµL (ref 150–420)
BKR WAM RDW-CV: 17.7 % — ABNORMAL HIGH (ref 11.0–15.0)
BKR WAM RED BLOOD CELL COUNT.: 3.19 M/ÂµL — ABNORMAL LOW (ref 4.00–6.00)
BKR WAM WHITE BLOOD CELL COUNT: 10.3 x1000/ÂµL (ref 4.0–11.0)

## 2020-11-16 LAB — BASIC METABOLIC PANEL
BKR ANION GAP: 9 (ref 7–17)
BKR BLOOD UREA NITROGEN: 67 mg/dL — ABNORMAL HIGH (ref 6–20)
BKR BUN / CREAT RATIO: 36.2 — ABNORMAL HIGH (ref 8.0–23.0)
BKR CALCIUM: 10.6 mg/dL — ABNORMAL HIGH (ref 8.8–10.2)
BKR CHLORIDE: 109 mmol/L — ABNORMAL HIGH (ref 98–107)
BKR CO2: 28 mmol/L (ref 20–30)
BKR CREATININE: 1.85 mg/dL — ABNORMAL HIGH (ref 0.40–1.30)
BKR EGFR (AFR AMER): 46 mL/min/{1.73_m2} (ref >60)
BKR EGFR (NON AFRICAN AMERICAN): 38 mL/min/{1.73_m2} (ref >60)
BKR GLUCOSE: 226 mg/dL — ABNORMAL HIGH (ref 70–100)
BKR POTASSIUM: 4.8 mmol/L (ref 3.3–5.3)
BKR SODIUM: 146 mmol/L — ABNORMAL HIGH (ref 136–144)

## 2020-11-16 LAB — PARTIAL THROMBOPLASTIN TIME     (BH GH LMW Q YH): BKR PARTIAL THROMBOPLASTIN TIME: 45 s — ABNORMAL HIGH (ref 23.0–31.4)

## 2020-11-16 LAB — TREPONEMA PALLIDUM (SYPHILIS) ANTIBODY W/REFLEX
BKR TREPONEMA PALLIDUM ANTIBODY INITIAL RESULT: <0.1 {index}
BKR TREPONEMA PALLIDUM ANTIBODY TOTAL, SERUM: NONREACTIVE

## 2020-11-16 LAB — PHOSPHORUS     (BH GH L LMW YH): BKR PHOSPHORUS: 3.9 mg/dL (ref 2.2–4.5)

## 2020-11-16 LAB — MAGNESIUM: BKR MAGNESIUM: 2.4 mg/dL (ref 1.7–2.4)

## 2020-11-16 LAB — T4, FREE: BKR FREE T4: 0.86 ng/dL

## 2020-11-16 MED ORDER — VANCOMYCIN MAR LEVEL
Freq: Once | INTRAVENOUS | Status: DC
Start: 2020-11-16 — End: 2020-11-17

## 2020-11-16 MED ORDER — OXYCODONE IMMEDIATE RELEASE 5 MG TABLET
5 mg | GASTROSTOMY | Status: DC | PRN
Start: 2020-11-16 — End: 2020-11-18
  Administered 2020-11-16 – 2020-11-18 (×2): 5 mg via GASTROSTOMY

## 2020-11-16 MED ORDER — VANCOMYCIN IVPB (2 G IN 500ML NS)
INTRAVENOUS | Status: DC
Start: 2020-11-16 — End: 2020-11-16

## 2020-11-16 MED ORDER — HEPARIN, PORCINE (PF) 100 UNIT/ML IN 0.9% SODIUM CHLORIDE IV SYRINGE
100 unit/mL | Status: DC | PRN
Start: 2020-11-16 — End: 2020-11-23

## 2020-11-16 MED ORDER — OXYCODONE IMMEDIATE RELEASE 5 MG TABLET
5 mg | GASTROSTOMY | Status: DC | PRN
Start: 2020-11-16 — End: 2020-11-18

## 2020-11-16 MED ORDER — VANCOMYCIN THERAPY PLACEHOLDER
INTRAVENOUS | Status: DC
Start: 2020-11-16 — End: 2020-11-17

## 2020-11-16 MED ORDER — PIPERACILLIN-TAZOBACTAM (ZOSYN) 4.5GM MBP
Freq: Four times a day (QID) | INTRAVENOUS | Status: DC
Start: 2020-11-16 — End: 2020-11-17
  Administered 2020-11-16 – 2020-11-17 (×5): 100.000 mL/h via INTRAVENOUS

## 2020-11-16 MED ORDER — VANCOMYCIN IVPB (2 G IN 500ML NS)
Freq: Once | INTRAVENOUS | Status: CP
Start: 2020-11-16 — End: ?
  Administered 2020-11-16: 18:00:00 500.000 mL/h via INTRAVENOUS

## 2020-11-16 MED ORDER — SODIUM CHLORIDE 0.9 % (FLUSH) INJECTION SYRINGE
0.9 % | Status: DC | PRN
Start: 2020-11-16 — End: 2020-11-23

## 2020-11-16 NOTE — Plan of Care
Plan of Care Overview/ Patient Status    Pt waxing and waning, somnolent most of the shift, will open eyes to stimulation, b/l wrist soft restraints to prevent pulling at lines, does not tract, does not follow commands. On A/C settings, labored abdominal breathing, PRN oxycodone and dilaudid administered with some effect, RR between 30-40s with increase to high 40s with movement and/or suctioning, team aware. Pt sinus tachy at rest between 100-125s, with increase to 130s when repositioned or suctioned. Pt suctioned q1-2 hours for tan/green thick sputum. Trach area macerated/grey and moist, exudry and xeroform applied with trach care. Febrile in the evening to 100.13F, team aware, tylenol PRN administered, awaiting results. Heparin gtt at 16u/kg/hr, x2 therapeutic. IV abx administered. Foley replaced due to leakage at the bag, clear yellow urine. One BM today, incont derm to buttocks, cream applied, PUP to the coccyx. TF and FWF per orders, tolerating well. +3/+4 edema to b/l upper extremities, elevated on pillows. Offloading boots applied to heels. Pt q2h T/R with wedge.

## 2020-11-16 NOTE — Plan of Care
Plan of Care Overview/ Patient Status    1900-0700Pt does not track, only withdraws from pain. Pt is able to move upper extremities well. Pt on bilateral wrist restraints due to pulling on lines. Pt had 2 episodes of desat, both while laying down for overnight bathing care. Pt desatted into low 70s, RT made aware, pt recovered after increase in fio2 of 50%. Pt suctioned multiple times an hour for thin brown tan secretions. Due to desatting episodes, [place] scans were not completed. Pt not stable to lay flat during transport. On tele, sinus tachy in the 110s-130s. Heparin drip running, therapeutic. Pt showed signs of anxiety, prn ativan given at 0054 w/ good effect. Dilaudid given prn for anxiety at 0400 w/ good effect.  Pt incont of B/b, foley in place w/ good urine output. No bm overnight. Q2h T&P completed.

## 2020-11-16 NOTE — Plan of Care
Inpatient Occupational Therapy Evaluation    Default Flowsheet Data (most recent)       IP Adult OT Eval/Treat - 11/16/20 1600          Date of Visit / Treatment    Date of Visit / Treatment 11/16/20     Note Type Evaluation     Progress Report Due 11/30/20     End Time 1600        General Information    Pertinent History Of Current Problem Per chart, Arthur [name] is a 55 y.o. male with PMH DM, tobacco and EtOH use, Waldenstrom macroglobulinemia, CKD3, laryngeal SCC diagnosed 06/2020 and managed with cisplatin + RT, s/p trach and PEG, who presented to [facility] for dyspnea. He was found with progression of malignancy with encroachment of tumor on his trachea. There was concern for obstruction at [facility] so he was transferred to ENT at [facility] for trach management. Scope notable for partial distal obstruction. On day 5 of admission he had respiratory distress requiring ventilator and SICU transfer. SICU course complicated by incidental R IJ thrombus at port, MRSA VAP, urinary retention, and multiple RRTs for desat 2/2 trach occlusion/obstruction and mucous plugging. Transferred to pulm stepdown for vent weaning 11/13/2020.     Subjective No interactions w/ environment     Referring Physician  Dr. Westley Hummer     General Observations RN cleared pt to be seen, upon arrival pt tachypneic to 40s, HR 130s, belly movement noted on vent (volume control); RN notified and came to bedside to provide suction and therapist assisted w/ repositioning w/ some improvement. Per chart pt s/p RRT overnight for bradycardia (HR 40s) and then tachypnea with tachycardia episode.     Precautions/Limitations fall precautions;bed alarm;oxygen therapy device and L/min     Fall History --   UTA    Hand Dominance --   UTA       Weight Bearing Status    Weight Bearing Status WNL - Within normal limits        Prior Level of Functioning/Social History    Additional Comments Unclear, pt unable to provide, pt admitted from STR        Vital Signs and Orthostatic Vital Signs    Vital Signs Free text Pt tachypneic to high 40s, HR up to high 130s, SPO2 91%, RN came to bedside, provided suctioning, and present at end of session        Pain/Comfort    Pain Comment (Pre/Post Treatment Pain) UTA        Cognition    Overall Cognitive Status Impaired     Cognition Comments Pt w/ eyes open, not following commands, not tracking, appeared in distress (RN present)        Vision/ Hearing    Vision Assessment Comments UTA, does not track or fixate        Range of Motion    Range of Motion Comments PROM grossly WFLs, edematous throughout, limited assessment on this date due to respiratory distress        Musculoskeletal    Musculoskeletal Comments No active motors noted to BUEs however pt appeared to attempt to assist scooting at the trunk when repositioning in bed        Coordination    Opposition Impaired bilaterally        Peripheral Neurovascular    Peripheral Neurovascular Comments Grossly edematous        Sensory Assessment    Sensory Tests Results Unable to assess  Skin Assessment    Skin Assessment See Nursing Documentation        Balance    Balance Skills Training Comment UTA         AM-PAC - Daily Activity IP Short Form    Help needed from another person putting on/taking off regular lower body clothing 1 - Unable     Help needed from another person for bathing (incl. washing, rinsing, drying) 1 - Unable     Help needed from another person for toileting (incl. using toilet, bedpan, urinal) 1 - Unable     Help needed from another person putting on/taking off regular upper body clothing 1 - Unable     Help needed from another person taking care of personal grooming such as brushing teeth 1 - Unable     Help needed from another person eating meals 1 - Unable     AM-PAC Daily Activity Raw Score (Total of rows above) 6     CMS Score (based on Raw Score - with G Code) 6 - 100% impaired     (G Code - CN)        Bed Mobility    Bed Mobility Comments Total assist of 2 for repositioning        Handoff Documentation    Handoff Patient in bed;Bed alarm;Wrist restraints;Discussed with nursing     Handoff Comments RN present        Clinical Impression    Initial Assessment OT evaluation initiated. Assessment limited due to respiratory distress upon receiving pt. RN made aware and present. Assisted w/ repositioning. Pt did not interact w/ environment throughout eval. Active movement noted only at trunk when boosting pt. GOC unclear per chart review. Will continue to monitor appropriateness for OT and attempt to improve overall engagement with environment and functional participation.     Criteria for Skilled Therapeutic Interventions Met yes;treatment indicated     Rehab Potential fair, will monitor progress closely        Patient/Family Stated Goals    Patient/Family Stated Goal(s) unable to state        Frequency/Equipment Recommendations    OT Frequency 1x per week     What day of week is next treatment expected? Monday   3/21       OT Recommendations for Inpatient Admission    ADL Recommendations assist of 2   bedlevel       Planned Treatment / Interventions    Education Treatment / Interventions Patient Education / Training   OT role       OT Discharge Summary    Disposition Recommendation To be determined pending progress                          Problem: Occupational Therapy Goals  Goal: Occupational Therapy Goals  Description: OT GOALS  1)Pt will be able to localize and fixate 2 seconds on therapist  2)Pt will follow a one step command 25% of the time  3)Pt will demonstrate active motors to BUEs  Outcome: Interventions implemented as appropriate       Cutler Sunday MS OTR/L

## 2020-11-17 ENCOUNTER — Encounter: Admit: 2020-11-17 | Payer: PRIVATE HEALTH INSURANCE

## 2020-11-17 LAB — CBC WITH AUTO DIFFERENTIAL
BKR WAM ABSOLUTE IMMATURE GRANULOCYTES.: 0.19 x 1000/ÂµL (ref 0.00–0.30)
BKR WAM ABSOLUTE LYMPHOCYTE COUNT.: 0.29 x 1000/ÂµL — ABNORMAL LOW (ref 0.60–3.70)
BKR WAM ABSOLUTE NRBC (2 DEC): 0 x 1000/ÂµL (ref 0.00–1.00)
BKR WAM ANALYZER ANC: 9.17 x 1000/ÂµL — ABNORMAL HIGH (ref 2.00–7.60)
BKR WAM BASOPHIL ABSOLUTE COUNT.: 0.04 x 1000/ÂµL (ref 0.00–1.00)
BKR WAM BASOPHILS: 0.3 % (ref 0.0–1.4)
BKR WAM EOSINOPHIL ABSOLUTE COUNT.: 0.28 x 1000/ÂµL (ref 0.00–1.00)
BKR WAM EOSINOPHILS: 2.4 % (ref 0.0–5.0)
BKR WAM HEMATOCRIT (2 DEC): 23.2 % — ABNORMAL LOW (ref 38.50–50.00)
BKR WAM HEMOGLOBIN: 7 g/dL — ABNORMAL LOW (ref 13.2–17.1)
BKR WAM IMMATURE GRANULOCYTES: 1.6 % — ABNORMAL HIGH (ref 0.0–1.0)
BKR WAM LYMPHOCYTES: 2.5 % — ABNORMAL LOW (ref 17.0–50.0)
BKR WAM MCH (PG): 27.1 pg (ref 27.0–33.0)
BKR WAM MCHC: 30.2 g/dL — ABNORMAL LOW (ref 31.0–36.0)
BKR WAM MCV: 89.9 fL (ref 80.0–100.0)
BKR WAM MONOCYTE ABSOLUTE COUNT.: 1.63 x 1000/ÂµL — ABNORMAL HIGH (ref 0.00–1.00)
BKR WAM MONOCYTES: 14.1 % — ABNORMAL HIGH (ref 4.0–12.0)
BKR WAM MPV: 10.9 fL (ref 8.0–12.0)
BKR WAM NEUTROPHILS: 79.1 % — ABNORMAL HIGH (ref 39.0–72.0)
BKR WAM NUCLEATED RED BLOOD CELLS: 0.2 % (ref 0.0–1.0)
BKR WAM PLATELETS: 209 x1000/ÂµL (ref 150–420)
BKR WAM RDW-CV: 18.4 % — ABNORMAL HIGH (ref 11.0–15.0)
BKR WAM RED BLOOD CELL COUNT.: 2.58 M/ÂµL — ABNORMAL LOW (ref 4.00–6.00)
BKR WAM WHITE BLOOD CELL COUNT: 11.6 x1000/ÂµL — ABNORMAL HIGH (ref 4.0–11.0)

## 2020-11-17 LAB — BASIC METABOLIC PANEL
BKR ANION GAP: 9 (ref 7–17)
BKR BLOOD UREA NITROGEN: 70 mg/dL — ABNORMAL HIGH (ref 6–20)
BKR BUN / CREAT RATIO: 33 — ABNORMAL HIGH (ref 8.0–23.0)
BKR CALCIUM: 10.1 mg/dL (ref 8.8–10.2)
BKR CHLORIDE: 111 mmol/L — ABNORMAL HIGH (ref 98–107)
BKR CO2: 28 mmol/L (ref 20–30)
BKR CREATININE: 2.12 mg/dL — ABNORMAL HIGH (ref 0.40–1.30)
BKR EGFR (AFR AMER): 39 mL/min/{1.73_m2} (ref >60)
BKR EGFR (NON AFRICAN AMERICAN): 32 mL/min/{1.73_m2} (ref >60)
BKR GLUCOSE: 237 mg/dL — ABNORMAL HIGH (ref 70–100)
BKR POTASSIUM: 4.4 mmol/L (ref 3.3–5.3)
BKR SODIUM: 148 mmol/L — ABNORMAL HIGH (ref 136–144)

## 2020-11-17 LAB — MAGNESIUM: BKR MAGNESIUM: 2.3 mg/dL (ref 1.7–2.4)

## 2020-11-17 LAB — BLOOD GAS, ARTERIAL (BH YH)
BKR BASE EXCESS ARTERIAL: 6 mmol/L — ABNORMAL HIGH (ref ?–2)
BKR FIO2: 50 %
BKR HCO3, ARTERIAL: 29.7 mmol/L — ABNORMAL HIGH (ref 21.0–28.0)
BKR O2 SATURATION, ARTERIAL: 99 % — ABNORMAL HIGH (ref 94–98)
BKR PATIENT TEMP: 37.8 Cel
BKR PCO2 ARTERIAL: 45 mmHg (ref 32–48)
BKR PH, ARTERIAL: 7.44 U (ref 7.35–7.45)
BKR PO2, ARTERIAL: 141 mmHg — ABNORMAL HIGH (ref 83–108)

## 2020-11-17 LAB — VANCOMYCIN, RANDOM     (BH GH LMW YH)
BKR VANCOMYCIN RANDOM: 16 ug/mL — ABNORMAL HIGH
BKR VANCOMYCIN RANDOM: 13.9 ug/mL

## 2020-11-17 LAB — PARTIAL THROMBOPLASTIN TIME     (BH GH LMW Q YH): BKR PARTIAL THROMBOPLASTIN TIME: 45.3 s — ABNORMAL HIGH (ref 23.0–31.4)

## 2020-11-17 MED ORDER — VANCOMYCIN MAR LEVEL
Freq: Once | INTRAVENOUS | Status: CP
Start: 2020-11-17 — End: ?

## 2020-11-17 MED ORDER — LORAZEPAM 2 MG/ML INJECTION SOLUTION
2 mg/mL | INTRAVENOUS | Status: DC | PRN
Start: 2020-11-17 — End: 2020-11-22
  Administered 2020-11-18 – 2020-11-22 (×8): 2 mL via INTRAVENOUS

## 2020-11-17 NOTE — Other
Nursing Skin/Wound ConsultA wound consult was placed on [facility],  a 55 y.o. male.  Subjective: Patient alert and agreeable to assessment. Problems  Hospital Problems Multidisciplinary Problems Principal Problem:  Laryngeal cancer (HC Code) (HC CODE)  SNOMED [place](R): MALIGNANT TUMOR OF LARYNX  Active Problems:  Chronic ITP (idiopathic thrombocytopenia) (HC Code) (HC CODE)  SNOMED [place](R): CHRONIC IDIOPATHIC THROMBOCYTOPENIC PURPURA    Newly diagnosed venous thromboembolism (VTE)  SNOMED [place](R): THROMBOEMBOLISM OF VEIN    Difficult airway  SNOMED [place](R): EXPECTED DIFFICULT TRACHEAL INTUBATION    Acute hypoxemic respiratory failure (HC Code) (HC CODE)  SNOMED [place](R): ACUTE HYPOXEMIC RESPIRATORY FAILURE   Active Multi-Disciplinary problems:Adult Inpatient Plan of CareFall Injury RiskCommunication Impairment (Artificial Airway)Device-Related Complication Risk (Artificial Airway)Skin and Tissue Injury (Artificial Airway)Aspiration (Enteral Nutrition)Device-Related Complication Risk (Enteral Nutrition)Feeding Intolerance (Enteral Nutrition)Skin Injury Risk IncreasedCoping Ineffective (Oncology Care)Fatigue (Oncology Care)Communication Impairment (Mechanical Ventilation, Invasive)Device-Related Complication Risk (Mechanical Ventilation, Invasive)Nutrition Impairment (Mechanical Ventilation, Invasive)Skin and Tissue Injury (Mechanical Ventilation, Invasive)InfectionPhysical Therapy Goals (10/19/20)Impaired Wound HealingAnxietyPain AcuteDiabetes ComorbidityHypertension ComorbidityCommunication Impairment (Mechanical Ventilation, Invasive)Device-Related Complication Risk (Mechanical Ventilation, Invasive)Inability to Wean (Mechanical Ventilation, Invasive)Nutrition Impairment (Mechanical Ventilation, Invasive)Skin and Tissue Injury (Mechanical Ventilation, Invasive)Ventilator-Induced Lung Injury (Mechanical Ventilation, Invasive)Restraint, Nonbehavioral (Nonviolent)Occupational Therapy Goals (11/16/20) Allergies No Known Allergies Objective: Nutrition: Nutrition Risk Screen: tube feeding or parenteral nutrition Functional Status:Ambulation: 4 - completely dependent                             Transferring: 4 - completely dependentContinence: Voiding Characteristics: ureteral catheter                     GI Signs/Symptoms: stool pattern regularBraden Risk AssessmentSensory Perception: 2-->very limitedMoisture: 2-->very moistActivity: 1-->bedfastMobility: 2-->very limitedNutrition: 3-->adequateFriction and Shear: 1-->problemBraden Score: 11Albumin Date Value Ref Range Status 11/05/2020 2.4 (L) 3.6 - 4.9 g/dL Final 08/65/7846 2.8 (L) 3.6 - 4.9 g/dL Final Prealbumin Date Value Ref Range Status 11/06/2020 11.9 (L) 20.0 - 40.0 mg/dL Final  Physical Exam:  11/17/20 1300 Wound 10/20/20 2000 Incontinence Associated Dermatitis Incontinence excoriation, A. Urijah Raynor 11/17/20 buttocks Date First Assessed/Time First Assessed: 10/20/20 2000   Primary Wound Type: Incontinence Associated Dermatitis  Present on Hospital Admission: No  Comments: Incontinence excoriation, A. [place] Howse 11/17/20  Location: buttocks  PU Related to Device?: No Incision Closure  none Dressing Appearance open to air Drainage Amount none Base partial thickness;red;moist Periwound intact Wound Cleanse and Irrigate cleansed with;soap and water Dressing Care skin barrier agent applied Assessment: Patient assessed with covering RN. Discussed with her that trach site is a tumor team aware.  Buttock with MAD open areas on fleshy aspect of buttocks and is not caused bu pressure. Wound base moist and red with partial thickness tissue loss noted to left buttock.Recommendations: Wound Care:Site:Buttocks Cleanse w/ soap and waterApply skin barrier to wound/periwound area.Frequency: 2 x a day and PRN Nursing InterventionsHeel(s): elevated off resting surface with pillow or protective devices, (elevated on pillow - place pillow vertically under legIncontinence/moisture management care: dimethicone/zinc barrier for moisture management to buttock and coccyx, avoid too many underpads, use moisture wicking underpads, use soft wash clothes,Positioning: with pillows or wedges, HOB elevated no more than 30 degrees (unless contraindicated), turn and reposition every 2 hours (unless contraindicated); pressure redistribution/relief mattress, weight shifts in chair, chair cushion when oob, monitor pressure points for pressure and shearing, monitor tubes and devices for pressure points, pad as neededInstructions/Patient Education: Give patient/family general education on prevention and treatment of skin alteration, instruct to  stay off of affected area, instruct in need to turn and position q2hrs, and give educational hand out on discharge.CommunicationFindings and recommendations communicated to RN SamFindings and recommendations communicated to [facility] Katrinka Blazing, RN WONMHB# [phone]/15/20221:35 PM

## 2020-11-17 NOTE — Progress Notes
[facility] Medicine Progress NoteAttending Provider: Pryor Ochoa, MD Subjective                                                                              Subjective: Interim History: Patient was lethargic and difficult to arouse. He was found to have excessive secretions and required a lot of suctioning. His breathing also known to be laboured. Review of Allergies/Meds/Hx: Review of Allergies/Meds/Hx:I have reviewed the patient's: allergies, current scheduled medications, current infusions, current prn medications and past medical history Objective Objective: Vitals:I have reviewed the patient's current vital signs as documented in the patient's EMR.   and Last 24 hours: Temp:  [100.1 ?F (37.8 ?C)-101 ?F (38.3 ?C)] 100.1 ?F (37.8 ?C)Pulse:  [97-120] 97Resp:  [27-51] 27BP: (97-131)/(47-87) 122/66SpO2:  [93 %-100 %] 98 %I/O's:I have reviewed the patient's current I&O's as documented in the EMR.Gross Totals (Last 24 hours) at 11/17/2020 1506Last data filed at 11/17/2020 1300Intake 2013.36 ml Output 1075 ml Net 938.36 ml Procedures:None Physical Exam Physical ExamConstitutional:     Appearance: He is ill-appearing. HENT:    Head: Normocephalic. Neck:    Comments: Large neck mass with peristomal necrosis and apparent extension distally with pink nodule nearly at sternal notch. Increased dark secretions oozing over onto to the chest. Cardiovascular:    Rate and Rhythm: Normal rate and regular rhythm.    Heart sounds: Normal heart sounds. Pulmonary:    Comments: Coarse vented breath sounds anteriorly. + Abdomino-thoracic breathing Abdominal:    General: Bowel sounds are normal. There is no distension.    Palpations: Abdomen is soft.    Tenderness: There is no abdominal tenderness.    Comments: g-tube without surrounding erythema or edema Musculoskeletal:    Comments: Edematous extremities Skin:   General: Skin is warm and dry. Neurological:    Mental Status: He is alert.    Comments: Makes eye contact.  Otherwise unable to follow simple instructions.Unable to assess orientation Psychiatric:    Comments: Unable to asses ?Labs:I have reviewed the patient's labs within the last 24 hrs.Last 24 hours: Recent Results (from the past 24 hour(s)) POCT Glucose  Collection Time: 11/16/20  5:50 PM Result Value Ref Range  Glucose, Meter 231 (H) 70 - 100 mg/dL POC Glucose (Fingerstick)  Collection Time: 11/16/20 11:11 PM Result Value Ref Range  Glucose, Meter 234 (H) 70 - 100 mg/dL Vancomycin, random     (BH GH LMW YH)  Collection Time: 11/17/20  5:15 AM Result Value Ref Range  Vancomycin Random 16.0 (H) See Comment ug/mL Magnesium  Collection Time: 11/17/20  5:15 AM Result Value Ref Range  Magnesium 2.3 1.7 - 2.4 mg/dL Type and screen  Collection Time: 11/17/20  5:15 AM Result Value Ref Range  ABO Grouping O   Rh Type POS   Antibody Screen NEG  CBC auto differential  Collection Time: 11/17/20  5:15 AM Result Value Ref Range  WBC 11.6 (H) 4.0 - 11.0 x1000/?L  RBC 2.58 (L) 4.00 - 6.00 M/?L  Hemoglobin 7.0 (L) 13.2 - 17.1 g/dL  Hematocrit 16.10 (L) 96.04 - 50.00 %  MCV 89.9 80.0 - 100.0 fL  MCH 27.1 27.0 - 33.0 pg  MCHC 30.2 (L) 31.0 -  36.0 g/dL  RDW-CV 16.1 (H) 09.6 - 15.0 %  Platelets 209 150 - 420 x1000/?L  MPV 10.9 8.0 - 12.0 fL  Neutrophils 79.1 (H) 39.0 - 72.0 %  Lymphocytes 2.5 (L) 17.0 - 50.0 %  Monocytes 14.1 (H) 4.0 - 12.0 %  Eosinophils 2.4 0.0 - 5.0 %  Basophil 0.3 0.0 - 1.4 %  Immature Granulocytes 1.6 (H) 0.0 - 1.0 %  nRBC 0.2 0.0 - 1.0 %  ANC(Abs Neutrophil Count) 9.17 (H) 2.00 - 7.60 x 1000/?L  Absolute Lymphocyte Count 0.29 (L) 0.60 - 3.70 x 1000/?L  Monocyte Absolute Count 1.63 (H) 0.00 - 1.00 x 1000/?L  Eosinophil Absolute Count 0.28 0.00 - 1.00 x 1000/?L  Basophil Absolute Count 0.04 0.00 - 1.00 x 1000/?L  Absolute Immature Granulocyte Count 0.19 0.00 - 0.30 x 1000/?L  Absolute nRBC 0.00 0.00 - 1.00 x 1000/?L Basic metabolic panel  Collection Time: 11/17/20  5:15 AM Result Value Ref Range  Sodium 148 (H) 136 - 144 mmol/L  Potassium 4.4 3.3 - 5.3 mmol/L  Chloride 111 (H) 98 - 107 mmol/L  CO2 28 20 - 30 mmol/L  Anion Gap 9 7 - 17  Glucose 237 (H) 70 - 100 mg/dL  BUN 70 (H) 6 - 20 mg/dL  Creatinine 0.45 (H) 4.09 - 1.30 mg/dL  Calcium 81.1 8.8 - 91.4 mg/dL  BUN/Creatinine Ratio 78.2 (H) 8.0 - 23.0  eGFR (Afr Amer) 39 >60 mL/min/1.35m2  eGFR (NON African-American) 32 >60 mL/min/1.57m2 POCT Glucose  Collection Time: 11/17/20  5:23 AM Result Value Ref Range  Glucose, Meter 195 (H) 70 - 100 mg/dL Partial thromboplastin time  Collection Time: 11/17/20  8:36 AM Result Value Ref Range  PTT 45.3 (H) 23.0 - 31.4 seconds POCT Glucose  Collection Time: 11/17/20 11:51 AM Result Value Ref Range  Glucose, Meter 223 (H) 70 - 100 mg/dL Blood gas, arterial (BH YH)  Collection Time: 11/17/20  2:22 PM Result Value Ref Range  pH Arterial 7.44 7.35 - 7.45 units  pCO2, Arterial 45 32 - 48 mmHg  pO2, Arterial 141 (H) 83 - 108 mmHg  O2 Sat, Arterial 99 (H) 94 - 98 %  Calculated HCO3, Arterial 29.7 (H) 21.0 - 28.0 mmol/L  Base Excess, Arterial 6 (H) -2 - 3 mmol/L  Patient Temperature 37.8 Celsius  FIO2 50 % Diagnostics:No new radiology.ECG/Tele Events: No ECG ordered today Assessment Assessment: Assessment:Atha Brosky is a 55 y.o. 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 He was found with progression of malignancy with encroachment of tumor on his trachea. There was concern for obstruction at [facility] so he was transferred to ENT at [facility] for trach management. Scope notable for partial distal obstruction. On day 5 of admission he had respiratory distress requiring ventilator and SICU transfer. SICU course complicated by incidental R IJ thrombus at port, MRSA VAP, urinary retention, and multiple RRTs for desat 2/2 trach occlusion/obstruction and mucous plugging. Transferred to pulm stepdown for vent weaning 11/13/2020.Principal Problem:  Laryngeal cancer (HC Code) (HC CODE)  SNOMED [place](R): MALIGNANT TUMOR OF LARYNX  Active Problems:  Chronic ITP (idiopathic thrombocytopenia) (HC Code) (HC CODE)  SNOMED [place](R): CHRONIC IDIOPATHIC THROMBOCYTOPENIC PURPURA    Newly diagnosed venous thromboembolism (VTE)  SNOMED [place](R): THROMBOEMBOLISM OF VEIN    Difficult airway  SNOMED [place](R): EXPECTED DIFFICULT TRACHEAL INTUBATION    Acute hypoxemic respiratory failure (  HC Code) (HC CODE)  SNOMED [place](R): ACUTE HYPOXEMIC RESPIRATORY FAILURE   Plan Plan: Acute on Chronic hypoxemic respiratory FailureAirway obstruction from laryngeal SCCTumor invasion of stoma with partial distal obstruction Mucous plugging 2/2 tumor necrosisPERTINENT TRACH INFORMATIONService who placed trach: ENTDate of placement:   PTA Date of last routine trach change:  Airway 11/11/20 1100 tracheostomy-Date Tracheostomy Changed: 11/11/20 Type of trach:    #8 hyperflex bivona to bypass obstruction. Eventual plan is custom bivona- continue albuterol nebs TID followed by saline nebs TID followed by chest PT- continue suctioning prn - Vent settings adjusted to PEEP of 12, Flow to 90 by pulmonary to assist in patient overcoming the obstruction of the tumor. Will continue to monitor. - ABG this AM stable. - continue on vanc/zosyn for empiric therapy of possible VAP. CXR findings concerning for stable RUL and new similar opacity in the LLL. Small pleural effusion also noted. - holding PSV trials at this time- lower sputum cultures + for staph on 3/12 -> pending susceptibilities. - [place] neck chest  Pending - ativan IV PRN for respiratory distress # L laryngeal SCC# s/p trach and peg- invasion into thyroid gland, trachea, superior mediastinum, esophaguls, hypopharynx, b/l SCM muscles and abuts C4-7. - S/p palliative radiation x 6, decadron 2/22-2/24- ENT -> tumor board -> no curative or meaningful surgical options at this time. - pending repeat [place] head and neck eval -> patient hasn't been stable to go down for the [place]. - many RRT's due to necrotic tissue from the tumor causing obstruction of the trach. - palliative care on board-> ongoing GOC discussion with sister today due to patient's poor mentation and unable to make decisions for himself as well as worsening progression of his respiratory status along with  Tumor necrosis resulting in obstruction and increased secretions-> overall poor prognosis. # Hypernatremia- improving with FWF - holding further diuresis at this time. # AKI on CKD- Cr increased today to 2.12, baseline is closer to 1.8-1.9- increased FWF to 300cc q4h and will continue to trend. - holding lasix and losartan at this time R IJ thrombus at portChronic ITPWaldenstrom macroglobulinemiaHeparin initially deferred due to low platelets, heme evaluated, heparin gtt started 3/8.- High risk heparin gtt - Re-consult heme for ITP-directed therapy (dex or IVIG) in case of bleeding, pre-procedural, or if PLT < 30k. - transfuse plts goal >30?Anemia of critical illness/inflammation- Maintain Hgb >7?T2DMWas on insulin gtt in SICU (2/28 -3/11)-?Lantus?40u?BID- high dose LISS ?Anxiety / Depression- Clonidine taper?given prolonged precedex used- Clonazepam 0.5mg  BID- Ativan 1mg  IV Q4h PRN for agitation and respiratory distress - Appreciate palliative care and psych consults?HTN / HLD- lasix 40mg  po held - hold losartan- statin?Diet: TF no tray (nepro for hyperkalemia)Med Rec:??completed by pharmacist PPX:?heparin gtt for DVT, pepcid for PUD ppxCode Status: No Code/DNR/No ACLSDispo: uncertain, pending course and GOC?Electronically Signed:Ishi Danser Westley Hummer, MD 11/17/2020,

## 2020-11-17 NOTE — Plan of Care
Inpatient Physical Therapy Progress Note     IP Adult PT Eval/Treat - 11/17/20 0929          Date of Visit / Treatment    Date of Visit / Treatment 11/17/20     End Time 9:29        General Information    Subjective Patient with no response to environment.     General Observations Patient presented in supine with head of bed elevated on 10-7 with a peripheral intravenous, telemetry, pulse oximetry, blood pressure cuff, trach to vent on AC and Foley catheter. Patient also with bilateral wrist restraints. RN cleared for physical therapy.     Precautions/Limitations fall precautions;bed alarm     Precautions/Limitations Comment contact precautions        Vital Signs and Orthostatic Vital Signs    Vital Signs Free text vital signs stable throughout treatment session, RT performed vent tubing exchange        Pain/Comfort    Pain Comment (Pre/Post Treatment Pain) Patient with no signs or symptoms indicative of pain.        Cognition    Orientation Level Disoriented X4     Level of Consciousness lethargic     Following Commands Unable to answer questions;Unable to follow any commands     Personal Safety / Judgment Fall risk;Decreased recognition / insight of own deficits     Cognition Comments Intermittent eye opening. Patient not following commands and no fixating/tracking.        Range of Motion    Range of Motion Comments PROM throughout BUE/BLE WFL. Patient edematous throughout.        Manual Muscle Testing    Manual Muscle Testing Comments Patient with no active motors throughout treatment session.        Peripheral Neurovascular    Peripheral Neurovascular Comments Grossly edematous throughout.        Sensory Assessment    Sensory Tests Results Unable to assess        Skin Assessment    Skin Assessment Comments See nursing assessment for details.        Balance    Balance Skills Training Comment Unable to assess functional mobility.        Bed Mobility    Bed Mobility Comments Unable to assess functional mobility. Patient lethargic and with no command following or active motors demonstrated.        Handoff Documentation    Handoff Patient in bed;Bed alarm;Wrist restraints;Discussed with nursing        PT- AM-PAC - Basic Mobility Screen- How much help from another person do you currently need.....    Turning from your back to your side while in a a flat bed without using rails? 1 - Total - Requires total assistance or cannot do it at all.     Moving from lying on your back to sitting on the side of a flat bed without using bed rails? 1 - Total - Requires total assistance or cannot do it at all.     Moving to and from a bed to a chair (including a wheelchair)? 1 - Total - Requires total assistance or cannot do it at all.     Standing up from a chair using your arms(e.g., wheelchair or bedside chair)? 1 - Total - Requires total assistance or cannot do it at all.     To walk in a hospital room? 1 - Total - Requires total assistance or cannot do it at all.  Climbing 3-5 steps with a railing? 1 - Total - Requires total assistance or cannot do it at all.     AMPAC Mobility Score 6     TARGET Highest Level of Mobility Mobility Level 2, Turn self in bed/bed activity/dependent transfer         Therapeutic Exercise    Therapeutic Exercise Comments PROM x 4 extremities throughout all planes.        Clinical Impression    Follow up Assessment Patient with impaired cognition, no command following, no active motors and was grossly edematous. Patient will benefit from continued physical therapy to address ability to participate with physical therapy, skin integrity/repositioning and for assistance with discharge planning.     Criteria for Skilled Therapeutic Interventions Met treatment indicated        Patient/Family Stated Goals    Patient/Family Stated Goal(s) unable to state        Frequency/Equipment Recommendations    PT Frequency 1x per week     What day of week is next treatment expected? Tuesday   week of 11/24/2020       PT Recommendations for Inpatient Admission    Activity/Level of Assist assist of 2     ADL Recommendations assist of 2     Positioning reposition frequently     Therapeutic Exercise ROM as tolerated        PT Discharge Summary    Physical Therapy Disposition Recommendation Rehab at [facility]                        Charlestine Massed, PT [facility] [phone]

## 2020-11-17 NOTE — Progress Notes
[facility] Medicine Progress NoteAttending Provider: Pryor Ochoa, MD Subjective                                                                              Subjective: Interim History: Patient was lethargic and difficult to arouse. He was found to have excessive secretions and required a lot of suctioning. His breathing also known to be laboured. Review of Allergies/Meds/Hx: Review of Allergies/Meds/Hx:I have reviewed the patient's: allergies, current scheduled medications, current infusions, current prn medications and past medical history Objective Objective: Vitals:I have reviewed the patient's current vital signs as documented in the patient's EMR.   and Last 24 hours: Temp:  [98.5 ?F (36.9 ?C)-100.6 ?F (38.1 ?C)] 100.5 ?F (38.1 ?C)Pulse:  [105-128] 105Resp:  [23-43] 32BP: (101-188)/(54-97) 131/80SpO2:  [95 %-100 %] 97 %I/O's:I have reviewed the patient's current I&O's as documented in the EMR.Gross Totals (Last 24 hours) at 11/16/2020 2002Last data filed at 11/16/2020 1800Intake 2075 ml Output 2000 ml Net 75 ml Procedures:None Physical Exam Physical ExamConstitutional:     Appearance: He is ill-appearing. HENT:    Head: Normocephalic. Neck:    Comments: Large neck mass with peristomal necrosis and apparent extension distally with pink nodule nearly at sternal notchCardiovascular:    Rate and Rhythm: Normal rate and regular rhythm.    Heart sounds: Normal heart sounds. Pulmonary:    Comments: Coarse vented breath sounds anteriorlyAbdominal:    General: Bowel sounds are normal. There is no distension.    Palpations: Abdomen is soft.    Tenderness: There is no abdominal tenderness.    Comments: g-tube without surrounding erythema or edema Musculoskeletal:    Comments: Edematous extremities Skin:   General: Skin is warm and dry. Neurological:    Mental Status: He is alert. Comments: Makes eye contact.  Otherwise unable to follow simple instructions.Unable to assess orientation Psychiatric:    Comments: Unable to asses ?Labs:I have reviewed the patient's labs within the last 24 hrs.Last 24 hours: Recent Results (from the past 24 hour(s)) POC Glucose (Fingerstick)  Collection Time: 11/15/20 11:39 PM Result Value Ref Range  Glucose, Meter 153 (H) 70 - 100 mg/dL POC Glucose (Fingerstick)  Collection Time: 11/16/20  4:46 AM Result Value Ref Range  Glucose, Meter 99 70 - 100 mg/dL Magnesium  Collection Time: 11/16/20  5:07 AM Result Value Ref Range  Magnesium 2.4 1.7 - 2.4 mg/dL Phosphorus     (BH GH L LMW YH)  Collection Time: 11/16/20  5:07 AM Result Value Ref Range  Phosphorus 3.9 2.2 - 4.5 mg/dL CBC auto differential  Collection Time: 11/16/20  5:07 AM Result Value Ref Range  WBC 10.3 4.0 - 11.0 x1000/?L  RBC 3.19 (L) 4.00 - 6.00 M/?L  Hemoglobin 8.5 (L) 13.2 - 17.1 g/dL  Hematocrit 16.10 (L) 96.04 - 50.00 %  MCV 89.7 80.0 - 100.0 fL  MCH 26.6 (L) 27.0 - 33.0 pg  MCHC 29.7 (L) 31.0 - 36.0 g/dL  RDW-CV 54.0 (H) 98.1 - 15.0 %  Platelets 211 150 - 420 x1000/?L  MPV 11.5 8.0 - 12.0 fL  Neutrophils 77.3 (H) 39.0 - 72.0 %  Lymphocytes 2.7 (L) 17.0 - 50.0 %  Monocytes 16.2 (H) 4.0 -  12.0 %  Eosinophils 2.1 0.0 - 5.0 %  Basophil 0.4 0.0 - 1.4 %  Immature Granulocytes 1.3 (H) 0.0 - 1.0 %  nRBC 0.0 0.0 - 1.0 %  ANC(Abs Neutrophil Count) 7.95 (H) 2.00 - 7.60 x 1000/?L  Absolute Lymphocyte Count 0.28 (L) 0.60 - 3.70 x 1000/?L  Monocyte Absolute Count 1.67 (H) 0.00 - 1.00 x 1000/?L  Eosinophil Absolute Count 0.22 0.00 - 1.00 x 1000/?L  Basophil Absolute Count 0.04 0.00 - 1.00 x 1000/?L  Absolute Immature Granulocyte Count 0.13 0.00 - 0.30 x 1000/?L  Absolute nRBC 0.00 0.00 - 1.00 x 1000/?L Basic metabolic panel  Collection Time: 11/16/20  5:07 AM Result Value Ref Range  Sodium 146 (H) 136 - 144 mmol/L  Potassium 4.8 3.3 - 5.3 mmol/L  Chloride 109 (H) 98 - 107 mmol/L  CO2 28 20 - 30 mmol/L  Anion Gap 9 7 - 17  Glucose 226 (H) 70 - 100 mg/dL  BUN 67 (H) 6 - 20 mg/dL  Creatinine 1.61 (H) 0.96 - 1.30 mg/dL  Calcium 04.5 (H) 8.8 - 10.2 mg/dL  BUN/Creatinine Ratio 40.9 (H) 8.0 - 23.0  eGFR (Afr Amer) 46 >60 mL/min/1.72m2  eGFR (NON African-American) 38 >60 mL/min/1.56m2 Partial thromboplastin time  Collection Time: 11/16/20  5:07 AM Result Value Ref Range  PTT 45.0 (H) 23.0 - 31.4 seconds POCT Glucose  Collection Time: 11/16/20  1:47 PM Result Value Ref Range  Glucose, Meter 232 (H) 70 - 100 mg/dL POCT Glucose  Collection Time: 11/16/20  5:50 PM Result Value Ref Range  Glucose, Meter 231 (H) 70 - 100 mg/dL Diagnostics:No new radiology.ECG/Tele Events: No ECG ordered today Assessment Assessment: Assessment:Jesstin Bundren is a 55 y.o. 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 He was found with progression of malignancy with encroachment of tumor on his trachea. There was concern for obstruction at [facility] so he was transferred to ENT at [facility] for trach management. Scope notable for partial distal obstruction. On day 5 of admission he had respiratory distress requiring ventilator and SICU transfer. SICU course complicated by incidental R IJ thrombus at port, MRSA VAP, urinary retention, and multiple RRTs for desat 2/2 trach occlusion/obstruction and mucous plugging. Transferred to pulm stepdown for vent weaning 11/13/2020.Principal Problem:  Laryngeal cancer (HC Code) (HC CODE)  SNOMED [place](R): MALIGNANT TUMOR OF LARYNX  Active Problems:  Chronic ITP (idiopathic thrombocytopenia) (HC Code) (HC CODE)  SNOMED [place](R): CHRONIC IDIOPATHIC THROMBOCYTOPENIC PURPURA    Newly diagnosed venous thromboembolism (VTE)  SNOMED [place](R): THROMBOEMBOLISM OF VEIN    Difficult airway  SNOMED [place](R): EXPECTED DIFFICULT TRACHEAL INTUBATION    Acute hypoxemic respiratory failure (HC Code) (HC CODE)  SNOMED (R): ACUTE HYPOXEMIC RESPIRATORY FAILURE   Plan Plan: Acute on Chronic hypoxemic respiratory FailureAirway obstruction from laryngeal SCCTumor invasion of stoma with partial distal obstruction Mucous plugging 2/2 tumor necrosisPERTINENT TRACH INFORMATIONService who placed trach: ENTDate of placement:   PTA Date of last routine trach change:  Airway 11/11/20 1100 tracheostomy-Date Tracheostomy Changed: 11/11/20 Type of trach:    #8 hyperflex bivona to bypass obstruction. Eventual plan is custom bivona- continue albuterol nebs TID followed by saline nebs TID followed by chest PT- continue suctioning prn - Vent settings adjusted to PEEP of 12- Check ABG in the AM- continue on vanc/zosyn for empicir therapy of possible VAP. CXR findings concerning for stable RUL and  new similar opacity in the LLL. Small pleural effusion also noted. - holding PSV trials at this time- lower sputum cultures + for staph on 3/12 -> pending susceptibilities. 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 - S/p palliative radiation x 6, decadron 2/22-2/24- ENT -> tumor board -> no curative  Or meaningful surgical options at this time. - pending repeat [place] head and neck eval - many RRT's due to necrotic tissue from the tumor causing obstruction of the trach. # Hypernatremia- improving with FWF # AKI on CKD- Cr stable at this time with new baseline of 1.9R IJ thrombus at portChronic ITPWaldenstrom macroglobulinemiaHeparin initially deferred due to low platelets, heme evaluated, heparin gtt started 3/8.- High risk heparin gtt - Re-consult heme for ITP-directed therapy (dex or IVIG) in case of bleeding, pre-procedural, or if PLT < 30k. - transfuse plts goal >30?Anemia of critical illness/inflammation- Maintain Hgb >7?T2DMWas on insulin gtt in SICU (2/28 -3/11)-?Lantus?40u?BID- high dose LISS ?Anxiety / Depression- Clonidine taper?given prolonged precedex used- Clonazepam 0.5mg  BID- Ativan 1mg  IV Q6h PRN; could consider increasing to q4 PRN- Appreciate palliative care and psych consults?HTN / HLD- lasix 40mg  po resumed- resume losartan- statin?Diet: TF no tray (nepro for hyperkalemia)Med Rec:??completed by pharmacist PPX:?heparin gtt for DVT, pepcid for PUD ppxCode Status: No Code/DNR/No ACLSDispo: uncertain, pending course and GOC?Electronically Signed:Ayana Imhof Westley Hummer, MD 11/16/2020, 7:45 PM

## 2020-11-17 NOTE — Procedures
[facility] CenterIn[facility], [place], [place] RaleighMRN: [id number] Date of Birth: [birth year]			Date of Study:	11/16/2020	Start time:	10:32 	Finish time:	11:37	Duration: >65minutesLeads: 25 (including inferior temporal chains)	Type: inpatient portable video-EEG 	Campus: [address]  	Service:MedicineRequesting Provider: Pryor Ochoa, MDReason for EEG:  This is a 55 y.o. 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 On day 5 of admission he had respiratory distress requiring ventilator and SICU transfer. SICU course complicated by R IJ thrombus, MRSA VAP, urinary retention with severe anxious episodes during weaning trials. EEG requested to evaluate for potential interictal epileptiform discharges/ non convulsive seizures. Prior EEGs (Most recent 3 studies, within the past 5 years)NoneRelevant Medications:Lorazepam, 1 mg q6hHydromorphoneInterpretation: Background activity: The awake background rhythm was characterized by continuous 2-3 Hz delta w/ admixed 4-5 Hz theta frequency waveforms at normal voltages. There was no posterior dominant rhythm.Symmetry & focal abnormalities: Symmetric with no focal abnormalities.Epileptiform activity: No epileptiform patterns or seizures.Other abnormal activity: NoneSleep rhythms: Awake only, no N1 or N2 sleep transients. Activation procedures: None performedEKG: Regular rate.Video events: No clinical eventsEpilepsy & EEG Attending Summary:Abnormal extended (>60 minute) awake-only inpatient video EEG due to:- Moderate to severe generalized slowing suggesting diffuse or multifocal dysfuntion- No seizures or epileptiform patterns.No prior EEG for comparisonInterpreted by:  Tressie Stalker, MD

## 2020-11-17 NOTE — Significant Event
Held a family meeting with Raynelle Fanning from Palliative Care, myself and [name] (sister) at bedside. [name] also called her niece [name], to participate in the meeting. Emotional support provided. Clinical status updated and discussed in depth regarding progression of his cancer and the complications that are progressing. All questions, concerns were addressed. Family decided to pursue CMO status. However, requesting till Thursday - so [name] can come. Discussed that at this time, we will initiate hospice/CMO status. Will continue tube feeds at this time and Vent. She is aware that the vent settings will no longer be readjusted at this time. They also agreed to discontinue other medications that are not needed and will continue those medications to assist the patient in remaining comfortable and no pain. Code status has been updated in Epic. ORders have been readjusted to follow comfort measures and inpatient hospice has been consulted.Jeannie Done.D.Hospitalist Service Beeper: [phone]: [phone]/15/2022

## 2020-11-17 NOTE — Plan of Care
Plan of Care Overview/ Patient Status    0700-1900Pt. Open eyes to voice, don't track, bilateral restraints maintained due to pulling at trach tubes. Continuous heparin drip 16 units, therapeutic. Temp noted tylenol admin. Sinus tachy in 100's up too 110's, 8 Bovona AC: 12 500 50 12 mantained, minimal secretion, Incontinent B&B, diet nepro 55ml/hr, 300 flushes Q4H, foley patent. Pt. Made CMO today, goals of care meeting had w/ sister today. T/P Q2H. Safety precautions maintained. Bed alarm active and audible. See flowsheet for details.

## 2020-11-17 NOTE — Other
[place] [facility]    [facility]     Palliative Care Follow-Up Note    Present History   Chart reviewed and case discussed with primary team, who report concern that Arthur [name] condition is deteriorating, with increased work of breathing. Team worried that he may again require escalation to ICU.     I examined Arthur [name] this morning. He aroused to touch and immediately appeared fearful and anxious, tearful. He seemed to shake his head no when asked whether he is having pain. Briefly pulling at restraints but able to be redirected and again closes his eyes after a few minutes.     I spoke with Arthur [name] sister Arthur [name] this morning to convey team's concerns that his condition is worsening and that he may require transfer to ICU. Arthur [name] has shared her concerns in past discussions that Arthur [name] is suffering and that she would prefer to see him made comfortable but she has opted to continue with current plan of care based on her understanding that this is consistent with Arthur [name]'s wishes. I revisited this discussion today. We discussed that, based on his current plan of care, if necessary Arthur [name] would be transferred back to the ICU. Alternative options would be to opt for no escalation, keeping him where he is and continuing with current measures; or transitioning him to comfort measures, discontinuing invasive interventions and no longer attempting to treat his underlying disease, focusing on treating his symptoms and allowing natural death to occur. Arthur [name] wants to confer with her and Arthur [name]'s niece Arthur [name] before making a decision. She is hoping that she can come to the hospital this afternoon but needs to find someone to stay with her son, who is in the hospital and transitioning to hospice. She asked me to call Arthur [name] to provide her with the same information I had shared with her. I reached voicemail, leaving a HIPAA-compliant message, and notified Arthur [name] that I had been unable to speak with her. She will plan to continue the conversation with her family and either call me or visit later today.    Arthur [name] notified me upon her arrival to the hospital this afternoon, and Attending Dr. Westley Hummer and I joined her at [facility] bedside to continue GOC discussions. Arthur [name] was able to join the discussion via phone. Dr. Westley Hummer provided medical updates and shared concern that Arthur [name]'s disease is likely contributing to his worsening respiratory status. Arthur [name] and Arthur [name] understand that even if Arthur [name]'s care were to be escalated back to the ICU, his condition is irreversible. They are concerned that he is suffering and do not want to prolong his suffering any further. We explained what transition to comfort measures would entail as well as the option of transitioning to virtual hospice. Arthur [name] and Arthur [name] confirmed that they would like to pursue this option. We discussed that we will make Arthur [name], minimizing any further testing and invasive interventions and instead focusing on aggressive symptom management. A referral to hospice will be placed, and Arthur [name] will remain on the ventilator at current vent settings until he is able to be signed on to hospice. Arthur [name] does not think she will be able to visit before Thursday, so the goal at this time is to wait until hospice is instituted and all family who wish to visit can arrange to do so, after which he will be liberated from the vent. We encouraged family to visit at their earliest opportunity, given concerns that Arthur [name]'s respiratory status continues to decline despite current vent  settings. They verbalized understanding. Emotional support provided.     Review of Allergies/Hx/Meds   Allergies  Patient has No Known Allergies.    Medical History  Past Medical History:   Diagnosis Date   ? Diabetes mellitus (HC Code) (HC CODE)    ? Hypercholesteremia    ? Hypertension        Family History  Family History   Problem Relation Age of Onset   ? Aneurysm Mother        Social History  Social History     Socioeconomic History   ? Marital status: Legally Separated     Spouse name: Not on file   ? Number of children: Not on file   ? Years of education: Not on file   ? Highest education level: Not on file   Occupational History   ? Not on file   Tobacco Use   ? Smoking status: Former Smoker     Packs/day: 1.00     Years: 30.00     Pack years: 30.00     Quit date: 03/04/2013     Years since quitting: 7.7   Substance and Sexual Activity   ? Alcohol use: Yes     Alcohol/week: 14.0 standard drinks     Types: 14 Cans of beer per week   ? Drug use: Not on file   ? Sexual activity: Not on file   Other Topics Concern   ? Not on file   Social History Narrative   ? Not on file     Social Determinants of Health     Financial Resource Strain:    ? Difficulty of Paying Living Expenses:    Food Insecurity:    ? Worried About Programme researcher, broadcasting/film/video in the Last Year:    ? Barista in the Last Year:    Transportation Needs:    ? Freight forwarder (Medical):    ? Lack of Transportation (Non-Medical):    Physical Activity:    ? Days of Exercise per Week:    ? Minutes of Exercise per Session:    Stress:    ? Feeling of Stress :    Social Connections:    ? Frequency of Communication with Friends and Family:    ? Frequency of Social Gatherings with Friends and Family:    ? Attends Religious Services:    ? Active Member of Clubs or Organizations:    ? Attends Banker Meetings:    ? Marital Status:    Intimate Partner Violence:    ? Fear of Current or Ex-Partner:    ? Emotionally Abused:    ? Physically Abused:    ? Sexually Abused:        InPatient Medications  Current Facility-Administered Medications   Medication Dose Route Frequency Last Rate   ? acetaminophen  650 mg Per G Tube Q6H PRN     ? albuterol  2.5 mg Nebulization Q6H PRN     ? ascorbic acid  250 mg Per G Tube Daily     ? bisacodyL  10 mg Rectal DAILY PRN     ? carboxymethylcellulose  1 drop Both Eyes TID PRN     ? chlorhexidine gluconate  15 mL Mouth/Throat Q12H     ? clonazePAM  0.5 mg Per J Tube BID     ? famotidine  20 mg Per J Tube Daily     ? folic acid  1 mg  Per G Tube Daily     ? furosemide  40 mg Oral Daily     ? glucagon  1 mg Intramuscular Once PRN     ? heparin 25,000 units/250 ml infusion - High Risk of Bleeding  7-30 Units/kg/hr (Dosing Weight) Intravenous Continuous 16 Units/kg/hr (11/17/20 0408)   ? heparin PF in 0.9 % sodium chloride  5 mL Intra-Catheter PRN for Line Care     ? HYDROmorphone  1 mg IV Push Q3H PRN     ? insulin glargine  40 Units Subcutaneous Q12H     ? insulin regular human   Subcutaneous Q6H     ? labetaloL         ? lisinopriL  10 mg Per G Tube Daily     ? LORazepam  1 mg IV Push Q6H PRN     ? magnesium hydroxide  30 mL Per G Tube Daily     ? miconazole nitrate   Topical (Top) BID     ? midazolam (PF)         ? oxyCODONE  10 mg Per G Tube Q4H PRN     ? oxyCODONE  15 mg Per G Tube Q4H PRN     ? piperacillin-tazobactam  4.5 g Intravenous Q6H 4.5 g (11/17/20 0849)   ? polyethylene glycol  17 g Per G Tube BID     ? amino acids-protein hydrolys  1 packet Per G Tube TID     ? rosuvastatin  10 mg Per G Tube Daily     ? senna  2 tablet Per G Tube BID     ? sodium chloride  10 mL Intra-Catheter PRN for Line Care     ? sodium chloride  20 mL Intra-Catheter PRN for Line Care     ? thiamine  50 mg Per G Tube Daily     ? Vancomycin MAR level   Intravenous Once     ? Vancomycin MAR level   Intravenous Once     ? Vancomycin Therapy Placeholder   Intravenous placeholder     ? zinc oxide  1 Application Topical (Top) DAILY PRN         [email or web address]    Palliative Performance Scale 30%   (Modification of Karnofsky and was designed for measurement of physical status in Palliative Care. Only 10% of patients with PPS score <=50% would be expected to survive >6 months)    Physical Exam  Vitals and nursing note reviewed.   Constitutional:       General: He is in acute distress.      Appearance: He is ill-appearing.      Comments: Briefly alert, then closes eyes   Pulmonary:      Effort: Tachypnea and accessory muscle usage present.      Comments: Trached to vent  Neurological:      Mental Status: He is alert.      Comments: Rouses to touch, becoming tearful, anxious appearing; shakes head no to inquiry about pain         Vitals:  Temp:  [100.3 ?F (37.9 ?C)-101 ?F (38.3 ?C)] 101 ?F (38.3 ?C)  Pulse:  [98-123] 99  Resp:  [28-42] 28  BP: (97-134)/(47-87) 122/87  SpO2:  [93 %-100 %] 98 %  ECOG Performance Status (non-calculated): 3  calc. ECOG (from Karnofsky): 2  Wt Readings from Last 3 Encounters:   11/13/20 (!) 147.9 kg   10/06/20 (!) 139.5 kg   09/29/20 (!) 142.4  kg       Review of Labs/Diagnostics  Complete Blood Count:  Recent Labs   Lab 11/15/20  1825 11/16/20  0507 11/17/20  0515   WBC 10.7 10.3 11.6*   HGB 8.5* 8.5* 7.0*   HCT 27.90* 28.60* 23.20*   PLT 203 211 209     Comprehensive Metabolic Panel:  Recent Labs   Lab 11/15/20  1825 11/15/20  2339 11/16/20  0507 11/16/20  1347 11/16/20  2311 11/17/20  0515 11/17/20  0523   NA 147*  --  146*  --   --  148*  --    K 4.4  --  4.8  --   --  4.4  --    CL 109*  --  109*  --   --  111*  --    CO2 28  --  28  --   --  28  --    BUN 68*  --  67*  --   --  70*  --    CREATININE 1.85*  --  1.85*  --   --  2.12*  --    GLU 151*   < > 226*   < >   < > 237* 195*   CALCIUM 10.6*  --  10.6*  --   --  10.1  --     < > = values in this interval not displayed.     Recent Labs   Lab 11/15/20  1825   ALKPHOS 193*   BILITOT <0.2   BILIDIR <0.2   ALT 49   AST 22       Diagnostics:  XR Chest PA or AP (Portable)    Result Date: 11/16/2020  XR CHEST PA OR AP Date: 11/16/2020 8:31 AM INDICATION: Tachypnea. COMPARISON: XR CHEST PA OR AP 2022-Mar-13; XR CHEST PA OR AP 2022-Mar-12; XR CHEST PA OR AP 2022-Mar-09; XR CHEST PA OR AP 2022-Mar-08 FINDINGS: Support Devices: Unchanged position of tracheostomy tube and right chest port. Unchanged right upper lobe hazy opacity with new similar density opacity in the left lower lobe. Small left pleural effusion. No pneumothorax. The cardiomediastinal silhouette is stable.      Stable right upper lobe hazy opacity with new similar density opacity in the left lower lobe. Stable small left pleural effusion. Reported And Signed By: Blinda Leatherwood, MD  [facility]         Impression/Plan:   Arthur [name] is a 55 y.o. 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 Admitted to SICU on 2/9 for ventilator management following RRT on oncology floor for desaturation and increased work of breathing while on trach mask, ultimately placed back on vent. Referred for Palliative Care Consult by Attending Provider: Alric Quan, MD [phone] for supportive care.    End-of-life care:  - Would escalate orders as needed for more aggressive symptom management to optimize comfort at end of life  - Dilaudid 1 mg IV q3hr PRN for pain or respiratory distress. Consider scheduling Dilaudid q4hr if patient's breathing remains labored or he continues to show cues to pain/distress.   - Ativan 1 mg q4hr PRN agitation/restlessness/respiratory distress.   - Goal is for more aggressive management of agitation so that wrist restraints can be removed. Consider scheduling Ativan around the clock as needed.  - Can  consider Robinul 0.2 mg IV q6hr PRN for terminal secretions  - For constipation: Senna BID, Miralax BID for constipation; Dulcolax suppository daily PRN    Coping/Support:  - Will continue to provide support to patient and family, with ongoing assessments for psychosocial/spiritual needs  - Virtual hospice referral pending for transition to in-patient hospice. In addition to hospice-trained clinicians who can provide further optimization of comfort-based care for patient at end of life, electing the hospice benefit will provide the family with year-long bereavement support.   - Family at risk for complex grief given the simultaneous transition to hospice of Arthur [name]  - Family endorse financial stressors/ concern for losing access to some utilities in [place] name. Social Work aware and following.     Goals of Care:  Transitioning to [name] today. Will continue on current vent settings until arrangements can be made for family to visit to say their goodbyes and hospice can be instituted, after which plan is to liberate from the vent.    Thank you for involving the Palliative Care team in the care of this patient. The Palliative Care team will continue to follow.    Patient was evaluated: in person    Time committed: > 35 minutes were spent in chart review, coordination of care, and at the bedside in patient/family counseling and education, including detailed concepts of symptom managment and goals of care/advanced care planning.    Signed:  Debbrah Alar, APRN  Palliative Care Consult Service  11/17/2020 11:13 AM

## 2020-11-17 NOTE — Plan of Care
Plan of Care Overview/ Patient Status    1900-0700Neuro: Pt open eyes to voice but unable to track with eyes or unable to follow commands. Pt with flat affect and withdrawn. PERRLA. Tmax: 100.3. Bilateral wrist restraints for pt safety. CV: ST, HR 100s, SBP 90s-110s, Maps 50s-65. Q 4 hr vitals maintained. Heparin gtt running at 16. RESP: # 8 shiley, volume AC 12/500/12/50, sating 95-96%, breath sounds diminished. Suctioning required but with minimal secretions. GI: PEG running nepro at 65, q 4hr water flushes continued. Bowel sounds audible. Blood glucose monitored.GU: Foley patentSkin: pt turned and repositioned q 2hrs, please see flowsheet for skin documentation. Will continue to monitor pt safety.

## 2020-11-17 NOTE — Consults
VANCOMYCIN LEVEL EVALUATIONCurrent Vancomycin Order: 2 g IV per laboratory/placeholder data. Day of Therapy: 2Vancomycin Indication: Suspected MRSA infection - VAP Estimated Creatinine Clearance: 45 mL/min (A) (by C-G formula based on SCr of 2.12 mg/dL (H)).Creatinine (mg/dL) Date Value 47/82/9562 2.12 (H) 11/16/2020 1.85 (H) 11/15/2020 1.85 (H)  Renal Function: AKI - SCr up today Level: Lab Results Last 72 Hours Component Value Date/Time  Vancomycin Random 16.0 (H) 11/17/2020 05:15 AM Type of Level: Random (16 hours post-dose)Based on vancomycin level obtained, recommend:  Hold dose until level appropriateRepeat Level:  3/15 @ 1300  - level originally ordered for 24 hours post dose at 1100, drawn early at 0500. The dose was administered on 3/14 @ 1330, and patient close to threshold of being redosed. Will reorder level to be taken today at 1300 to reflect a 24 hour level. ID/AST Consulted? No [facility] Vancomycin Dosing GuidelineBH Vancomycin Dosing GuidelineFor questions, please contact the pharmacist: Milana Huntsman, PharmD    Phone/Mobile Heartbeat: [facility]

## 2020-11-18 MED ORDER — GLYCOPYRROLATE 0.2 MG/ML INJECTION SOLUTION
0.2 mg/mL | Freq: Three times a day (TID) | INTRAVENOUS | Status: DC | PRN
Start: 2020-11-18 — End: 2020-11-21
  Administered 2020-11-19 – 2020-11-20 (×2): 0.2 mL via INTRAVENOUS

## 2020-11-18 MED ORDER — HYDROMORPHONE 2 MG/ML INJECTION SOLUTION
2 mg/mL | INTRAVENOUS | Status: DC | PRN
Start: 2020-11-18 — End: 2020-11-18
  Administered 2020-11-18: 17:00:00 2 mL via INTRAVENOUS

## 2020-11-18 MED ORDER — HYDROMORPHONE 2 MG/ML INJECTION SOLUTION
2 mg/mL | INTRAVENOUS | Status: DC
Start: 2020-11-18 — End: 2020-11-20
  Administered 2020-11-18 – 2020-11-20 (×12): 2 mL via INTRAVENOUS

## 2020-11-18 MED ORDER — LORAZEPAM 2 MG/ML INJECTION SOLUTION
2 mg/mL | Freq: Three times a day (TID) | INTRAVENOUS | Status: DC
Start: 2020-11-18 — End: 2020-11-19
  Administered 2020-11-18 – 2020-11-19 (×2): 2 mL via INTRAVENOUS

## 2020-11-18 MED ORDER — HYDROMORPHONE 2 MG/ML INJECTION SOLUTION
2 mg/mL | INTRAVENOUS | Status: DC | PRN
Start: 2020-11-18 — End: 2020-11-21
  Administered 2020-11-19 – 2020-11-20 (×2): 2 mL via INTRAVENOUS

## 2020-11-18 NOTE — Progress Notes
10-7 Pulmonary Step Down Unit Pulmonary Ventilator Weaning Note--------------------------------------------------Patient Name:  Arthur RaleighDate of Birth:  1967/02/22Date of Service: 11/15/2020 MRN:  [id number] LOS: 37  I saw Arthur [name] in consultation for assistance with ventilator weaning.  Interim:-Bradycardia overnight-Requiring significant suctioning-CXR with worsening RUL consolidations and L pleural effusion-Cr 1.93 --> 1.85-Procalcitonin uptrending-EEG without seizure activityROS : negative except as noted Pertinent Medications: Lasix 40 daily POPhysical Examination:Vital signs:  T: 98.9 ?F (37.2 ?C)  HR: (!) 113  RR: (!) 27 BP: (!) 154/79  O2 sat: 98 %General: NADHEENT: Trach site With tumor encroachment, CDICV: s1s2, no mrgsPulm: course soundsAbdomen: soft nttpExt: No edemaNeuro/psyche: intermittently interactiveTracheostomy Info:Type/Size: 8 hypeflex cuffed BivonaDate of insertion or last change: 3/9/22Service that placed Trach: ENTReview of Data:Labs reviewed.Radiology:Imaging reviewed.Notable for:[place]ck 2/10Tracheostomy tube in place with tip terminating approximately 4 cm above the carina.Similar large necrotic transposition on mass with invasion into the thyroid gland, trachea, hypopharynx, thyroid cartilage and cervical esophagus with extensive cervical mediastinal and supraclavicular lymphadenopathy.Similar partially occluding thrombus in the right internal jugular vein. Impression:Arthur [name] is a 55 y.o. 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  He is also status post trach and PEG. She presented to [facility] for shortness of breath, noted to have a circumferential necrotic mass at the trach stoma with concern for tumor encroachment.  He was transferred to [facility] for trach management and noted to have partial obstruction.  He had an episode of respiratory distress and was placed on the ventilator and transferred to the SICU.  His SICU course was complicated by MRSA ventilator associated pneumonias and multiple RRTs for desaturation secondary to trach occlusion from mucus plugging.  He is transferred to 10 7 for ventilator weaning on 11/13/2020.Appears to be clinically worsening and has limited therapeutic options. Appears uncomfortable. CXR with worsening L sided infilitarte and uptrending Procalcitonin. Would recommend broad spectrum coverage. Additionally, patient appears to have some evidence of airway collapse on [place] chest, will plan to increase PEEP and see if that optimizes driving pressure.Hypoxemic Episodes - mucus plugging- Airway clearance therapy with:     - Albuterol nebs TID, followed by     - Saline nebs TID, followed by     - Chest PT     - Suctioning Prn- Increase PEEP to 12, increase flow to 90L-Check ABG following vent changes-Add Vanc/ Zosyn-Palliative care c/s followingDifficult/Prolonged Ventilator Wean- Resting Vent Settings: VAC 12/500/40/12- Trial Settings: PSV- No urgency to try to wean as he will never safely be able to not have a vent at the bedside even when on TM- Ensure electrolytes replete (phos, mag, calcium)- Ensure adequate nutrition- PT and OT if ableCode Status: No Code/DNR/No ACLS Please refer to the [facility] Liberation from Mechanical Ventilation in the setting of Chronic Respiratory Failure guidelines for more details.--------------------------------------------------Arthur Niemann, MD Attending addendumI saw and evaluated the patient, Arthur [name]. I agree with the findings and the plan of care as documented in Dr Greig Right note. 55 year old male with PMH of DM, tobacco and alcohol use, waldenstroms macroglobulinemia, and laryngeal squamous cell carcinoma (diagnosed  in October of 2021, s/p cisplatin and radiation therapy and trach and PEG transferred from OSH for SOB and necrotic mass around his trach site. He was admitted to the SICU had VAP and episodes of trach occlusion from mucus plugging. Now on 10-7 and on vent AC volume with some dyssynchrony On  [place] has large necrotic transposition on mass with invasion into the thyroid gland, trachea, hypopharynx, thyroid cartilage and cervical esophagus with extensive cervical mediastinal and supraclavicular lymphadenopathy and airway narrowing with tumor growth below the trach. He is approaching a palliative stage.For his present picture can increase the PEEP and increase the flow that will help with ventilation.  Arthur Benton, MD, PhD

## 2020-11-18 NOTE — Other
[facility] [facility]    [facility]     Palliative Care Follow-Up Note    Present History   Chart reviewed and case discussed with primary team.  Arthur [name] was seen in follow up this afternoon, patient's niece Arthur [name] at bedside, attending Dr. Westley Hummer also present for part of the visit.  Arthur [name] reports just arriving to the bedside shortly before this visit, has not noted pain, but admits some grimacing/crying facial expressions at times.  He has also been experiencing increased amounts of mucous and secretions.  Per the nurse the patient mentioned to the overnight nurse that he was afraid/anxious to die.  When asked if having pain able to shake head no.  Arthur [name] brother downstairs hoping to come up and visit with [name] however he is unvaccinated and had completed an antigen test this morning, not a PCR.  Discussed this with PCM on floor who states there is no exception at this time for this patient.  The only exception would be if the patient was actively dying/terminally extubated.  This was explained to Arthur [name] who verbalized understanding.      Review of Allergies/Hx/Meds   Allergies  Patient has No Known Allergies.    Medical History  Past Medical History:   Diagnosis Date   ? Diabetes mellitus (HC Code) (HC CODE)    ? Hypercholesteremia    ? Hypertension        Family History  Family History   Problem Relation Age of Onset   ? Aneurysm Mother        Social History  Social History     Socioeconomic History   ? Marital status: Legally Separated     Spouse name: Not on file   ? Number of children: Not on file   ? Years of education: Not on file   ? Highest education level: Not on file   Occupational History   ? Not on file   Tobacco Use   ? Smoking status: Former Smoker     Packs/day: 1.00     Years: 30.00     Pack years: 30.00     Quit date: 03/04/2013     Years since quitting: 7.7   Substance and Sexual Activity   ? Alcohol use: Yes     Alcohol/week: 14.0 standard drinks     Types: 14 Cans of beer per week   ? Drug use: Not on file   ? Sexual activity: Not on file   Other Topics Concern   ? Not on file   Social History Narrative   ? Not on file     Social Determinants of Health     Financial Resource Strain:    ? Difficulty of Paying Living Expenses:    Food Insecurity:    ? Worried About Programme researcher, broadcasting/film/video in the Last Year:    ? Barista in the Last Year:    Transportation Needs:    ? Freight forwarder (Medical):    ? Lack of Transportation (Non-Medical):    Physical Activity:    ? Days of Exercise per Week:    ? Minutes of Exercise per Session:    Stress:    ? Feeling of Stress :    Social Connections:    ? Frequency of Communication with Friends and Family:    ? Frequency of Social Gatherings with Friends and Family:    ? Attends Religious Services:    ? Active  Member of Clubs or Organizations:    ? Attends Engineer, structural:    ? Marital Status:    Intimate Partner Violence:    ? Fear of Current or Ex-Partner:    ? Emotionally Abused:    ? Physically Abused:    ? Sexually Abused:        InPatient Medications  Current Facility-Administered Medications   Medication Dose Route Frequency Last Rate   ? acetaminophen  650 mg Per G Tube Q6H PRN     ? albuterol  2.5 mg Nebulization Q6H PRN     ? bisacodyL  10 mg Rectal DAILY PRN     ? carboxymethylcellulose  1 drop Both Eyes TID PRN     ? clonazePAM  0.5 mg Per J Tube BID     ? famotidine  20 mg Per J Tube Daily     ? glucagon  1 mg Intramuscular Once PRN     ? glycopyrrolate  0.2 mg IV Push TID PRN     ? heparin PF in 0.9 % sodium chloride  5 mL Intra-Catheter PRN for Line Care     ? HYDROmorphone  1 mg IV Push Q3H PRN     ? insulin regular human   Subcutaneous Q6H     ? labetaloL         ? LORazepam  1 mg IV Push Q4H PRN     ? midazolam (PF)         ? polyethylene glycol  17 g Per G Tube BID     ? senna  2 tablet Per G Tube BID     ? sodium chloride  10 mL Intra-Catheter PRN for Line Care     ? sodium chloride  20 mL Intra-Catheter PRN for Line Care         [email or web address]    Palliative Performance Scale 30%   (Modification of Karnofsky and was designed for measurement of physical status in Palliative Care. Only 10% of patients with PPS score <=50% would be expected to survive >6 months)    Physical Exam  Vitals and nursing note reviewed.   Constitutional:       General: He is in acute distress.      Appearance: He is ill-appearing.      Comments: Briefly alert, then closes eyes   Pulmonary:      Effort: Tachypnea and accessory muscle usage present.      Comments: Trached to vent  Neurological:      Mental Status: He is alert.      Comments: Rouses to touch, becoming tearful, anxious appearing; shakes head no to inquiry about pain         Vitals:  Temp:  [99.2 ?F (37.3 ?C)-100.6 ?F (38.1 ?C)] 99.2 ?F (37.3 ?C)  Pulse:  [97-119] 113  Resp:  [19-51] 19  BP: (113-133)/(51-79) 133/79  SpO2:  [93 %-100 %] 99 %  ECOG Performance Status (non-calculated): 3  calc. ECOG (from Karnofsky): 2  Wt Readings from Last 3 Encounters:   11/13/20 (!) 147.9 kg   10/06/20 (!) 139.5 kg   09/29/20 (!) 142.4 kg       Review of Labs/Diagnostics  Complete Blood Count:  Recent Labs   Lab 11/15/20  1825 11/16/20  0507 11/17/20  0515   WBC 10.7 10.3 11.6*   HGB 8.5* 8.5* 7.0*   HCT 27.90* 28.60* 23.20*   PLT 203 211 209     Comprehensive Metabolic Panel:  Recent Labs   Lab 11/15/20  1825 11/15/20  2339 11/16/20  0507 11/16/20  1347 11/17/20  0515 11/17/20  0523 11/18/20  0609   NA 147*  --  146*  --  148*  --   --    K 4.4  --  4.8  --  4.4  --   --    CL 109*  --  109*  --  111*  --   --    CO2 28  --  28  --  28  --   --    BUN 68*  --  67*  --  70*  --   --    CREATININE 1.85*  --  1.85*  --  2.12*  --   --    GLU 151*   < > 226*   < > 237*   < > 277*   CALCIUM 10.6*  --  10.6*  --  10.1  --   --     < > = values in this interval not displayed.     Recent Labs   Lab 11/15/20  1825   ALKPHOS 193*   BILITOT <0.2 BILIDIR <0.2   ALT 49   AST 22       Diagnostics:  No results found.      Impression/Plan:   Arthur [name] is a 55 y.o. 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 Admitted to SICU on 2/9 for ventilator management following RRT on oncology floor for desaturation and increased work of breathing while on trach mask, ultimately placed back on vent. Referred for Palliative Care Consult by Attending Provider: Alric Quan, MD [phone] for supportive care.    End-of-life care:  - Would escalate orders as needed for more aggressive symptom management to optimize comfort at end of life  - Recommend scheduling Dilaudid 1 mg IV q4hr ATC for pain and respiratory distress.   - Recommend scheduling Ativan 1 mg q 8 hours ATC for agitation/restlessness/respiratory distress.   - Goal is for more aggressive management of agitation so that wrist restraints can be removed.  - Can consider Robinul 0.2 mg IV q6hr PRN for terminal secretions  - For constipation: Senna BID, Miralax BID for constipation; Dulcolax suppository daily PRN    Coping/Support:  - Will continue to provide support to patient and family, with ongoing assessments for psychosocial/spiritual needs  - Virtual hospice referral pending for transition to in-patient hospice. In addition to hospice-trained clinicians who can provide further optimization of comfort-based care for patient at end of life, electing the hospice benefit will provide the family with year-long bereavement support.   - Family at risk for complex grief given the simultaneous transition to hospice of Marjorie's son and brother  - Family endorse financial stressors/ concern for losing access to some utilities in [place] name. Social Work aware and following.     Goals of Care:  Transitioned to CMO yesterday. Will continue on current vent settings until arrangements can be made for family to visit to say their goodbyes and hospice can be instituted, after which plan is to liberate from the vent.    Thank you for involving the Palliative Care team in the care of this patient. The Palliative Care team will continue to follow.    Patient was evaluated: in person  Spent 30 minutes with patient and family 25 minutes reviewing chart and discussing plan with team.     Signed:  Erasmo Leventhal, DNP, APRN, [facility]  11/18/2020  3:49 PM  Palliative Care Service  Mobile Heartbeat #: [phone]

## 2020-11-18 NOTE — Plan of Care
Inpatient Physical Therapy Progress Note     IP Adult PT Eval/Treat - 11/18/20 1235          Treatment Deferred    Treatment Deferred Other (Comment)     Deferred Treatment Comments Physical therapy consult was cancelled. Patient was transitioned to CMO.        Frequency/Equipment Recommendations    PT Frequency --   consult cancelled, 11/18/2020, CMO    What day of week is next treatment expected? --   .                       Charlestine Massed, PT [facility] [phone]

## 2020-11-18 NOTE — Plan of Care
Plan of Care Overview/ Patient Status    0700-1900 Pt alert, mouthing needs. C/o pain, PRN dilaudid given thorughout shift with positive effect. Pt stating he is anxious multiple times throughout shift, PRN ativan given with positive effect. No s/s acute cardiac/resp distress, copious secretions, robinal given as needed. Trach care completed. Wound care completed. Soft wrist restraints maintained, due to pulling at trach lines. Foley maintained, care provided. T/P q2h. Safety precautions maintained. Bed alarm active/audible. See flowsheet for details.

## 2020-11-18 NOTE — Plan of Care
Plan of Care Overview/ Patient Status    1900-0700Pt opens eyes to voice, does not track, but withdraws from pain. Bilateral wrist restraints in place due to pulling on vent.On tele, NSR. On resting settings overnight. Dilaudid given x2 prn for comfort. Pt placed on cmo status. Q2h T&P

## 2020-11-18 NOTE — Progress Notes
[facility] Progress NoteAttending Provider: Pryor Ochoa, MD Subjective                                                                              Subjective: Interim History: Patient today appeared more anxious. He did state to the nurse about being scared to die. Niece [name] at bedside today visiting. Patient was alert with eyes open intermittently. He would grimace/appear to be crying. Denied any pain, but admitted to being scared.  Palliative care provider at bedside. Review of Allergies/Meds/Hx: Review of Allergies/Meds/Hx:I have reviewed the patient's: allergies, current scheduled medications, current infusions, current prn medications and past medical history Objective Objective: Vitals:I have reviewed the patient's current vital signs as documented in the patient's EMR.   and Last 24 hours: Temp:  [99.2 ?F (37.3 ?C)-100.3 ?F (37.9 ?C)] 99.2 ?F (37.3 ?C)Pulse:  [97-119] 104Resp:  [19-39] 23BP: (113-133)/(51-79) 133/79SpO2:  [93 %-100 %] 97 %I/O's:I have reviewed the patient's current I&O's as documented in the EMR.Gross Totals (Last 24 hours) at 11/18/2020 1441Last data filed at 11/18/2020 1100Intake 2225.22 ml Output 2225 ml Net 0.22 ml Procedures:None Physical Exam Physical ExamConstitutional:     Appearance: He is ill-appearing. Intermittently sleeping Neck:    Comments: Large neck mass with peristomal necrosis and apparent extension distally with pink nodule nearly at sternal notch. Increased dark secretions oozing over onto to the chest. Cardiovascular:    Rate and Rhythm: Normal rate and regular rhythm.    Heart sounds: Normal heart sounds. Pulmonary:    Comments: Coarse vented breath sounds anteriorly. + Abdomino-thoracic breathing Abdominal:    General: Bowel sounds are normal. There is no distension.    Palpations: Abdomen is soft.    Tenderness: There is no abdominal tenderness.    Comments: g-tube without surrounding erythema or edema Musculoskeletal:    Comments: Edematous extremities Skin:   General: Skin is warm and dry. Neurological:    Mental Status: He is alert.    Comments: Makes eye contact.  Otherwise unable to follow simple instructions.Unable to assess orientation Psychiatric:    Comments: anxiousLabs:I have reviewed the patient's labs within the last 24 hrs.Last 24 hours: Recent Results (from the past 24 hour(s)) POC Glucose (Fingerstick)  Collection Time: 11/17/20  6:03 PM Result Value Ref Range  Glucose, Meter 218 (H) 70 - 100 mg/dL POC Glucose (Fingerstick)  Collection Time: 11/18/20 12:42 AM Result Value Ref Range  Glucose, Meter 238 (H) 70 - 100 mg/dL POC Glucose (Fingerstick)  Collection Time: 11/18/20  5:15 AM Result Value Ref Range  Glucose, Meter 263 (H) 70 - 100 mg/dL POC Glucose (Fingerstick)  Collection Time: 11/18/20  6:09 AM Result Value Ref Range  Glucose, Meter 277 (H) 70 - 100 mg/dL POC Glucose (Fingerstick)  Collection Time: 11/18/20  1:25 PM Result Value Ref Range  Glucose, Meter 295 (H) 70 - 100 mg/dL Diagnostics:No new radiology.ECG/Tele Events: No ECG ordered today Assessment Assessment: Assessment:Arthur [name] is a 55 y.o. 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 He was found with progression of malignancy with encroachment  of tumor on his trachea. There was concern for obstruction at [facility] so he was transferred to ENT at [facility] for trach management. Scope notable for partial distal obstruction. On day 5 of admission he had respiratory distress requiring ventilator and SICU transfer. SICU course complicated by incidental R IJ thrombus at port, MRSA VAP, urinary retention, and multiple RRTs for desat 2/2 trach occlusion/obstruction and mucous plugging. Transferred to pulm stepdown for vent weaning 11/13/2020. After GOC discussions on 11/17/2020 and due to decline of his clinical status patient now CMO. Awaiting inpatient hospice at this time. Principal Problem:  Laryngeal cancer (HC Code) (HC CODE)  SNOMED [place](R): MALIGNANT TUMOR OF LARYNX  Active Problems:  Chronic ITP (idiopathic thrombocytopenia) (HC Code) (HC CODE)  SNOMED [place](R): CHRONIC IDIOPATHIC THROMBOCYTOPENIC PURPURA    Newly diagnosed venous thromboembolism (VTE)  SNOMED [place](R): THROMBOEMBOLISM OF VEIN    Difficult airway  SNOMED [place](R): EXPECTED DIFFICULT TRACHEAL INTUBATION    Acute hypoxemic respiratory failure (HC Code) (HC CODE)  SNOMED [place](R): ACUTE HYPOXEMIC RESPIRATORY FAILURE   Plan Plan: Acute on Chronic hypoxemic respiratory FailureAirway obstruction from laryngeal SCCTumor invasion of stoma with partial distal obstruction Mucous plugging 2/2 tumor necrosisPERTINENT TRACH INFORMATIONService who placed trach: ENTDate of placement:   PTA Date of last routine trach change:  Airway 11/11/20 1100 tracheostomy-Date Tracheostomy Changed: 11/11/20 Type of trach:    #8 hyperflex bivona to bypass obstruction. Eventual plan is custom bivona# CMO- continue suctioning prn - Vent settings to be maintained with PEEP of 12, Flow to 90 per pulmonary to assist in patient overcoming the obstruction of the tumor and now to maintain comfort. - discontinued on antibiotics- discontinued PSV trials at this time- while on tube feeds, until family able visit patient and give farewells, will continue high dose LISS - ativan 1mg  TID scheduled for anxiety, continuing klonapin 0.5mg  BID via J tube- dilaudid 1mg  q4h scheduled for pain control and respiratory distress, along with prn dilaudid 1mg  q3h - miralax and senna BID scheduled - tylenol, albuterol nebs and ativan prn  - plan is for possible discontinuation of vent and tube feeds by Thursday-> per family's request - 24 hour visitation. 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 - S/p palliative radiation x 6, decadron 2/22-2/24- ENT -> tumor board -> no curative or meaningful surgical options at this time.  - palliative care on board-> GOC discussions with sister on 3/16 resulted in transitioning patient to CMO/inpatient hospice. ??Anxiety / Depression- Clonazepam 0.5mg  BID- Ativan 1mg  IV Q4h PRN for agitation and respiratory distress - Appreciate palliative care and psych consults\- started on IV ativan 1mg  TID scheduled??Diet: TF no tray (nepro for hyperkalemia)Code Status: No Code/DNR/No ACLS/CMO ?Electronically Signed:Honest Vanleer Westley Hummer, MD 11/18/2020,

## 2020-11-19 LAB — LOWER RESP CULTURE, QUAL: BKR LOWER RESPIRATORY CULTURE (QUAL): NORMAL

## 2020-11-19 MED ORDER — LORAZEPAM 2 MG/ML INJECTION SOLUTION
2 mg/mL | Freq: Three times a day (TID) | INTRAVENOUS | Status: DC
Start: 2020-11-19 — End: 2020-11-20
  Administered 2020-11-19 – 2020-11-20 (×4): 2 mL via INTRAVENOUS

## 2020-11-19 MED ORDER — CODEINE 10 MG-GUAIFENESIN 100 MG/5 ML ORAL LIQUID
10-1005 mg/5 mL | Freq: Four times a day (QID) | GASTROSTOMY | Status: DC
Start: 2020-11-19 — End: 2020-11-20
  Administered 2020-11-19 – 2020-11-20 (×6): 10-100 mL via GASTROSTOMY

## 2020-11-19 NOTE — Plan of Care
[facility]		Location: EP [id number] y.o., male			Attending: Pryor Ochoa, MD	Admit Date: 10/09/2020		[id number] LOS: 41 days FOLLOW UP NUTRITION ASSESSMENT DIET ORDER: Tube Feed With Tray --> Nepro @ 65 mL/hrANTHROPOMETRICS:Height: 74Admit wt: 136.2 kg, BMI: 38.6 kg/m^2Current wt: 147.9 kg, BMI: 41.86 kg/m^2Dosing wt (IBW): 95 kg (hamwi +10)Additional wt information:Ht confirmed with pt: yes. Based on EMR wt hx, pt down 8.4 kg (5.8%) over the past three months, not considered significant for time frame. Fluctuations in wt may be r/t fluid status vs scale variance. Overall upward trend during admission. Per flowsheets, 2-3+ edema present.Wt hx with the lowest wt from each available month over the past year: 09/22/20: 137.8 kg12/01/21: 143.2 kgESTIMATED NUTRITION REQUIREMENTS:Kcal/day: 2850	(30 kcal/kg)Protein/day: 162	(1.7 gm/kg) Fluids/day: Fluid management per medical team discretion. ~2580 mL (30 mL/kcal) Needs based on: dosing wt (95 kg), ICU, obesity, CA w/mets, AKI/CKD, PNA, s/p shockNUTRITION ASSESSMENT: Chart reviewed for follow up. Pt seen trached to vent. Pt CMO, but TF continues until family can come to say goodbye. Pt has received 80% of goal enteral volume since last assessment. RN notes indicate pt tolerated TF at goal since last assessment, suspect pt received more TF than documented. [employer] order d/c'd per team on 3/15. Na 148, BUN 70, and Cr 2.12 on 3/15. BG ranged 256-295 in the last 24 hrs-- insulin regimen per team. Discussed different TF options with with covering provider PA Anekondadha Revakala. PA spoke to daughter who said she would like to continue TF as ordered until rest of family can visit to say goodbye. Will continue TF as ordered.Cultural/Religious/Ethnic Needs: NoFood Allergies: None per pt NUTRITION DIAGNOSIS:Inadequate oral intake related to medical status as evidenced by NPO. --- continues INTERVENTIONS/RECOMMENDATIONS: Meals and Snacks:- Diet advancement per team/SLP --> currently NPOGoal: Patient will have diet advanced or remain on nutrition support prior to next nutrition assessment. --- met, continuesEnteral Nutrition:- While TF remains in POC, continue Nepro @ 65 mL/hr x 24 hrs - Total provides: 2808 kcal, 126 gm protein, 251 gm CHO (10.5 gm/hr), and 1134 mL free H2O- Additional free water boluses per team Goal: Patient will receive >90% of goal volume TF prior to next nutrition assessment. --- met, continuesDischarge Planning and Transfer of Nutrition Care:  Nutrition related discharge needs still being determined at this time, will continue to follow.MONITORING / EVALUATION:Food/Nutrition-Related History:Food and Nutrient IntakeEnteral Nutrition IntakeAnthropometric Measurements:Weight Biochemical Data, Medical Tests and Procedures:Electrolyte and renal profileGlucose/endocrine profileNutrition-Focus Physical FindingsOverall findingsElectronically signed by Molli Knock, RD on March 17, 2022[facility] #[phone] note: Nutrition is a consult only service on weekends and holidays.  Please enter a consult in EPIC if assistance is needed on a weekend or holiday or via [facility] [phone] to contact the covering RD.

## 2020-11-19 NOTE — Plan of Care
Plan of Care Overview/ Patient Status    Problem: Adult Inpatient Plan of CareGoal: Readiness for Transition of CareOutcome: Interventions implemented as appropriateChart review: Bilateral wrist restraints in place due to pulling on vent.On resting settings overnight. D/C planning: Derrek Gu following. Restraints are barriers. CM team will continue to follow. Dian Queen, MSWCare Management Main CM office [phone]

## 2020-11-19 NOTE — Plan of Care
Plan of Care Overview/ Patient Status    1900-0700Pt opens eyes to voice, does not track, but withdraws from pain. Bilateral wrist restraints in place due to pulling on vent.On tele, NSR. On resting settings overnight. Dilaudid given for comfort. Pt on cmo status. Q2h T&P

## 2020-11-19 NOTE — Progress Notes
[facility] Medicine Progress NoteAttending Provider: Pryor Ochoa, MD Subjective                                                                              Subjective: Interim History: Patient appeared to be breathing more comfortably. He was more arousable today, however would awaken and start to cry. Denied pain, but noded yes when asked if he was sad and scared. Review of Allergies/Meds/Hx: Review of Allergies/Meds/Hx:I have reviewed the patient's: allergies, current scheduled medications, current infusions, current prn medications and past medical history Objective Objective: Vitals:I have reviewed the patient's current vital signs as documented in the patient's EMR.   and Last 24 hours: Temp:  [98.2 ?F (36.8 ?C)] 98.2 ?F (36.8 ?C)Pulse:  [94-109] 102Resp:  [16-32] 20BP: (147)/(92) 147/92SpO2:  [99 %-100 %] 99 %I/O's:I have reviewed the patient's current I&O's as documented in the EMR.Gross Totals (Last 24 hours) at 11/19/2020 1422Last data filed at 11/19/2020 0922Intake 850 ml Output 2625 ml Net -1775 ml Procedures:None Physical Exam Physical ExamConstitutional:     Appearance: He is ill-appearing. Intermittently sleeping Neck:    Comments: Large neck mass with peristomal necrosis and apparent extension distally with pink nodule nearly at sternal notch. Increased dark secretions oozing over onto to the chest. Cardiovascular:    Rate and Rhythm: Normal rate and regular rhythm.    Heart sounds: Normal heart sounds. Pulmonary:    Comments: Coarse vented breath sounds anteriorly. + Abdomino-thoracic breathing Abdominal:    General: Bowel sounds are normal. There is no distension.    Palpations: Abdomen is soft.    Tenderness: There is no abdominal tenderness.    Comments: g-tube without surrounding erythema or edema Musculoskeletal:    Comments: Edematous extremities Skin: General: Skin is warm and dry. Neurological:    Mental Status: He is alert.    Comments: Makes eye contact.  Otherwise unable to follow simple instructions.Unable to assess orientation Psychiatric:    Comments: anxiousLabs:I have reviewed the patient's labs within the last 24 hrs.Last 24 hours: Recent Results (from the past 24 hour(s)) POCT Glucose  Collection Time: 11/18/20  6:19 PM Result Value Ref Range  Glucose, Meter 281 (H) 70 - 100 mg/dL POCT Glucose  Collection Time: 11/19/20 12:46 AM Result Value Ref Range  Glucose, Meter 256 (H) 70 - 100 mg/dL POCT Glucose  Collection Time: 11/19/20  5:15 AM Result Value Ref Range  Glucose, Meter 287 (H) 70 - 100 mg/dL POCT Glucose  Collection Time: 11/19/20 12:22 PM Result Value Ref Range  Glucose, Meter 311 (H) 70 - 100 mg/dL Diagnostics:No new radiology.ECG/Tele Events: No ECG ordered today Assessment Assessment: Assessment:Arthur [name] is a 55 y.o. 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 He was found with progression of malignancy with encroachment of tumor on his trachea. There was concern for obstruction at [facility] so he was transferred to ENT at [facility] for trach management. Scope notable for partial distal obstruction. On day 5 of admission he had respiratory distress requiring ventilator and SICU transfer. SICU course complicated by incidental R IJ  thrombus at port, MRSA VAP, urinary retention, and multiple RRTs for desat 2/2 trach occlusion/obstruction and mucous plugging. Transferred to pulm stepdown for vent weaning 11/13/2020. After GOC discussions on 11/17/2020 and due to decline of his clinical status patient now CMO. Awaiting inpatient hospice at this time. Principal Problem:  Laryngeal cancer (HC Code) (HC CODE)  SNOMED [place](R): MALIGNANT TUMOR OF LARYNX  Active Problems:  Chronic ITP (idiopathic thrombocytopenia) (HC Code) (HC CODE)  SNOMED [place](R): CHRONIC IDIOPATHIC THROMBOCYTOPENIC PURPURA    Newly diagnosed venous thromboembolism (VTE)  SNOMED [place](R): THROMBOEMBOLISM OF VEIN    Difficult airway  SNOMED [place](R): EXPECTED DIFFICULT TRACHEAL INTUBATION    Acute hypoxemic respiratory failure (HC Code) (HC CODE)  SNOMED [place](R): ACUTE HYPOXEMIC RESPIRATORY FAILURE   Plan Plan: Acute on Chronic hypoxemic respiratory FailureAirway obstruction from laryngeal SCCTumor invasion of stoma with partial distal obstruction Mucous plugging 2/2 tumor necrosisPERTINENT TRACH INFORMATIONService who placed trach: ENTDate of placement:   PTA Date of last routine trach change:  Airway 11/11/20 1100 tracheostomy-Date Tracheostomy Changed: 11/11/20 Type of trach:    #8 hyperflex bivona to bypass obstruction. Eventual plan is custom bivona# CMO- continue suctioning prn - Vent settings to be maintained with PEEP of 12, Flow to 90 per pulmonary to assist in patient overcoming the obstruction of the tumor and now to maintain comfort. - discontinued on antibiotics- discontinued PSV trials at this time- while on tube feeds, until family able visit patient and give farewells, will continue high dose LISS - ativan 2mg  TID scheduled for anxiety, continuing klonapin 0.5mg  BID via J tube- dilaudid 1mg  q4h scheduled for pain control and respiratory distress, along with prn dilaudid 1mg  q3h - miralax and senna BID scheduled - tylenol, albuterol nebs and ativan prn  - plan is for possible discontinuation of vent and tube feeds by Thursday-> per family's request - 24 hour visitation. - attempting to provide patient comfort to be able to remove the restraints. - started on robitussin scheduled to decrease his bronchospasms and coughing. 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 - S/p palliative radiation x 6, decadron 2/22-2/24- ENT -> tumor board -> no curative or meaningful surgical options at this time.  - palliative care on board-> GOC discussions with sister on 3/16 resulted in transitioning patient to CMO/inpatient hospice. ??Anxiety / Depression- Clonazepam 0.5mg  BID- Ativan 1mg  IV Q4h PRN for agitation and respiratory distress - Appreciate palliative care and psych consults\-  Increased IV ativan to 2mg  TID scheduled??Diet: TF no tray (nepro for hyperkalemia)Code Status: No Code/DNR/No ACLS/CMO ?Electronically Signed:Markes Shatswell Westley Hummer, MD 11/19/2020,

## 2020-11-19 NOTE — Plan of Care
Plan of Care Overview/ Patient Status    Pt waxing/waning, nonverbal, does not follow commands. Medicated with Dilaudid and Ativan for pain/anxiety/restlessness with good effect. B/L soft wrist restraints in place to prevent pulling at lines or self decannulation, pt able to move all extremities. Pt on A/C settings, suctioned q1-2h. Foley in place, no BM today. Q2h T/R in bed. Heels in offloading boots. Family at bedside to visit this afternoon. CMO status.

## 2020-11-20 MED ORDER — LORAZEPAM 2 MG/ML INJECTION SOLUTION
2 mg/mL | Freq: Four times a day (QID) | INTRAVENOUS | Status: DC
Start: 2020-11-20 — End: 2020-11-21
  Administered 2020-11-21 (×3): 2 mL via INTRAVENOUS

## 2020-11-20 MED ORDER — HYDROMORPHONE 2 MG/ML INJECTION SOLUTION
2 mg/mL | INTRAVENOUS | Status: DC
Start: 2020-11-20 — End: 2020-11-21
  Administered 2020-11-20 – 2020-11-21 (×7): 2 mL via INTRAVENOUS

## 2020-11-20 MED ORDER — CODEINE 10 MG-GUAIFENESIN 100 MG/5 ML ORAL LIQUID
10-100 mg/5 mL | GASTROSTOMY | Status: DC
Start: 2020-11-20 — End: 2020-11-23
  Administered 2020-11-20 – 2020-11-22 (×12): 10-100 mL via GASTROSTOMY

## 2020-11-20 MED ORDER — ACETAMINOPHEN 650 MG/20.3 ML ORAL SUSPENSION
650 mg/20.3 mL | GASTROSTOMY | Status: DC | PRN
Start: 2020-11-20 — End: 2020-11-23
  Administered 2020-11-20 – 2020-11-21 (×2): 650 mL via GASTROSTOMY

## 2020-11-20 NOTE — Plan of Care
Plan of Care Overview/ Patient Status    1900-0700Pt opens eyes to voice, does not track, but withdraws from pain. Bilateral wrist restraints in place due to pulling on vent.On tele, NSR. On resting settings overnight. Dilaudid given for comfort. Pt on cmo status.  Pt sister [name], called concerned about family member being disconnected from vent. She Stated  I was not told that my brother's vent would be turned off. I I haven't had a chance to say goodbye due to my schedule.  Nurse assured family member that day team would be notified. Pt sister would like to be contacted prior to any changes are made to the vent. Q2h T&P, pt comfort promoted.

## 2020-11-20 NOTE — Progress Notes
[facility] Medicine Progress NoteAttending Provider: Pryor Ochoa, MD Subjective                                                                              Subjective: Interim History: Patient appeared to be breathing more comfortably. He wakes up to his name, then closes his eyes. Per nurse, he is requiring a few intermittent dosing of the ativan and dilaudid to help with his anxiety and respiratory distress. Called [name] and [name] today regarding post-poning the disconnecting from the ventilator. There was a confusion regarding todays plan and to allow [name] and [name] to plan accordingly, we will readdress it tomorrow. Provided reassurances and emotional support. Requested that whoever would like to visit should do so.  Review of Allergies/Meds/Hx: Review of Allergies/Meds/Hx:I have reviewed the patient's: allergies, current scheduled medications, current infusions, current prn medications and past medical history Objective Objective: Vitals:I have reviewed the patient's current vital signs as documented in the patient's EMR.   and Last 24 hours: Temp:  [101 ?F (38.3 ?C)-101.4 ?F (38.6 ?C)] 101 ?F (38.3 ?C)Pulse:  [100-117] 117Resp:  [19-27] 23BP: (114)/(60) 114/60SpO2:  [96 %-98 %] 96 %I/O's:I have reviewed the patient's current I&O's as documented in the EMR.Gross Totals (Last 24 hours) at 11/20/2020 1725Last data filed at 11/20/2020 1500Intake 1465 ml Output 850 ml Net 615 ml Procedures:None Physical Exam Physical ExamConstitutional:     Appearance: He is ill-appearing. Intermittently sleeping Neck:    Comments: Large neck mass with peristomal necrosis and apparent extension distally with pink nodule nearly at sternal notch. Increased dark secretions oozing over onto to the chest. Cardiovascular:    Rate and Rhythm: Normal rate and regular rhythm.    Heart sounds: Normal heart sounds. Pulmonary:    Comments: Coarse vented breath sounds anteriorly. Decreased  Abdomino-thoracic breathing Abdominal:    General: Bowel sounds are normal. There is no distension.    Palpations: Abdomen is soft.    Tenderness: There is no abdominal tenderness.    Comments: g-tube without surrounding erythema or edema Musculoskeletal:    Comments: Edematous extremities Skin:   General: Skin is warm and dry. Neurological:    Mental Status: He issleeping, opens eyes to his name but then closes it off.    Comments:  unable to follow simple instructions.Unable to assess orientation Psychiatric:    Comments: anxious at times Labs:I have reviewed the patient's labs within the last 24 hrs.Last 24 hours: Recent Results (from the past 24 hour(s)) POCT Glucose  Collection Time: 11/19/20  6:04 PM Result Value Ref Range  Glucose, Meter 308 (H) 70 - 100 mg/dL POCT Glucose  Collection Time: 11/19/20  6:08 PM Result Value Ref Range  Glucose, Meter 318 (H) 70 - 100 mg/dL POC Glucose (Fingerstick)  Collection Time: 11/19/20 11:08 PM Result Value Ref Range  Glucose, Meter 269 (H) 70 - 100 mg/dL POCT Glucose  Collection Time: 11/20/20  4:55 AM Result Value Ref Range  Glucose, Meter 382 (H) 70 - 100 mg/dL POCT Glucose  Collection Time: 11/20/20  6:08 AM Result Value Ref Range  Glucose, Meter 392 (H) 70 - 100 mg/dL POCT Glucose  Collection Time: 11/20/20 12:17 PM Result Value Ref Range  Glucose, Meter 345 (H) 70 - 100 mg/dL POC Glucose (Fingerstick)  Collection Time: 11/20/20  5:24 PM Result Value Ref Range  Glucose, Meter 367 (H) 70 - 100 mg/dL Diagnostics:No new radiology.ECG/Tele Events: No ECG ordered today Assessment Assessment: Assessment:Arthur [name] is a 55 y.o. 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 He was found with progression of malignancy with encroachment of tumor on his trachea. There was concern for obstruction at [facility] so he was transferred to ENT at [facility] for trach management. Scope notable for partial distal obstruction. On day 5 of admission he had respiratory distress requiring ventilator and SICU transfer. SICU course complicated by incidental R IJ thrombus at port, MRSA VAP, urinary retention, and multiple RRTs for desat 2/2 trach occlusion/obstruction and mucous plugging. Transferred to pulm stepdown for vent weaning 11/13/2020. After GOC discussions on 11/17/2020 and due to decline of his clinical status patient now CMO. Awaiting inpatient hospice at this time. Principal Problem:  Laryngeal cancer (HC Code) (HC CODE)  SNOMED [place](R): MALIGNANT TUMOR OF LARYNX  Active Problems:  Chronic ITP (idiopathic thrombocytopenia) (HC Code) (HC CODE)  SNOMED [place](R): CHRONIC IDIOPATHIC THROMBOCYTOPENIC PURPURA    Newly diagnosed venous thromboembolism (VTE)  SNOMED [place](R): THROMBOEMBOLISM OF VEIN    Difficult airway  SNOMED [place](R): EXPECTED DIFFICULT TRACHEAL INTUBATION    Acute hypoxemic respiratory failure (HC Code) (HC CODE)  SNOMED [place](R): ACUTE HYPOXEMIC RESPIRATORY FAILURE   Plan Plan: Acute on Chronic hypoxemic respiratory FailureAirway obstruction from laryngeal SCCTumor invasion of stoma with partial distal obstruction Mucous plugging 2/2 tumor necrosisPERTINENT TRACH INFORMATIONService who placed trach: ENTDate of placement:   PTA Date of last routine trach change:  Airway 11/11/20 1100 tracheostomy-Date Tracheostomy Changed: 11/11/20 Type of trach:    #8 hyperflex bivona to bypass obstruction. Eventual plan is custom bivona# CMO- continue suctioning prn - Vent settings to be maintained with PEEP of 12, Flow to 90 per pulmonary to assist in patient overcoming the obstruction of the tumor and now to maintain comfort. - discontinued on antibiotics- discontinued PSV trials at this time- while on tube feeds, until family able visit patient and give farewells, will continue high dose LISS - increased ativan 2mg  to Q6h scheduled for anxiety, continuing klonapin 0.5mg  BID via J tube- dilaudid 1mg  q3h scheduled for pain control and respiratory distress, along with prn dilaudid 1mg  q3h - miralax and senna BID scheduled - tylenol, albuterol nebs and ativan prn  - plan is for possible discontinuation of vent and tube feeds in the next few days once family is able to come in. - 24 hour visitation. - attempting to provide patient comfort to be able to remove the restraints. - continue robitussin scheduled to decrease his bronchospasms and coughing. 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 - S/p palliative radiation x 6, decadron 2/22-2/24- ENT -> tumor board -> no curative or meaningful surgical options at this time.  - palliative care on board-> GOC discussions with sister on 3/16 resulted in transitioning patient to CMO/inpatient hospice. ??Anxiety / Depression- Clonazepam 0.5mg  BID- Ativan 1mg  IV Q4h PRN for agitation and respiratory distress - Appreciate palliative care and psych consults\-  Increased IV ativan to 2mg  q6h scheduled??Diet: TF no tray (nepro for hyperkalemia)Code Status: No Code/DNR/No ACLS/CMO ?Electronically Signed:Isaak Delmundo Westley Hummer, MD 11/20/2020,

## 2020-11-20 NOTE — Plan of Care
Plan of Care Overview/ Patient Status    Pt opens eyes to stimulation/voice, does not follow commands. Febrile, PRN tylenol administered with min effect, team notified. Medicated with Dilaudid and Ativan for pain/anxiety/restlessness with good effect. B/L soft wrist restraints in place to prevent pulling at lines or self decannulation, pt withdraws. Pt on A/C settings, suctioned q1-2h. Foley in place, no BM today. Q2h T/R in bed. Heels in offloading boots. Pt's sister [name] updated over the phone, providers contacted about Marjorie's concerns, plans to visit tomorrow with family. CMO status continued.

## 2020-11-21 MED ORDER — HYDROMORPHONE BOLUS VIA PCA PUMP
Status: DC | PRN
Start: 2020-11-21 — End: 2020-11-23

## 2020-11-21 MED ORDER — GLYCOPYRROLATE 0.2 MG/ML INJECTION SOLUTION
0.2 mg/mL | Freq: Three times a day (TID) | INTRAVENOUS | Status: DC
Start: 2020-11-21 — End: 2020-11-22
  Administered 2020-11-21 – 2020-11-22 (×3): 0.2 mL via INTRAVENOUS

## 2020-11-21 MED ORDER — SODIUM CHLORIDE 0.9 % INTRAVENOUS SOLUTION
INTRAVENOUS | Status: DC
Start: 2020-11-21 — End: 2020-11-23
  Administered 2020-11-21: 21:00:00 via INTRAVENOUS

## 2020-11-21 MED ORDER — LORAZEPAM 2 MG/ML INJECTION SOLUTION
2 mg/mL | INTRAVENOUS | Status: DC
Start: 2020-11-21 — End: 2020-11-22
  Administered 2020-11-21 – 2020-11-22 (×8): 2 mL via INTRAVENOUS

## 2020-11-21 MED ORDER — HYDROMORPHONE 2 MG/ML INJECTION SOLUTION
2 mg/mL | INTRAVENOUS | Status: DC | PRN
Start: 2020-11-21 — End: 2020-11-22
  Administered 2020-11-21: 18:00:00 2 mL via INTRAVENOUS

## 2020-11-21 MED ORDER — HYDROMORPHONE (DILAUDID) PCA 1 MG/ML (50 ML) YNH PYXIS
1 mg/ml | INTRAVENOUS | Status: DC
Start: 2020-11-21 — End: 2020-11-22
  Administered 2020-11-21: 18:00:00 1 mL/h via INTRAVENOUS

## 2020-11-21 MED ORDER — HYDROMORPHONE 2 MG/ML INJECTION SOLUTION
2 mg/mL | INTRAVENOUS | Status: DC | PRN
Start: 2020-11-21 — End: 2020-11-22
  Administered 2020-11-21: 19:00:00 2 mL via INTRAVENOUS

## 2020-11-21 NOTE — Plan of Care
Plan of Care Overview/ Patient Status    Pt obtunded, responds to pain/vigorous stimuli. Maintained on ac settings. Hydromorphone drip initiated. Family at the bed side. Bm today. Tube feed stopped. Bed alarmed. q4h suction Chaplin at the bed side. Port patent, safety and comfort maintained

## 2020-11-21 NOTE — Other
-    CONSULT  REQUEST  DOCUMENTATION  -  [facility] NOTE  -  Type of consult: [facility] Palliative Care   -  New Consult: [name] [name] /Location: 830/830-B / Arthur [name], Arthur [name] / Please confirm receipt of this message by texting back ?OK?  -  4 - If an URGENT Palliative consult is needed after hours, weekend or holidays please contact the On Call provider listed in AMION or Smartweb. Otherwise the patient will be seen normal business hours, on the next non-weekend, non-holiday day.  [name]  11/21/2020  10:59 AM  Consult [facility] [phone]

## 2020-11-21 NOTE — Plan of Care
Plan of Care Overview/ Patient Status    2300-0700Pt intermittently sleeping, alert but disoriented x3 otherwise. Appears comfortable, no obvious signs of pain or distress. Approx 0500, bedbath and chg cleaning performed, pt was mildly agitated while being cleaned. Prn ativan given prior to bath, pt was alert and appeared anxious. Soft wrist restraints maintained, assessed q2H. Tubefeeding maintained per MAR. Pt safety maintained and comfort measures provided. Trach care provided, peristomal suctioning required due to increased secretions around trach site.

## 2020-11-21 NOTE — Plan of Care
Plan of Care Overview/ Patient Status   1900-2330 Temp 101-101.6. Patient appears comfortable, opens eyes at tomes. Continue on vent ETS x 2 at this time for moderate amount off secretions. Tube feed as ordered. Catheter intact draining yellow urine. No BM at this time. Turned and positioned every 2 hours. Skin care as planned. Safety maintained.

## 2020-11-22 DIAGNOSIS — E7800 Pure hypercholesterolemia, unspecified: Secondary | ICD-10-CM

## 2020-11-22 DIAGNOSIS — K5903 Drug induced constipation: Secondary | ICD-10-CM

## 2020-11-22 DIAGNOSIS — T45515A Adverse effect of anticoagulants, initial encounter: Secondary | ICD-10-CM

## 2020-11-22 DIAGNOSIS — E861 Hypovolemia: Secondary | ICD-10-CM

## 2020-11-22 DIAGNOSIS — A419 Sepsis, unspecified organism: Secondary | ICD-10-CM

## 2020-11-22 DIAGNOSIS — E870 Hyperosmolality and hypernatremia: Secondary | ICD-10-CM

## 2020-11-22 DIAGNOSIS — Z66 Do not resuscitate: Secondary | ICD-10-CM

## 2020-11-22 DIAGNOSIS — D689 Coagulation defect, unspecified: Secondary | ICD-10-CM

## 2020-11-22 DIAGNOSIS — E785 Hyperlipidemia, unspecified: Secondary | ICD-10-CM

## 2020-11-22 DIAGNOSIS — Z87891 Personal history of nicotine dependence: Secondary | ICD-10-CM

## 2020-11-22 DIAGNOSIS — E119 Type 2 diabetes mellitus without complications: Secondary | ICD-10-CM

## 2020-11-22 DIAGNOSIS — J9503 Malfunction of tracheostomy stoma: Secondary | ICD-10-CM

## 2020-11-22 DIAGNOSIS — Z430 Encounter for attention to tracheostomy: Secondary | ICD-10-CM

## 2020-11-22 DIAGNOSIS — C7989 Secondary malignant neoplasm of other specified sites: Secondary | ICD-10-CM

## 2020-11-22 DIAGNOSIS — Z6838 Body mass index (BMI) 38.0-38.9, adult: Secondary | ICD-10-CM

## 2020-11-22 DIAGNOSIS — Z8579 Personal history of other malignant neoplasms of lymphoid, hematopoietic and related tissues: Secondary | ICD-10-CM

## 2020-11-22 DIAGNOSIS — J690 Pneumonitis due to inhalation of food and vomit: Secondary | ICD-10-CM

## 2020-11-22 DIAGNOSIS — G893 Neoplasm related pain (acute) (chronic): Secondary | ICD-10-CM

## 2020-11-22 DIAGNOSIS — E871 Hypo-osmolality and hyponatremia: Secondary | ICD-10-CM

## 2020-11-22 DIAGNOSIS — E875 Hyperkalemia: Secondary | ICD-10-CM

## 2020-11-22 DIAGNOSIS — Z638 Other specified problems related to primary support group: Secondary | ICD-10-CM

## 2020-11-22 DIAGNOSIS — J90 Pleural effusion, not elsewhere classified: Secondary | ICD-10-CM

## 2020-11-22 DIAGNOSIS — I82C13 Acute embolism and thrombosis of internal jugular vein, bilateral: Secondary | ICD-10-CM

## 2020-11-22 DIAGNOSIS — T451X5A Adverse effect of antineoplastic and immunosuppressive drugs, initial encounter: Secondary | ICD-10-CM

## 2020-11-22 DIAGNOSIS — F410 Panic disorder [episodic paroxysmal anxiety] without agoraphobia: Secondary | ICD-10-CM

## 2020-11-22 DIAGNOSIS — N185 Chronic kidney disease, stage 5: Secondary | ICD-10-CM

## 2020-11-22 DIAGNOSIS — T40605A Adverse effect of unspecified narcotics, initial encounter: Secondary | ICD-10-CM

## 2020-11-22 DIAGNOSIS — J9509 Other tracheostomy complication: Secondary | ICD-10-CM

## 2020-11-22 DIAGNOSIS — J95851 Ventilator associated pneumonia: Secondary | ICD-10-CM

## 2020-11-22 DIAGNOSIS — J156 Pneumonia due to other aerobic Gram-negative bacteria: Secondary | ICD-10-CM

## 2020-11-22 DIAGNOSIS — Z9114 Patient's other noncompliance with medication regimen: Secondary | ICD-10-CM

## 2020-11-22 DIAGNOSIS — E1122 Type 2 diabetes mellitus with diabetic chronic kidney disease: Secondary | ICD-10-CM

## 2020-11-22 DIAGNOSIS — Z931 Gastrostomy status: Secondary | ICD-10-CM

## 2020-11-22 DIAGNOSIS — Z9152 Personal history of nonsuicidal self-harm: Secondary | ICD-10-CM

## 2020-11-22 DIAGNOSIS — D611 Drug-induced aplastic anemia: Secondary | ICD-10-CM

## 2020-11-22 DIAGNOSIS — N179 Acute kidney failure, unspecified: Secondary | ICD-10-CM

## 2020-11-22 DIAGNOSIS — I120 Hypertensive chronic kidney disease with stage 5 chronic kidney disease or end stage renal disease: Secondary | ICD-10-CM

## 2020-11-22 DIAGNOSIS — J15212 Pneumonia due to Methicillin resistant Staphylococcus aureus: Secondary | ICD-10-CM

## 2020-11-22 DIAGNOSIS — E46 Unspecified protein-calorie malnutrition: Secondary | ICD-10-CM

## 2020-11-22 DIAGNOSIS — Z469 Encounter for fitting and adjustment of unspecified device: Secondary | ICD-10-CM

## 2020-11-22 DIAGNOSIS — E8881 Metabolic syndrome: Secondary | ICD-10-CM

## 2020-11-22 DIAGNOSIS — Z79899 Other long term (current) drug therapy: Secondary | ICD-10-CM

## 2020-11-22 DIAGNOSIS — Z781 Physical restraint status: Secondary | ICD-10-CM

## 2020-11-22 DIAGNOSIS — D693 Immune thrombocytopenic purpura: Secondary | ICD-10-CM

## 2020-11-22 DIAGNOSIS — E1165 Type 2 diabetes mellitus with hyperglycemia: Secondary | ICD-10-CM

## 2020-11-22 DIAGNOSIS — Z635 Disruption of family by separation and divorce: Secondary | ICD-10-CM

## 2020-11-22 DIAGNOSIS — E669 Obesity, unspecified: Secondary | ICD-10-CM

## 2020-11-22 DIAGNOSIS — F32A Depression, unspecified: Secondary | ICD-10-CM

## 2020-11-22 DIAGNOSIS — R001 Bradycardia, unspecified: Secondary | ICD-10-CM

## 2020-11-22 DIAGNOSIS — Z930 Tracheostomy status: Secondary | ICD-10-CM

## 2020-11-22 DIAGNOSIS — D62 Acute posthemorrhagic anemia: Secondary | ICD-10-CM

## 2020-11-22 DIAGNOSIS — C779 Secondary and unspecified malignant neoplasm of lymph node, unspecified: Secondary | ICD-10-CM

## 2020-11-22 DIAGNOSIS — Z794 Long term (current) use of insulin: Secondary | ICD-10-CM

## 2020-11-22 DIAGNOSIS — F411 Generalized anxiety disorder: Secondary | ICD-10-CM

## 2020-11-22 DIAGNOSIS — Z8589 Personal history of malignant neoplasm of other organs and systems: Secondary | ICD-10-CM

## 2020-11-22 DIAGNOSIS — J398 Other specified diseases of upper respiratory tract: Secondary | ICD-10-CM

## 2020-11-22 DIAGNOSIS — J9801 Acute bronchospasm: Secondary | ICD-10-CM

## 2020-11-22 DIAGNOSIS — C329 Malignant neoplasm of larynx, unspecified: Secondary | ICD-10-CM

## 2020-11-22 DIAGNOSIS — Z7984 Long term (current) use of oral hypoglycemic drugs: Secondary | ICD-10-CM

## 2020-11-22 DIAGNOSIS — R6521 Severe sepsis with septic shock: Secondary | ICD-10-CM

## 2020-11-22 DIAGNOSIS — D7582 Heparin induced thrombocytopenia (HIT): Secondary | ICD-10-CM

## 2020-11-22 DIAGNOSIS — R0603 Acute respiratory distress: Secondary | ICD-10-CM

## 2020-11-22 DIAGNOSIS — E11649 Type 2 diabetes mellitus with hypoglycemia without coma: Secondary | ICD-10-CM

## 2020-11-22 DIAGNOSIS — R590 Localized enlarged lymph nodes: Secondary | ICD-10-CM

## 2020-11-22 DIAGNOSIS — D6481 Anemia due to antineoplastic chemotherapy: Secondary | ICD-10-CM

## 2020-11-22 DIAGNOSIS — Z515 Encounter for palliative care: Secondary | ICD-10-CM

## 2020-11-22 LAB — VITAMIN B1, WHOLE BLOOD: VITAMIN B1, WHOLE BLOOD: 221 nmol/L — ABNORMAL HIGH (ref 78–185)

## 2020-11-22 MED ORDER — LORAZEPAM 2 MG/ML INJECTION SOLUTION
2 mg/mL | INTRAVENOUS | Status: DC | PRN
Start: 2020-11-22 — End: 2020-11-22

## 2020-11-22 MED ORDER — HYDROMORPHONE (DILAUDID) PCA 1 MG/ML (50 ML) YNH PYXIS
1 mg/ml | INTRAVENOUS | Status: DC
Start: 2020-11-22 — End: 2020-11-22
  Administered 2020-11-22: 21:00:00 1 mL/h via INTRAVENOUS

## 2020-11-22 MED ORDER — HYDROMORPHONE (DILAUDID) PCA 1 MG/ML (50 ML) YNH PYXIS
1 mg/ml | INTRAVENOUS | Status: DC
Start: 2020-11-22 — End: 2020-11-23

## 2020-11-22 MED ORDER — LORAZEPAM 2 MG/ML INJECTION SOLUTION
2 mg/mL | Freq: Once | INTRAVENOUS | Status: CP
Start: 2020-11-22 — End: ?
  Administered 2020-11-22: 21:00:00 2 mL via INTRAVENOUS

## 2020-11-22 MED ORDER — GLYCOPYRROLATE 0.2 MG/ML INJECTION SOLUTION
0.2 mg/mL | Freq: Four times a day (QID) | INTRAVENOUS | Status: DC
Start: 2020-11-22 — End: 2020-11-23
  Administered 2020-11-22 (×2): 0.2 mL via INTRAVENOUS

## 2020-11-22 MED ORDER — LORAZEPAM 2 MG/ML INJECTION SOLUTION
2 mg/mL | Freq: Once | INTRAVENOUS | Status: CP
Start: 2020-11-22 — End: ?
  Administered 2020-11-22: 20:00:00 2 mL via INTRAVENOUS

## 2020-11-22 MED ORDER — LORAZEPAM 2 MG/ML INJECTION SOLUTION
2 mg/mL | INTRAVENOUS | Status: DC | PRN
Start: 2020-11-22 — End: 2020-11-23

## 2020-11-22 MED ORDER — HYDROMORPHONE 2 MG/ML INJECTION SOLUTION
2 mg/mL | INTRAVENOUS | Status: DC | PRN
Start: 2020-11-22 — End: 2020-11-22

## 2020-11-22 MED ORDER — HYDROMORPHONE 2 MG/ML INJECTION SOLUTION
2 mg/mL | Freq: Once | INTRAVENOUS | Status: CP
Start: 2020-11-22 — End: ?
  Administered 2020-11-22: 20:00:00 2 mL via INTRAVENOUS

## 2020-11-22 MED ORDER — HYDROMORPHONE (DILAUDID) PCA 1 MG/ML (50 ML) YNH PYXIS
1 mg/ml | INTRAVENOUS | Status: DC
Start: 2020-11-22 — End: 2020-11-22

## 2020-11-22 MED ORDER — HYDROMORPHONE 2 MG/ML INJECTION SOLUTION
2 mg/mL | INTRAVENOUS | Status: DC | PRN
Start: 2020-11-22 — End: 2020-11-23

## 2020-11-22 NOTE — Plan of Care
Plan of Care Overview/ Patient Status     per family and MD, pt taken off ventilator by RT. Pt passed with CMO DNR orderes in place. Chaplin at the bed side. . DR Westley Hummer at bedside to pronounce. Comfort maintained.NED called, pt denied, #[id number]

## 2020-11-22 NOTE — Plan of Care
[facility]		Spiritual Care NotePurpose: Referral Source: Family Observation: People present/Information Obtained From: Sibling Emotional Mood: Patient Unconscious (Unresponsive) Quality of Relational Support: Familiy In-Town and Involved Types of Relational Support: Sibling, Friend   Subjective: Chaplain was called to provided spiritual support to family as their loved one getting disconnected to the vent. Patient sister and best friend were at bedside.  Chaplain engaged them in life review, invited them to express their feelings which will help to find closure. Chaplain also provided prayer, spiritual and emotional comfort.Spiritual Assessment:Referral Source: Family Information Obtained From: Sibling Mood: Patient Unconscious (Unresponsive)Relational SupportTypes of Relational Support: Sibling, Jacqulynn Cadet of Relational Support: Familiy In-Town and Involved Religious Affiliation: Ephriam Knuckles  Spiritual Resources: Family, Faith, God - Relationship, PrayerHelpful Religious Practices: Shared Prayer   Issues RaisedRaised by Patient: No Issues RaisedRaised by Family: Anticipatory Grief, Decision Making Difficulty, End-of-Life Decisions/Tasks, Coping with Losses Spiritual Interventions:Spiritual Intervention Index**Date of Spiritual Visit: 14-Apr-2022Visit Type: ConsultIntervention Type: Spiritual VisitResponding Chaplain: On-Call ChaplainConsulted With: Nurse, PhysicianReligious Needs: PrayerSpiritual/Religious Support Provided: Companionship, Prayer, Spiritual SupportFollow-Up Visit Needed: NoOutcome:     OUTCOMES: Spiritual/Emotional Resources Declined or Incomplete : Unknown Outcome  Plan: N/A   Total Consult Time: 45 minutes	Signed: Drucie Ip AchelusCasualWebb Silversmith [facility], BHYSC/SRC: [phone]: [phone] 04-14-226:53 PMPlan of Care Overview/ Patient Status

## 2020-11-22 NOTE — Plan of Care
Plan of Care Overview/ Patient Status    1900-0730 Patient appears comfortable. Opens eyes at times, but mostly sleeping. Dilaudid drip running as ordered. Continues on vent. ETS x 1 at this time for moderate amount of secretions. Foley draining yellow urine. No BM at this time. Patient turn and positioned for comfort. Safety maintained.

## 2020-11-22 NOTE — Progress Notes
[facility] Medicine Progress NoteAttending Provider: Pryor Ochoa, MD Subjective                                                                              Subjective: Interim History:  Patient obtundant this AM. Appears to be comfortable with no s/s of respiratory distress and pain. Plan is sister to come in today to pursue terminal weaning off the vent. Had a BM yesterday. Called Marjorie x 2 today with no answer. Waiting for sister to visit to pursue terminal discontinuation of the vent.ADDENDUM 3:58PM: [name] and friend at bedside prepared to go ahead with the terminal weaning process.     Review of Allergies/Meds/Hx: Review of Allergies/Meds/Hx:I have reviewed the patient's: allergies, current scheduled medications, current infusions, current prn medications and past medical history Objective Objective: Vitals:I have reviewed the patient's current vital signs as documented in the patient's EMR.   and Last 24 hours: Temp:  [98.8 ?F (37.1 ?C)-99.9 ?F (37.7 ?C)] 98.8 ?F (37.1 ?C)Pulse:  [100-108] 100Resp:  [16-24] 16BP: (99)/(61) 99/61SpO2:  [96 %-98 %] 98 %I/O's:I have reviewed the patient's current I&O's as documented in the EMR.Gross Totals (Last 24 hours) at 12/07/20 0759Last data filed at Dec 07, 2020 0637Intake 1254.62 ml Output 1025 ml Net 229.62 ml Procedures:None Physical Exam Physical ExamConstitutional:     Appearance: He is ill-appearing. Sleeping, appears to be comfortable. Not arousable. Neck:    Comments: Large neck mass with peristomal necrosis and apparent extension distally with pink nodule nearly at sternal notch. Increased dark secretions oozing over onto to the chest. Cardiovascular:    Rate and Rhythm: sinus tachycardia and regular rhythm.    Heart sounds: Normal heart sounds. Pulmonary:    Comments: Coarse vented breath sounds anteriorly. Decreased  Abdomino-thoracic breathing Abdominal:    General: Bowel sounds are normal. There is no distension.    Palpations: Abdomen is soft.    Tenderness: There is no abdominal tenderness.    Comments: g-tube without surrounding erythema or edema Musculoskeletal:    Comments: Edematous extremities  Neurological:    Mental Status: unable to assess.    Comments:  unable to follow simple instructions.Unable to assess orientation Labs:I have reviewed the patient's labs within the last 24 hrs.Last 24 hours: No results found for this or any previous visit (from the past 24 hour(s)).Diagnostics:No new radiology.ECG/Tele Events: No ECG ordered today Assessment Assessment: Assessment:Arthur [name] is a 55 y.o. 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 He was found with progression of malignancy with encroachment of tumor on his trachea. There was concern for obstruction at [facility] so he was transferred to ENT at [facility] for trach management. Scope notable for partial distal obstruction. On day 5 of admission he had respiratory distress requiring ventilator and SICU transfer. SICU course complicated by incidental R IJ thrombus at port, MRSA VAP, urinary retention, and multiple RRTs for desat 2/2 trach occlusion/obstruction and mucous plugging. Transferred to pulm stepdown for vent weaning 11/13/2020. After GOC discussions on 11/17/2020 and due to decline of his clinical status patient now CMO. Currently in  hospice at this time. Principal Problem:  Laryngeal cancer (HC Code) (HC CODE)  SNOMED [place](R): MALIGNANT TUMOR OF LARYNX  Active Problems:  Chronic ITP (idiopathic thrombocytopenia) (HC Code) (HC CODE)  SNOMED [place](R): CHRONIC IDIOPATHIC THROMBOCYTOPENIC PURPURA    Newly diagnosed venous thromboembolism (VTE)  SNOMED [place](R): THROMBOEMBOLISM OF VEIN    Difficult airway  SNOMED [place](R): EXPECTED DIFFICULT TRACHEAL INTUBATION    Acute hypoxemic respiratory failure (HC Code) (HC CODE)  SNOMED (R): ACUTE HYPOXEMIC RESPIRATORY FAILURE   Plan Plan: Acute on Chronic hypoxemic respiratory FailureAirway obstruction from laryngeal SCCTumor invasion of stoma with partial distal obstruction Mucous plugging 2/2 tumor necrosisPERTINENT TRACH INFORMATIONService who placed trach: ENTDate of placement:   PTA Date of last routine trach change:  Airway 11/11/20 1100 tracheostomy-Date Tracheostomy Changed: 11/11/20 Type of trach:    #8 hyperflex bivona to bypass obstruction. Eventual plan is custom bivona# CMO- continue suctioning prn - Vent settings to be maintained with PEEP of 12, Flow to 90 per pulmonary to assist in patient overcoming the obstruction of the tumor and now to maintain comfort. - discontinued on antibiotics- discontinued PSV trials at this time- d/c tube feeds at this time- continue on dilaudid continuous drip due to increasing respiratory distress and Rapid RR. - continue ativan 2mg  to Q4h scheduled and continuing klonapin 0.5mg  BID via J tube- dilaudid 1mg  q3h scheduled for pain control and respiratory distress, along with prn dilaudid 1mg  q3h - miralax and senna BID scheduled - tylenol, albuterol nebs and ativan prn  - 24 hour visitation. - restraints discontinued as patient no longer pulling on lines. - continue robitussin scheduled to decrease his bronchospasms and coughing. 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 - S/p palliative radiation x 6, decadron 2/22-2/24- ENT -> tumor board -> no curative or meaningful surgical options at this time.  - palliative care on board-> GOC discussions with sister on 3/16 resulted in transitioning patient to CMO/inpatient hospice. ??Anxiety / Depression- Clonazepam 0.5mg  BID- Ativan 1mg  IV Q4h PRN for agitation and respiratory distress - Appreciate palliative care and psych consults\-  Increased IV ativan to 2mg  q6h scheduledPlan for terminal ventilator removal today: Removal of Mechanical Ventilation Checklist:Step 1: Decision and DocumentationPrognosis, options and goals of care have been fully explored with the patient/ legal surrogates/ family.Consensus has been reached that removal of mechanical ventilation with expected death is the optimal treatment course.We have also discussed withdrawal of artificial hydration, blood pressure support, ET tune after ventilator is discontinued, wishes concerning organ donation. When prolonged survival is expected (>2 hours), plan and options for ongoing support have been discussed.Step 2: PreparationA senior physician- attending, ICU fellow or senior resident will be available before, during and immediately after removal of mechanical ventilation to supervise symptom control and provide counseling/support to the family and staff.Write order to discontinue ventilatorNotify respiratory therapist.Ask if they would like chaplain to visit, and if the family would like to be present before, during or after removal of mechanical ventilation.Provide premedication for sedation/dyspnea:e.g bolus dose of morphine 2mg -10mg  IV and start a continuous infusion at 50% of the bolus dose (fentanyl or hydromorphone are acceptable alternatives).e.g 1-2 mg of midazolam or lorazepam bolus dose prior to extubation.?Provide premedication for respiratory secretionsglycopyrrolate 100 mcg iv once also prior to extubation (alternate agent?in?case?of?shortage scopolamine 400 mcg iv)You may repeat doses?of?lorazepam and morphine (or alternate agent) q15 mins following extubation if patient?with?agitation or restlessness or air hunger- You may repeat  glycopyrrolate?in?1hr if excessive secretionsIt is Ok to titrate meds to minimize anxiety and achieve desired state of sedation prior to extubation. (Note-the primary goal of sedation is to prevent dyspnea post extubation; unintentional apnea following sedative administration may occur, but in general, if all parties are agreed upon the plan of care, a decision to continue with ventilator withdrawal is appropriate)Have additional medication drawn up and ready to administer at the bedside if needed to provide symptom relief.Prepare space at the bedside for family membersStep 3: removal and follow upAsk respiratory therapist to silence ventilator alarms and set FiO2 to 21% and remove peep. Observe for signs of respiratory distress and adjust medication.Then, extubate and remove ventilator from the bedside. Observe and titrate medications as neededPlease discontinue labs, x-rays, monitoring within room, change vitals to [facility] notify any relevant consult services?of?change?of?status to comfort care onlyIf the patient survives overnight and not imminently dying, can discuss transfer to virtual hospice team.??Diet: TF no tray (nepro for hyperkalemia)Code Status: No Code/DNR/No ACLS/CMO ?Electronically Signed:Keedan Sample Westley Hummer, MD 11/07/2020,

## 2020-11-22 NOTE — Progress Notes
[facility] Medicine Progress NoteAttending Provider: Pryor Ochoa, MD Subjective                                                                              Subjective: Interim History: Patient appeared to be breathing more comfortably. Overnight and this AM, per nurse, stated he was anxious and attempting to get out of bed. He was given additional doses of ativan and dilaudid IV overnight. This AM, he is not able to follow commands and is not arousable. Arthur [name] and patient's 2 friends visited today at the bedside. Initially, was planned for patient's terminal extubation today, but due to Arthur [name] situation with her son - who is also in hospice- needs to return home. Requesting tomorrow after church to have the vent removal done. Discussed with Arthur [name] about d/c the tube feeds and insulin as patient is becoming constipated with increased secretions along with hyperglycemia, which could make him more uncomfortable. Agreed with D/C tube feeds at this time. Sister also requested a chaplain to the bedside for blessings and prayers to be given to her brother.  Review of Allergies/Meds/Hx: Review of Allergies/Meds/Hx:I have reviewed the patient's: allergies, current scheduled medications, current infusions, current prn medications and past medical history Objective Objective: Vitals:I have reviewed the patient's current vital signs as documented in the patient's EMR.   and Last 24 hours: Temp:  [99.9 ?F (37.7 ?C)-101.6 ?F (38.7 ?C)] 99.9 ?F (37.7 ?C)Pulse:  [102-113] 104Resp:  [18-28] 18BP: (99-117)/(61-62) 99/61SpO2:  [95 %-98 %] 97 %I/O's:I have reviewed the patient's current I&O's as documented in the EMR.[name] Totals (Last 24 hours) at 11/21/2020 1440Last data filed at 11/21/2020 0542Intake 740 ml Output 1900 ml Net -1160 ml Procedures:None Physical Exam Physical ExamConstitutional:     Appearance: He is ill-appearing. Sleeping, appears to be comfortable. Not arousable. Neck:    Comments: Large neck mass with peristomal necrosis and apparent extension distally with pink nodule nearly at sternal notch. Increased dark secretions oozing over onto to the chest. Cardiovascular:    Rate and Rhythm: sinus tachycardia and regular rhythm.    Heart sounds: Normal heart sounds. Pulmonary:    Comments: Coarse vented breath sounds anteriorly. Decreased  Abdomino-thoracic breathing Abdominal:    General: Bowel sounds are normal. There is no distension.    Palpations: Abdomen is soft.    Tenderness: There is no abdominal tenderness.    Comments: g-tube without surrounding erythema or edema Musculoskeletal:    Comments: Edematous extremities  Neurological:    Mental Status: unable to assess.    Comments:  unable to follow simple instructions.Unable to assess orientation Labs:I have reviewed the patient's labs within the last 24 hrs.Last 24 hours: Recent Results (from the past 24 hour(s)) POC Glucose (Fingerstick)  Collection Time: 11/20/20  5:24 PM Result Value Ref Range  Glucose, Meter 367 (H) 70 - 100 mg/dL POCT Glucose  Collection Time: 11/21/20 12:04 AM Result Value Ref Range  Glucose, Meter 391 (H) 70 - 100 mg/dL POCT Glucose  Collection Time: 11/21/20  5:05 AM Result Value Ref Range  Glucose, Meter 440 (HH) 70 - 100 mg/dL Diagnostics:No new radiology.ECG/Tele Events: No ECG ordered today Assessment Assessment: Assessment:Arthur [name] is a 55 y.o. 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 He was found with progression of malignancy with encroachment of tumor on his trachea. There was concern for obstruction at [facility] so he was transferred to ENT at [facility] for trach management. Scope notable for partial distal obstruction. On day 5 of admission he had respiratory distress requiring ventilator and SICU transfer. SICU course complicated by incidental R IJ thrombus at port, MRSA VAP, urinary retention, and multiple RRTs for desat 2/2 trach occlusion/obstruction and mucous plugging. Transferred to pulm stepdown for vent weaning 11/13/2020. After GOC discussions on 11/17/2020 and due to decline of his clinical status patient now CMO. Currently in hospice at this time. Principal Problem:  Laryngeal cancer (HC Code) (HC CODE)  SNOMED [place](R): MALIGNANT TUMOR OF LARYNX  Active Problems:  Chronic ITP (idiopathic thrombocytopenia) (HC Code) (HC CODE)  SNOMED [place](R): CHRONIC IDIOPATHIC THROMBOCYTOPENIC PURPURA    Newly diagnosed venous thromboembolism (VTE)  SNOMED [place](R): THROMBOEMBOLISM OF VEIN    Difficult airway  SNOMED [place](R): EXPECTED DIFFICULT TRACHEAL INTUBATION    Acute hypoxemic respiratory failure (HC Code) (HC CODE)  SNOMED [place](R): ACUTE HYPOXEMIC RESPIRATORY FAILURE   Plan Plan: Acute on Chronic hypoxemic respiratory FailureAirway obstruction from laryngeal SCCTumor invasion of stoma with partial distal obstruction Mucous plugging 2/2 tumor necrosisPERTINENT TRACH INFORMATIONService who placed trach: ENTDate of placement:   PTA Date of last routine trach change:  Airway 11/11/20 1100 tracheostomy-Date Tracheostomy Changed: 11/11/20 Type of trach:    #8 hyperflex bivona to bypass obstruction. Eventual plan is custom bivona# CMO- continue suctioning prn - Vent settings to be maintained with PEEP of 12, Flow to 90 per pulmonary to assist in patient overcoming the obstruction of the tumor and now to maintain comfort. - discontinued on antibiotics- discontinued PSV trials at this time- d/c tube feeds at this time- started on dilaudid continuous drip due to increasing respiratory distress and Rapid RR. - increased ativan 2mg  to Q4h scheduled and continuing klonapin 0.5mg  BID via J tube- dilaudid 1mg  q3h scheduled for pain control and respiratory distress, along with prn dilaudid 1mg  q3h - miralax and senna BID scheduled - tylenol, albuterol nebs and ativan prn  - plan is for possible discontinuation of vent and tube feeds in the next few days once family is able to come in. - 24 hour visitation. - attempting to provide patient comfort to be able to remove the restraints. - continue robitussin scheduled to decrease his bronchospasms and coughing. 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 - S/p palliative radiation x 6, decadron 2/22-2/24- ENT -> tumor board -> no curative or meaningful surgical options at this time.  - palliative care on board-> GOC discussions with sister on 3/16 resulted in transitioning patient to CMO/inpatient hospice. ??Anxiety / Depression- Clonazepam 0.5mg  BID- Ativan 1mg  IV Q4h PRN for agitation and respiratory distress - Appreciate palliative care and psych consults\-  Increased IV ativan to 2mg  q6h scheduled??Diet: TF no tray (nepro for hyperkalemia)Code Status: No Code/DNR/No ACLS/CMO ?Electronically Signed:Lindsey Hommel Westley Hummer, MD 11/21/2020,

## 2020-11-22 NOTE — Discharge Summary
[facility]    Med/Surg Discharge Summary    Patient Data:    Patient Name: Arthur [name] Admit date: 10/09/2020   Age: 55 y.o. Discharge date: 12/17/2020   DOB: Sep 26, 1965  Discharge Attending Physician: Pryor Ochoa, MD    MRN: [id number]  Discharged Condition: expired   PCP: Christella Scheuermann, PA Disposition: Expired     Principal Diagnosis: metastatic laryngeal SCC   Secondary Diagnoses:        Issues to be Addressed Post Discharge:     Issues to be Addressed Post Discharge:  1.   2.   3.     Relevant Medications on Discharge:  None    .    Pending Labs and Tests:   Pending Lab Results     Order Current Status    Partial thromboplastin time Collected (11/11/20 4259)          Follow-up Information:  No follow-up provider specified.     No future appointments.    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 He was found with progression of malignancy with encroachment of tumor on his trachea.?There was concern for obstruction at [facility] so he was transferred to ENT at [facility] for trach management. Scope notable for partial distal obstruction. On day 5 of admission he had respiratory distress requiring ventilator and SICU transfer. SICU course complicated by incidental?R IJ thrombus?at port, MRSA VAP, urinary retention, and multiple RRTs for desat 2/2 trach occlusion/obstruction and mucous plugging. Transferred to pulm stepdown for vent weaning 11/13/2020. After GOC discussions on 11/17/2020 and due to decline of his clinical status patient transitioned to hospice/CMO.   Patient was provided palliative/comfort during CMO. He was planned for palliative terminal extubation on 12-17-2020. Sister and friend of patient at the bedside. Respiratory, chaplain, Nurse, PA and myself present. Patient was premedicated and underwent terminal extubation on 3/20. At 5:12PM, patient was pronounced dead.   Family refused autopsy.     Inpatient Consultants and summary of recommendations:      Pertinent Procedures or Surgeries:       Pertinent lab findings and test results:   Objective:     Recent Labs   Lab 11/16/20  0507 11/16/20  0507 11/17/20  0515   WBC 10.3  --  11.6*   HGB 8.5*   < > 7.0*   HCT 28.60*   < > 23.20*   PLT 211   < > 209    < > = values in this interval not displayed.    Recent Labs   Lab 11/16/20  0507 11/17/20  0515   NEUTROPHILS 77.3* 79.1*      Recent Labs   Lab 11/16/20  0507 11/16/20  1347 11/17/20  0515 11/17/20  0523 11/21/20  0505   NA 146*  --  148*  --   --    K 4.8  --  4.4  --   --    CL 109*  --  111*  --   --    CO2 28  --  28  --   --    BUN 67*  --  70*  --   --    CREATININE 1.85*  --  2.12*  --   --    GLU 226*   < >  237*   < > 440*   ANIONGAP 9  --  9  --   --     < > = values in this interval not displayed.    Recent Labs   Lab 11/16/20  0507 11/16/20  0507 11/17/20  0515   CALCIUM 10.6*  --  10.1   MG 2.4   < > 2.3   PHOS 3.9  --   --     < > = values in this interval not displayed.      No results for input(s): ALT, AST, ALKPHOS, BILITOT, BILIDIR in the last 168 hours. Recent Labs   Lab 11/16/20  0507 11/17/20  0836   PTT 45.0* 45.3*        Culture Information:  No results for input(s): LABBLOO, LABURIN, LOWERRESPIRA in the last 168 hours.    Imaging:   n/a     Diet:  n./a  Mobility: Highest Level of mobility - ACTUAL: Mobillty Level 1, Lying in bed , AM PAC < 6  Physical Therapy Disposition Recommendation: Rehab at [facility]           Physical Exam     Discharge vitals:   Temp:  [98.8 ?F (37.1 ?C)-99.7 ?F (37.6 ?C)] 98.8 ?F (37.1 ?C)  Pulse:  [100-108] 100  Resp:  [16] 16  SpO2:  [96 %-98 %] 98 %   Pertinent Findings of Physical Exam: as mentioned below  Cognitive Status at Discharge: n/a       Discharge Physical Exam:  Physical Exam  General: unresponsive pale  HEENT: non reactive, fixed.  CVS: No electrical activity.  Resp: No breath sounds.  Abd: Soft, no bowel movements.  Extrem: Cold extremities.    Allergies   No Known Allergies     PMH PSH   Past Medical History:   Diagnosis Date   ? Diabetes mellitus (HC Code) (HC CODE)    ? Hypercholesteremia    ? Hypertension       No past surgical history on file.     Social History Family History   Social History     Tobacco Use   ? Smoking status: Former Smoker     Packs/day: 1.00     Years: 30.00     Pack years: 30.00     Quit date: 03/04/2013     Years since quitting: 7.7   Substance Use Topics   ? Alcohol use: Yes     Alcohol/week: 14.0 standard drinks     Types: 14 Cans of beer per week      Family History   Problem Relation Age of Onset   ? Aneurysm Mother           Discharge Medications:   N/a     There was at total of approximately 70 minutes spent on the discharge of this patient    Electronically Signed:  Pryor Ochoa, MD   11/29/20   6:51 PM

## 2020-11-22 NOTE — Plan of Care
[facility]	Spiritual Care NotePurpose: Referral Source: Nurse, Family Observation: People present/Information Obtained From: Sibling, Friend Emotional Mood: Unable to assess Quality of Relational Support: Family Out-of-Town but Involved Types of Relational Support: Family, Sibling, Friend Subjective: Pt not alert or responsive at time of visit. Provided support to the pt's two roommates and his sister [name]; Arthur [name]'s 86 year old son is currently in hospice care and she expressed being torn between being with her brother and being with her son.[name] decided to delay extubation until she could be present alone with her brother tomorrow afternoon. I provided empathetic listening and a place for all three of Arthur [name]'s loved ones to to process and express their emotions. I offered prayer at Arthur [name]'s request.Pt's family aware of chaplain availability. Spiritual Assessment:Referral Source: Nurse, Family Information Obtained From: Sibling, Friend Mood: Unable to assessRelational SupportTypes of Relational Support: Family, Sibling, FriendQuality of Relational Support: Family Out-of-Town but Involved Religious Affiliation: Curator  Spiritual Resources: Prayer, Consulting civil engineer Religious Practices: Shared PrayerIssues RaisedRaised by Patient: No Issues RaisedRaised by Family: End-of-Life Decisions/Tasks, Coping with Losses, Anticipatory Grief, Concern about Patient's Outcome Spiritual Interventions:Spiritual Intervention Index**Date of Spiritual Visit: 03/19/22Visit Type: Initial VisitLanguage or special accommodation rendered?: NoHow Well Do You Speak English?: very wellDo You Speak a Language Other Than English at Home?: noIntervention Type: Spiritual VisitResponding Chaplain: Resident, On-Call ChaplainSpiritual/Religious Support Provided: Family Support, PrayerFollow-Up Visit Needed: NoOutcome: OUTCOMES: Expressed Spiritual/Religious Resources or Distress: Utilized spiritual resources/practices OUTCOMES: Progressed Spiritually: Established chaplain relationship, Received desired ritual or practice Plan: Family aware of chaplain availability. Emotional and/or spiritual support is available to patients, families, and staff from the Department of Spiritual Care 24/7 via request or referral.  Total Consult Time: 71 minutes	Signed: Levester Fresh, MDiv (they/them)Chaplain Resident [facility]: [phone] of Spiritual CareOn-Call/Off-Shift: [phone]: [phone]: [phone]/19/20229:06 PM

## 2020-12-04 NOTE — Death Pronouncement
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 General: unresponsive paleHEENT: non reactive, fixed.CVS: No electrical activity.Resp: No breath sounds.Abd: Soft, no bowel movements.Extrem: Cold extremities.Family updated and consoled at bedside.Time of death: 5:12PMDeath Certificate filled.Family refused Autopsy.Medical Examine does not need to be contacted per policy.[employer] contacted.Electronically Signed by Pryor Ochoa, MD, Apr 19, 2022Determination of Death    Most Recent Value Prerequisite  Coma or Unresponsiveness Absent blink to visual threat Absent pupillary response Absent eye movements Absent corneal reflexes Absent pharyngeal and tracheal reflexes APNEA  TESTING Apnea Prerequisute Apnea Testing Checklist Apnea testing results Ancillary testing is required when Prerequisite items are met but either Brainstem Reflex Testing or Apnea Testing cannot be completed or confidently interpreted.. Summary of Findings Date of Actual Death 12-22-20 Time of Actual Death 25-Mar-1711  Arthur [name].Arthur [name].Hospitalist Service Beeper: [id number]: [phone]/20/2022

## 2020-12-04 DEATH — deceased
